# Patient Record
Sex: Female | Born: 1975
Health system: Southern US, Community
[De-identification: ages and names within clinical notes are randomized; demographics above are authoritative.]

## PROBLEM LIST (undated history)

## (undated) DIAGNOSIS — R202 Paresthesia of skin: Secondary | ICD-10-CM

## (undated) DIAGNOSIS — M2550 Pain in unspecified joint: Secondary | ICD-10-CM

## (undated) DIAGNOSIS — M7989 Other specified soft tissue disorders: Secondary | ICD-10-CM

## (undated) DIAGNOSIS — E785 Hyperlipidemia, unspecified: Secondary | ICD-10-CM

## (undated) DIAGNOSIS — M549 Dorsalgia, unspecified: Secondary | ICD-10-CM

## (undated) DIAGNOSIS — N979 Female infertility, unspecified: Secondary | ICD-10-CM

## (undated) DIAGNOSIS — N048 Nephrotic syndrome with other morphologic changes: Secondary | ICD-10-CM

## (undated) DIAGNOSIS — G4730 Sleep apnea, unspecified: Secondary | ICD-10-CM

## (undated) DIAGNOSIS — I10 Essential (primary) hypertension: Secondary | ICD-10-CM

## (undated) DIAGNOSIS — K760 Fatty (change of) liver, not elsewhere classified: Secondary | ICD-10-CM

## (undated) DIAGNOSIS — F329 Major depressive disorder, single episode, unspecified: Secondary | ICD-10-CM

## (undated) DIAGNOSIS — T7840XA Allergy, unspecified, initial encounter: Secondary | ICD-10-CM

## (undated) DIAGNOSIS — F439 Reaction to severe stress, unspecified: Secondary | ICD-10-CM

## (undated) DIAGNOSIS — M791 Myalgia, unspecified site: Secondary | ICD-10-CM

## (undated) DIAGNOSIS — R569 Unspecified convulsions: Secondary | ICD-10-CM

## (undated) DIAGNOSIS — D649 Anemia, unspecified: Secondary | ICD-10-CM

## (undated) DIAGNOSIS — G43909 Migraine, unspecified, not intractable, without status migrainosus: Secondary | ICD-10-CM

## (undated) DIAGNOSIS — R7303 Prediabetes: Secondary | ICD-10-CM

## (undated) DIAGNOSIS — B029 Zoster without complications: Secondary | ICD-10-CM

## (undated) DIAGNOSIS — K5900 Constipation, unspecified: Secondary | ICD-10-CM

## (undated) DIAGNOSIS — N289 Disorder of kidney and ureter, unspecified: Secondary | ICD-10-CM

## (undated) DIAGNOSIS — F32A Depression, unspecified: Secondary | ICD-10-CM

## (undated) DIAGNOSIS — R200 Anesthesia of skin: Secondary | ICD-10-CM

## (undated) DIAGNOSIS — R0602 Shortness of breath: Secondary | ICD-10-CM

## (undated) DIAGNOSIS — M1990 Unspecified osteoarthritis, unspecified site: Secondary | ICD-10-CM

## (undated) DIAGNOSIS — K219 Gastro-esophageal reflux disease without esophagitis: Secondary | ICD-10-CM

## (undated) DIAGNOSIS — E039 Hypothyroidism, unspecified: Secondary | ICD-10-CM

## (undated) DIAGNOSIS — E0590 Thyrotoxicosis, unspecified without thyrotoxic crisis or storm: Secondary | ICD-10-CM

## (undated) DIAGNOSIS — F419 Anxiety disorder, unspecified: Secondary | ICD-10-CM

## (undated) DIAGNOSIS — Z91018 Allergy to other foods: Secondary | ICD-10-CM

## (undated) HISTORY — DX: Disorder of kidney and ureter, unspecified: N28.9

## (undated) HISTORY — DX: Depression, unspecified: F32.A

## (undated) HISTORY — PX: KNEE SURGERY: SHX244

## (undated) HISTORY — DX: Allergy to other foods: Z91.018

## (undated) HISTORY — PX: OTHER SURGICAL HISTORY: SHX169

## (undated) HISTORY — DX: Constipation, unspecified: K59.00

## (undated) HISTORY — PX: TYMPANOSTOMY TUBE PLACEMENT: SHX32

## (undated) HISTORY — DX: Hypothyroidism, unspecified: E03.9

## (undated) HISTORY — DX: Anemia, unspecified: D64.9

## (undated) HISTORY — DX: Gastro-esophageal reflux disease without esophagitis: K21.9

## (undated) HISTORY — DX: Anesthesia of skin: R20.2

## (undated) HISTORY — DX: Reaction to severe stress, unspecified: F43.9

## (undated) HISTORY — DX: Fatty (change of) liver, not elsewhere classified: K76.0

## (undated) HISTORY — DX: Essential (primary) hypertension: I10

## (undated) HISTORY — PX: WRIST SURGERY: SHX841

## (undated) HISTORY — DX: Dorsalgia, unspecified: M54.9

## (undated) HISTORY — DX: Sleep apnea, unspecified: G47.30

## (undated) HISTORY — DX: Other specified soft tissue disorders: M79.89

## (undated) HISTORY — DX: Nephrotic syndrome with other morphologic changes: N04.8

## (undated) HISTORY — DX: Major depressive disorder, single episode, unspecified: F32.9

## (undated) HISTORY — DX: Zoster without complications: B02.9

## (undated) HISTORY — DX: Prediabetes: R73.03

## (undated) HISTORY — DX: Pain in unspecified joint: M25.50

## (undated) HISTORY — DX: Myalgia, unspecified site: M79.10

## (undated) HISTORY — DX: Anesthesia of skin: R20.0

## (undated) HISTORY — DX: Female infertility, unspecified: N97.9

## (undated) HISTORY — PX: COLONOSCOPY: SHX174

## (undated) HISTORY — DX: Anxiety disorder, unspecified: F41.9

## (undated) HISTORY — PX: RENAL BIOPSY: SHX156

## (undated) HISTORY — DX: Hyperlipidemia, unspecified: E78.5

## (undated) HISTORY — DX: Unspecified convulsions: R56.9

## (undated) HISTORY — PX: FINGER SURGERY: SHX640

## (undated) HISTORY — DX: Allergy, unspecified, initial encounter: T78.40XA

## (undated) HISTORY — DX: Shortness of breath: R06.02

---
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
## 2013-01-15 ENCOUNTER — Ambulatory Visit (INDEPENDENT_AMBULATORY_CARE_PROVIDER_SITE_OTHER): Payer: BC Managed Care – PPO | Admitting: Emergency Medicine

## 2013-01-15 VITALS — BP 120/82 | HR 73 | Temp 98.2°F | Resp 16 | Ht 68.8 in | Wt 278.0 lb

## 2013-01-15 DIAGNOSIS — R059 Cough, unspecified: Secondary | ICD-10-CM

## 2013-01-15 DIAGNOSIS — J209 Acute bronchitis, unspecified: Secondary | ICD-10-CM

## 2013-01-15 DIAGNOSIS — J029 Acute pharyngitis, unspecified: Secondary | ICD-10-CM

## 2013-01-15 DIAGNOSIS — R05 Cough: Secondary | ICD-10-CM

## 2013-01-15 MED ORDER — AZITHROMYCIN 250 MG PO TABS: ORAL_TABLET | ORAL | Status: DC

## 2013-01-15 MED ORDER — BENZONATATE 100 MG PO CAPS: 100.0000 mg | ORAL_CAPSULE | Freq: Three times a day (TID) | ORAL | Status: DC | PRN

## 2013-01-15 NOTE — Patient Instructions (Signed)

## 2013-01-15 NOTE — Progress Notes (Addendum)
Subjective:    Patient ID: Melanie [name], female    DOB: Feb 20, 1976, 37 y.o.   MRN: [id number]  HPI This chart was scribed for  by Ladona Ridgel Day, Scribe. This patient was seen in room  and the patient's care was started at 8:11 AM.  HPI Comments: Melanie [name] is a 37 y.o. female who presents to the [facility] complaining of a constant, gradually worsening cough and congestion, onset 10 days ago. She reports that her symptoms began with a mild sore throat and cough but worsened into nasal congestion and a productive cough. She states subjective fever. She reports taking tylenol cold. She states sick contacts with co workers and family. She teaches english to adult refugees.  She states that she usually gets seasonal allergy symptoms but this episode of congestion/cough seems worse than normal.   She is not a smoker. Allergic to sulfa, relafen and celebrex.     Past Medical History  Diagnosis Date  . Allergy   . Anxiety   . Depression   . Chronic kidney disease     History reviewed. No pertinent past surgical history.  Family History  Problem Relation Age of Onset  . Diabetes Mother   . Diabetes Maternal Grandmother   . Diabetes Paternal Grandmother   . Cancer Paternal Grandmother     History   Social History  . Marital Status: Married    Spouse Name: N/A    Number of Children: N/A  . Years of Education: N/A   Occupational History  . Not on file.   Social History Main Topics  . Smoking status: Passive Smoke Exposure - Never Smoker  . Smokeless tobacco: Not on file  . Alcohol Use: No  . Drug Use: No  . Sexual Activity: Not on file   Other Topics Concern  . Not on file   Social History Narrative  . No narrative on file    Allergies  Allergen Reactions  . Relafen [Nabumetone] Other (See Comments)    migraine  . Celebrex [Celecoxib] Rash  . Sulfa Antibiotics Rash    There are no active problems to display for this patient.   No results  found for this or any previous visit.  No diagnosis found.  Meds ordered this encounter  Medications  . DULoxetine (CYMBALTA) 30 MG capsule    Sig: Take 30 mg by mouth daily.    BP 120/82  Pulse 73  Temp(Src) 98.2 F (36.8 C) (Oral)  Resp 16  Ht 5' 8.8" (1.748 m)  Wt 278 lb (126.1 kg)  BMI 41.27 kg/m2  SpO2 95%  LMP 01/01/2013        Review of Systems  HENT: Positive for congestion.   Respiratory: Positive for cough. Negative for shortness of breath.   Gastrointestinal: Negative for abdominal pain.       Objective:   Physical Exam  Nursing note and vitals reviewed. Constitutional: She is oriented to person, place, and time. She appears well-developed and well-nourished. No distress.  HENT:  Head: Normocephalic and atraumatic.  Neck: Neck supple. No tracheal deviation present.  Cardiovascular: Normal rate.   Pulmonary/Chest: Effort normal. No respiratory distress.  Musculoskeletal: Normal range of motion.  Neurological: She is alert and oriented to person, place, and time.  Skin: Skin is warm and dry.  Psychiatric: She has a normal mood and affect. Her behavior is normal.   Are clear. Nose is congested throat is red neck supple chest was clear to auscultation and  percussion  Results for orders placed in visit on 01/15/13  POCT RAPID STREP A (OFFICE)      Result Value Range   Rapid Strep A Screen Negative  Negative       Assessment & Plan:  Strep screen was done. We'll treat with a Z-Pak Mucinex Tessalon Perles

## 2013-01-20 ENCOUNTER — Encounter: Payer: Self-pay | Admitting: Emergency Medicine

## 2013-03-17 ENCOUNTER — Ambulatory Visit (INDEPENDENT_AMBULATORY_CARE_PROVIDER_SITE_OTHER): Payer: BC Managed Care – PPO | Admitting: Emergency Medicine

## 2013-03-17 VITALS — BP 122/90 | HR 96 | Temp 98.0°F | Resp 16 | Ht 68.0 in | Wt 279.0 lb

## 2013-03-17 DIAGNOSIS — H66009 Acute suppurative otitis media without spontaneous rupture of ear drum, unspecified ear: Secondary | ICD-10-CM

## 2013-03-17 DIAGNOSIS — J018 Other acute sinusitis: Secondary | ICD-10-CM

## 2013-03-17 DIAGNOSIS — J209 Acute bronchitis, unspecified: Secondary | ICD-10-CM

## 2013-03-17 MED ORDER — AMOXICILLIN-POT CLAVULANATE 875-125 MG PO TABS: 1.0000 | ORAL_TABLET | Freq: Two times a day (BID) | ORAL | Status: DC

## 2013-03-17 MED ORDER — PROMETHAZINE-CODEINE 6.25-10 MG/5ML PO SYRP: 5.0000 mL | ORAL_SOLUTION | Freq: Four times a day (QID) | ORAL | Status: DC | PRN

## 2013-03-17 MED ORDER — PSEUDOEPHEDRINE-GUAIFENESIN ER 60-600 MG PO TB12: 1.0000 | ORAL_TABLET | Freq: Two times a day (BID) | ORAL | Status: DC

## 2013-03-17 NOTE — Progress Notes (Signed)
[facility] [address] [place] [address] [phone]  Date:  03/17/2013   Name:  Melanie [name]   DOB:  1975-11-30   MRN:  [id number]  PCP:  Kennon Portela, MD    Chief Complaint: Sinusitis   History of Present Illness:  Melanie [name] is a 38 y.o. very pleasant female patient who presents with the following:  Ill for past few days after travel to [place] for the holidays.  Has feeling of "fever" with normal temps of 96.  Has nasal congestion and drainage that is purulent in nature with congestion in ears and cough productive of purulent sputum.  No wheezing or shortness of breath.  No nausea or vomiting.  No rash or stool change.  No improvement with over the counter medications or other home remedies. Denies other complaint or health concern today.   There are no active problems to display for this patient.   Past Medical History  Diagnosis Date  . Allergy   . Anxiety   . Depression   . Chronic kidney disease     History reviewed. No pertinent past surgical history.  History  Substance Use Topics  . Smoking status: Passive Smoke Exposure - Never Smoker  . Smokeless tobacco: Not on file  . Alcohol Use: No    Family History  Problem Relation Age of Onset  . Diabetes Mother   . Diabetes Maternal Grandmother   . Diabetes Paternal Grandmother   . Cancer Paternal Grandmother     Allergies  Allergen Reactions  . Relafen [Nabumetone] Other (See Comments)    migraine  . Celebrex [Celecoxib] Rash  . Sulfa Antibiotics Rash    Medication list has been reviewed and updated.  Current Outpatient Prescriptions on File Prior to Visit  Medication Sig Dispense Refill  . benzonatate (TESSALON) 100 MG capsule Take 1-2 capsules (100-200 mg total) by mouth 3 (three) times daily as needed for cough.  40 capsule  0  . DULoxetine (CYMBALTA) 30 MG capsule Take 30 mg by mouth daily.      Marland Kitchen azithromycin (ZITHROMAX) 250 MG tablet Take 2 tabs PO x 1 dose,  then 1 tab PO QD x 4 days  6 tablet  0   No current facility-administered medications on file prior to visit.    Review of Systems:  As per HPI, otherwise negative.    Physical Examination: Filed Vitals:   03/17/13 0747  BP: 122/90  Pulse: 96  Temp: 98 F (36.7 C)  Resp: 16   Filed Vitals:   03/17/13 0747  Height: 5' 8"  (1.727 m)  Weight: 279 lb (126.554 kg)   Body mass index is 42.43 kg/(m^2). Ideal Body Weight: Weight in (lb) to have BMI = 25: 164.1  GEN: WDWN, NAD, Non-toxic, A & O x 3 HEENT: Atraumatic, Normocephalic. Neck supple. No masses, No LAD. Ears and Nose: No external deformity.  Left otitis media CV: RRR, No M/G/R. No JVD. No thrill. No extra heart sounds. PULM: CTA B, no wheezes, crackles, rhonchi. No retractions. No resp. distress. No accessory muscle use. ABD: S, NT, ND, +BS. No rebound. No HSM. EXTR: No c/c/e NEURO Normal gait.  PSYCH: Normally interactive. Conversant. Not depressed or anxious appearing.  Calm demeanor.    Assessment and Plan: Otitis media Sinusitis Bronchitis augmentin mucinex d Phen c cod  Signed,  Ellison Carwin, MD

## 2013-03-17 NOTE — Patient Instructions (Signed)

## 2013-04-01 ENCOUNTER — Encounter: Payer: Self-pay | Admitting: Emergency Medicine

## 2013-06-06 ENCOUNTER — Encounter: Payer: Self-pay | Admitting: Family Medicine

## 2013-06-06 ENCOUNTER — Ambulatory Visit: Payer: BC Managed Care – PPO

## 2013-06-06 ENCOUNTER — Ambulatory Visit (INDEPENDENT_AMBULATORY_CARE_PROVIDER_SITE_OTHER): Payer: BC Managed Care – PPO | Admitting: Family Medicine

## 2013-06-06 VITALS — BP 139/79 | HR 62 | Temp 98.2°F | Resp 16 | Ht 69.0 in | Wt 288.0 lb

## 2013-06-06 DIAGNOSIS — Z0000 Encounter for general adult medical examination without abnormal findings: Secondary | ICD-10-CM

## 2013-06-06 DIAGNOSIS — M79645 Pain in left finger(s): Secondary | ICD-10-CM

## 2013-06-06 DIAGNOSIS — N970 Female infertility associated with anovulation: Secondary | ICD-10-CM | POA: Insufficient documentation

## 2013-06-06 DIAGNOSIS — J3089 Other allergic rhinitis: Secondary | ICD-10-CM | POA: Insufficient documentation

## 2013-06-06 DIAGNOSIS — F341 Dysthymic disorder: Secondary | ICD-10-CM

## 2013-06-06 DIAGNOSIS — F418 Other specified anxiety disorders: Secondary | ICD-10-CM | POA: Insufficient documentation

## 2013-06-06 DIAGNOSIS — J309 Allergic rhinitis, unspecified: Secondary | ICD-10-CM

## 2013-06-06 DIAGNOSIS — M79609 Pain in unspecified limb: Secondary | ICD-10-CM

## 2013-06-06 LAB — COMPLETE METABOLIC PANEL WITH GFR
ALK PHOS: 158 U/L — AB (ref 39–117)
ALT: 127 U/L — ABNORMAL HIGH (ref 0–35)
AST: 110 U/L — ABNORMAL HIGH (ref 0–37)
Albumin: 4.1 g/dL (ref 3.5–5.2)
BUN: 10 mg/dL (ref 6–23)
CO2: 29 mEq/L (ref 19–32)
Calcium: 9.3 mg/dL (ref 8.4–10.5)
Chloride: 104 mEq/L (ref 96–112)
Creat: 0.68 mg/dL (ref 0.50–1.10)
GFR, Est African American: >89 mL/min
GFR, Est Non African American: >89 mL/min
Glucose, Bld: 112 mg/dL — ABNORMAL HIGH (ref 70–99)
Potassium: 4.4 mEq/L (ref 3.5–5.3)
SODIUM: 140 meq/L (ref 135–145)
TOTAL PROTEIN: 6.6 g/dL (ref 6.0–8.3)
Total Bilirubin: 1.2 mg/dL (ref 0.2–1.2)

## 2013-06-06 LAB — CBC WITH DIFFERENTIAL/PLATELET
Basophils Absolute: 0.1 10*3/uL (ref 0.0–0.1)
Basophils Relative: 1 % (ref 0–1)
Eosinophils Absolute: 0.3 10*3/uL (ref 0.0–0.7)
Eosinophils Relative: 4 % (ref 0–5)
HCT: 39.5 % (ref 36.0–46.0)
HEMOGLOBIN: 14.1 g/dL (ref 12.0–15.0)
Lymphocytes Relative: 35 % (ref 12–46)
Lymphs Abs: 2.7 10*3/uL (ref 0.7–4.0)
MCH: 31.5 pg (ref 26.0–34.0)
MCHC: 35.7 g/dL (ref 30.0–36.0)
MCV: 88.2 fL (ref 78.0–100.0)
MONOS PCT: 6 % (ref 3–12)
Monocytes Absolute: 0.5 10*3/uL (ref 0.1–1.0)
NEUTROS PCT: 54 % (ref 43–77)
Neutro Abs: 4.1 10*3/uL (ref 1.7–7.7)
PLATELETS: 244 10*3/uL (ref 150–400)
RBC: 4.48 MIL/uL (ref 3.87–5.11)
RDW: 13.3 % (ref 11.5–15.5)
WBC: 7.6 10*3/uL (ref 4.0–10.5)

## 2013-06-06 LAB — POCT URINALYSIS DIPSTICK
Bilirubin, UA: NEGATIVE
Glucose, UA: NEGATIVE
Ketones, UA: NEGATIVE
Leukocytes, UA: NEGATIVE
Nitrite, UA: NEGATIVE
PH UA: 5.5
Protein, UA: NEGATIVE
RBC UA: NEGATIVE
Spec Grav, UA: >=1.03
UROBILINOGEN UA: 0.2

## 2013-06-06 LAB — LIPID PANEL
Cholesterol: 236 mg/dL — ABNORMAL HIGH (ref 0–200)
HDL: 47 mg/dL (ref >39)
LDL Cholesterol: 162 mg/dL — ABNORMAL HIGH (ref 0–99)
Total CHOL/HDL Ratio: 5 Ratio
Triglycerides: 134 mg/dL (ref <150)
VLDL: 27 mg/dL (ref 0–40)

## 2013-06-06 LAB — POCT GLYCOSYLATED HEMOGLOBIN (HGB A1C): HEMOGLOBIN A1C: 5.7

## 2013-06-06 MED ORDER — DULOXETINE HCL 30 MG PO CPEP: 30.0000 mg | ORAL_CAPSULE | Freq: Every day | ORAL | Status: DC

## 2013-06-06 MED ORDER — MONTELUKAST SODIUM 10 MG PO TABS: 10.0000 mg | ORAL_TABLET | Freq: Every day | ORAL | Status: DC

## 2013-06-06 NOTE — Progress Notes (Signed)
Subjective:    Patient ID: Melanie [name], female    DOB: 1976/02/26, 38 y.o.   MRN: [id number]  HPI This 38 y.o. Cauc female is new to [facility]; appt is to establish care and she needs exam prior to adoption. Chronic medical problem includes anxiety/ depression, currently well controlled on Cymbalta x 6 months. Pt and husband have had evaluation for infertility; they are not able to conceive so have turned to adoption to start their family.  HCM: PAP- < 2 years ago.           MMG- Current (negative).           IMM- Current.  Review of Systems  HENT: Positive for congestion, rhinorrhea and sneezing. Negative for ear discharge, facial swelling, hearing loss, sinus pressure, tinnitus and trouble swallowing.        Has tried all OTC products but symptoms persist; since moving to [place], symptoms have increased.  Eyes: Negative.   Musculoskeletal:       Cyst on index finger > 3 months and increasing pain.  Allergic/Immunologic: Positive for environmental allergies.  Psychiatric/Behavioral: Negative for suicidal ideas, sleep disturbance, self-injury and dysphoric mood. The patient is nervous/anxious.   All other systems reviewed and are negative.      Objective:   Physical Exam  Nursing note and vitals reviewed. Constitutional: She is oriented to person, place, and time. Vital signs are normal. She appears well-developed and well-nourished. No distress.  HENT:  Head: Normocephalic and atraumatic.  Right Ear: Hearing, external ear and ear canal normal. Tympanic membrane is injected and erythematous. No middle ear effusion.  Left Ear: Hearing, external ear and ear canal normal. A middle ear effusion is present.  Nose: Mucosal edema present. No rhinorrhea, nasal deformity or septal deviation. Right sinus exhibits no maxillary sinus tenderness and no frontal sinus tenderness. Left sinus exhibits no frontal sinus tenderness.  Mouth/Throat: Uvula is midline and mucous membranes are normal. No oral  lesions. Normal dentition. No dental caries. Posterior oropharyngeal erythema present. No oropharyngeal exudate.  Eyes: Conjunctivae and EOM are normal. Pupils are equal, round, and reactive to light. Lids are everted and swept, no foreign bodies found. No scleral icterus.  Wears corrective lenses.  Neck: Trachea normal, normal range of motion and full passive range of motion without pain. Neck supple. No spinous process tenderness and no muscular tenderness present. No mass and no thyromegaly present.  Cardiovascular: Normal rate, regular rhythm, S1 normal, S2 normal, normal heart sounds and normal pulses.   No extrasystoles are present. PMI is not displaced.  Exam reveals no gallop and no friction rub.   No murmur heard. Pulmonary/Chest: Effort normal and breath sounds normal. No respiratory distress.  Abdominal: Soft. Normal appearance and bowel sounds are normal. She exhibits no distension and no mass. There is no hepatosplenomegaly. There is no tenderness. There is no guarding and no CVA tenderness.  Musculoskeletal:       Cervical back: Normal.       Thoracic back: Normal.       Lumbar back: Normal.  Index finger w/ tender cystic lesion at DIP joint.  Remainder of exam normal.  Lymphadenopathy:       Head (right side): No submental, no submandibular, no tonsillar, no preauricular, no posterior auricular and no occipital adenopathy present.       Head (left side): No submental, no submandibular, no tonsillar, no preauricular, no posterior auricular and no occipital adenopathy present.    She has no  cervical adenopathy.       Right: No inguinal and no supraclavicular adenopathy present.       Left: No inguinal and no supraclavicular adenopathy present.  Neurological: She is alert and oriented to person, place, and time. She has normal strength and normal reflexes. She displays no atrophy and no tremor. No cranial nerve deficit or sensory deficit. She exhibits normal muscle tone. She displays  a negative Romberg sign. Coordination and gait normal.  Skin: Skin is warm, dry and intact. No bruising, no petechiae and no rash noted. She is not diaphoretic. No cyanosis or erythema. No pallor. Nails show no clubbing.  Psychiatric: She has a normal mood and affect. Her speech is normal and behavior is normal. Judgment and thought content normal. Cognition and memory are normal.    Results for orders placed in visit on 06/06/13  POCT GLYCOSYLATED HEMOGLOBIN (HGB A1C)      Result Value Ref Range   Hemoglobin A1C 5.7    POCT URINALYSIS DIPSTICK      Result Value Ref Range   Color, UA yellow     Clarity, UA clear     Glucose, UA neg     Bilirubin, UA neg     Ketones, UA neg     Spec Grav, UA >=1.030     Blood, UA neg     pH, UA 5.5     Protein, UA neg     Urobilinogen, UA 0.2     Nitrite, UA neg     Leukocytes, UA Negative      [facility] reading (PRIMARY) by  Dr. Audria Nine; L index finger- No bony abnormality, fracture or dislocation. No soft tissue abnormality noted.     Assessment & Plan:  Routine general medical examination at a health care facility - Plan: POCT glycosylated hemoglobin (Hb A1C), POCT urinalysis dipstick, CBC with Differential, COMPLETE METABOLIC PANEL WITH GFR, Lipid panel  Depression with anxiety- Continue Cymbalta 30 mg 1 cap daily.  Finger pain, left - Plan: DG Finger Index Left, Ambulatory referral to Hand Surgery  Perennial allergic rhinitis- Trial Montelukast 10 mg 1 tab every evening. May continue to use OTC antihistamine and advised AYR saline nasal mist.  Meds ordered this encounter  Medications  . montelukast (SINGULAIR) 10 MG tablet    Sig: Take 1 tablet (10 mg total) by mouth at bedtime.    Dispense:  30 tablet    Refill:  5  . DULoxetine (CYMBALTA) 30 MG capsule    Sig: Take 1 capsule (30 mg total) by mouth daily.    Dispense:  30 capsule    Refill:  11

## 2013-06-07 ENCOUNTER — Telehealth: Payer: Self-pay | Admitting: Family Medicine

## 2013-06-07 ENCOUNTER — Encounter: Payer: Self-pay | Admitting: Family Medicine

## 2013-06-07 NOTE — Telephone Encounter (Signed)
I spoke w/ pt about elevated LFTs and gave her the results which were released to her via MyChart.  I suspect this may be due to Cymbalta which she has been taking for 6 months. It was prescribed by physician in [place]. She has no symptoms and takes Excedrin once a week for HA and OTC Claritin for allergies. I asked her to return on Tuesday for repeat LFTs and to discuss medication change. She agrees.

## 2013-06-10 ENCOUNTER — Ambulatory Visit (INDEPENDENT_AMBULATORY_CARE_PROVIDER_SITE_OTHER): Payer: BC Managed Care – PPO | Admitting: Family Medicine

## 2013-06-10 ENCOUNTER — Encounter: Payer: Self-pay | Admitting: Family Medicine

## 2013-06-10 VITALS — BP 120/82 | HR 63 | Temp 98.5°F | Resp 16 | Ht 68.5 in | Wt 284.6 lb

## 2013-06-10 DIAGNOSIS — F341 Dysthymic disorder: Secondary | ICD-10-CM

## 2013-06-10 DIAGNOSIS — R7989 Other specified abnormal findings of blood chemistry: Secondary | ICD-10-CM

## 2013-06-10 DIAGNOSIS — R945 Abnormal results of liver function studies: Principal | ICD-10-CM

## 2013-06-10 DIAGNOSIS — F418 Other specified anxiety disorders: Secondary | ICD-10-CM

## 2013-06-10 DIAGNOSIS — T887XXA Unspecified adverse effect of drug or medicament, initial encounter: Secondary | ICD-10-CM

## 2013-06-10 LAB — HEPATIC FUNCTION PANEL
ALK PHOS: 173 U/L — AB (ref 39–117)
ALT: 144 U/L — ABNORMAL HIGH (ref 0–35)
AST: 148 U/L — AB (ref 0–37)
Albumin: 4.7 g/dL (ref 3.5–5.2)
BILIRUBIN DIRECT: 0.3 mg/dL (ref 0.0–0.3)
BILIRUBIN INDIRECT: 1.7 mg/dL — AB (ref 0.2–1.2)
Total Bilirubin: 2 mg/dL — ABNORMAL HIGH (ref 0.2–1.2)
Total Protein: 7.4 g/dL (ref 6.0–8.3)

## 2013-06-10 MED ORDER — CITALOPRAM HYDROBROMIDE 20 MG PO TABS: 20.0000 mg | ORAL_TABLET | Freq: Every day | ORAL | Status: DC

## 2013-06-10 NOTE — Patient Instructions (Signed)
I have changed your medication; Cymbalta has been discontinued. New medication- Citalopram (generic Celexa) 20 mg 1 tablet daily. This medication is usually taken in the morning but you can take it in the evening if that suits your schedule better. I want to see you in 3 weeks. We may need to recheck your liver function tests again; consideration should be given to an abdominal ultrasound for full evaluation.

## 2013-06-10 NOTE — Progress Notes (Signed)
S:  This 38 y.o. Cauc female was seen last week as a new pt. Lab results indicated elevated LFTs; pt was notified and advised that this may be a side effect of Cymbalta. I spoke with pt over the weekend; she feels fine except for lack of adequate sleep due to job stressors. Her mother has end-stage liver disease of unknown etiology. Pt is willing to have medication changed; she has not taken any other anti-depressants.   Patient Active Problem List   Diagnosis Date Noted  . Depression with anxiety 06/06/2013  . Perennial allergic rhinitis 06/06/2013  . Infertility associated with anovulation 06/06/2013   PMHx, Surg Hx, Soc and Fam Hx reviewed. MEDICATIONS reconciled.  ROS; Negative fore abnormal weight change, anorexia, n/v/d, abd pain, jaundice, myalgias, rashes or other skin changes, HA, dizziness or weakness.  O: Filed Vitals:   06/10/13 1648  BP: 120/82  Pulse: 63  Temp: 98.5 F (36.9 C)  Resp: 16   GEN: In NAD; WN,WD. HENT: [place]/AT; EOMI w/ clear conj/sclerae. Otherwise unremarkable. COR: RRR. LUNGS: Unlabored resp. SKIN: W&D; intact w/o jaundice, erythema or rashes. NEURO: A&O x 3; CNs intact. Nonfocal.  Results for orders placed in visit on 06/06/13  CBC WITH DIFFERENTIAL      Result Value Ref Range   WBC 7.6  4.0 - 10.5 K/uL   RBC 4.48  3.87 - 5.11 MIL/uL   Hemoglobin 14.1  12.0 - 15.0 g/dL   HCT 39.5  36.0 - 46.0 %   MCV 88.2  78.0 - 100.0 fL   MCH 31.5  26.0 - 34.0 pg   MCHC 35.7  30.0 - 36.0 g/dL   RDW 13.3  11.5 - 15.5 %   Platelets 244  150 - 400 K/uL   Neutrophils Relative % 54  43 - 77 %   Neutro Abs 4.1  1.7 - 7.7 K/uL   Lymphocytes Relative 35  12 - 46 %   Lymphs Abs 2.7  0.7 - 4.0 K/uL   Monocytes Relative 6  3 - 12 %   Monocytes Absolute 0.5  0.1 - 1.0 K/uL   Eosinophils Relative 4  0 - 5 %   Eosinophils Absolute 0.3  0.0 - 0.7 K/uL   Basophils Relative 1  0 - 1 %   Basophils Absolute 0.1  0.0 - 0.1 K/uL   Smear Review Criteria for review not met     COMPLETE METABOLIC PANEL WITH GFR      Result Value Ref Range   Sodium 140  135 - 145 mEq/L   Potassium 4.4  3.5 - 5.3 mEq/L   Chloride 104  96 - 112 mEq/L   CO2 29  19 - 32 mEq/L   Glucose, Bld 112 (*) 70 - 99 mg/dL   BUN 10  6 - 23 mg/dL   Creat 0.68  0.50 - 1.10 mg/dL   Total Bilirubin 1.2  0.2 - 1.2 mg/dL   Alkaline Phosphatase 158 (*) 39 - 117 U/L   AST 110 (*) 0 - 37 U/L   ALT 127 (*) 0 - 35 U/L   Total Protein 6.6  6.0 - 8.3 g/dL   Albumin 4.1  3.5 - 5.2 g/dL   Calcium 9.3  8.4 - 10.5 mg/dL   GFR, Est African American >89     GFR, Est Non African American >89    LIPID PANEL      Result Value Ref Range   Cholesterol 236 (*) 0 - 200 mg/dL  Triglycerides 134  <150 mg/dL   HDL 47  >39 mg/dL   Total CHOL/HDL Ratio 5.0     VLDL 27  0 - 40 mg/dL   LDL Cholesterol 162 (*) 0 - 99 mg/dL  POCT GLYCOSYLATED HEMOGLOBIN (HGB A1C)      Result Value Ref Range   Hemoglobin A1C 5.7    POCT URINALYSIS DIPSTICK      Result Value Ref Range   Color, UA yellow     Clarity, UA clear     Glucose, UA neg     Bilirubin, UA neg     Ketones, UA neg     Spec Grav, UA >=1.030     Blood, UA neg     pH, UA 5.5     Protein, UA neg     Urobilinogen, UA 0.2     Nitrite, UA neg     Leukocytes, UA Negative      A/P: Elevated LFTs - Suspect medication related. Recheck LFTs. Plan: Hepatic Function Panel  Medication side effect- Discontinue Cymbalta.  Depression with anxiety- RX: Citalopram 20 mg 1 tablet daily.  Return in 3 weeks.

## 2013-06-11 ENCOUNTER — Other Ambulatory Visit: Payer: Self-pay | Admitting: Family Medicine

## 2013-06-11 ENCOUNTER — Encounter: Payer: Self-pay | Admitting: Family Medicine

## 2013-06-11 DIAGNOSIS — R945 Abnormal results of liver function studies: Principal | ICD-10-CM

## 2013-06-11 DIAGNOSIS — R7989 Other specified abnormal findings of blood chemistry: Secondary | ICD-10-CM

## 2013-06-13 ENCOUNTER — Encounter: Payer: Self-pay | Admitting: Family Medicine

## 2013-06-13 ENCOUNTER — Ambulatory Visit
Admission: RE | Admit: 2013-06-13 | Discharge: 2013-06-13 | Disposition: A | Discharge status: Home / Self Care | Payer: BC Managed Care – PPO | Source: Ambulatory Visit | Attending: Family Medicine | Admitting: Family Medicine

## 2013-06-13 DIAGNOSIS — R945 Abnormal results of liver function studies: Principal | ICD-10-CM

## 2013-06-13 DIAGNOSIS — R7989 Other specified abnormal findings of blood chemistry: Secondary | ICD-10-CM

## 2013-06-27 ENCOUNTER — Encounter: Payer: Self-pay | Admitting: Family Medicine

## 2013-06-27 ENCOUNTER — Ambulatory Visit (INDEPENDENT_AMBULATORY_CARE_PROVIDER_SITE_OTHER): Payer: BC Managed Care – PPO | Admitting: Family Medicine

## 2013-06-27 VITALS — BP 119/78 | HR 67 | Temp 98.3°F | Resp 16 | Ht 68.5 in | Wt 285.6 lb

## 2013-06-27 DIAGNOSIS — R7989 Other specified abnormal findings of blood chemistry: Secondary | ICD-10-CM

## 2013-06-27 DIAGNOSIS — R5381 Other malaise: Secondary | ICD-10-CM

## 2013-06-27 DIAGNOSIS — R945 Abnormal results of liver function studies: Principal | ICD-10-CM

## 2013-06-27 DIAGNOSIS — R5383 Other fatigue: Secondary | ICD-10-CM

## 2013-06-27 LAB — POCT URINALYSIS DIPSTICK
BILIRUBIN UA: NEGATIVE
Blood, UA: NEGATIVE
GLUCOSE UA: NEGATIVE
Ketones, UA: NEGATIVE
Leukocytes, UA: NEGATIVE
Nitrite, UA: NEGATIVE
Protein, UA: NEGATIVE
SPEC GRAV UA: 1.025
Urobilinogen, UA: 0.2
pH, UA: 5

## 2013-06-27 LAB — SEDIMENTATION RATE: SED RATE: 17 mm/h (ref 0–22)

## 2013-06-28 ENCOUNTER — Telehealth: Payer: Self-pay | Admitting: Family Medicine

## 2013-06-28 ENCOUNTER — Other Ambulatory Visit: Payer: Self-pay | Admitting: Family Medicine

## 2013-06-28 DIAGNOSIS — R945 Abnormal results of liver function studies: Principal | ICD-10-CM

## 2013-06-28 DIAGNOSIS — R7989 Other specified abnormal findings of blood chemistry: Secondary | ICD-10-CM

## 2013-06-28 DIAGNOSIS — Z8379 Family history of other diseases of the digestive system: Secondary | ICD-10-CM

## 2013-06-28 LAB — VITAMIN D 25 HYDROXY (VIT D DEFICIENCY, FRACTURES): VIT D 25 HYDROXY: 43 ng/mL (ref 30–89)

## 2013-06-28 NOTE — Telephone Encounter (Signed)
I phoned pt to let her know that LFTs are still rising but are only 3-5x normal. Discussed possible etiologies in addition to drug-induced liver injury; pt has been off Cymbalta > 2 weeks now. Hepatitis B immunization is current ; she travelled to [place] at age 38 and received all necessary vaccines. She has not travelled out of the country in last 6 months. Again reviewed the hx of illness last winter, lasting from Oct 2014 until early 2015. I will add additional tests (HCV antibody, screening for Hemachromatosis given mother's ESLD- etiology not known, antithyroid Ab) and refer to GI/ liver specialist. Pt understands and agrees.

## 2013-06-29 ENCOUNTER — Telehealth: Payer: Self-pay | Admitting: Family Medicine

## 2013-06-29 NOTE — Telephone Encounter (Signed)
Message copied by Kyra Manges on Sun Jun 29, 2013  5:03 PM ------      Message from: Barton Fanny      Created: Sun Jun 29, 2013  2:54 PM       I see- iron and TIBC pending.            I requested -serum transferrin saturation and ferritin.       Please check with reference lab to see if correct tests are in process.            Thank you. ------

## 2013-06-29 NOTE — Progress Notes (Signed)
 Subjective:    Patient ID: Melanie [name], female    DOB: 02/15/1976, 37 y.o.   MRN: [id number]  HPI  This 37 y.o. Cauc female returns today for repeat LFTs which were elevated and thought to be associated w/ Cymbalta. This medication was discontinued on 06/10/2013; treatment for depression initiated w/ Citalopram 20 mg daily. Pt feels that this medication is effective. She does c/o fatigue, attributed to working long hours/ work-related stress. Having mild myalgias.  Pt gives hx of bronchitis, onset Oct 2014, "hard to shake". Still felt bad at Christmas with chronic cough and body aches. Treated for ear infection and recurrent bronchitis in Jan 2015 with Z-PAK then Amoxicillin. Finally felt better in March but now feels tired and has muscle aches. No fever/chills, diaphoresis, sore throat, CP or cough, SOB or DOE, n/v/d, rashes, HA or dizziness. She denies recent travel outside the [place].   Hepatitis B immunization is current; pt states she received vaccine at age 19 before travel to [place].  Family hx significant for mother's diagnosis of End-Stage Liver Disease of unknown etiology.  Mother has had extensive work-up but pt does not have details of that. Mother is on the transplant list. Mother also has DM. Parents reside in [place].  Upon further questioning after review of abdominal ultrasound, pt reports some food intolerances and a feeling of "hollowness" after eating. She denies abdominal pain or cramping and stools are normal.  Patient Active Problem List   Diagnosis Date Noted  . Depression with anxiety 06/06/2013  . Perennial allergic rhinitis 06/06/2013  . Infertility associated with anovulation 06/06/2013   Prior to Admission medications   Medication Sig Start Date End Date Taking? Authorizing Provider  citalopram (CELEXA) 20 MG tablet Take 1 tablet (20 mg total) by mouth daily. 06/10/13  Yes Barbara B McPherson, MD  montelukast (SINGULAIR) 10 MG tablet Take 1  tablet (10 mg total) by mouth at bedtime. 06/06/13  Yes Barbara B McPherson, MD   PMHx, Surg Hx, Soc and Fam Hx reviewed. Pt does not consume alcohol.   Review of Systems  Constitutional: Positive for fatigue. Negative for fever, chills, diaphoresis and unexpected weight change.  HENT: Negative for mouth sores, nosebleeds, sore throat and trouble swallowing.   Eyes: Negative.   Respiratory: Negative.   Cardiovascular: Negative.   Endocrine: Negative for heat intolerance.  Musculoskeletal: Positive for myalgias. Negative for arthralgias.  Skin: Negative.   Allergic/Immunologic: Positive for environmental allergies.  Neurological: Negative.   Hematological: Negative.   Psychiatric/Behavioral: Positive for dysphoric mood. Negative for suicidal ideas, confusion, sleep disturbance, self-injury and decreased concentration. The patient is nervous/anxious.       Objective:   Physical Exam  Nursing note and vitals reviewed. Constitutional: She is oriented to person, place, and time. She appears well-developed and well-nourished. No distress.  HENT:  Head: Normocephalic and atraumatic.  Right Ear: External ear normal.  Left Ear: External ear normal.  Nose: Nose normal.  Mouth/Throat: Oropharynx is clear and moist. No oropharyngeal exudate.  Eyes: Conjunctivae and EOM are normal. Pupils are equal, round, and reactive to light. No scleral icterus.  Neck: Neck supple. No thyromegaly present.  Cardiovascular: Normal rate and regular rhythm.   Pulmonary/Chest: Effort normal. No respiratory distress.  Abdominal: Soft. Normal appearance and bowel sounds are normal. She exhibits no distension and no mass. There is no hepatosplenomegaly. There is tenderness in the epigastric area. There is no guarding, no CVA tenderness and negative Murphy's sign.  Musculoskeletal: Normal    Subjective:    Patient ID: Melanie [name], female    DOB: 01/24/1976, 37 y.o.   MRN: [id number]  HPI  This 37 y.o. Cauc female returns today for repeat LFTs which were elevated and thought to be associated w/ Cymbalta. This medication was discontinued on 06/10/2013; treatment for depression initiated w/ Citalopram 20 mg daily. Pt feels that this medication is effective. She does c/o fatigue, attributed to working long hours/ work-related stress. Having mild myalgias.  Pt gives hx of bronchitis, onset Oct 2014, "hard to shake". Still felt bad at Christmas with chronic cough and body aches. Treated for ear infection and recurrent bronchitis in Jan 2015 with Z-PAK then Amoxicillin. Finally felt better in March but now feels tired and has muscle aches. No fever/chills, diaphoresis, sore throat, CP or cough, SOB or DOE, n/v/d, rashes, HA or dizziness. She denies recent travel outside the [place].   Hepatitis B immunization is current; pt states she received vaccine at age 19 before travel to [place].  Family hx significant for mother's diagnosis of End-Stage Liver Disease of unknown etiology.  Mother has had extensive work-up but pt does not have details of that. Mother is on the transplant list. Mother also has DM. Parents reside in [place].  Upon further questioning after review of abdominal ultrasound, pt reports some food intolerances and a feeling of "hollowness" after eating. She denies abdominal pain or cramping and stools are normal.  Patient Active Problem List   Diagnosis Date Noted  . Depression with anxiety 06/06/2013  . Perennial allergic rhinitis 06/06/2013  . Infertility associated with anovulation 06/06/2013   Prior to Admission medications   Medication Sig Start Date End Date Taking? Authorizing Provider  citalopram (CELEXA) 20 MG tablet Take 1 tablet (20 mg total) by mouth daily. 06/10/13  Yes Aurilla Coulibaly B Chandra Asher, MD  montelukast (SINGULAIR) 10 MG tablet Take 1  tablet (10 mg total) by mouth at bedtime. 06/06/13  Yes Adi Doro B Kentrell Hallahan, MD   PMHx, Surg Hx, Soc and Fam Hx reviewed. Pt does not consume alcohol.   Review of Systems  Constitutional: Positive for fatigue. Negative for fever, chills, diaphoresis and unexpected weight change.  HENT: Negative for mouth sores, nosebleeds, sore throat and trouble swallowing.   Eyes: Negative.   Respiratory: Negative.   Cardiovascular: Negative.   Endocrine: Negative for heat intolerance.  Musculoskeletal: Positive for myalgias. Negative for arthralgias.  Skin: Negative.   Allergic/Immunologic: Positive for environmental allergies.  Neurological: Negative.   Hematological: Negative.   Psychiatric/Behavioral: Positive for dysphoric mood. Negative for suicidal ideas, confusion, sleep disturbance, self-injury and decreased concentration. The patient is nervous/anxious.       Objective:   Physical Exam  Nursing note and vitals reviewed. Constitutional: She is oriented to person, place, and time. She appears well-developed and well-nourished. No distress.  HENT:  Head: Normocephalic and atraumatic.  Right Ear: External ear normal.  Left Ear: External ear normal.  Nose: Nose normal.  Mouth/Throat: Oropharynx is clear and moist. No oropharyngeal exudate.  Eyes: Conjunctivae and EOM are normal. Pupils are equal, round, and reactive to light. No scleral icterus.  Neck: Neck supple. No thyromegaly present.  Cardiovascular: Normal rate and regular rhythm.   Pulmonary/Chest: Effort normal. No respiratory distress.  Abdominal: Soft. Normal appearance and bowel sounds are normal. She exhibits no distension and no mass. There is no hepatosplenomegaly. There is tenderness in the epigastric area. There is no guarding, no CVA tenderness and negative Murphy's sign.  Musculoskeletal: Normal

## 2013-06-29 NOTE — Telephone Encounter (Signed)
I spoke to [place] at [facility] and she said that the Iron and TIBC is the same thing in there system as the serum transferrin saturation. She said that the Iron and TIBC run Iron, TIBC, UIBC, %saturation. She cannot order just the transferrin saturation. She can order just the Transferrin. Please advise

## 2013-06-30 LAB — ANA: Anti Nuclear Antibody(ANA): NEGATIVE

## 2013-06-30 NOTE — Telephone Encounter (Signed)
Thanks Morey Hummingbird; I will just wait for the results.

## 2013-07-01 LAB — IRON AND TIBC
%SAT: 26 % (ref 20–55)
Iron: 95 ug/dL (ref 42–145)
TIBC: 371 ug/dL (ref 250–470)
UIBC: 276 ug/dL (ref 125–400)

## 2013-07-01 LAB — THYROID PANEL WITH TSH
FREE THYROXINE INDEX: 3.4 (ref 1.0–3.9)
T3 UPTAKE: 29.2 % (ref 22.5–37.0)
T4, Total: 11.8 ug/dL (ref 5.0–12.5)
TSH: 4.529 u[IU]/mL — ABNORMAL HIGH (ref 0.350–4.500)

## 2013-07-01 LAB — COMPREHENSIVE METABOLIC PANEL
ALK PHOS: 195 U/L — AB (ref 39–117)
ALT: 182 U/L — AB (ref 0–35)
AST: 163 U/L — ABNORMAL HIGH (ref 0–37)
Albumin: 4.6 g/dL (ref 3.5–5.2)
BILIRUBIN TOTAL: 1.9 mg/dL — AB (ref 0.2–1.2)
BUN: 9 mg/dL (ref 6–23)
CO2: 26 mEq/L (ref 19–32)
CREATININE: 0.61 mg/dL (ref 0.50–1.10)
Calcium: 10 mg/dL (ref 8.4–10.5)
Chloride: 98 mEq/L (ref 96–112)
GLUCOSE: 96 mg/dL (ref 70–99)
Potassium: 4.3 mEq/L (ref 3.5–5.3)
SODIUM: 135 meq/L (ref 135–145)
TOTAL PROTEIN: 7.3 g/dL (ref 6.0–8.3)

## 2013-07-01 LAB — THYROID ANTIBODIES
Thyroglobulin Ab: 45.3 U/mL — ABNORMAL HIGH (ref <40.0)
Thyroperoxidase Ab SerPl-aCnc: <10 IU/mL (ref <35.0)

## 2013-07-01 LAB — FERRITIN: FERRITIN: 401 ng/mL — AB (ref 10–291)

## 2013-07-01 LAB — HEPATITIS C ANTIBODY: HCV Ab: NEGATIVE

## 2013-07-02 ENCOUNTER — Encounter: Payer: Self-pay | Admitting: Family Medicine

## 2013-07-02 ENCOUNTER — Encounter: Payer: Self-pay | Admitting: Gastroenterology

## 2013-07-09 ENCOUNTER — Encounter: Payer: Self-pay | Admitting: Family Medicine

## 2013-07-11 ENCOUNTER — Ambulatory Visit (INDEPENDENT_AMBULATORY_CARE_PROVIDER_SITE_OTHER): Payer: BC Managed Care – PPO | Admitting: Family Medicine

## 2013-07-11 VITALS — BP 119/73 | HR 72 | Temp 96.8°F | Resp 16 | Ht 68.0 in | Wt 286.0 lb

## 2013-07-11 DIAGNOSIS — R7989 Other specified abnormal findings of blood chemistry: Secondary | ICD-10-CM

## 2013-07-11 DIAGNOSIS — R945 Abnormal results of liver function studies: Principal | ICD-10-CM

## 2013-07-11 DIAGNOSIS — M25569 Pain in unspecified knee: Secondary | ICD-10-CM

## 2013-07-11 LAB — HEPATIC FUNCTION PANEL
ALBUMIN: 4.3 g/dL (ref 3.5–5.2)
ALK PHOS: 162 U/L — AB (ref 39–117)
ALT: 108 U/L — ABNORMAL HIGH (ref 0–35)
AST: 111 U/L — ABNORMAL HIGH (ref 0–37)
Bilirubin, Direct: 0.2 mg/dL (ref 0.0–0.3)
Indirect Bilirubin: 1.1 mg/dL (ref 0.2–1.2)
Total Bilirubin: 1.3 mg/dL — ABNORMAL HIGH (ref 0.2–1.2)
Total Protein: 6.9 g/dL (ref 6.0–8.3)
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
## 2013-07-11 NOTE — Patient Instructions (Signed)
Knee Pain Knee pain can be a result of an injury or other medical conditions. Treatment will depend on the cause of your pain. HOME CARE  Only take medicine as told by your doctor.  Keep a healthy weight. Being overweight can make the knee hurt more.  Stretch before exercising or playing sports.  If there is constant knee pain, change the way you exercise. Ask your doctor for advice.  Make sure shoes fit well. Choose the right shoe for the sport or activity.  Protect your knees. Wear kneepads if needed.  Rest when you are tired. GET HELP RIGHT AWAY IF:   Your knee pain does not stop.  Your knee pain does not get better.  Your knee joint feels hot to the touch.  You have a fever. MAKE SURE YOU:   Understand these instructions.  Will watch this condition.  Will get help right away if you are not doing well or get worse. Document Released: 05/26/2008 Document Revised: 05/22/2011 Document Reviewed: 05/26/2008 [facility] Patient Information 2014 [place], [place].   If your knee gets worse before Jul 20, 2013 (when Dr. Carlota Raspberry is working next door at 102:10 am- 6 pm), send me a message and we will try to get you in with Dr. Louanne Skye ([place]) ASAP.  You will know about your liver tests before Sunday.

## 2013-07-12 ENCOUNTER — Telehealth: Payer: Self-pay | Admitting: Family Medicine

## 2013-07-12 DIAGNOSIS — R7989 Other specified abnormal findings of blood chemistry: Secondary | ICD-10-CM

## 2013-07-12 DIAGNOSIS — R945 Abnormal results of liver function studies: Principal | ICD-10-CM

## 2013-07-12 NOTE — Telephone Encounter (Signed)
I phoned pt to review LFTs results w/ her. Numbers are coming down and are almost where they were in late March. She will come in for another Hepatic panel In ~ 2 weeks. We agree that Cymbalta may have been the culprit, especially if her numbers continue to return to near normal. If they are continuing to decline, GI referral may be cancelled.

## 2013-07-14 ENCOUNTER — Encounter: Payer: Self-pay | Admitting: Family Medicine

## 2013-08-18 ENCOUNTER — Encounter: Payer: Self-pay | Admitting: Family Medicine

## 2013-08-19 ENCOUNTER — Other Ambulatory Visit (INDEPENDENT_AMBULATORY_CARE_PROVIDER_SITE_OTHER): Payer: BC Managed Care – PPO

## 2013-08-19 DIAGNOSIS — R799 Abnormal finding of blood chemistry, unspecified: Secondary | ICD-10-CM

## 2013-08-19 DIAGNOSIS — R7989 Other specified abnormal findings of blood chemistry: Secondary | ICD-10-CM

## 2013-08-19 NOTE — Progress Notes (Signed)
Pt here for lab only

## 2013-08-20 ENCOUNTER — Telehealth: Payer: Self-pay | Admitting: Family Medicine

## 2013-08-20 ENCOUNTER — Encounter: Payer: Self-pay | Admitting: Family Medicine

## 2013-08-20 ENCOUNTER — Encounter: Payer: Self-pay | Admitting: Gastroenterology

## 2013-08-20 LAB — HEPATIC FUNCTION PANEL
ALT: 141 U/L — ABNORMAL HIGH (ref 0–35)
AST: 113 U/L — ABNORMAL HIGH (ref 0–37)
Albumin: 4.8 g/dL (ref 3.5–5.2)
Alkaline Phosphatase: 171 U/L — ABNORMAL HIGH (ref 39–117)
BILIRUBIN INDIRECT: 1.1 mg/dL (ref 0.2–1.2)
Bilirubin, Direct: 0.3 mg/dL (ref 0.0–0.3)
TOTAL PROTEIN: 7.4 g/dL (ref 6.0–8.3)
Total Bilirubin: 1.4 mg/dL — ABNORMAL HIGH (ref 0.2–1.2)

## 2013-08-20 NOTE — Telephone Encounter (Signed)
I spoke w/ pt above elevated LFTs; the results are about the same as 1 month ago. Pt has an appt sch for 08/22/13 w/ GI specialist but has another obligation. She states she will resch appt w/ GI. Otherwise, she feels well and is getting more rest.

## 2013-08-22 ENCOUNTER — Ambulatory Visit: Payer: BC Managed Care – PPO | Admitting: Gastroenterology

## 2013-09-29 ENCOUNTER — Encounter: Payer: Self-pay | Admitting: Family Medicine

## 2013-09-30 ENCOUNTER — Other Ambulatory Visit: Payer: Self-pay | Admitting: Family Medicine

## 2013-09-30 MED ORDER — CITALOPRAM HYDROBROMIDE 20 MG PO TABS: 20.0000 mg | ORAL_TABLET | Freq: Every day | ORAL | Status: DC

## 2013-10-27 ENCOUNTER — Other Ambulatory Visit (INDEPENDENT_AMBULATORY_CARE_PROVIDER_SITE_OTHER): Payer: BC Managed Care – PPO

## 2013-10-27 ENCOUNTER — Encounter: Payer: Self-pay | Admitting: Gastroenterology

## 2013-10-27 ENCOUNTER — Ambulatory Visit (INDEPENDENT_AMBULATORY_CARE_PROVIDER_SITE_OTHER): Payer: BC Managed Care – PPO | Admitting: Gastroenterology

## 2013-10-27 VITALS — BP 120/72 | HR 76 | Ht 68.0 in | Wt 291.0 lb

## 2013-10-27 DIAGNOSIS — R748 Abnormal levels of other serum enzymes: Secondary | ICD-10-CM

## 2013-10-27 DIAGNOSIS — R945 Abnormal results of liver function studies: Secondary | ICD-10-CM

## 2013-10-27 LAB — CBC WITH DIFFERENTIAL/PLATELET
BASOS PCT: 0.2 % (ref 0.0–3.0)
Basophils Absolute: 0 10*3/uL (ref 0.0–0.1)
EOS PCT: 3.1 % (ref 0.0–5.0)
Eosinophils Absolute: 0.3 10*3/uL (ref 0.0–0.7)
HCT: 40.5 % (ref 36.0–46.0)
Hemoglobin: 14 g/dL (ref 12.0–15.0)
LYMPHS PCT: 35.4 % (ref 12.0–46.0)
Lymphs Abs: 3.1 10*3/uL (ref 0.7–4.0)
MCHC: 34.5 g/dL (ref 30.0–36.0)
MCV: 91.9 fl (ref 78.0–100.0)
Monocytes Absolute: 0.4 10*3/uL (ref 0.1–1.0)
Monocytes Relative: 4.8 % (ref 3.0–12.0)
NEUTROS PCT: 56.5 % (ref 43.0–77.0)
Neutro Abs: 4.9 10*3/uL (ref 1.4–7.7)
Platelets: 247 10*3/uL (ref 150.0–400.0)
RBC: 4.4 Mil/uL (ref 3.87–5.11)
RDW: 13.3 % (ref 11.5–15.5)
WBC: 8.6 10*3/uL (ref 4.0–10.5)

## 2013-10-27 LAB — PROTIME-INR
INR: 1.1 ratio — AB (ref 0.8–1.0)
Prothrombin Time: 11.8 s (ref 9.6–13.1)

## 2013-10-27 NOTE — Patient Instructions (Signed)
Go to the basement for labs today We will contact you with the results

## 2013-10-27 NOTE — Progress Notes (Signed)
_                                                                                                                History of Present Illness:  Melanie [name] is a 38 year old white female referred for evaluation of liver tests abnormalities.  These have been noted over the past 6 months.  Alkaline phosphatase has ranged from 158-195, AST up to 163, and ALT 182 and bilirubin 1.3-1.9.  Iron studies and ANA are unremarkable and she is hepatitis c antibody negative.  Abdominal ultrasound, which I reviewed, demonstrated a slightly hyper echogenic liver.  Family history is pertinent for mother who has end-stage liver disease of unknown etiology.  Patient does not drink nor is there a drug history.  She did start SSRIs approximately 6 months ago.  There is which because of liver tests abnormalities but have remained abnormal.  Patient feels well and has no GI complaints including abdominal pain or pruritus.   Past Medical History  Diagnosis Date  . Allergy   . Anxiety   . Depression   . Membranous nephrosis    Past Surgical History  Procedure Laterality Date  . Knee surgery Left   . Wrist cyst Left   . Finger surgery Left      index finger  . Tonsillectomy and adenoidectomy  1978  . Tympanostomy tube placement     family history includes Breast cancer in her paternal grandmother; Diabetes in her maternal grandmother, mother, and paternal grandmother; Liver disease in her mother; Stroke in her maternal grandfather. Current Outpatient Prescriptions  Medication Sig Dispense Refill  . B Complex Vitamins (VITAMIN-B COMPLEX PO) Take 1 tablet by mouth daily.      . citalopram (CELEXA) 20 MG tablet Take 1 tablet (20 mg total) by mouth daily.  30 tablet  5  . loratadine (CLARITIN) 10 MG tablet Take 10 mg by mouth daily.      . montelukast (SINGULAIR) 10 MG tablet Take 1 tablet (10 mg total) by mouth at bedtime.  30 tablet  5  . Omega-3 Fatty Acids (FISH OIL PO) Take 1 tablet by mouth daily.        No current facility-administered medications for this visit.   Allergies as of 10/27/2013 - Review Complete 10/27/2013  Allergen Reaction Noted  . Lidocaine Other (See Comments) 10/27/2013  . Relafen [nabumetone] Other (See Comments) 01/15/2013  . Celebrex [celecoxib] Rash 01/15/2013  . Sulfa antibiotics Rash 01/15/2013    reports that she has never smoked. She has never used smokeless tobacco. She reports that she does not drink alcohol or use illicit drugs.   Review of Systems: Pertinent positive and negative review of systems were noted in the above HPI section. All other review of systems were otherwise negative.  Vital signs were reviewed in today's medical record Physical Exam: General: Obese female in no acute distress Skin: anicteric Head: Normocephalic and atraumatic Eyes:  sclerae anicteric, EOMI Ears: Normal auditory acuity Mouth: No deformity or lesions Neck: Supple, no  masses or thyromegaly Lungs: Clear throughout to auscultation Heart: Regular rate and rhythm; no murmurs, rubs or bruits Abdomen: Soft, non tender and non distended. No masses, hepatosplenomegaly or hernias noted. Normal Bowel sounds Rectal:deferred Musculoskeletal: Symmetrical with no gross deformities  Skin: No lesions on visible extremities Pulses:  Normal pulses noted Extremities: No clubbing, cyanosis, edema or deformities noted Neurological: Alert oriented x 4, grossly nonfocal Cervical Nodes:  No significant cervical adenopathy Inguinal Nodes: No significant inguinal adenopathy Psychological:  Alert and cooperative. Normal mood and affect  See Assessment and Plan under Problem List

## 2013-10-27 NOTE — Assessment & Plan Note (Signed)
Patient has a 6 month history of  liver tests abnormalities with a mixed medical auditory and cholestatic pattern.  Drug-induced hepatitis is a consideration.  Congenital liver disease is also be considered in view of family history of mother having end-stage liver disease of unknown etiology.  Recommendations #1 check CBC, INR, antimitochondrial antibody, ceruloplasmin level, alpha-1 antitrypsin level, serologies for hepatitis A. and B.  #2 if blood work is unremarkable I would consider holding Celexa for several weeks and following LFTs #3 to consider percutaneous liver biopsy pending results of above

## 2013-10-28 LAB — HEPATITIS B SURFACE ANTIGEN: Hepatitis B Surface Ag: NEGATIVE

## 2013-10-28 LAB — HEPATITIS B SURFACE ANTIBODY,QUALITATIVE: HEP B S AB: NEGATIVE

## 2013-10-28 LAB — HEPATITIS C ANTIBODY: HCV AB: NEGATIVE

## 2013-10-28 LAB — HEPATITIS A ANTIBODY, TOTAL: HEP A TOTAL AB: REACTIVE — AB

## 2013-10-29 LAB — MITOCHONDRIAL ANTIBODIES: Mitochondrial M2 Ab, IgG: 0.1 (ref <0.91)

## 2013-10-30 LAB — CERULOPLASMIN: Ceruloplasmin: 38 mg/dL (ref 18–53)

## 2013-10-30 LAB — ALPHA-1-ANTITRYPSIN: A-1 Antitrypsin, Ser: 110 mg/dL (ref 83–199)

## 2013-10-31 ENCOUNTER — Telehealth: Payer: Self-pay | Admitting: Radiology

## 2013-10-31 ENCOUNTER — Encounter: Payer: Self-pay | Admitting: Gastroenterology

## 2013-10-31 ENCOUNTER — Telehealth: Payer: Self-pay

## 2013-10-31 NOTE — Telephone Encounter (Signed)
Patient instructed. She will let us know as soon as she gets her instructions to taper and then discontinue Celexa.

## 2013-10-31 NOTE — Telephone Encounter (Signed)
Message copied by Greggory Keen on Fri Oct 31, 2013 10:08 AM ------      Message from: Erskine Emery D      Created: Fri Oct 31, 2013  9:49 AM       Eustaquio Maize,      Please inform the patient that her blood test were all normal.  We need to taper and discontinue Celexa and then repeat liver tests in approximately 4 weeks after discontinuation.  Instruct patient to contact Dr. Leward Quan for instructions how she would like her to taper then discontinue Celexa.  Once she is off Celexa ask her to contact our office where we can arrange for liver tests in approximately 4 weeks thereafter.  She should also have an office visit around that time as well. ------

## 2013-10-31 NOTE — Progress Notes (Unsigned)
Patient ID: Melanie [name], female   DOB: October 08, 1975, 38 y.o.   MRN: [id number] Blood tests looking for etiologies of chronic liver disease are negative.  A shunt is on Celexa.  Will wean then discontinue Celexa and repeat liver tests in approximately 4 weeks

## 2013-10-31 NOTE — Telephone Encounter (Signed)
Dr Deatra Ina has seen patient for elevated LFT's and would like for [place] to d/c the patients Celexa to see if this helps get back to normal range. Patient is aware you are out of the office until Tuesday and we will call with a taper schedule, and determine if she needs another medication substituted.

## 2013-11-01 NOTE — Telephone Encounter (Signed)
I phoned pt and left message re: Citalopram tapering to 1/2 tablet daily = 10 mg. I will contact her next week when I am back in the clinic to discuss other treatment options.

## 2013-11-04 ENCOUNTER — Encounter: Payer: Self-pay | Admitting: Family Medicine

## 2013-11-04 NOTE — Telephone Encounter (Signed)
I phoned pt to talk w/ her abut Citalopram dose reduction. Advised she take 1/2 tablet daily (= 10 mg). She can contact me via MyChart if she is unable to return my phone calls during her work day. I will try to contact her again tomorrow or Thursday.

## 2013-11-07 ENCOUNTER — Encounter: Payer: Self-pay | Admitting: Family Medicine

## 2013-11-13 ENCOUNTER — Encounter: Payer: Self-pay | Admitting: Family Medicine

## 2013-11-14 ENCOUNTER — Other Ambulatory Visit: Payer: Self-pay | Admitting: Family Medicine

## 2013-11-14 MED ORDER — L-METHYLFOLATE-B6-B12 3-35-2 MG PO TABS: 1.0000 | ORAL_TABLET | Freq: Two times a day (BID) | ORAL | Status: DC

## 2013-11-27 ENCOUNTER — Encounter: Payer: Self-pay | Admitting: Family Medicine

## 2013-11-29 ENCOUNTER — Other Ambulatory Visit: Payer: Self-pay | Admitting: Family Medicine

## 2013-11-29 MED ORDER — FLUTICASONE PROPIONATE 50 MCG/ACT NA SUSP: 2.0000 | Freq: Every day | NASAL | Status: DC

## 2013-11-29 MED ORDER — MONTELUKAST SODIUM 10 MG PO TABS: 10.0000 mg | ORAL_TABLET | Freq: Every day | ORAL | Status: DC

## 2014-02-16 ENCOUNTER — Encounter: Payer: Self-pay | Admitting: Gastroenterology

## 2014-02-16 ENCOUNTER — Ambulatory Visit: Payer: BC Managed Care – PPO | Admitting: Gastroenterology

## 2014-02-16 ENCOUNTER — Other Ambulatory Visit: Payer: BC Managed Care – PPO

## 2014-02-16 ENCOUNTER — Ambulatory Visit (INDEPENDENT_AMBULATORY_CARE_PROVIDER_SITE_OTHER): Payer: BC Managed Care – PPO | Admitting: Gastroenterology

## 2014-02-16 VITALS — BP 122/72 | HR 76 | Ht 68.0 in | Wt 300.2 lb

## 2014-02-16 DIAGNOSIS — R945 Abnormal results of liver function studies: Principal | ICD-10-CM

## 2014-02-16 DIAGNOSIS — R7989 Other specified abnormal findings of blood chemistry: Secondary | ICD-10-CM

## 2014-02-16 LAB — HEPATIC FUNCTION PANEL
ALT: 114 U/L — ABNORMAL HIGH (ref 0–35)
AST: 101 U/L — ABNORMAL HIGH (ref 0–37)
Albumin: 4.3 g/dL (ref 3.5–5.2)
Alkaline Phosphatase: 167 U/L — ABNORMAL HIGH (ref 39–117)
BILIRUBIN TOTAL: 1.4 mg/dL — AB (ref 0.2–1.2)
Bilirubin, Direct: 0.2 mg/dL (ref 0.0–0.3)
Total Protein: 7.5 g/dL (ref 6.0–8.3)

## 2014-02-16 NOTE — Progress Notes (Signed)
      History of Present Illness:  Melanie [name] has returned for follow-up of abnormal liver tests.  She has been off Celexa for about one month.  She continues to feel well.  Blood work was negative except for a total hepatitis A antibody level that was elevated.    Review of Systems: Pertinent positive and negative review of systems were noted in the above HPI section. All other review of systems were otherwise negative.    Current Medications, Allergies, Past Medical History, Past Surgical History, Family History and Social History were reviewed in [facility] record  Vital signs were reviewed in today's medical record. Physical Exam: General: Obese female in no acute distress Abdomen is without masses, tenderness, organomegaly.  Liver is palpable at the right costal margin  See Assessment and Plan under Problem List

## 2014-02-16 NOTE — Patient Instructions (Signed)
Go to the basement today for labs

## 2014-02-16 NOTE — Assessment & Plan Note (Signed)
Asymptomatic.  Total hepatitis A antibody was elevated but this was not broken down to IgG or IgM.  Possible drug-induced hepatitis.  Patient has been off Celexa for one month.    Recommendations #1 repeat LFTs #2 check hepatitis A IgG and IgM

## 2014-02-17 LAB — IGM: IGM, SERUM: 208 mg/dL (ref 52–322)

## 2014-02-17 LAB — IGG: IGG (IMMUNOGLOBIN G), SERUM: 910 mg/dL (ref 690–1700)

## 2014-02-17 LAB — HEPATITIS A ANTIBODY, TOTAL: Hep A Total Ab: REACTIVE — AB

## 2014-03-16 ENCOUNTER — Ambulatory Visit (INDEPENDENT_AMBULATORY_CARE_PROVIDER_SITE_OTHER): Payer: BC Managed Care – PPO

## 2014-03-16 ENCOUNTER — Ambulatory Visit (INDEPENDENT_AMBULATORY_CARE_PROVIDER_SITE_OTHER): Payer: BC Managed Care – PPO | Admitting: Emergency Medicine

## 2014-03-16 VITALS — BP 122/78 | HR 75 | Temp 98.0°F | Resp 16 | Ht 69.0 in | Wt 303.0 lb

## 2014-03-16 DIAGNOSIS — S93402A Sprain of unspecified ligament of left ankle, initial encounter: Secondary | ICD-10-CM

## 2014-03-16 DIAGNOSIS — M25572 Pain in left ankle and joints of left foot: Secondary | ICD-10-CM

## 2014-03-16 MED ORDER — HYDROCODONE-ACETAMINOPHEN 5-325 MG PO TABS: 1.0000 | ORAL_TABLET | ORAL | Status: DC | PRN

## 2014-03-16 NOTE — Progress Notes (Signed)
[facility] [address] [place] [address] [phone]  Date:  03/16/2014   Name:  Melanie [name]   DOB:  06-11-1975   MRN:  [id number]  PCP:  Dow Adolph, MD    Chief Complaint: Ankle Injury   History of Present Illness:  Melanie [name] is a 39 y.o. very pleasant female patient who presents with the following:  Injured this morning stepping off a curb with an inversion injury. Has increased pain and swelling in left ankle Pain increases with ambulation and weight bearing.   Less with elevation No improvement with over the counter medications or other home remedies.  Denies other complaint or health concern today.   Patient Active Problem List   Diagnosis Date Noted  . Nonspecific abnormal results of liver function study 10/27/2013  . Depression with anxiety 06/06/2013  . Perennial allergic rhinitis 06/06/2013  . Infertility associated with anovulation 06/06/2013    Past Medical History  Diagnosis Date  . Allergy   . Anxiety   . Depression   . Membranous nephrosis     Past Surgical History  Procedure Laterality Date  . Knee surgery Left   . Wrist cyst Left   . Finger surgery Left      index finger  . Tonsillectomy and adenoidectomy  1978  . Tympanostomy tube placement      History  Substance Use Topics  . Smoking status: Never Smoker   . Smokeless tobacco: Never Used  . Alcohol Use: No    Family History  Problem Relation Age of Onset  . Diabetes Mother   . Liver disease Mother     ESLD- unknown etiology  . Diabetes Maternal Grandmother   . Diabetes Paternal Grandmother   . Breast cancer Paternal Grandmother   . Stroke Maternal Grandfather     Allergies  Allergen Reactions  . Lidocaine Other (See Comments)    Flu like sx's  . Relafen [Nabumetone] Other (See Comments)    migraine  . Celebrex [Celecoxib] Rash  . Sulfa Antibiotics Rash    Medication list has been reviewed and updated.  Current Outpatient  Prescriptions on File Prior to Visit  Medication Sig Dispense Refill  . B Complex Vitamins (VITAMIN-B COMPLEX PO) Take 1 tablet by mouth daily.    . fluticasone (FLONASE) 50 MCG/ACT nasal spray Place 2 sprays into both nostrils daily. 16 g 6  . l-methylfolate-B6-B12 (METANX) 3-35-2 MG TABS Take 1 tablet by mouth 2 (two) times daily. 60 tablet 5  . loratadine (CLARITIN) 10 MG tablet Take 10 mg by mouth daily.    . montelukast (SINGULAIR) 10 MG tablet Take 1 tablet (10 mg total) by mouth at bedtime. 30 tablet 5  . Omega-3 Fatty Acids (FISH OIL PO) Take 1 tablet by mouth daily.     No current facility-administered medications on file prior to visit.    Review of Systems:  As per HPI, otherwise negative.    Physical Examination: Filed Vitals:   03/16/14 1659  BP: 122/78  Pulse: 75  Temp: 98 F (36.7 C)  Resp: 16   Filed Vitals:   03/16/14 1659  Height: 5\' 9"  (1.753 m)  Weight: 303 lb (137.44 kg)   Body mass index is 44.72 kg/(m^2). Ideal Body Weight: Weight in (lb) to have BMI = 25: 168.9   GEN: WDWN, NAD, Non-toxic, Alert & Oriented x 3 HEENT: Atraumatic, Normocephalic.  Ears and Nose: No external deformity. EXTR: No clubbing/cyanosis/edema NEURO: Normal gait.  PSYCH: Normally interactive. Conversant. Not depressed or anxious appearing.  Calm demeanor.  Left ankle swollen and tender lateral malleolus  guards  Assessment and Plan: Sprain left ankle vicodin Air cast RICE  Signed,  Phillips Odor, MD   [facility] reading (PRIMARY) by  Dr. Dareen Piano.   Negative xray .

## 2014-03-16 NOTE — Patient Instructions (Signed)

## 2014-03-17 ENCOUNTER — Other Ambulatory Visit: Payer: Self-pay

## 2014-03-17 DIAGNOSIS — R7989 Other specified abnormal findings of blood chemistry: Secondary | ICD-10-CM

## 2014-03-17 DIAGNOSIS — R945 Abnormal results of liver function studies: Principal | ICD-10-CM

## 2014-03-18 ENCOUNTER — Telehealth: Payer: Self-pay | Admitting: *Deleted

## 2014-03-18 NOTE — Telephone Encounter (Signed)
Sent: 03/18/2014  8:16 AM EST      To: Nurse Jarome Matin Subject: RE: liver tests  Hi [name],  I won't be able to make the appointment on Thursday. Is there any way we could do the tests again in the beginning of February before we do a biopsy? I'd like to see if modifying my diet makes any difference.  Thanks! Melanie [name]  Dr. Deatra Ina, Please, advise.

## 2014-03-18 NOTE — Telephone Encounter (Signed)
Left a message for patient to call back. 

## 2014-03-18 NOTE — Telephone Encounter (Signed)
That would be fine 

## 2014-03-19 ENCOUNTER — Other Ambulatory Visit: Payer: Self-pay | Admitting: *Deleted

## 2014-03-19 ENCOUNTER — Ambulatory Visit: Payer: BC Managed Care – PPO | Admitting: Gastroenterology

## 2014-03-19 DIAGNOSIS — R7989 Other specified abnormal findings of blood chemistry: Secondary | ICD-10-CM

## 2014-03-19 DIAGNOSIS — R945 Abnormal results of liver function studies: Secondary | ICD-10-CM

## 2014-03-19 NOTE — Telephone Encounter (Signed)
Patient aware of recommendations. Lab in [facility] for February.

## 2014-03-19 NOTE — Telephone Encounter (Signed)
Left a message for patient to call back. 

## 2014-04-10 ENCOUNTER — Other Ambulatory Visit (INDEPENDENT_AMBULATORY_CARE_PROVIDER_SITE_OTHER): Payer: BC Managed Care – PPO

## 2014-04-10 DIAGNOSIS — R7989 Other specified abnormal findings of blood chemistry: Secondary | ICD-10-CM

## 2014-04-10 LAB — HEPATIC FUNCTION PANEL
ALK PHOS: 213 U/L — AB (ref 39–117)
ALT: 182 U/L — ABNORMAL HIGH (ref 0–35)
AST: 106 U/L — ABNORMAL HIGH (ref 0–37)
Albumin: 4.6 g/dL (ref 3.5–5.2)
BILIRUBIN TOTAL: 1.6 mg/dL — AB (ref 0.2–1.2)
Bilirubin, Direct: 0.4 mg/dL — ABNORMAL HIGH (ref 0.0–0.3)
Total Protein: 7.5 g/dL (ref 6.0–8.3)

## 2014-04-15 ENCOUNTER — Other Ambulatory Visit: Payer: Self-pay

## 2014-04-15 DIAGNOSIS — R748 Abnormal levels of other serum enzymes: Secondary | ICD-10-CM

## 2014-04-20 ENCOUNTER — Encounter: Payer: Self-pay | Admitting: Family Medicine

## 2014-04-24 ENCOUNTER — Other Ambulatory Visit: Payer: Self-pay | Admitting: Family Medicine

## 2014-04-24 MED ORDER — CLONAZEPAM 0.5 MG PO TABS: ORAL_TABLET | ORAL | Status: DC

## 2014-04-24 MED ORDER — BUSPIRONE HCL 10 MG PO TABS: ORAL_TABLET | ORAL | Status: DC

## 2014-04-29 ENCOUNTER — Telehealth: Payer: Self-pay | Admitting: Family Medicine

## 2014-04-29 ENCOUNTER — Ambulatory Visit (INDEPENDENT_AMBULATORY_CARE_PROVIDER_SITE_OTHER): Payer: BC Managed Care – PPO | Admitting: Urgent Care

## 2014-04-29 VITALS — BP 132/82 | HR 71 | Temp 98.0°F | Resp 17 | Ht 69.0 in | Wt 299.0 lb

## 2014-04-29 DIAGNOSIS — F418 Other specified anxiety disorders: Secondary | ICD-10-CM

## 2014-04-29 DIAGNOSIS — Z889 Allergy status to unspecified drugs, medicaments and biological substances status: Secondary | ICD-10-CM

## 2014-04-29 DIAGNOSIS — R202 Paresthesia of skin: Secondary | ICD-10-CM

## 2014-04-29 DIAGNOSIS — R200 Anesthesia of skin: Secondary | ICD-10-CM

## 2014-04-29 MED ORDER — EPINEPHRINE 0.3 MG/0.3ML IJ SOAJ: 0.3000 mg | Freq: Once | INTRAMUSCULAR | Status: DC

## 2014-04-29 MED ORDER — RANITIDINE HCL 150 MG PO TABS: 150.0000 mg | ORAL_TABLET | Freq: Two times a day (BID) | ORAL | Status: DC

## 2014-04-29 NOTE — Telephone Encounter (Signed)
I will send pt a MyChart message.

## 2014-04-29 NOTE — Telephone Encounter (Signed)
Patient called and is having an allergic reaction to Buspar. States that she is experiencing tingling in her lips and tongue. States that has subsided a little bit but she is still concerned. Advised patient if she gets worse to come back to our office or go to ER. Spoke [name] at 104. She took the call and spoke to patient.   [phone] [name])

## 2014-04-29 NOTE — Patient Instructions (Signed)
Drug Allergy Allergic reactions to medicines are common. Some allergic reactions are mild. A delayed type of drug allergy that occurs 1 week or more after exposure to a medicine or vaccine is called serum sickness. A life-threatening, sudden (acute) allergic reaction that involves the whole body is called anaphylaxis. CAUSES  "True" drug allergies occur when there is an allergic reaction to a medicine. This is caused by overactivity of the immune system. First, the body becomes sensitized. The immune system is triggered by your first exposure to the medicine. Following this first exposure, future exposure to the same medicine may be life-threatening. Almost any medicine can cause an allergic reaction. Common ones are:  Penicillin.  Sulfonamides (sulfa drugs).  Local anesthetics.  X-ray dyes that contain iodine. SYMPTOMS  Common symptoms of a minor allergic reaction are:  Swelling around the mouth.  An itchy red rash or hives.  Vomiting or diarrhea. Anaphylaxis can cause swelling of the mouth and throat. This makes it difficult to breathe and swallow. Severe reactions can be fatal within seconds, even after exposure to only a trace amount of the drug that causes the reaction. HOME CARE INSTRUCTIONS   If you are unsure of what caused your reaction, keep a diary of foods and medicines used. Include the symptoms that followed. Avoid anything that causes reactions.  You may want to follow up with an allergy specialist after the reaction has cleared in order to be tested to confirm the allergy. It is important to confirm that your reaction is an allergy, not just a side effect to the medicine. If you have a true allergy to a medicine, this may prevent that medicine and related medicines from being given to you when you are very ill.  If you have hives or a rash:  Take medicines as directed by your caregiver.  You may use an over-the-counter antihistamine (diphenhydramine) as  needed.  Apply cold compresses to the skin or take baths in cool water. Avoid hot baths or showers.  If you are severely allergic:  Continuous observation after a severe reaction may be needed. Hospitalization is often required.  Wear a medical alert bracelet or necklace stating your allergy.  You and your family must learn how to use an anaphylaxis kit or give an epinephrine injection to temporarily treat an emergency allergic reaction. If you have had a severe reaction, always carry your epinephrine injection or anaphylaxis kit with you. This can be lifesaving if you have a severe reaction.  Do not drive or perform tasks after treatment until the medicines used to treat your reaction have worn off, or until your caregiver says it is okay. SEEK MEDICAL CARE IF:   You think you had an allergic reaction. Symptoms usually start within 30 minutes after exposure.  Symptoms are getting worse rather than better.  You develop new symptoms.  The symptoms that brought you to your caregiver return. SEEK IMMEDIATE MEDICAL CARE IF:   You have swelling of the mouth, difficulty breathing, or wheezing.  You have a tight feeling in your chest or throat.  You develop hives, swelling, or itching all over your body.  You develop severe vomiting or diarrhea.  You feel faint or pass out. This is an emergency. Use your epinephrine injection or anaphylaxis kit as you have been instructed. Call for emergency medical help. Even if you improve after the injection, you need to be examined at a hospital emergency department. MAKE SURE YOU:   Understand these instructions.  Will watch  your condition.  Will get help right away if you are not doing well or get worse. Document Released: 02/27/2005 Document Revised: 05/22/2011 Document Reviewed: 08/03/2010 [facility] Patient Information 2015 [place], [place]. This information is not intended to replace advice given to you by your health care provider. Make  sure you discuss any questions you have with your health care provider.    Anaphylactic Reaction An anaphylactic reaction is a sudden, severe allergic reaction that involves the whole body. It can be life threatening. A hospital stay is often required. People with asthma, eczema, or hay fever are slightly more likely to have an anaphylactic reaction. CAUSES  An anaphylactic reaction may be caused by anything to which you are allergic. After being exposed to the allergic substance, your immune system becomes sensitized to it. When you are exposed to that allergic substance again, an allergic reaction can occur. Common causes of an anaphylactic reaction include:  Medicines.  Foods, especially peanuts, wheat, shellfish, milk, and eggs.  Insect bites or stings.  Blood products.  Chemicals, such as dyes, latex, and contrast material used for imaging tests. SYMPTOMS  When an allergic reaction occurs, the body releases histamine and other substances. These substances cause symptoms such as tightening of the airway. Symptoms often develop within seconds or minutes of exposure. Symptoms may include:  Skin rash or hives.  Itching.  Chest tightness.  Swelling of the eyes, tongue, or lips.  Trouble breathing or swallowing.  Lightheadedness or fainting.  Anxiety or confusion.  Stomach pains, vomiting, or diarrhea.  Nasal congestion.  A fast or irregular heartbeat (palpitations). DIAGNOSIS  Diagnosis is based on your history of recent exposure to allergic substances, your symptoms, and a physical exam. Your caregiver may also perform blood or urine tests to confirm the diagnosis. TREATMENT  Epinephrine medicine is the main treatment for an anaphylactic reaction. Other medicines that may be used for treatment include antihistamines, steroids, and albuterol. In severe cases, fluids and medicine to support blood pressure may be given through an intravenous line (IV). Even if you improve  after treatment, you need to be observed to make sure your condition does not get worse. This may require a stay in the hospital. [place] a medical alert bracelet or necklace stating your allergy.  You and your family must learn how to use an anaphylaxis kit or give an epinephrine injection to temporarily treat an emergency allergic reaction. Always carry your epinephrine injection or anaphylaxis kit with you. This can be lifesaving if you have a severe reaction.  Do not drive or perform tasks after treatment until the medicines used to treat your reaction have worn off, or until your caregiver says it is okay.  If you have hives or a rash:  Take medicines as directed by your caregiver.  You may use an over-the-counter antihistamine (diphenhydramine) as needed.  Apply cold compresses to the skin or take baths in cool water. Avoid hot baths or showers. SEEK MEDICAL CARE IF:   You develop symptoms of an allergic reaction to a new substance. Symptoms may start right away or minutes later.  You develop a rash, hives, or itching.  You develop new symptoms. SEEK IMMEDIATE MEDICAL CARE IF:   You have swelling of the mouth, difficulty breathing, or wheezing.  You have a tight feeling in your chest or throat.  You develop hives, swelling, or itching all over your body.  You develop severe vomiting or diarrhea.  You feel faint or pass  out. This is an emergency. Use your epinephrine injection or anaphylaxis kit as you have been instructed. Call your local emergency services (911 in [place] Even if you improve after the injection, you need to be examined at a hospital emergency department. MAKE SURE YOU:   Understand these instructions.  Will watch your condition.  Will get help right away if you are not doing well or get worse. Document Released: 02/27/2005 Document Revised: 03/04/2013 Document Reviewed: 05/31/2011 [facility] Patient Information 2015 [place],  [place]. This information is not intended to replace advice given to you by your health care provider. Make sure you discuss any questions you have with your health care provider.    Epinephrine Injection Epinephrine is a medicine given by injection to temporarily treat an emergency allergic reaction. It is also used to treat severe asthmatic attacks and other lung problems. The medicine helps to enlarge (dilate) the small breathing tubes of the lungs. A life-threatening, sudden allergic reaction that involves the whole body is called anaphylaxis. Because of potential side effects, epinephrine should only be used as directed by your caregiver. RISKS AND COMPLICATIONS Possible side effects of epinephrine injections include:  Chest pain.  Irregular or rapid heartbeat.  Shortness of breath.  Nausea.  Vomiting.  Abdominal pain or cramping.  Sweating.  Dizziness.  Weakness.  Headache.  Nervousness. Report all side effects to your caregiver. HOW TO GIVE AN EPINEPHRINE INJECTION Give the epinephrine injection immediately when symptoms of a severe reaction begin. Inject the medicine into the outer thigh or any available, large muscle. Your caregiver can teach you how to do this. You do not need to remove any clothing. After the injection, call your local emergency services (911 in [place] Even if you improve after the injection, you need to be examined at a hospital emergency department. Epinephrine works quickly, but it also wears off quickly. Delayed reactions can occur. A delayed reaction may be as serious and dangerous as the initial reaction. HOME CARE INSTRUCTIONS  Make sure you and your family know how to give an epinephrine injection.  Use epinephrine injections as directed by your caregiver. Do not use this medicine more often or in larger doses than prescribed.  Always carry your epinephrine injection or anaphylaxis kit with you. This can be lifesaving if you have a severe  reaction.  Store the medicine in a cool, dry place. If the medicine becomes discolored or cloudy, dispose of it properly and replace it with new medicine.  Check the expiration date on your medicine. It may be unsafe to use medicines past their expiration date.  Tell your caregiver about any other medicines you are taking. Some medicines can react badly with epinephrine.  Tell your caregiver about any medical conditions you have, such as diabetes, high blood pressure (hypertension), heart disease, irregular heartbeats, or if you are pregnant. SEEK IMMEDIATE MEDICAL CARE IF:  You have used an epinephrine injection. Call your local emergency services (911 in [place] Even if you improve after the injection, you need to be examined at a hospital emergency department to make sure your allergic reaction is under control. You will also be monitored for adverse effects from the medicine.  You have chest pain.  You have irregular or fast heartbeats.  You have shortness of breath.  You have severe headaches.  You have severe nausea, vomiting, or abdominal cramps.  You have severe pain, swelling, or redness in the area where you gave the injection. Document Released: 02/25/2000 Document Revised: 05/22/2011 Document Reviewed:  11/16/2010 [employer] Patient Information 2015 [place], [place]. This information is not intended to replace advice given to you by your health care provider. Make sure you discuss any questions you have with your health care provider.

## 2014-04-29 NOTE — Telephone Encounter (Signed)
Patient called states that she started BUSPAR last week and she think she took to much of the medicine now she is having tingling of her lips and tongue i advised patient to go to the ER or our walk in clinic she states that she will see what she can do cause she has a meeting and class this morning i stated that it was important that she gets seen

## 2014-04-29 NOTE — Progress Notes (Signed)
MRN: [id number] DOB: [birth year]  Subjective:   Melanie [name] is a 39 y.o. female presenting for chief complaint of Tongue numbness and lips numb  Reports tingling and numbness of her tongue and lips since this morning. She also felt like her hands were shaky, lightheaded, felt like tingling sensation was progressing toward back of her throat. She denies lip swelling, tongue swelling, facial swelling, hives, throat closing, shob, wheezing, chest tightness, heart racing, abdominal pain, nausea, vomiting. Patient cannot identify any inciting factors, states that symptoms started after she got out of the shower, had not ingested anything. However, patient started taking Buspar daily last week and Klonopin as needed, 3x since Friday. Otherwise no new exposures, no insect bites or stings. Of note, patient admits a history of shob associated with pepperoni, states that she thought it was an allergic reaction, took Benadryl and has not eaten pepperoni since, has not had this issue again. Has a history of allergic reactions to medications listed, reactions included rashes and migraine which she denies today. No smoking, no alcohol use. Denies any other aggravating or relieving factors, no other questions or concerns.  Melanie [name] has a current medication list which includes the following prescription(s): buspirone, clonazepam, fluticasone, l-methylfolate-b6-b12, loratadine, montelukast, and omega-3 fatty acids.  She is allergic to lidocaine; relafen; celebrex; and sulfa antibiotics.  Melanie [name]  has a past medical history of Allergy; Anxiety; Depression; and Membranous nephrosis. Also  has past surgical history that includes Knee surgery (Left); wrist cyst (Left); Finger surgery (Left); Tonsillectomy and adenoidectomy (1978); and Tympanostomy tube placement.  ROS As in subjective.  Objective:   Vitals: BP 132/82 mmHg  Pulse 71  Temp(Src) 98 F (36.7 C) (Oral)  Resp 17  Ht 5\' 9"  (1.753 m)  Wt 299 lb (135.626  kg)  BMI 44.13 kg/m2  SpO2 97%  Physical Exam  Constitutional: She is oriented to person, place, and time and well-developed, well-nourished, and in no distress.  HENT:  Oropharynx clear, pink and moist. Uvula at midline without any edema.  Eyes: Conjunctivae and EOM are normal. Pupils are equal, round, and reactive to light. Right eye exhibits no discharge. Left eye exhibits no discharge. No scleral icterus.  Neck: Normal range of motion. No tracheal deviation present. No thyromegaly present.  Cardiovascular: Normal rate, regular rhythm, normal heart sounds and intact distal pulses.  Exam reveals no gallop and no friction rub.   No murmur heard. Pulmonary/Chest: Effort normal and breath sounds normal. No stridor. No respiratory distress. She has no wheezes. She has no rales. She exhibits no tenderness.  Abdominal: Soft. Bowel sounds are normal. She exhibits no distension and no mass. There is tenderness (RUQ, not new in onset, unchanged per the patient). There is no guarding.  Musculoskeletal: She exhibits no edema.  Neurological: She is alert and oriented to person, place, and time.  Skin: Skin is warm and dry. No rash (no hives throughout) noted. No erythema.  Psychiatric: Mood and affect normal.   Assessment and Plan :   1. History of allergic reaction 2. Numbness or tingling - Unclear etiology, physical exam and vital signs reassuring. Patient is unable to take Benadryl since it makes her go to sleep and she cannot take time off work. - Advised that she start ranitidine and continue taking Claritin. - Counseled patient on worrisome signs of severe allergic reaction, provided rx for 2 pack epi-pen and advised her when to use it. - Continue current medications and if no improvement or worsening symptoms,  return to clinic for re-evaluation.   3. Depression with anxiety - Advised to continue Buspar and Klonopin for her anxiety - Symptoms are not typical for allergic reaction, there may  be an anxiety component to her symptoms - Advised to report her symptoms to PCP to consider whether or not to continue Buspar  Wallis Bamberg, PA-C [facility] [phone] 04/29/2014 9:15 AM

## 2014-05-04 ENCOUNTER — Other Ambulatory Visit: Payer: Self-pay | Admitting: Family Medicine

## 2014-05-15 ENCOUNTER — Encounter (HOSPITAL_COMMUNITY)
Admission: RE | Disposition: A | Discharge status: Home / Self Care | Payer: Self-pay | Source: Ambulatory Visit | Attending: Gastroenterology

## 2014-05-15 ENCOUNTER — Other Ambulatory Visit: Payer: Self-pay | Admitting: Gastroenterology

## 2014-05-15 ENCOUNTER — Encounter (HOSPITAL_COMMUNITY): Payer: Self-pay | Admitting: Gastroenterology

## 2014-05-15 ENCOUNTER — Ambulatory Visit (HOSPITAL_COMMUNITY)
Admission: RE | Admit: 2014-05-15 | Discharge: 2014-05-15 | Disposition: A | Discharge status: Home / Self Care | Payer: BC Managed Care – PPO | Source: Ambulatory Visit | Attending: Gastroenterology | Admitting: Gastroenterology

## 2014-05-15 DIAGNOSIS — F419 Anxiety disorder, unspecified: Secondary | ICD-10-CM | POA: Insufficient documentation

## 2014-05-15 DIAGNOSIS — Z882 Allergy status to sulfonamides status: Secondary | ICD-10-CM | POA: Insufficient documentation

## 2014-05-15 DIAGNOSIS — R7989 Other specified abnormal findings of blood chemistry: Secondary | ICD-10-CM

## 2014-05-15 DIAGNOSIS — K7581 Nonalcoholic steatohepatitis (NASH): Secondary | ICD-10-CM | POA: Insufficient documentation

## 2014-05-15 DIAGNOSIS — Z888 Allergy status to other drugs, medicaments and biological substances status: Secondary | ICD-10-CM | POA: Insufficient documentation

## 2014-05-15 DIAGNOSIS — N048 Nephrotic syndrome with other morphologic changes: Secondary | ICD-10-CM | POA: Insufficient documentation

## 2014-05-15 DIAGNOSIS — R945 Abnormal results of liver function studies: Principal | ICD-10-CM

## 2014-05-15 DIAGNOSIS — F329 Major depressive disorder, single episode, unspecified: Secondary | ICD-10-CM | POA: Insufficient documentation

## 2014-05-15 DIAGNOSIS — Z886 Allergy status to analgesic agent status: Secondary | ICD-10-CM | POA: Diagnosis not present

## 2014-05-15 HISTORY — PX: LIVER BIOPSY: SHX301

## 2014-05-15 SURGERY — BIOPSY, LIVER
Anesthesia: LOCAL

## 2014-05-15 SURGERY — BIOPSY, LIVER
Anesthesia: Moderate Sedation

## 2014-05-15 MED ORDER — LIDOCAINE HCL 1 % IJ SOLN
INTRAMUSCULAR | Status: AC
  Filled 2014-05-15: qty 20

## 2014-05-15 MED ORDER — FENTANYL CITRATE 0.05 MG/ML IJ SOLN
INTRAMUSCULAR | Status: AC
  Filled 2014-05-15: qty 2

## 2014-05-15 MED ORDER — FENTANYL CITRATE 0.05 MG/ML IJ SOLN
INTRAMUSCULAR | Status: DC | PRN
  Administered 2014-05-15: 25 ug via INTRAVENOUS

## 2014-05-15 MED ORDER — MIDAZOLAM HCL 10 MG/2ML IJ SOLN
INTRAMUSCULAR | Status: DC | PRN
  Administered 2014-05-15: 2 mg via INTRAVENOUS

## 2014-05-15 MED ORDER — MIDAZOLAM HCL 10 MG/2ML IJ SOLN
INTRAMUSCULAR | Status: AC
  Filled 2014-05-15: qty 2

## 2014-05-15 MED ORDER — DIPHENHYDRAMINE HCL 50 MG/ML IJ SOLN
INTRAMUSCULAR | Status: AC
  Filled 2014-05-15: qty 1

## 2014-05-15 NOTE — Discharge Instructions (Signed)
Liver Biopsy, Care After These instructions give you information on caring for yourself after your procedure. Your doctor may also give you more specific instructions. Call your doctor if you have any problems or questions after your procedure. HOME CARE  Rest at home for 1-2 days or as told by your doctor.  Have someone stay with you for at least 24 hours.  Do not do these things in the first 24 hours:  Drive.  Use machinery.  Take care of other people.  Sign legal documents.  Take a bath or shower.  There are many different ways to close and cover a cut (incision). For example, a cut can be closed with stitches, skin glue, or adhesive strips. Follow your doctor's instructions on:  Taking care of your cut.  Changing and removing your bandage (dressing).  Removing whatever was used to close your cut.  Do not drink alcohol in the first week.  Do not lift more than 5 pounds or play contact sports for the first 2 weeks.  Take medicines only as told by your doctor. For 1 week, do not take medicine that has aspirin in it or medicines like ibuprofen.  Get your test results. GET HELP IF:  A cut bleeds and leaves more than just a small spot of blood.  A cut is red, puffs up (swells), or hurts more than before.  Fluid or something else comes from a cut.  A cut smells bad.  You have a fever or chills. GET HELP RIGHT AWAY IF:  You have swelling, bloating, or pain in your belly (abdomen).  You get dizzy or faint.  You have a rash.  You feel sick to your stomach (nauseous) or throw up (vomit).  You have trouble breathing, feel short of breath, or feel faint.  Your chest hurts.  You have problems talking or seeing.  You have trouble balancing or moving your arms or legs. Document Released: 12/07/2007 Document Revised: 07/14/2013 Document Reviewed: 04/25/2013 [facility] Patient Information 2015 [place], [place]. This information is not intended to replace advice given to  you by your health care provider. Make sure you discuss any questions you have with your health care provider.  Conscious Sedation, Adult, Care After Refer to this sheet in the next few weeks. These instructions provide you with information on caring for yourself after your procedure. Your health care provider may also give you more specific instructions. Your treatment has been planned according to current medical practices, but problems sometimes occur. Call your health care provider if you have any problems or questions after your procedure. WHAT TO EXPECT AFTER THE PROCEDURE  After your procedure:  You may feel sleepy, clumsy, and have poor balance for several hours.  Vomiting may occur if you eat too soon after the procedure. HOME CARE INSTRUCTIONS  Do not participate in any activities where you could become injured for at least 24 hours. Do not:  Drive.  Swim.  Ride a bicycle.  Operate heavy machinery.  Cook.  Use power tools.  Climb ladders.  Work from a high place.  Do not make important decisions or sign legal documents until you are improved.  If you vomit, drink water, juice, or soup when you can drink without vomiting. Make sure you have little or no nausea before eating solid foods.  Only take over-the-counter or prescription medicines for pain, discomfort, or fever as directed by your health care provider.  Make sure you and your family fully understand everything about the medicines  given to you, including what side effects may occur.  You should not drink alcohol, take sleeping pills, or take medicines that cause drowsiness for at least 24 hours.  If you smoke, do not smoke without supervision.  If you are feeling better, you may resume normal activities 24 hours after you were sedated.  Keep all appointments with your health care provider. SEEK MEDICAL CARE IF:  Your skin is pale or bluish in color.  You continue to feel nauseous or vomit.  Your  pain is getting worse and is not helped by medicine.  You have bleeding or swelling.  You are still sleepy or feeling clumsy after 24 hours. SEEK IMMEDIATE MEDICAL CARE IF:  You develop a rash.  You have difficulty breathing.  You develop any type of allergic problem.  You have a fever. MAKE SURE YOU:  Understand these instructions.  Will watch your condition.  Will get help right away if you are not doing well or get worse. Document Released: 12/18/2012 Document Reviewed: 12/18/2012 [facility] Patient Information 2015 [place], [place]. This information is not intended to replace advice given to you by your health care provider. Make sure you discuss any questions you have with your health care provider.

## 2014-05-15 NOTE — H&P (Signed)
               _                                                                                                                History of Present Illness:  Melanie [name] is a 39 year old white female here for liver biopsy.  She has persistently elevated transaminases with negative serology workup.  Ultrasound demonstrates hepatic steatosis.   Past Medical History  Diagnosis Date  . Allergy   . Anxiety   . Depression   . Membranous nephrosis    Past Surgical History  Procedure Laterality Date  . Knee surgery Left   . Wrist cyst Left   . Finger surgery Left      index finger  . Tonsillectomy and adenoidectomy  1978  . Tympanostomy tube placement     family history includes Breast cancer in her paternal grandmother; Diabetes in her maternal grandmother, mother, and paternal grandmother; Liver disease in her mother; Stroke in her maternal grandfather. No current facility-administered medications for this encounter.   Allergies as of 04/15/2014 - Review Complete 03/16/2014  Allergen Reaction Noted  . Lidocaine Other (See Comments) 10/27/2013  . Relafen [nabumetone] Other (See Comments) 01/15/2013  . Celebrex [celecoxib] Rash 01/15/2013  . Sulfa antibiotics Rash 01/15/2013    reports that she has never smoked. She has never used smokeless tobacco. She reports that she does not drink alcohol or use illicit drugs.   Review of Systems: Pertinent positive and negative review of systems were noted in the above HPI section. All other review of systems were otherwise negative.  Vital signs were reviewed in today's medical record Physical Exam: General: Well developed , well nourished, no acute distress Skin: anicteric Head: Normocephalic and atraumatic Eyes:  sclerae anicteric, EOMI Ears: Normal auditory acuity Mouth: No deformity or lesions Neck: Supple, no masses or thyromegaly Lungs: Clear throughout to auscultation Heart: Regular rate and rhythm; no murmurs, rubs or  bruits Abdomen: Soft, non tender and non distended. No masses, hepatosplenomegaly or hernias noted. Normal Bowel sounds Rectal:deferred Musculoskeletal: Symmetrical with no gross deformities  Skin: No lesions on visible extremities Pulses:  Normal pulses noted Extremities: No clubbing, cyanosis, edema or deformities noted Neurological: Alert oriented x 4, grossly nonfocal Cervical Nodes:  No significant cervical adenopathy Inguinal Nodes: No significant inguinal adenopathy Psychological:  Alert and cooperative. Normal mood and affect  Impression-abnormal LFTs  Plan percutaneous liver biopsy

## 2014-05-15 NOTE — Op Note (Signed)
[facility] [place], [address]   LIVER BIOPSY UNDER ULTRASOUND PROCEDURE REPORT     EXAM DATE: 05/15/2014  PATIENT NAME:      [name], Melanie [name]           MR #:      [id number]  BIRTHDATE:       01/23/1976      VISIT #:     [id number] ATTENDING:     Inda Castle, MD     STATUS:     outpatient ASSISTANT:      Cherylynn Ridges, technician Cleda Daub, RN CGRN   PROCEDURE PERFORMED: INDICATIONS:     elevated liver function tests greater than 6 months.  MEDICATIONS:     Versed 2 mg IV and Fentanyl 25 mcg IV  DESCRIPTION OF PROCEDURE: After the risks benefits and alternatives of the procedure were thoroughly explained, informed consent was obtained.  The patient was placed in the supine position with the right side of the abdomen exposed. The span of the liver was verified under ultrasound. The area was sterilely prepped.  Under Betadine prep and local    anesthesia, a percutaneous liver biopsy was performed using the left lateral intercostal approach.  The Tru-cut biopsy needle was introduced and a a single-passage liver biopsy specimen was obtained in the usual fashion.      The specimen was submitted for histopathology.     .  The scope was then completely withdrawn from the patient and the procedure terminated.  ADVERSE EVENT:     There were no immediate complications. IMPRESSION:     S/p percutaneous liver biopsy  RECOMMENDATION:     1.  Await biopsy results 2.  Office visit 2-3 weeks   __________________________________ Inda Castle, MD eSigned:  Inda Castle, MD 05/15/2014 8:31 AM   cc:

## 2014-05-18 ENCOUNTER — Encounter (HOSPITAL_COMMUNITY): Payer: Self-pay | Admitting: Gastroenterology

## 2014-05-19 ENCOUNTER — Telehealth: Payer: Self-pay | Admitting: Gastroenterology

## 2014-05-20 ENCOUNTER — Other Ambulatory Visit: Payer: Self-pay

## 2014-05-20 DIAGNOSIS — K760 Fatty (change of) liver, not elsewhere classified: Secondary | ICD-10-CM

## 2014-05-20 NOTE — Telephone Encounter (Signed)
Emailed patient with possible dates for follow up.

## 2014-05-22 NOTE — Telephone Encounter (Signed)
Patient is scheduled for the elastography of the liver and for her follow up with Dr Deatra Ina after that.

## 2014-05-29 ENCOUNTER — Telehealth: Payer: Self-pay | Admitting: Gastroenterology

## 2014-05-29 NOTE — Telephone Encounter (Signed)
Please advise. I am not familiar with the post op part of liver biopsy.

## 2014-05-29 NOTE — Telephone Encounter (Signed)
Nothing specifically that she's doing.

## 2014-05-29 NOTE — Telephone Encounter (Signed)
Patient reassured.

## 2014-06-03 ENCOUNTER — Encounter: Payer: Self-pay | Admitting: Family Medicine

## 2014-06-04 ENCOUNTER — Ambulatory Visit (HOSPITAL_COMMUNITY)
Admission: RE | Admit: 2014-06-04 | Discharge: 2014-06-04 | Disposition: A | Discharge status: Home / Self Care | Payer: BC Managed Care – PPO | Source: Ambulatory Visit | Attending: Gastroenterology | Admitting: Gastroenterology

## 2014-06-04 DIAGNOSIS — K760 Fatty (change of) liver, not elsewhere classified: Secondary | ICD-10-CM | POA: Diagnosis present

## 2014-06-04 DIAGNOSIS — R7989 Other specified abnormal findings of blood chemistry: Secondary | ICD-10-CM | POA: Insufficient documentation

## 2014-06-08 ENCOUNTER — Ambulatory Visit (INDEPENDENT_AMBULATORY_CARE_PROVIDER_SITE_OTHER): Payer: BC Managed Care – PPO | Admitting: Gastroenterology

## 2014-06-08 ENCOUNTER — Encounter: Payer: Self-pay | Admitting: Gastroenterology

## 2014-06-08 VITALS — BP 120/76 | HR 76 | Ht 69.0 in | Wt 298.0 lb

## 2014-06-08 DIAGNOSIS — K7469 Other cirrhosis of liver: Secondary | ICD-10-CM | POA: Diagnosis not present

## 2014-06-08 DIAGNOSIS — K7400 Hepatic fibrosis, unspecified: Secondary | ICD-10-CM | POA: Insufficient documentation

## 2014-06-08 NOTE — Progress Notes (Signed)
      History of Present Illness:  Melanie [name] is here to review liver biopsy and elastopgraphy results.  Tests confirm the presence of Melanie [name] with early fibrosis.  She has suffered no ill effects since the biopsy.    Review of Systems: Pertinent positive and negative review of systems were noted in the above HPI section. All other review of systems were otherwise negative.    Current Medications, Allergies, Past Medical History, Past Surgical History, Family History and Social History were reviewed in [facility] record  Vital signs were reviewed in today's medical record. Physical Exam: General: Well developed , well nourished, no acute distress   See Assessment and Plan under Problem List

## 2014-06-08 NOTE — Assessment & Plan Note (Signed)
He should has early cirrhosis secondary to Hana.  Implications were discussed with the patient including high risk for advanced cirrhosis with ongoing inflammation.  Strategies for weight loss were discussed including diet and bariatric surgery.  We agreed that area gastric surgery is probably the most likely to succeed.  Accordingly.,  She'll be referred to the bariatric surgery program.

## 2014-06-08 NOTE — Patient Instructions (Signed)
We have given you the information to contact [facility] for the seminar that has to be completed before the consult appointment can be scheduled  After you complete the required seminar then the referral for the bariatric surgery can be made

## 2014-06-10 NOTE — Telephone Encounter (Signed)
-----   Message -----      From: [name]     Sent: 06/03/2014  7:51 AM      To: [facility] Clinical Message Pool    Subject: Non-Urgent Medical Question                 Good morning,     Can I request a refill for the singulair prescription? Thank you so much!!    Dan Maker

## 2014-06-18 ENCOUNTER — Ambulatory Visit (HOSPITAL_COMMUNITY): Payer: BC Managed Care – PPO

## 2014-06-22 ENCOUNTER — Other Ambulatory Visit: Payer: Self-pay | Admitting: Family Medicine

## 2014-06-22 MED ORDER — CLONAZEPAM 0.5 MG PO TABS: ORAL_TABLET | ORAL | Status: DC

## 2014-06-22 NOTE — Telephone Encounter (Signed)
Pharm faxed req for clonazepam RF, and also sent req for Buspar. Rfd the Buspar and sent clonazepam for review.

## 2014-06-22 NOTE — Telephone Encounter (Signed)
I phoned clonazepam refill tom pharmacy.

## 2014-06-23 ENCOUNTER — Ambulatory Visit (INDEPENDENT_AMBULATORY_CARE_PROVIDER_SITE_OTHER): Payer: BC Managed Care – PPO

## 2014-06-23 ENCOUNTER — Ambulatory Visit (INDEPENDENT_AMBULATORY_CARE_PROVIDER_SITE_OTHER): Payer: BC Managed Care – PPO | Admitting: Family Medicine

## 2014-06-23 VITALS — BP 124/86 | HR 76 | Temp 98.2°F | Resp 16 | Ht 68.75 in | Wt 294.0 lb

## 2014-06-23 DIAGNOSIS — R1012 Left upper quadrant pain: Secondary | ICD-10-CM

## 2014-06-23 DIAGNOSIS — R0781 Pleurodynia: Secondary | ICD-10-CM | POA: Diagnosis not present

## 2014-06-23 LAB — COMPREHENSIVE METABOLIC PANEL
ALBUMIN: 4.4 g/dL (ref 3.5–5.2)
ALT: 82 U/L — ABNORMAL HIGH (ref 0–35)
AST: 54 U/L — ABNORMAL HIGH (ref 0–37)
Alkaline Phosphatase: 157 U/L — ABNORMAL HIGH (ref 39–117)
BUN: 11 mg/dL (ref 6–23)
CALCIUM: 9.7 mg/dL (ref 8.4–10.5)
CHLORIDE: 102 meq/L (ref 96–112)
CO2: 27 meq/L (ref 19–32)
Creat: 0.68 mg/dL (ref 0.50–1.10)
Glucose, Bld: 106 mg/dL — ABNORMAL HIGH (ref 70–99)
Potassium: 4.5 mEq/L (ref 3.5–5.3)
SODIUM: 137 meq/L (ref 135–145)
Total Bilirubin: 1.4 mg/dL — ABNORMAL HIGH (ref 0.2–1.2)
Total Protein: 7.2 g/dL (ref 6.0–8.3)

## 2014-06-23 LAB — POCT CBC
Granulocyte percent: 58.4 %G (ref 37–80)
HCT, POC: 42.5 % (ref 37.7–47.9)
HEMOGLOBIN: 14.3 g/dL (ref 12.2–16.2)
LYMPH, POC: 3.2 (ref 0.6–3.4)
MCH, POC: 30.4 pg (ref 27–31.2)
MCHC: 33.6 g/dL (ref 31.8–35.4)
MCV: 90.4 fL (ref 80–97)
MID (cbc): 0.5 (ref 0–0.9)
MPV: 7.7 fL (ref 0–99.8)
POC Granulocyte: 5.2 (ref 2–6.9)
POC LYMPH PERCENT: 35.9 %L (ref 10–50)
POC MID %: 5.7 % (ref 0–12)
Platelet Count, POC: 235 10*3/uL (ref 142–424)
RBC: 4.69 M/uL (ref 4.04–5.48)
RDW, POC: 13 %
WBC: 8.9 10*3/uL (ref 4.6–10.2)

## 2014-06-23 LAB — LIPASE: LIPASE: 18 U/L (ref 0–75)

## 2014-06-23 LAB — POCT URINE PREGNANCY: Preg Test, Ur: NEGATIVE

## 2014-06-23 LAB — POCT UA - MICROSCOPIC ONLY
Bacteria, U Microscopic: NEGATIVE
CRYSTALS, UR, HPF, POC: NEGATIVE
Casts, Ur, LPF, POC: NEGATIVE
Mucus, UA: NEGATIVE
RBC, urine, microscopic: NEGATIVE
Yeast, UA: NEGATIVE

## 2014-06-23 LAB — POCT URINALYSIS DIPSTICK
Bilirubin, UA: NEGATIVE
Blood, UA: NEGATIVE
Glucose, UA: NEGATIVE
Ketones, UA: NEGATIVE
Leukocytes, UA: NEGATIVE
NITRITE UA: POSITIVE
PROTEIN UA: NEGATIVE
SPEC GRAV UA: 1.015
Urobilinogen, UA: 0.2
pH, UA: 5.5

## 2014-06-23 MED ORDER — METHOCARBAMOL 500 MG PO TABS: 500.0000 mg | ORAL_TABLET | Freq: Three times a day (TID) | ORAL | Status: DC | PRN

## 2014-06-23 NOTE — Progress Notes (Addendum)
[facility] [address] [place] [address] [phone]  Date:  06/23/2014   Name:  Melanie [name]   DOB:  31-Dec-1975   MRN:  [id number]  PCP:  Dow Adolph, MD    Chief Complaint: Flank Pain and Abdominal Pain   History of Present Illness:  Melanie [name] is a 39 y.o. very pleasant female patient who presents with the following:  Here today with abdominal pain that started yesterday afternoon in the left upper abdomen.  It started dull,but became sharper and more intense overnight.   The pain is fairly constant- it is worse with movement, breathing, coughing, yawning.   No history of kidney stone or pancreatitis She is not aware of any injury to the area No urinary sx such as dysuria, hematuria, frequency.    History of liver cirrhosis- this is due to fatty liver.  She is working on weight loss, Dr. Angelyn Punt is her hepatologist.    Wt Readings from Last 3 Encounters:  06/23/14 294 lb (133.358 kg)  06/08/14 298 lb (135.172 kg)  04/29/14 299 lb (135.626 kg)   She did have an abd [place] recently which was normal except for fatty liver  No history of gallstones.  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   LMP: she does not get menses. No suspicion of pregnancy  She has noted a little bit of nausea today, but no vomiting.  She ate just a pear this am. She ate normally yesterday No diarrhea or constipation She has not noted a fever  She has not been coughing a lot recently or doing any unusual exercies that she would expect to strain her trunk She has not noted any redness or rash on her trunk  Patient Active Problem List   Diagnosis Date Noted  . Hepatic cirrhosis 06/08/2014  . Nonspecific abnormal results of liver function study 10/27/2013  . Depression with anxiety 06/06/2013  . Perennial allergic rhinitis 06/06/2013  . 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 Allergy   . Anxiety   . Depression   . Membranous nephrosis     Past Surgical History  Procedure Laterality Date  . Knee surgery Left   . Wrist cyst Left   . Finger surgery Left      index finger  . Tonsillectomy and adenoidectomy  1978  . Tympanostomy tube placement    . Liver biopsy N/A 05/15/2014    Procedure: LIVER BIOPSY;  Surgeon: Louis Meckel, MD;  Location: [facility];  Service: Endoscopy;  Laterality: N/A;  ultrasound to mark the liver    History  Substance Use Topics  . Smoking status: Never Smoker   . Smokeless tobacco: Never Used  . Alcohol Use: No    Family History  Problem Relation Age of Onset  . Diabetes Mother   . Liver disease Mother     ESLD- unknown etiology  . Diabetes Maternal Grandmother   . Diabetes Paternal Grandmother   . Breast cancer Paternal Grandmother   . Stroke Maternal Grandfather     Allergies  Allergen Reactions  . Lidocaine Other (See Comments)    Flu like sx's  . Relafen [Nabumetone] Other (See Comments)    migraine  . Celebrex [Celecoxib] Rash  . Sulfa Antibiotics Rash    Medication list has been reviewed and updated.  Current Outpatient Prescriptions on File Prior  to Visit  Medication Sig Dispense Refill  . busPIRone (BUSPAR) 10 MG tablet TAKE 1 TABLET BY MOUTH TWICE A DAY OR AS DIRECTED. 60 tablet 3  . clonazePAM (KLONOPIN) 0.5 MG tablet Take 1 tab bid prn anxiety or 1.5 tablets hs for sleep. 45 tablet 2  . fluticasone (FLONASE) 50 MCG/ACT nasal spray Place 2 sprays into both nostrils daily. 16 g 6  . loratadine (CLARITIN) 10 MG tablet Take 10 mg by mouth daily.    . montelukast (SINGULAIR) 10 MG tablet TAKE 1 TABLET (10 MG TOTAL) BY MOUTH AT BEDTIME. 30 tablet 5  . Omega-3 Fatty Acids (FISH OIL PO) Take 1 tablet by mouth daily.    Marland Kitchen EPINEPHrine 0.3 mg/0.3 mL IJ SOAJ injection Inject 0.3 mLs (0.3 mg total) into the muscle once. (Patient not taking: Reported on 06/23/2014) 2 Device 1  .  ranitidine (ZANTAC) 150 MG tablet Take 1 tablet (150 mg total) by mouth 2 (two) times daily. (Patient not taking: Reported on 06/23/2014) 60 tablet 0   No current facility-administered medications on file prior to visit.    Review of Systems:  As per HPI- otherwise negative.   Physical Examination: Filed Vitals:   06/23/14 0821  BP: 124/86  Pulse: 76  Temp: 98.2 F (36.8 C)  Resp: 16   Filed Vitals:   06/23/14 0821  Height: 5' 8.75" (1.746 m)  Weight: 294 lb (133.358 kg)   Body mass index is 43.75 kg/(m^2). Ideal Body Weight: Weight in (lb) to have BMI = 25: 167.7  GEN: WDWN, NAD, Non-toxic, A & O x 3, obese, looks well HEENT: Atraumatic, Normocephalic. Neck supple. No masses, No LAD.  Bilateral TM wnl, oropharynx normal.  PEERL,EOMI.   Ears and Nose: No external deformity. CV: RRR, No M/G/R. No JVD. No thrill. No extra heart sounds. PULM: CTA B, no wheezes, crackles, rhonchi. No retractions. No resp. distress. No accessory muscle use. ABD: S, NT, ND, +BS. No rebound. No HSM. EXTR: No c/c/e NEURO Normal gait.  PSYCH: Normally interactive. Conversant. Not depressed or anxious appearing.  Calm demeanor.  She has easily reproducible tenderness of her trunk over the inferior anterior and lateral left ribs.  No rash or skin lesion.  The abdomen itself is non- tender.  She endorses the rib tenderness as the pain that she has noted   [facility] reading (PRIMARY) by  Dr. Patsy Lager. XBM:[id number] abd series:negative Left ribs: negative  ABDOMEN - 2 VIEW  COMPARISON: None.  FINDINGS: The bowel gas pattern is normal. There is no evidence of free air. No radio-opaque calculi or other significant radiographic abnormality is seen.  IMPRESSION: No acute abnormality is noted.  LEFT RIBS - 2 VIEW  COMPARISON: None.  FINDINGS: Cardiac shadow is within normal limits. The lungs are clear bilaterally. No infiltrate, effusion or pneumothorax is noted. The ribcage as visualized is  within normal limits. No gross soft tissue abnormality is seen.  IMPRESSION: No acute abnormality noted.  CHEST 1 VIEW  COMPARISON: None.  FINDINGS: Single lateral view of the chest was obtained as a supplement to the rib series obtained earlier. No focal infiltrate or sizable effusion is seen. Vessel on end is noted anteriorly. No acute bony abnormality is noted.  IMPRESSION: No active disease. Assessment and Plan: Abdominal pain, left upper quadrant - Plan: POCT CBC, POCT UA - Microscopic Only, POCT urinalysis dipstick, POCT urine pregnancy, Urine culture, Comprehensive metabolic panel, Lipase, DG Abd 2 Views  Rib pain on left side - Plan: DG  Ribs Unilateral Left, DG Chest 1 View, methocarbamol (ROBAXIN) 500 MG tablet  Suspect that her pain is likely MSK rib pain.  Discussed with her in detail.  Will also check a stat lipase to rule out pancreatitis. rx robaxin to use as needed for her pain.   Received her labs and called her. Let her know that her lipase looks good and that her LFTs are improved- she will continue her good work with weight loss.  I do feel that her pain is MSK, but offered to do a CT of her chest to absolutely rule- out PE or other etiology.  Doubt PE as her O2 sat and pulse are normal.  At this time she declines a CT but will keep me posted as to her progress.  She is advised to seek care if she is getting worse or if she becomes SOB- right now she has pain when she breathes but is OW not SOB   Results for orders placed or performed in visit on 06/23/14  Comprehensive metabolic panel  Result Value Ref Range   Sodium 137 135 - 145 mEq/L   Potassium 4.5 3.5 - 5.3 mEq/L   Chloride 102 96 - 112 mEq/L   CO2 27 19 - 32 mEq/L   Glucose, Bld 106 (H) 70 - 99 mg/dL   BUN 11 6 - 23 mg/dL   Creat 4.74 2.59 - 5.63 mg/dL   Total Bilirubin 1.4 (H) 0.2 - 1.2 mg/dL   Alkaline Phosphatase 157 (H) 39 - 117 U/L   AST 54 (H) 0 - 37 U/L   ALT 82 (H) 0 - 35 U/L   Total Protein  7.2 6.0 - 8.3 g/dL   Albumin 4.4 3.5 - 5.2 g/dL   Calcium 9.7 8.4 - 87.5 mg/dL  Lipase  Result Value Ref Range   Lipase 18 0 - 75 U/L  POCT CBC  Result Value Ref Range   WBC 8.9 4.6 - 10.2 K/uL   Lymph, poc 3.2 0.6 - 3.4   POC LYMPH PERCENT 35.9 10 - 50 %L   MID (cbc) 0.5 0 - 0.9   POC MID % 5.7 0 - 12 %M   POC Granulocyte 5.2 2 - 6.9   Granulocyte percent 58.4 37 - 80 %G   RBC 4.69 4.04 - 5.48 M/uL   Hemoglobin 14.3 12.2 - 16.2 g/dL   HCT, POC 64.3 32.9 - 47.9 %   MCV 90.4 80 - 97 fL   MCH, POC 30.4 27 - 31.2 pg   MCHC 33.6 31.8 - 35.4 g/dL   RDW, POC 51.8 %   Platelet Count, POC 235 142 - 424 K/uL   MPV 7.7 0 - 99.8 fL  POCT UA - Microscopic Only  Result Value Ref Range   WBC, Ur, HPF, POC 0-4    RBC, urine, microscopic neg    Bacteria, U Microscopic neg    Mucus, UA neg    Epithelial cells, urine per micros 0-2    Crystals, Ur, HPF, POC neg    Casts, Ur, LPF, POC neg    Yeast, UA neg   POCT urinalysis dipstick  Result Value Ref Range   Color, UA yellow    Clarity, UA clear    Glucose, UA neg    Bilirubin, UA neg    Ketones, UA neg    Spec Grav, UA 1.015    Blood, UA neg    pH, UA 5.5    Protein, UA neg  Urobilinogen, UA 0.2    Nitrite, UA positive    Leukocytes, UA Negative   POCT urine pregnancy  Result Value Ref Range   Preg Test, Ur Negative      Signed Abbe Amsterdam, MD  Called to check on her 4/13[facility].  Call me if not improved

## 2014-06-23 NOTE — Patient Instructions (Signed)
I will give give you a call with your lipase result.  If this is high, then you likely have pancreatitis.  Assuming this is negative I think that you probably have chest wall pain- or pain in the muscles of your chest Try the robaxin as needed for your pain- but remember it can make you feel drowsy.   I will be in touch with your soon!

## 2014-06-24 LAB — URINE CULTURE

## 2014-06-27 ENCOUNTER — Encounter: Payer: Self-pay | Admitting: Family Medicine

## 2014-07-03 ENCOUNTER — Ambulatory Visit (INDEPENDENT_AMBULATORY_CARE_PROVIDER_SITE_OTHER): Payer: BC Managed Care – PPO | Admitting: Family Medicine

## 2014-07-03 ENCOUNTER — Encounter: Payer: Self-pay | Admitting: Family Medicine

## 2014-07-03 VITALS — BP 129/79 | HR 72 | Temp 98.5°F | Resp 16 | Ht 68.25 in | Wt 295.0 lb

## 2014-07-03 DIAGNOSIS — R21 Rash and other nonspecific skin eruption: Secondary | ICD-10-CM | POA: Diagnosis not present

## 2014-07-03 DIAGNOSIS — J3089 Other allergic rhinitis: Secondary | ICD-10-CM

## 2014-07-03 DIAGNOSIS — J309 Allergic rhinitis, unspecified: Secondary | ICD-10-CM | POA: Diagnosis not present

## 2014-07-03 MED ORDER — AMOXICILLIN-POT CLAVULANATE 875-125 MG PO TABS: 1.0000 | ORAL_TABLET | Freq: Two times a day (BID) | ORAL | Status: DC

## 2014-07-03 MED ORDER — MUPIROCIN CALCIUM 2 % EX CREA: 1.0000 "application " | TOPICAL_CREAM | Freq: Two times a day (BID) | CUTANEOUS | Status: DC

## 2014-07-03 MED ORDER — MONTELUKAST SODIUM 10 MG PO TABS: ORAL_TABLET | ORAL | Status: DC

## 2014-07-03 NOTE — Patient Instructions (Signed)
I have prescribed oral antibiotic and topical antibiotic cream to be applied twice a day to rash.  I suggest you schedule a physical exam in about 6 months. If you would like to continue your care with Dr. Lorelei Pont, she has appointments on Mondays.

## 2014-07-03 NOTE — Progress Notes (Signed)
Subjective:    Patient ID: Melanie [name], female    DOB: Dec 07, 1975, 39 y.o.   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 Arlyce Dice). She is here to day for evaluation of an insect bite to L anterior shin present now for > 3 weeks. Area has grown and is pruritic. Application of OTC anit-inflammatory cream has been minimally effective. Pt has shaved her legs since that bite but tried to shave around the infected area.  Pt reported that she is pursuing weight loss strategies since her liver enzyme abnormalities are weight related and due to family hx of liver disease. Dr. Arlyce Dice referred pt for Bariatric Surgery consultation but pt is postponing that option. She will aggressively work on nutrition changes and increasing physical activity.   Patient Active Problem List   Diagnosis Date Noted  . Hepatic cirrhosis 06/08/2014  . Nonspecific abnormal results of liver function study 10/27/2013  . Depression with anxiety 06/06/2013  . Perennial allergic rhinitis 06/06/2013  . 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 Authorizing Provider  busPIRone (BUSPAR) 10 MG tablet TAKE 1 TABLET BY MOUTH TWICE A DAY OR AS DIRECTED.   Yes Maurice March, MD  clonazePAM (KLONOPIN) 0.5 MG tablet Take 1 tab bid prn anxiety or 1.5 tablets hs for sleep.   Yes Maurice March, MD  EPINEPHrine 0.3 mg/0.3 mL IJ SOAJ injection Inject 0.3 mLs (0.3 mg total) into the muscle once. 04/29/14  Yes Wallis Bamberg, PA-C  fluticasone (FLONASE) 50 MCG/ACT nasal spray Place 2 sprays into both nostrils daily. 11/29/13  Yes Maurice March, MD  loratadine (CLARITIN) 10 MG tablet Take 10 mg by mouth daily.   Yes Historical Provider, MD  methocarbamol (ROBAXIN) 500 MG tablet Take 1 tablet (500 mg total) by mouth every 8 (eight) hours as needed. 06/23/14  Yes Gwenlyn Found Copland, MD    Omega-3 Fatty Acids (FISH OIL PO) Take 1 tablet by mouth daily.   Yes Historical Provider, MD   SURG, SOC and [name] HX reviewed.  Review of Systems As per HPI. Negative for fever/chills, red streaks or swelling of LLE.     Objective:   Physical Exam  Constitutional: She is oriented to person, place, and time. She appears well-developed and well-nourished. No distress.  HENT:  Head: Normocephalic and atraumatic.  Eyes: Conjunctivae and EOM are normal. No scleral icterus.  Cardiovascular: Normal rate and regular rhythm.   Pulmonary/Chest: Effort normal. No respiratory distress.  Neurological: She is alert and oriented to person, place, and time. No cranial nerve deficit. Coordination normal.  Skin: Skin is warm and dry. Rash noted. She is not diaphoretic. No pallor.  L anterior mid-shin w/ cluster of red papules w/o swelling or pustular formation; no red streaks.  Psychiatric: She has a normal mood and affect. Her behavior is normal. Judgment and thought content normal.  Nursing note and vitals reviewed.     Assessment & Plan:  Rash and nonspecific skin eruption- Local intense reaction to insect bite >> oral antibiotic (Augmentin) and topical anti-infective.  Perennial allergic rhinitis- Pt inquires about treatment for allergies; her husband does injections and she thinks she may need similar treatment. I advised that she would need testing by a specialist who could determine what specific allergies she has and then formulate a treatment plan. She agrees to try Singulair  in addition to Flonase and OTC antihistamine. Will contact clinic if symptoms do not improve.  Meds ordered this encounter  Medications  . montelukast (SINGULAIR) 10 MG tablet    Sig: TAKE 1 TABLET (10 MG TOTAL) BY MOUTH AT BEDTIME.    Dispense:  30 tablet    Refill:  5    CYCLE FILL MEDICATION.  Marland Kitchen amoxicillin-clavulanate (AUGMENTIN) 875-125 MG per tablet    Sig: Take 1 tablet by mouth 2 (two) times daily.     Dispense:  20 tablet    Refill:  0  . mupirocin cream (BACTROBAN) 2 %    Sig: Apply 1 application topically 2 (two) times daily.    Dispense:  30 g    Refill:  0

## 2014-09-22 ENCOUNTER — Ambulatory Visit (INDEPENDENT_AMBULATORY_CARE_PROVIDER_SITE_OTHER): Payer: BC Managed Care – PPO | Admitting: Physician Assistant

## 2014-09-22 ENCOUNTER — Encounter: Payer: Self-pay | Admitting: Physician Assistant

## 2014-09-22 VITALS — BP 122/78 | HR 79 | Temp 97.8°F | Resp 16 | Ht 68.0 in | Wt 297.8 lb

## 2014-09-22 DIAGNOSIS — E669 Obesity, unspecified: Secondary | ICD-10-CM

## 2014-09-22 DIAGNOSIS — F329 Major depressive disorder, single episode, unspecified: Secondary | ICD-10-CM

## 2014-09-22 DIAGNOSIS — K7469 Other cirrhosis of liver: Secondary | ICD-10-CM | POA: Diagnosis not present

## 2014-09-22 DIAGNOSIS — J309 Allergic rhinitis, unspecified: Secondary | ICD-10-CM | POA: Diagnosis not present

## 2014-09-22 DIAGNOSIS — F419 Anxiety disorder, unspecified: Secondary | ICD-10-CM

## 2014-09-22 DIAGNOSIS — F32A Depression, unspecified: Secondary | ICD-10-CM

## 2014-09-22 LAB — HEPATIC FUNCTION PANEL
ALT: 139 U/L — ABNORMAL HIGH (ref 0–35)
AST: 83 U/L — ABNORMAL HIGH (ref 0–37)
Albumin: 4.6 g/dL (ref 3.5–5.2)
Alkaline Phosphatase: 180 U/L — ABNORMAL HIGH (ref 39–117)
BILIRUBIN INDIRECT: 1.2 mg/dL (ref 0.2–1.2)
Bilirubin, Direct: 0.3 mg/dL (ref 0.0–0.3)
Total Bilirubin: 1.5 mg/dL — ABNORMAL HIGH (ref 0.2–1.2)
Total Protein: 7.3 g/dL (ref 6.0–8.3)

## 2014-09-22 MED ORDER — LORAZEPAM 1 MG PO TABS: 1.0000 mg | ORAL_TABLET | Freq: Two times a day (BID) | ORAL | Status: DC | PRN

## 2014-09-22 MED ORDER — MONTELUKAST SODIUM 10 MG PO TABS: ORAL_TABLET | ORAL | Status: DC

## 2014-09-22 NOTE — Progress Notes (Signed)
[facility] [address] [place] [address] [phone]  Date:  09/22/2014   Name:  Melanie [name]   DOB:  28-May-1975   MRN:  [id number]  PCP:  Dow Adolph, MD    Chief Complaint: DISCUSS MEDICATION; Medication Refill; and Depression   History of Present Illness:  This is a 39 y.o. 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  Allergic rhinitis: needs refill of singular. Well controlled.  Fatty liver disease discovered 6 months ago on ultrasound. She had a liver bx performed march 2016 that showed early cirrhosis. About 1 year ago d/t elevated LFTs all medications processed through the liver were stopped including her meds for allergic rhinitis, klonopin and cymbalta. She was started back on meds for allergic rhinitis and anxiety but was not started back on cymbalta. Pt states she feels down and depressed. She wants to lose weight but she is unmotivated and can't stick to a plan. She eats to make herself feel better but she admits after eating she feels worse about herself which makes her eat more. Dr Arlyce Dice who performed her liver bx suggested bariatric surgery. Pt really doesn't want to do this - she wants to try losing the weight on her own but she feels her mood is keeping her back. She has been trying to exercise at the gym with her husband in the mornings but admits she is not very consistent with this. She feels the buspar is doing nothing for her anxiety. The klonopin helps when she is very anxious but makes her too sleepy. She takes about 1 tab a week. She states several years ago she was on lorazepam 1 mg and this worked well for her and did not make her as sleepy. She is wondering if she can try this.  Review of Systems:  Review of Systems See HPI  Patient Active Problem List   Diagnosis Date Noted  . Hepatic cirrhosis 06/08/2014  . Nonspecific abnormal  results of liver function study 10/27/2013  . Depression with anxiety 06/06/2013  . Perennial allergic rhinitis 06/06/2013  . 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 Authorizing Provider  busPIRone (BUSPAR) 10 MG tablet TAKE 1 TABLET BY MOUTH TWICE A DAY OR AS DIRECTED. 06/22/14  Yes Maurice March, MD  clonazePAM (KLONOPIN) 0.5 MG tablet Take 1 tab bid prn anxiety or 1.5 tablets hs for sleep. 06/22/14  Yes Maurice March, MD  EPINEPHrine 0.3 mg/0.3 mL IJ SOAJ injection Inject 0.3 mLs (0.3 mg total) into the muscle once. 04/29/14  Yes Wallis Bamberg, PA-C  fluticasone (FLONASE) 50 MCG/ACT nasal spray Place 2 sprays into both nostrils daily. 11/29/13  Yes Maurice March, MD  loratadine (CLARITIN) 10 MG tablet Take 10 mg by mouth daily.   Yes Historical Provider, MD  methocarbamol (ROBAXIN) 500 MG tablet Take 1 tablet (500 mg total) by mouth every 8 (eight) hours as needed. 06/23/14  Yes Jessica C Copland, MD  montelukast (SINGULAIR) 10 MG tablet TAKE 1 TABLET (10 MG TOTAL) BY MOUTH AT BEDTIME. 07/03/14  Yes Maurice March, MD  Multiple Vitamin (MULTIVITAMIN) tablet Take 1 tablet by mouth daily.   Yes Historical Provider, MD  mupirocin cream (BACTROBAN) 2 % Apply 1 application topically 2 (two) times daily. 07/03/14  Yes Maurice March, MD  Omega-3 Fatty Acids (FISH  OIL PO) Take 1 tablet by mouth daily.   Yes Historical Provider, MD    Allergies  Allergen Reactions  . Lidocaine Other (See Comments)    Flu like sx's  . Relafen [Nabumetone] Other (See Comments)    migraine  . Celebrex [Celecoxib] Rash  . Sulfa Antibiotics Rash    Past Surgical History  Procedure Laterality Date  . Knee surgery Left   . Wrist cyst Left   . Finger surgery Left      index finger  . Tonsillectomy and adenoidectomy  1978  . Tympanostomy tube placement    . Liver biopsy N/A 05/15/2014    Procedure: LIVER  BIOPSY;  Surgeon: Louis Meckel, MD;  Location: [facility];  Service: Endoscopy;  Laterality: N/A;  ultrasound to mark the liver    History  Substance Use Topics  . Smoking status: Never Smoker   . Smokeless tobacco: Never Used  . Alcohol Use: No    Family History  Problem Relation Age of Onset  . Diabetes Mother   . Liver disease Mother     ESLD- unknown etiology  . Diabetes Maternal Grandmother   . Diabetes Paternal Grandmother   . Breast cancer Paternal Grandmother   . Stroke Maternal Grandfather     Medication list has been reviewed and updated.  Physical Examination:  Physical Exam  Constitutional: She is oriented to person, place, and time. She appears well-developed and well-nourished. No distress.  obese  HENT:  Head: Normocephalic and atraumatic.  Right Ear: Hearing normal.  Left Ear: Hearing normal.  Nose: Nose normal.  Eyes: Conjunctivae and lids are normal. Right eye exhibits no discharge. Left eye exhibits no discharge. No scleral icterus.  Pulmonary/Chest: Effort normal. No respiratory distress.  Musculoskeletal: Normal range of motion.  Neurological: She is alert and oriented to person, place, and time.  Skin: Skin is warm, dry and intact. No lesion and no rash noted.  Psychiatric: She has a normal mood and affect. Her speech is normal and behavior is normal. Thought content normal.  Appears cheerful   BP 122/78 mmHg  Pulse 79  Temp(Src) 97.8 F (36.6 C) (Oral)  Resp 16  Ht 5\' 8"  (1.727 m)  Wt 297 lb 12.8 oz (135.081 kg)  BMI 45.29 kg/m2  LMP  Lab Results  Component Value Date   ALT 139* 09/22/2014   AST 83* 09/22/2014   ALKPHOS 180* 09/22/2014   BILITOT 1.5* 09/22/2014    Assessment and Plan:  1. Anxiety 2. Depression 3. Other cirrhosis of liver 4. obesity Stay with buspar for now. Switch to ativan. Will send message to Dr. Arlyce Dice to see what he thinks about starting a medication for depression. Really stressed with patient the need  to participate in therapy. Discussed the need to commit to weight loss with diet and exercise. Gave contact information for therapists. LFTs holding steady at above numbers. Follow up 3 months. - LORazepam (ATIVAN) 1 MG tablet; Take 1 tablet (1 mg total) by mouth 2 (two) times daily as needed for anxiety.  Dispense: 30 tablet; Refill: 2 - Hepatic Function Panel   5. Allergic rhinitis, unspecified allergic rhinitis type Stable. - montelukast (SINGULAIR) 10 MG tablet; TAKE 1 TABLET (10 MG TOTAL) BY MOUTH AT BEDTIME.  Dispense: 30 tablet; Refill: 5   Justyce Yeater V. Dyke Brackett, MHS [facility]  09/23/2014

## 2014-09-22 NOTE — Patient Instructions (Addendum)
Goal weight loss 10-15 by next visit. Take lorazepam as needed for anxiety. Exercise daily. Work on [employer].  I will send a message to your liver specialist.  For therapy -- [facility] ([address] [place]) - [phone] [place] [facility] [place]) - [facility] - [phone] (do not file insurance)

## 2014-09-25 ENCOUNTER — Telehealth: Payer: Self-pay | Admitting: Physician Assistant

## 2014-09-25 DIAGNOSIS — F32A Depression, unspecified: Secondary | ICD-10-CM

## 2014-09-25 DIAGNOSIS — F329 Major depressive disorder, single episode, unspecified: Secondary | ICD-10-CM

## 2014-09-25 MED ORDER — CITALOPRAM HYDROBROMIDE 20 MG PO TABS: 20.0000 mg | ORAL_TABLET | Freq: Every day | ORAL | Status: DC

## 2014-09-25 NOTE — Telephone Encounter (Signed)
Spoke with Dr. Deatra Ina who saw no problem with putting pt back on antidepressant. Called pt, she wants to go back on Celexa. Sent to pharmacy. She will take 10 mg QD x 1 week, then 20 mg QD thereafter. Return for follow up in 3 months. Pt reports she has never had weight gain with antidepressants in the past.

## 2014-10-21 ENCOUNTER — Other Ambulatory Visit: Payer: Self-pay | Admitting: Family Medicine

## 2014-12-21 ENCOUNTER — Other Ambulatory Visit: Payer: Self-pay | Admitting: Physician Assistant

## 2014-12-29 ENCOUNTER — Ambulatory Visit: Payer: BC Managed Care – PPO | Admitting: Physician Assistant

## 2015-01-19 ENCOUNTER — Other Ambulatory Visit: Payer: Self-pay | Admitting: Physician Assistant

## 2015-02-18 ENCOUNTER — Other Ambulatory Visit: Payer: Self-pay | Admitting: Physician Assistant

## 2015-03-15 ENCOUNTER — Ambulatory Visit (INDEPENDENT_AMBULATORY_CARE_PROVIDER_SITE_OTHER): Payer: BC Managed Care – PPO | Admitting: Physician Assistant

## 2015-03-15 VITALS — BP 124/82 | HR 73 | Temp 98.1°F | Resp 16 | Ht 69.0 in | Wt 297.0 lb

## 2015-03-15 DIAGNOSIS — J309 Allergic rhinitis, unspecified: Secondary | ICD-10-CM

## 2015-03-15 DIAGNOSIS — R21 Rash and other nonspecific skin eruption: Secondary | ICD-10-CM

## 2015-03-15 DIAGNOSIS — E669 Obesity, unspecified: Secondary | ICD-10-CM | POA: Insufficient documentation

## 2015-03-15 DIAGNOSIS — J3089 Other allergic rhinitis: Secondary | ICD-10-CM

## 2015-03-15 DIAGNOSIS — Z6841 Body Mass Index (BMI) 40.0 and over, adult: Secondary | ICD-10-CM | POA: Insufficient documentation

## 2015-03-15 MED ORDER — CLOBETASOL PROPIONATE 0.05 % EX OINT: 1.0000 "application " | TOPICAL_OINTMENT | Freq: Two times a day (BID) | CUTANEOUS | Status: DC

## 2015-03-15 MED ORDER — FLUTICASONE PROPIONATE 50 MCG/ACT NA SUSP: 2.0000 | Freq: Every day | NASAL | Status: DC

## 2015-03-15 NOTE — Patient Instructions (Addendum)
We recommend that you schedule a mammogram for breast cancer screening. Typically, you do not need a referral to do this. Please contact a local imaging center to schedule your mammogram.  [facility] - [phone]  *ask for the Radiology Department [facility] - [phone] or [phone]  [facility] - [phone] [place] [phone] [facility] - [phone]  *ask for the [facility] - [phone]  *ask for the Radiology Department [facility] - [phone]  *ask for the [place] - [phone]   Drink at least 64 ounces of water daily. Consider a humidifier for the room where you sleep. Bathe once daily. Avoid using HOT water, as it dries skin. Avoid deodorant soaps (Dial is the worst!) and stick with gentle cleansers (I like Cetaphil Liquid Cleanser). After bathing, dry off completely, then apply a thick emollient cream (I like Cetaphil Moisturizing Cream). Apply the cream twice daily, or more!

## 2015-03-15 NOTE — Progress Notes (Signed)
Patient ID: Melanie [name], female    DOB: 1975/03/30, 40 y.o.   MRN: [id number]  PCP: Dow Adolph, MD  Subjective:   Chief Complaint  Patient presents with  . Rash    left leg, x 3 months, not going away    HPI Presents for evaluation of rash on the LEFT anterior tibia.  Also needs a refill of Flonase.  Initially began in March, when she got a mosquito bite at the site. She then developed redness that expanded several inches around the site. Extremely itchy. Was seen here in April and prescribed Mupirocin 2% cream and Augmentin without benefit. The symptoms eventually resolved on their own.  3 months ago, the symptoms returned. While visiting in [place], her mother-in-law offered her triamcinolone 0.1% cream, also without benefit. She has continued to have symptoms, and finally decided to come in.  Review of Systems As above.    Patient Active Problem List   Diagnosis Date Noted  . Hepatic cirrhosis (HCC) 06/08/2014  . Nonspecific abnormal results of liver function study 10/27/2013  . Depression with anxiety 06/06/2013  . Perennial allergic rhinitis 06/06/2013  . Infertility associated with anovulation 06/06/2013     Prior to Admission medications   Medication Sig Start Date End Date Taking? Authorizing Provider  busPIRone (BUSPAR) 10 MG tablet TAKE 1 TABLET BY MOUTH TWICE A DAY OR AS DIRECTED  "OFFICE VISIT NEEDED FOR REFILLS" 01/20/15  Yes Lanier Clam V, PA-C  citalopram (CELEXA) 20 MG tablet TAKE 1 TABLET (20 MG TOTAL) BY MOUTH DAILY 02/19/15  Yes Lanier Clam V, PA-C  EPINEPHrine 0.3 mg/0.3 mL IJ SOAJ injection Inject 0.3 mLs (0.3 mg total) into the muscle once. 04/29/14  Yes Wallis Bamberg, PA-C  fluticasone (FLONASE) 50 MCG/ACT nasal spray Place 2 sprays into both nostrils daily. 11/29/13  Yes Maurice March, MD  loratadine (CLARITIN) 10 MG tablet Take 10 mg by mouth daily.   Yes Historical Provider, MD  LORazepam (ATIVAN) 1 MG tablet Take 1 tablet (1 mg total)  by mouth 2 (two) times daily as needed for anxiety. 09/22/14  Yes Lanier Clam V, PA-C  methocarbamol (ROBAXIN) 500 MG tablet Take 1 tablet (500 mg total) by mouth every 8 (eight) hours as needed. 06/23/14  Yes Jessica C Copland, MD  montelukast (SINGULAIR) 10 MG tablet TAKE 1 TABLET (10 MG TOTAL) BY MOUTH AT BEDTIME. 09/22/14  Yes Lanier Clam V, PA-C  Multiple Vitamin (MULTIVITAMIN) tablet Take 1 tablet by mouth daily.   Yes Historical Provider, MD  mupirocin cream (BACTROBAN) 2 % Apply 1 application topically 2 (two) times daily. 07/03/14  Yes Maurice March, MD  Omega-3 Fatty Acids (FISH OIL PO) Take 1 tablet by mouth daily.   Yes Historical Provider, MD     Allergies  Allergen Reactions  . Lidocaine Other (See Comments)    Flu like sx's  . Relafen [Nabumetone] Other (See Comments)    migraine  . Celebrex [Celecoxib] Rash  . Sulfa Antibiotics Rash       Objective:  Physical Exam  Constitutional: She is oriented to person, place, and time. She appears well-developed and well-nourished. She is active and cooperative. No distress.  BP 124/82 mmHg  Pulse 73  Temp(Src) 98.1 F (36.7 C)  Resp 16  Ht 5\' 9"  (1.753 m)  Wt 297 lb (134.718 kg)  BMI 43.84 kg/m2  SpO2 95%  LMP    Eyes: Conjunctivae are normal.  Pulmonary/Chest: Effort normal.  Neurological: She is alert and oriented  to person, place, and time.  Skin: Skin is warm and dry. Rash noted. Rash is maculopapular.     Psychiatric: She has a normal mood and affect. Her speech is normal and behavior is normal.           Assessment & Plan:   1. Perennial allergic rhinitis Continue Claritin, Singulair. - fluticasone (FLONASE) 50 MCG/ACT nasal spray; Place 2 sprays into both nostrils daily.  Dispense: 48 g; Refill: 3  2. Rash and nonspecific skin eruption Dry skin care. Anticipatory guidance. - clobetasol ointment (TEMOVATE) 0.05 %; Apply 1 application topically 2 (two) times daily.  Dispense: 60 g; Refill:  0   Return for wellness visit, as able.   Fernande Bras, PA-C Physician Assistant-Certified [facility]

## 2015-03-15 NOTE — Progress Notes (Signed)
  Medical screening examination/treatment/procedure(s) were performed by non-physician practitioner and as supervising physician I was immediately available for consultation/collaboration.     

## 2015-03-23 ENCOUNTER — Other Ambulatory Visit: Payer: Self-pay | Admitting: Physician Assistant

## 2015-04-07 ENCOUNTER — Ambulatory Visit (INDEPENDENT_AMBULATORY_CARE_PROVIDER_SITE_OTHER): Payer: BC Managed Care – PPO | Admitting: Family Medicine

## 2015-04-07 VITALS — BP 124/80 | HR 84 | Temp 98.5°F | Resp 20 | Ht 69.0 in | Wt 300.8 lb

## 2015-04-07 DIAGNOSIS — R945 Abnormal results of liver function studies: Secondary | ICD-10-CM

## 2015-04-07 DIAGNOSIS — F4323 Adjustment disorder with mixed anxiety and depressed mood: Secondary | ICD-10-CM | POA: Diagnosis not present

## 2015-04-07 DIAGNOSIS — Z23 Encounter for immunization: Secondary | ICD-10-CM | POA: Diagnosis not present

## 2015-04-07 DIAGNOSIS — R7989 Other specified abnormal findings of blood chemistry: Secondary | ICD-10-CM

## 2015-04-07 MED ORDER — BUSPIRONE HCL 10 MG PO TABS: ORAL_TABLET | ORAL | Status: DC

## 2015-04-07 MED ORDER — CITALOPRAM HYDROBROMIDE 20 MG PO TABS: ORAL_TABLET | ORAL | Status: DC

## 2015-04-07 NOTE — Progress Notes (Signed)
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  She needs at TDAP and med renewal  Rash is resolved  F/Hx positive for liver failure (Mom)  Her anxiety and depression are well controlled on the regimen prescribed  Objective:  BP 124/80 mmHg  Pulse 84  Temp(Src) 98.5 F (36.9 C) (Oral)  Resp 20  Ht 5' 9"  (1.753 m)  Wt 300 lb 12.8 oz (136.442 kg)  BMI 44.40 kg/m2  SpO2 95%  Heart:  Reg, no murmur Chest: clear We discussed need for losing weight but with the new additions to her family.  Assessment:  Stressed but handling things   Need for prophylactic vaccination with combined diphtheria-tetanus-pertussis (DTP) vaccine - Plan: Tdap vaccine greater than or equal to 7yo IM  Adjustment disorder with mixed anxiety and depressed mood - Plan: busPIRone (BUSPAR) 10 MG tablet, citalopram (CELEXA) 20 MG tablet  Abnormal LFTs - Plan: Hepatic function panel    Robyn Haber, MD

## 2015-04-08 LAB — HEPATIC FUNCTION PANEL
ALT: 134 U/L — ABNORMAL HIGH (ref 6–29)
AST: 70 U/L — ABNORMAL HIGH (ref 10–30)
Albumin: 4.6 g/dL (ref 3.6–5.1)
Alkaline Phosphatase: 160 U/L — ABNORMAL HIGH (ref 33–115)
Bilirubin, Direct: 0.2 mg/dL (ref <=0.2)
Indirect Bilirubin: 1 mg/dL (ref 0.2–1.2)
Total Bilirubin: 1.2 mg/dL (ref 0.2–1.2)
Total Protein: 7.2 g/dL (ref 6.1–8.1)

## 2015-04-18 ENCOUNTER — Telehealth: Payer: Self-pay | Admitting: Family

## 2015-04-18 DIAGNOSIS — N390 Urinary tract infection, site not specified: Secondary | ICD-10-CM

## 2015-04-18 MED ORDER — NITROFURANTOIN MONOHYD MACRO 100 MG PO CAPS: 100.0000 mg | ORAL_CAPSULE | Freq: Two times a day (BID) | ORAL | Status: DC

## 2015-04-18 NOTE — Progress Notes (Signed)

## 2015-05-17 ENCOUNTER — Telehealth: Payer: BC Managed Care – PPO | Admitting: Family

## 2015-05-17 DIAGNOSIS — R6889 Other general symptoms and signs: Secondary | ICD-10-CM

## 2015-05-17 MED ORDER — OSELTAMIVIR PHOSPHATE 75 MG PO CAPS: 75.0000 mg | ORAL_CAPSULE | Freq: Two times a day (BID) | ORAL | Status: DC

## 2015-05-17 NOTE — Progress Notes (Signed)

## 2015-05-19 ENCOUNTER — Institutional Professional Consult (permissible substitution): Payer: BC Managed Care – PPO | Admitting: Neurology

## 2015-05-19 ENCOUNTER — Telehealth: Payer: Self-pay

## 2015-05-19 ENCOUNTER — Ambulatory Visit (INDEPENDENT_AMBULATORY_CARE_PROVIDER_SITE_OTHER): Payer: BC Managed Care – PPO | Admitting: Physician Assistant

## 2015-05-19 ENCOUNTER — Telehealth: Payer: Self-pay | Admitting: Internal Medicine

## 2015-05-19 VITALS — BP 110/76 | HR 118 | Temp 100.0°F | Resp 16 | Ht 69.0 in | Wt 298.0 lb

## 2015-05-19 DIAGNOSIS — K5901 Slow transit constipation: Secondary | ICD-10-CM

## 2015-05-19 DIAGNOSIS — R8299 Other abnormal findings in urine: Secondary | ICD-10-CM | POA: Diagnosis not present

## 2015-05-19 DIAGNOSIS — R6889 Other general symptoms and signs: Secondary | ICD-10-CM

## 2015-05-19 DIAGNOSIS — R739 Hyperglycemia, unspecified: Secondary | ICD-10-CM

## 2015-05-19 DIAGNOSIS — R82998 Other abnormal findings in urine: Secondary | ICD-10-CM

## 2015-05-19 DIAGNOSIS — E860 Dehydration: Secondary | ICD-10-CM

## 2015-05-19 DIAGNOSIS — N10 Acute tubulo-interstitial nephritis: Secondary | ICD-10-CM

## 2015-05-19 DIAGNOSIS — R81 Glycosuria: Secondary | ICD-10-CM | POA: Diagnosis not present

## 2015-05-19 DIAGNOSIS — R112 Nausea with vomiting, unspecified: Secondary | ICD-10-CM | POA: Diagnosis not present

## 2015-05-19 LAB — HEMOGLOBIN A1C
Hgb A1c MFr Bld: 5.8 % — ABNORMAL HIGH (ref <5.7)
Mean Plasma Glucose: 120 mg/dL — ABNORMAL HIGH (ref <117)

## 2015-05-19 LAB — POCT CBC
Granulocyte percent: 83.2 %G — AB (ref 37–80)
HEMATOCRIT: 36 % — AB (ref 37.7–47.9)
Hemoglobin: 13 g/dL (ref 12.2–16.2)
Lymph, poc: 1.2 (ref 0.6–3.4)
MCH: 32.6 pg — AB (ref 27–31.2)
MCHC: 36.2 g/dL — AB (ref 31.8–35.4)
MCV: 89.9 fL (ref 80–97)
MID (CBC): 0.5 (ref 0–0.9)
MPV: 7.8 fL (ref 0–99.8)
POC GRANULOCYTE: 8.7 — AB (ref 2–6.9)
POC LYMPH PERCENT: 11.8 %L (ref 10–50)
POC MID %: 5 % (ref 0–12)
Platelet Count, POC: 160 10*3/uL (ref 142–424)
RBC: 4 M/uL — AB (ref 4.04–5.48)
RDW, POC: 14.3 %
WBC: 10.4 10*3/uL — AB (ref 4.6–10.2)

## 2015-05-19 LAB — POCT URINALYSIS DIP (MANUAL ENTRY)
Glucose, UA: 100 — AB
NITRITE UA: POSITIVE — AB
PH UA: 5.5
Spec Grav, UA: 1.015
UROBILINOGEN UA: 2

## 2015-05-19 LAB — COMPLETE METABOLIC PANEL WITH GFR
ALT: 186 U/L — AB (ref 6–29)
AST: 92 U/L — AB (ref 10–30)
Albumin: 3.9 g/dL (ref 3.6–5.1)
Alkaline Phosphatase: 184 U/L — ABNORMAL HIGH (ref 33–115)
BUN: 11 mg/dL (ref 7–25)
CALCIUM: 9.5 mg/dL (ref 8.6–10.2)
CHLORIDE: 100 mmol/L (ref 98–110)
CO2: 26 mmol/L (ref 20–31)
CREATININE: 1.03 mg/dL (ref 0.50–1.10)
GFR, EST AFRICAN AMERICAN: 78 mL/min (ref >=60)
GFR, EST NON AFRICAN AMERICAN: 68 mL/min (ref >=60)
Glucose, Bld: 137 mg/dL — ABNORMAL HIGH (ref 65–99)
POTASSIUM: 4.1 mmol/L (ref 3.5–5.3)
SODIUM: 138 mmol/L (ref 135–146)
Total Bilirubin: 3.4 mg/dL — ABNORMAL HIGH (ref 0.2–1.2)
Total Protein: 7 g/dL (ref 6.1–8.1)

## 2015-05-19 LAB — POC MICROSCOPIC URINALYSIS (UMFC): MUCUS RE: ABSENT

## 2015-05-19 LAB — GLUCOSE, POCT (MANUAL RESULT ENTRY): POC GLUCOSE: 137 mg/dL — AB (ref 70–99)

## 2015-05-19 MED ORDER — CEFTRIAXONE SODIUM 1 G IJ SOLR
1.0000 g | Freq: Once | INTRAMUSCULAR | Status: AC
  Administered 2015-05-19: 1 g via INTRAMUSCULAR

## 2015-05-19 MED ORDER — IBUPROFEN 200 MG PO TABS
600.0000 mg | ORAL_TABLET | Freq: Once | ORAL | Status: AC
  Administered 2015-05-19: 600 mg via ORAL

## 2015-05-19 MED ORDER — ONDANSETRON 4 MG PO TBDP
4.0000 mg | ORAL_TABLET | Freq: Once | ORAL | Status: AC
  Administered 2015-05-19: 4 mg via ORAL

## 2015-05-19 MED ORDER — POLYETHYLENE GLYCOL 3350 17 GM/SCOOP PO POWD
17.0000 g | Freq: Every day | ORAL | Status: DC
Start: 2015-05-19 — End: 2017-03-30

## 2015-05-19 MED ORDER — CIPROFLOXACIN HCL 500 MG PO TABS: 500.0000 mg | ORAL_TABLET | Freq: Two times a day (BID) | ORAL | Status: DC

## 2015-05-19 MED ORDER — ONDANSETRON 4 MG PO TBDP: 4.0000 mg | ORAL_TABLET | Freq: Three times a day (TID) | ORAL | Status: DC | PRN

## 2015-05-19 NOTE — Telephone Encounter (Signed)
Patient cancelled day of appt.

## 2015-05-19 NOTE — Progress Notes (Signed)
Melanie [name]  MRN: [id number] DOB: 1975-11-15  Subjective:  Pt presents to clinic started 4 days ago with flu like symptoms or myalgias and high fevers.  She did an evisit through her insurance and was diagnosed with the flu and given tamilfu but it was to expensive so she did not get it.  Since then her urine is getting dark and she has not had a BM since her symptoms started.  She started vomiting this am but has had slight nausea since her symptoms started.  She has been trying to drink a little and her food consumption is very reduced.  She has had fevers for 4 days and feel terrible with her myalgias and headaches.  She does not have abd pain, urinary symptoms.  She feels terrible.  Home treatment - tylenol and motrin but does not feel like that has done much Flu vaccine - yes No one at home is sick with similar symptoms  She was diagnosed with a UTI early February  through an evisit recently and was put on macrobid and finished the 5 day course and her symptoms resolved.   Patient Active Problem List   Diagnosis Date Noted  . BMI 40.0-44.9, adult (HCC) 03/15/2015  . Hepatic cirrhosis (HCC) 06/08/2014  . Nonspecific abnormal results of liver function study 10/27/2013  . Depression with anxiety 06/06/2013  . Perennial allergic rhinitis 06/06/2013  . Infertility associated with anovulation 06/06/2013    Current Outpatient Prescriptions on File Prior to Visit  Medication Sig Dispense Refill  . busPIRone (BUSPAR) 10 MG tablet TAKE 1 TABLET BY MOUTH TWICE A DAY OR AS DIRECTED. 180 tablet 3  . citalopram (CELEXA) 20 MG tablet TAKE 1 TABLET (20 MG TOTAL) BY MOUTH DAILY. 90 tablet 3  . EPINEPHrine 0.3 mg/0.3 mL IJ SOAJ injection Inject 0.3 mLs (0.3 mg total) into the muscle once. 2 Device 1  . fluticasone (FLONASE) 50 MCG/ACT nasal spray Place 2 sprays into both nostrils daily. 48 g 3  . loratadine (CLARITIN) 10 MG tablet Take 10 mg by mouth daily.    Marland Kitchen LORazepam (ATIVAN) 1 MG tablet  Take 1 tablet (1 mg total) by mouth 2 (two) times daily as needed for anxiety. 30 tablet 2  . montelukast (SINGULAIR) 10 MG tablet TAKE 1 TABLET (10 MG TOTAL) BY MOUTH AT BEDTIME. 30 tablet 5  . Multiple Vitamin (MULTIVITAMIN) tablet Take 1 tablet by mouth daily.    . Omega-3 Fatty Acids (FISH OIL PO) Take 1 tablet by mouth daily. Reported on 05/19/2015    . clobetasol ointment (TEMOVATE) 0.05 % Apply 1 application topically 2 (two) times daily. (Patient not taking: Reported on 05/19/2015) 60 g 0  . methocarbamol (ROBAXIN) 500 MG tablet Take 1 tablet (500 mg total) by mouth every 8 (eight) hours as needed. (Patient not taking: Reported on 05/19/2015) 30 tablet 0  . oseltamivir (TAMIFLU) 75 MG capsule Take 1 capsule (75 mg total) by mouth 2 (two) times daily. (Patient not taking: Reported on 05/19/2015) 10 capsule 0   No current facility-administered medications on file prior to visit.    Allergies  Allergen Reactions  . Lidocaine Other (See Comments)    Flu like sx's  . Relafen [Nabumetone] Other (See Comments)    migraine  . Celebrex [Celecoxib] Rash  . Sulfa Antibiotics Rash    Review of Systems  Constitutional: Positive for fever and chills.  HENT: Negative for congestion and sore throat.   Respiratory: Negative for cough.  Gastrointestinal: Positive for nausea, vomiting (today it started) and constipation. Negative for abdominal pain.  Genitourinary: Negative for dysuria, urgency and vaginal discharge.  Musculoskeletal: Positive for myalgias. Negative for back pain.  Neurological: Positive for headaches.   Objective:  BP 110/76 mmHg  Pulse 118  Temp(Src) 100 F (37.8 C) (Oral)  Resp 16  Ht 5\' 9"  (1.753 m)  Wt 298 lb (135.172 kg)  BMI 43.99 kg/m2  SpO2 97%  LMP 04/19/2015  Physical Exam  Constitutional: She is oriented to person, place, and time and well-developed, well-nourished, and in no distress.  Pt appears to not feel well  HENT:  Head: Normocephalic and atraumatic.    Right Ear: Hearing and external ear normal.  Left Ear: Hearing and external ear normal.  Eyes: Conjunctivae are normal.  Neck: Normal range of motion.  Cardiovascular: Regular rhythm and normal heart sounds.  Tachycardia present.   No murmur heard. Pulmonary/Chest: Effort normal and breath sounds normal. She has no wheezes.  Abdominal: Soft. There is tenderness (generalized discomfort). There is CVA tenderness (left side).  Neurological: She is alert and oriented to person, place, and time. Gait normal.  Skin: Skin is warm and dry.  Psychiatric: Mood, memory, affect and judgment normal.  Vitals reviewed.  No data found.  Results for orders placed or performed in visit on 05/19/15  Urine culture  Result Value Ref Range   Colony Count >=100,000 COLONIES/ML    Preliminary Report ESCHERICHIA COLI   COMPLETE METABOLIC PANEL WITH GFR  Result Value Ref Range   Sodium 138 135 - 146 mmol/L   Potassium 4.1 3.5 - 5.3 mmol/L   Chloride 100 98 - 110 mmol/L   CO2 26 20 - 31 mmol/L   Glucose, Bld 137 (H) 65 - 99 mg/dL   BUN 11 7 - 25 mg/dL   Creat 2.95 2.84 - 1.32 mg/dL   Total Bilirubin 3.4 (H) 0.2 - 1.2 mg/dL   Alkaline Phosphatase 184 (H) 33 - 115 U/L   AST 92 (H) 10 - 30 U/L   ALT 186 (H) 6 - 29 U/L   Total Protein 7.0 6.1 - 8.1 g/dL   Albumin 3.9 3.6 - 5.1 g/dL   Calcium 9.5 8.6 - 44.0 mg/dL   GFR, Est African American 78 >=60 mL/min   GFR, Est Non African American 68 >=60 mL/min  Hemoglobin A1c  Result Value Ref Range   Hgb A1c MFr Bld 5.8 (H) <5.7 %   Mean Plasma Glucose 120 (H) <117 mg/dL  POCT urinalysis dipstick  Result Value Ref Range   Color, UA orange (A) yellow   Clarity, UA cloudy (A) clear   Glucose, UA =100 (A) negative   Bilirubin, UA small (A) negative   Ketones, POC UA small (15) (A) negative   Spec Grav, UA 1.015    Blood, UA large (A) negative   pH, UA 5.5    Protein Ur, POC >=300 (A) negative   Urobilinogen, UA 2.0    Nitrite, UA Positive (A) Negative    Leukocytes, UA small (1+) (A) Negative  POCT glucose (manual entry)  Result Value Ref Range   POC Glucose 137 (A) 70 - 99 mg/dl  POCT CBC  Result Value Ref Range   WBC 10.4 (A) 4.6 - 10.2 K/uL   Lymph, poc 1.2 0.6 - 3.4   POC LYMPH PERCENT 11.8 10 - 50 %L   MID (cbc) 0.5 0 - 0.9   POC MID % 5.0 0 - 12 %M  POC Granulocyte 8.7 (A) 2 - 6.9   Granulocyte percent 83.2 (A) 37 - 80 %G   RBC 4.00 (A) 4.04 - 5.48 M/uL   Hemoglobin 13.0 12.2 - 16.2 g/dL   HCT, POC 16.1 (A) 09.6 - 47.9 %   MCV 89.9 80 - 97 fL   MCH, POC 32.6 (A) 27 - 31.2 pg   MCHC 36.2 (A) 31.8 - 35.4 g/dL   RDW, POC 04.5 %   Platelet Count, POC 160 142 - 424 K/uL   MPV 7.8 0 - 99.8 fL  POCT Microscopic Urinalysis ([facility])  Result Value Ref Range   WBC,UR,HPF,POC Too numerous to count  (A) None WBC/hpf   RBC,UR,HPF,POC Many (A) None RBC/hpf   Bacteria Moderate (A) None, Too numerous to count   Mucus Absent Absent   Epithelial Cells, UR Per Microscopy Moderate (A) None, Too numerous to count cells/hpf      Assessment and Plan :  Dehydration - Plan: Orthostatic vital signs, COMPLETE METABOLIC PANEL WITH GFR, Orthostatic vital signs, Discontinue IV  Dark urine - Plan: POCT urinalysis dipstick, POCT Microscopic Urinalysis ([facility])  Slow transit constipation - Plan: polyethylene glycol powder (GLYCOLAX/MIRALAX) powder  Flu-like symptoms - Plan: POCT CBC, ibuprofen (ADVIL,MOTRIN) tablet 600 mg  Glucose found in urine on examination - Plan: POCT glucose (manual entry)  Nausea and vomiting, intractability of vomiting not specified, unspecified vomiting type - Plan: ondansetron (ZOFRAN ODT) 4 MG disintegrating tablet, ondansetron (ZOFRAN-ODT) disintegrating tablet 4 mg  Hyperglycemia - Plan: Hemoglobin A1c  Acute pyelonephritis - Plan: ciprofloxacin (CIPRO) 500 MG tablet, cefTRIAXone (ROCEPHIN) injection 1 g, Urine culture  I suspect that the patient did not have the flu but rather pyelonephritis - ? Related to last  months UTI though it seems like a long time.  We gave the patient 2L NS and she felt only slightly better.  We discussed constipation likely resulting from decrease drinking and food intake and she will start with fluids and then advance her diet as tolerate - I suspect this will improve as her infection is treated.  She will f/u in 24h if she is worse or 48h if not improving.  Her questions were answered and she agrees with the plan.  Benny Lennert PA-C  [facility] 05/22/2015 11:16 AM

## 2015-05-19 NOTE — Telephone Encounter (Signed)
Phone thru answ service--patient of [place] PA-C 630am Fever 102 for 3-4 days -poor intake  Flu dx by evisit 3/6 started tamiflu Now vomiting and brown urine See hx cirrhosis No dysuria or diarrhea Not coughing  I advise coming in to [facility] at 8am for assessment and probable IV fluids

## 2015-05-19 NOTE — Telephone Encounter (Signed)
She is in the clinic.

## 2015-05-19 NOTE — Patient Instructions (Addendum)
Colace - to help with stool hardness - we want to get it soft Miralax - for constipation   Because you received labwork today, you will receive an invoice from [employer]. Please contact [facility] at [phone] with questions or concerns regarding your invoice. Our billing staff will not be able to assist you with those questions.  You will be contacted with the lab results as soon as they are available. The fastest way to get your results is to activate your My Chart account. Instructions are located on the last page of this paperwork. If you have not heard from [place] regarding the results in 2 weeks, please contact this office.

## 2015-05-21 ENCOUNTER — Telehealth: Payer: Self-pay

## 2015-05-21 NOTE — Telephone Encounter (Signed)
Msg is for Melanie [name], Pt was following up with her from Wednesday Symptoms are weakness, body aches are not as bad, and fever has subsided. She wants to know if these symptoms are okay or if she needs to be reevaluated.  Please advise  [phone]

## 2015-05-22 LAB — URINE CULTURE: Colony Count: >=100000

## 2015-05-22 NOTE — Telephone Encounter (Signed)
Called patient. LMOM that she should check her mychart.

## 2015-05-24 ENCOUNTER — Encounter: Payer: Self-pay | Admitting: Neurology

## 2015-05-24 ENCOUNTER — Other Ambulatory Visit: Payer: Self-pay | Admitting: Physician Assistant

## 2015-06-01 ENCOUNTER — Encounter: Payer: Self-pay | Admitting: Neurology

## 2015-06-01 ENCOUNTER — Ambulatory Visit (INDEPENDENT_AMBULATORY_CARE_PROVIDER_SITE_OTHER): Payer: BC Managed Care – PPO | Admitting: Neurology

## 2015-06-01 VITALS — BP 130/84 | HR 70 | Resp 16 | Ht 69.0 in | Wt 296.0 lb

## 2015-06-01 DIAGNOSIS — R519 Headache, unspecified: Secondary | ICD-10-CM

## 2015-06-01 DIAGNOSIS — G4719 Other hypersomnia: Secondary | ICD-10-CM | POA: Diagnosis not present

## 2015-06-01 DIAGNOSIS — R351 Nocturia: Secondary | ICD-10-CM | POA: Diagnosis not present

## 2015-06-01 DIAGNOSIS — R51 Headache: Secondary | ICD-10-CM

## 2015-06-01 DIAGNOSIS — R0683 Snoring: Secondary | ICD-10-CM

## 2015-06-01 NOTE — Patient Instructions (Signed)
Based on your symptoms and your exam I believe you are at risk for obstructive sleep apnea or OSA, and I think we should proceed with a sleep study to determine whether you do or do not have OSA and how severe it is. If you have more than mild OSA, I want you to consider treatment with CPAP. Please remember, the risks and ramifications of moderate to severe obstructive sleep apnea or OSA are: Cardiovascular disease, including congestive heart failure, stroke, difficult to control hypertension, arrhythmias, and even type 2 diabetes has been linked to untreated OSA. Sleep apnea causes disruption of sleep and sleep deprivation in most cases, which, in turn, can cause recurrent headaches, problems with memory, mood, concentration, focus, and vigilance. Most people with untreated sleep apnea report excessive daytime sleepiness, which can affect their ability to drive. Please do not drive if you feel sleepy.   I will likely see you back after your sleep study to go over the test results and where to go from there. We will call you after your sleep study to advise about the results (most likely, you will hear from Beverlee Nims, my nurse) and to set up an appointment at the time, as necessary.    Our sleep lab administrative assistant, Arrie Aran will meet with you or call you to schedule your sleep study. If you don't hear back from her by next week please feel free to call her at [phone]. This is her direct line and please leave a message with your phone number to call back if you get the voicemail box. She will call back as soon as possible.   Please remember to try to maintain good sleep hygiene, which means: Keep a regular sleep and wake schedule, try not to exercise or have a meal within 2 hours of your bedtime, try to keep your bedroom conducive for sleep, that is, cool and dark, without light distractors such as an illuminated alarm clock, and refrain from watching TV right before sleep or in the middle of the night  and do not keep the TV or radio on during the night. Also, try not to use or play on electronic devices at bedtime, such as your cell phone, tablet PC or laptop. If you like to read at bedtime on an electronic device, try to dim the background light as much as possible. Do not eat in the middle of the night.

## 2015-06-01 NOTE — Progress Notes (Signed)
Subjective:    Patient ID: Melanie [name] is a 40 y.o. female.  HPI     Star Age, MD, PhD [facility] [address], [address] [address] [place], [place] [address]  Dear Dr. Rosana Hoes,  I saw your patient, Melanie [name], upon your kind request in my neurologic clinic today for initial consultation of her sleep disorder, in particular, concern for underlying obstructive sleep apnea. The patient is unaccompanied today. Of note, the patient no showed for an appointment on 05/19/2015. As you know, Ms. Mruk is a 40 year old right-handed woman with an underlying medical history of allergies, anxiety, depression, abnormal LFTs, and morbid obesity, who reports snoring and excessive daytime somnolence, as well as witnessed apneic breathing pauses and morning headaches, difficulty initiating and maintaining sleep at times. She had a tonsillectomy and adenoidectomy as a child. She has a longer standing history of difficulty initiating and maintaining sleep. She feels it is hard for her to shut down at night. She sometimes watches TV in the family room until she feels sleepy enough to go to bed. Sometimes and that she reads or uses her cell phone. She does not typically watch TV in bed. Her husband sleeps with a CPAP but it does not disturb her. She has woken herself up with her snoring and a sense of gasping for air. She has had occasional morning headaches and nocturia is about once per night. She denies restless leg symptoms. Sometimes one of her cats jumped onto the bed and disturbs her sleep. She has tried over-the-counter sleep aids including p.m. type medications and melatonin and while these medications can work for a couple of days, they also keep her quite groggy during the day so she does not usually take any of these medications during the workweek. She has also been tried in the past on prescription sleeping pills including Ambien and Lunesta and perhaps Remeron and again she  did not have any sustained results with any of the prescription medications and also suffered from daytime grogginess. She suspects that there is family history of sleep apnea, at least in her father but both parents snored quite loudly she recalls that she was growing up. Bedtime is around 10 PM and he can take her long to fall asleep. Rise time is around 5:30, she does not typically wake up rested and often feels tired first thing in the morning. She does not drink caffeine on a regular basis, occasional cola. She is a nonsmoker and does not drink alcohol. She lives at home with her husband and 3 foster children ages 75, 80 and 28, she is in the process of adopting her foster children. She works as a Pharmacist, hospital at [employer]. Her Epworth sleepiness score is 14 out of 24 today, her fatigue score is 45 out of 63.    Her Past Medical History Is Significant For: Past Medical History  Diagnosis Date  . Allergy   . Anxiety   . Depression   . Membranous nephrosis     Her Past Surgical History Is Significant For: Past Surgical History  Procedure Laterality Date  . Knee surgery Left   . Wrist cyst Left   . Finger surgery Left      index finger  . Tonsillectomy and adenoidectomy  1978  . Tympanostomy tube placement    . Liver biopsy N/A 05/15/2014    Procedure: LIVER BIOPSY;  Surgeon: Inda Castle, MD;  Location: [facility];  Service: Endoscopy;  Laterality: N/A;  ultrasound to mark the liver    Her Family History Is Significant For: Family History  Problem Relation Age of Onset  . Diabetes Mother   . Liver disease Mother     ESLD- unknown etiology  . Diabetes Maternal Grandmother   . Diabetes Paternal Grandmother   . Breast cancer Paternal Grandmother   . Stroke Maternal Grandfather     Her Social History Is Significant For: Social History   Social History  . Marital Status: Married    Spouse Name: Thayer Jew  . Number of Children: 0  . Years of Education: College+   Occupational History  .  TEACHER [employer]    [employer]   Social History Main Topics  . Smoking status: Never Smoker   . Smokeless tobacco: Never Used  . Alcohol Use: No  . Drug Use: No  . Sexual Activity:    Partners: Male    Birth Control/ Protection: Post-menopausal   Other Topics Concern  . None   Social History Narrative   Lives with her husband and their 3 children. Currently fostering these children with intention to adopt them as a sibling group.   Occasionally drinks coke or pepsi     Her Allergies Are:  Allergies  Allergen Reactions  . Lidocaine Other (See Comments)    Flu like sx's  . Relafen [Nabumetone] Other (See Comments)    migraine  . Celebrex [Celecoxib] Rash  . Sulfa Antibiotics Rash  :   Her Current Medications Are:  Outpatient Encounter Prescriptions as of 06/01/2015  Medication Sig  . busPIRone (BUSPAR) 10 MG tablet TAKE 1 TABLET BY MOUTH TWICE A DAY OR AS DIRECTED.  . ciprofloxacin (CIPRO) 500 MG tablet Take 1 tablet (500 mg total) by mouth 2 (two) times daily.  . citalopram (CELEXA) 20 MG tablet TAKE 1 TABLET (20 MG TOTAL) BY MOUTH DAILY.  . clobetasol ointment (TEMOVATE) 1.47 % Apply 1 application topically 2 (two) times daily.  Marland Kitchen EPINEPHrine 0.3 mg/0.3 mL IJ SOAJ injection Inject 0.3 mLs (0.3 mg total) into the muscle once.  . fluticasone (FLONASE) 50 MCG/ACT nasal spray Place 2 sprays into both nostrils daily.  Marland Kitchen loratadine (CLARITIN) 10 MG tablet Take 10 mg by mouth daily.  Marland Kitchen LORazepam (ATIVAN) 1 MG tablet Take 1 tablet (1 mg total) by mouth 2 (two) times daily as needed for anxiety.  . methocarbamol (ROBAXIN) 500 MG tablet Take 1 tablet (500 mg total) by mouth every 8 (eight) hours as needed.  . montelukast (SINGULAIR) 10 MG tablet TAKE 1 TABLET (10 MG TOTAL) BY MOUTH AT BEDTIME.  . Multiple Vitamin (MULTIVITAMIN) tablet Take 1 tablet by mouth daily.  . Omega-3 Fatty Acids (FISH OIL PO) Take 1 tablet by mouth daily. Reported on 05/19/2015  . ondansetron (ZOFRAN  ODT) 4 MG disintegrating tablet Take 1 tablet (4 mg total) by mouth every 8 (eight) hours as needed for nausea.  . polyethylene glycol powder (GLYCOLAX/MIRALAX) powder Take 17 g by mouth daily.  . [DISCONTINUED] oseltamivir (TAMIFLU) 75 MG capsule Take 1 capsule (75 mg total) by mouth 2 (two) times daily. (Patient not taking: Reported on 05/19/2015)   No facility-administered encounter medications on file as of 06/01/2015.  :  Review of Systems:  Out of a complete 14 point review of systems, all are reviewed and negative with the exception of these symptoms as listed below:   Review of Systems  Allergic/Immunologic: Positive for environmental allergies.  Neurological:       Patient  reports trouble falling and staying asleep, snoring, wakes up gasping for air, wakes up feeling tired, morning headaches, daytime tiredness, denies taking naps.   Psychiatric/Behavioral:       Depression, anxiety, not enough sleep  Epworth Sleepiness Scale 0= would never doze 1= slight chance of dozing 2= moderate chance of dozing 3= high chance of dozing  Sitting and reading:3 Watching TV:2 Sitting inactive in a public place (ex. Theater or meeting):1 As a passenger in a car for an hour without a break:3 Lying down to rest in the afternoon:2 Sitting and talking to someone:1 Sitting quietly after lunch (no alcohol):1 In a car, while stopped in traffic:1 Total:14   Objective:  Neurologic Exam  Physical Exam Physical Examination:   Filed Vitals:   06/01/15 1324  BP: 130/84  Pulse: 70  Resp: 16    General Examination: The patient is a very pleasant 40 y.o. female in no acute distress. She appears well-developed and well-nourished and very well groomed.  She is obese.   HEENT: Normocephalic, atraumatic, pupils are equal, round and reactive to light and accommodation. Funduscopic exam is normal with sharp disc margins noted.  she has corrective eyeglasses. Extraocular tracking is good without  limitation to gaze excursion or nystagmus noted. Normal smooth pursuit is noted. Hearing is grossly intact. Tympanic membranes are clear bilaterally. Face is symmetric with normal facial animation and normal facial sensation. Speech is clear with no dysarthria noted. There is no hypophonia. There is no lip, neck/head, jaw or voice tremor. Neck is supple with full range of passive and active motion. There are no carotid bruits on auscultation. Oropharynx exam reveals: mild mouth dryness, good dental hygiene and mild airway crowding, due to smaller airway entryllampati is class II. Tongue protrudes centrally and palate elevates symmetrically. Tonsils are absent. Neck size is 16 1/8 inches. She has a Moderate overbite. Nasal inspection reveals no significant nasal mucosal bogginess or redness and no septal deviation, but she does appear to have a smaller nasal anatomy is well .   Chest: Clear to auscultation without wheezing, rhonchi or crackles noted.  Heart: S1+S2+0, regular and normal without murmurs, rubs or gallops noted.   Abdomen: Soft, non-tender and non-distended with normal bowel sounds appreciated on auscultation.  Extremities: There is no pitting edema in the distal lower extremities bilaterally. Pedal pulses are intact.  Skin: Warm and dry without trophic changes noted. There are no varicose veins.  Musculoskeletal: exam reveals no obvious joint deformities, tenderness or joint swelling or erythema.   Neurologically:  Mental status: The patient is awake, alert and oriented in all 4 spheres. Her immediate and remote memory, attention, language skills and fund of knowledge are appropriate. There is no evidence of aphasia, agnosia, apraxia or anomia. Speech is clear with normal prosody and enunciation. Thought process is linear. Mood is normal and affect is normal.  Cranial nerves II - XII are as described above under HEENT exam. In addition: shoulder shrug is normal with equal shoulder height  noted. Motor exam: Normal bulk, strength and tone is noted. There is no drift, tremor or rebound. Romberg is negative. Reflexes are 2+ throughout. Babinski: Toes are flexor bilaterally. Fine motor skills and coordination: intact with normal finger taps, normal hand movements, normal rapid alternating patting, normal foot taps and normal foot agility.  Cerebellar testing: No dysmetria or intention tremor on finger to nose testing. Heel to shin is unremarkable bilaterally. There is no truncal or gait ataxia.  Sensory exam: intact to light touch,  pinprick, vibration, temperature sense in the upper and lower extremities.  Gait, station and balance: She stands easily. No veering to one side is noted. No leaning to one side is noted. Posture is age-appropriate and stance is narrow based. Gait shows normal stride length and normal pace. No problems turning are noted. Tandem walk is unremarkable.               Assessment and Plan:  In summary, AVYANNA SPADA is a very pleasant 40 y.o.-year old female with an underlying medical history of allergies, anxiety, depression, abnormal LFTs, and morbid obesity, whose history and physical exam are indeed concerning for obstructive sleep apnea (OSA). I had a long chat with the patient about my findings and the diagnosis of OSA, its prognosis and treatment options. We talked about medical treatments, surgical interventions and non-pharmacological approaches. I explained in particular the risks and ramifications of untreated moderate to severe OSA, especially with respect to developing cardiovascular disease down the Road, including congestive heart failure, difficult to treat hypertension, cardiac arrhythmias, or stroke. Even type 2 diabetes has, in part, been linked to untreated OSA. Symptoms of untreated OSA include daytime sleepiness, memory problems, mood irritability and mood disorder such as depression and anxiety, lack of energy, as well as recurrent headaches,  especially morning headaches. We talked about trying to maintain a healthy lifestyle in general, as well as the importance of weight control. I encouraged the patient to eat healthy, exercise daily and keep well hydrated, to keep a scheduled bedtime and wake time routine, to not skip any meals and eat healthy snacks in between meals. I advised the patient not to drive when feeling sleepy. I recommended the following at this time: sleep study with potential positive airway pressure titration. (We will score hypopneas at 3% and split the sleep study into diagnostic and treatment portion, if the estimated. 2 hour AHI is >15/h).   I explained the sleep test procedure to the patient and also outlined possible surgical and non-surgical treatment options of OSA, including the use of a custom-made dental device (which would require a referral to a specialist dentist or oral surgeon), upper airway surgical options, such as pillar implants, radiofrequency surgery, tongue base surgery, and UPPP (which would involve a referral to an ENT surgeon). Rarely, jaw surgery such as mandibular advancement may be considered.  I also explained the CPAP treatment option to the patient, who indicated that she would be willing to try CPAP if the need arises. I explained the importance of being compliant with PAP treatment, not only for insurance purposes but primarily to improve Her symptoms, and for the patient's long term health benefit, including to reduce Her cardiovascular risks. I answered all her questions today and the patient was in agreement. I would like to see her back after the sleep study is completed and encouraged her to call with any interim questions, concerns, problems or updates.   Thank you very much for allowing me to participate in the care of this nice patient. If I can be of any further assistance to you please do not hesitate to call me at [phone].  Sincerely,   Star Age, MD, PhD

## 2015-06-06 ENCOUNTER — Encounter: Payer: Self-pay | Admitting: *Deleted

## 2015-06-08 ENCOUNTER — Ambulatory Visit (INDEPENDENT_AMBULATORY_CARE_PROVIDER_SITE_OTHER): Payer: BC Managed Care – PPO | Admitting: Neurology

## 2015-06-08 DIAGNOSIS — G4733 Obstructive sleep apnea (adult) (pediatric): Secondary | ICD-10-CM | POA: Diagnosis not present

## 2015-06-08 DIAGNOSIS — G479 Sleep disorder, unspecified: Secondary | ICD-10-CM

## 2015-06-08 DIAGNOSIS — G4761 Periodic limb movement disorder: Secondary | ICD-10-CM

## 2015-06-08 NOTE — Sleep Study (Signed)
Please see the scanned sleep study interpretation located in the Procedure tab within the Chart Review section. 

## 2015-06-09 ENCOUNTER — Telehealth: Payer: Self-pay | Admitting: Neurology

## 2015-06-09 DIAGNOSIS — G4733 Obstructive sleep apnea (adult) (pediatric): Secondary | ICD-10-CM

## 2015-06-09 NOTE — Telephone Encounter (Signed)
Diana:  Patient referred by Dr. Rosana Hoes, dentist, seen by me on 06/01/15, split study on 06/08/15, Ins: [employer]. Please call and notify patient that the recent sleep study confirmed the diagnosis of moderate to severe OSA. She did well with CPAP during the study with significant improvement of the respiratory events. Therefore, I would like start the patient on CPAP therapy at home by prescribing a machine for home use. I placed the order in the chart. The patient will need a follow up appointment with me in 8 to 10 weeks post set up that has to be scheduled; please go ahead and schedule while you have the patient on the phone and make sure patient understands the importance of keeping this window for the FU appointment, as it is often an insurance requirement and failing to adhere to this may result in losing coverage for sleep apnea treatment.  Please re-enforce the importance of compliance with treatment and the need for [place] to monitor compliance data - again an insurance requirement and good feedback for the patient as far as how they are doing.  Also remind patient, that any upcoming CPAP machine or mask issues, should be first addressed with the DME company. Please ask if patient has a preference regarding DME company.  Please arrange for CPAP set up at home through a DME company of patient's choice - once you have spoken to the patient - and faxed/routed report to PCP and referring MD (if other than PCP), you can close this encounter, thanks,   Star Age, MD, PhD [facility] ([place])

## 2015-06-10 NOTE — Telephone Encounter (Signed)
Patient is returning your call.  

## 2015-06-10 NOTE — Telephone Encounter (Signed)
I spoke to patient and she is aware of results and recommendations. She is willing to start CPAP treatment. I was able to make f/u appt with her. I will send her a letter stressing the importance of compliance.

## 2015-06-10 NOTE — Telephone Encounter (Signed)
LM for patient to call back.

## 2015-06-10 NOTE — Telephone Encounter (Signed)
Pt returned call about test results.

## 2015-07-04 ENCOUNTER — Telehealth: Payer: Self-pay | Admitting: Physician Assistant

## 2015-07-04 NOTE — Telephone Encounter (Signed)
"  As part of dental examination, [place] is now screening for signs of possible sleep apnea.  This patient reported that she has been diagnosed with OSA and has had problems using CPAP therapy. She is interested in considering oral appliance therapy."  Please call the Dental Sleep Lab Patient Coordinator, Urbano Heir [phone])  I took over as PCP for this patient 03/15/2015. At that time, she did not report any symptoms suggestive of OSA.   She saw Dr. Rexene Alberts for OSA evaluation on 06/01/2015, referred by Dr. Rosana Hoes, her dentist. Sleep study on 06/08/2015 confirmed moderate-severe OSA. She did well during the study on CPAP and home set up was ordered, with follow-up planned 8-10 weeks out.  As such, I am not comfortable recommending an oral appliance for this patient at this time. However, if she and Dr. Rexene Alberts are in agreement that an oral appliance would benefit her, I have no problem signing off on that.

## 2015-07-05 ENCOUNTER — Telehealth: Payer: Self-pay | Admitting: Neurology

## 2015-07-05 DIAGNOSIS — G4733 Obstructive sleep apnea (adult) (pediatric): Secondary | ICD-10-CM

## 2015-07-05 NOTE — Telephone Encounter (Signed)
LM on cell that we are sending order in.

## 2015-07-05 NOTE — Telephone Encounter (Signed)
Referral sent with sleep study and insurance. Telephone - [phone] fax [phone].

## 2015-07-05 NOTE — Telephone Encounter (Signed)
[facility] [phone] called requesting order for oral appliance. Please fax to [phone].

## 2015-07-05 NOTE — Telephone Encounter (Signed)
[facility] for order?

## 2015-07-05 NOTE — Telephone Encounter (Signed)
Okay to proceed with Oral appliance, per patient preference. Referral placed. Please fax to [facility].

## 2015-07-07 NOTE — Telephone Encounter (Signed)
Kayla advised.

## 2015-08-12 ENCOUNTER — Telehealth: Payer: Self-pay | Admitting: *Deleted

## 2015-08-12 NOTE — Telephone Encounter (Signed)
Message For: OFFICE               Taken  1-JUN-17 at  9:31AM by [facility] ------------------------------------------------------------ Melanie [name]                CID [id number]  Patient SAME                 Pt's Dr Rexene Alberts        Area Code 597 Phone# [phone] * [phone]    RE CANCEL APPT 6/6 @ 4PM,                            PLS C/B TO RESCHEDULE                                Disp:Y/N N If Y = C/B If No Response In 75mnutes ============================================================

## 2015-08-17 ENCOUNTER — Ambulatory Visit: Payer: Self-pay | Admitting: Neurology

## 2015-08-27 ENCOUNTER — Ambulatory Visit (INDEPENDENT_AMBULATORY_CARE_PROVIDER_SITE_OTHER): Payer: BC Managed Care – PPO | Admitting: Osteopathic Medicine

## 2015-08-27 VITALS — BP 132/74 | HR 74 | Temp 98.0°F | Resp 16 | Ht 68.25 in | Wt 315.2 lb

## 2015-08-27 DIAGNOSIS — R945 Abnormal results of liver function studies: Secondary | ICD-10-CM

## 2015-08-27 DIAGNOSIS — K7581 Nonalcoholic steatohepatitis (NASH): Secondary | ICD-10-CM | POA: Diagnosis not present

## 2015-08-27 DIAGNOSIS — R609 Edema, unspecified: Secondary | ICD-10-CM | POA: Insufficient documentation

## 2015-08-27 DIAGNOSIS — F411 Generalized anxiety disorder: Secondary | ICD-10-CM

## 2015-08-27 DIAGNOSIS — R7989 Other specified abnormal findings of blood chemistry: Secondary | ICD-10-CM | POA: Diagnosis not present

## 2015-08-27 DIAGNOSIS — E669 Obesity, unspecified: Secondary | ICD-10-CM

## 2015-08-27 DIAGNOSIS — Z79899 Other long term (current) drug therapy: Secondary | ICD-10-CM

## 2015-08-27 LAB — TSH: TSH: 3.31 mIU/L

## 2015-08-27 LAB — CBC
HCT: 38.8 % (ref 35.0–45.0)
Hemoglobin: 13.3 g/dL (ref 11.7–15.5)
MCH: 31.1 pg (ref 27.0–33.0)
MCHC: 34.3 g/dL (ref 32.0–36.0)
MCV: 90.9 fL (ref 80.0–100.0)
MPV: 9.7 fL (ref 7.5–12.5)
PLATELETS: 231 10*3/uL (ref 140–400)
RBC: 4.27 MIL/uL (ref 3.80–5.10)
RDW: 13.7 % (ref 11.0–15.0)
WBC: 8.3 10*3/uL (ref 3.8–10.8)

## 2015-08-27 LAB — COMPLETE METABOLIC PANEL WITH GFR
ALBUMIN: 4.5 g/dL (ref 3.6–5.1)
ALK PHOS: 141 U/L — AB (ref 33–115)
ALT: 114 U/L — ABNORMAL HIGH (ref 6–29)
AST: 91 U/L — ABNORMAL HIGH (ref 10–30)
BILIRUBIN TOTAL: 1.3 mg/dL — AB (ref 0.2–1.2)
BUN: 12 mg/dL (ref 7–25)
CALCIUM: 10 mg/dL (ref 8.6–10.2)
CO2: 26 mmol/L (ref 20–31)
CREATININE: 0.67 mg/dL (ref 0.50–1.10)
Chloride: 101 mmol/L (ref 98–110)
Glucose, Bld: 118 mg/dL — ABNORMAL HIGH (ref 65–99)
Potassium: 3.9 mmol/L (ref 3.5–5.3)
Sodium: 136 mmol/L (ref 135–146)
TOTAL PROTEIN: 7.2 g/dL (ref 6.1–8.1)

## 2015-08-27 MED ORDER — BUPROPION HCL 75 MG PO TABS: 75.0000 mg | ORAL_TABLET | Freq: Two times a day (BID) | ORAL | Status: DC

## 2015-08-27 MED ORDER — FUROSEMIDE 20 MG PO TABS: 10.0000 mg | ORAL_TABLET | Freq: Every day | ORAL | Status: DC

## 2015-08-27 NOTE — Patient Instructions (Addendum)
     IF you received an x-ray today, you will receive an invoice from [facility]. Please contact [facility] at [phone] with questions or concerns regarding your invoice.   IF you received labwork today, you will receive an invoice from [facility] [employer]. Please contact [facility] at [phone] with questions or concerns regarding your invoice.   Our billing staff will not be able to assist you with questions regarding bills from these companies.  You will be contacted with the lab results as soon as they are available. The fastest way to get your results is to activate your My Chart account. Instructions are located on the last page of this paperwork. If you have not heard from [place] regarding the results in 2 weeks, please contact this office.    Return for labs in 2 - 3 weeks to monitor medication safety Call or return to clinic if any concerns of ir symptoms get worse!   Ask insurance about coverage for anti-obesity medications   Qsymia (Phentermine and Topiramate)  Saxenda (Liraglutide 3 mg/day)  Contrave (Bupropion and Naltrexone)  Orlistat (Xenical, Alli)  Bupropion (Wellbutrin)

## 2015-08-27 NOTE — Progress Notes (Signed)
HPI: Melanie [name] is a 40 y.o. female who presents to [facility] today for chief complaint of:  Chief Complaint  Patient presents with  . Edema    both legs, x 2 mos    EDEMA . Location: both legs . Quality: Swelling with some pitting, nonpainful . Duration: 2 months . Timing: Absent in the morning, gets worse throughout the day . Modifying factors: Recent weight gain . Assoc signs/symptoms: No shortness of breath, no orthopnea, no chest pain  ANXIETY/OVEREATING - patient reports she is going through adoption process with her 3 foster children, increased stress, eating habits have been a problem for her, she has noticed weight gain. Requests medication for obesity.  LIVER ENZYME ELEVATION - labs reviewed, significant liver enzyme elevation noted but appears to be stable over time. Patient states that she's had previous workup for this including liver biopsy, diagnosis of fatty liver.    Past medical, social and family history reviewed: Past Medical History  Diagnosis Date  . Allergy   . Anxiety   . Depression   . Membranous nephrosis    Past Surgical History  Procedure Laterality Date  . Knee surgery Left   . Wrist cyst Left   . Finger surgery Left      index finger  . Tonsillectomy and adenoidectomy  1978  . Tympanostomy tube placement    . Liver biopsy N/A 05/15/2014    Procedure: LIVER BIOPSY;  Surgeon: Inda Castle, MD;  Location: [facility];  Service: Endoscopy;  Laterality: N/A;  ultrasound to mark the liver   Social History  Substance Use Topics  . Smoking status: Never Smoker   . Smokeless tobacco: Never Used  . Alcohol Use: No   Family History  Problem Relation Age of Onset  . Diabetes Mother   . Liver disease Mother     ESLD- unknown etiology  . Diabetes Maternal Grandmother   . Diabetes Paternal Grandmother   . Breast cancer Paternal Grandmother   . Stroke Maternal Grandfather     Current Outpatient Prescriptions   Medication Sig Dispense Refill  . busPIRone (BUSPAR) 10 MG tablet TAKE 1 TABLET BY MOUTH TWICE A DAY OR AS DIRECTED. 180 tablet 3  . citalopram (CELEXA) 20 MG tablet TAKE 1 TABLET (20 MG TOTAL) BY MOUTH DAILY. 90 tablet 3  . EPINEPHrine 0.3 mg/0.3 mL IJ SOAJ injection Inject 0.3 mLs (0.3 mg total) into the muscle once. 2 Device 1  . fluticasone (FLONASE) 50 MCG/ACT nasal spray Place 2 sprays into both nostrils daily. 48 g 3  . loratadine (CLARITIN) 10 MG tablet Take 10 mg by mouth daily.    Marland Kitchen LORazepam (ATIVAN) 1 MG tablet Take 1 tablet (1 mg total) by mouth 2 (two) times daily as needed for anxiety. 30 tablet 2  . montelukast (SINGULAIR) 10 MG tablet TAKE 1 TABLET (10 MG TOTAL) BY MOUTH AT BEDTIME. 30 tablet 4  . Multiple Vitamin (MULTIVITAMIN) tablet Take 1 tablet by mouth daily.    . Omega-3 Fatty Acids (FISH OIL PO) Take 1 tablet by mouth daily. Reported on 05/19/2015    . polyethylene glycol powder (GLYCOLAX/MIRALAX) powder Take 17 g by mouth daily. 250 g 1  . clobetasol ointment (TEMOVATE) 0.10 % Apply 1 application topically 2 (two) times daily. (Patient not taking: Reported on 08/27/2015) 60 g 0  . methocarbamol (ROBAXIN) 500 MG tablet Take 1 tablet (500 mg total) by mouth every 8 (eight) hours as needed. (Patient  not taking: Reported on 08/27/2015) 30 tablet 0  . ondansetron (ZOFRAN ODT) 4 MG disintegrating tablet Take 1 tablet (4 mg total) by mouth every 8 (eight) hours as needed for nausea. (Patient not taking: Reported on 08/27/2015) 20 tablet 0   No current facility-administered medications for this visit.   Allergies  Allergen Reactions  . Lidocaine Other (See Comments)    Flu like sx's  . Relafen [Nabumetone] Other (See Comments)    migraine  . Celebrex [Celecoxib] Rash  . Sulfa Antibiotics Rash      Review of Systems: CONSTITUTIONAL:  No  fever, no chills, (+) unintentional weight changes HEAD/EYES/EARS/NOSE/THROAT: No  headache, no vision change CARDIAC: No  chest pain,  No  pressure, No palpitation, no orthopnea RESPIRATORY: No  cough, No  shortness of breath/wheeze GASTROINTESTINAL: No  nausea, No  vomiting, No  abdominal pain MUSCULOSKELETAL: No  Myalgia/arthralgia, (+) lower extremity edema as noted in history of present illness PSYCHIATRIC: No  concerns with depression, (+) concerns with anxiety, No sleep problems  Exam:  BP 132/74 mmHg  Pulse 74  Temp(Src) 98 F (36.7 C) (Oral)  Resp 16  Ht 5' 8.25" (1.734 m)  Wt 315 lb 3.2 oz (142.974 kg)  BMI 47.55 kg/m2  SpO2 94%  LMP 06/15/2015 Constitutional: VS see above. General Appearance: alert, well-developed, well-nourished, NAD Eyes: Normal lids and conjunctive, non-icteric sclera Neck: No masses, trachea midline. No thyroid enlargement/tenderness/mass appreciated. No lymphadenopathy Respiratory: Normal respiratory effort. no wheeze, no rhonchi, no rales Cardiovascular: S1/S2 normal, no murmur, no rub/gallop auscultated. RRR.  Trace lower extremity edema bilaterally, Homans sign negative bilaterally Musculoskeletal: Gait normal. No clubbing/cyanosis of digits.  Skin: warm, dry, intact. No rash/ulcer Psychiatric: Normal judgment/insight. Normal mood and affect. Oriented x3.   Records reviewed: seen March 2016 by GI, diagnosed with nonalcoholic steatohepatitis, concern for early hepatic cirrhosis. Most recent CMP 3 months ago, liver enzymes elevated, trending a bit upward from prior measurements but overall relatively stable.   ASSESSMENT/PLAN:   Patient educated most likely dependent edema, certainly she is not in clinical heart failure, consideration for exacerbation due to weight gain and poor eating habits. Advised avoid salt, compression stockings, will trial low-dose diuretic as needed, patient states she has been on Lasix in the past without any adverse event though she has had a significant hives reaction with Celebrex and there is alert noted for possible cross-reactivity, patient advised of  risks versus benefits, importance of routine lab monitoring for medication safety, lifestyle changes such as diet and exercise.   We'll add Wellbutrin to augment treatment for anxiety as well as help to curb appetite, did not think phentermine is advisable given her anxiety issues, patient was given a list of anti-obesity medications and encouraged to contact her insurance company to see if any of these are covered for long-term treatment and return to clinic for further discussion.   Patient to come back in 2-3 weeks for lab monitoring, particularly with regard to liver enzymes. ER/RTC precautions reviewed.  Dependent edema - Plan: furosemide (LASIX) 20 MG tablet, COMPLETE METABOLIC PANEL WITH GFR, TSH, CBC  Anxiety state - Plan: buPROPion (WELLBUTRIN) 75 MG tablet, TSH, CBC  Medication management - Plan: COMPLETE METABOLIC PANEL WITH GFR - recheck enzymes! Also reviewed pharmacy caution on possible increase of Citalopram serum concentrations with addition of Wellbutrin, risk seems low given low dose of both medications, will monitor closely, consider change SSRI at any rate since she is not getting sufficient benefit in terms of anxiety  treatment.   Abnormal LFTs - Plan: COMPLETE METABOLIC PANEL WITH GFR  Obesity - Plan: buPROPion (WELLBUTRIN) 75 MG tablet   NASH   Visit summary printed and instructions reviewed with the patient. All questions answered. Return in about 2 weeks (around 09/10/2015) for REPEAT LABS ON NEW MEDICINES - DISCUSS CONTINUATION OF MEDICATIONS.   Total time spent 40 minutes, greater than 50% of the visit was counseling and coordinating care for diagnosis of Dependent edema, Anxiety state, Medication management, Abnormal LFTs, Obesity, and NASH (nonalcoholic steatohepatitis). Marland Kitchen

## 2015-09-08 ENCOUNTER — Ambulatory Visit (INDEPENDENT_AMBULATORY_CARE_PROVIDER_SITE_OTHER): Payer: BC Managed Care – PPO | Admitting: Family Medicine

## 2015-09-08 VITALS — BP 132/80 | HR 84 | Temp 97.8°F | Resp 17 | Wt 310.0 lb

## 2015-09-08 DIAGNOSIS — F418 Other specified anxiety disorders: Secondary | ICD-10-CM

## 2015-09-08 DIAGNOSIS — F411 Generalized anxiety disorder: Secondary | ICD-10-CM | POA: Diagnosis not present

## 2015-09-08 DIAGNOSIS — E669 Obesity, unspecified: Secondary | ICD-10-CM | POA: Diagnosis not present

## 2015-09-08 DIAGNOSIS — R7989 Other specified abnormal findings of blood chemistry: Secondary | ICD-10-CM | POA: Diagnosis not present

## 2015-09-08 DIAGNOSIS — F419 Anxiety disorder, unspecified: Secondary | ICD-10-CM | POA: Diagnosis not present

## 2015-09-08 DIAGNOSIS — R609 Edema, unspecified: Secondary | ICD-10-CM | POA: Diagnosis not present

## 2015-09-08 DIAGNOSIS — R945 Abnormal results of liver function studies: Secondary | ICD-10-CM

## 2015-09-08 DIAGNOSIS — K7581 Nonalcoholic steatohepatitis (NASH): Secondary | ICD-10-CM | POA: Diagnosis not present

## 2015-09-08 LAB — COMPLETE METABOLIC PANEL WITH GFR
ALT: 105 U/L — ABNORMAL HIGH (ref 6–29)
AST: 86 U/L — ABNORMAL HIGH (ref 10–30)
Albumin: 4.7 g/dL (ref 3.6–5.1)
Alkaline Phosphatase: 160 U/L — ABNORMAL HIGH (ref 33–115)
BILIRUBIN TOTAL: 1.1 mg/dL (ref 0.2–1.2)
BUN: 11 mg/dL (ref 7–25)
CHLORIDE: 102 mmol/L (ref 98–110)
CO2: 28 mmol/L (ref 20–31)
Calcium: 10.1 mg/dL (ref 8.6–10.2)
Creat: 0.85 mg/dL (ref 0.50–1.10)
GFR, EST NON AFRICAN AMERICAN: 85 mL/min (ref >=60)
Glucose, Bld: 118 mg/dL — ABNORMAL HIGH (ref 65–99)
POTASSIUM: 4.3 mmol/L (ref 3.5–5.3)
SODIUM: 140 mmol/L (ref 135–146)
TOTAL PROTEIN: 7.4 g/dL (ref 6.1–8.1)

## 2015-09-08 MED ORDER — LORAZEPAM 1 MG PO TABS: 1.0000 mg | ORAL_TABLET | Freq: Two times a day (BID) | ORAL | Status: DC | PRN

## 2015-09-08 MED ORDER — BUPROPION HCL 75 MG PO TABS
75.0000 mg | ORAL_TABLET | Freq: Two times a day (BID) | ORAL | Status: DC
Start: 2015-09-08 — End: 2015-12-22

## 2015-09-08 MED ORDER — FUROSEMIDE 20 MG PO TABS: 10.0000 mg | ORAL_TABLET | Freq: Every day | ORAL | Status: DC

## 2015-09-08 NOTE — Progress Notes (Signed)
   HPI  Patient presents today here to follow up for edema and anxiety.   Edema Has improved, has small diuresis with lasix. Has been using compression stockings irregularly Overall happy with results  Elevated LFTs avoiding tylenol and alcool  Anxiety, depression Feeling better on wellbutrin, feels anxiety is better and denies any SI No side effects Very happy with results   PMH: Smoking status noted ROS: Per HPI  Objective: BP 132/80 mmHg  Pulse 84  Temp(Src) 97.8 F (36.6 C) (Oral)  Resp 17  Wt 310 lb (140.615 kg)  SpO2 97%  LMP 06/15/2015 Gen: NAD, alert, cooperative with exam HEENT: NCAT CV: RRR, good S1/S2, no murmur Resp: CTABL, no wheezes, non-labored Ext: No edema, warm Neuro: Alert and oriented, No gross deficits  Assessment and plan:  # Anxiety, depression improved on wellbutrin, prescribed 3 month RTC in 3 months  # edema Agree, dependent edema Continue lasix and compression stockings, refilled lasix  # elevated LFTs Trending down, 2/2 NASH Avoid tylenol and alcohol    Orders Placed This Encounter  Procedures  . COMPLETE METABOLIC PANEL WITH GFR    Meds ordered this encounter  Medications  . buPROPion (WELLBUTRIN) 75 MG tablet    Sig: Take 1 tablet (75 mg total) by mouth 2 (two) times daily.    Dispense:  60 tablet    Refill:  2    Please cancel previous prescription for dispense #30  . furosemide (LASIX) 20 MG tablet    Sig: Take 0.5 tablets (10 mg total) by mouth daily.    Dispense:  15 tablet    Refill:  2  . LORazepam (ATIVAN) 1 MG tablet    Sig: Take 1 tablet (1 mg total) by mouth 2 (two) times daily as needed for anxiety.    Dispense:  20 tablet    Refill:  0    Kenn File, MD 3:11 PM

## 2015-09-08 NOTE — Patient Instructions (Addendum)
  Great to meet you!  Come back to see [place] in 3 months     IF you received an x-ray today, you will receive an invoice from [facility]. Please contact [facility] at [phone] with questions or concerns regarding your invoice.   IF you received labwork today, you will receive an invoice from [facility]. Please contact [facility] at [phone] with questions or concerns regarding your invoice.   Our billing staff will not be able to assist you with questions regarding bills from these companies.  You will be contacted with the lab results as soon as they are available. The fastest way to get your results is to activate your My Chart account. Instructions are located on the last page of this paperwork. If you have not heard from [place] regarding the results in 2 weeks, please contact this office.

## 2015-09-19 ENCOUNTER — Other Ambulatory Visit: Payer: Self-pay | Admitting: Osteopathic Medicine

## 2015-10-19 ENCOUNTER — Telehealth: Payer: Self-pay

## 2015-10-19 NOTE — Telephone Encounter (Signed)
Sabrina from the sleep department at [facility] is calling to get an order form signed for the patient's oral appliance for sleep apnea.  Please contact [facility] at [phone].

## 2015-11-18 ENCOUNTER — Telehealth: Payer: Self-pay

## 2015-11-18 NOTE — Telephone Encounter (Signed)
error 

## 2015-11-23 ENCOUNTER — Encounter: Payer: Self-pay | Admitting: Physician Assistant

## 2015-11-23 DIAGNOSIS — G4733 Obstructive sleep apnea (adult) (pediatric): Secondary | ICD-10-CM | POA: Insufficient documentation

## 2015-11-23 NOTE — Telephone Encounter (Signed)
Spoke to pt who stated that she had talked w/Chelle about getting an oral appliance instead of CPAP because she anticipated having trouble sleeping with the CPAP machine on. However, she reports she is not having any trouble with it and is sleeping well and feeling better, so she does not think that she will need the oral appliance.

## 2015-11-23 NOTE — Telephone Encounter (Signed)
Form received from the patient's dentist regarding referral for oral appliance for treatment of OSA. Previous notes reviewed. Neurologist OK with this.  I need to know how long the patient tried CPAP, and why she did not tolerate it.  Please obtain that information from the patient so that I can sign the order/referral.  Thank you!

## 2015-11-23 NOTE — Telephone Encounter (Signed)
Ok.  I'll shred the form.

## 2015-11-26 ENCOUNTER — Encounter: Payer: Self-pay | Admitting: Physician Assistant

## 2015-12-12 ENCOUNTER — Other Ambulatory Visit: Payer: Self-pay | Admitting: Physician Assistant

## 2015-12-14 ENCOUNTER — Other Ambulatory Visit: Payer: Self-pay | Admitting: Family Medicine

## 2015-12-14 DIAGNOSIS — R609 Edema, unspecified: Secondary | ICD-10-CM

## 2015-12-22 ENCOUNTER — Other Ambulatory Visit: Payer: Self-pay | Admitting: Family Medicine

## 2015-12-22 DIAGNOSIS — E669 Obesity, unspecified: Secondary | ICD-10-CM

## 2015-12-22 DIAGNOSIS — F411 Generalized anxiety disorder: Secondary | ICD-10-CM

## 2015-12-24 ENCOUNTER — Other Ambulatory Visit: Payer: Self-pay

## 2015-12-24 DIAGNOSIS — R609 Edema, unspecified: Secondary | ICD-10-CM

## 2015-12-24 MED ORDER — FUROSEMIDE 20 MG PO TABS: 10.0000 mg | ORAL_TABLET | Freq: Every day | ORAL | 0 refills | Status: DC

## 2016-01-21 ENCOUNTER — Other Ambulatory Visit: Payer: Self-pay | Admitting: Urgent Care

## 2016-01-21 ENCOUNTER — Other Ambulatory Visit: Payer: Self-pay | Admitting: Family Medicine

## 2016-01-21 DIAGNOSIS — F411 Generalized anxiety disorder: Secondary | ICD-10-CM

## 2016-01-21 DIAGNOSIS — R609 Edema, unspecified: Secondary | ICD-10-CM

## 2016-01-21 DIAGNOSIS — E669 Obesity, unspecified: Secondary | ICD-10-CM

## 2016-02-14 ENCOUNTER — Other Ambulatory Visit: Payer: Self-pay | Admitting: Physician Assistant

## 2016-02-14 NOTE — Telephone Encounter (Signed)
08/2015 last ov and labs

## 2016-02-15 ENCOUNTER — Telehealth: Payer: Self-pay

## 2016-02-15 NOTE — Telephone Encounter (Signed)
I received an order form for a dental device for pt's osa from [facility] and [facility]. It appears that pt may be on cpap currently and does not need a dental device.  I called pt to discuss. She also needs a follow up with Dr. Rexene Alberts if she is indeed on cpap. No answer, left a message asking her to call me back.

## 2016-02-21 ENCOUNTER — Other Ambulatory Visit: Payer: Self-pay | Admitting: Family Medicine

## 2016-02-21 DIAGNOSIS — E669 Obesity, unspecified: Secondary | ICD-10-CM

## 2016-02-21 DIAGNOSIS — F411 Generalized anxiety disorder: Secondary | ICD-10-CM

## 2016-02-22 ENCOUNTER — Encounter: Payer: Self-pay | Admitting: Physician Assistant

## 2016-02-22 DIAGNOSIS — F411 Generalized anxiety disorder: Secondary | ICD-10-CM

## 2016-02-22 DIAGNOSIS — E669 Obesity, unspecified: Secondary | ICD-10-CM

## 2016-02-22 DIAGNOSIS — F419 Anxiety disorder, unspecified: Secondary | ICD-10-CM

## 2016-02-22 DIAGNOSIS — R609 Edema, unspecified: Secondary | ICD-10-CM

## 2016-02-22 NOTE — Telephone Encounter (Signed)
As per patient request, will refer to [facility].  For OA for OSA.  I reviewed her CPAP compliance data from 01/23/2016 through 02/21/2016 which is a total of 30 days, during which time she was 77% compliant with her CPAP, average usage of 5 hours and 37 minutes, residual AHI 1.2 per hour, leak low with the 95th percentile at 6.5 L/m on a pressure of 13 cm with EPR of 3. Mildly concern would be whether an oral appliance would be able to fully treat her moderate to severe obstructive sleep apnea. Patient would benefit from a repeat sleep study after she has been fitted with an oral appliance and stable with its usage. Please relay back to patient.

## 2016-02-22 NOTE — Telephone Encounter (Signed)
I spoke to Dr. Rexene Alberts. Dr. Rexene Alberts also recommends that pt continue using her cpap until the OA is completed, and if the HST after the OA is completed does not show treatment of osa, then pt may have to restart her cpap. Pt should call us if the HST still shows apnea, to discuss.  I spoke to pt. I advised her that Dr. Rexene Alberts did sign her paperwork for [facility] to allow her to have an OA, however, her concern is that pt's osa may not be completed treated by the OA. Dr. Rexene Alberts recommends that [facility] repeat a home sleep test with the OA to ensure the apnea is treated. If osa is still present, pt may need to go back on cpap. Pt should use cpap until her OA is completed. Pt should still keep cpap until the dentist reviews her HST and determines that her osa is treated by an OA adequately. Pt will call us if the HST with OA still shows apnea. Pt verbalized understanding. Will fax paperwork back to [facility] and [employer] [facility] [employer] [facility].

## 2016-02-22 NOTE — Telephone Encounter (Signed)
I called pt. I advised her that we received a notice from [facility] that pt is interested in an oral appliance and they need permission from Dr. Rexene Alberts.  Pt says that she is using the cpap ordered by Dr. Rexene Alberts, and it helps some, however, she also has TMJ and needs an oral appliance to treat her TMJ. Pt says that oftentimes the cpap keeps her awake with the cord and the mask, and pt is sure that she will not be able to sleep at all if she has to sleep with a cpap and a dental device.  Pt is asking that Dr. Rexene Alberts review her sleep study and let her know if an OA instead of cpap is an option.

## 2016-02-23 MED ORDER — BUPROPION HCL 75 MG PO TABS: 75.0000 mg | ORAL_TABLET | Freq: Two times a day (BID) | ORAL | 0 refills | Status: DC

## 2016-02-23 MED ORDER — FUROSEMIDE 20 MG PO TABS: 10.0000 mg | ORAL_TABLET | Freq: Every day | ORAL | 0 refills | Status: DC

## 2016-02-23 MED ORDER — LORAZEPAM 1 MG PO TABS: 1.0000 mg | ORAL_TABLET | Freq: Two times a day (BID) | ORAL | 0 refills | Status: DC | PRN

## 2016-02-23 NOTE — Addendum Note (Signed)
Addended by: Fara Chute on: 02/23/2016 08:03 AM   Modules accepted: Orders

## 2016-02-23 NOTE — Telephone Encounter (Signed)
Patient notified via My Chart.  Meds ordered this encounter  Medications  . buPROPion (WELLBUTRIN) 75 MG tablet    Sig: Take 1 tablet (75 mg total) by mouth 2 (two) times daily.    Dispense:  60 tablet    Refill:  0    Order Specific Question:   Supervising Provider    Answer:   SHAW, EVA N [[id number]]  . furosemide (LASIX) 20 MG tablet    Sig: Take 0.5 tablets (10 mg total) by mouth daily.    Dispense:  15 tablet    Refill:  0    PATIENT NEEDS OFFICE VISIT FOR ADDITIONAL REFILLS    Order Specific Question:   Supervising Provider    Answer:   Brigitte Pulse, EVA N [[id number]]  . LORazepam (ATIVAN) 1 MG tablet    Sig: Take 1 tablet (1 mg total) by mouth 2 (two) times daily as needed for anxiety.    Dispense:  20 tablet    Refill:  0    Order Specific Question:   Supervising Provider    Answer:   Brigitte Pulse, EVA N [[id number]]

## 2016-02-23 NOTE — Addendum Note (Signed)
Addended by: Fara Chute on: 02/23/2016 03:35 PM   Modules accepted: Orders

## 2016-03-21 ENCOUNTER — Ambulatory Visit: Payer: BC Managed Care – PPO

## 2016-03-22 ENCOUNTER — Ambulatory Visit (INDEPENDENT_AMBULATORY_CARE_PROVIDER_SITE_OTHER): Payer: BC Managed Care – PPO | Admitting: Physician Assistant

## 2016-03-22 ENCOUNTER — Other Ambulatory Visit: Payer: Self-pay | Admitting: Family Medicine

## 2016-03-22 VITALS — BP 132/82 | HR 76 | Temp 98.3°F | Resp 17 | Ht 69.5 in | Wt 321.0 lb

## 2016-03-22 DIAGNOSIS — F4323 Adjustment disorder with mixed anxiety and depressed mood: Secondary | ICD-10-CM

## 2016-03-22 DIAGNOSIS — H1033 Unspecified acute conjunctivitis, bilateral: Secondary | ICD-10-CM

## 2016-03-22 MED ORDER — BUSPIRONE HCL 10 MG PO TABS: ORAL_TABLET | ORAL | 3 refills | Status: DC

## 2016-03-22 MED ORDER — LORAZEPAM 1 MG PO TABS: 1.0000 mg | ORAL_TABLET | Freq: Two times a day (BID) | ORAL | 0 refills | Status: DC | PRN

## 2016-03-22 MED ORDER — MOXIFLOXACIN HCL 0.5 % OP SOLN: 1.0000 [drp] | Freq: Three times a day (TID) | OPHTHALMIC | 0 refills | Status: AC

## 2016-03-22 MED ORDER — BUPROPION HCL 75 MG PO TABS: 75.0000 mg | ORAL_TABLET | Freq: Two times a day (BID) | ORAL | 3 refills | Status: DC

## 2016-03-22 MED ORDER — CITALOPRAM HYDROBROMIDE 20 MG PO TABS: ORAL_TABLET | ORAL | 3 refills | Status: DC

## 2016-03-22 NOTE — Patient Instructions (Addendum)
IF you received an x-ray today, you will receive an invoice from [facility] Radiology. Please contact [facility]iology at [phone] with questions or concerns regarding your invoice.   IF you received labwork today, you will receive an invoice from [facility]. Please contact [facility] at [phone] with questions or concerns regarding your invoice.   Our billing staff will not be able to assist you with questions regarding bills from these companies.  You will be contacted with the lab results as soon as they are available. The fastest way to get your results is to activate your My Chart account. Instructions are located on the last page of this paperwork. If you have not heard from [place] regarding the results in 2 weeks, please contact this office.      Bacterial Conjunctivitis Bacterial conjunctivitis is an infection of the clear membrane that covers the white part of your eye and the inner surface of your eyelid (conjunctiva). When the blood vessels in your conjunctiva become inflamed, your eye becomes red or pink, and it will probably feel itchy. Bacterial conjunctivitis spreads very easily from person to person (is contagious). It also spreads easily from one eye to the other eye. What are the causes? This condition is caused by several common bacteria. You may get the infection if you come into close contact with another person who is infected. You may also come into contact with items that are contaminated with the bacteria, such as a face towel, contact lens solution, or eye makeup. What increases the risk? This condition is more likely to develop in people who:  Are exposed to other people who have the infection.  Wear contact lenses.  Have a sinus infection.  Have had a recent eye injury or surgery.  Have a weak body defense system (immune system).  Have a medical condition that causes dry eyes. What are the signs or symptoms? Symptoms of this condition  include:  Eye redness.  Tearing or watery eyes.  Itchy eyes.  Burning feeling in your eyes.  Thick, yellowish discharge from an eye. This may turn into a crust on the eyelid overnight and cause your eyelids to stick together.  Swollen eyelids.  Blurred vision. How is this diagnosed? Your health care provider can diagnose this condition based on your symptoms and medical history. Your health care provider may also take a sample of discharge from your eye to find the cause of your infection. This is rarely done. How is this treated? Treatment for this condition includes:  Antibiotic eye drops or ointment to clear the infection more quickly and prevent the spread of infection to others.  Oral antibiotic medicines to treat infections that do not respond to drops or ointments, or last longer than 10 days.  Cool, wet cloths (cool compresses) placed on the eyes.  Artificial tears applied 2-6 times a day. Follow these instructions at home: Medicines  Take or apply your antibiotic medicine as told by your health care provider. Do not stop taking or applying the antibiotic even if you start to feel better.  Take or apply over-the-counter and prescription medicines only as told by your health care provider.  Be very careful to avoid touching the edge of your eyelid with the eye drop bottle or the ointment tube when you apply medicines to the affected eye. This will keep you from spreading the infection to your other eye or to other people. Managing discomfort  Gently wipe away any drainage from your eye with a  warm, wet washcloth or a cotton ball.  Apply a cool, clean washcloth to your eye for 10-20 minutes, 3-4 times a day. General instructions  Do not wear contact lenses until the inflammation is gone and your health care provider says it is safe to wear them again. Ask your health care provider how to sterilize or replace your contact lenses before you use them again. Wear glasses  until you can resume wearing contacts.  Avoid wearing eye makeup until the inflammation is gone. Throw away any old eye cosmetics that may be contaminated.  Change or wash your pillowcase every day.  Do not share towels or washcloths. This may spread the infection.  Wash your hands often with soap and water. Use paper towels to dry your hands.  Avoid touching or rubbing your eyes.  Do not drive or use heavy machinery if your vision is blurred. Contact a health care provider if:  You have a fever.  Your symptoms do not get better after 10 days. Get help right away if:  You have a fever and your symptoms suddenly get worse.  You have severe pain when you move your eye.  You have facial pain, redness, or swelling.  You have sudden loss of vision. This information is not intended to replace advice given to you by your health care provider. Make sure you discuss any questions you have with your health care provider. Document Released: 02/27/2005 Document Revised: 07/08/2015 Document Reviewed: 12/10/2014 Elsevier Interactive Patient Education  2017 [employer].

## 2016-03-22 NOTE — Progress Notes (Signed)
Patient ID: Melanie [name], female    DOB: 04-25-1975, 41 y.o.   MRN: [id number]  PCP: Porfirio Oar, PA-C  Chief Complaint  Patient presents with  . Conjunctivitis  . Nasal Congestion  . Sinusitis    Subjective:   Presents for evaluation of conjunctivitis.  In addition, she needs refills of her regular medications, and has been advised that she needs a visit. While she has been in the office for several acute visits since then, I have not seen her since 03/2015. Pharmacy sent request for Singulair today.  Developed "head cold" last week. Saturday 03/18/2016, her RIGHT eye was irritated, and when she awoke the next day, it was crusted shut, red and swollen. Overall, it's improving but the drainage continues, and seems to be getting worse. This morning she developed similar symptoms in the LEFT eye. Both eyes feel sore. Not particularly itchy. Yellow drainage from the eyes persists throughout the day.  Respiratory symptoms are improving. Still a little congested and voice is not her usual. Headache has resolved. Sinus pressure has resolved. Hasn't had much cough.    Review of Systems As above.    Patient Active Problem List   Diagnosis Date Noted  . OSA (obstructive sleep apnea) 11/23/2015  . NASH (nonalcoholic steatohepatitis) 08/27/2015  . Abnormal LFTs 08/27/2015  . Obesity 08/27/2015  . Dependent edema 08/27/2015  . BMI 40.0-44.9, adult (HCC) 03/15/2015  . Hepatic cirrhosis (HCC) 06/08/2014  . Nonspecific abnormal results of liver function study 10/27/2013  . Depression with anxiety 06/06/2013  . Perennial allergic rhinitis 06/06/2013  . Infertility associated with anovulation 06/06/2013     Prior to Admission medications   Medication Sig Start Date End Date Taking? Authorizing Provider  buPROPion (WELLBUTRIN) 75 MG tablet Take 1 tablet (75 mg total) by mouth 2 (two) times daily. 02/23/16  Yes Stanton Kissoon, PA-C  busPIRone (BUSPAR) 10 MG tablet TAKE 1 TABLET  BY MOUTH TWICE A DAY OR AS DIRECTED. 04/07/15  Yes Elvina Sidle, MD  citalopram (CELEXA) 20 MG tablet TAKE 1 TABLET (20 MG TOTAL) BY MOUTH DAILY. 04/07/15  Yes Elvina Sidle, MD  EPINEPHrine 0.3 mg/0.3 mL IJ SOAJ injection Inject 0.3 mLs (0.3 mg total) into the muscle once. 04/29/14  Yes Wallis Bamberg, PA-C  fluticasone (FLONASE) 50 MCG/ACT nasal spray Place 2 sprays into both nostrils daily. 03/15/15  Yes Epifania Littrell, PA-C  furosemide (LASIX) 20 MG tablet Take 0.5 tablets (10 mg total) by mouth daily. 02/23/16  Yes Lanina Larranaga, PA-C  loratadine (CLARITIN) 10 MG tablet Take 10 mg by mouth daily.   Yes Historical Provider, MD  LORazepam (ATIVAN) 1 MG tablet Take 1 tablet (1 mg total) by mouth 2 (two) times daily as needed for anxiety. 02/23/16  Yes Elliyah Liszewski, PA-C  methocarbamol (ROBAXIN) 500 MG tablet Take 1 tablet (500 mg total) by mouth every 8 (eight) hours as needed. 06/23/14  Yes Gwenlyn Found Copland, MD  montelukast (SINGULAIR) 10 MG tablet TAKE ONE TABLET BY MOUTH EVERY NIGHT AT BEDTIME 02/14/16  Yes Elenora Gamma, MD  Multiple Vitamin (MULTIVITAMIN) tablet Take 1 tablet by mouth daily.   Yes Historical Provider, MD  Omega-3 Fatty Acids (FISH OIL PO) Take 1 tablet by mouth daily. Reported on 05/19/2015   Yes Historical Provider, MD  ondansetron (ZOFRAN ODT) 4 MG disintegrating tablet Take 1 tablet (4 mg total) by mouth every 8 (eight) hours as needed for nausea. 05/19/15  Yes Morrell Riddle, PA-C  polyethylene glycol  powder (GLYCOLAX/MIRALAX) powder Take 17 g by mouth daily. 05/19/15  Yes Morrell Riddle, PA-C     Allergies  Allergen Reactions  . Lidocaine Other (See Comments)    Flu like sx's  . Relafen [Nabumetone] Other (See Comments)    migraine  . Celebrex [Celecoxib] Rash  . Sulfa Antibiotics Rash       Objective:  Physical Exam  Constitutional: She is oriented to person, place, and time. She appears well-developed and well-nourished. She is active and cooperative. No  distress.  BP 132/82 (BP Location: Right Arm, Patient Position: Sitting, Cuff Size: Large)   Pulse 76   Temp 98.3 F (36.8 C) (Oral)   Resp 17   Ht 5' 9.5" (1.765 m)   Wt (!) 321 lb (145.6 kg)   LMP 03/08/2016 (Approximate)   SpO2 95%   BMI 46.72 kg/m   HENT:  Head: Normocephalic and atraumatic.  Right Ear: Hearing, tympanic membrane, external ear and ear canal normal.  Left Ear: Hearing, tympanic membrane, external ear and ear canal normal.  Nose: Mucosal edema (mild) present. No rhinorrhea.  Mouth/Throat: Uvula is midline, oropharynx is clear and moist and mucous membranes are normal.  Eyes: EOM are normal. Pupils are equal, round, and reactive to light. Right eye exhibits discharge. Right eye exhibits no chemosis, no exudate and no hordeolum. No foreign body present in the right eye. Left eye exhibits discharge. Left eye exhibits no chemosis, no exudate and no hordeolum. No foreign body present in the left eye. Right conjunctiva is injected. Right conjunctiva has no hemorrhage. Left conjunctiva is injected. Left conjunctiva has no hemorrhage. No scleral icterus.  Small amount of yellow drainage and crusting in the medial canthus both eyes  Neck: Normal range of motion. Neck supple. No thyromegaly present.  Cardiovascular: Normal rate, regular rhythm and normal heart sounds.   Pulses:      Radial pulses are 2+ on the right side, and 2+ on the left side.  Pulmonary/Chest: Effort normal and breath sounds normal.  Lymphadenopathy:       Head (right side): No tonsillar, no preauricular, no posterior auricular and no occipital adenopathy present.       Head (left side): No tonsillar, no preauricular, no posterior auricular and no occipital adenopathy present.    She has no cervical adenopathy.       Right: No supraclavicular adenopathy present.       Left: No supraclavicular adenopathy present.  Neurological: She is alert and oriented to person, place, and time. No sensory deficit.    Skin: Skin is warm, dry and intact. No rash noted. No cyanosis or erythema. Nails show no clubbing.  Psychiatric: She has a normal mood and affect. Her speech is normal and behavior is normal.           Assessment & Plan:   1. Acute bacterial conjunctivitis of both eyes Predisposed due to URI. Given progression of eye symptoms as URI symptoms resolving, treat for bacterial conjunctivitis. Quinolone product selected due to sulfa allergy. - moxifloxacin (VIGAMOX) 0.5 % ophthalmic solution; Place 1 drop into both eyes 3 (three) times daily.  Dispense: 3 mL; Refill: 0  2. Adjustment disorder with mixed anxiety and depressed mood Controlled. Continue current treatment. Refills. - buPROPion (WELLBUTRIN) 75 MG tablet; Take 1 tablet (75 mg total) by mouth 2 (two) times daily.  Dispense: 180 tablet; Refill: 3 - busPIRone (BUSPAR) 10 MG tablet; TAKE 1 TABLET BY MOUTH TWICE A DAY OR AS DIRECTED.  Dispense:  180 tablet; Refill: 3 - citalopram (CELEXA) 20 MG tablet; TAKE 1 TABLET (20 MG TOTAL) BY MOUTH DAILY.  Dispense: 90 tablet; Refill: 3 - LORazepam (ATIVAN) 1 MG tablet; Take 1 tablet (1 mg total) by mouth 2 (two) times daily as needed for anxiety.  Dispense: 20 tablet; Refill: 0   Fernande Bras, PA-C Physician Assistant-Certified Primary Care at [facility]

## 2016-03-28 ENCOUNTER — Encounter: Payer: Self-pay | Admitting: Physician Assistant

## 2016-04-06 ENCOUNTER — Encounter: Payer: Self-pay | Admitting: Physician Assistant

## 2016-04-06 ENCOUNTER — Ambulatory Visit (INDEPENDENT_AMBULATORY_CARE_PROVIDER_SITE_OTHER): Payer: BC Managed Care – PPO | Admitting: Physician Assistant

## 2016-04-06 VITALS — BP 128/60 | HR 74 | Temp 98.6°F | Resp 18 | Ht 69.5 in | Wt 321.0 lb

## 2016-04-06 DIAGNOSIS — J029 Acute pharyngitis, unspecified: Secondary | ICD-10-CM

## 2016-04-06 DIAGNOSIS — J069 Acute upper respiratory infection, unspecified: Secondary | ICD-10-CM

## 2016-04-06 LAB — POCT RAPID STREP A (OFFICE): Rapid Strep A Screen: NEGATIVE

## 2016-04-06 MED ORDER — HYDROCODONE-HOMATROPINE 5-1.5 MG/5ML PO SYRP: 5.0000 mL | ORAL_SOLUTION | Freq: Three times a day (TID) | ORAL | 0 refills | Status: DC | PRN

## 2016-04-06 NOTE — Progress Notes (Signed)
Melanie [name]  MRN: [id number] DOB: Mar 17, 1975  Subjective:  Pt presents to clinic with concerns that she has strep.  She has been sick for 3 days with sore throat and coughing and lose of voice.  Worse cough at night and after that started was when her voice went away.  Nasal congestion with yellow and green color and PND.  Sputum is the same color.    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  HENT: Positive for congestion, postnasal drip, rhinorrhea and sore throat.   Respiratory: Positive for cough. Negative for shortness of breath and wheezing.        No h/o asthma, nonsmoker  Musculoskeletal: Positive for myalgias.  Neurological: Positive for headaches.    Patient Active Problem List   Diagnosis Date Noted  . OSA (obstructive sleep apnea) 11/23/2015  . NASH (nonalcoholic steatohepatitis) 08/27/2015  . Abnormal LFTs 08/27/2015  . Obesity 08/27/2015  . Dependent edema 08/27/2015  . BMI 40.0-44.9, adult (HCC) 03/15/2015  . Hepatic cirrhosis (HCC) 06/08/2014  . Nonspecific abnormal results of liver function study 10/27/2013  . Depression with anxiety 06/06/2013  . Perennial allergic rhinitis 06/06/2013  . Infertility associated with anovulation 06/06/2013    Current Outpatient Prescriptions on File Prior to Visit  Medication Sig Dispense Refill  . buPROPion (WELLBUTRIN) 75 MG tablet Take 1 tablet (75 mg total) by mouth 2 (two) times daily. 180 tablet 3  . busPIRone (BUSPAR) 10 MG tablet TAKE 1 TABLET BY MOUTH TWICE A DAY OR AS DIRECTED. 180 tablet 3  . citalopram (CELEXA) 20 MG tablet TAKE 1 TABLET (20 MG TOTAL) BY MOUTH DAILY. 90 tablet 3  . EPINEPHrine 0.3 mg/0.3 mL IJ SOAJ injection Inject 0.3 mLs (0.3 mg total) into the muscle once. 2 Device 1  . fluticasone (FLONASE) 50 MCG/ACT nasal spray  Place 2 sprays into both nostrils daily. 48 g 3  . furosemide (LASIX) 20 MG tablet Take 0.5 tablets (10 mg total) by mouth daily. 15 tablet 0  . loratadine (CLARITIN) 10 MG tablet Take 10 mg by mouth daily.    Marland Kitchen LORazepam (ATIVAN) 1 MG tablet Take 1 tablet (1 mg total) by mouth 2 (two) times daily as needed for anxiety. 20 tablet 0  . montelukast (SINGULAIR) 10 MG tablet TAKE ONE TABLET BY MOUTH EVERY NIGHT AT BEDTIME 90 tablet 3  . Multiple Vitamin (MULTIVITAMIN) tablet Take 1 tablet by mouth daily.    . Omega-3 Fatty Acids (FISH OIL PO) Take 1 tablet by mouth daily. Reported on 05/19/2015    . polyethylene glycol powder (GLYCOLAX/MIRALAX) powder Take 17 g by mouth daily. 250 g 1  . methocarbamol (ROBAXIN) 500 MG tablet Take 1 tablet (500 mg total) by mouth every 8 (eight) hours as needed. (Patient not taking: Reported on 04/06/2016) 30 tablet 0  . ondansetron (ZOFRAN ODT) 4 MG disintegrating tablet Take 1 tablet (4 mg total) by mouth every 8 (eight) hours as needed for nausea. (Patient not taking: Reported on 04/06/2016) 20 tablet 0   No current facility-administered medications on file prior to visit.     Allergies  Allergen Reactions  . Lidocaine Other (See Comments)    Flu like sx's  . Relafen [Nabumetone] Other (See Comments)    migraine  . Celebrex [Celecoxib] Rash  . Sulfa Antibiotics Rash  Pt patients past, family and social history were reviewed and updated.   Objective:  BP 128/60   Pulse 74   Temp 98.6 F (37 C) (Oral)   Resp 18   Ht 5' 9.5" (1.765 m)   Wt (!) 321 lb (145.6 kg)   LMP 04/01/2016   SpO2 95%   BMI 46.72 kg/m   Physical Exam  Constitutional: She is oriented to person, place, and time and well-developed, well-nourished, and in no distress.  HENT:  Head: Normocephalic and atraumatic.  Right Ear: Hearing, tympanic membrane, external ear and ear canal normal.  Left Ear: Hearing, tympanic membrane, external ear and ear canal normal.  Nose: Mucosal edema  (red) present.  Mouth/Throat: Uvula is midline, oropharynx is clear and moist and mucous membranes are normal. Uvula swelling (mild no deviation) present.  Eyes: Conjunctivae are normal.  Neck: Normal range of motion.  Cardiovascular: Normal rate, regular rhythm and normal heart sounds.   No murmur heard. Pulmonary/Chest: Effort normal and breath sounds normal.  Neurological: She is alert and oriented to person, place, and time. Gait normal.  Skin: Skin is warm and dry.  Psychiatric: Mood, memory, affect and judgment normal.  Vitals reviewed.  Results for orders placed or performed in visit on 04/06/16  POCT rapid strep A  Result Value Ref Range   Rapid Strep A Screen Negative Negative    Assessment and Plan :  Sore throat - Plan: POCT rapid strep A  URI with cough and congestion - Plan: HYDROcodone-homatropine (HYCODAN) 5-1.5 MG/5ML syrup   Continue mucinex and tylenol or motrin prn.  Continue other home treatment - add humidifier.    Benny Lennert PA-C  Primary Care at [facility] 04/06/2016 2:56 PM

## 2016-04-06 NOTE — Patient Instructions (Addendum)
  Please push fluids.  Tylenol and Motrin for fever and body aches.    A humidifier can help especially when the air is dry -if you do not have a humidifier you can boil a pot of water on the stove in your home to help with the dry air.  Nasal saline spray can be helpful to keep the mucus membranes moist and thin the nasal mucus    IF you received an x-ray today, you will receive an invoice from [facility]. Please contact [facility] at [phone] with questions or concerns regarding your invoice.   IF you received labwork today, you will receive an invoice from [facility]. Please contact [facility] at [phone] with questions or concerns regarding your invoice.   Our billing staff will not be able to assist you with questions regarding bills from these companies.  You will be contacted with the lab results as soon as they are available. The fastest way to get your results is to activate your My Chart account. Instructions are located on the last page of this paperwork. If you have not heard from us regarding the results in 2 weeks, please contact this office.      

## 2016-04-18 ENCOUNTER — Other Ambulatory Visit: Payer: Self-pay | Admitting: Physician Assistant

## 2016-04-18 DIAGNOSIS — R609 Edema, unspecified: Secondary | ICD-10-CM

## 2016-05-30 ENCOUNTER — Encounter: Payer: Self-pay | Admitting: Physician Assistant

## 2016-05-30 ENCOUNTER — Ambulatory Visit: Payer: BC Managed Care – PPO | Admitting: Physician Assistant

## 2016-05-30 DIAGNOSIS — F4323 Adjustment disorder with mixed anxiety and depressed mood: Secondary | ICD-10-CM

## 2016-06-01 MED ORDER — BUPROPION HCL 75 MG PO TABS: 150.0000 mg | ORAL_TABLET | Freq: Two times a day (BID) | ORAL | 3 refills | Status: DC

## 2016-06-26 ENCOUNTER — Other Ambulatory Visit: Payer: Self-pay | Admitting: Physician Assistant

## 2016-06-26 DIAGNOSIS — R609 Edema, unspecified: Secondary | ICD-10-CM

## 2016-08-11 ENCOUNTER — Encounter: Payer: Self-pay | Admitting: Physician Assistant

## 2016-08-11 ENCOUNTER — Ambulatory Visit (INDEPENDENT_AMBULATORY_CARE_PROVIDER_SITE_OTHER): Payer: BC Managed Care – PPO

## 2016-08-11 ENCOUNTER — Ambulatory Visit (INDEPENDENT_AMBULATORY_CARE_PROVIDER_SITE_OTHER): Payer: BC Managed Care – PPO | Admitting: Physician Assistant

## 2016-08-11 VITALS — BP 141/85 | HR 66 | Temp 97.9°F | Resp 18 | Ht 68.5 in | Wt 325.0 lb

## 2016-08-11 DIAGNOSIS — J3489 Other specified disorders of nose and nasal sinuses: Secondary | ICD-10-CM

## 2016-08-11 DIAGNOSIS — S0033XA Contusion of nose, initial encounter: Secondary | ICD-10-CM

## 2016-08-11 DIAGNOSIS — S0992XA Unspecified injury of nose, initial encounter: Secondary | ICD-10-CM | POA: Diagnosis not present

## 2016-08-11 MED ORDER — HYDROCODONE-ACETAMINOPHEN 5-325 MG PO TABS: 1.0000 | ORAL_TABLET | Freq: Four times a day (QID) | ORAL | 0 refills | Status: DC | PRN

## 2016-08-11 NOTE — Progress Notes (Signed)
Melanie [name]  MRN: [id number] DOB: 02/13/1976  PCP: Porfirio Oar, PA-C  Chief Complaint  Patient presents with  . Facial Injury    hit nose yesterday and has been having pain and swelling.     Subjective:  Pt presents to clinic for nose pain after her son ran into her nose pushing it upwards.  The pain has gotten slightly better but it still really hurts.  She felt like she was swollen inside her nose last night but it is better today.  She did not have a bloody nose after the incident.  Today she has a slight headache and her glasses are hurting sitting on her nose.  Review of Systems  HENT: Negative for nosebleeds.     Patient Active Problem List   Diagnosis Date Noted  . OSA (obstructive sleep apnea) 11/23/2015  . NASH (nonalcoholic steatohepatitis) 08/27/2015  . Abnormal LFTs 08/27/2015  . Obesity 08/27/2015  . Dependent edema 08/27/2015  . BMI 40.0-44.9, adult (HCC) 03/15/2015  . Hepatic cirrhosis (HCC) 06/08/2014  . Nonspecific abnormal results of liver function study 10/27/2013  . Depression with anxiety 06/06/2013  . Perennial allergic rhinitis 06/06/2013  . Infertility associated with anovulation 06/06/2013    Current Outpatient Prescriptions on File Prior to Visit  Medication Sig Dispense Refill  . buPROPion (WELLBUTRIN) 75 MG tablet Take 2 tablets (150 mg total) by mouth 2 (two) times daily. 360 tablet 3  . busPIRone (BUSPAR) 10 MG tablet TAKE 1 TABLET BY MOUTH TWICE A DAY OR AS DIRECTED. 180 tablet 3  . citalopram (CELEXA) 20 MG tablet TAKE 1 TABLET (20 MG TOTAL) BY MOUTH DAILY. 90 tablet 3  . EPINEPHrine 0.3 mg/0.3 mL IJ SOAJ injection Inject 0.3 mLs (0.3 mg total) into the muscle once. 2 Device 1  . fluticasone (FLONASE) 50 MCG/ACT nasal spray Place 2 sprays into both nostrils daily. 48 g 3  . furosemide (LASIX) 20 MG tablet TAKE ONE-HALF TABLET BY MOUTH DAILY 15 tablet 2  . loratadine (CLARITIN) 10 MG tablet Take 10 mg by mouth daily.    Marland Kitchen LORazepam  (ATIVAN) 1 MG tablet Take 1 tablet (1 mg total) by mouth 2 (two) times daily as needed for anxiety. 20 tablet 0  . montelukast (SINGULAIR) 10 MG tablet TAKE ONE TABLET BY MOUTH EVERY NIGHT AT BEDTIME 90 tablet 3  . Multiple Vitamin (MULTIVITAMIN) tablet Take 1 tablet by mouth daily.    . Omega-3 Fatty Acids (FISH OIL PO) Take 1 tablet by mouth daily. Reported on 05/19/2015    . polyethylene glycol powder (GLYCOLAX/MIRALAX) powder Take 17 g by mouth daily. 250 g 1   No current facility-administered medications on file prior to visit.     Allergies  Allergen Reactions  . Lidocaine Other (See Comments)    Flu like sx's  . Relafen [Nabumetone] Other (See Comments)    migraine  . Celebrex [Celecoxib] Rash  . Sulfa Antibiotics Rash    Pt patients past, family and social history were reviewed and updated.   Objective:  BP (!) 141/85   Pulse 66   Temp 97.9 F (36.6 C) (Oral)   Resp 18   Ht 5' 8.5" (1.74 m)   Wt (!) 325 lb (147.4 kg)   SpO2 95%   BMI 48.69 kg/m   Physical Exam  Constitutional: She is oriented to person, place, and time and well-developed, well-nourished, and in no distress.  HENT:  Head: Normocephalic and atraumatic.  Right Ear: Hearing and external ear  normal.  Left Ear: Hearing and external ear normal.  Nose: Sinus tenderness (tender over nasal bones) present. No mucosal edema, nose lacerations, nasal deformity, septal deviation or nasal septal hematoma. No epistaxis.  Slight ecchymosis over bridge of nose more on the right side where her glasses sit  Eyes: Conjunctivae are normal.  Neck: Normal range of motion.  Pulmonary/Chest: Effort normal.  Neurological: She is alert and oriented to person, place, and time. Gait normal.  Skin: Skin is warm and dry.  Psychiatric: Mood, memory, affect and judgment normal.  Vitals reviewed.   Assessment and Plan :  Nose pain - Plan: DG Nasal Bones  Injury of nose, initial encounter - Plan: DG Nasal Bones  Contusion of  nose, initial encounter - Plan: HYDROcodone-acetaminophen (NORCO/VICODIN) 5-325 MG tablet   Ice to the nose to decrease swelling and pain - use pain medications but not tylenol in addition to the hydrocodone - it is ok to use motrin.  Recheck if problems.  Benny Lennert PA-C  Primary Care at [facility] 08/11/2016 11:47 AM

## 2016-08-11 NOTE — Patient Instructions (Signed)
     IF you received an x-ray today, you will receive an invoice from [facility]. Please contact [facility] at [phone] with questions or concerns regarding your invoice.   IF you received labwork today, you will receive an invoice from [facility]. Please contact [facility] at [phone] with questions or concerns regarding your invoice.   Our billing staff will not be able to assist you with questions regarding bills from these companies.  You will be contacted with the lab results as soon as they are available. The fastest way to get your results is to activate your My Chart account. Instructions are located on the last page of this paperwork. If you have not heard from us regarding the results in 2 weeks, please contact this office.     

## 2016-11-04 ENCOUNTER — Other Ambulatory Visit: Payer: Self-pay | Admitting: Physician Assistant

## 2016-11-04 DIAGNOSIS — R609 Edema, unspecified: Secondary | ICD-10-CM

## 2017-01-01 ENCOUNTER — Telehealth: Payer: Self-pay

## 2017-01-01 NOTE — Telephone Encounter (Signed)
LMVM for patient to CB to discuss over due health maintenance.

## 2017-02-06 ENCOUNTER — Ambulatory Visit: Payer: BC Managed Care – PPO | Admitting: Physician Assistant

## 2017-02-06 ENCOUNTER — Encounter: Payer: Self-pay | Admitting: Physician Assistant

## 2017-02-06 ENCOUNTER — Other Ambulatory Visit: Payer: Self-pay

## 2017-02-06 ENCOUNTER — Ambulatory Visit (INDEPENDENT_AMBULATORY_CARE_PROVIDER_SITE_OTHER): Payer: BC Managed Care – PPO

## 2017-02-06 VITALS — BP 124/70 | HR 84 | Temp 99.0°F | Resp 18 | Ht 68.5 in | Wt 333.6 lb

## 2017-02-06 DIAGNOSIS — J3489 Other specified disorders of nose and nasal sinuses: Secondary | ICD-10-CM

## 2017-02-06 DIAGNOSIS — R05 Cough: Secondary | ICD-10-CM

## 2017-02-06 DIAGNOSIS — J0100 Acute maxillary sinusitis, unspecified: Secondary | ICD-10-CM | POA: Diagnosis not present

## 2017-02-06 DIAGNOSIS — R059 Cough, unspecified: Secondary | ICD-10-CM

## 2017-02-06 MED ORDER — BENZONATATE 100 MG PO CAPS: 100.0000 mg | ORAL_CAPSULE | Freq: Three times a day (TID) | ORAL | 0 refills | Status: DC | PRN

## 2017-02-06 MED ORDER — DOXYCYCLINE HYCLATE 100 MG PO CAPS: 100.0000 mg | ORAL_CAPSULE | Freq: Two times a day (BID) | ORAL | 0 refills | Status: DC

## 2017-02-06 MED ORDER — HYDROCODONE-HOMATROPINE 5-1.5 MG/5ML PO SYRP: 5.0000 mL | ORAL_SOLUTION | Freq: Three times a day (TID) | ORAL | 0 refills | Status: DC | PRN

## 2017-02-06 NOTE — Patient Instructions (Addendum)
Your xray was negative, which is reassuring.  Due to the duration of your symptoms and the fact that you are not improving with symptomatic treatment, we are going to treat you empirically for underlying bacterial etiology at this time with an antibiotic.  I have prescribed you doxycycline.  This antibiotic will cover both sinus and lung bacteria.  I have also given you a prescription for Tessalon Perles and Hycodan cough syrup to use for your cough. You may use Tessalon Perles during the day.  Use cough syrup at night.  Be aware that cough syrup can make you drowsy. Return to clinic if symptoms worsen, do not improve, or as needed.  While taking Doxycycline:  -Do not drink milk or take iron supplements, multivitamins, calcium supplements, antacids, laxatives within 2 hours before or after taking doxycycline. -Avoid direct exposure to sunlight or tanning beds. Doxycycline can make you sunburn more easily. Wear protective clothing and use sunscreen (SPF 30 or higher) when you are outdoors. -Antibiotic medicines can cause diarrhea, which may be a sign of a new infection. If you have diarrhea that is watery or bloody, stop taking this medicine and seek medical care.    Sinusitis, Adult Sinusitis is soreness and inflammation of your sinuses. Sinuses are hollow spaces in the bones around your face. They are located:  Around your eyes.  In the middle of your forehead.  Behind your nose.  In your cheekbones.  Your sinuses and nasal passages are lined with a stringy fluid (mucus). Mucus normally drains out of your sinuses. When your nasal tissues get inflamed or swollen, the mucus can get trapped or blocked so air cannot flow through your sinuses. This lets bacteria, viruses, and funguses grow, and that leads to infection. Follow these instructions at home: Medicines  Take, use, or apply over-the-counter and prescription medicines only as told by your doctor. These may include nasal sprays.  If you  were prescribed an antibiotic medicine, take it as told by your doctor. Do not stop taking the antibiotic even if you start to feel better. Hydrate and Humidify  Drink enough water to keep your pee (urine) clear or pale yellow.  Use a cool mist humidifier to keep the humidity level in your home above 50%.  Breathe in steam for 10-15 minutes, 3-4 times a day or as told by your doctor. You can do this in the bathroom while a hot shower is running.  Try not to spend time in cool or dry air. Rest  Rest as much as possible.  Sleep with your head raised (elevated).  Make sure to get enough sleep each night. General instructions  Put a warm, moist washcloth on your face 3-4 times a day or as told by your doctor. This will help with discomfort.  Wash your hands often with soap and water. If there is no soap and water, use hand sanitizer.  Do not smoke. Avoid being around people who are smoking (secondhand smoke).  Keep all follow-up visits as told by your doctor. This is important. Contact a doctor if:  You have a fever.  Your symptoms get worse.  Your symptoms do not get better within 10 days. Get help right away if:  You have a very bad headache.  You cannot stop throwing up (vomiting).  You have pain or swelling around your face or eyes.  You have trouble seeing.  You feel confused.  Your neck is stiff.  You have trouble breathing. This information is not intended  to replace advice given to you by your health care provider. Make sure you discuss any questions you have with your health care provider. Document Released: 08/16/2007 Document Revised: 10/24/2015 Document Reviewed: 12/23/2014 Elsevier Interactive Patient Education  2018 [employer].     Cough, Adult A cough helps to clear your throat and lungs. A cough may last only 2-3 weeks (acute), or it may last longer than 8 weeks (chronic). Many different things can cause a cough. A cough may be a sign of an  illness or another medical condition. Follow these instructions at home:  Pay attention to any changes in your cough.  Take medicines only as told by your doctor. ? If you were prescribed an antibiotic medicine, take it as told by your doctor. Do not stop taking it even if you start to feel better. ? Talk with your doctor before you try using a cough medicine.  Drink enough fluid to keep your pee (urine) clear or pale yellow.  If the air is dry, use a cold steam vaporizer or humidifier in your home.  Stay away from things that make you cough at work or at home.  If your cough is worse at night, try using extra pillows to raise your head up higher while you sleep.  Do not smoke, and try not to be around smoke. If you need help quitting, ask your doctor.  Do not have caffeine.  Do not drink alcohol.  Rest as needed. Contact a doctor if:  You have new problems (symptoms).  You cough up yellow fluid (pus).  Your cough does not get better after 2-3 weeks, or your cough gets worse.  Medicine does not help your cough and you are not sleeping well.  You have pain that gets worse or pain that is not helped with medicine.  You have a fever.  You are losing weight and you do not know why.  You have night sweats. Get help right away if:  You cough up blood.  You have trouble breathing.  Your heartbeat is very fast. This information is not intended to replace advice given to you by your health care provider. Make sure you discuss any questions you have with your health care provider. Document Released: 11/10/2010 Document Revised: 08/05/2015 Document Reviewed: 05/06/2014 Elsevier Interactive Patient Education  2018 [employer].  IF you received an x-ray today, you will receive an invoice from [facility]. Please contact [facility] at [phone] with questions or concerns regarding your invoice.   IF you received labwork today, you will receive an  invoice from [facility]. Please contact [facility] at [phone] with questions or concerns regarding your invoice.   Our billing staff will not be able to assist you with questions regarding bills from these companies.  You will be contacted with the lab results as soon as they are available. The fastest way to get your results is to activate your My Chart account. Instructions are located on the last page of this paperwork. If you have not heard from [place] regarding the results in 2 weeks, please contact this office.

## 2017-02-06 NOTE — Progress Notes (Signed)
MRN: [id number] DOB: February 23, 1976  Subjective:   Melanie [name] is a 41 y.o. female presenting for chief complaint of Nasal Congestion (x2weeks ); Cough; Sore Throat; and Fever (off and on ) .  Reports 2 week history of illness. Has sinus pain, sinus pressure, mucus production, mix of dry/productive cough, wheezing, body aches, and intermittent fever.  Has tried OTC cough/cold medication and mucinex with no full relief.M Notes her symptoms have gotten progressively worse.  Denies  ear pain, chest tightness and chest pain, chills, nausea, vomiting, abdominal pain and diarrhea. Has not had sick contact with anyone. Has history of seasonal allergies,no history of asthma. Patient has had flu shot this season. Denies smoking. Denies any other aggravating or relieving factors, no other questions or concerns.  Melanie [name] has a current medication list which includes the following prescription(s): bupropion, buspirone, cetirizine, citalopram, epinephrine, fluticasone, furosemide, lorazepam, montelukast, multivitamin, hydrocodone-acetaminophen, loratadine, omega-3 fatty acids, and polyethylene glycol powder. Also is allergic to lidocaine; relafen [nabumetone]; celebrex [celecoxib]; and sulfa antibiotics.  Melanie [name]  has a past medical history of Allergy, Anxiety, Depression, and Membranous nephrosis. Also  has a past surgical history that includes Knee surgery (Left); wrist cyst (Left); Finger surgery (Left); Tonsillectomy and adenoidectomy (1978); Tympanostomy tube placement; and Liver biopsy (N/A, 05/15/2014).   Objective:   Vitals: BP 124/70   Pulse 84   Temp 99 F (37.2 C) (Oral)   Resp 18   Ht 5' 8.5" (1.74 m)   Wt (!) 333 lb 9.6 oz (151.3 kg)   LMP  (LMP Unknown)   SpO2 97%   BMI 49.99 kg/m   Physical Exam  Constitutional: She is oriented to person, place, and time. She appears well-developed and well-nourished. No distress.  HENT:  Head: Normocephalic and atraumatic.  Right Ear: Tympanic membrane,  external ear and ear canal normal.  Left Ear: Tympanic membrane, external ear and ear canal normal.  Nose: Mucosal edema present. Right sinus exhibits maxillary sinus tenderness. Right sinus exhibits no frontal sinus tenderness. Left sinus exhibits maxillary sinus tenderness. Left sinus exhibits no frontal sinus tenderness.  Mouth/Throat: Uvula is midline and mucous membranes are normal. Posterior oropharyngeal erythema present. Tonsils are 0 on the right. Tonsils are 0 on the left. No tonsillar exudate.  Eyes: Conjunctivae are normal.  Neck: Normal range of motion.  Cardiovascular: Normal rate, regular rhythm and normal heart sounds.  Pulmonary/Chest: Effort normal. She has no wheezes. She has no rhonchi. She has no rales.  Lung exam difficult due to large body habitus.  Neurological: She is alert and oriented to person, place, and time.  Skin: Skin is warm and dry.  Psychiatric: She has a normal mood and affect.  Vitals reviewed.   No results found for this or any previous visit (from the past 24 hour(s)).   Dg Chest 2 View  Result Date: 02/06/2017 CLINICAL DATA:  Productive cough for 2 weeks EXAM: CHEST  2 VIEW COMPARISON:  06/23/2014 FINDINGS: The heart size and mediastinal contours are within normal limits. Both lungs are clear. The visualized skeletal structures are unremarkable. IMPRESSION: No active cardiopulmonary disease. Electronically Signed   By: Elige Ko   On: 02/06/2017 14:26    Assessment and Plan :  1. Cough Plain films reassuring for no active cardiopulmonary disease.  Will provide patient with symptomatic treatment. - DG Chest 2 View; Future - benzonatate (TESSALON) 100 MG capsule; Take 1-2 capsules (100-200 mg total) by mouth 3 (three) times daily as needed for cough.  Dispense: 40 capsule; Refill: 0 - HYDROcodone-homatropine (HYCODAN) 5-1.5 MG/5ML syrup; Take 5 mLs by mouth every 8 (eight) hours as needed for cough.  Dispense: 120 mL; Refill: 0 2. Sinus  pressure 3. Acute maxillary sinusitis, recurrence not specified Due to duration of symptoms and no improvement with symptomatic tx, will treat empirically for underlying bacterial etiology at this time. Return to clinic if symptoms worsen, do not improve, or as needed. - doxycycline (VIBRAMYCIN) 100 MG capsule; Take 1 capsule (100 mg total) by mouth 2 (two) times daily.  Dispense: 20 capsule; Refill: 0  Benjiman Core, PA-C  [facility] at [facility] 02/06/2017 6:29 PM

## 2017-03-12 ENCOUNTER — Other Ambulatory Visit: Payer: Self-pay | Admitting: Physician Assistant

## 2017-03-12 DIAGNOSIS — F4323 Adjustment disorder with mixed anxiety and depressed mood: Secondary | ICD-10-CM

## 2017-03-14 NOTE — Telephone Encounter (Signed)
Would Chelle likek to see this pt prior to refilling these meds?    She hasn't had a physical since 2017 other than  for URI.    Thanks.

## 2017-03-30 ENCOUNTER — Encounter: Payer: Self-pay | Admitting: Physician Assistant

## 2017-03-30 ENCOUNTER — Ambulatory Visit (INDEPENDENT_AMBULATORY_CARE_PROVIDER_SITE_OTHER): Payer: BC Managed Care – PPO

## 2017-03-30 ENCOUNTER — Ambulatory Visit (INDEPENDENT_AMBULATORY_CARE_PROVIDER_SITE_OTHER): Payer: BC Managed Care – PPO | Admitting: Physician Assistant

## 2017-03-30 ENCOUNTER — Other Ambulatory Visit: Payer: Self-pay

## 2017-03-30 VITALS — BP 134/86 | HR 84 | Temp 98.5°F | Resp 18 | Ht 68.5 in | Wt 337.6 lb

## 2017-03-30 DIAGNOSIS — Z0000 Encounter for general adult medical examination without abnormal findings: Secondary | ICD-10-CM

## 2017-03-30 DIAGNOSIS — Z114 Encounter for screening for human immunodeficiency virus [HIV]: Secondary | ICD-10-CM | POA: Diagnosis not present

## 2017-03-30 DIAGNOSIS — K7469 Other cirrhosis of liver: Secondary | ICD-10-CM

## 2017-03-30 DIAGNOSIS — J3089 Other allergic rhinitis: Secondary | ICD-10-CM | POA: Diagnosis not present

## 2017-03-30 DIAGNOSIS — R609 Edema, unspecified: Secondary | ICD-10-CM | POA: Diagnosis not present

## 2017-03-30 DIAGNOSIS — Z124 Encounter for screening for malignant neoplasm of cervix: Secondary | ICD-10-CM

## 2017-03-30 DIAGNOSIS — M542 Cervicalgia: Secondary | ICD-10-CM

## 2017-03-30 DIAGNOSIS — Z1231 Encounter for screening mammogram for malignant neoplasm of breast: Secondary | ICD-10-CM | POA: Diagnosis not present

## 2017-03-30 DIAGNOSIS — G8929 Other chronic pain: Secondary | ICD-10-CM

## 2017-03-30 DIAGNOSIS — Z1389 Encounter for screening for other disorder: Secondary | ICD-10-CM | POA: Diagnosis not present

## 2017-03-30 DIAGNOSIS — F418 Other specified anxiety disorders: Secondary | ICD-10-CM

## 2017-03-30 DIAGNOSIS — Z6841 Body Mass Index (BMI) 40.0 and over, adult: Secondary | ICD-10-CM

## 2017-03-30 DIAGNOSIS — Z13220 Encounter for screening for lipoid disorders: Secondary | ICD-10-CM

## 2017-03-30 DIAGNOSIS — Z130 Encounter for screening for diseases of the blood and blood-forming organs and certain disorders involving the immune mechanism: Secondary | ICD-10-CM | POA: Diagnosis not present

## 2017-03-30 MED ORDER — MONTELUKAST SODIUM 10 MG PO TABS: 10.0000 mg | ORAL_TABLET | Freq: Every day | ORAL | 3 refills | Status: DC

## 2017-03-30 MED ORDER — BUSPIRONE HCL 10 MG PO TABS: ORAL_TABLET | ORAL | 3 refills | Status: DC

## 2017-03-30 MED ORDER — LORAZEPAM 1 MG PO TABS: 1.0000 mg | ORAL_TABLET | Freq: Two times a day (BID) | ORAL | 0 refills | Status: DC | PRN

## 2017-03-30 MED ORDER — BUPROPION HCL 75 MG PO TABS: 150.0000 mg | ORAL_TABLET | Freq: Two times a day (BID) | ORAL | 3 refills | Status: DC

## 2017-03-30 MED ORDER — FLUTICASONE PROPIONATE 50 MCG/ACT NA SUSP: 2.0000 | Freq: Every day | NASAL | 3 refills | Status: DC

## 2017-03-30 MED ORDER — CITALOPRAM HYDROBROMIDE 20 MG PO TABS: ORAL_TABLET | ORAL | 3 refills | Status: DC

## 2017-03-30 MED ORDER — FUROSEMIDE 20 MG PO TABS: 20.0000 mg | ORAL_TABLET | Freq: Every day | ORAL | 3 refills | Status: DC

## 2017-03-30 NOTE — Patient Instructions (Addendum)
We recommend that you schedule a mammogram for breast cancer screening. Typically, you do not need a referral to do this. Please contact a local imaging center to schedule your mammogram.  [facility] - [phone]  *ask for the Radiology Department [facility]) - [phone] or [phone]  [facility] - [phone] [facility] - [phone] [facility] - [phone]  *ask for the Radiology Department [facility] - [phone]  *ask for the Radiology Department [facility] - [phone]  *ask for the Mammography Department [facility] - [phone]    IF you received an x-ray today, you will receive an invoice from [facility]. Please contact [facility] at [phone] with questions or concerns regarding your invoice.   IF you received labwork today, you will receive an invoice from [facility]. Please contact [facility] at [phone] with questions or concerns regarding your invoice.   Our billing staff will not be able to assist you with questions regarding bills from these companies.  You will be contacted with the lab results as soon as they are available. The fastest way to get your results is to activate your My Chart account. Instructions are located on the last page of this paperwork. If you have not heard from us regarding the results in 2 weeks, please contact this office.      Preventive Care 40-64 Years, Female Preventive care refers to lifestyle choices and visits with your health care provider that can promote health and wellness. What does preventive care include?  A yearly physical exam. This is also called an annual well check.  Dental exams once or twice a year.  Routine eye exams. Ask your health care provider how often you should have your eyes checked.  Personal lifestyle choices, including: ? Daily care of your teeth and  gums. ? Regular physical activity. ? Eating a healthy diet. ? Avoiding tobacco and drug use. ? Limiting alcohol use. ? Practicing safe sex. ? Taking low-dose aspirin daily starting at age 50. ? Taking vitamin and mineral supplements as recommended by your health care provider. What happens during an annual well check? The services and screenings done by your health care provider during your annual well check will depend on your age, overall health, lifestyle risk factors, and family history of disease. Counseling Your health care provider may ask you questions about your:  Alcohol use.  Tobacco use.  Drug use.  Emotional well-being.  Home and relationship well-being.  Sexual activity.  Eating habits.  Work and work environment.  Method of birth control.  Menstrual cycle.  Pregnancy history.  Screening You may have the following tests or measurements:  Height, weight, and BMI.  Blood pressure.  Lipid and cholesterol levels. These may be checked every 5 years, or more frequently if you are over 50 years old.  Skin check.  Lung cancer screening. You may have this screening every year starting at age 55 if you have a 30-pack-year history of smoking and currently smoke or have quit within the past 15 years.  Fecal occult blood test (FOBT) of the stool. You may have this test every year starting at age 50.  Flexible sigmoidoscopy or colonoscopy. You may have a sigmoidoscopy every 5 years or a colonoscopy every 10 years starting at age 50.  Hepatitis C blood test.  Hepatitis B blood test.  Sexually transmitted disease (STD) testing.  Diabetes screening. This is done by   blood sugar (glucose) after you have not eaten for a while (fasting). You may have this done every 1-3 years.  Mammogram. This may be done every 1-2 years. Talk to your health care provider about when you should start having regular mammograms. This may depend on whether you have a  family history of breast cancer.  BRCA-related cancer screening. This may be done if you have a family history of breast, ovarian, tubal, or peritoneal cancers.  Pelvic exam and Pap test. This may be done every 3 years starting at age 21. Starting at age 30, this may be done every 5 years if you have a Pap test in combination with an HPV test.  Bone density scan. This is done to screen for osteoporosis. You may have this scan if you are at high risk for osteoporosis.  Discuss your test results, treatment options, and if necessary, the need for more tests with your health care provider. Vaccines Your health care provider may recommend certain vaccines, such as:  Influenza vaccine. This is recommended every year.  Tetanus, diphtheria, and acellular pertussis (Tdap, Td) vaccine. You may need a Td booster every 10 years.  Varicella vaccine. You may need this if you have not been vaccinated.  Zoster vaccine. You may need this after age 60.  Measles, mumps, and rubella (MMR) vaccine. You may need at least one dose of MMR if you were born in 1957 or later. You may also need a second dose.  Pneumococcal 13-valent conjugate (PCV13) vaccine. You may need this if you have certain conditions and were not previously vaccinated.  Pneumococcal polysaccharide (PPSV23) vaccine. You may need one or two doses if you smoke cigarettes or if you have certain conditions.  Meningococcal vaccine. You may need this if you have certain conditions.  Hepatitis A vaccine. You may need this if you have certain conditions or if you travel or work in places where you may be exposed to hepatitis A.  Hepatitis B vaccine. You may need this if you have certain conditions or if you travel or work in places where you may be exposed to hepatitis B.  Haemophilus influenzae type b (Hib) vaccine. You may need this if you have certain conditions.  Talk to your health care provider about which screenings and vaccines you need  and how often you need them. This information is not intended to replace advice given to you by your health care provider. Make sure you discuss any questions you have with your health care provider. Document Released: 03/26/2015 Document Revised: 11/17/2015 Document Reviewed: 12/29/2014 Elsevier Interactive Patient Education  2018 [employer]  
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
## 2017-03-31 LAB — CBC WITH DIFFERENTIAL/PLATELET
Basophils Absolute: 0 10*3/uL (ref 0.0–0.2)
Basos: 0 %
EOS (ABSOLUTE): 0.3 10*3/uL (ref 0.0–0.4)
Eos: 3 %
HEMATOCRIT: 41.2 % (ref 34.0–46.6)
HEMOGLOBIN: 14.4 g/dL (ref 11.1–15.9)
Immature Grans (Abs): 0 10*3/uL (ref 0.0–0.1)
Immature Granulocytes: 0 %
LYMPHS ABS: 2.7 10*3/uL (ref 0.7–3.1)
Lymphs: 36 %
MCH: 31.8 pg (ref 26.6–33.0)
MCHC: 35 g/dL (ref 31.5–35.7)
MCV: 91 fL (ref 79–97)
MONOS ABS: 0.4 10*3/uL (ref 0.1–0.9)
Monocytes: 5 %
NEUTROS ABS: 4.1 10*3/uL (ref 1.4–7.0)
Neutrophils: 56 %
Platelets: 217 10*3/uL (ref 150–379)
RBC: 4.53 x10E6/uL (ref 3.77–5.28)
RDW: 12.8 % (ref 12.3–15.4)
WBC: 7.5 10*3/uL (ref 3.4–10.8)

## 2017-03-31 LAB — COMPREHENSIVE METABOLIC PANEL
A/G RATIO: 1.6 (ref 1.2–2.2)
ALK PHOS: 166 IU/L — AB (ref 39–117)
ALT: 102 IU/L — ABNORMAL HIGH (ref 0–32)
AST: 99 IU/L — ABNORMAL HIGH (ref 0–40)
Albumin: 3.8 g/dL (ref 3.5–5.5)
BILIRUBIN TOTAL: 0.8 mg/dL (ref 0.0–1.2)
BUN / CREAT RATIO: 18 (ref 9–23)
BUN: 13 mg/dL (ref 6–24)
CHLORIDE: 100 mmol/L (ref 96–106)
CO2: 22 mmol/L (ref 20–29)
CREATININE: 0.72 mg/dL (ref 0.57–1.00)
Calcium: 10.3 mg/dL — ABNORMAL HIGH (ref 8.7–10.2)
GFR calc Af Amer: 119 mL/min/{1.73_m2} (ref >59)
GFR calc non Af Amer: 103 mL/min/{1.73_m2} (ref >59)
GLOBULIN, TOTAL: 2.4 g/dL (ref 1.5–4.5)
Glucose: 99 mg/dL (ref 65–99)
POTASSIUM: 4.2 mmol/L (ref 3.5–5.2)
SODIUM: 138 mmol/L (ref 134–144)
Total Protein: 6.2 g/dL (ref 6.0–8.5)

## 2017-03-31 LAB — URINALYSIS, DIPSTICK ONLY
Bilirubin, UA: NEGATIVE
Glucose, UA: NEGATIVE
Ketones, UA: NEGATIVE
Leukocytes, UA: NEGATIVE
NITRITE UA: NEGATIVE
PH UA: 6 (ref 5.0–7.5)
Specific Gravity, UA: 1.018 (ref 1.005–1.030)
UUROB: 0.2 mg/dL (ref 0.2–1.0)

## 2017-03-31 LAB — TSH: TSH: 6.96 u[IU]/mL — ABNORMAL HIGH (ref 0.450–4.500)

## 2017-03-31 LAB — LIPID PANEL
CHOLESTEROL TOTAL: 368 mg/dL — AB (ref 100–199)
Chol/HDL Ratio: 5.4 ratio — ABNORMAL HIGH (ref 0.0–4.4)
HDL: 68 mg/dL (ref >39)
LDL Calculated: 253 mg/dL — ABNORMAL HIGH (ref 0–99)
TRIGLYCERIDES: 236 mg/dL — AB (ref 0–149)
VLDL Cholesterol Cal: 47 mg/dL — ABNORMAL HIGH (ref 5–40)

## 2017-03-31 LAB — HIV ANTIBODY (ROUTINE TESTING W REFLEX): HIV SCREEN 4TH GENERATION: NONREACTIVE

## 2017-03-31 LAB — T4, FREE: Free T4: 0.92 ng/dL (ref 0.82–1.77)

## 2017-04-03 ENCOUNTER — Encounter: Payer: Self-pay | Admitting: Physician Assistant

## 2017-04-05 ENCOUNTER — Other Ambulatory Visit: Payer: Self-pay

## 2017-04-05 ENCOUNTER — Encounter: Payer: Self-pay | Admitting: Family Medicine

## 2017-04-05 ENCOUNTER — Ambulatory Visit: Payer: BC Managed Care – PPO | Admitting: Family Medicine

## 2017-04-05 VITALS — BP 122/78 | HR 82 | Temp 98.5°F | Ht 70.08 in | Wt 338.6 lb

## 2017-04-05 DIAGNOSIS — N10 Acute tubulo-interstitial nephritis: Secondary | ICD-10-CM

## 2017-04-05 DIAGNOSIS — R109 Unspecified abdominal pain: Secondary | ICD-10-CM

## 2017-04-05 LAB — POCT URINALYSIS DIP (MANUAL ENTRY)
Bilirubin, UA: NEGATIVE
Glucose, UA: NEGATIVE mg/dL
Ketones, POC UA: NEGATIVE mg/dL
Leukocytes, UA: NEGATIVE
Nitrite, UA: POSITIVE — AB
Protein Ur, POC: >=300 mg/dL — AB
Spec Grav, UA: >=1.03 — AB (ref 1.010–1.025)
Urobilinogen, UA: 0.2 E.U./dL
pH, UA: 5.5 (ref 5.0–8.0)

## 2017-04-05 MED ORDER — CEFTRIAXONE SODIUM 1 G IJ SOLR: 1.0000 g | Freq: Once | INTRAMUSCULAR | Status: DC

## 2017-04-05 MED ORDER — CYCLOBENZAPRINE HCL 10 MG PO TABS: 10.0000 mg | ORAL_TABLET | Freq: Three times a day (TID) | ORAL | 0 refills | Status: DC | PRN

## 2017-04-05 MED ORDER — CIPROFLOXACIN HCL 500 MG PO TABS: 500.0000 mg | ORAL_TABLET | Freq: Two times a day (BID) | ORAL | 0 refills | Status: DC

## 2017-04-05 NOTE — Patient Instructions (Addendum)
     IF you received an x-ray today, you will receive an invoice from [facility]. Please contact [facility] at [phone] with questions or concerns regarding your invoice.   IF you received labwork today, you will receive an invoice from [facility]. Please contact [facility] at [phone] with questions or concerns regarding your invoice.   Our billing staff will not be able to assist you with questions regarding bills from these companies.  You will be contacted with the lab results as soon as they are available. The fastest way to get your results is to activate your My Chart account. Instructions are located on the last page of this paperwork. If you have not heard from [place] regarding the results in 2 weeks, please contact this office.     Pyelonephritis, Adult Pyelonephritis is a kidney infection. The kidneys are organs that help clean your blood by moving waste out of your blood and into your pee (urine). This infection can happen quickly, or it can last for a long time. In most cases, it clears up with treatment and does not cause other problems. Follow these instructions at home: Medicines  Take over-the-counter and prescription medicines only as told by your doctor.  Take your antibiotic medicine as told by your doctor. Do not stop taking the medicine even if you start to feel better. General instructions  Drink enough fluid to keep your pee clear or pale yellow.  Avoid caffeine, tea, and carbonated drinks.  Pee (urinate) often. Avoid holding in pee for long periods of time.  Pee before and after sex.  After pooping (having a bowel movement), women should wipe from front to back. Use each tissue only once.  Keep all follow-up visits as told by your doctor. This is important. Contact a doctor if:  You do not feel better after 2 days.  Your symptoms get worse.  You have a fever. Get help right away if:  You cannot take your medicine or drink fluids  as told.  You have chills and shaking.  You throw up (vomit).  You have very bad pain in your side (flank) or back.  You feel very weak or you pass out (faint). This information is not intended to replace advice given to you by your health care provider. Make sure you discuss any questions you have with your health care provider. Document Released: 04/06/2004 Document Revised: 08/05/2015 Document Reviewed: 06/22/2014 Elsevier Interactive Patient Education  [employer].

## 2017-04-05 NOTE — Progress Notes (Signed)
1/24/20199:07 AM  Melanie [name] 1976/02/16, 42 y.o. female [id number]  Chief Complaint  Patient presents with  . Abdominal Pain    RADIATING PAIN IN STOMACH, BACK AND SHOULDERS    HPI:   Patient is a 42 y.o. female with past medical history of NASH who presents today for 2 days of left flank pain, worse with movement, wraps around towards the front. This morning starting feeling feverish, nauseous. Worried because it reminds her of the time she became septic 2/2 UTI. She denies any h/o kidney stones. She denies any dysuria or hematuria. She denies any vomiting or diarrhea. Last UTI was over a year ago. Denies any recent abx use.   Depression screen [facility] 2/9 04/05/2017 03/30/2017 02/06/2017  Decreased Interest 0 0 0  Down, Depressed, Hopeless 0 0 0  PHQ - 2 Score 0 0 0  Altered sleeping - - -  Tired, decreased energy - - -  Change in appetite - - -  Trouble concentrating - - -  Moving slowly or fidgety/restless - - -  Suicidal thoughts - - -  PHQ-9 Score - - -    Allergies  Allergen Reactions  . Lidocaine Other (See Comments)    Flu like sx's  . Relafen [Nabumetone] Other (See Comments)    migraine  . Celebrex [Celecoxib] Rash  . Sulfa Antibiotics Rash    Prior to Admission medications   Medication Sig Start Date End Date Taking? Authorizing Provider  buPROPion (WELLBUTRIN) 75 MG tablet Take 2 tablets (150 mg total) by mouth 2 (two) times daily. 03/30/17   Jeffery, Chelle, PA-C  busPIRone (BUSPAR) 10 MG tablet TAKE 1 TABLET BY MOUTH TWICE A DAY OR AS DIRECTED. 03/30/17   Porfirio Oar, PA-C  cetirizine (ZYRTEC) 10 MG tablet Take 10 mg by mouth daily.    [provider]  citalopram (CELEXA) 20 MG tablet TAKE ONE TABLET BY MOUTH DAILY 03/30/17   Leotis Shames, Chelle, PA-C  fluticasone (FLONASE) 50 MCG/ACT nasal spray Place 2 sprays into both nostrils daily. 03/30/17   Porfirio Oar, PA-C  furosemide (LASIX) 20 MG tablet Take 1 tablet (20 mg total) by mouth daily.  03/30/17   Jeffery, Chelle, PA-C  LORazepam (ATIVAN) 1 MG tablet Take 1 tablet (1 mg total) by mouth 2 (two) times daily as needed for anxiety. 03/30/17   Jeffery, Chelle, PA-C  montelukast (SINGULAIR) 10 MG tablet Take 1 tablet (10 mg total) by mouth at bedtime. 03/30/17   Porfirio Oar, PA-C  Multiple Vitamin (MULTIVITAMIN) tablet Take 1 tablet by mouth daily.    [provider]    Past Medical History:  Diagnosis Date  . Allergy   . Anxiety   . Depression   . Membranous nephrosis     Past Surgical History:  Procedure Laterality Date  . FINGER SURGERY Left     index finger  . KNEE SURGERY Left   . LIVER BIOPSY N/A 05/15/2014   Procedure: LIVER BIOPSY;  Surgeon: Louis Meckel, MD;  Location: [facility];  Service: Endoscopy;  Laterality: N/A;  ultrasound to mark the liver  . TONSILLECTOMY AND ADENOIDECTOMY  1978  . TYMPANOSTOMY TUBE PLACEMENT    . wrist cyst Left     Social History   Tobacco Use  . Smoking status: Never Smoker  . Smokeless tobacco: Never Used  Substance Use Topics  . Alcohol use: No    Alcohol/week: 0.0 oz    Family History  Problem Relation Age of Onset  . Diabetes  Mother   . Liver disease Mother        ESLD- unknown etiology  . Diabetes Maternal Grandmother   . Diabetes Paternal Grandmother   . Breast cancer Paternal Grandmother   . Stroke Maternal Grandfather     Review of Systems  Constitutional: Positive for fever and malaise/fatigue. Negative for chills.  Gastrointestinal: Positive for abdominal pain and nausea. Negative for diarrhea and vomiting.  Genitourinary: Positive for flank pain. Negative for dysuria, frequency, hematuria and urgency.     OBJECTIVE:  Blood pressure 122/78, pulse 82, temperature 98.5 F (36.9 C), temperature source Oral, height 5' 10.08" (1.78 m), weight (!) 338 lb 9.6 oz (153.6 kg), SpO2 95 %.  Physical Exam  Constitutional: She is oriented to person, place, and time and well-developed,  well-nourished, and in no distress.  But looks uncomfortable  HENT:  Head: Normocephalic and atraumatic.  Mouth/Throat: Oropharynx is clear and moist. No oropharyngeal exudate.  Eyes: EOM are normal. Pupils are equal, round, and reactive to light. No scleral icterus.  Neck: Neck supple.  Cardiovascular: Normal rate, regular rhythm and normal heart sounds. Exam reveals no gallop and no friction rub.  No murmur heard. Pulmonary/Chest: Effort normal and breath sounds normal. She has no wheezes. She has no rales.  Abdominal: There is CVA tenderness (left).  Musculoskeletal: She exhibits no edema.       Thoracic back: She exhibits tenderness. She exhibits no bony tenderness and no spasm.  Neurological: She is alert and oriented to person, place, and time. Gait normal.  Skin: Skin is warm and dry.    Results for orders placed or performed in visit on 04/05/17 (from the past 24 hour(s))  POCT urinalysis dipstick     Status: Abnormal   Collection Time: 04/05/17  8:52 AM  Result Value Ref Range   Color, UA straw (A) yellow   Clarity, UA clear clear   Glucose, UA negative negative mg/dL   Bilirubin, UA negative negative   Ketones, POC UA negative negative mg/dL   Spec Grav, UA >=1.610 (A) 1.010 - 1.025   Blood, UA moderate (A) negative   pH, UA 5.5 5.0 - 8.0   Protein Ur, POC >=300 (A) negative mg/dL   Urobilinogen, UA 0.2 0.2 or 1.0 E.U./dL   Nitrite, UA Positive (A) Negative   Leukocytes, UA Negative Negative      ASSESSMENT and PLAN  1. Acute pyelonephritis Mild, treating outpatient. Discussed supportive measures and RTC precautions.  - Urine Culture - CBC - Basic metabolic panel - cefTRIAXone (ROCEPHIN) injection 1 g  2. Acute left flank pain - POCT urinalysis dipstick  Other orders - ciprofloxacin (CIPRO) 500 MG tablet; Take 1 tablet (500 mg total) by mouth 2 (two) times daily. - cyclobenzaprine (FLEXERIL) 10 MG tablet; Take 1 tablet (10 mg total) by mouth 3 (three)  times daily as needed for muscle spasms.  Return in about 1 week (around 04/12/2017).    Myles Lipps, MD Primary Care at [facility] [address] [place], [place] [address] Ph.  [phone] Fax [phone]

## 2017-04-06 ENCOUNTER — Other Ambulatory Visit: Payer: Self-pay | Admitting: Physician Assistant

## 2017-04-06 DIAGNOSIS — F4323 Adjustment disorder with mixed anxiety and depressed mood: Secondary | ICD-10-CM

## 2017-04-06 DIAGNOSIS — F418 Other specified anxiety disorders: Secondary | ICD-10-CM

## 2017-04-06 LAB — CBC
Hematocrit: 41.2 % (ref 34.0–46.6)
Hemoglobin: 14.7 g/dL (ref 11.1–15.9)
MCH: 31.9 pg (ref 26.6–33.0)
MCHC: 35.7 g/dL (ref 31.5–35.7)
MCV: 89 fL (ref 79–97)
Platelets: 213 10*3/uL (ref 150–379)
RBC: 4.61 x10E6/uL (ref 3.77–5.28)
RDW: 13.1 % (ref 12.3–15.4)
WBC: 6.8 10*3/uL (ref 3.4–10.8)

## 2017-04-06 LAB — BASIC METABOLIC PANEL
BUN/Creatinine Ratio: 14 (ref 9–23)
BUN: 11 mg/dL (ref 6–24)
CO2: 24 mmol/L (ref 20–29)
Calcium: 10.1 mg/dL (ref 8.7–10.2)
Chloride: 99 mmol/L (ref 96–106)
Creatinine, Ser: 0.79 mg/dL (ref 0.57–1.00)
GFR calc Af Amer: 106 mL/min/{1.73_m2} (ref >59)
GFR calc non Af Amer: 92 mL/min/{1.73_m2} (ref >59)
Glucose: 160 mg/dL — ABNORMAL HIGH (ref 65–99)
Potassium: 4.4 mmol/L (ref 3.5–5.2)
Sodium: 140 mmol/L (ref 134–144)

## 2017-04-06 NOTE — Telephone Encounter (Signed)
Please advise 

## 2017-04-06 NOTE — Telephone Encounter (Signed)
Controlled substance 

## 2017-04-07 LAB — URINE CULTURE

## 2017-04-10 ENCOUNTER — Other Ambulatory Visit: Payer: Self-pay | Admitting: Physician Assistant

## 2017-04-10 DIAGNOSIS — F418 Other specified anxiety disorders: Secondary | ICD-10-CM

## 2017-04-10 DIAGNOSIS — F4323 Adjustment disorder with mixed anxiety and depressed mood: Secondary | ICD-10-CM

## 2017-04-10 NOTE — Telephone Encounter (Signed)
This was sent on 03/30/2017. Was it not received by the pharmacy?

## 2017-04-10 NOTE — Telephone Encounter (Signed)
Ativan refill Last OV: 04/05/17 Last Refill:03/30/17 Pharmacy:[facility].

## 2017-04-12 ENCOUNTER — Ambulatory Visit: Payer: BC Managed Care – PPO | Admitting: Family Medicine

## 2017-04-12 ENCOUNTER — Encounter: Payer: Self-pay | Admitting: Family Medicine

## 2017-04-12 ENCOUNTER — Other Ambulatory Visit: Payer: Self-pay

## 2017-04-12 VITALS — BP 141/84 | HR 84 | Temp 98.4°F | Resp 16 | Ht 70.08 in | Wt 337.4 lb

## 2017-04-12 DIAGNOSIS — R109 Unspecified abdominal pain: Secondary | ICD-10-CM | POA: Diagnosis not present

## 2017-04-12 DIAGNOSIS — Z131 Encounter for screening for diabetes mellitus: Secondary | ICD-10-CM

## 2017-04-12 DIAGNOSIS — N10 Acute tubulo-interstitial nephritis: Secondary | ICD-10-CM | POA: Diagnosis not present

## 2017-04-12 DIAGNOSIS — F4323 Adjustment disorder with mixed anxiety and depressed mood: Secondary | ICD-10-CM | POA: Diagnosis not present

## 2017-04-12 DIAGNOSIS — F418 Other specified anxiety disorders: Secondary | ICD-10-CM

## 2017-04-12 LAB — POCT URINALYSIS DIP (MANUAL ENTRY)
BILIRUBIN UA: NEGATIVE mg/dL
Bilirubin, UA: NEGATIVE
Glucose, UA: NEGATIVE mg/dL
Leukocytes, UA: NEGATIVE
Nitrite, UA: NEGATIVE
SPEC GRAV UA: 1.025 (ref 1.010–1.025)
UROBILINOGEN UA: 0.2 U/dL
pH, UA: 6 (ref 5.0–8.0)

## 2017-04-12 LAB — POCT GLYCOSYLATED HEMOGLOBIN (HGB A1C): HEMOGLOBIN A1C: 5.7

## 2017-04-12 MED ORDER — BUSPIRONE HCL 15 MG PO TABS: ORAL_TABLET | ORAL | 3 refills | Status: DC

## 2017-04-12 NOTE — Progress Notes (Signed)
Chief Complaint  Patient presents with  . Pyelonephritis    follow up left flank pain    HPI  Acute UTI Urine culture grew E. Coli susceptible to Cipro and Ceftriaxone Pt currently has pain on the left side greater than the right She states that her pain is much improved She no longer has nausea and stomach pains    Morbid obesity She reports that her daughter has been in the hospital since June 2018 She was admitted for psychosis and self-harming behaviors at age 56 She adopted 2 depressed boys She states that 4 boys and her husband have adhd and aspergers She works full time  She recently went back to finish her master's She states that she stress eats   Past Medical History:  Diagnosis Date  . Allergy   . Anxiety   . Depression   . Membranous nephrosis     Current Outpatient Medications  Medication Sig Dispense Refill  . buPROPion (WELLBUTRIN) 75 MG tablet Take 2 tablets (150 mg total) by mouth 2 (two) times daily. 360 tablet 3  . busPIRone (BUSPAR) 10 MG tablet TAKE 1 TABLET BY MOUTH TWICE A DAY OR AS DIRECTED. 180 tablet 3  . cetirizine (ZYRTEC) 10 MG tablet Take 10 mg by mouth daily.    . citalopram (CELEXA) 20 MG tablet TAKE ONE TABLET BY MOUTH DAILY 90 tablet 3  . cyclobenzaprine (FLEXERIL) 10 MG tablet Take 1 tablet (10 mg total) by mouth 3 (three) times daily as needed for muscle spasms. 30 tablet 0  . fluticasone (FLONASE) 50 MCG/ACT nasal spray Place 2 sprays into both nostrils daily. 48 g 3  . furosemide (LASIX) 20 MG tablet Take 1 tablet (20 mg total) by mouth daily. 90 tablet 3  . LORazepam (ATIVAN) 1 MG tablet Take 1 tablet (1 mg total) by mouth 2 (two) times daily as needed for anxiety. 20 tablet 0  . montelukast (SINGULAIR) 10 MG tablet Take 1 tablet (10 mg total) by mouth at bedtime. 90 tablet 3  . Multiple Vitamin (MULTIVITAMIN) tablet Take 1 tablet by mouth daily.     Current Facility-Administered Medications  Medication Dose Route Frequency  Provider Last Rate Last Dose  . cefTRIAXone (ROCEPHIN) injection 1 g  1 g Intramuscular Once Rutherford Guys, MD        Allergies:  Allergies  Allergen Reactions  . Lidocaine Other (See Comments)    Flu like sx's  . Relafen [Nabumetone] Other (See Comments)    migraine  . Celebrex [Celecoxib] Rash  . Sulfa Antibiotics Rash    Past Surgical History:  Procedure Laterality Date  . FINGER SURGERY Left     index finger  . KNEE SURGERY Left   . LIVER BIOPSY N/A 05/15/2014   Procedure: LIVER BIOPSY;  Surgeon: Inda Castle, MD;  Location: [facility];  Service: Endoscopy;  Laterality: N/A;  ultrasound to mark the liver  . TONSILLECTOMY AND ADENOIDECTOMY  1978  . TYMPANOSTOMY TUBE PLACEMENT    . wrist cyst Left     Social History   Socioeconomic History  . Marital status: Married    Spouse name: Thayer Jew  . Number of children: 5  . Years of education: in [employer] program  . Highest education level: Bachelor's degree (e.g., BA, AB, BS)  Social Needs  . Financial resource strain: None  . Food insecurity - worry: None  . Food insecurity - inability: None  . Transportation needs - medical: None  . Transportation needs - non-medical:  None  Occupational History  . Occupation: Product manager: [employer]    Comment: [employer]  Tobacco Use  . Smoking status: Never Smoker  . Smokeless tobacco: Never Used  Substance and Sexual Activity  . Alcohol use: No    Alcohol/week: 0.0 oz  . Drug use: No  . Sexual activity: Yes    Partners: Male    Birth control/protection: Post-menopausal  Other Topics Concern  . None  Social History Narrative   Lives with her husband and their 5 adpoted children.    Adopted 2 sets of siblings, all with psychiatric diagnoses (ADHD, Asperger's, ODD, PTSD, depression, bipolar disorder).   Her husband has Asperger's, and works in disability services.   Occasionally drinks coke or pepsi     Family History  Problem Relation Age of Onset  .  Diabetes Mother   . Liver disease Mother        ESLD- unknown etiology  . Diabetes Maternal Grandmother   . Diabetes Paternal Grandmother   . Breast cancer Paternal Grandmother   . Stroke Maternal Grandfather      ROS Review of Systems See HPI Constitution: No fevers or chills No malaise No diaphoresis Skin: No rash or itching Eyes: no blurry vision, no double vision Neuro: no dizziness or headaches all others reviewed and negative   Objective: Vitals:   04/12/17 1401  BP: (!) 141/84  Pulse: 84  Resp: 16  Temp: 98.4 F (36.9 C)  TempSrc: Oral  SpO2: 97%  Weight: (!) 337 lb 6.4 oz (153 kg)  Height: 5' 10.08" (1.78 m)  Body mass index is 48.3 kg/m.  Wt Readings from Last 3 Encounters:  04/12/17 (!) 337 lb 6.4 oz (153 kg)  04/05/17 (!) 338 lb 9.6 oz (153.6 kg)  03/30/17 (!) 337 lb 9.6 oz (153.1 kg)     Physical Exam Physical Exam  Constitutional: She is oriented to person, place, and time. She appears well-developed and well-nourished.  HENT:  Head: Normocephalic and atraumatic.  Eyes: Conjunctivae and EOM are normal.  Cardiovascular: Normal rate, regular rhythm and normal heart sounds.   Pulmonary/Chest: Effort normal and breath sounds normal. No respiratory distress. She has no wheezes.  Abdominal: Normal appearance and bowel sounds are normal. There is no tenderness. There is no CVA tenderness.  Neurological: She is alert and oriented to person, place, and time.   Lab Results  Component Value Date   CREATININE 0.79 04/05/2017   Component     Latest Ref Rng & Units 04/05/2017 04/05/2017 04/12/2017 04/12/2017         8:52 AM  8:52 AM  2:40 PM  2:40 PM  Color, UA     yellow straw (A)  yellow   Clarity, UA     clear clear  clear   Glucose     negative mg/dL negative  negative   Bilirubin, UA     negative negative negative negative negative  Specific Gravity, UA     1.010 - 1.025 >=1.030 (A)  1.025   RBC, UA     negative moderate (A)  moderate (A)     pH, UA     5.0 - 8.0 5.5  6.0   Protein, UA     negative mg/dL >=300 (A)  >=300 (A)   Urobilinogen, UA     0.2 or 1.0 E.U./dL 0.2  0.2   Nitrite, UA     Negative Positive (A)  Negative   Leukocytes, UA  Negative Negative  Negative     Assessment and Plan Noele was seen today for pyelonephritis.  Diagnoses and all orders for this visit:  Acute pyelonephritis Acute left flank pain -     POCT urinalysis dipstick Appropriately treated with cipro and rocephin Continue hydration Residual flank pain should resolve  Screening for diabetes mellitus -     POCT glycosylated hemoglobin (Hb A1C)  Morbid obesity ([place])- discussed stress eating Discussed options to help manage the weight Pt currently on wellbutrin and buspar Increase buspar 11m bid   Follow up with PCP in 6 weeks  for weight check and to discuss if wellbutrin can be increase  A total of 25 minutes were spent face-to-face with the patient during this encounter and over half of that time was spent on counseling and coordination of care.  [place]

## 2017-04-12 NOTE — Patient Instructions (Signed)
     IF you received an x-ray today, you will receive an invoice from [facility]. Please contact [facility] at [phone] with questions or concerns regarding your invoice.   IF you received labwork today, you will receive an invoice from [facility]. Please contact [facility] at [phone] with questions or concerns regarding your invoice.   Our billing staff will not be able to assist you with questions regarding bills from these companies.  You will be contacted with the lab results as soon as they are available. The fastest way to get your results is to activate your My Chart account. Instructions are located on the last page of this paperwork. If you have not heard from us regarding the results in 2 weeks, please contact this office.     

## 2017-04-15 NOTE — Assessment & Plan Note (Signed)
Stable. Controlled.

## 2017-04-15 NOTE — Assessment & Plan Note (Signed)
Update labs.  

## 2017-04-15 NOTE — Assessment & Plan Note (Signed)
Stable, despite new stressors. Continue bupropion, buspirone, citalopram, PRN lorazepam

## 2017-04-15 NOTE — Assessment & Plan Note (Signed)
Obesity likely contributes. Await labs.

## 2017-04-15 NOTE — Assessment & Plan Note (Signed)
Healthier eating. Increase exercise. Consider MWM-she's not ready yet.

## 2017-04-16 ENCOUNTER — Encounter: Payer: Self-pay | Admitting: Physician Assistant

## 2017-04-16 DIAGNOSIS — E785 Hyperlipidemia, unspecified: Secondary | ICD-10-CM | POA: Insufficient documentation

## 2017-04-16 DIAGNOSIS — Z6841 Body Mass Index (BMI) 40.0 and over, adult: Secondary | ICD-10-CM

## 2017-04-18 ENCOUNTER — Encounter: Payer: Self-pay | Admitting: Physician Assistant

## 2017-04-18 ENCOUNTER — Other Ambulatory Visit: Payer: Self-pay | Admitting: Physician Assistant

## 2017-04-18 DIAGNOSIS — K7400 Hepatic fibrosis, unspecified: Secondary | ICD-10-CM

## 2017-04-18 DIAGNOSIS — K7581 Nonalcoholic steatohepatitis (NASH): Secondary | ICD-10-CM

## 2017-04-18 DIAGNOSIS — E785 Hyperlipidemia, unspecified: Secondary | ICD-10-CM

## 2017-04-18 MED ORDER — ATORVASTATIN CALCIUM 20 MG PO TABS: 20.0000 mg | ORAL_TABLET | Freq: Every day | ORAL | 3 refills | Status: DC

## 2017-05-02 ENCOUNTER — Other Ambulatory Visit (INDEPENDENT_AMBULATORY_CARE_PROVIDER_SITE_OTHER): Payer: BC Managed Care – PPO

## 2017-05-02 ENCOUNTER — Encounter: Payer: Self-pay | Admitting: Physician Assistant

## 2017-05-02 ENCOUNTER — Ambulatory Visit: Payer: BC Managed Care – PPO | Admitting: Physician Assistant

## 2017-05-02 VITALS — BP 110/76 | HR 80 | Ht 68.5 in | Wt 338.4 lb

## 2017-05-02 DIAGNOSIS — K7581 Nonalcoholic steatohepatitis (NASH): Secondary | ICD-10-CM

## 2017-05-02 LAB — PROTIME-INR
INR: 1 ratio (ref 0.8–1.0)
PROTHROMBIN TIME: 11.1 s (ref 9.6–13.1)

## 2017-05-02 NOTE — Progress Notes (Signed)
Subjective:    Patient ID: Melanie [name], female    DOB: 1975-11-12, 42 y.o.   MRN: [id number]  HPI Melanie [name] is a pleasant 42 year old white female, known previously to Dr. Arlyce Dice who was last seen in our office in March 2016 who comes back today for follow-up of NASH. Marland Kitchen Patient has history of morbid obesity, hyperlipidemia, anxiety/depression and sleep apnea. She had undergone liver workup in 2016 with upper abdominal ultrasound  With Elastography  showing coarse hepatic echotexture worrisome for possible early cirrhosis, there were hyper echoic changes of the liver consistent with steatosis, her mid of your fibrosis score was F3/F4 with high risk of fibrosis. Subsequent liver biopsy was done showing moderate fibrosis. Viral serologies, autoimmune and inherited forms of liver disease/markers were all negative. Patient relates today that her mother is status post recent liver transplant due to complications of Elita Boone cirrhosis. She also has a sister with fatty liver who just underwent a bariatric surgery in [place] last month. Patient says she feels fine today has no GI complaints. She says she's had a very stressful year, has ongoing issues with several of her adopted children who have [place] and psychiatric diagnoses. She says she is a stress eater. Her weight is up 40 pounds since her last visit here 3 years ago. Most recent labs January 2019 showed T bili of 0.8 alkaline phosphatase 166, AST 99 and ALT of 102, CBC normal, lipid panels cholesterol 368 triglycerides 236 HDL 68 and LDL 253. She has just started on Lipitor within the past month. She has been referred to the weight management clinic through [facility] and has an appointment there in April. Patient says that bariatric surgery has been mentioned to her in the past but up to this point she had not wanted to go through a major surgery. She does not drink any alcohol.  Review of Systems Pertinent positive and negative review of systems  were noted in the above HPI section.  All other review of systems was otherwise negative.  Outpatient Encounter Medications as of 05/02/2017  Medication Sig  . atorvastatin (LIPITOR) 20 MG tablet Take 1 tablet (20 mg total) by mouth daily.  Marland Kitchen buPROPion (WELLBUTRIN) 75 MG tablet Take 2 tablets (150 mg total) by mouth 2 (two) times daily.  . busPIRone (BUSPAR) 15 MG tablet TAKE 1 TABLET BY MOUTH TWICE A DAY OR AS DIRECTED.  Marland Kitchen cetirizine (ZYRTEC) 10 MG tablet Take 10 mg by mouth daily.  . citalopram (CELEXA) 20 MG tablet TAKE ONE TABLET BY MOUTH DAILY  . cyclobenzaprine (FLEXERIL) 10 MG tablet Take 1 tablet (10 mg total) by mouth 3 (three) times daily as needed for muscle spasms.  . fluticasone (FLONASE) 50 MCG/ACT nasal spray Place 2 sprays into both nostrils daily.  . furosemide (LASIX) 20 MG tablet Take 1 tablet (20 mg total) by mouth daily.  Marland Kitchen LORazepam (ATIVAN) 1 MG tablet Take 1 tablet (1 mg total) by mouth 2 (two) times daily as needed for anxiety.  . montelukast (SINGULAIR) 10 MG tablet Take 1 tablet (10 mg total) by mouth at bedtime.  . Multiple Vitamin (MULTIVITAMIN) tablet Take 1 tablet by mouth daily.  . Omega-3 Fatty Acids (FISH OIL PO) Take by mouth daily.   Facility-Administered Encounter Medications as of 05/02/2017  Medication  . cefTRIAXone (ROCEPHIN) injection 1 g   Allergies  Allergen Reactions  . Lidocaine Other (See Comments)    Flu like sx's  . Relafen [Nabumetone] Other (See Comments)  migraine  . Celebrex [Celecoxib] Rash  . Sulfa Antibiotics Rash   Patient Active Problem List   Diagnosis Date Noted  . Hyperlipidemia 04/16/2017  . OSA (obstructive sleep apnea) 11/23/2015  . NASH (nonalcoholic steatohepatitis) 08/27/2015  . Dependent edema 08/27/2015  . BMI 40.0-44.9, adult (HCC) 03/15/2015  . Hepatic fibrosis 06/08/2014  . Depression with anxiety 06/06/2013  . Perennial allergic rhinitis 06/06/2013  . Infertility associated with anovulation 06/06/2013    Social History   Socioeconomic History  . Marital status: Married    Spouse name: Zollie Beckers  . Number of children: 5  . Years of education: in [employer] program  . Highest education level: Bachelor's degree (e.g., BA, AB, BS)  Social Needs  . Financial resource strain: Not on file  . Food insecurity - worry: Not on file  . Food insecurity - inability: Not on file  . Transportation needs - medical: Not on file  . Transportation needs - non-medical: Not on file  Occupational History  . Occupation: Magazine features editor: [employer]    Comment: [employer]  Tobacco Use  . Smoking status: Never Smoker  . Smokeless tobacco: Never Used  Substance and Sexual Activity  . Alcohol use: No    Alcohol/week: 0.0 oz  . Drug use: No  . Sexual activity: Yes    Partners: Male    Birth control/protection: Post-menopausal  Other Topics Concern  . Not on file  Social History Narrative   Lives with her husband and their 5 adpoted children.    Adopted 2 sets of siblings, all with psychiatric diagnoses (ADHD, Asperger's, ODD, PTSD, depression, bipolar disorder).   Her husband has Asperger's, and works in disability services.   Occasionally drinks coke or pepsi     Ms. Wyeth's family history includes Breast cancer in her paternal grandmother; Diabetes in her maternal grandmother, mother, and paternal grandmother; Liver disease in her mother; Stroke in her maternal grandfather.      Objective:    Vitals:   05/02/17 0932  BP: 110/76  Pulse: 80    Physical Exam  ; well-developed white female in no acute distress, pleasant blood pressure 110/76 pulse 80, height 5 foot 8, weight 338, BMI 50.7. HEENT ;nontraumatic normocephalic EOMI PERRLA sclera anicteric, Cardiovascular; regular rate and rhythm with S1-S2 no murmur or gallop, Pulmonary ;clear bilaterally, Abdomen; obese soft nontender nondistended bowel sounds are active there is no palpable mass or hepatosplenomegaly, Rectal ;exam not done,  Ext; no clubbing cyanosis or edema skin warm and dry, Neuropsych ;mood and affect appropriate       Assessment & Plan:   #54 42 year old white female with NASH, biopsy-proven in 2016 with liver biopsy showing moderate fibrosis. Patient comes in today for routine follow-up. She has just recently been started on Lipitor for hyperlipidemia. Her weight is her main issue, up 40 pounds from last office visit here and is morbidly obese with a BMI currently at 50.7 She has a sister who just underwent bariatric surgery, and patient's mother just had liver transplant within the past year for [facility] cirrhosis. I think patient would be a good candidate for gastric bypass, and weight loss would be her most beneficial risk modification to help prevent progression of her liver disease. #2 sleep apnea   #3 depression/anxiety  Plan; check ProTime/INR Schedule for upper abdominal ultrasound with Elastography. Patient is strongly encouraged to keep her appointment at the weight management clinic. We also discussed referral to surgery for bariatric consultation. She will  think about this. Advised to start some sort of weight management now, she has done weight watchers in the past. Stressed the importance of normalizing her lipids, and continued avoidance of alcohol . Patient will be established with Dr. Lavon Paganini. Follow-up will be determined based on findings of ultrasound/Elastography We will need to see her at least annually.   Reniya Mcclees S Josi Roediger PA-C 05/02/2017   Cc: Porfirio Oar, PA-C

## 2017-05-02 NOTE — Patient Instructions (Signed)
If you are age 44 or older, your body mass index should be between 23-30. Your Body mass index is 50.7 kg/m. If this is out of the aforementioned range listed, please consider follow up with your Primary Care Provider.  If you are age 21 or younger, your body mass index should be between 19-25. Your Body mass index is 50.7 kg/m. If this is out of the aformentioned range listed, please consider follow up with your Primary Care Provider.   You have been scheduled for an abdominal ultrasound at [facility] (1st floor of hospital) on 05-08-2017 at 1130. Please arrive 15 minutes prior to your appointment for registration. Make certain not to have anything to eat or drink 6 hours prior to your appointment. Should you need to reschedule your appointment, please contact radiology at [phone]. This test typically takes about 30 minutes to perform.  Please keep the appointment with weight management clinic.  Get back in weight watchers goal of 59 lbs/5%  Thank you for choosing [facility]

## 2017-05-03 NOTE — Progress Notes (Signed)
Reviewed and agree with documentation and assessment and plan. K. Veena Tolbert Matheson , MD   

## 2017-05-08 ENCOUNTER — Ambulatory Visit (HOSPITAL_COMMUNITY): Admission: RE | Admit: 2017-05-08 | Payer: BC Managed Care – PPO | Source: Ambulatory Visit

## 2017-05-10 ENCOUNTER — Encounter: Payer: Self-pay | Admitting: Physician Assistant

## 2017-05-10 ENCOUNTER — Ambulatory Visit: Payer: BC Managed Care – PPO | Admitting: Urgent Care

## 2017-05-10 ENCOUNTER — Ambulatory Visit: Payer: BC Managed Care – PPO | Admitting: Physician Assistant

## 2017-05-10 ENCOUNTER — Ambulatory Visit (INDEPENDENT_AMBULATORY_CARE_PROVIDER_SITE_OTHER): Payer: BC Managed Care – PPO

## 2017-05-10 VITALS — BP 140/85 | HR 73 | Temp 97.6°F | Resp 16 | Ht 68.5 in | Wt 341.0 lb

## 2017-05-10 DIAGNOSIS — S93602A Unspecified sprain of left foot, initial encounter: Secondary | ICD-10-CM | POA: Diagnosis not present

## 2017-05-10 DIAGNOSIS — M25572 Pain in left ankle and joints of left foot: Secondary | ICD-10-CM

## 2017-05-10 DIAGNOSIS — M79672 Pain in left foot: Secondary | ICD-10-CM

## 2017-05-10 NOTE — Progress Notes (Signed)
PRIMARY CARE AT [facility] [address] [place] [address] [phone]  Date:  05/10/2017   Name:  Melanie [name]   DOB:  07-20-1975   MRN:  [id number]  PCP:  Porfirio Oar, PA-C    History of Present Illness:  Melanie [name] is a 42 y.o. female patient who presents to PCP with  Chief Complaint  Patient presents with  . Foot Injury    left/ x today  . Ankle Pain    left/x today, pt rolled her ankle this morning     Left foot pain after she rolled it this morning.  Swelling has occurred with difficulty ambulating.  She has never fractured this foot.  No numbness or tingling.    Patient Active Problem List   Diagnosis Date Noted  . Hyperlipidemia 04/16/2017  . OSA (obstructive sleep apnea) 11/23/2015  . NASH (nonalcoholic steatohepatitis) 08/27/2015  . Dependent edema 08/27/2015  . BMI 40.0-44.9, adult (HCC) 03/15/2015  . Hepatic fibrosis 06/08/2014  . Depression with anxiety 06/06/2013  . Perennial allergic rhinitis 06/06/2013  . Infertility associated with anovulation 06/06/2013    Past Medical History:  Diagnosis Date  . Allergy   . Anxiety   . Depression   . Membranous nephrosis     Past Surgical History:  Procedure Laterality Date  . FINGER SURGERY Left     index finger  . KNEE SURGERY Left   . LIVER BIOPSY N/A 05/15/2014   Procedure: LIVER BIOPSY;  Surgeon: Louis Meckel, MD;  Location: [facility];  Service: Endoscopy;  Laterality: N/A;  ultrasound to mark the liver  . TONSILLECTOMY AND ADENOIDECTOMY  1978  . TYMPANOSTOMY TUBE PLACEMENT    . wrist cyst Left     Social History   Tobacco Use  . Smoking status: Never Smoker  . Smokeless tobacco: Never Used  Substance Use Topics  . Alcohol use: No    Alcohol/week: 0.0 oz  . Drug use: No    Family History  Problem Relation Age of Onset  . Diabetes Mother   . Liver disease Mother        ESLD- unknown etiology  . Diabetes Maternal Grandmother   . Diabetes Paternal Grandmother   . Breast cancer  Paternal Grandmother   . Stroke Maternal Grandfather     Allergies  Allergen Reactions  . Lidocaine Other (See Comments)    Flu like sx's  . Relafen [Nabumetone] Other (See Comments)    migraine  . Celebrex [Celecoxib] Rash  . Sulfa Antibiotics Rash    Medication list has been reviewed and updated.  Current Outpatient Medications on File Prior to Visit  Medication Sig Dispense Refill  . atorvastatin (LIPITOR) 20 MG tablet Take 1 tablet (20 mg total) by mouth daily. 90 tablet 3  . buPROPion (WELLBUTRIN) 75 MG tablet Take 2 tablets (150 mg total) by mouth 2 (two) times daily. 360 tablet 3  . busPIRone (BUSPAR) 15 MG tablet TAKE 1 TABLET BY MOUTH TWICE A DAY OR AS DIRECTED. 60 tablet 3  . cetirizine (ZYRTEC) 10 MG tablet Take 10 mg by mouth daily.    . citalopram (CELEXA) 20 MG tablet TAKE ONE TABLET BY MOUTH DAILY 90 tablet 3  . cyclobenzaprine (FLEXERIL) 10 MG tablet Take 1 tablet (10 mg total) by mouth 3 (three) times daily as needed for muscle spasms. 30 tablet 0  . fluticasone (FLONASE) 50 MCG/ACT nasal spray Place 2 sprays into both nostrils daily. 48 g 3  . furosemide (LASIX)  20 MG tablet Take 1 tablet (20 mg total) by mouth daily. 90 tablet 3  . LORazepam (ATIVAN) 1 MG tablet Take 1 tablet (1 mg total) by mouth 2 (two) times daily as needed for anxiety. 20 tablet 0  . montelukast (SINGULAIR) 10 MG tablet Take 1 tablet (10 mg total) by mouth at bedtime. 90 tablet 3  . Multiple Vitamin (MULTIVITAMIN) tablet Take 1 tablet by mouth daily.    . Omega-3 Fatty Acids (FISH OIL PO) Take by mouth daily.     Current Facility-Administered Medications on File Prior to Visit  Medication Dose Route Frequency Provider Last Rate Last Dose  . cefTRIAXone (ROCEPHIN) injection 1 g  1 g Intramuscular Once [place], Irma M, MD        ROS ROS otherwise unremarkable unless listed above.  Physical Examination: BP 140/85   Pulse 73   Temp 97.6 F (36.4 C) (Oral)   Resp 16   Ht 5' 8.5" (1.74  m)   Wt (!) 341 lb (154.7 kg)   SpO2 95%   BMI 51.09 kg/m  Ideal Body Weight: Weight in (lb) to have BMI = 25: 166.5  Physical Exam  Constitutional: She is oriented to person, place, and time. She appears well-developed and well-nourished. No distress.  HENT:  Head: Normocephalic and atraumatic.  Right Ear: External ear normal.  Left Ear: External ear normal.  Eyes: Conjunctivae and EOM are normal. Pupils are equal, round, and reactive to light.  Cardiovascular: Normal rate.  Pulmonary/Chest: Effort normal. No respiratory distress.  Musculoskeletal:       Left ankle: She exhibits decreased range of motion (all planes). Tenderness (at the fibula, and tender just inferior to the medial malleolus). Lateral malleolus and medial malleolus tenderness found. Achilles tendon normal. Achilles tendon exhibits normal Thompson's test results.  Neurological: She is alert and oriented to person, place, and time.  Skin: She is not diaphoretic.  Psychiatric: She has a normal mood and affect. Her behavior is normal.     Dg Ankle Complete Left  Result Date: 05/10/2017 CLINICAL DATA:  Left ankle injury from fall. EXAM: LEFT ANKLE COMPLETE - 3+ VIEW COMPARISON:  03/16/2014. FINDINGS: Diffuse severe soft tissue swelling. No acute bony abnormality. No evidence of fracture or dislocation. IMPRESSION: Diffuse soft tissue swelling.  No acute bony abnormality. Electronically Signed   By: Maisie Fus  Register   On: 05/10/2017 12:53   Dg Foot Complete Left  Result Date: 05/10/2017 CLINICAL DATA:  Tenderness lateral malleolus. Tenderness diffusely about the ankle. Limited range of motion. Swelling. EXAM: LEFT FOOT - COMPLETE 3+ VIEW COMPARISON:  Left ankle series 03/16/2014. FINDINGS: Diffuse soft tissue swelling. No acute bony or joint abnormality. No evidence of fracture. No evidence of dislocation. IMPRESSION: Diffuse soft tissue swelling. No acute bony or joint abnormality. No evidence of fracture. Electronically  Signed   By: Maisie Fus  Register   On: 05/10/2017 12:52      Assessment and Plan: Melanie [name] is a 42 y.o. female who is here today for cc of  Chief Complaint  Patient presents with  . Foot Injury    left/ x today  . Ankle Pain    left/x today, pt rolled her ankle this morning  --advised rice.  Bandaging placed.  Stretches and limited ibuprofne use but more ice advised.  Rtc in 1 week if no improvement.  Foot sprain, left, initial encounter  Left foot pain - Plan: DG Foot Complete Left, DG Ankle Complete Left  Acute left ankle  pain - Plan: DG Foot Complete Left, DG Ankle Complete Left  Trena Platt, PA-C [facility] 3/12/20191:20 PM

## 2017-05-10 NOTE — Patient Instructions (Addendum)
Please ice the knee three times per day for 15 mintues.  You can use ibuprofen with food (and water).   Rest the foot as much as possible at this time for the next 3 days Ice  Compress the foot with the ace bandage Elevate the foot as much as possible Let's do this for the next 2-3 days.  Then start doing the regimen of stretching below and ice directly afterward.   Ankle Sprain, Phase I Rehab Ask your health care provider which exercises are safe for you. Do exercises exactly as told by your health care provider and adjust them as directed. It is normal to feel mild stretching, pulling, tightness, or discomfort as you do these exercises, but you should stop right away if you feel sudden pain or your pain gets worse.Do not begin these exercises until told by your health care provider. Stretching and range of motion exercises These exercises warm up your muscles and joints and improve the movement and flexibility of your lower leg and ankle. These exercises also help to relieve pain and stiffness. Exercise A: Gastroc and soleus stretch  1. Sit on the floor with your left / right leg extended. 2. Loop a belt or towel around the ball of your left / right foot. The ball of your foot is on the walking surface, right under your toes. 3. Keep your left / right ankle and foot relaxed and keep your knee straight while you use the belt or towel to pull your foot toward you. You should feel a gentle stretch behind your calf or knee. 4. Hold this position for __________ seconds, then release to the starting position. Repeat the exercise with your knee bent. You can put a pillow or a rolled bath towel under your knee to support it. You should feel a stretch deep in your calf or at your Achilles tendon. Repeat each stretch __________ times. Complete these stretches __________ times a day. Exercise B: Ankle alphabet  1. Sit with your left / right leg supported at the lower leg. ? Do not rest your foot on  anything. ? Make sure your foot has room to move freely. 2. Think of your left / right foot as a paintbrush, and move your foot to trace each letter of the alphabet in the air. Keep your hip and knee still while you trace. Make the letters as large as you can without feeling discomfort. 3. Trace every letter from A to Z. Repeat __________ times. Complete this exercise __________ times a day. Strengthening exercises These exercises build strength and endurance in your ankle and lower leg. Endurance is the ability to use your muscles for a long time, even after they get tired. Exercise C: Dorsiflexors  1. Secure a rubber exercise band or tube to an object, such as a table leg, that will stay still when the band is pulled. Secure the other end around your left / right foot. 2. Sit on the floor facing the object, with your left / right leg extended. The band or tube should be slightly tense when your foot is relaxed. 3. Slowly bring your foot toward you, pulling the band tighter. 4. Hold this position for __________ seconds. 5. Slowly return your foot to the starting position. Repeat __________ times. Complete this exercise __________ times a day. Exercise D: Plantar flexors  1. Sit on the floor with your left / right leg extended. 2. Loop a rubber exercise tube or band around the ball of your left /  right foot. The ball of your foot is on the walking surface, right under your toes. ? Hold the ends of the band or tube in your hands. ? The band or tube should be slightly tense when your foot is relaxed. 3. Slowly point your foot and toes downward, pushing them away from you. 4. Hold this position for __________ seconds. 5. Slowly return your foot to the starting position. Repeat __________ times. Complete this exercise __________ times a day. Exercise E: Evertors 1. Sit on the floor with your legs straight out in front of you. 2. Loop a rubber exercise band or tube around the ball of your left  / right foot. The ball of your foot is on the walking surface, right under your toes. ? Hold the ends of the band in your hands, or secure the band to a stable object. ? The band or tube should be slightly tense when your foot is relaxed. 3. Slowly push your foot outward, away from your other leg. 4. Hold this position for __________ seconds. 5. Slowly return your foot to the starting position. Repeat __________ times. Complete this exercise __________ times a day. This information is not intended to replace advice given to you by your health care provider. Make sure you discuss any questions you have with your health care provider. Document Released: 09/28/2004 Document Revised: 11/04/2015 Document Reviewed: 01/11/2015 Elsevier Interactive Patient Education  2018 [employer].

## 2017-05-11 ENCOUNTER — Ambulatory Visit: Payer: BC Managed Care – PPO | Admitting: Physician Assistant

## 2017-05-16 ENCOUNTER — Ambulatory Visit (HOSPITAL_COMMUNITY)
Admission: RE | Admit: 2017-05-16 | Discharge: 2017-05-16 | Disposition: A | Discharge status: Home / Self Care | Payer: BC Managed Care – PPO | Source: Ambulatory Visit | Attending: Physician Assistant | Admitting: Physician Assistant

## 2017-05-16 DIAGNOSIS — K7581 Nonalcoholic steatohepatitis (NASH): Secondary | ICD-10-CM | POA: Insufficient documentation

## 2017-05-17 ENCOUNTER — Encounter (INDEPENDENT_AMBULATORY_CARE_PROVIDER_SITE_OTHER): Payer: BC Managed Care – PPO

## 2017-05-23 ENCOUNTER — Ambulatory Visit (INDEPENDENT_AMBULATORY_CARE_PROVIDER_SITE_OTHER): Payer: BC Managed Care – PPO | Admitting: Family Medicine

## 2017-05-23 ENCOUNTER — Encounter (INDEPENDENT_AMBULATORY_CARE_PROVIDER_SITE_OTHER): Payer: Self-pay | Admitting: Family Medicine

## 2017-05-23 VITALS — BP 128/80 | HR 63 | Temp 97.7°F | Ht 69.0 in | Wt 338.0 lb

## 2017-05-23 DIAGNOSIS — R0602 Shortness of breath: Secondary | ICD-10-CM | POA: Diagnosis not present

## 2017-05-23 DIAGNOSIS — K7400 Hepatic fibrosis, unspecified: Secondary | ICD-10-CM

## 2017-05-23 DIAGNOSIS — R5383 Other fatigue: Secondary | ICD-10-CM | POA: Diagnosis not present

## 2017-05-23 DIAGNOSIS — E66813 Obesity, class 3: Secondary | ICD-10-CM

## 2017-05-23 DIAGNOSIS — Z1331 Encounter for screening for depression: Secondary | ICD-10-CM | POA: Diagnosis not present

## 2017-05-23 DIAGNOSIS — Z0289 Encounter for other administrative examinations: Secondary | ICD-10-CM

## 2017-05-23 DIAGNOSIS — R7303 Prediabetes: Secondary | ICD-10-CM | POA: Diagnosis not present

## 2017-05-23 DIAGNOSIS — Z6841 Body Mass Index (BMI) 40.0 and over, adult: Secondary | ICD-10-CM

## 2017-05-23 DIAGNOSIS — Z9189 Other specified personal risk factors, not elsewhere classified: Secondary | ICD-10-CM

## 2017-05-23 NOTE — Progress Notes (Signed)
Office: [phone]  /  Fax: [phone]   Dear Harrison Mons PA-C,   Thank you for referring Melanie [name] to our clinic. The following note includes my evaluation and treatment recommendations.  HPI:   Chief Complaint: OBESITY    Melanie [name] has been referred by Harrison Mons, PA-C for consultation regarding her obesity and obesity related comorbidities.    Melanie [name] (MR# [id number]) is a 42 y.o. female who presents on 05/23/2017 for obesity evaluation and treatment. Current BMI is Body mass index is 49.91 kg/m.Marland Kitchen Melanie [name] has been struggling with her weight for many years and has been unsuccessful in either losing weight, maintaining weight loss, or reaching her healthy weight goal.     Melanie [name] has a lot of family stressors involving children.     Melanie [name] attended our information session and states she is currently in the action stage of change and ready to dedicate time achieving and maintaining a healthier weight. Melanie [name] is interested in becoming our patient and working on intensive lifestyle modifications including (but not limited to) diet, exercise and weight loss.    Melanie [name] states her family eats meals together she thinks her family will eat healthier with  her her desired weight loss is 168-178 lbs she has been heavy most of  her life she started gaining weight in middle school her heaviest weight ever was 338 lbs she has significant food cravings issues  she snacks frequently in the evenings she skips meals frequently she is frequently drinking liquids with calories she frequently makes poor food choices she frequently eats larger portions than normal  she struggles with emotional eating    Fatigue Melanie [name] feels her energy is lower than it should be. This has worsened with weight gain and has not worsened recently. Melanie [name] admits to daytime somnolence and  admits to waking up still tired. Patient is at risk for obstructive sleep apnea. Patent has a history of symptoms of daytime  fatigue. Patient generally gets 5 hours of sleep per night, and states they generally have nightime awakenings. Snoring is present. Apneic episodes are not present. Epworth Sleepiness Score is 8.  Dyspnea on exertion Aleni notes increasing shortness of breath with exercising and seems to be worsening over time with weight gain. She notes getting out of breath sooner with activity than she used to. This has not gotten worse recently. EKG within normal limits. Effa denies orthopnea.  Pre-Diabetes Melanie [name] has a diagnosis of pre-diabetes based on her elevated Hgb A1c and was informed this puts her at greater risk of developing diabetes. She is not taking metformin currently and continues to work on diet and exercise to decrease risk of diabetes. She notes cravings and polyphagia of sweets and denies polyuria  At risk for diabetes Melanie [name] is at higher than average risk for developing diabetes due to her obesity and pre-diabetes. She currently denies polyuria or polydipsia.  Liver Fibrosis Melanie [name] had recent ultrasound of abdomen and has seen GI 2 times in the past.  Depression Screen Melanie [name]'s Food and Mood (modified PHQ-9) score was  Depression screen PHQ 2/9 05/23/2017  Decreased Interest 1  Down, Depressed, Hopeless 1  PHQ - 2 Score 2  Altered sleeping 2  Tired, decreased energy 2  Change in appetite 2  Feeling bad or failure about yourself  1  Trouble concentrating 0  Moving slowly or fidgety/restless 0  Suicidal thoughts 0  PHQ-9 Score 9  Difficult doing work/chores Not difficult at all    ALLERGIES: Allergies  Allergen Reactions  . Lidocaine Other (See Comments)    Flu like sx's  . Relafen [Nabumetone] Other (See Comments)    migraine  . Celebrex [Celecoxib] Rash  . Sulfa Antibiotics Rash    MEDICATIONS: Current Outpatient Medications on File Prior to Visit  Medication Sig Dispense Refill  . atorvastatin (LIPITOR) 20 MG tablet Take 1 tablet (20 mg total) by mouth daily. 90 tablet 3    . buPROPion (WELLBUTRIN) 75 MG tablet Take 2 tablets (150 mg total) by mouth 2 (two) times daily. 360 tablet 3  . busPIRone (BUSPAR) 15 MG tablet TAKE 1 TABLET BY MOUTH TWICE A DAY OR AS DIRECTED. 60 tablet 3  . cetirizine (ZYRTEC) 10 MG tablet Take 10 mg by mouth daily.    . citalopram (CELEXA) 20 MG tablet TAKE ONE TABLET BY MOUTH DAILY 90 tablet 3  . cyclobenzaprine (FLEXERIL) 10 MG tablet Take 1 tablet (10 mg total) by mouth 3 (three) times daily as needed for muscle spasms. 30 tablet 0  . fluticasone (FLONASE) 50 MCG/ACT nasal spray Place 2 sprays into both nostrils daily. 48 g 3  . furosemide (LASIX) 20 MG tablet Take 1 tablet (20 mg total) by mouth daily. 90 tablet 3  . LORazepam (ATIVAN) 1 MG tablet Take 1 tablet (1 mg total) by mouth 2 (two) times daily as needed for anxiety. 20 tablet 0  . montelukast (SINGULAIR) 10 MG tablet Take 1 tablet (10 mg total) by mouth at bedtime. 90 tablet 3  . Multiple Vitamin (MULTIVITAMIN) tablet Take 1 tablet by mouth daily.    . Omega-3 Fatty Acids (FISH OIL PO) Take by mouth daily.     Current Facility-Administered Medications on File Prior to Visit  Medication Dose Route Frequency Provider Last Rate Last Dose  . cefTRIAXone (ROCEPHIN) injection 1 g  1 g Intramuscular Once Rutherford Guys, MD        PAST MEDICAL HISTORY: Past Medical History:  Diagnosis Date  . Allergy   . Anemia   . Anxiety   . Back pain   . Constipation   . Depression   . Fatty liver   . GERD (gastroesophageal reflux disease)   . Hyperlipidemia   . Infertility, female   . Joint pain   . Kidney problem   . Membranous nephrosis   . Multiple food allergies   . Muscle pain   . Numbness and tingling in both hands   . Pre-diabetes   . Shortness of breath   . Sleep apnea   . Stress   . Swelling of both lower extremities     PAST SURGICAL HISTORY: Past Surgical History:  Procedure Laterality Date  . FINGER SURGERY Left     index finger  . KNEE SURGERY Left   .  LIVER BIOPSY N/A 05/15/2014   Procedure: LIVER BIOPSY;  Surgeon: Inda Castle, MD;  Location: [facility];  Service: Endoscopy;  Laterality: N/A;  ultrasound to mark the liver  . TONSILLECTOMY AND ADENOIDECTOMY  1978  . TYMPANOSTOMY TUBE PLACEMENT    . wrist cyst Left   . WRIST SURGERY     left - cyst removed    SOCIAL HISTORY: Social History   Tobacco Use  . Smoking status: Never Smoker  . Smokeless tobacco: Never Used  Substance Use Topics  . Alcohol use: No    Alcohol/week: 0.0 oz  . Drug use: No    FAMILY HISTORY: Family History  Problem Relation Age of Onset  . Diabetes  Mother   . Liver disease Mother        ESLD- unknown etiology  . Obesity Mother   . Diabetes Maternal Grandmother   . Diabetes Paternal Grandmother   . Breast cancer Paternal Grandmother   . Stroke Maternal Grandfather   . Hyperlipidemia Father     ROS: Review of Systems  Constitutional: Positive for malaise/fatigue. Negative for weight loss.       + Trouble sleeping  HENT: Positive for tinnitus.        + Hay fever  Eyes:       + Wear glasses or contacts  Respiratory: Positive for shortness of breath (with exertion).   Cardiovascular: Negative for orthopnea.  Genitourinary: Negative for frequency.  Musculoskeletal: Positive for back pain.       + Neck stiffness + Muscle or joint pain + Muscle stiffness  Skin:       + Dryness  Endo/Heme/Allergies: Negative for polydipsia.       Positive polyphagia  Psychiatric/Behavioral: Positive for depression. Negative for suicidal ideas.       + Stress    PHYSICAL EXAM: Blood pressure 128/80, pulse 63, temperature 97.7 F (36.5 C), temperature source Oral, height 5' 9"  (1.753 m), weight (!) 338 lb (153.3 kg), SpO2 96 %. Body mass index is 49.91 kg/m. Physical Exam  Constitutional: She is oriented to person, place, and time. She appears well-developed and well-nourished.  HENT:  Head: Normocephalic and atraumatic.  Nose: Nose normal.  Eyes:  EOM are normal. No scleral icterus.  Neck: Normal range of motion. Neck supple. No thyromegaly present.  Cardiovascular: Normal rate and regular rhythm.  Murmur (1/6 systolic murmur heard best at mitral area) heard. Pulmonary/Chest: Effort normal. No respiratory distress.  Abdominal: Soft. There is no tenderness.  + Obesity  Musculoskeletal:  Range of Motion normal in all 4 extremities 2+ edema noted in bilateral lower extremities  Neurological: She is alert and oriented to person, place, and time. Coordination normal.  Skin: Skin is warm and dry.  + Skin tags  Psychiatric: She has a normal mood and affect. Her behavior is normal.  Vitals reviewed.   RECENT LABS AND TESTS: BMET    Component Value Date/Time   NA 140 04/05/2017 0947   K 4.4 04/05/2017 0947   CL 99 04/05/2017 0947   CO2 24 04/05/2017 0947   GLUCOSE 160 (H) 04/05/2017 0947   GLUCOSE 118 (H) 09/08/2015 1514   BUN 11 04/05/2017 0947   CREATININE 0.79 04/05/2017 0947   CREATININE 0.85 09/08/2015 1514   CALCIUM 10.1 04/05/2017 0947   GFRNONAA 92 04/05/2017 0947   GFRNONAA 85 09/08/2015 1514   GFRAA 106 04/05/2017 0947   GFRAA >89 09/08/2015 1514   Lab Results  Component Value Date   HGBA1C 5.7 04/12/2017   No results found for: INSULIN CBC    Component Value Date/Time   WBC 6.8 04/05/2017 0947   WBC 8.3 08/27/2015 1633   RBC 4.61 04/05/2017 0947   RBC 4.27 08/27/2015 1633   HGB 14.7 04/05/2017 0947   HCT 41.2 04/05/2017 0947   PLT 213 04/05/2017 0947   MCV 89 04/05/2017 0947   MCH 31.9 04/05/2017 0947   MCH 31.1 08/27/2015 1633   MCHC 35.7 04/05/2017 0947   MCHC 34.3 08/27/2015 1633   RDW 13.1 04/05/2017 0947   LYMPHSABS 2.7 03/30/2017 1509   MONOABS 0.4 10/27/2013 1604   EOSABS 0.3 03/30/2017 1509   BASOSABS 0.0 03/30/2017 1509   Iron/TIBC/Ferritin/ %Sat  Component Value Date/Time   IRON 95 06/27/2013 1645   TIBC 371 06/27/2013 1645   FERRITIN 401 (H) 06/27/2013 1645   IRONPCTSAT 26  06/27/2013 1645   Lipid Panel     Component Value Date/Time   CHOL 368 (H) 03/30/2017 1509   TRIG 236 (H) 03/30/2017 1509   HDL 68 03/30/2017 1509   CHOLHDL 5.4 (H) 03/30/2017 1509   CHOLHDL 5.0 06/06/2013 1654   VLDL 27 06/06/2013 1654   LDLCALC 253 (H) 03/30/2017 1509   Hepatic Function Panel     Component Value Date/Time   PROT 6.2 03/30/2017 1509   ALBUMIN 3.8 03/30/2017 1509   AST 99 (H) 03/30/2017 1509   ALT 102 (H) 03/30/2017 1509   ALKPHOS 166 (H) 03/30/2017 1509   BILITOT 0.8 03/30/2017 1509   BILIDIR 0.2 04/07/2015 1349   IBILI 1.0 04/07/2015 1349      Component Value Date/Time   TSH 6.960 (H) 03/30/2017 1509   TSH 3.31 08/27/2015 1633   TSH 4.529 (H) 06/27/2013 1645    ECG  shows NSR with a rate of 64 BPM INDIRECT CALORIMETER done today shows a VO2 of 250 and a REE of 1741.  Her calculated basal metabolic rate is 2876 thus her basal metabolic rate is worse than expected.    ASSESSMENT AND PLAN: Other fatigue - Plan: EKG 12-Lead, Vitamin B12, CBC With Differential, Folate, Lipid Panel With LDL/HDL Ratio, T3, T4, free, TSH, VITAMIN D 25 Hydroxy (Vit-D Deficiency, Fractures), Comprehensive metabolic panel  Shortness of breath on exertion - Plan: CBC With Differential, ECHOCARDIOGRAM COMPLETE  Prediabetes - Plan: Hemoglobin A1c, Insulin, random  Liver fibrosis  Depression screening  At risk for diabetes mellitus  Class 3 severe obesity with serious comorbidity and body mass index (BMI) of 50.0 to 59.9 in adult, unspecified obesity type (HCC)  PLAN:  Fatigue Melanie [name] was informed that her fatigue may be related to obesity, depression or many other causes. Labs will be ordered, and in the meanwhile Melanie [name] has agreed to work on diet, exercise and weight loss to help with fatigue. Proper sleep hygiene was discussed including the need for 7-8 hours of quality sleep each night. A sleep study was not ordered based on symptoms and Epworth score.  Dyspnea on  exertion Melanie [name]'s shortness of breath appears to be obesity related and exercise induced. She has agreed to work on weight loss and gradually increase exercise to treat her exercise induced shortness of breath. If Melanie [name] follows our instructions and loses weight without improvement of her shortness of breath, we will plan to refer to pulmonology. We will monitor this condition regularly. Melanie [name] agrees to this plan. We have sent a referral for an echocardiogram to [facility] at [facility] auth# [id number].  Pre-Diabetes Melanie [name] will continue to work on weight loss, exercise, and decreasing simple carbohydrates in her diet to help decrease the risk of diabetes. We dicussed metformin including benefits and risks. She was informed that eating too many simple carbohydrates or too many calories at one sitting increases the likelihood of GI side effects. Melanie [name] declined metformin for now and a prescription was not written today. We will check labs and Melanie [name] agrees to follow up with our clinic in 2 weeks as directed to monitor her progress.  Diabetes risk counselling Melanie [name] was given extended (15 minutes) diabetes prevention counseling today. She is 42 y.o. female and has risk factors for diabetes including obesity and pre-diabetes. We discussed intensive lifestyle modifications today with an emphasis on weight loss  as well as increasing exercise and decreasing simple carbohydrates in her diet.  Liver Fibrosis We will check labs and Jeiry agrees to follow up with our clinic in 2 weeks.  Depression Screen Annahi had a mildly positive depression screening. Depression is commonly associated with obesity and often results in emotional eating behaviors. We will monitor this closely and work on CBT to help improve the non-hunger eating patterns. Referral to Psychology may be required if no improvement is seen as she continues in our clinic.  Obesity Jera is currently in the action stage of change and her goal is to continue  with weight loss efforts. I recommend Tressie begin the structured treatment plan as follows:  She has agreed to follow the Category 3 plan Alexy has been instructed to eventually work up to a goal of 150 minutes of combined cardio and strengthening exercise per week for weight loss and overall health benefits. We discussed the following Behavioral Modification Strategies today: increasing lean protein intake, work on meal planning and easy cooking plans, keeping healthy foods in the home, and travel eating strategies   She was informed of the importance of frequent follow up visits to maximize her success with intensive lifestyle modifications for her multiple health conditions. She was informed we would discuss her lab results at her next visit unless there is a critical issue that needs to be addressed sooner. Kelcey agreed to keep her next visit at the agreed upon time to discuss these results.    OBESITY BEHAVIORAL INTERVENTION VISIT  Today's visit was # 1 out of 22.  Starting weight: 338 lbs Starting date: 05/23/17 Today's weight : 338 lbs  Today's date: 05/23/2017 Total lbs lost to date: 0 (Patients must lose 7 lbs in the first 6 months to continue with counseling)   ASK: We discussed the diagnosis of obesity with [name] today and Crystalann agreed to give [place] permission to discuss obesity behavioral modification therapy today.  ASSESS: Katurah has the diagnosis of obesity and her BMI today is 49.89 Jeanita is in the action stage of change   ADVISE: Rene was educated on the multiple health risks of obesity as well as the benefit of weight loss to improve her health. She was advised of the need for long term treatment and the importance of lifestyle modifications.  AGREE: Multiple dietary modification options and treatment options were discussed and  Khristi agreed to the above obesity treatment plan.   I, Trixie Dredge, am acting as transcriptionist for Ilene Qua, MD   I have  reviewed the above documentation for accuracy and completeness, and I agree with the above. - Ilene Qua, MD

## 2017-05-24 LAB — COMPREHENSIVE METABOLIC PANEL
ALBUMIN: 3.7 g/dL (ref 3.5–5.5)
ALT: 86 IU/L — ABNORMAL HIGH (ref 0–32)
AST: 85 IU/L — ABNORMAL HIGH (ref 0–40)
Albumin/Globulin Ratio: 1.9 (ref 1.2–2.2)
Alkaline Phosphatase: 194 IU/L — ABNORMAL HIGH (ref 39–117)
BUN / CREAT RATIO: 15 (ref 9–23)
BUN: 10 mg/dL (ref 6–24)
Bilirubin Total: 0.9 mg/dL (ref 0.0–1.2)
CO2: 25 mmol/L (ref 20–29)
CREATININE: 0.67 mg/dL (ref 0.57–1.00)
Calcium: 9.2 mg/dL (ref 8.7–10.2)
Chloride: 101 mmol/L (ref 96–106)
GFR, EST AFRICAN AMERICAN: 125 mL/min/{1.73_m2} (ref >59)
GFR, EST NON AFRICAN AMERICAN: 108 mL/min/{1.73_m2} (ref >59)
GLOBULIN, TOTAL: 2 g/dL (ref 1.5–4.5)
GLUCOSE: 108 mg/dL — AB (ref 65–99)
Potassium: 4.1 mmol/L (ref 3.5–5.2)
SODIUM: 139 mmol/L (ref 134–144)
TOTAL PROTEIN: 5.7 g/dL — AB (ref 6.0–8.5)

## 2017-05-24 LAB — VITAMIN D 25 HYDROXY (VIT D DEFICIENCY, FRACTURES): Vit D, 25-Hydroxy: 28.6 ng/mL — ABNORMAL LOW (ref 30.0–100.0)

## 2017-05-24 LAB — CBC WITH DIFFERENTIAL
BASOS ABS: 0 10*3/uL (ref 0.0–0.2)
BASOS: 1 %
EOS (ABSOLUTE): 0.3 10*3/uL (ref 0.0–0.4)
Eos: 4 %
HEMOGLOBIN: 14.7 g/dL (ref 11.1–15.9)
Hematocrit: 43.3 % (ref 34.0–46.6)
Immature Grans (Abs): 0 10*3/uL (ref 0.0–0.1)
Immature Granulocytes: 0 %
LYMPHS: 39 %
Lymphocytes Absolute: 2.8 10*3/uL (ref 0.7–3.1)
MCH: 31.5 pg (ref 26.6–33.0)
MCHC: 33.9 g/dL (ref 31.5–35.7)
MCV: 93 fL (ref 79–97)
MONOCYTES: 5 %
Monocytes Absolute: 0.3 10*3/uL (ref 0.1–0.9)
NEUTROS ABS: 3.7 10*3/uL (ref 1.4–7.0)
Neutrophils: 51 %
RBC: 4.66 x10E6/uL (ref 3.77–5.28)
RDW: 13.6 % (ref 12.3–15.4)
WBC: 7.2 10*3/uL (ref 3.4–10.8)

## 2017-05-24 LAB — HEMOGLOBIN A1C
Est. average glucose Bld gHb Est-mCnc: 120 mg/dL
Hgb A1c MFr Bld: 5.8 % — ABNORMAL HIGH (ref 4.8–5.6)

## 2017-05-24 LAB — LIPID PANEL WITH LDL/HDL RATIO
CHOLESTEROL TOTAL: 245 mg/dL — AB (ref 100–199)
HDL: 77 mg/dL (ref >39)
LDL CALC: 143 mg/dL — AB (ref 0–99)
LDl/HDL Ratio: 1.9 ratio (ref 0.0–3.2)
Triglycerides: 127 mg/dL (ref 0–149)
VLDL Cholesterol Cal: 25 mg/dL (ref 5–40)

## 2017-05-24 LAB — T3: T3, Total: 132 ng/dL (ref 71–180)

## 2017-05-24 LAB — T4, FREE: FREE T4: 1.08 ng/dL (ref 0.82–1.77)

## 2017-05-24 LAB — FOLATE: FOLATE: 18.1 ng/mL (ref >3.0)

## 2017-05-24 LAB — INSULIN, RANDOM: INSULIN: 30.4 u[IU]/mL — AB (ref 2.6–24.9)

## 2017-05-24 LAB — VITAMIN B12: Vitamin B-12: 792 pg/mL (ref 232–1245)

## 2017-05-24 LAB — TSH: TSH: 7.87 u[IU]/mL — ABNORMAL HIGH (ref 0.450–4.500)

## 2017-05-25 ENCOUNTER — Ambulatory Visit (HOSPITAL_COMMUNITY): Payer: BC Managed Care – PPO | Attending: Cardiovascular Disease

## 2017-05-25 ENCOUNTER — Other Ambulatory Visit: Payer: Self-pay

## 2017-05-25 DIAGNOSIS — R0602 Shortness of breath: Secondary | ICD-10-CM | POA: Diagnosis present

## 2017-05-30 ENCOUNTER — Ambulatory Visit: Payer: BC Managed Care – PPO | Admitting: Physician Assistant

## 2017-05-31 ENCOUNTER — Encounter (INDEPENDENT_AMBULATORY_CARE_PROVIDER_SITE_OTHER): Payer: Self-pay | Admitting: Family Medicine

## 2017-06-06 ENCOUNTER — Ambulatory Visit (INDEPENDENT_AMBULATORY_CARE_PROVIDER_SITE_OTHER): Payer: BC Managed Care – PPO | Admitting: Family Medicine

## 2017-06-06 VITALS — BP 109/69 | HR 71 | Temp 98.1°F | Ht 69.0 in | Wt 327.0 lb

## 2017-06-06 DIAGNOSIS — E559 Vitamin D deficiency, unspecified: Secondary | ICD-10-CM | POA: Diagnosis not present

## 2017-06-06 DIAGNOSIS — R7303 Prediabetes: Secondary | ICD-10-CM

## 2017-06-06 DIAGNOSIS — E038 Other specified hypothyroidism: Secondary | ICD-10-CM

## 2017-06-06 DIAGNOSIS — E039 Hypothyroidism, unspecified: Secondary | ICD-10-CM

## 2017-06-06 DIAGNOSIS — E7849 Other hyperlipidemia: Secondary | ICD-10-CM

## 2017-06-06 DIAGNOSIS — Z9189 Other specified personal risk factors, not elsewhere classified: Secondary | ICD-10-CM | POA: Diagnosis not present

## 2017-06-06 DIAGNOSIS — Z6841 Body Mass Index (BMI) 40.0 and over, adult: Secondary | ICD-10-CM

## 2017-06-06 MED ORDER — METFORMIN HCL 500 MG PO TABS
500.0000 mg | ORAL_TABLET | Freq: Every day | ORAL | 0 refills | Status: DC
Start: 2017-06-06 — End: 2017-06-21

## 2017-06-06 MED ORDER — LEVOTHYROXINE SODIUM 25 MCG PO TABS: 25.0000 ug | ORAL_TABLET | Freq: Every day | ORAL | 0 refills | Status: DC

## 2017-06-06 MED ORDER — VITAMIN D (ERGOCALCIFEROL) 1.25 MG (50000 UNIT) PO CAPS: 50000.0000 [IU] | ORAL_CAPSULE | ORAL | 0 refills | Status: DC

## 2017-06-06 NOTE — Progress Notes (Signed)
Office: [phone]  /  Fax: [phone]   HPI:   Chief Complaint: OBESITY Melanie [name] is here to discuss her progress with her obesity treatment plan. She is on the Category 3 plan and is following her eating plan approximately 90 to 95 % of the time. She states she is exercising 0 minutes 0 times per week. [name] enjoyed the meal plan. She is looking to discuss ways of moving some of the food around. Her weight is (!) 327 lb (148.3 kg) today and has had a weight loss of 11 pounds over a period of 2 weeks since her last visit. She has lost 11 lbs since starting treatment with [place].  Vitamin D deficiency [name] has a diagnosis of vitamin D deficiency. [name] has a vitamin D level of 28.6 and she is not currently taking OTC vit D and denies nausea, vomiting or muscle weakness.  Hypothyroidism subclinical [name] has a diagnosis of hypothyroidism with a TSH of 7.870 (previously 6.96). T3 and T4 are within normal limits. [place] She is not on levothyroxine. She denies hot or cold intolerance or palpitations, but does admit to ongoing fatigue.  Pre-Diabetes [name] has a diagnosis of pre-diabetes based on her elevated Hgb A1c of 5.8 and insulin of 30.4 and she was informed this puts her at greater risk of developing diabetes. She is not taking metformin currently and continues to work on diet and exercise to decrease risk of diabetes. She denies hyperphagia, polyuria or polydipsia.  Hyperlipidemia [name] has hyperlipidemia and was previously started on lipitor by gastroenterology PA. Her LDL is elevated at 143. [name]  has been trying to improve her cholesterol levels with intensive lifestyle modification including a low saturated fat diet, exercise and weight loss. She denies any chest pain, claudication or myalgias.  ALLERGIES: Allergies  Allergen Reactions  . Lidocaine Other (See Comments)    Flu like sx's  . Relafen [Nabumetone] Other (See Comments)    migraine  . Celebrex [Celecoxib] Rash  . Sulfa  Antibiotics Rash    MEDICATIONS: Current Outpatient Medications on File Prior to Visit  Medication Sig Dispense Refill  . atorvastatin (LIPITOR) 20 MG tablet Take 1 tablet (20 mg total) by mouth daily. 90 tablet 3  . buPROPion (WELLBUTRIN) 75 MG tablet Take 2 tablets (150 mg total) by mouth 2 (two) times daily. 360 tablet 3  . busPIRone (BUSPAR) 15 MG tablet TAKE 1 TABLET BY MOUTH TWICE A DAY OR AS DIRECTED. 60 tablet 3  . cetirizine (ZYRTEC) 10 MG tablet Take 10 mg by mouth daily.    . citalopram (CELEXA) 20 MG tablet TAKE ONE TABLET BY MOUTH DAILY 90 tablet 3  . cyclobenzaprine (FLEXERIL) 10 MG tablet Take 1 tablet (10 mg total) by mouth 3 (three) times daily as needed for muscle spasms. 30 tablet 0  . fluticasone (FLONASE) 50 MCG/ACT nasal spray Place 2 sprays into both nostrils daily. 48 g 3  . furosemide (LASIX) 20 MG tablet Take 1 tablet (20 mg total) by mouth daily. 90 tablet 3  . LORazepam (ATIVAN) 1 MG tablet Take 1 tablet (1 mg total) by mouth 2 (two) times daily as needed for anxiety. 20 tablet 0  . montelukast (SINGULAIR) 10 MG tablet Take 1 tablet (10 mg total) by mouth at bedtime. 90 tablet 3  . Multiple Vitamin (MULTIVITAMIN) tablet Take 1 tablet by mouth daily.    . Omega-3 Fatty Acids (FISH OIL PO) Take by mouth daily.     Current Facility-Administered Medications on File  Prior to Visit  Medication Dose Route Frequency Provider Last Rate Last Dose  . cefTRIAXone (ROCEPHIN) injection 1 g  1 g Intramuscular Once Rutherford Guys, MD        PAST MEDICAL HISTORY: Past Medical History:  Diagnosis Date  . Allergy   . Anemia   . Anxiety   . Back pain   . Constipation   . Depression   . Fatty liver   . GERD (gastroesophageal reflux disease)   . Hyperlipidemia   . Infertility, female   . Joint pain   . Kidney problem   . Membranous nephrosis   . Multiple food allergies   . Muscle pain   . Numbness and tingling in both hands   . Pre-diabetes   . Shortness of breath    . Sleep apnea   . Stress   . Swelling of both lower extremities     PAST SURGICAL HISTORY: Past Surgical History:  Procedure Laterality Date  . FINGER SURGERY Left     index finger  . KNEE SURGERY Left   . LIVER BIOPSY N/A 05/15/2014   Procedure: LIVER BIOPSY;  Surgeon: Inda Castle, MD;  Location: [facility];  Service: Endoscopy;  Laterality: N/A;  ultrasound to mark the liver  . TONSILLECTOMY AND ADENOIDECTOMY  1978  . TYMPANOSTOMY TUBE PLACEMENT    . wrist cyst Left   . WRIST SURGERY     left - cyst removed    SOCIAL HISTORY: Social History   Tobacco Use  . Smoking status: Never Smoker  . Smokeless tobacco: Never Used  Substance Use Topics  . Alcohol use: No    Alcohol/week: 0.0 oz  . Drug use: No    FAMILY HISTORY: Family History  Problem Relation Age of Onset  . Diabetes Mother   . Liver disease Mother        ESLD- unknown etiology  . Obesity Mother   . Diabetes Maternal Grandmother   . Diabetes Paternal Grandmother   . Breast cancer Paternal Grandmother   . Stroke Maternal Grandfather   . Hyperlipidemia Father     ROS: Review of Systems  Constitutional: Positive for malaise/fatigue and weight loss.  Cardiovascular: Negative for chest pain, palpitations and claudication.  Gastrointestinal: Negative for nausea and vomiting.  Genitourinary: Negative for frequency.  Musculoskeletal: Negative for myalgias.       Negative for muscle weakness  Endo/Heme/Allergies: Negative for polydipsia.       Negative for Heat or Cold Intolerance Negative for hyperphagia    PHYSICAL EXAM: Blood pressure 109/69, pulse 71, temperature 98.1 F (36.7 C), temperature source Oral, height 5' 9"  (1.753 m), weight (!) 327 lb (148.3 kg), SpO2 95 %. Body mass index is 48.29 kg/m. Physical Exam  Constitutional: She is oriented to person, place, and time. She appears well-developed and well-nourished.  Cardiovascular: Normal rate.  Pulmonary/Chest: Effort normal.    Musculoskeletal: Normal range of motion.  Neurological: She is oriented to person, place, and time.  Skin: Skin is warm and dry.  Psychiatric: She has a normal mood and affect. Her behavior is normal.  Vitals reviewed.   RECENT LABS AND TESTS: BMET    Component Value Date/Time   NA 139 05/23/2017 1249   K 4.1 05/23/2017 1249   CL 101 05/23/2017 1249   CO2 25 05/23/2017 1249   GLUCOSE 108 (H) 05/23/2017 1249   GLUCOSE 118 (H) 09/08/2015 1514   BUN 10 05/23/2017 1249   CREATININE 0.67 05/23/2017 1249  CREATININE 0.85 09/08/2015 1514   CALCIUM 9.2 05/23/2017 1249   GFRNONAA 108 05/23/2017 1249   GFRNONAA 85 09/08/2015 1514   GFRAA 125 05/23/2017 1249   GFRAA >89 09/08/2015 1514   Lab Results  Component Value Date   HGBA1C 5.8 (H) 05/23/2017   HGBA1C 5.7 04/12/2017   HGBA1C 5.8 (H) 05/19/2015   HGBA1C 5.7 06/06/2013   Lab Results  Component Value Date   INSULIN 30.4 (H) 05/23/2017   CBC    Component Value Date/Time   WBC 7.2 05/23/2017 1249   WBC 8.3 08/27/2015 1633   RBC 4.66 05/23/2017 1249   RBC 4.27 08/27/2015 1633   HGB 14.7 05/23/2017 1249   HCT 43.3 05/23/2017 1249   PLT 213 04/05/2017 0947   MCV 93 05/23/2017 1249   MCH 31.5 05/23/2017 1249   MCH 31.1 08/27/2015 1633   MCHC 33.9 05/23/2017 1249   MCHC 34.3 08/27/2015 1633   RDW 13.6 05/23/2017 1249   LYMPHSABS 2.8 05/23/2017 1249   MONOABS 0.4 10/27/2013 1604   EOSABS 0.3 05/23/2017 1249   BASOSABS 0.0 05/23/2017 1249   Iron/TIBC/Ferritin/ %Sat    Component Value Date/Time   IRON 95 06/27/2013 1645   TIBC 371 06/27/2013 1645   FERRITIN 401 (H) 06/27/2013 1645   IRONPCTSAT 26 06/27/2013 1645   Lipid Panel     Component Value Date/Time   CHOL 245 (H) 05/23/2017 1249   TRIG 127 05/23/2017 1249   HDL 77 05/23/2017 1249   CHOLHDL 5.4 (H) 03/30/2017 1509   CHOLHDL 5.0 06/06/2013 1654   VLDL 27 06/06/2013 1654   LDLCALC 143 (H) 05/23/2017 1249   Hepatic Function Panel     Component Value  Date/Time   PROT 5.7 (L) 05/23/2017 1249   ALBUMIN 3.7 05/23/2017 1249   AST 85 (H) 05/23/2017 1249   ALT 86 (H) 05/23/2017 1249   ALKPHOS 194 (H) 05/23/2017 1249   BILITOT 0.9 05/23/2017 1249   BILIDIR 0.2 04/07/2015 1349   IBILI 1.0 04/07/2015 1349      Component Value Date/Time   TSH 7.870 (H) 05/23/2017 1249   TSH 6.960 (H) 03/30/2017 1509   TSH 3.31 08/27/2015 1633   Results for FABIANA, DROMGOOLE (MRN [id number]) as of 06/06/2017 14:12  Ref. Range 05/23/2017 12:49  Vitamin D, 25-Hydroxy Latest Ref Range: 30.0 - 100.0 ng/mL 28.6 (L)   ASSESSMENT AND PLAN: Vitamin D deficiency - Plan: Vitamin D, Ergocalciferol, (DRISDOL) 50000 units CAPS capsule  Subclinical hypothyroidism - Plan: levothyroxine (SYNTHROID, LEVOTHROID) 25 MCG tablet  Prediabetes - Plan: metFORMIN (GLUCOPHAGE) 500 MG tablet  Other hyperlipidemia  At risk for diabetes mellitus  Class 3 severe obesity with serious comorbidity and body mass index (BMI) of 45.0 to 49.9 in adult, unspecified obesity type ([place])  PLAN:  Vitamin D Deficiency Erlean was informed that low vitamin D levels contributes to fatigue and are associated with obesity, breast, and colon cancer. She agrees to start to take prescription Vit D @50 ,000 IU every week #4 with no refills and will follow up for routine testing of vitamin D, at least 2-3 times per year. She was informed of the risk of over-replacement of vitamin D and agrees to not increase her dose unless she discusses this with [place] first.  Hypothyroidism subclinical Lakely was informed of the importance of good thyroid control to help with weight loss efforts. She was also informed that supertheraputic thyroid levels are dangerous and will not improve weight loss results. She agreed to start levothyroxine 25  mcg by mouth qAM #30 with no refills and follow up as directed.  Pre-Diabetes Lalena will continue to work on weight loss, exercise, and decreasing simple carbohydrates in her diet to help  decrease the risk of diabetes. We dicussed metformin including benefits and risks. She was informed that eating too many simple carbohydrates or too many calories at one sitting increases the likelihood of GI side effects. Lamya agreed to start metformin 500 mg by mouth qAM #30 with no refills and follow up with [place] as directed to monitor her progress.  Diabetes risk counseling Jennell was given extended (30 minutes) diabetes prevention counseling today. She is 42 y.o. female and has risk factors for diabetes including obesity and pre-diabetes. We discussed intensive lifestyle modifications today with an emphasis on weight loss as well as increasing exercise and decreasing simple carbohydrates in her diet.  Hyperlipidemia Amra was informed of the American Heart Association Guidelines emphasizing intensive lifestyle modifications as the first line treatment for hyperlipidemia. We discussed many lifestyle modifications today in depth, and Jonaya will continue to work on decreasing saturated fats such as fatty red meat, butter and many fried foods. She will also increase vegetables and lean protein in her diet and continue to work on exercise and weight loss efforts. Spring will continue lipitor 20 mg by mouth daily and follow up as directed.  Obesity Daphney is currently in the action stage of change. As such, her goal is to continue with weight loss efforts She has agreed to follow the Category 3 plan Shian has been instructed to work up to a goal of 150 minutes of combined cardio and strengthening exercise per week for weight loss and overall health benefits. We discussed the following Behavioral Modification Strategies today:planning for success, increasing lean protein intake, increasing vegetables and work on meal planning and easy cooking plans  Ashrita has agreed to follow up with our clinic in 2 weeks. She was informed of the importance of frequent follow up visits to maximize her success with intensive  lifestyle modifications for her multiple health conditions.   OBESITY BEHAVIORAL INTERVENTION VISIT  Today's visit was # 2 out of 22.  Starting weight: 338 lbs Starting date: 05/23/17 Today's weight : 327 lbs Today's date: 06/06/2017 Total lbs lost to date: 11 (Patients must lose 7 lbs in the first 6 months to continue with counseling)   ASK: We discussed the diagnosis of obesity with [name] today and Rayan agreed to give [place] permission to discuss obesity behavioral modification therapy today.  ASSESS: Daanya has the diagnosis of obesity and her BMI today is 48.27 Carolee is in the action stage of change   ADVISE: Jennel was educated on the multiple health risks of obesity as well as the benefit of weight loss to improve her health. She was advised of the need for long term treatment and the importance of lifestyle modifications.  AGREE: Multiple dietary modification options and treatment options were discussed and  Mitra agreed to the above obesity treatment plan.  I, Doreene Nest, am acting as transcriptionist for Eber Jones, MD  I have reviewed the above documentation for accuracy and completeness, and I agree with the above. - Ilene Qua, MD

## 2017-06-14 ENCOUNTER — Encounter: Payer: Self-pay | Admitting: Physician Assistant

## 2017-06-20 ENCOUNTER — Encounter: Payer: Self-pay | Admitting: Physician Assistant

## 2017-06-21 ENCOUNTER — Ambulatory Visit (INDEPENDENT_AMBULATORY_CARE_PROVIDER_SITE_OTHER): Payer: BC Managed Care – PPO | Admitting: Family Medicine

## 2017-06-21 ENCOUNTER — Telehealth: Payer: Self-pay | Admitting: Physician Assistant

## 2017-06-21 VITALS — BP 115/74 | HR 62 | Temp 98.4°F | Ht 69.0 in | Wt 326.0 lb

## 2017-06-21 DIAGNOSIS — E039 Hypothyroidism, unspecified: Secondary | ICD-10-CM

## 2017-06-21 DIAGNOSIS — E559 Vitamin D deficiency, unspecified: Secondary | ICD-10-CM

## 2017-06-21 DIAGNOSIS — R7303 Prediabetes: Secondary | ICD-10-CM | POA: Diagnosis not present

## 2017-06-21 DIAGNOSIS — Z6841 Body Mass Index (BMI) 40.0 and over, adult: Secondary | ICD-10-CM

## 2017-06-21 DIAGNOSIS — Z9189 Other specified personal risk factors, not elsewhere classified: Secondary | ICD-10-CM | POA: Diagnosis not present

## 2017-06-21 DIAGNOSIS — E038 Other specified hypothyroidism: Secondary | ICD-10-CM

## 2017-06-21 MED ORDER — VITAMIN D (ERGOCALCIFEROL) 1.25 MG (50000 UNIT) PO CAPS: 50000.0000 [IU] | ORAL_CAPSULE | ORAL | 0 refills | Status: DC

## 2017-06-21 MED ORDER — METFORMIN HCL 500 MG PO TABS: 500.0000 mg | ORAL_TABLET | Freq: Every day | ORAL | 0 refills | Status: DC

## 2017-06-21 MED ORDER — LEVOTHYROXINE SODIUM 25 MCG PO TABS: 25.0000 ug | ORAL_TABLET | Freq: Every day | ORAL | 0 refills | Status: DC

## 2017-06-21 NOTE — Telephone Encounter (Signed)
Called and spoke with pt about Chelle leaving. Pt chose to make an apt with [facility]. I advised of building number, time and late policy. I also advised pt that she would be receiving a letter from PCP.

## 2017-06-21 NOTE — Progress Notes (Signed)
Office: [phone]  /  Fax: [phone]   HPI:   Chief Complaint: OBESITY Melanie [name] is here to discuss her progress with her obesity treatment plan. She is on the Category 3 plan and is following her eating plan approximately 85 % of the time. She states she is exercising 0 minutes 0 times per week. Past couple of weeks Melanie [name] had her son's birthday and had 2 cakes, which was difficult to say "no" to. Plan is getting easier and harder. Her weight is (!) 326 lb (147.9 kg) today and has had a weight loss of 1 pound over a period of 2 weeks since her last visit. She has lost 12 lbs since starting treatment with [place].  Vitamin D Deficiency Melanie [name] has a diagnosis of vitamin D deficiency. She is currently taking prescription Vit D and denies nausea, vomiting or muscle weakness.  At risk for osteopenia and osteoporosis Melanie [name] is at higher risk of osteopenia and osteoporosis due to vitamin D deficiency.   Hypothyroidism Melanie [name] has a diagnosis of hypothyroidism. She is on levothyroxine. She denies change in energy, hot or cold intolerance or palpitations.  Pre-Diabetes Melanie [name] has a diagnosis of pre-diabetes based on her elevated Hgb A1c and was informed this puts her at greater risk of developing diabetes. She is taking metformin currently and continues to work on diet and exercise to decrease risk of diabetes. She denies nausea or feelings of hypoglycemia.  ALLERGIES: Allergies  Allergen Reactions  . Lidocaine Other (See Comments)    Flu like sx's  . Relafen [Nabumetone] Other (See Comments)    migraine  . Celebrex [Celecoxib] Rash  . Sulfa Antibiotics Rash    MEDICATIONS: Current Outpatient Medications on File Prior to Visit  Medication Sig Dispense Refill  . atorvastatin (LIPITOR) 20 MG tablet Take 1 tablet (20 mg total) by mouth daily. 90 tablet 3  . buPROPion (WELLBUTRIN) 75 MG tablet Take 2 tablets (150 mg total) by mouth 2 (two) times daily. 360 tablet 3  . busPIRone (BUSPAR) 15 MG tablet  TAKE 1 TABLET BY MOUTH TWICE A DAY OR AS DIRECTED. 60 tablet 3  . cetirizine (ZYRTEC) 10 MG tablet Take 10 mg by mouth daily.    . citalopram (CELEXA) 20 MG tablet TAKE ONE TABLET BY MOUTH DAILY 90 tablet 3  . fluticasone (FLONASE) 50 MCG/ACT nasal spray Place 2 sprays into both nostrils daily. 48 g 3  . furosemide (LASIX) 20 MG tablet Take 1 tablet (20 mg total) by mouth daily. 90 tablet 3  . levothyroxine (SYNTHROID, LEVOTHROID) 25 MCG tablet Take 1 tablet (25 mcg total) by mouth daily before breakfast. 30 tablet 0  . LORazepam (ATIVAN) 1 MG tablet Take 1 tablet (1 mg total) by mouth 2 (two) times daily as needed for anxiety. 20 tablet 0  . metFORMIN (GLUCOPHAGE) 500 MG tablet Take 1 tablet (500 mg total) by mouth daily with breakfast. 30 tablet 0  . montelukast (SINGULAIR) 10 MG tablet Take 1 tablet (10 mg total) by mouth at bedtime. 90 tablet 3  . Multiple Vitamin (MULTIVITAMIN) tablet Take 1 tablet by mouth daily.    . Omega-3 Fatty Acids (FISH OIL PO) Take by mouth daily.    . Vitamin D, Ergocalciferol, (DRISDOL) 50000 units CAPS capsule Take 1 capsule (50,000 Units total) by mouth every 7 (seven) days. 4 capsule 0   Current Facility-Administered Medications on File Prior to Visit  Medication Dose Route Frequency Provider Last Rate Last Dose  . cefTRIAXone (ROCEPHIN) injection 1 g  1 g Intramuscular Once Rutherford Guys, MD        PAST MEDICAL HISTORY: Past Medical History:  Diagnosis Date  . Allergy   . Anemia   . Anxiety   . Back pain   . Constipation   . Depression   . Fatty liver   . GERD (gastroesophageal reflux disease)   . Hyperlipidemia   . Infertility, female   . Joint pain   . Kidney problem   . Membranous nephrosis   . Multiple food allergies   . Muscle pain   . Numbness and tingling in both hands   . Pre-diabetes   . Shortness of breath   . Sleep apnea   . Stress   . Swelling of both lower extremities     PAST SURGICAL HISTORY: Past Surgical History:    Procedure Laterality Date  . FINGER SURGERY Left     index finger  . KNEE SURGERY Left   . LIVER BIOPSY N/A 05/15/2014   Procedure: LIVER BIOPSY;  Surgeon: Inda Castle, MD;  Location: [facility];  Service: Endoscopy;  Laterality: N/A;  ultrasound to mark the liver  . TONSILLECTOMY AND ADENOIDECTOMY  1978  . TYMPANOSTOMY TUBE PLACEMENT    . wrist cyst Left   . WRIST SURGERY     left - cyst removed    SOCIAL HISTORY: Social History   Tobacco Use  . Smoking status: Never Smoker  . Smokeless tobacco: Never Used  Substance Use Topics  . Alcohol use: No    Alcohol/week: 0.0 oz  . Drug use: No    FAMILY HISTORY: Family History  Problem Relation Age of Onset  . Diabetes Mother   . Liver disease Mother        ESLD- unknown etiology  . Obesity Mother   . Diabetes Maternal Grandmother   . Diabetes Paternal Grandmother   . Breast cancer Paternal Grandmother   . Stroke Maternal Grandfather   . Hyperlipidemia Father     ROS: Review of Systems  Constitutional: Positive for weight loss. Negative for malaise/fatigue.       Negative hot/cold intolerance  Cardiovascular: Negative for palpitations.  Gastrointestinal: Negative for nausea and vomiting.  Musculoskeletal:       Negative muscle weakness  Endo/Heme/Allergies:       Negative hypoglycemia    PHYSICAL EXAM: Blood pressure 115/74, pulse 62, temperature 98.4 F (36.9 C), temperature source Oral, height 5' 9"  (1.753 m), weight (!) 326 lb (147.9 kg), SpO2 96 %. Body mass index is 48.14 kg/m. Physical Exam  Constitutional: She is oriented to person, place, and time. She appears well-developed and well-nourished.  Cardiovascular: Normal rate.  Pulmonary/Chest: Effort normal.  Musculoskeletal: Normal range of motion.  Neurological: She is oriented to person, place, and time.  Skin: Skin is warm and dry.  Psychiatric: She has a normal mood and affect. Her behavior is normal.  Vitals reviewed.   RECENT LABS AND  TESTS: BMET    Component Value Date/Time   NA 139 05/23/2017 1249   K 4.1 05/23/2017 1249   CL 101 05/23/2017 1249   CO2 25 05/23/2017 1249   GLUCOSE 108 (H) 05/23/2017 1249   GLUCOSE 118 (H) 09/08/2015 1514   BUN 10 05/23/2017 1249   CREATININE 0.67 05/23/2017 1249   CREATININE 0.85 09/08/2015 1514   CALCIUM 9.2 05/23/2017 1249   GFRNONAA 108 05/23/2017 1249   GFRNONAA 85 09/08/2015 1514   GFRAA 125 05/23/2017 1249   GFRAA >89 09/08/2015 1514  Lab Results  Component Value Date   HGBA1C 5.8 (H) 05/23/2017   HGBA1C 5.7 04/12/2017   HGBA1C 5.8 (H) 05/19/2015   HGBA1C 5.7 06/06/2013   Lab Results  Component Value Date   INSULIN 30.4 (H) 05/23/2017   CBC    Component Value Date/Time   WBC 7.2 05/23/2017 1249   WBC 8.3 08/27/2015 1633   RBC 4.66 05/23/2017 1249   RBC 4.27 08/27/2015 1633   HGB 14.7 05/23/2017 1249   HCT 43.3 05/23/2017 1249   PLT 213 04/05/2017 0947   MCV 93 05/23/2017 1249   MCH 31.5 05/23/2017 1249   MCH 31.1 08/27/2015 1633   MCHC 33.9 05/23/2017 1249   MCHC 34.3 08/27/2015 1633   RDW 13.6 05/23/2017 1249   LYMPHSABS 2.8 05/23/2017 1249   MONOABS 0.4 10/27/2013 1604   EOSABS 0.3 05/23/2017 1249   BASOSABS 0.0 05/23/2017 1249   Iron/TIBC/Ferritin/ %Sat    Component Value Date/Time   IRON 95 06/27/2013 1645   TIBC 371 06/27/2013 1645   FERRITIN 401 (H) 06/27/2013 1645   IRONPCTSAT 26 06/27/2013 1645   Lipid Panel     Component Value Date/Time   CHOL 245 (H) 05/23/2017 1249   TRIG 127 05/23/2017 1249   HDL 77 05/23/2017 1249   CHOLHDL 5.4 (H) 03/30/2017 1509   CHOLHDL 5.0 06/06/2013 1654   VLDL 27 06/06/2013 1654   LDLCALC 143 (H) 05/23/2017 1249   Hepatic Function Panel     Component Value Date/Time   PROT 5.7 (L) 05/23/2017 1249   ALBUMIN 3.7 05/23/2017 1249   AST 85 (H) 05/23/2017 1249   ALT 86 (H) 05/23/2017 1249   ALKPHOS 194 (H) 05/23/2017 1249   BILITOT 0.9 05/23/2017 1249   BILIDIR 0.2 04/07/2015 1349   IBILI 1.0  04/07/2015 1349      Component Value Date/Time   TSH 7.870 (H) 05/23/2017 1249   TSH 6.960 (H) 03/30/2017 1509   TSH 3.31 08/27/2015 1633  Results for WRENLEY, SAYED (MRN [id number]) as of 06/21/2017 12:10  Ref. Range 05/23/2017 12:49  Vitamin D, 25-Hydroxy Latest Ref Range: 30.0 - 100.0 ng/mL 28.6 (L)    ASSESSMENT AND PLAN: Vitamin D deficiency - Plan: Vitamin D, Ergocalciferol, (DRISDOL) 50000 units CAPS capsule  Subclinical hypothyroidism - Plan: levothyroxine (SYNTHROID, LEVOTHROID) 25 MCG tablet  Prediabetes - Plan: metFORMIN (GLUCOPHAGE) 500 MG tablet  At risk for osteoporosis  Class 3 severe obesity with serious comorbidity and body mass index (BMI) of 45.0 to 49.9 in adult, unspecified obesity type ([place])  PLAN:  Vitamin D Deficiency Marlaya was informed that low vitamin D levels contributes to fatigue and are associated with obesity, breast, and colon cancer. Pina agrees to continue taking prescription Vit D @50 ,000 IU every week #4 and we will refill for 1 month. She will follow up for routine testing of vitamin D, at least 2-3 times per year. She was informed of the risk of over-replacement of vitamin D and agrees to not increase her dose unless she discusses this with [place] first. Draven agrees to follow up with our clinic in 2 weeks.  At risk for osteopenia and osteoporosis Lurlene is at risk for osteopenia and osteoporsis due to her vitamin D deficiency. She was encouraged to take her vitamin D and follow her higher calcium diet and increase strengthening exercise to help strengthen her bones and decrease her risk of osteopenia and osteoporosis.  Hypothyroidism Tanicia was informed of the importance of good thyroid control to help with weight  loss efforts. She was also informed that supertheraputic thyroid levels are dangerous and will not improve weight loss results. Rinnah agrees to continue taking levothyroxine 25 mcg PO q AM #30 and we will refill for 1 month. Cheyene agrees to follow up  with our clinic in 2 weeks.  Pre-Diabetes Brielle will continue to work on weight loss, exercise, and decreasing simple carbohydrates in her diet to help decrease the risk of diabetes. We dicussed metformin including benefits and risks. She was informed that eating too many simple carbohydrates or too many calories at one sitting increases the likelihood of GI side effects. Keyerra agrees to continue taking metformin 500 mg PO q AM #30 and we will refill for 1 month. Lorilee agrees to follow up with our clinic in 2 weeks as directed to monitor her progress.  Obesity Estephania is currently in the action stage of change. As such, her goal is to continue with weight loss efforts She has agreed to follow the Category 3 plan Brendalee has been instructed to work up to a goal of 150 minutes of combined cardio and strengthening exercise per week for weight loss and overall health benefits. We discussed the following Behavioral Modification Strategies today: increasing lean protein intake, decreasing simple carbohydrates, increasing vegetables, and planning for success   Zaliyah has agreed to follow up with our clinic in 2 weeks. She was informed of the importance of frequent follow up visits to maximize her success with intensive lifestyle modifications for her multiple health conditions.   OBESITY BEHAVIORAL INTERVENTION VISIT  Today's visit was # 3 out of 22.  Starting weight: 338 lbs Starting date: 05/23/17 Today's weight : 326 lbs Today's date: 06/21/2017 Total lbs lost to date: 12 (Patients must lose 7 lbs in the first 6 months to continue with counseling)   ASK: We discussed the diagnosis of obesity with [name] today and Izzah agreed to give [place] permission to discuss obesity behavioral modification therapy today.  ASSESS: Etna has the diagnosis of obesity and her BMI today is 48.12 Magdalene is in the action stage of change   ADVISE: Shephanie was educated on the multiple health risks of obesity as well as the  benefit of weight loss to improve her health. She was advised of the need for long term treatment and the importance of lifestyle modifications.  AGREE: Multiple dietary modification options and treatment options were discussed and  Salimah agreed to the above obesity treatment plan.  I, Trixie Dredge, am acting as transcriptionist for Ilene Qua, MD  I have reviewed the above documentation for accuracy and completeness, and I agree with the above. - Ilene Qua, MD

## 2017-06-30 ENCOUNTER — Other Ambulatory Visit (INDEPENDENT_AMBULATORY_CARE_PROVIDER_SITE_OTHER): Payer: Self-pay | Admitting: Family Medicine

## 2017-06-30 DIAGNOSIS — E559 Vitamin D deficiency, unspecified: Secondary | ICD-10-CM

## 2017-07-02 ENCOUNTER — Other Ambulatory Visit (INDEPENDENT_AMBULATORY_CARE_PROVIDER_SITE_OTHER): Payer: Self-pay | Admitting: Family Medicine

## 2017-07-02 DIAGNOSIS — R7303 Prediabetes: Secondary | ICD-10-CM

## 2017-07-04 ENCOUNTER — Encounter (INDEPENDENT_AMBULATORY_CARE_PROVIDER_SITE_OTHER): Payer: Self-pay | Admitting: Family Medicine

## 2017-07-04 ENCOUNTER — Other Ambulatory Visit (INDEPENDENT_AMBULATORY_CARE_PROVIDER_SITE_OTHER): Payer: Self-pay | Admitting: Family Medicine

## 2017-07-04 DIAGNOSIS — E038 Other specified hypothyroidism: Secondary | ICD-10-CM

## 2017-07-04 DIAGNOSIS — E039 Hypothyroidism, unspecified: Secondary | ICD-10-CM

## 2017-07-09 ENCOUNTER — Ambulatory Visit (INDEPENDENT_AMBULATORY_CARE_PROVIDER_SITE_OTHER): Payer: BC Managed Care – PPO | Admitting: Family Medicine

## 2017-07-09 VITALS — BP 124/78 | HR 62 | Temp 97.9°F | Ht 69.0 in | Wt 322.0 lb

## 2017-07-09 DIAGNOSIS — Z6841 Body Mass Index (BMI) 40.0 and over, adult: Secondary | ICD-10-CM | POA: Diagnosis not present

## 2017-07-09 DIAGNOSIS — E559 Vitamin D deficiency, unspecified: Secondary | ICD-10-CM | POA: Diagnosis not present

## 2017-07-09 DIAGNOSIS — R7303 Prediabetes: Secondary | ICD-10-CM | POA: Diagnosis not present

## 2017-07-09 DIAGNOSIS — E66813 Obesity, class 3: Secondary | ICD-10-CM

## 2017-07-11 NOTE — Progress Notes (Signed)
Office: [phone]  /  Fax: [phone]   HPI:   Chief Complaint: OBESITY Melanie [name] is here to discuss her progress with her obesity treatment plan. She is on the Category 3 plan and is following her eating plan approximately 85 % of the time. She states she is exercising 0 minutes 0 times per week. Melanie [name] has portioned put food to make it easier to grab and go. Occasionally hungry at appropriate times when she hasn't eaten. 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  Her weight is (!) 322 lb (146.1 kg) today and has had a weight loss of 4 pounds over a period of 2 to 3 weeks since her last visit. She has lost 16 lbs since starting treatment with [place].  Pre-Diabetes Melanie [name] has a diagnosis of pre-diabetes based on her elevated Hgb A1c and was informed this puts her at greater risk of developing diabetes. She notes 1-2 episodes of diarrhea secondary to eating out. She has no carbohydrate cravings. She is taking metformin currently and continues to work on diet and exercise to decrease risk of diabetes. She denies nausea or hypoglycemia.  Vitamin D Deficiency Melanie [name] has a diagnosis of vitamin D deficiency. She is currently taking prescription Vit D and denies nausea, vomiting or muscle weakness.  ALLERGIES: Allergies  Allergen Reactions  . Lidocaine Other (See Comments)    Flu like sx's  . Relafen [Nabumetone] Other (See Comments)    migraine  . Celebrex [Celecoxib] Rash  . Sulfa Antibiotics Rash    MEDICATIONS: Current Outpatient Medications on File Prior to Visit  Medication Sig Dispense Refill  . atorvastatin (LIPITOR) 20 MG tablet Take 1 tablet (20 mg total) by mouth daily. 90 tablet 3  . buPROPion (WELLBUTRIN) 75 MG tablet Take 2 tablets (150 mg total) by mouth 2 (two) times daily. 360 tablet 3  . busPIRone (BUSPAR) 15 MG tablet TAKE 1 TABLET BY MOUTH TWICE A DAY OR AS DIRECTED. 60 tablet 3  . cetirizine (ZYRTEC) 10 MG tablet Take  10 mg by mouth daily.    . citalopram (CELEXA) 20 MG tablet TAKE ONE TABLET BY MOUTH DAILY 90 tablet 3  . fluticasone (FLONASE) 50 MCG/ACT nasal spray Place 2 sprays into both nostrils daily. 48 g 3  . furosemide (LASIX) 20 MG tablet Take 1 tablet (20 mg total) by mouth daily. 90 tablet 3  . levothyroxine (SYNTHROID, LEVOTHROID) 25 MCG tablet Take 1 tablet (25 mcg total) by mouth daily before breakfast. 30 tablet 0  . LORazepam (ATIVAN) 1 MG tablet Take 1 tablet (1 mg total) by mouth 2 (two) times daily as needed for anxiety. 20 tablet 0  . metFORMIN (GLUCOPHAGE) 500 MG tablet Take 1 tablet (500 mg total) by mouth daily with breakfast. 30 tablet 0  . montelukast (SINGULAIR) 10 MG tablet Take 1 tablet (10 mg total) by mouth at bedtime. 90 tablet 3  . Multiple Vitamin (MULTIVITAMIN) tablet Take 1 tablet by mouth daily.    . Omega-3 Fatty Acids (FISH OIL PO) Take by mouth daily.    . Vitamin D, Ergocalciferol, (DRISDOL) 50000 units CAPS capsule Take 1 capsule (50,000 Units total) by mouth every 7 (seven) days. 4 capsule 0   Current Facility-Administered Medications on File Prior to Visit  Medication Dose Route Frequency Provider Last Rate Last Dose  . cefTRIAXone (ROCEPHIN) injection 1 g  1 g Intramuscular Once Rutherford Guys, MD  PAST MEDICAL HISTORY: Past Medical History:  Diagnosis Date  . Allergy   . Anemia   . Anxiety   . Back pain   . Constipation   . Depression   . Fatty liver   . GERD (gastroesophageal reflux disease)   . Hyperlipidemia   . Infertility, female   . Joint pain   . Kidney problem   . Membranous nephrosis   . Multiple food allergies   . Muscle pain   . Numbness and tingling in both hands   . Pre-diabetes   . Shortness of breath   . Sleep apnea   . Stress   . Swelling of both lower extremities     PAST SURGICAL HISTORY: Past Surgical History:  Procedure Laterality Date  . FINGER SURGERY Left     index finger  . KNEE SURGERY Left   . LIVER  BIOPSY N/A 05/15/2014   Procedure: LIVER BIOPSY;  Surgeon: Inda Castle, MD;  Location: [facility];  Service: Endoscopy;  Laterality: N/A;  ultrasound to mark the liver  . TONSILLECTOMY AND ADENOIDECTOMY  1978  . TYMPANOSTOMY TUBE PLACEMENT    . wrist cyst Left   . WRIST SURGERY     left - cyst removed    SOCIAL HISTORY: Social History   Tobacco Use  . Smoking status: Never Smoker  . Smokeless tobacco: Never Used  Substance Use Topics  . Alcohol use: No    Alcohol/week: 0.0 oz  . Drug use: No    FAMILY HISTORY: Family History  Problem Relation Age of Onset  . Diabetes Mother   . Liver disease Mother        ESLD- unknown etiology  . Obesity Mother   . Diabetes Maternal Grandmother   . Diabetes Paternal Grandmother   . Breast cancer Paternal Grandmother   . Stroke Maternal Grandfather   . Hyperlipidemia Father     ROS: Review of Systems  Constitutional: Positive for weight loss.  Gastrointestinal: Positive for diarrhea. Negative for nausea and vomiting.  Musculoskeletal:       Negative muscle weakness  Endo/Heme/Allergies:       Negative hypoglycemia    PHYSICAL EXAM: Blood pressure 124/78, pulse 62, temperature 97.9 F (36.6 C), height 5' 9"  (1.753 m), weight (!) 322 lb (146.1 kg), SpO2 95 %. Body mass index is 47.55 kg/m. Physical Exam  Constitutional: She is oriented to person, place, and time. She appears well-developed and well-nourished.  Cardiovascular: Normal rate.  Pulmonary/Chest: Effort normal.  Musculoskeletal: Normal range of motion.  Neurological: She is oriented to person, place, and time.  Skin: Skin is warm and dry.  Psychiatric: She has a normal mood and affect. Her behavior is normal.  Vitals reviewed.   RECENT LABS AND TESTS: BMET    Component Value Date/Time   NA 139 05/23/2017 1249   K 4.1 05/23/2017 1249   CL 101 05/23/2017 1249   CO2 25 05/23/2017 1249   GLUCOSE 108 (H) 05/23/2017 1249   GLUCOSE 118 (H) 09/08/2015 1514    BUN 10 05/23/2017 1249   CREATININE 0.67 05/23/2017 1249   CREATININE 0.85 09/08/2015 1514   CALCIUM 9.2 05/23/2017 1249   GFRNONAA 108 05/23/2017 1249   GFRNONAA 85 09/08/2015 1514   GFRAA 125 05/23/2017 1249   GFRAA >89 09/08/2015 1514   Lab Results  Component Value Date   HGBA1C 5.8 (H) 05/23/2017   HGBA1C 5.7 04/12/2017   HGBA1C 5.8 (H) 05/19/2015   HGBA1C 5.7 06/06/2013   Lab  Results  Component Value Date   INSULIN 30.4 (H) 05/23/2017   CBC    Component Value Date/Time   WBC 7.2 05/23/2017 1249   WBC 8.3 08/27/2015 1633   RBC 4.66 05/23/2017 1249   RBC 4.27 08/27/2015 1633   HGB 14.7 05/23/2017 1249   HCT 43.3 05/23/2017 1249   PLT 213 04/05/2017 0947   MCV 93 05/23/2017 1249   MCH 31.5 05/23/2017 1249   MCH 31.1 08/27/2015 1633   MCHC 33.9 05/23/2017 1249   MCHC 34.3 08/27/2015 1633   RDW 13.6 05/23/2017 1249   LYMPHSABS 2.8 05/23/2017 1249   MONOABS 0.4 10/27/2013 1604   EOSABS 0.3 05/23/2017 1249   BASOSABS 0.0 05/23/2017 1249   Iron/TIBC/Ferritin/ %Sat    Component Value Date/Time   IRON 95 06/27/2013 1645   TIBC 371 06/27/2013 1645   FERRITIN 401 (H) 06/27/2013 1645   IRONPCTSAT 26 06/27/2013 1645   Lipid Panel     Component Value Date/Time   CHOL 245 (H) 05/23/2017 1249   TRIG 127 05/23/2017 1249   HDL 77 05/23/2017 1249   CHOLHDL 5.4 (H) 03/30/2017 1509   CHOLHDL 5.0 06/06/2013 1654   VLDL 27 06/06/2013 1654   LDLCALC 143 (H) 05/23/2017 1249   Hepatic Function Panel     Component Value Date/Time   PROT 5.7 (L) 05/23/2017 1249   ALBUMIN 3.7 05/23/2017 1249   AST 85 (H) 05/23/2017 1249   ALT 86 (H) 05/23/2017 1249   ALKPHOS 194 (H) 05/23/2017 1249   BILITOT 0.9 05/23/2017 1249   BILIDIR 0.2 04/07/2015 1349   IBILI 1.0 04/07/2015 1349      Component Value Date/Time   TSH 7.870 (H) 05/23/2017 1249   TSH 6.960 (H) 03/30/2017 1509   TSH 3.31 08/27/2015 1633  Results for TYKESHA, KONICKI (MRN [id number]) as of 07/11/2017 14:03  Ref. Range  05/23/2017 12:49  Vitamin D, 25-Hydroxy Latest Ref Range: 30.0 - 100.0 ng/mL 28.6 (L)    ASSESSMENT AND PLAN: Prediabetes  Vitamin D deficiency  Class 3 severe obesity with serious comorbidity and body mass index (BMI) of 45.0 to 49.9 in adult, unspecified obesity type [facility])  PLAN:  Pre-Diabetes Melanie [name] will continue to work on weight loss, exercise, and decreasing simple carbohydrates in her diet to help decrease the risk of diabetes. We dicussed metformin including benefits and risks. She was informed that eating too many simple carbohydrates or too many calories at one sitting increases the likelihood of GI side effects. Sharmin agrees to continue taking metformin 500 mg PO q AM, no refill needed. Melanie [name] agrees to follow up with our clinic in 2 weeks as directed to monitor her progress.  Vitamin D Deficiency Melanie [name] was informed that low vitamin D levels contributes to fatigue and are associated with obesity, breast, and colon cancer. Melanie [name] agrees to continue taking prescription Vit D @50 ,000 IU every week, no refill needed. She will follow up for routine testing of vitamin D, at least 2-3 times per year. She was informed of the risk of over-replacement of vitamin D and agrees to not increase her dose unless she discusses this with [place] first. Melanie [name] agrees to follow up with our clinic in 2 weeks.  We spent > than 50% of the 15 minute visit on the counseling as documented in the note.  Obesity Melanie [name] is currently in the action stage of change. As such, her goal is to continue with weight loss efforts She has agreed to follow the Category 3 plan Melanie [name]  has been instructed to work up to a goal of 150 minutes of combined cardio and strengthening exercise per week for weight loss and overall health benefits. We discussed the following Behavioral Modification Strategies today: increasing lean protein intake, increasing vegetables, work on meal planning and easy cooking plans, better snacking choices, and planning  for success   Melanie [name] has agreed to follow up with our clinic in 2 weeks. She was informed of the importance of frequent follow up visits to maximize her success with intensive lifestyle modifications for her multiple health conditions.   OBESITY BEHAVIORAL INTERVENTION VISIT  Today's visit was # 4 out of 22.  Starting weight: 338 lbs Starting date: 05/23/17 Today's weight : 322 lbs  Today's date: 07/09/2017 Total lbs lost to date: 16 (Patients must lose 7 lbs in the first 6 months to continue with counseling)   ASK: We discussed the diagnosis of obesity with Melanie [name] today and Melanie [name] agreed to give [place] permission to discuss obesity behavioral modification therapy today.  ASSESS: Melanie [name] has the diagnosis of obesity and her BMI today is 47.53 Makeila is in the action stage of change   ADVISE: Akshita was educated on the multiple health risks of obesity as well as the benefit of weight loss to improve her health. She was advised of the need for long term treatment and the importance of lifestyle modifications.  AGREE: Multiple dietary modification options and treatment options were discussed and  Melanie [name] agreed to the above obesity treatment plan.  I, Melanie [name], am acting as transcriptionist for Melanie Qua, MD  I have reviewed the above documentation for accuracy and completeness, and I agree with the above. - Melanie Qua, MD

## 2017-07-22 ENCOUNTER — Other Ambulatory Visit (INDEPENDENT_AMBULATORY_CARE_PROVIDER_SITE_OTHER): Payer: Self-pay | Admitting: Family Medicine

## 2017-07-22 DIAGNOSIS — E559 Vitamin D deficiency, unspecified: Secondary | ICD-10-CM

## 2017-07-24 ENCOUNTER — Ambulatory Visit (INDEPENDENT_AMBULATORY_CARE_PROVIDER_SITE_OTHER): Payer: BC Managed Care – PPO | Admitting: Family Medicine

## 2017-07-24 VITALS — BP 109/69 | HR 70 | Temp 97.6°F | Ht 69.0 in | Wt 315.0 lb

## 2017-07-24 DIAGNOSIS — E039 Hypothyroidism, unspecified: Secondary | ICD-10-CM | POA: Diagnosis not present

## 2017-07-24 DIAGNOSIS — R7303 Prediabetes: Secondary | ICD-10-CM | POA: Diagnosis not present

## 2017-07-24 DIAGNOSIS — E66813 Obesity, class 3: Secondary | ICD-10-CM

## 2017-07-24 DIAGNOSIS — E038 Other specified hypothyroidism: Secondary | ICD-10-CM

## 2017-07-24 DIAGNOSIS — Z6841 Body Mass Index (BMI) 40.0 and over, adult: Secondary | ICD-10-CM | POA: Diagnosis not present

## 2017-07-24 MED ORDER — METFORMIN HCL 500 MG PO TABS: 500.0000 mg | ORAL_TABLET | Freq: Every day | ORAL | 0 refills | Status: DC

## 2017-07-24 NOTE — Progress Notes (Signed)
Office: [phone]  /  Fax: [phone]   HPI:   Chief Complaint: OBESITY Melanie [name] is here to discuss her progress with her obesity treatment plan. She is on the Category 3 plan and is following her eating plan approximately 80 % of the time. She states she is exercising 0 minutes 0 times per week. Melanie [name] is sticking on plan almost 100%. Planning on conference in [place], [place] in 2 weeks.  Her weight is (!) 315 lb (142.9 kg) today and has had a weight loss of 7 pounds over a period of 2 weeks since her last visit. She has lost 23 lbs since starting treatment with [place].  Hypothyroidism Melanie [name] has a diagnosis of hypothyroidism. She is on levothyroxine. Last TSH (3/13) >7. She denies hot or cold intolerance or palpitations, and notes improving fatigue.  Pre-Diabetes Melanie [name] has a diagnosis of pre-diabetes based on her elevated Hgb A1c and was informed this puts her at greater risk of developing diabetes. She is taking metformin currently and continues to work on diet and exercise to decrease risk of diabetes. She denies GI side effects, nausea or hypoglycemia.  ALLERGIES: Allergies  Allergen Reactions  . Lidocaine Other (See Comments)    Flu like sx's  . Relafen [Nabumetone] Other (See Comments)    migraine  . Celebrex [Celecoxib] Rash  . Sulfa Antibiotics Rash    MEDICATIONS: Current Outpatient Medications on File Prior to Visit  Medication Sig Dispense Refill  . atorvastatin (LIPITOR) 20 MG tablet Take 1 tablet (20 mg total) by mouth daily. 90 tablet 3  . buPROPion (WELLBUTRIN) 75 MG tablet Take 2 tablets (150 mg total) by mouth 2 (two) times daily. 360 tablet 3  . busPIRone (BUSPAR) 15 MG tablet TAKE 1 TABLET BY MOUTH TWICE A DAY OR AS DIRECTED. 60 tablet 3  . cetirizine (ZYRTEC) 10 MG tablet Take 10 mg by mouth daily.    . citalopram (CELEXA) 20 MG tablet TAKE ONE TABLET BY MOUTH DAILY 90 tablet 3  . fluticasone (FLONASE) 50 MCG/ACT nasal spray Place 2 sprays into both nostrils daily. 48 g  3  . furosemide (LASIX) 20 MG tablet Take 1 tablet (20 mg total) by mouth daily. 90 tablet 3  . levothyroxine (SYNTHROID, LEVOTHROID) 25 MCG tablet Take 1 tablet (25 mcg total) by mouth daily before breakfast. 30 tablet 0  . LORazepam (ATIVAN) 1 MG tablet Take 1 tablet (1 mg total) by mouth 2 (two) times daily as needed for anxiety. 20 tablet 0  . montelukast (SINGULAIR) 10 MG tablet Take 1 tablet (10 mg total) by mouth at bedtime. 90 tablet 3  . Multiple Vitamin (MULTIVITAMIN) tablet Take 1 tablet by mouth daily.    . Omega-3 Fatty Acids (FISH OIL PO) Take by mouth daily.    . Vitamin D, Ergocalciferol, (DRISDOL) 50000 units CAPS capsule Take 1 capsule (50,000 Units total) by mouth every 7 (seven) days. 4 capsule 0   Current Facility-Administered Medications on File Prior to Visit  Medication Dose Route Frequency Provider Last Rate Last Dose  . cefTRIAXone (ROCEPHIN) injection 1 g  1 g Intramuscular Once Rutherford Guys, MD        PAST MEDICAL HISTORY: Past Medical History:  Diagnosis Date  . Allergy   . Anemia   . Anxiety   . Back pain   . Constipation   . Depression   . Fatty liver   . GERD (gastroesophageal reflux disease)   . Hyperlipidemia   . Infertility, female   .  Joint pain   . Kidney problem   . Membranous nephrosis   . Multiple food allergies   . Muscle pain   . Numbness and tingling in both hands   . Pre-diabetes   . Shortness of breath   . Sleep apnea   . Stress   . Swelling of both lower extremities     PAST SURGICAL HISTORY: Past Surgical History:  Procedure Laterality Date  . FINGER SURGERY Left     index finger  . KNEE SURGERY Left   . LIVER BIOPSY N/A 05/15/2014   Procedure: LIVER BIOPSY;  Surgeon: Inda Castle, MD;  Location: [facility];  Service: Endoscopy;  Laterality: N/A;  ultrasound to mark the liver  . TONSILLECTOMY AND ADENOIDECTOMY  1978  . TYMPANOSTOMY TUBE PLACEMENT    . wrist cyst Left   . WRIST SURGERY     left - cyst removed     SOCIAL HISTORY: Social History   Tobacco Use  . Smoking status: Never Smoker  . Smokeless tobacco: Never Used  Substance Use Topics  . Alcohol use: No    Alcohol/week: 0.0 oz  . Drug use: No    FAMILY HISTORY: Family History  Problem Relation Age of Onset  . Diabetes Mother   . Liver disease Mother        ESLD- unknown etiology  . Obesity Mother   . Diabetes Maternal Grandmother   . Diabetes Paternal Grandmother   . Breast cancer Paternal Grandmother   . Stroke Maternal Grandfather   . Hyperlipidemia Father     ROS: Review of Systems  Constitutional: Positive for malaise/fatigue and weight loss.  Cardiovascular: Negative for palpitations.  Gastrointestinal: Negative for nausea.  Endo/Heme/Allergies:       Negative hypoglycemia Negative hot/cold intolerance    PHYSICAL EXAM: Blood pressure 109/69, pulse 70, temperature 97.6 F (36.4 C), temperature source Oral, height 5' 9"  (1.753 m), weight (!) 315 lb (142.9 kg), SpO2 94 %. Body mass index is 46.52 kg/m. Physical Exam  Constitutional: She is oriented to person, place, and time. She appears well-developed and well-nourished.  Cardiovascular: Normal rate.  Pulmonary/Chest: Effort normal.  Musculoskeletal: Normal range of motion.  Neurological: She is oriented to person, place, and time.  Skin: Skin is warm and dry.  Psychiatric: She has a normal mood and affect. Her behavior is normal.  Vitals reviewed.   RECENT LABS AND TESTS: BMET    Component Value Date/Time   NA 139 05/23/2017 1249   K 4.1 05/23/2017 1249   CL 101 05/23/2017 1249   CO2 25 05/23/2017 1249   GLUCOSE 108 (H) 05/23/2017 1249   GLUCOSE 118 (H) 09/08/2015 1514   BUN 10 05/23/2017 1249   CREATININE 0.67 05/23/2017 1249   CREATININE 0.85 09/08/2015 1514   CALCIUM 9.2 05/23/2017 1249   GFRNONAA 108 05/23/2017 1249   GFRNONAA 85 09/08/2015 1514   GFRAA 125 05/23/2017 1249   GFRAA >89 09/08/2015 1514   Lab Results  Component Value  Date   HGBA1C 5.8 (H) 05/23/2017   HGBA1C 5.7 04/12/2017   HGBA1C 5.8 (H) 05/19/2015   HGBA1C 5.7 06/06/2013   Lab Results  Component Value Date   INSULIN 30.4 (H) 05/23/2017   CBC    Component Value Date/Time   WBC 7.2 05/23/2017 1249   WBC 8.3 08/27/2015 1633   RBC 4.66 05/23/2017 1249   RBC 4.27 08/27/2015 1633   HGB 14.7 05/23/2017 1249   HCT 43.3 05/23/2017 1249   PLT 213  04/05/2017 0947   MCV 93 05/23/2017 1249   MCH 31.5 05/23/2017 1249   MCH 31.1 08/27/2015 1633   MCHC 33.9 05/23/2017 1249   MCHC 34.3 08/27/2015 1633   RDW 13.6 05/23/2017 1249   LYMPHSABS 2.8 05/23/2017 1249   MONOABS 0.4 10/27/2013 1604   EOSABS 0.3 05/23/2017 1249   BASOSABS 0.0 05/23/2017 1249   Iron/TIBC/Ferritin/ %Sat    Component Value Date/Time   IRON 95 06/27/2013 1645   TIBC 371 06/27/2013 1645   FERRITIN 401 (H) 06/27/2013 1645   IRONPCTSAT 26 06/27/2013 1645   Lipid Panel     Component Value Date/Time   CHOL 245 (H) 05/23/2017 1249   TRIG 127 05/23/2017 1249   HDL 77 05/23/2017 1249   CHOLHDL 5.4 (H) 03/30/2017 1509   CHOLHDL 5.0 06/06/2013 1654   VLDL 27 06/06/2013 1654   LDLCALC 143 (H) 05/23/2017 1249   Hepatic Function Panel     Component Value Date/Time   PROT 5.7 (L) 05/23/2017 1249   ALBUMIN 3.7 05/23/2017 1249   AST 85 (H) 05/23/2017 1249   ALT 86 (H) 05/23/2017 1249   ALKPHOS 194 (H) 05/23/2017 1249   BILITOT 0.9 05/23/2017 1249   BILIDIR 0.2 04/07/2015 1349   IBILI 1.0 04/07/2015 1349      Component Value Date/Time   TSH 7.870 (H) 05/23/2017 1249   TSH 6.960 (H) 03/30/2017 1509   TSH 3.31 08/27/2015 1633    ASSESSMENT AND PLAN: Subclinical hypothyroidism  Prediabetes - Plan: metFORMIN (GLUCOPHAGE) 500 MG tablet  Class 3 severe obesity with serious comorbidity and body mass index (BMI) of 45.0 to 49.9 in adult, unspecified obesity type ([place])  PLAN:  Hypothyroidism Melanie [name] was informed of the importance of good thyroid control to help with weight  loss efforts. She was also informed that supertheraputic thyroid levels are dangerous and will not improve weight loss results. Melanie [name] agrees to continue taking levothyroxine and she agrees to follow up with our clinic in 2 weeks.  Pre-Diabetes Melanie [name] will continue to work on weight loss, exercise, and decreasing simple carbohydrates in her diet to help decrease the risk of diabetes. We dicussed metformin including benefits and risks. She was informed that eating too many simple carbohydrates or too many calories at one sitting increases the likelihood of GI side effects. Melanie [name] agrees to continue taking metformin 500 mg PO q AM #30 and we will refill for 1 month. Melanie [name] agrees to follow up with our clinic in 2 weeks as directed to monitor her progress.  We spent > than 50% of the 15 minute visit on the counseling as documented in the note.  Obesity Marchia is currently in the action stage of change. As such, her goal is to continue with weight loss efforts She has agreed to keep a food journal with 450-600 calories and 40 grams of protein at supper daily and follow the Category 3 plan or journal 1400-1600 calories and 90+ grams of protein daily Melanie [name] has been instructed to work up to a goal of 150 minutes of combined cardio and strengthening exercise per week for weight loss and overall health benefits. We discussed the following Behavioral Modification Strategies today: increasing lean protein intake, increasing vegetables, work on meal planning and easy cooking plans, better snacking choices, and planning for success   Melanie [name] has agreed to follow up with our clinic in 2 weeks. She was informed of the importance of frequent follow up visits to maximize her success with intensive lifestyle modifications for her  multiple health conditions.   OBESITY BEHAVIORAL INTERVENTION VISIT  Today's visit was # 5 out of 22.  Starting weight: 338 lbs Starting date: 05/23/17 Today's weight : 315 lbs Today's date:  07/24/2017 Total lbs lost to date: 23 (Patients must lose 7 lbs in the first 6 months to continue with counseling)   ASK: We discussed the diagnosis of obesity with Melanie [name] today and Melanie [name] agreed to give [place] permission to discuss obesity behavioral modification therapy today.  ASSESS: Melanie [name] has the diagnosis of obesity and her BMI today is 58.5 Melanie [name] is in the action stage of change   ADVISE: Melanie [name] was educated on the multiple health risks of obesity as well as the benefit of weight loss to improve her health. She was advised of the need for long term treatment and the importance of lifestyle modifications.  AGREE: Multiple dietary modification options and treatment options were discussed and  Melanie [name] agreed to the above obesity treatment plan.  I, Trixie Dredge, am acting as transcriptionist for Ilene Qua, MD  I have reviewed the above documentation for accuracy and completeness, and I agree with the above. - Ilene Qua, MD

## 2017-07-25 ENCOUNTER — Other Ambulatory Visit (INDEPENDENT_AMBULATORY_CARE_PROVIDER_SITE_OTHER): Payer: Self-pay | Admitting: Family Medicine

## 2017-07-25 DIAGNOSIS — R7303 Prediabetes: Secondary | ICD-10-CM

## 2017-07-31 ENCOUNTER — Other Ambulatory Visit (INDEPENDENT_AMBULATORY_CARE_PROVIDER_SITE_OTHER): Payer: Self-pay | Admitting: Family Medicine

## 2017-07-31 DIAGNOSIS — E559 Vitamin D deficiency, unspecified: Secondary | ICD-10-CM

## 2017-07-31 DIAGNOSIS — E039 Hypothyroidism, unspecified: Secondary | ICD-10-CM

## 2017-07-31 DIAGNOSIS — E038 Other specified hypothyroidism: Secondary | ICD-10-CM

## 2017-08-02 ENCOUNTER — Other Ambulatory Visit (INDEPENDENT_AMBULATORY_CARE_PROVIDER_SITE_OTHER): Payer: Self-pay | Admitting: Family Medicine

## 2017-08-02 DIAGNOSIS — E039 Hypothyroidism, unspecified: Secondary | ICD-10-CM

## 2017-08-02 DIAGNOSIS — E038 Other specified hypothyroidism: Secondary | ICD-10-CM

## 2017-08-06 ENCOUNTER — Other Ambulatory Visit: Payer: Self-pay | Admitting: Family Medicine

## 2017-08-06 DIAGNOSIS — F418 Other specified anxiety disorders: Secondary | ICD-10-CM

## 2017-08-06 DIAGNOSIS — F4323 Adjustment disorder with mixed anxiety and depressed mood: Secondary | ICD-10-CM

## 2017-08-08 NOTE — Telephone Encounter (Signed)
Please advise. Dgaddy, CMA 

## 2017-08-08 NOTE — Telephone Encounter (Signed)
Buspar refill Last OV:04/12/17 Last refill:04/12/17 60 tab/3 refill RKY:[id number] [facility]: [name] [address] [place] [phone] (Phone) [phone] (Fax)

## 2017-08-15 ENCOUNTER — Ambulatory Visit (INDEPENDENT_AMBULATORY_CARE_PROVIDER_SITE_OTHER): Payer: BC Managed Care – PPO | Admitting: Family Medicine

## 2017-08-15 VITALS — BP 120/73 | HR 67 | Temp 98.0°F | Ht 69.0 in | Wt 322.0 lb

## 2017-08-15 DIAGNOSIS — E039 Hypothyroidism, unspecified: Secondary | ICD-10-CM | POA: Diagnosis not present

## 2017-08-15 DIAGNOSIS — E559 Vitamin D deficiency, unspecified: Secondary | ICD-10-CM | POA: Diagnosis not present

## 2017-08-15 DIAGNOSIS — R7303 Prediabetes: Secondary | ICD-10-CM

## 2017-08-15 DIAGNOSIS — Z9189 Other specified personal risk factors, not elsewhere classified: Secondary | ICD-10-CM

## 2017-08-15 DIAGNOSIS — Z6841 Body Mass Index (BMI) 40.0 and over, adult: Secondary | ICD-10-CM

## 2017-08-15 DIAGNOSIS — E038 Other specified hypothyroidism: Secondary | ICD-10-CM

## 2017-08-15 MED ORDER — METFORMIN HCL 500 MG PO TABS: 500.0000 mg | ORAL_TABLET | Freq: Every day | ORAL | 0 refills | Status: DC

## 2017-08-15 MED ORDER — VITAMIN D (ERGOCALCIFEROL) 1.25 MG (50000 UNIT) PO CAPS: ORAL_CAPSULE | ORAL | 0 refills | Status: DC

## 2017-08-15 MED ORDER — LEVOTHYROXINE SODIUM 25 MCG PO TABS: ORAL_TABLET | ORAL | 0 refills | Status: DC

## 2017-08-15 NOTE — Progress Notes (Signed)
Office: [phone]  /  Fax: [phone]   HPI:   Chief Complaint: OBESITY Melanie [name] is here to discuss her progress with her obesity treatment plan. She is on the keep a food journal with 450 to 600 calories and 40 grams of protein at supper daily and the Category 3 plan or keep a food journal with 1400 to 1600 calories and 90+ grams of protein daily and is following her eating plan approximately 60 % of the time. She states she is exercising 0 minutes 0 times per week. Melanie [name] went away for a conference last week and struggled. She has struggled getting back on track since returning from the conference. Her weight is (!) (P) 322 lb (146.1 kg) today and has had a weight gain of 7 pounds over a period of 3 weeks since her last visit. She has lost 16 lbs since starting treatment with [place].  Vitamin D deficiency Melanie [name] has a diagnosis of vitamin D deficiency. She is currently taking vit D. Melanie [name] admits fatigue and denies nausea, vomiting or muscle weakness.  Pre-Diabetes Melanie [name] has a diagnosis of prediabetes based on her elevated Hgb A1c and was informed this puts her at greater risk of developing diabetes. She struggled with food choices last week when she was away for a conference. She is taking metformin currently and continues to work on diet and exercise to decrease risk of diabetes. She denies nausea or hypoglycemia.  At risk for diabetes Melanie [name] is at higher than average risk for developing diabetes due to her obesity and pre-diabetes. She currently denies polyuria or polydipsia.  Hypothyroidism Melanie [name] has a diagnosis of hypothyroidism. She is on levothyroxine. She denies hot or cold intolerance or palpitations, but does admit to ongoing fatigue.  ALLERGIES: Allergies  Allergen Reactions  . Lidocaine Other (See Comments)    Flu like sx's  . Relafen [Nabumetone] Other (See Comments)    migraine  . Celebrex [Celecoxib] Rash  . Sulfa Antibiotics Rash    MEDICATIONS: Current Outpatient  Medications on File Prior to Visit  Medication Sig Dispense Refill  . atorvastatin (LIPITOR) 20 MG tablet Take 1 tablet (20 mg total) by mouth daily. 90 tablet 3  . buPROPion (WELLBUTRIN) 75 MG tablet Take 2 tablets (150 mg total) by mouth 2 (two) times daily. 360 tablet 3  . busPIRone (BUSPAR) 15 MG tablet TAKE ONE TABLET BY MOUTH TWICE A DAY OR AS DIRECTED 60 tablet 2  . cetirizine (ZYRTEC) 10 MG tablet Take 10 mg by mouth daily.    . citalopram (CELEXA) 20 MG tablet TAKE ONE TABLET BY MOUTH DAILY 90 tablet 3  . fluticasone (FLONASE) 50 MCG/ACT nasal spray Place 2 sprays into both nostrils daily. 48 g 3  . furosemide (LASIX) 20 MG tablet Take 1 tablet (20 mg total) by mouth daily. 90 tablet 3  . LORazepam (ATIVAN) 1 MG tablet Take 1 tablet (1 mg total) by mouth 2 (two) times daily as needed for anxiety. 20 tablet 0  . montelukast (SINGULAIR) 10 MG tablet Take 1 tablet (10 mg total) by mouth at bedtime. 90 tablet 3  . Multiple Vitamin (MULTIVITAMIN) tablet Take 1 tablet by mouth daily.    . Omega-3 Fatty Acids (FISH OIL PO) Take by mouth daily.     Current Facility-Administered Medications on File Prior to Visit  Medication Dose Route Frequency Provider Last Rate Last Dose  . cefTRIAXone (ROCEPHIN) injection 1 g  1 g Intramuscular Once Rutherford Guys, MD  PAST MEDICAL HISTORY: Past Medical History:  Diagnosis Date  . Allergy   . Anemia   . Anxiety   . Back pain   . Constipation   . Depression   . Fatty liver   . GERD (gastroesophageal reflux disease)   . Hyperlipidemia   . Infertility, female   . Joint pain   . Kidney problem   . Membranous nephrosis   . Multiple food allergies   . Muscle pain   . Numbness and tingling in both hands   . Pre-diabetes   . Shortness of breath   . Sleep apnea   . Stress   . Swelling of both lower extremities     PAST SURGICAL HISTORY: Past Surgical History:  Procedure Laterality Date  . FINGER SURGERY Left     index finger  .  KNEE SURGERY Left   . LIVER BIOPSY N/A 05/15/2014   Procedure: LIVER BIOPSY;  Surgeon: Inda Castle, MD;  Location: [facility];  Service: Endoscopy;  Laterality: N/A;  ultrasound to mark the liver  . TONSILLECTOMY AND ADENOIDECTOMY  1978  . TYMPANOSTOMY TUBE PLACEMENT    . wrist cyst Left   . WRIST SURGERY     left - cyst removed    SOCIAL HISTORY: Social History   Tobacco Use  . Smoking status: Never Smoker  . Smokeless tobacco: Never Used  Substance Use Topics  . Alcohol use: No    Alcohol/week: 0.0 oz  . Drug use: No    FAMILY HISTORY: Family History  Problem Relation Age of Onset  . Diabetes Mother   . Liver disease Mother        ESLD- unknown etiology  . Obesity Mother   . Diabetes Maternal Grandmother   . Diabetes Paternal Grandmother   . Breast cancer Paternal Grandmother   . Stroke Maternal Grandfather   . Hyperlipidemia Father     ROS: Review of Systems  Constitutional: Positive for malaise/fatigue. Negative for weight loss.  Cardiovascular: Negative for palpitations.  Gastrointestinal: Negative for nausea and vomiting.  Genitourinary: Negative for frequency.  Musculoskeletal:       Negative for muscle weakness  Endo/Heme/Allergies: Negative for polydipsia.       Negative for hypoglycemia Negative for Heat or Cold Intolerance    PHYSICAL EXAM: Blood pressure 120/73, pulse 67, temperature 98 F (36.7 C), temperature source Oral, height (P) 5' 9"  (1.753 m), weight (!) (P) 322 lb (146.1 kg), SpO2 97 %. Body mass index is 47.55 kg/m (pended). Physical Exam  Constitutional: She is oriented to person, place, and time. She appears well-developed and well-nourished.  Cardiovascular: Normal rate.  Pulmonary/Chest: Effort normal.  Musculoskeletal: Normal range of motion.  Neurological: She is oriented to person, place, and time.  Skin: Skin is warm and dry.  Psychiatric: She has a normal mood and affect. Her behavior is normal.  Vitals  reviewed.   RECENT LABS AND TESTS: BMET    Component Value Date/Time   NA 139 05/23/2017 1249   K 4.1 05/23/2017 1249   CL 101 05/23/2017 1249   CO2 25 05/23/2017 1249   GLUCOSE 108 (H) 05/23/2017 1249   GLUCOSE 118 (H) 09/08/2015 1514   BUN 10 05/23/2017 1249   CREATININE 0.67 05/23/2017 1249   CREATININE 0.85 09/08/2015 1514   CALCIUM 9.2 05/23/2017 1249   GFRNONAA 108 05/23/2017 1249   GFRNONAA 85 09/08/2015 1514   GFRAA 125 05/23/2017 1249   GFRAA >89 09/08/2015 1514   Lab Results  Component  Value Date   HGBA1C 5.8 (H) 05/23/2017   HGBA1C 5.7 04/12/2017   HGBA1C 5.8 (H) 05/19/2015   HGBA1C 5.7 06/06/2013   Lab Results  Component Value Date   INSULIN 30.4 (H) 05/23/2017   CBC    Component Value Date/Time   WBC 7.2 05/23/2017 1249   WBC 8.3 08/27/2015 1633   RBC 4.66 05/23/2017 1249   RBC 4.27 08/27/2015 1633   HGB 14.7 05/23/2017 1249   HCT 43.3 05/23/2017 1249   PLT 213 04/05/2017 0947   MCV 93 05/23/2017 1249   MCH 31.5 05/23/2017 1249   MCH 31.1 08/27/2015 1633   MCHC 33.9 05/23/2017 1249   MCHC 34.3 08/27/2015 1633   RDW 13.6 05/23/2017 1249   LYMPHSABS 2.8 05/23/2017 1249   MONOABS 0.4 10/27/2013 1604   EOSABS 0.3 05/23/2017 1249   BASOSABS 0.0 05/23/2017 1249   Iron/TIBC/Ferritin/ %Sat    Component Value Date/Time   IRON 95 06/27/2013 1645   TIBC 371 06/27/2013 1645   FERRITIN 401 (H) 06/27/2013 1645   IRONPCTSAT 26 06/27/2013 1645   Lipid Panel     Component Value Date/Time   CHOL 245 (H) 05/23/2017 1249   TRIG 127 05/23/2017 1249   HDL 77 05/23/2017 1249   CHOLHDL 5.4 (H) 03/30/2017 1509   CHOLHDL 5.0 06/06/2013 1654   VLDL 27 06/06/2013 1654   LDLCALC 143 (H) 05/23/2017 1249   Hepatic Function Panel     Component Value Date/Time   PROT 5.7 (L) 05/23/2017 1249   ALBUMIN 3.7 05/23/2017 1249   AST 85 (H) 05/23/2017 1249   ALT 86 (H) 05/23/2017 1249   ALKPHOS 194 (H) 05/23/2017 1249   BILITOT 0.9 05/23/2017 1249   BILIDIR 0.2  04/07/2015 1349   IBILI 1.0 04/07/2015 1349      Component Value Date/Time   TSH 7.870 (H) 05/23/2017 1249   TSH 6.960 (H) 03/30/2017 1509   TSH 3.31 08/27/2015 1633   Results for GEOVANA, GEBEL (MRN [id number]) as of 08/15/2017 15:47  Ref. Range 05/23/2017 12:49  Vitamin D, 25-Hydroxy Latest Ref Range: 30.0 - 100.0 ng/mL 28.6 (L)   ASSESSMENT AND PLAN: Prediabetes - Plan: metFORMIN (GLUCOPHAGE) 500 MG tablet  Vitamin D deficiency - Plan: Vitamin D, Ergocalciferol, (DRISDOL) 50000 units CAPS capsule  Subclinical hypothyroidism - Plan: levothyroxine (SYNTHROID, LEVOTHROID) 25 MCG tablet  At risk for diabetes mellitus  Class 3 severe obesity with serious comorbidity and body mass index (BMI) of 45.0 to 49.9 in adult, unspecified obesity type (Melanie [name])  PLAN:  Vitamin D Deficiency Melanie [name] was informed that low vitamin D levels contributes to fatigue and are associated with obesity, breast, and colon cancer. She agrees to continue to take prescription Vit D @50 ,000 IU every week #4 with no refills and will follow up for routine testing of vitamin D, at least 2-3 times per year. She was informed of the risk of over-replacement of vitamin D and agrees to not increase her dose unless she discusses this with [place] first. Melanie [name] agrees to follow up as directed.  Pre-Diabetes Melanie [name] will continue to work on weight loss, exercise, and decreasing simple carbohydrates in her diet to help decrease the risk of diabetes. We dicussed metformin including benefits and risks. She was informed that eating too many simple carbohydrates or too many calories at one sitting increases the likelihood of GI side effects. Melanie [name] requested metformin for now and a prescription was written today for 1 month refill. Melanie [name] agreed to follow up with [place] as  directed to monitor her progress.  Diabetes risk counseling Melanie [name] was given extended (15 minutes) diabetes prevention counseling today. She is 42 y.o. female and has risk factors for  diabetes including obesity and pre-diabetes. We discussed intensive lifestyle modifications today with an emphasis on weight loss as well as increasing exercise and decreasing simple carbohydrates in her diet.  Hypothyroidism Melanie [name] was informed of the importance of good thyroid control to help with weight loss efforts. She was also informed that supertheraputic thyroid levels are dangerous and will not improve weight loss results. Melanie [name] agrees to continue levothyroxine 25 mcg qAM before breakfast #30 with no refills and follow up as directed.  Obesity Melanie [name] is currently in the action stage of change. As such, her goal is to continue with weight loss efforts She has agreed to follow the Category 3 plan Melanie [name] has been instructed to work up to a goal of 150 minutes of combined cardio and strengthening exercise per week for weight loss and overall health benefits. We discussed the following Behavioral Modification Strategies today: planning for success, increasing lean protein intake, increasing vegetables and work on meal planning and easy cooking plans  Melanie [name] has agreed to follow up with our clinic in 2 weeks. She was informed of the importance of frequent follow up visits to maximize her success with intensive lifestyle modifications for her multiple health conditions.   OBESITY BEHAVIORAL INTERVENTION VISIT  Today's visit was # 6 out of 22.  Starting weight: 338 lbs Starting date: 05/23/17 Today's weight : 322 lbs  Today's date: 08/15/2017 Total lbs lost to date: 16 (Patients must lose 7 lbs in the first 6 months to continue with counseling)   ASK: We discussed the diagnosis of obesity with Melanie [name] today and Melanie [name] agreed to give [place] permission to discuss obesity behavioral modification therapy today.  ASSESS: Melanie [name] has the diagnosis of obesity and her BMI today is 47.53 Eriko is in the action stage of change   ADVISE: Khloey was educated on the multiple health risks of obesity as well as  the benefit of weight loss to improve her health. She was advised of the need for long term treatment and the importance of lifestyle modifications.  AGREE: Multiple dietary modification options and treatment options were discussed and  Ernie agreed to the above obesity treatment plan.  I, Doreene Nest, am acting as transcriptionist for Eber Jones, MD  I have reviewed the above documentation for accuracy and completeness, and I agree with the above. - Ilene Qua, MD

## 2017-08-19 ENCOUNTER — Other Ambulatory Visit (INDEPENDENT_AMBULATORY_CARE_PROVIDER_SITE_OTHER): Payer: Self-pay | Admitting: Family Medicine

## 2017-08-19 DIAGNOSIS — R7303 Prediabetes: Secondary | ICD-10-CM

## 2017-08-24 ENCOUNTER — Other Ambulatory Visit (INDEPENDENT_AMBULATORY_CARE_PROVIDER_SITE_OTHER): Payer: Self-pay | Admitting: Family Medicine

## 2017-08-24 DIAGNOSIS — E559 Vitamin D deficiency, unspecified: Secondary | ICD-10-CM

## 2017-08-26 ENCOUNTER — Other Ambulatory Visit (INDEPENDENT_AMBULATORY_CARE_PROVIDER_SITE_OTHER): Payer: Self-pay | Admitting: Family Medicine

## 2017-08-26 DIAGNOSIS — E038 Other specified hypothyroidism: Secondary | ICD-10-CM

## 2017-08-26 DIAGNOSIS — E039 Hypothyroidism, unspecified: Secondary | ICD-10-CM

## 2017-09-05 ENCOUNTER — Ambulatory Visit (INDEPENDENT_AMBULATORY_CARE_PROVIDER_SITE_OTHER): Payer: BC Managed Care – PPO | Admitting: Family Medicine

## 2017-09-05 VITALS — BP 123/77 | HR 69 | Temp 97.9°F | Ht 69.0 in | Wt 318.0 lb

## 2017-09-05 DIAGNOSIS — K760 Fatty (change of) liver, not elsewhere classified: Secondary | ICD-10-CM

## 2017-09-05 DIAGNOSIS — E559 Vitamin D deficiency, unspecified: Secondary | ICD-10-CM | POA: Diagnosis not present

## 2017-09-05 DIAGNOSIS — Z9189 Other specified personal risk factors, not elsewhere classified: Secondary | ICD-10-CM | POA: Diagnosis not present

## 2017-09-05 DIAGNOSIS — R7303 Prediabetes: Secondary | ICD-10-CM

## 2017-09-05 DIAGNOSIS — Z6841 Body Mass Index (BMI) 40.0 and over, adult: Secondary | ICD-10-CM

## 2017-09-05 NOTE — Progress Notes (Signed)
Office: [phone]  /  Fax: [phone]   HPI:   Chief Complaint: OBESITY Melanie [name] is here to discuss her progress with her obesity treatment plan. She is on the Category 3 plan and is following her eating plan approximately 65 % of the time. She states she is exercising 0 minutes 0 times per week. Melanie [name] is doing well with the structured plan. She has significant family stress with her son going to inpatient rehab program and her mother with meningitis (lives in [place], so patient may go there to assist her father in caring for her). Her weight is (!) 318 lb (144.2 kg) today and has had a weight loss of 4 pounds over a period of 3 weeks since her last visit. She has lost 20 lbs since starting treatment with [place].  Vitamin D deficiency Rhythm has a diagnosis of vitamin D deficiency. She is currently taking vit D. Jerrilyn admits fatigue and denies nausea, vomiting or muscle weakness.  Pre-Diabetes Byrd has a diagnosis of prediabetes based on her elevated Hgb A1c and was informed this puts her at greater risk of developing diabetes. She admits carb cravings and denies any GI symptoms with metformin. Avira continues to work on diet and exercise to decrease risk of diabetes. She denies any hypoglycemic events.  At risk for diabetes Nalleli is at higher than average risk for developing diabetes due to her obesity and pre-diabetes. She currently denies polyuria or polydipsia.  Fatty Liver Disease Emma-Lee has a dx of fatty liver disease with elevated LFTs previously. Her BMI is over 40. She denies abdominal pain or jaundice. She denies excessive alcohol intake.  ALLERGIES: Allergies  Allergen Reactions  . Lidocaine Other (See Comments)    Flu like sx's  . Relafen [Nabumetone] Other (See Comments)    migraine  . Celebrex [Celecoxib] Rash  . Sulfa Antibiotics Rash    MEDICATIONS: Current Outpatient Medications on File Prior to Visit  Medication Sig Dispense Refill  . atorvastatin (LIPITOR) 20 MG tablet  Take 1 tablet (20 mg total) by mouth daily. 90 tablet 3  . buPROPion (WELLBUTRIN) 75 MG tablet Take 2 tablets (150 mg total) by mouth 2 (two) times daily. 360 tablet 3  . busPIRone (BUSPAR) 15 MG tablet TAKE ONE TABLET BY MOUTH TWICE A DAY OR AS DIRECTED 60 tablet 2  . cetirizine (ZYRTEC) 10 MG tablet Take 10 mg by mouth daily.    . citalopram (CELEXA) 20 MG tablet TAKE ONE TABLET BY MOUTH DAILY 90 tablet 3  . fluticasone (FLONASE) 50 MCG/ACT nasal spray Place 2 sprays into both nostrils daily. 48 g 3  . furosemide (LASIX) 20 MG tablet Take 1 tablet (20 mg total) by mouth daily. 90 tablet 3  . levothyroxine (SYNTHROID, LEVOTHROID) 25 MCG tablet TAKE ONE TABLET BY MOUTH DAILY BEFORE BREAKFAST 30 tablet 0  . LORazepam (ATIVAN) 1 MG tablet Take 1 tablet (1 mg total) by mouth 2 (two) times daily as needed for anxiety. 20 tablet 0  . metFORMIN (GLUCOPHAGE) 500 MG tablet Take 1 tablet (500 mg total) by mouth daily with breakfast. 30 tablet 0  . montelukast (SINGULAIR) 10 MG tablet Take 1 tablet (10 mg total) by mouth at bedtime. 90 tablet 3  . Multiple Vitamin (MULTIVITAMIN) tablet Take 1 tablet by mouth daily.    . Omega-3 Fatty Acids (FISH OIL PO) Take by mouth daily.    . Vitamin D, Ergocalciferol, (DRISDOL) 50000 units CAPS capsule TAKE ONE CAPSULE BY MOUTH EVERY SEVEN DAYS 4  capsule 0   Current Facility-Administered Medications on File Prior to Visit  Medication Dose Route Frequency Provider Last Rate Last Dose  . cefTRIAXone (ROCEPHIN) injection 1 g  1 g Intramuscular Once Rutherford Guys, MD        PAST MEDICAL HISTORY: Past Medical History:  Diagnosis Date  . Allergy   . Anemia   . Anxiety   . Back pain   . Constipation   . Depression   . Fatty liver   . GERD (gastroesophageal reflux disease)   . Hyperlipidemia   . Infertility, female   . Joint pain   . Kidney problem   . Membranous nephrosis   . Multiple food allergies   . Muscle pain   . Numbness and tingling in both hands    . Pre-diabetes   . Shortness of breath   . Sleep apnea   . Stress   . Swelling of both lower extremities     PAST SURGICAL HISTORY: Past Surgical History:  Procedure Laterality Date  . FINGER SURGERY Left     index finger  . KNEE SURGERY Left   . LIVER BIOPSY N/A 05/15/2014   Procedure: LIVER BIOPSY;  Surgeon: Inda Castle, MD;  Location: [facility];  Service: Endoscopy;  Laterality: N/A;  ultrasound to mark the liver  . TONSILLECTOMY AND ADENOIDECTOMY  1978  . TYMPANOSTOMY TUBE PLACEMENT    . wrist cyst Left   . WRIST SURGERY     left - cyst removed    SOCIAL HISTORY: Social History   Tobacco Use  . Smoking status: Never Smoker  . Smokeless tobacco: Never Used  Substance Use Topics  . Alcohol use: No    Alcohol/week: 0.0 oz  . Drug use: No    FAMILY HISTORY: Family History  Problem Relation Age of Onset  . Diabetes Mother   . Liver disease Mother        ESLD- unknown etiology  . Obesity Mother   . Diabetes Maternal Grandmother   . Diabetes Paternal Grandmother   . Breast cancer Paternal Grandmother   . Stroke Maternal Grandfather   . Hyperlipidemia Father     ROS: Review of Systems  Constitutional: Positive for malaise/fatigue and weight loss.  Gastrointestinal: Negative for abdominal pain, diarrhea, nausea and vomiting.  Genitourinary: Negative for frequency.  Musculoskeletal:       Negative for muscle weakness  Skin:       Negative for jaundice  Endo/Heme/Allergies: Negative for polydipsia.       Positive for carb cravings Negative for hypoglycemia    PHYSICAL EXAM: Blood pressure 123/77, pulse 69, temperature 97.9 F (36.6 C), temperature source Oral, height 5' 9"  (1.753 m), weight (!) 318 lb (144.2 kg), SpO2 95 %. Body mass index is 46.96 kg/m. Physical Exam  Constitutional: She is oriented to person, place, and time. She appears well-developed and well-nourished.  Cardiovascular: Normal rate.  Pulmonary/Chest: Effort normal.    Musculoskeletal: Normal range of motion.  Neurological: She is oriented to person, place, and time.  Skin: Skin is warm and dry.  Psychiatric: She has a normal mood and affect. Her behavior is normal.  Vitals reviewed.   RECENT LABS AND TESTS: BMET    Component Value Date/Time   NA 139 05/23/2017 1249   K 4.1 05/23/2017 1249   CL 101 05/23/2017 1249   CO2 25 05/23/2017 1249   GLUCOSE 108 (H) 05/23/2017 1249   GLUCOSE 118 (H) 09/08/2015 1514   BUN 10 05/23/2017  1249   CREATININE 0.67 05/23/2017 1249   CREATININE 0.85 09/08/2015 1514   CALCIUM 9.2 05/23/2017 1249   GFRNONAA 108 05/23/2017 1249   GFRNONAA 85 09/08/2015 1514   GFRAA 125 05/23/2017 1249   GFRAA >89 09/08/2015 1514   Lab Results  Component Value Date   HGBA1C 5.8 (H) 05/23/2017   HGBA1C 5.7 04/12/2017   HGBA1C 5.8 (H) 05/19/2015   HGBA1C 5.7 06/06/2013   Lab Results  Component Value Date   INSULIN 30.4 (H) 05/23/2017   CBC    Component Value Date/Time   WBC 7.2 05/23/2017 1249   WBC 8.3 08/27/2015 1633   RBC 4.66 05/23/2017 1249   RBC 4.27 08/27/2015 1633   HGB 14.7 05/23/2017 1249   HCT 43.3 05/23/2017 1249   PLT 213 04/05/2017 0947   MCV 93 05/23/2017 1249   MCH 31.5 05/23/2017 1249   MCH 31.1 08/27/2015 1633   MCHC 33.9 05/23/2017 1249   MCHC 34.3 08/27/2015 1633   RDW 13.6 05/23/2017 1249   LYMPHSABS 2.8 05/23/2017 1249   MONOABS 0.4 10/27/2013 1604   EOSABS 0.3 05/23/2017 1249   BASOSABS 0.0 05/23/2017 1249   Iron/TIBC/Ferritin/ %Sat    Component Value Date/Time   IRON 95 06/27/2013 1645   TIBC 371 06/27/2013 1645   FERRITIN 401 (H) 06/27/2013 1645   IRONPCTSAT 26 06/27/2013 1645   Lipid Panel     Component Value Date/Time   CHOL 245 (H) 05/23/2017 1249   TRIG 127 05/23/2017 1249   HDL 77 05/23/2017 1249   CHOLHDL 5.4 (H) 03/30/2017 1509   CHOLHDL 5.0 06/06/2013 1654   VLDL 27 06/06/2013 1654   LDLCALC 143 (H) 05/23/2017 1249   Hepatic Function Panel     Component Value  Date/Time   PROT 5.7 (L) 05/23/2017 1249   ALBUMIN 3.7 05/23/2017 1249   AST 85 (H) 05/23/2017 1249   ALT 86 (H) 05/23/2017 1249   ALKPHOS 194 (H) 05/23/2017 1249   BILITOT 0.9 05/23/2017 1249   BILIDIR 0.2 04/07/2015 1349   IBILI 1.0 04/07/2015 1349      Component Value Date/Time   TSH 7.870 (H) 05/23/2017 1249   TSH 6.960 (H) 03/30/2017 1509   TSH 3.31 08/27/2015 1633   Results for ANTORIA, LANZA (MRN [id number]) as of 09/05/2017 09:37  Ref. Range 05/23/2017 12:49  Vitamin D, 25-Hydroxy Latest Ref Range: 30.0 - 100.0 ng/mL 28.6 (L)   ASSESSMENT AND PLAN: Prediabetes - Plan: Comprehensive metabolic panel, Hemoglobin A1c, Insulin, random, Lipid Panel With LDL/HDL Ratio, T3, T4, free, TSH  Vitamin D deficiency - Plan: VITAMIN D 25 Hydroxy (Vit-D Deficiency, Fractures)  Fatty liver disease, nonalcoholic - Plan: Comprehensive metabolic panel, Lipid Panel With LDL/HDL Ratio  At risk for diabetes mellitus  Class 3 severe obesity with serious comorbidity and body mass index (BMI) of 45.0 to 49.9 in adult, unspecified obesity type (HCC)  PLAN:  Vitamin D Deficiency Whisper was informed that low vitamin D levels contributes to fatigue and are associated with obesity, breast, and colon cancer. She agrees to continue to take prescription Vit D @50 ,000 IU every week. We will check vitamin D level today and she will follow up for routine testing of vitamin D, at least 2-3 times per year. She was informed of the risk of over-replacement of vitamin D and agrees to not increase her dose unless she discusses this with [place] first.  Pre-Diabetes Mekia will continue to work on weight loss, exercise, and decreasing simple carbohydrates in her diet  to help decrease the risk of diabetes. We dicussed metformin including benefits and risks. She was informed that eating too many simple carbohydrates or too many calories at one sitting increases the likelihood of GI side effects. Devina will continue metformin for  now and a prescription was not written today. We will check Hgb A1c and insulin today and Yovana agreed to follow up with [place] as directed to monitor her progress.  Diabetes risk counseling Treniya was given extended (15 minutes) diabetes prevention counseling today. She is 42 y.o. female and has risk factors for diabetes including obesity and pre-diabetes. We discussed intensive lifestyle modifications today with an emphasis on weight loss as well as increasing exercise and decreasing simple carbohydrates in her diet.  Fatty Liver Disease We discussed the diagnosis of fatty liver disease today and how this condition is obesity related. Dua was educated on her risk of developing NASH or even liver failure and the only proven treatment for fatty liver disease was weight loss. Latunya agreed to continue with her weight loss efforts with healthier diet and exercise as an essential part of her treatment plan. We will check CMP today.  Obesity Paisleigh is currently in the action stage of change. As such, her goal is to continue with weight loss efforts She has agreed to follow the Category 3 plan Sofya has been instructed to work up to a goal of 150 minutes of combined cardio and strengthening exercise per week or add walking 15 minutes 2 to 3 times per week for weight loss and overall health benefits. We discussed the following Behavioral Modification Strategies today: better snacking choices, planning for success, increasing vegetables and work on meal planning and easy cooking plans  Alizon has agreed to follow up with our clinic in 2 weeks. She was informed of the importance of frequent follow up visits to maximize her success with intensive lifestyle modifications for her multiple health conditions.   OBESITY BEHAVIORAL INTERVENTION VISIT  Today's visit was # 7 out of 22.  Starting weight: 338 lbs Starting date: 05/23/17 Today's weight : 318 lbs  Today's date: 09/05/2017 Total lbs lost to date: 20 (Patients  must lose 7 lbs in the first 6 months to continue with counseling)   ASK: We discussed the diagnosis of obesity with [name] today and Keiosha agreed to give [place] permission to discuss obesity behavioral modification therapy today.  ASSESS: Nicey has the diagnosis of obesity and her BMI today is 46.94 Dalyn is in the action stage of change   ADVISE: Rokhaya was educated on the multiple health risks of obesity as well as the benefit of weight loss to improve her health. She was advised of the need for long term treatment and the importance of lifestyle modifications.  AGREE: Multiple dietary modification options and treatment options were discussed and  Ottilia agreed to the above obesity treatment plan.  I, Doreene Nest, am acting as transcriptionist for Eber Jones, MD  I have reviewed the above documentation for accuracy and completeness, and I agree with the above. - Ilene Qua, MD

## 2017-09-06 LAB — VITAMIN D 25 HYDROXY (VIT D DEFICIENCY, FRACTURES): Vit D, 25-Hydroxy: 30.8 ng/mL (ref 30.0–100.0)

## 2017-09-06 LAB — LIPID PANEL WITH LDL/HDL RATIO
Cholesterol, Total: 242 mg/dL — ABNORMAL HIGH (ref 100–199)
HDL: 72 mg/dL (ref >39)
LDL Calculated: 138 mg/dL — ABNORMAL HIGH (ref 0–99)
LDl/HDL Ratio: 1.9 ratio (ref 0.0–3.2)
Triglycerides: 159 mg/dL — ABNORMAL HIGH (ref 0–149)
VLDL Cholesterol Cal: 32 mg/dL (ref 5–40)

## 2017-09-06 LAB — COMPREHENSIVE METABOLIC PANEL
ALBUMIN: 3.4 g/dL — AB (ref 3.5–5.5)
ALT: 212 IU/L — ABNORMAL HIGH (ref 0–32)
AST: 142 IU/L — ABNORMAL HIGH (ref 0–40)
Albumin/Globulin Ratio: 1.5 (ref 1.2–2.2)
Alkaline Phosphatase: 277 IU/L — ABNORMAL HIGH (ref 39–117)
BILIRUBIN TOTAL: 1.3 mg/dL — AB (ref 0.0–1.2)
BUN / CREAT RATIO: 20 (ref 9–23)
BUN: 14 mg/dL (ref 6–24)
CO2: 24 mmol/L (ref 20–29)
Calcium: 9.1 mg/dL (ref 8.7–10.2)
Chloride: 103 mmol/L (ref 96–106)
Creatinine, Ser: 0.71 mg/dL (ref 0.57–1.00)
GFR calc non Af Amer: 105 mL/min/{1.73_m2} (ref >59)
GFR, EST AFRICAN AMERICAN: 121 mL/min/{1.73_m2} (ref >59)
GLUCOSE: 117 mg/dL — AB (ref 65–99)
Globulin, Total: 2.2 g/dL (ref 1.5–4.5)
Potassium: 4.2 mmol/L (ref 3.5–5.2)
Sodium: 141 mmol/L (ref 134–144)
TOTAL PROTEIN: 5.6 g/dL — AB (ref 6.0–8.5)

## 2017-09-06 LAB — HEMOGLOBIN A1C
ESTIMATED AVERAGE GLUCOSE: 97 mg/dL
HEMOGLOBIN A1C: 5 % (ref 4.8–5.6)

## 2017-09-06 LAB — T3: T3, Total: 117 ng/dL (ref 71–180)

## 2017-09-06 LAB — T4, FREE: Free T4: 1.02 ng/dL (ref 0.82–1.77)

## 2017-09-06 LAB — TSH: TSH: 8.71 u[IU]/mL — ABNORMAL HIGH (ref 0.450–4.500)

## 2017-09-06 LAB — INSULIN, RANDOM: INSULIN: 24.3 u[IU]/mL (ref 2.6–24.9)

## 2017-09-14 ENCOUNTER — Other Ambulatory Visit (INDEPENDENT_AMBULATORY_CARE_PROVIDER_SITE_OTHER): Payer: Self-pay | Admitting: Family Medicine

## 2017-09-14 DIAGNOSIS — E559 Vitamin D deficiency, unspecified: Secondary | ICD-10-CM

## 2017-09-16 ENCOUNTER — Other Ambulatory Visit (INDEPENDENT_AMBULATORY_CARE_PROVIDER_SITE_OTHER): Payer: Self-pay | Admitting: Family Medicine

## 2017-09-16 DIAGNOSIS — R7303 Prediabetes: Secondary | ICD-10-CM

## 2017-09-24 ENCOUNTER — Ambulatory Visit (INDEPENDENT_AMBULATORY_CARE_PROVIDER_SITE_OTHER): Payer: BC Managed Care – PPO | Admitting: Family Medicine

## 2017-09-24 VITALS — BP 125/63 | HR 57 | Temp 98.0°F | Ht 69.0 in | Wt 323.0 lb

## 2017-09-24 DIAGNOSIS — K7581 Nonalcoholic steatohepatitis (NASH): Secondary | ICD-10-CM | POA: Diagnosis not present

## 2017-09-24 DIAGNOSIS — R7303 Prediabetes: Secondary | ICD-10-CM

## 2017-09-24 DIAGNOSIS — Z6841 Body Mass Index (BMI) 40.0 and over, adult: Secondary | ICD-10-CM | POA: Diagnosis not present

## 2017-09-24 DIAGNOSIS — E038 Other specified hypothyroidism: Secondary | ICD-10-CM

## 2017-09-24 DIAGNOSIS — R600 Localized edema: Secondary | ICD-10-CM

## 2017-09-24 DIAGNOSIS — E559 Vitamin D deficiency, unspecified: Secondary | ICD-10-CM | POA: Diagnosis not present

## 2017-09-24 DIAGNOSIS — Z9189 Other specified personal risk factors, not elsewhere classified: Secondary | ICD-10-CM

## 2017-09-24 MED ORDER — VITAMIN D (ERGOCALCIFEROL) 1.25 MG (50000 UNIT) PO CAPS: ORAL_CAPSULE | ORAL | 0 refills | Status: DC

## 2017-09-24 MED ORDER — LEVOTHYROXINE SODIUM 50 MCG PO TABS: 50.0000 ug | ORAL_TABLET | Freq: Every day | ORAL | 0 refills | Status: DC

## 2017-09-24 MED ORDER — METFORMIN HCL 500 MG PO TABS: 500.0000 mg | ORAL_TABLET | Freq: Every day | ORAL | 0 refills | Status: DC

## 2017-09-24 MED ORDER — FUROSEMIDE 20 MG PO TABS: 20.0000 mg | ORAL_TABLET | Freq: Every day | ORAL | 0 refills | Status: DC

## 2017-09-25 NOTE — Progress Notes (Signed)
Office: [phone]  /  Fax: [phone]   HPI:   Chief Complaint: OBESITY Melanie [name] is here to discuss her progress with her obesity treatment plan. She is on the Category 3 plan and is following her eating plan approximately 50 % of the time. She states she is exercising 0 minutes 0 times per week. Melanie [name] struggled with following the plan secondary to not having food in the house. She is having significant family stress with son in residential psychiatric facility. Her weight is (!) 323 lb (146.5 kg) today and has had a weight gain of 5 pounds over a period of 3 weeks since her last visit. She has lost 15 lbs since starting treatment with [place].  Vitamin D deficiency Melanie [name] has a diagnosis of vitamin D deficiency. She is currently taking prescription vit D. Melanie [name] admits fatigue and denies nausea, vomiting or muscle weakness.  Hypothyroidism Melanie [name] has a diagnosis of hypothyroidism. She is on levothyroxine. She denies hot or cold intolerance or palpitations, but does admit to ongoing fatigue.  Pre-Diabetes Melanie [name] has a diagnosis of prediabetes based on her elevated Hgb A1c and was informed this puts her at greater risk of developing diabetes. She is taking metformin currently and denies any GI issues with metformin. She continues to work on diet and exercise to decrease risk of diabetes. She denies carb cravings or hypoglycemia.  At risk for diabetes Melanie [name] is at higher than average risk for developing diabetes due to her obesity and pre-diabetes. She currently denies polyuria or polydipsia.  NASH (non alcoholic steatohepatitis) Melanie [name] has a dx of NASH with significant elevation in LFT's. Her BMI is over 40. She denies abdominal pain.   Leg Edema Melanie [name] has leg edema consistent with baseline. She has no erythema or asymmetry.  ALLERGIES: Allergies  Allergen Reactions  . Lidocaine Other (See Comments)    Flu like sx's  . Relafen [Nabumetone] Other (See Comments)    migraine  . Celebrex [Celecoxib]  Rash  . Sulfa Antibiotics Rash    MEDICATIONS: Current Outpatient Medications on File Prior to Visit  Medication Sig Dispense Refill  . atorvastatin (LIPITOR) 20 MG tablet Take 1 tablet (20 mg total) by mouth daily. 90 tablet 3  . buPROPion (WELLBUTRIN) 75 MG tablet Take 2 tablets (150 mg total) by mouth 2 (two) times daily. 360 tablet 3  . busPIRone (BUSPAR) 15 MG tablet TAKE ONE TABLET BY MOUTH TWICE A DAY OR AS DIRECTED 60 tablet 2  . cetirizine (ZYRTEC) 10 MG tablet Take 10 mg by mouth daily.    . citalopram (CELEXA) 20 MG tablet TAKE ONE TABLET BY MOUTH DAILY 90 tablet 3  . fluticasone (FLONASE) 50 MCG/ACT nasal spray Place 2 sprays into both nostrils daily. 48 g 3  . LORazepam (ATIVAN) 1 MG tablet Take 1 tablet (1 mg total) by mouth 2 (two) times daily as needed for anxiety. 20 tablet 0  . montelukast (SINGULAIR) 10 MG tablet Take 1 tablet (10 mg total) by mouth at bedtime. 90 tablet 3  . Multiple Vitamin (MULTIVITAMIN) tablet Take 1 tablet by mouth daily.    . Omega-3 Fatty Acids (FISH OIL PO) Take by mouth daily.     Current Facility-Administered Medications on File Prior to Visit  Medication Dose Route Frequency Provider Last Rate Last Dose  . cefTRIAXone (ROCEPHIN) injection 1 g  1 g Intramuscular Once Rutherford Guys, MD        PAST MEDICAL HISTORY: Past Medical History:  Diagnosis Date  . Allergy   .  Anemia   . Anxiety   . Back pain   . Constipation   . Depression   . Fatty liver   . GERD (gastroesophageal reflux disease)   . Hyperlipidemia   . Infertility, female   . Joint pain   . Kidney problem   . Membranous nephrosis   . Multiple food allergies   . Muscle pain   . Numbness and tingling in both hands   . Pre-diabetes   . Shortness of breath   . Sleep apnea   . Stress   . Swelling of both lower extremities     PAST SURGICAL HISTORY: Past Surgical History:  Procedure Laterality Date  . FINGER SURGERY Left     index finger  . KNEE SURGERY Left   .  LIVER BIOPSY N/A 05/15/2014   Procedure: LIVER BIOPSY;  Surgeon: Inda Castle, MD;  Location: [facility];  Service: Endoscopy;  Laterality: N/A;  ultrasound to mark the liver  . TONSILLECTOMY AND ADENOIDECTOMY  1978  . TYMPANOSTOMY TUBE PLACEMENT    . wrist cyst Left   . WRIST SURGERY     left - cyst removed    SOCIAL HISTORY: Social History   Tobacco Use  . Smoking status: Never Smoker  . Smokeless tobacco: Never Used  Substance Use Topics  . Alcohol use: No    Alcohol/week: 0.0 oz  . Drug use: No    FAMILY HISTORY: Family History  Problem Relation Age of Onset  . Diabetes Mother   . Liver disease Mother        ESLD- unknown etiology  . Obesity Mother   . Diabetes Maternal Grandmother   . Diabetes Paternal Grandmother   . Breast cancer Paternal Grandmother   . Stroke Maternal Grandfather   . Hyperlipidemia Father     ROS: Review of Systems  Constitutional: Positive for malaise/fatigue. Negative for weight loss.  Cardiovascular: Negative for palpitations.  Gastrointestinal: Negative for nausea and vomiting.  Genitourinary: Negative for frequency.  Musculoskeletal:       Negative for muscle weakness  Endo/Heme/Allergies: Negative for polydipsia.       Negative for heat or cold intolerance Negative for carb cravings Negative for hypoglycemia    PHYSICAL EXAM: Blood pressure 125/63, pulse (!) 57, temperature 98 F (36.7 C), temperature source Oral, height 5' 9"  (1.753 m), weight (!) 323 lb (146.5 kg), SpO2 98 %. Body mass index is 47.7 kg/m. Physical Exam  Constitutional: She is oriented to person, place, and time. She appears well-developed and well-nourished.  Cardiovascular: Normal rate.  Pulmonary/Chest: Effort normal.  Musculoskeletal: Normal range of motion. She exhibits edema.  Negative for asymmetry  Neurological: She is oriented to person, place, and time.  Skin: Skin is warm and dry. No erythema.  Psychiatric: She has a normal mood and affect.  Her behavior is normal.  Vitals reviewed.   RECENT LABS AND TESTS: BMET    Component Value Date/Time   NA 141 09/05/2017 0929   K 4.2 09/05/2017 0929   CL 103 09/05/2017 0929   CO2 24 09/05/2017 0929   GLUCOSE 117 (H) 09/05/2017 0929   GLUCOSE 118 (H) 09/08/2015 1514   BUN 14 09/05/2017 0929   CREATININE 0.71 09/05/2017 0929   CREATININE 0.85 09/08/2015 1514   CALCIUM 9.1 09/05/2017 0929   GFRNONAA 105 09/05/2017 0929   GFRNONAA 85 09/08/2015 1514   GFRAA 121 09/05/2017 0929   GFRAA >89 09/08/2015 1514   Lab Results  Component Value Date  HGBA1C 5.0 09/05/2017   HGBA1C 5.8 (H) 05/23/2017   HGBA1C 5.7 04/12/2017   HGBA1C 5.8 (H) 05/19/2015   HGBA1C 5.7 06/06/2013   Lab Results  Component Value Date   INSULIN 24.3 09/05/2017   INSULIN 30.4 (H) 05/23/2017   CBC    Component Value Date/Time   WBC 7.2 05/23/2017 1249   WBC 8.3 08/27/2015 1633   RBC 4.66 05/23/2017 1249   RBC 4.27 08/27/2015 1633   HGB 14.7 05/23/2017 1249   HCT 43.3 05/23/2017 1249   PLT 213 04/05/2017 0947   MCV 93 05/23/2017 1249   MCH 31.5 05/23/2017 1249   MCH 31.1 08/27/2015 1633   MCHC 33.9 05/23/2017 1249   MCHC 34.3 08/27/2015 1633   RDW 13.6 05/23/2017 1249   LYMPHSABS 2.8 05/23/2017 1249   MONOABS 0.4 10/27/2013 1604   EOSABS 0.3 05/23/2017 1249   BASOSABS 0.0 05/23/2017 1249   Iron/TIBC/Ferritin/ %Sat    Component Value Date/Time   IRON 95 06/27/2013 1645   TIBC 371 06/27/2013 1645   FERRITIN 401 (H) 06/27/2013 1645   IRONPCTSAT 26 06/27/2013 1645   Lipid Panel     Component Value Date/Time   CHOL 242 (H) 09/05/2017 0929   TRIG 159 (H) 09/05/2017 0929   HDL 72 09/05/2017 0929   CHOLHDL 5.4 (H) 03/30/2017 1509   CHOLHDL 5.0 06/06/2013 1654   VLDL 27 06/06/2013 1654   LDLCALC 138 (H) 09/05/2017 0929   Hepatic Function Panel     Component Value Date/Time   PROT 5.6 (L) 09/05/2017 0929   ALBUMIN 3.4 (L) 09/05/2017 0929   AST 142 (H) 09/05/2017 0929   ALT 212 (H)  09/05/2017 0929   ALKPHOS 277 (H) 09/05/2017 0929   BILITOT 1.3 (H) 09/05/2017 0929   BILIDIR 0.2 04/07/2015 1349   IBILI 1.0 04/07/2015 1349      Component Value Date/Time   TSH 8.710 (H) 09/05/2017 0929   TSH 7.870 (H) 05/23/2017 1249   TSH 6.960 (H) 03/30/2017 1509   Results for DAMANI, RANDO (MRN [id number]) as of 09/25/2017 07:50  Ref. Range 09/05/2017 09:29  Vitamin D, 25-Hydroxy Latest Ref Range: 30.0 - 100.0 ng/mL 30.8   ASSESSMENT AND PLAN: Other specified hypothyroidism - Plan: levothyroxine (SYNTHROID, LEVOTHROID) 50 MCG tablet  Prediabetes - Plan: metFORMIN (GLUCOPHAGE) 500 MG tablet  Vitamin D deficiency - Plan: Vitamin D, Ergocalciferol, (DRISDOL) 50000 units CAPS capsule  NASH (nonalcoholic steatohepatitis)  Leg edema - Plan: furosemide (LASIX) 20 MG tablet  At risk for diabetes mellitus  Class 3 severe obesity with serious comorbidity and body mass index (BMI) of 45.0 to 49.9 in adult, unspecified obesity type ([place])  PLAN:  Vitamin D Deficiency Melanie [name] was informed that low vitamin D levels contributes to fatigue and are associated with obesity, breast, and colon cancer. She agrees to continue to take prescription Vit D @50 ,000 IU every week #4 with no refills and will follow up for routine testing of vitamin D, at least 2-3 times per year. She was informed of the risk of over-replacement of vitamin D and agrees to not increase her dose unless she discusses this with [place] first. Melanie [name] agrees to follow up with our clinic 2 weeks.  Hypothyroidism Melanie [name] was informed of the importance of good thyroid control to help with weight loss efforts. She was also informed that supertheraputic thyroid levels are dangerous and will not improve weight loss results. We will repeat thyroid panel in 6 weeks. Melanie [name] agreed to increase levothyroxine to 50 mcg  qAM #30 with no refills and follow up as directed.  Pre-Diabetes Melanie [name] will continue to work on weight loss, exercise, and decreasing  simple carbohydrates in her diet to help decrease the risk of diabetes. We dicussed metformin including benefits and risks. She was informed that eating too many simple carbohydrates or too many calories at one sitting increases the likelihood of GI side effects. Melanie [name] agrees to continue  metformin for now and a prescription was written today for 1 month refill. Melanie [name] agreed to follow up with [place] as directed to monitor her progress.  Diabetes risk counseling Melanie [name] was given extended (15 minutes) diabetes prevention counseling today. She is 42 y.o. female and has risk factors for diabetes including obesity and prediabetes. We discussed intensive lifestyle modifications today with an emphasis on weight loss as well as increasing exercise and decreasing simple carbohydrates in her diet.  NASH (non alcoholic steatohepatitis) Melanie [name] agrees to stop Lipitor; call gastroenterology and send MyChart message to discuss elevation of LFT's.  Leg Edema Melanie [name] agrees to take Furosemide 20 mg PO daily #30 with no refills and follow up as directed.  Obesity Melanie [name] is currently in the action stage of change. As such, her goal is to continue with weight loss efforts She has agreed to follow the Category 3 plan Melanie [name] has been instructed to work up to a goal of 150 minutes of combined cardio and strengthening exercise per week for weight loss and overall health benefits. We discussed the following Behavioral Modification Strategies today: planning for success, increasing lean protein intake, increasing vegetables and work on meal planning and easy cooking plans   Melanie [name] has agreed to follow up with our clinic in 2 weeks. She was informed of the importance of frequent follow up visits to maximize her success with intensive lifestyle modifications for her multiple health conditions.   OBESITY BEHAVIORAL INTERVENTION VISIT  Today's visit was # 8 out of 22.  Starting weight: 338 lbs Starting date: 05/23/17 Today's weight : 323  lbs  Today's date: 09/24/2017 Total lbs lost to date: 15    ASK: We discussed the diagnosis of obesity with Melanie [name] today and Melanie [name] agreed to give [place] permission to discuss obesity behavioral modification therapy today.  ASSESS: Melanie [name] has the diagnosis of obesity and her BMI today is 47.68 Melanie [name] is in the action stage of change   ADVISE: Melanie [name] was educated on the multiple health risks of obesity as well as the benefit of weight loss to improve her health. She was advised of the need for long term treatment and the importance of lifestyle modifications.  AGREE: Multiple dietary modification options and treatment options were discussed and  Melanie [name] agreed to the above obesity treatment plan.  I, Doreene Nest, am acting as transcriptionist for Eber Jones, MD  I have reviewed the above documentation for accuracy and completeness, and I agree with the above. - Ilene Qua, MD

## 2017-09-27 ENCOUNTER — Encounter: Payer: Self-pay | Admitting: Physician Assistant

## 2017-09-27 ENCOUNTER — Other Ambulatory Visit (INDEPENDENT_AMBULATORY_CARE_PROVIDER_SITE_OTHER): Payer: Self-pay | Admitting: Family Medicine

## 2017-09-27 DIAGNOSIS — E039 Hypothyroidism, unspecified: Secondary | ICD-10-CM

## 2017-09-27 DIAGNOSIS — E038 Other specified hypothyroidism: Secondary | ICD-10-CM

## 2017-09-28 ENCOUNTER — Ambulatory Visit: Payer: BC Managed Care – PPO | Admitting: Physician Assistant

## 2017-10-05 ENCOUNTER — Ambulatory Visit: Payer: BC Managed Care – PPO | Admitting: Physician Assistant

## 2017-10-07 ENCOUNTER — Encounter (INDEPENDENT_AMBULATORY_CARE_PROVIDER_SITE_OTHER): Payer: Self-pay | Admitting: Family Medicine

## 2017-10-08 NOTE — Telephone Encounter (Signed)
Please cancel

## 2017-10-09 ENCOUNTER — Ambulatory Visit (INDEPENDENT_AMBULATORY_CARE_PROVIDER_SITE_OTHER): Payer: Self-pay | Admitting: Family Medicine

## 2017-10-09 ENCOUNTER — Encounter (INDEPENDENT_AMBULATORY_CARE_PROVIDER_SITE_OTHER): Payer: Self-pay

## 2017-10-15 NOTE — Telephone Encounter (Signed)
Kaydee spoke to the patient and rescheduled her for 10/23/17 at 8:20am with Dr. Adair Patter.

## 2017-10-18 ENCOUNTER — Other Ambulatory Visit (INDEPENDENT_AMBULATORY_CARE_PROVIDER_SITE_OTHER): Payer: Self-pay | Admitting: Family Medicine

## 2017-10-18 DIAGNOSIS — E559 Vitamin D deficiency, unspecified: Secondary | ICD-10-CM

## 2017-10-20 ENCOUNTER — Other Ambulatory Visit (INDEPENDENT_AMBULATORY_CARE_PROVIDER_SITE_OTHER): Payer: Self-pay | Admitting: Family Medicine

## 2017-10-20 DIAGNOSIS — E038 Other specified hypothyroidism: Secondary | ICD-10-CM

## 2017-10-20 DIAGNOSIS — R7303 Prediabetes: Secondary | ICD-10-CM

## 2017-10-23 ENCOUNTER — Ambulatory Visit (INDEPENDENT_AMBULATORY_CARE_PROVIDER_SITE_OTHER): Payer: Self-pay | Admitting: Family Medicine

## 2017-10-23 ENCOUNTER — Encounter (INDEPENDENT_AMBULATORY_CARE_PROVIDER_SITE_OTHER): Payer: Self-pay

## 2017-10-24 ENCOUNTER — Ambulatory Visit (INDEPENDENT_AMBULATORY_CARE_PROVIDER_SITE_OTHER): Payer: BC Managed Care – PPO | Admitting: Physician Assistant

## 2017-10-24 VITALS — BP 113/77 | HR 69 | Temp 97.5°F | Ht 69.0 in | Wt 320.0 lb

## 2017-10-24 DIAGNOSIS — E038 Other specified hypothyroidism: Secondary | ICD-10-CM | POA: Diagnosis not present

## 2017-10-24 DIAGNOSIS — Z9189 Other specified personal risk factors, not elsewhere classified: Secondary | ICD-10-CM

## 2017-10-24 DIAGNOSIS — E559 Vitamin D deficiency, unspecified: Secondary | ICD-10-CM | POA: Diagnosis not present

## 2017-10-24 DIAGNOSIS — Z6841 Body Mass Index (BMI) 40.0 and over, adult: Secondary | ICD-10-CM

## 2017-10-24 DIAGNOSIS — R7303 Prediabetes: Secondary | ICD-10-CM

## 2017-10-24 DIAGNOSIS — R600 Localized edema: Secondary | ICD-10-CM | POA: Diagnosis not present

## 2017-10-24 MED ORDER — METFORMIN HCL 500 MG PO TABS: 500.0000 mg | ORAL_TABLET | Freq: Every day | ORAL | 0 refills | Status: DC

## 2017-10-24 MED ORDER — CHLORTHALIDONE 25 MG PO TABS: 25.0000 mg | ORAL_TABLET | Freq: Every day | ORAL | 0 refills | Status: DC

## 2017-10-24 MED ORDER — VITAMIN D (ERGOCALCIFEROL) 1.25 MG (50000 UNIT) PO CAPS: ORAL_CAPSULE | ORAL | 0 refills | Status: DC

## 2017-10-24 MED ORDER — LEVOTHYROXINE SODIUM 50 MCG PO TABS: 50.0000 ug | ORAL_TABLET | Freq: Every day | ORAL | 0 refills | Status: DC

## 2017-10-24 NOTE — Progress Notes (Signed)
Office: [phone]  /  Fax: [phone]   HPI:   Chief Complaint: OBESITY Melanie [name] is here to discuss her progress with her obesity treatment plan. She is on the Category 3 plan and is following her eating plan approximately 50 % of the time. She states she walked 45 minutes 2 days only. Melanie [name] did very well with weight loss. She reports being under a tremendous amount of stress with family situations. She states that she has been unable to do meal planning and has been skipping some meals. She is ready to get back on track. Her weight is (!) 320 lb (145.2 kg) today and has had a weight loss of 3 pounds over a period of 4 weeks since her last visit. She has lost 18 lbs since starting treatment with [place].  Hypothyroidism Melanie [name] has a diagnosis of hypothyroidism. She is on levothyroxine. She denies hot or cold intolerance.   Pre-Diabetes Melanie [name] has a diagnosis of prediabetes based on her elevated Hgb A1c and was informed this puts her at greater risk of developing diabetes. She is taking metformin currently and continues to work on diet and exercise to decrease risk of diabetes. She denies nausea, vomiting or diarrhea. Melanie [name] denies hypoglycemia.  At risk for diabetes Melanie [name] is at higher than average risk for developing diabetes due to her obesity and prediabetes. She currently denies polyuria or polydipsia.  Vitamin D deficiency Melanie [name] has a diagnosis of vitamin D deficiency. She is currently taking vit D and denies nausea, vomiting or muscle weakness.  Edema bilateral lower extremities Melanie [name] reports continual swelling in bilateral lower extremities with lasix.  ALLERGIES: Allergies  Allergen Reactions  . Lidocaine Other (See Comments)    Flu like sx's  . Relafen [Nabumetone] Other (See Comments)    migraine  . Celebrex [Celecoxib] Rash  . Sulfa Antibiotics Rash    MEDICATIONS: Current Outpatient Medications on File Prior to Visit  Medication Sig Dispense Refill  . buPROPion (WELLBUTRIN) 75  MG tablet Take 2 tablets (150 mg total) by mouth 2 (two) times daily. 360 tablet 3  . busPIRone (BUSPAR) 15 MG tablet TAKE ONE TABLET BY MOUTH TWICE A DAY OR AS DIRECTED 60 tablet 2  . cetirizine (ZYRTEC) 10 MG tablet Take 10 mg by mouth daily.    . citalopram (CELEXA) 20 MG tablet TAKE ONE TABLET BY MOUTH DAILY 90 tablet 3  . fluticasone (FLONASE) 50 MCG/ACT nasal spray Place 2 sprays into both nostrils daily. 48 g 3  . levothyroxine (SYNTHROID, LEVOTHROID) 50 MCG tablet Take 1 tablet (50 mcg total) by mouth daily. 30 tablet 0  . LORazepam (ATIVAN) 1 MG tablet Take 1 tablet (1 mg total) by mouth 2 (two) times daily as needed for anxiety. 20 tablet 0  . metFORMIN (GLUCOPHAGE) 500 MG tablet Take 1 tablet (500 mg total) by mouth daily with breakfast. 30 tablet 0  . montelukast (SINGULAIR) 10 MG tablet Take 1 tablet (10 mg total) by mouth at bedtime. 90 tablet 3  . Multiple Vitamin (MULTIVITAMIN) tablet Take 1 tablet by mouth daily.    . Omega-3 Fatty Acids (FISH OIL PO) Take by mouth daily.    . Vitamin D, Ergocalciferol, (DRISDOL) 50000 units CAPS capsule TAKE ONE CAPSULE BY MOUTH EVERY SEVEN DAYS 4 capsule 0  . atorvastatin (LIPITOR) 20 MG tablet Take 1 tablet (20 mg total) by mouth daily. (Patient not taking: Reported on 10/24/2017) 90 tablet 3   Current Facility-Administered Medications on File Prior to Visit  Medication Dose  Route Frequency Provider Last Rate Last Dose  . cefTRIAXone (ROCEPHIN) injection 1 g  1 g Intramuscular Once Rutherford Guys, MD        PAST MEDICAL HISTORY: Past Medical History:  Diagnosis Date  . Allergy   . Anemia   . Anxiety   . Back pain   . Constipation   . Depression   . Fatty liver   . GERD (gastroesophageal reflux disease)   . Hyperlipidemia   . Infertility, female   . Joint pain   . Kidney problem   . Membranous nephrosis   . Multiple food allergies   . Muscle pain   . Numbness and tingling in both hands   . Pre-diabetes   . Shortness of  breath   . Sleep apnea   . Stress   . Swelling of both lower extremities     PAST SURGICAL HISTORY: Past Surgical History:  Procedure Laterality Date  . FINGER SURGERY Left     index finger  . KNEE SURGERY Left   . LIVER BIOPSY N/A 05/15/2014   Procedure: LIVER BIOPSY;  Surgeon: Inda Castle, MD;  Location: [facility];  Service: Endoscopy;  Laterality: N/A;  ultrasound to mark the liver  . TONSILLECTOMY AND ADENOIDECTOMY  1978  . TYMPANOSTOMY TUBE PLACEMENT    . wrist cyst Left   . WRIST SURGERY     left - cyst removed    SOCIAL HISTORY: Social History   Tobacco Use  . Smoking status: Never Smoker  . Smokeless tobacco: Never Used  Substance Use Topics  . Alcohol use: No    Alcohol/week: 0.0 standard drinks  . Drug use: No    FAMILY HISTORY: Family History  Problem Relation Age of Onset  . Diabetes Mother   . Liver disease Mother        ESLD- unknown etiology  . Obesity Mother   . Diabetes Maternal Grandmother   . Diabetes Paternal Grandmother   . Breast cancer Paternal Grandmother   . Stroke Maternal Grandfather   . Hyperlipidemia Father     ROS: Review of Systems  Constitutional: Positive for weight loss.  Cardiovascular: Positive for leg swelling (bilateral lower extremities).  Gastrointestinal: Negative for diarrhea, nausea and vomiting.  Genitourinary: Negative for frequency.  Musculoskeletal:       Negative for muscle weakness  Endo/Heme/Allergies: Negative for polydipsia.       Negative for heat or cold intolerance Negative for hypoglycemia    PHYSICAL EXAM: Blood pressure 113/77, pulse 69, temperature (!) 97.5 F (36.4 C), temperature source Oral, height 5' 9"  (1.753 m), weight (!) 320 lb (145.2 kg), last menstrual period 10/23/2017, SpO2 96 %. Body mass index is 47.26 kg/m. Physical Exam  Constitutional: She is oriented to person, place, and time. She appears well-developed and well-nourished.  Cardiovascular: Normal rate.    Pulmonary/Chest: Effort normal.  Musculoskeletal: Normal range of motion. She exhibits edema (bilateral lower extremities).  Neurological: She is oriented to person, place, and time.  Skin: Skin is warm and dry.  Psychiatric: She has a normal mood and affect. Her behavior is normal.  Vitals reviewed.   RECENT LABS AND TESTS: BMET    Component Value Date/Time   NA 141 09/05/2017 0929   K 4.2 09/05/2017 0929   CL 103 09/05/2017 0929   CO2 24 09/05/2017 0929   GLUCOSE 117 (H) 09/05/2017 0929   GLUCOSE 118 (H) 09/08/2015 1514   BUN 14 09/05/2017 0929   CREATININE 0.71 09/05/2017 0929  CREATININE 0.85 09/08/2015 1514   CALCIUM 9.1 09/05/2017 0929   GFRNONAA 105 09/05/2017 0929   GFRNONAA 85 09/08/2015 1514   GFRAA 121 09/05/2017 0929   GFRAA >89 09/08/2015 1514   Lab Results  Component Value Date   HGBA1C 5.0 09/05/2017   HGBA1C 5.8 (H) 05/23/2017   HGBA1C 5.7 04/12/2017   HGBA1C 5.8 (H) 05/19/2015   HGBA1C 5.7 06/06/2013   Lab Results  Component Value Date   INSULIN 24.3 09/05/2017   INSULIN 30.4 (H) 05/23/2017   CBC    Component Value Date/Time   WBC 7.2 05/23/2017 1249   WBC 8.3 08/27/2015 1633   RBC 4.66 05/23/2017 1249   RBC 4.27 08/27/2015 1633   HGB 14.7 05/23/2017 1249   HCT 43.3 05/23/2017 1249   PLT 213 04/05/2017 0947   MCV 93 05/23/2017 1249   MCH 31.5 05/23/2017 1249   MCH 31.1 08/27/2015 1633   MCHC 33.9 05/23/2017 1249   MCHC 34.3 08/27/2015 1633   RDW 13.6 05/23/2017 1249   LYMPHSABS 2.8 05/23/2017 1249   MONOABS 0.4 10/27/2013 1604   EOSABS 0.3 05/23/2017 1249   BASOSABS 0.0 05/23/2017 1249   Iron/TIBC/Ferritin/ %Sat    Component Value Date/Time   IRON 95 06/27/2013 1645   TIBC 371 06/27/2013 1645   FERRITIN 401 (H) 06/27/2013 1645   IRONPCTSAT 26 06/27/2013 1645   Lipid Panel     Component Value Date/Time   CHOL 242 (H) 09/05/2017 0929   TRIG 159 (H) 09/05/2017 0929   HDL 72 09/05/2017 0929   CHOLHDL 5.4 (H) 03/30/2017 1509    CHOLHDL 5.0 06/06/2013 1654   VLDL 27 06/06/2013 1654   LDLCALC 138 (H) 09/05/2017 0929   Hepatic Function Panel     Component Value Date/Time   PROT 5.6 (L) 09/05/2017 0929   ALBUMIN 3.4 (L) 09/05/2017 0929   AST 142 (H) 09/05/2017 0929   ALT 212 (H) 09/05/2017 0929   ALKPHOS 277 (H) 09/05/2017 0929   BILITOT 1.3 (H) 09/05/2017 0929   BILIDIR 0.2 04/07/2015 1349   IBILI 1.0 04/07/2015 1349      Component Value Date/Time   TSH 8.710 (H) 09/05/2017 0929   TSH 7.870 (H) 05/23/2017 1249   TSH 6.960 (H) 03/30/2017 1509   Results for DANESHIA, TAVANO (MRN [id number]) as of 10/24/2017 08:27  Ref. Range 09/05/2017 09:29  Vitamin D, 25-Hydroxy Latest Ref Range: 30.0 - 100.0 ng/mL 30.8   ASSESSMENT AND PLAN: Prediabetes - Plan: metFORMIN (GLUCOPHAGE) 500 MG tablet  Vitamin D deficiency - Plan: Vitamin D, Ergocalciferol, (DRISDOL) 50000 units CAPS capsule  Other specified hypothyroidism - Plan: levothyroxine (SYNTHROID, LEVOTHROID) 50 MCG tablet  Bilateral edema of lower extremity - Plan: chlorthalidone (HYGROTON) 25 MG tablet  At risk for diabetes mellitus  Class 3 severe obesity with serious comorbidity and body mass index (BMI) of 45.0 to 49.9 in adult, unspecified obesity type ([place])  PLAN:  Hypothyroidism Melanie [name] was informed of the importance of good thyroid control to help with weight loss efforts. She was also informed that supertheraputic thyroid levels are dangerous and will not improve weight loss results. Melanie [name] agrees to continue levothyroxine 50 mg qd #30 with no refills and follow up as directed.  Pre-Diabetes Melanie [name] will continue to work on weight loss, exercise, and decreasing simple carbohydrates in her diet to help decrease the risk of diabetes. We dicussed metformin including benefits and risks. She was informed that eating too many simple carbohydrates or too many calories at one sitting  increases the likelihood of GI side effects. Melanie [name] requested metformin for now and a  prescription was written today for 1 refill. Melanie [name] agreed to follow up with [place] as directed to monitor her progress.  Diabetes risk counseling Melanie [name] was given extended (15 minutes) diabetes prevention counseling today. She is 42 y.o. female and has risk factors for diabetes including obesity and prediabetes. We discussed intensive lifestyle modifications today with an emphasis on weight loss as well as increasing exercise and decreasing simple carbohydrates in her diet.  Vitamin D Deficiency Melanie [name] was informed that low vitamin D levels contributes to fatigue and are associated with obesity, breast, and colon cancer. She agrees to continue to take prescription Vit D @50 ,000 IU every week #4 with no refills and will follow up for routine testing of vitamin D, at least 2-3 times per year. She was informed of the risk of over-replacement of vitamin D and agrees to not increase her dose unless she discusses this with [place] first. Melanie [name] agrees to follow up as directed.  Edema bilateral lower extremities Melanie [name] agrees to discontinue Lasix and start Chlorthalidone 25 mg qAM #30 with no refills and follow up as directed.  Obesity Melanie [name] is currently in the action stage of change. As such, her goal is to continue with weight loss efforts She has agreed to keep a food journal with 550 to 700 calories and 45 grams of protein at supper daily and follow the Category 3 plan Khamani has been instructed to work up to a goal of 150 minutes of combined cardio and strengthening exercise per week for weight loss and overall health benefits. We discussed the following Behavioral Modification Strategies today: no skipping meals and work on meal planning and easy cooking plans  Melanie [name] has agreed to follow up with our clinic in 2 to 3 weeks. She was informed of the importance of frequent follow up visits to maximize her success with intensive lifestyle modifications for her multiple health conditions.   OBESITY BEHAVIORAL INTERVENTION  VISIT  Today's visit was # 9 out of 22.  Starting weight: 338 lbs Starting date: 05/23/17 Today's weight : 320 lbs  Today's date: 10/24/2017 Total lbs lost to date: 42    ASK: We discussed the diagnosis of obesity with Melanie [name] today and Melanie [name] agreed to give [place] permission to discuss obesity behavioral modification therapy today.  ASSESS: Melanie [name] has the diagnosis of obesity and her BMI today is 47.23 Rheya is in the action stage of change   ADVISE: Melanie [name] was educated on the multiple health risks of obesity as well as the benefit of weight loss to improve her health. She was advised of the need for long term treatment and the importance of lifestyle modifications.  AGREE: Multiple dietary modification options and treatment options were discussed and  Melanie [name] agreed to the above obesity treatment plan.  Corey Skains, am acting as transcriptionist for Abby Potash, PA-C I, Abby Potash, PA-C have reviewed above note and agree with its content

## 2017-11-05 ENCOUNTER — Other Ambulatory Visit: Payer: Self-pay | Admitting: Family Medicine

## 2017-11-05 DIAGNOSIS — F418 Other specified anxiety disorders: Secondary | ICD-10-CM

## 2017-11-05 DIAGNOSIS — F4323 Adjustment disorder with mixed anxiety and depressed mood: Secondary | ICD-10-CM

## 2017-11-05 NOTE — Telephone Encounter (Signed)
Dr Nolon Rod please advise. Dgaddy, CMA

## 2017-11-05 NOTE — Telephone Encounter (Signed)
Buspar 20m 2x day as needed refill Last refill5/31/19  # 60x2 refills PCP Chelle Jeffrey LOV2/28/19 Pharmacy [facility] [address]  Cancelled appointment with SHorris LatinoJuly  Has not rescheduled to establish care with new PCP.

## 2017-11-07 ENCOUNTER — Encounter (INDEPENDENT_AMBULATORY_CARE_PROVIDER_SITE_OTHER): Payer: Self-pay | Admitting: Physician Assistant

## 2017-11-07 ENCOUNTER — Ambulatory Visit (INDEPENDENT_AMBULATORY_CARE_PROVIDER_SITE_OTHER): Payer: BC Managed Care – PPO | Admitting: Physician Assistant

## 2017-11-07 VITALS — BP 120/76 | HR 82 | Temp 98.2°F | Ht 69.0 in | Wt 314.0 lb

## 2017-11-07 DIAGNOSIS — E559 Vitamin D deficiency, unspecified: Secondary | ICD-10-CM | POA: Diagnosis not present

## 2017-11-07 DIAGNOSIS — Z6841 Body Mass Index (BMI) 40.0 and over, adult: Secondary | ICD-10-CM | POA: Diagnosis not present

## 2017-11-08 NOTE — Progress Notes (Signed)
Office: [phone]  /  Fax: [phone]   HPI:   Chief Complaint: OBESITY Melanie [name] is here to discuss her progress with her obesity treatment plan. She is on the keep a food journal with 550 to 700 calories and 45 grams of protein at supper daily plan and follow the Category 3 plan and is following her eating plan approximately 75 % of the time. She states she is exercising 0 minutes 0 times per week. Melanie [name] did very well with weight loss. She reports getting all of her food in on the plan. She is enjoying journaling. Her weight is (!) 314 lb (142.4 kg) today and has had a weight loss of 6 pounds over a period of 2 weeks since her last visit. She has lost 24 lbs since starting treatment with [place].  Vitamin D deficiency Melanie [name] has a diagnosis of vitamin D deficiency. She is currently taking vit D and denies nausea, vomiting or muscle weakness.  ALLERGIES: Allergies  Allergen Reactions   Lidocaine Other (See Comments)    Flu like sx's   Relafen [Nabumetone] Other (See Comments)    migraine   Celebrex [Celecoxib] Rash   Sulfa Antibiotics Rash    MEDICATIONS: Current Outpatient Medications on File Prior to Visit  Medication Sig Dispense Refill   atorvastatin (LIPITOR) 20 MG tablet Take 1 tablet (20 mg total) by mouth daily. 90 tablet 3   buPROPion (WELLBUTRIN) 75 MG tablet Take 2 tablets (150 mg total) by mouth 2 (two) times daily. 360 tablet 3   busPIRone (BUSPAR) 15 MG tablet TAKE ONE TABLET BY MOUTH TWICE A DAY OR AS DIRECTED 60 tablet 2   cetirizine (ZYRTEC) 10 MG tablet Take 10 mg by mouth daily.     chlorthalidone (HYGROTON) 25 MG tablet Take 1 tablet (25 mg total) by mouth daily. 30 tablet 0   citalopram (CELEXA) 20 MG tablet TAKE ONE TABLET BY MOUTH DAILY 90 tablet 3   fluticasone (FLONASE) 50 MCG/ACT nasal spray Place 2 sprays into both nostrils daily. 48 g 3   levothyroxine (SYNTHROID, LEVOTHROID) 50 MCG tablet Take 1 tablet (50 mcg total) by mouth daily. 30 tablet 0     LORazepam (ATIVAN) 1 MG tablet Take 1 tablet (1 mg total) by mouth 2 (two) times daily as needed for anxiety. 20 tablet 0   metFORMIN (GLUCOPHAGE) 500 MG tablet Take 1 tablet (500 mg total) by mouth daily with breakfast. 30 tablet 0   montelukast (SINGULAIR) 10 MG tablet Take 1 tablet (10 mg total) by mouth at bedtime. 90 tablet 3   Multiple Vitamin (MULTIVITAMIN) tablet Take 1 tablet by mouth daily.     Omega-3 Fatty Acids (FISH OIL PO) Take by mouth daily.     Vitamin D, Ergocalciferol, (DRISDOL) 50000 units CAPS capsule TAKE ONE CAPSULE BY MOUTH EVERY SEVEN DAYS 4 capsule 0   Current Facility-Administered Medications on File Prior to Visit  Medication Dose Route Frequency Provider Last Rate Last Dose   cefTRIAXone (ROCEPHIN) injection 1 g  1 g Intramuscular Once Rutherford Guys, MD        PAST MEDICAL HISTORY: Past Medical History:  Diagnosis Date   Allergy    Anemia    Anxiety    Back pain    Constipation    Depression    Fatty liver    GERD (gastroesophageal reflux disease)    Hyperlipidemia    Infertility, female    Joint pain    Kidney problem    Membranous nephrosis  Multiple food allergies    Muscle pain    Numbness and tingling in both hands    Pre-diabetes    Shortness of breath    Sleep apnea    Stress    Swelling of both lower extremities     PAST SURGICAL HISTORY: Past Surgical History:  Procedure Laterality Date   FINGER SURGERY Left     index finger   KNEE SURGERY Left    LIVER BIOPSY N/A 05/15/2014   Procedure: LIVER BIOPSY;  Surgeon: Inda Castle, MD;  Location: [facility];  Service: Endoscopy;  Laterality: N/A;  ultrasound to mark the liver   TONSILLECTOMY AND ADENOIDECTOMY  1978   TYMPANOSTOMY TUBE PLACEMENT     wrist cyst Left    WRIST SURGERY     left - cyst removed    SOCIAL HISTORY: Social History   Tobacco Use   Smoking status: Never Smoker   Smokeless tobacco: Never Used  Substance Use  Topics   Alcohol use: No    Alcohol/week: 0.0 standard drinks   Drug use: No    FAMILY HISTORY: Family History  Problem Relation Age of Onset   Diabetes Mother    Liver disease Mother        ESLD- unknown etiology   Obesity Mother    Diabetes Maternal Grandmother    Diabetes Paternal Grandmother    Breast cancer Paternal Grandmother    Stroke Maternal Grandfather    Hyperlipidemia Father     ROS: Review of Systems  Constitutional: Positive for weight loss.  Gastrointestinal: Negative for nausea and vomiting.  Musculoskeletal:       Negative for muscle weakness    PHYSICAL EXAM: Blood pressure 120/76, pulse 82, temperature 98.2 F (36.8 C), temperature source Oral, height 5' 9"  (1.753 m), weight (!) 314 lb (142.4 kg), last menstrual period 10/23/2017, SpO2 95 %. Body mass index is 46.37 kg/m. Physical Exam  Constitutional: She is oriented to person, place, and time. She appears well-developed and well-nourished.  Cardiovascular: Normal rate.  Pulmonary/Chest: Effort normal.  Musculoskeletal: Normal range of motion.  Neurological: She is oriented to person, place, and time.  Skin: Skin is warm and dry.  Psychiatric: She has a normal mood and affect. Her behavior is normal.  Vitals reviewed.   RECENT LABS AND TESTS: BMET    Component Value Date/Time   NA 141 09/05/2017 0929   K 4.2 09/05/2017 0929   CL 103 09/05/2017 0929   CO2 24 09/05/2017 0929   GLUCOSE 117 (H) 09/05/2017 0929   GLUCOSE 118 (H) 09/08/2015 1514   BUN 14 09/05/2017 0929   CREATININE 0.71 09/05/2017 0929   CREATININE 0.85 09/08/2015 1514   CALCIUM 9.1 09/05/2017 0929   GFRNONAA 105 09/05/2017 0929   GFRNONAA 85 09/08/2015 1514   GFRAA 121 09/05/2017 0929   GFRAA >89 09/08/2015 1514   Lab Results  Component Value Date   HGBA1C 5.0 09/05/2017   HGBA1C 5.8 (H) 05/23/2017   HGBA1C 5.7 04/12/2017   HGBA1C 5.8 (H) 05/19/2015   HGBA1C 5.7 06/06/2013   Lab Results  Component  Value Date   INSULIN 24.3 09/05/2017   INSULIN 30.4 (H) 05/23/2017   CBC    Component Value Date/Time   WBC 7.2 05/23/2017 1249   WBC 8.3 08/27/2015 1633   RBC 4.66 05/23/2017 1249   RBC 4.27 08/27/2015 1633   HGB 14.7 05/23/2017 1249   HCT 43.3 05/23/2017 1249   PLT 213 04/05/2017 0947   MCV 93  05/23/2017 1249   MCH 31.5 05/23/2017 1249   MCH 31.1 08/27/2015 1633   MCHC 33.9 05/23/2017 1249   MCHC 34.3 08/27/2015 1633   RDW 13.6 05/23/2017 1249   LYMPHSABS 2.8 05/23/2017 1249   MONOABS 0.4 10/27/2013 1604   EOSABS 0.3 05/23/2017 1249   BASOSABS 0.0 05/23/2017 1249   Iron/TIBC/Ferritin/ %Sat    Component Value Date/Time   IRON 95 06/27/2013 1645   TIBC 371 06/27/2013 1645   FERRITIN 401 (H) 06/27/2013 1645   IRONPCTSAT 26 06/27/2013 1645   Lipid Panel     Component Value Date/Time   CHOL 242 (H) 09/05/2017 0929   TRIG 159 (H) 09/05/2017 0929   HDL 72 09/05/2017 0929   CHOLHDL 5.4 (H) 03/30/2017 1509   CHOLHDL 5.0 06/06/2013 1654   VLDL 27 06/06/2013 1654   LDLCALC 138 (H) 09/05/2017 0929   Hepatic Function Panel     Component Value Date/Time   PROT 5.6 (L) 09/05/2017 0929   ALBUMIN 3.4 (L) 09/05/2017 0929   AST 142 (H) 09/05/2017 0929   ALT 212 (H) 09/05/2017 0929   ALKPHOS 277 (H) 09/05/2017 0929   BILITOT 1.3 (H) 09/05/2017 0929   BILIDIR 0.2 04/07/2015 1349   IBILI 1.0 04/07/2015 1349      Component Value Date/Time   TSH 8.710 (H) 09/05/2017 0929   TSH 7.870 (H) 05/23/2017 1249   TSH 6.960 (H) 03/30/2017 1509   Results for DEEANN, Melanie [name] (MRN [id number]) as of 11/08/2017 09:31  Ref. Range 09/05/2017 09:29  Vitamin D, 25-Hydroxy Latest Ref Range: 30.0 - 100.0 ng/mL 30.8   ASSESSMENT AND PLAN: Vitamin D deficiency  Class 3 severe obesity with serious comorbidity and body mass index (BMI) of 45.0 to 49.9 in adult, unspecified obesity type ([place])  PLAN:  Vitamin D Deficiency Melanie [name] was informed that low vitamin D levels contributes to fatigue and are  associated with obesity, breast, and colon cancer. She agrees to continue to take prescription Vit D @50 ,000 IU every week and will follow up for routine testing of vitamin D, at least 2-3 times per year. She was informed of the risk of over-replacement of vitamin D and agrees to not increase her dose unless she discusses this with [place] first.  I spent > than 50% of the 15 minute visit on counseling as documented in the note.  Obesity Melanie [name] is currently in the action stage of change. As such, her goal is to continue with weight loss efforts She has agreed to keep a food journal with 550 to 700 calories and 45 grams of protein at supper daily and follow the Category 3 plan Melanie [name] has been instructed to work up to a goal of 150 minutes of combined cardio and strengthening exercise per week for weight loss and overall health benefits. We discussed the following Behavioral Modification Strategies today: work on meal planning and easy cooking plans and planning for success  Melanie [name] has agreed to follow up with our clinic in 2 weeks. She was informed of the importance of frequent follow up visits to maximize her success with intensive lifestyle modifications for her multiple health conditions.   OBESITY BEHAVIORAL INTERVENTION VISIT  Today's visit was # 10   Starting weight: 338 lbs Starting date: 05/23/17 Today's weight : 314 lbs Today's date: 11/07/2017 Total lbs lost to date: 24   ASK: We discussed the diagnosis of obesity with Melanie [name] today and Melanie [name] agreed to give [place] permission to discuss obesity behavioral modification therapy today.  ASSESS: Melanie [name] has the diagnosis of obesity and her BMI today is 46.35 Melanie [name] is in the action stage of change   ADVISE: Melanie [name] was educated on the multiple health risks of obesity as well as the benefit of weight loss to improve her health. She was advised of the need for long term treatment and the importance of lifestyle modifications to improve her current  health and to decrease her risk of future health problems.  AGREE: Multiple dietary modification options and treatment options were discussed and  Melanie [name] agreed to follow the recommendations documented in the above note.  ARRANGE: Melanie [name] was educated on the importance of frequent visits to treat obesity as outlined per CMS and USPSTF guidelines and agreed to schedule her next follow up appointment today.  Corey Skains, am acting as transcriptionist for Abby Potash, PA-C I, Abby Potash, PA-C have reviewed above note and agree with its content

## 2017-11-17 ENCOUNTER — Other Ambulatory Visit (INDEPENDENT_AMBULATORY_CARE_PROVIDER_SITE_OTHER): Payer: Self-pay | Admitting: Physician Assistant

## 2017-11-17 DIAGNOSIS — E559 Vitamin D deficiency, unspecified: Secondary | ICD-10-CM

## 2017-11-19 ENCOUNTER — Other Ambulatory Visit (INDEPENDENT_AMBULATORY_CARE_PROVIDER_SITE_OTHER): Payer: Self-pay | Admitting: Physician Assistant

## 2017-11-19 DIAGNOSIS — R7303 Prediabetes: Secondary | ICD-10-CM

## 2017-11-19 DIAGNOSIS — R600 Localized edema: Secondary | ICD-10-CM

## 2017-11-21 ENCOUNTER — Ambulatory Visit (INDEPENDENT_AMBULATORY_CARE_PROVIDER_SITE_OTHER): Payer: Self-pay | Admitting: Physician Assistant

## 2017-11-21 ENCOUNTER — Telehealth (INDEPENDENT_AMBULATORY_CARE_PROVIDER_SITE_OTHER): Payer: Self-pay | Admitting: Physician Assistant

## 2017-11-21 ENCOUNTER — Encounter (INDEPENDENT_AMBULATORY_CARE_PROVIDER_SITE_OTHER): Payer: Self-pay

## 2017-11-21 NOTE — Telephone Encounter (Signed)
Melanie [name] had to cancel her appt with Melanie [name] at 2:30 this afternoon because she is sick.  She rescheduled to Monday, 9/16.  She is requesting a refill of the (4) prescriptions she gets from our office.  She uses [facility] at [facility].  Thank you.

## 2017-11-22 ENCOUNTER — Encounter (INDEPENDENT_AMBULATORY_CARE_PROVIDER_SITE_OTHER): Payer: Self-pay

## 2017-11-26 ENCOUNTER — Ambulatory Visit (INDEPENDENT_AMBULATORY_CARE_PROVIDER_SITE_OTHER): Payer: BC Managed Care – PPO | Admitting: Physician Assistant

## 2017-11-26 VITALS — BP 139/79 | HR 75 | Temp 97.7°F | Ht 69.0 in | Wt 324.0 lb

## 2017-11-26 DIAGNOSIS — I10 Essential (primary) hypertension: Secondary | ICD-10-CM | POA: Diagnosis not present

## 2017-11-26 DIAGNOSIS — Z6841 Body Mass Index (BMI) 40.0 and over, adult: Secondary | ICD-10-CM

## 2017-11-26 DIAGNOSIS — E038 Other specified hypothyroidism: Secondary | ICD-10-CM

## 2017-11-26 DIAGNOSIS — Z9189 Other specified personal risk factors, not elsewhere classified: Secondary | ICD-10-CM | POA: Diagnosis not present

## 2017-11-26 DIAGNOSIS — R7303 Prediabetes: Secondary | ICD-10-CM | POA: Diagnosis not present

## 2017-11-26 DIAGNOSIS — E559 Vitamin D deficiency, unspecified: Secondary | ICD-10-CM

## 2017-11-26 MED ORDER — CHLORTHALIDONE 25 MG PO TABS: 25.0000 mg | ORAL_TABLET | Freq: Every day | ORAL | 0 refills | Status: DC

## 2017-11-26 MED ORDER — LEVOTHYROXINE SODIUM 50 MCG PO TABS: 50.0000 ug | ORAL_TABLET | Freq: Every day | ORAL | 0 refills | Status: DC

## 2017-11-26 MED ORDER — VITAMIN D (ERGOCALCIFEROL) 1.25 MG (50000 UNIT) PO CAPS: ORAL_CAPSULE | ORAL | 0 refills | Status: DC

## 2017-11-26 MED ORDER — METFORMIN HCL 500 MG PO TABS: 500.0000 mg | ORAL_TABLET | Freq: Every day | ORAL | 0 refills | Status: DC

## 2017-11-26 NOTE — Telephone Encounter (Signed)
Prescriptions taken care of until visit. Melanie [name], [place]

## 2017-11-28 NOTE — Progress Notes (Signed)
Office: [phone]  /  Fax: [phone]   HPI:   Chief Complaint: OBESITY Melanie [name] is here to discuss her progress with her obesity treatment plan. She is on the  keep a food journal with 550 to 700 calories and 45 grams of protein at supper daily and the Category 3 plan and is following her eating plan approximately 0 % of the time. She states she is exercising 0 minutes 0 times per week. Melanie [name] struggled to follow the plan due to family stress. She has been snacking on unhealthy foods recently. She reports that she does not believe that she can follow an organized plan at this time due to the amount of stress she is under.  Her weight is (!) 324 lb (147 kg) today and has not lost weight since her last visit. She has lost 14 lbs since starting treatment with [place].  Hypertension Melanie [name] is a 42 y.o. female with hypertension  Melanie [name] denies chest pain. She is working weight loss to help control her blood pressure with the goal of decreasing her risk of heart attack and stroke. Melanie [name] blood pressure is normal.  Pre-Diabetes Melanie [name] has a diagnosis of prediabetes based on her elevated Hgb A1c and was informed this puts her at greater risk of developing diabetes. She is taking metformin currently and continues to work on diet and exercise to decrease risk of diabetes. She denies polyphagia or hypoglycemia.  At risk for diabetes Melanie [name] is at higher than average risk for developing diabetes due to her obesity and prediabetes. She currently denies polyuria or polydipsia.  Hypothyroidism Melanie [name] has a diagnosis of hypothyroidism. She is on levothyroxine. She denies hot or cold intolerance.   Vitamin D deficiency Melanie [name] has a diagnosis of vitamin D deficiency. She is currently taking vit D and denies nausea, vomiting or muscle weakness.  ALLERGIES: Allergies  Allergen Reactions  . Lidocaine Other (See Comments)    Flu like sx's  . Relafen [Nabumetone] Other (See Comments)    migraine  .  Celebrex [Celecoxib] Rash  . Sulfa Antibiotics Rash    MEDICATIONS: Current Outpatient Medications on File Prior to Visit  Medication Sig Dispense Refill  . atorvastatin (LIPITOR) 20 MG tablet Take 1 tablet (20 mg total) by mouth daily. 90 tablet 3  . buPROPion (WELLBUTRIN) 75 MG tablet Take 2 tablets (150 mg total) by mouth 2 (two) times daily. 360 tablet 3  . busPIRone (BUSPAR) 15 MG tablet TAKE ONE TABLET BY MOUTH TWICE A DAY OR AS DIRECTED 60 tablet 1  . cetirizine (ZYRTEC) 10 MG tablet Take 10 mg by mouth daily.    . citalopram (CELEXA) 20 MG tablet TAKE ONE TABLET BY MOUTH DAILY 90 tablet 3  . fluticasone (FLONASE) 50 MCG/ACT nasal spray Place 2 sprays into both nostrils daily. 48 g 3  . LORazepam (ATIVAN) 1 MG tablet Take 1 tablet (1 mg total) by mouth 2 (two) times daily as needed for anxiety. 20 tablet 0  . montelukast (SINGULAIR) 10 MG tablet Take 1 tablet (10 mg total) by mouth at bedtime. 90 tablet 3  . Multiple Vitamin (MULTIVITAMIN) tablet Take 1 tablet by mouth daily.    . Omega-3 Fatty Acids (FISH OIL PO) Take by mouth daily.     Current Facility-Administered Medications on File Prior to Visit  Medication Dose Route Frequency Provider Last Rate Last Dose  . cefTRIAXone (ROCEPHIN) injection 1 g  1 g Intramuscular Once Rutherford Guys, MD  PAST MEDICAL HISTORY: Past Medical History:  Diagnosis Date  . Allergy   . Anemia   . Anxiety   . Back pain   . Constipation   . Depression   . Fatty liver   . GERD (gastroesophageal reflux disease)   . Hyperlipidemia   . Infertility, female   . Joint pain   . Kidney problem   . Membranous nephrosis   . Multiple food allergies   . Muscle pain   . Numbness and tingling in both hands   . Pre-diabetes   . Shortness of breath   . Sleep apnea   . Stress   . Swelling of both lower extremities     PAST SURGICAL HISTORY: Past Surgical History:  Procedure Laterality Date  . FINGER SURGERY Left     index finger  .  KNEE SURGERY Left   . LIVER BIOPSY N/A 05/15/2014   Procedure: LIVER BIOPSY;  Surgeon: Inda Castle, MD;  Location: [facility];  Service: Endoscopy;  Laterality: N/A;  ultrasound to mark the liver  . TONSILLECTOMY AND ADENOIDECTOMY  1978  . TYMPANOSTOMY TUBE PLACEMENT    . wrist cyst Left   . WRIST SURGERY     left - cyst removed    SOCIAL HISTORY: Social History   Tobacco Use  . Smoking status: Never Smoker  . Smokeless tobacco: Never Used  Substance Use Topics  . Alcohol use: No    Alcohol/week: 0.0 standard drinks  . Drug use: No    FAMILY HISTORY: Family History  Problem Relation Age of Onset  . Diabetes Mother   . Liver disease Mother        ESLD- unknown etiology  . Obesity Mother   . Diabetes Maternal Grandmother   . Diabetes Paternal Grandmother   . Breast cancer Paternal Grandmother   . Stroke Maternal Grandfather   . Hyperlipidemia Father     ROS: Review of Systems  Constitutional: Negative for weight loss.  Cardiovascular: Negative for chest pain.  Gastrointestinal: Negative for nausea and vomiting.  Genitourinary: Negative for frequency.  Musculoskeletal:       Negative for muscle weakness  Endo/Heme/Allergies: Negative for polydipsia.       Negative for polyphagia Negative for hypoglycemia Negative for heat or cold intolerance    PHYSICAL EXAM: Blood pressure 139/79, pulse 75, temperature 97.7 F (36.5 C), temperature source Oral, height 5' 9"  (1.753 m), weight (!) 324 lb (147 kg), SpO2 94 %. Body mass index is 47.85 kg/m. Physical Exam  Constitutional: She is oriented to person, place, and time. She appears well-developed and well-nourished.  Cardiovascular: Normal rate.  Pulmonary/Chest: Effort normal.  Musculoskeletal: Normal range of motion.  Neurological: She is oriented to person, place, and time.  Skin: Skin is warm and dry.  Psychiatric: She has a normal mood and affect. Her behavior is normal.  Vitals reviewed.   RECENT LABS  AND TESTS: BMET    Component Value Date/Time   NA 141 09/05/2017 0929   K 4.2 09/05/2017 0929   CL 103 09/05/2017 0929   CO2 24 09/05/2017 0929   GLUCOSE 117 (H) 09/05/2017 0929   GLUCOSE 118 (H) 09/08/2015 1514   BUN 14 09/05/2017 0929   CREATININE 0.71 09/05/2017 0929   CREATININE 0.85 09/08/2015 1514   CALCIUM 9.1 09/05/2017 0929   GFRNONAA 105 09/05/2017 0929   GFRNONAA 85 09/08/2015 1514   GFRAA 121 09/05/2017 0929   GFRAA >89 09/08/2015 1514   Lab Results  Component Value  Date   HGBA1C 5.0 09/05/2017   HGBA1C 5.8 (H) 05/23/2017   HGBA1C 5.7 04/12/2017   HGBA1C 5.8 (H) 05/19/2015   HGBA1C 5.7 06/06/2013   Lab Results  Component Value Date   INSULIN 24.3 09/05/2017   INSULIN 30.4 (H) 05/23/2017   CBC    Component Value Date/Time   WBC 7.2 05/23/2017 1249   WBC 8.3 08/27/2015 1633   RBC 4.66 05/23/2017 1249   RBC 4.27 08/27/2015 1633   HGB 14.7 05/23/2017 1249   HCT 43.3 05/23/2017 1249   PLT 213 04/05/2017 0947   MCV 93 05/23/2017 1249   MCH 31.5 05/23/2017 1249   MCH 31.1 08/27/2015 1633   MCHC 33.9 05/23/2017 1249   MCHC 34.3 08/27/2015 1633   RDW 13.6 05/23/2017 1249   LYMPHSABS 2.8 05/23/2017 1249   MONOABS 0.4 10/27/2013 1604   EOSABS 0.3 05/23/2017 1249   BASOSABS 0.0 05/23/2017 1249   Iron/TIBC/Ferritin/ %Sat    Component Value Date/Time   IRON 95 06/27/2013 1645   TIBC 371 06/27/2013 1645   FERRITIN 401 (H) 06/27/2013 1645   IRONPCTSAT 26 06/27/2013 1645   Lipid Panel     Component Value Date/Time   CHOL 242 (H) 09/05/2017 0929   TRIG 159 (H) 09/05/2017 0929   HDL 72 09/05/2017 0929   CHOLHDL 5.4 (H) 03/30/2017 1509   CHOLHDL 5.0 06/06/2013 1654   VLDL 27 06/06/2013 1654   LDLCALC 138 (H) 09/05/2017 0929   Hepatic Function Panel     Component Value Date/Time   PROT 5.6 (L) 09/05/2017 0929   ALBUMIN 3.4 (L) 09/05/2017 0929   AST 142 (H) 09/05/2017 0929   ALT 212 (H) 09/05/2017 0929   ALKPHOS 277 (H) 09/05/2017 0929   BILITOT  1.3 (H) 09/05/2017 0929   BILIDIR 0.2 04/07/2015 1349   IBILI 1.0 04/07/2015 1349      Component Value Date/Time   TSH 8.710 (H) 09/05/2017 0929   TSH 7.870 (H) 05/23/2017 1249   TSH 6.960 (H) 03/30/2017 1509   Results for SHALLYN, CONSTANCIO (MRN [id number]) as of 11/28/2017 08:42  Ref. Range 09/05/2017 09:29  Vitamin D, 25-Hydroxy Latest Ref Range: 30.0 - 100.0 ng/mL 30.8   ASSESSMENT AND PLAN: Prediabetes - Plan: metFORMIN (GLUCOPHAGE) 500 MG tablet  Other specified hypothyroidism - Plan: levothyroxine (SYNTHROID, LEVOTHROID) 50 MCG tablet  Vitamin D deficiency - Plan: Vitamin D, Ergocalciferol, (DRISDOL) 50000 units CAPS capsule  Essential hypertension - Plan: chlorthalidone (HYGROTON) 25 MG tablet  At risk for diabetes mellitus  Class 3 severe obesity with serious comorbidity and body mass index (BMI) of 45.0 to 49.9 in adult, unspecified obesity type (Melanie [name])  PLAN:  Hypertension We discussed sodium restriction, working on healthy weight loss, and a regular exercise program as the means to achieve improved blood pressure control. Melanie [name] agreed with this plan and agreed to follow up as directed. We will continue to monitor her blood pressure as well as her progress with the above lifestyle modifications. She agrees to continue chlorthalidone 25 mg qd #30 with no refills and will watch for signs of hypotension as she continues her lifestyle modifications.  Pre-Diabetes Melanie [name] will continue to work on weight loss, exercise, and decreasing simple carbohydrates in her diet to help decrease the risk of diabetes. We dicussed metformin including benefits and risks. She was informed that eating too many simple carbohydrates or too many calories at one sitting increases the likelihood of GI side effects. Melanie [name] agreed to continue metformin for now and a  prescription was written today for 1 month refill. We will check labs at the next visit and Melanie [name] agreed to follow up with [place] as directed to monitor her  progress.  Diabetes risk counseling Melanie [name] was given extended (15 minutes) diabetes prevention counseling today. She is 42 y.o. female and has risk factors for diabetes including obesity and prediabetes. We discussed intensive lifestyle modifications today with an emphasis on weight loss as well as increasing exercise and decreasing simple carbohydrates in her diet.  Hypothyroidism Melanie [name] was informed of the importance of good thyroid control to help with weight loss efforts. She was also informed that supertheraputic thyroid levels are dangerous and will not improve weight loss results. Melanie [name] agrees to continue levothyroxine 50 mcg #30 with no refills and follow up as directed.  Vitamin D Deficiency Melanie [name] was informed that low vitamin D levels contributes to fatigue and are associated with obesity, breast, and colon cancer. She agrees to continue to take prescription Vit D @50 ,000 IU every week #4 with no refills and will follow up for routine testing of vitamin D, at least 2-3 times per year. She was informed of the risk of over-replacement of vitamin D and agrees to not increase her dose unless she discusses this with [place] first. Melanie [name] agrees to follow up as directed.  Obesity Melanie [name] is currently in the action stage of change. As such, her goal is to continue with weight loss efforts She has agreed to portion control better and make smarter food choices, such as increase vegetables and decrease simple carbohydrates  Melanie [name] has been instructed to work up to a goal of 150 minutes of combined cardio and strengthening exercise per week for weight loss and overall health benefits. We discussed the following Behavioral Modification Strategies today: better snacking choices and work on meal planning and easy cooking plans  Melanie [name] has agreed to follow up with our clinic in 2 weeks. She was informed of the importance of frequent follow up visits to maximize her success with intensive lifestyle modifications for her  multiple health conditions.   OBESITY BEHAVIORAL INTERVENTION VISIT  Today's visit was # 11   Starting weight: 338 lbs Starting date: 05/23/17 Today's weight : 324 lbs Today's date: 11/26/2017 Total lbs lost to date: 14   ASK: We discussed the diagnosis of obesity with Melanie [name] today and Melanie [name] agreed to give [place] permission to discuss obesity behavioral modification therapy today.  ASSESS: Melanie [name] has the diagnosis of obesity and her BMI today is 49.82 Melanie [name] is in the action stage of change   ADVISE: Melanie [name] was educated on the multiple health risks of obesity as well as the benefit of weight loss to improve her health. She was advised of the need for long term treatment and the importance of lifestyle modifications to improve her current health and to decrease her risk of future health problems.  AGREE: Multiple dietary modification options and treatment options were discussed and  Arshi agreed to follow the recommendations documented in the above note.  ARRANGE: Chalsey was educated on the importance of frequent visits to treat obesity as outlined per CMS and USPSTF guidelines and agreed to schedule her next follow up appointment today.  Corey Skains, am acting as transcriptionist for Abby Potash, PA-C I, Abby Potash, PA-C have reviewed above note and agree with its content

## 2017-12-13 ENCOUNTER — Encounter (INDEPENDENT_AMBULATORY_CARE_PROVIDER_SITE_OTHER): Payer: Self-pay | Admitting: Physician Assistant

## 2017-12-13 ENCOUNTER — Ambulatory Visit (INDEPENDENT_AMBULATORY_CARE_PROVIDER_SITE_OTHER): Payer: BC Managed Care – PPO | Admitting: Physician Assistant

## 2017-12-13 VITALS — BP 114/75 | HR 64 | Temp 98.2°F | Ht 69.0 in | Wt 318.0 lb

## 2017-12-13 DIAGNOSIS — F3289 Other specified depressive episodes: Secondary | ICD-10-CM

## 2017-12-13 DIAGNOSIS — E559 Vitamin D deficiency, unspecified: Secondary | ICD-10-CM

## 2017-12-13 DIAGNOSIS — E785 Hyperlipidemia, unspecified: Secondary | ICD-10-CM

## 2017-12-13 DIAGNOSIS — I10 Essential (primary) hypertension: Secondary | ICD-10-CM

## 2017-12-13 DIAGNOSIS — E039 Hypothyroidism, unspecified: Secondary | ICD-10-CM | POA: Diagnosis not present

## 2017-12-13 DIAGNOSIS — R7303 Prediabetes: Secondary | ICD-10-CM | POA: Diagnosis not present

## 2017-12-13 DIAGNOSIS — Z6841 Body Mass Index (BMI) 40.0 and over, adult: Secondary | ICD-10-CM

## 2017-12-13 DIAGNOSIS — Z9189 Other specified personal risk factors, not elsewhere classified: Secondary | ICD-10-CM | POA: Diagnosis not present

## 2017-12-13 MED ORDER — BUPROPION HCL ER (SR) 150 MG PO TB12: 150.0000 mg | ORAL_TABLET | Freq: Two times a day (BID) | ORAL | 0 refills | Status: DC

## 2017-12-13 MED ORDER — CHLORTHALIDONE 25 MG PO TABS: 25.0000 mg | ORAL_TABLET | Freq: Every day | ORAL | 0 refills | Status: DC

## 2017-12-13 MED ORDER — VITAMIN D (ERGOCALCIFEROL) 1.25 MG (50000 UNIT) PO CAPS: ORAL_CAPSULE | ORAL | 0 refills | Status: DC

## 2017-12-13 MED ORDER — METFORMIN HCL 500 MG PO TABS: 500.0000 mg | ORAL_TABLET | Freq: Every day | ORAL | 0 refills | Status: DC

## 2017-12-13 NOTE — Progress Notes (Signed)
Office: [phone]  /  Fax: [phone]   HPI:   Chief Complaint: OBESITY Melanie [name] is here to discuss her progress with her obesity treatment plan. She is on the portion control better and make smarter food choices and is following her eating plan approximately 100 % of the time. She states she is exercising 0 minutes 0 times per week. Melanie [name] did very well with weight loss. She reports making very good choices while eating out. She will be traveling to see her Mom in a few weeks.  Her weight is (!) 318 lb (144.2 kg) today and has had a weight loss of 6 pounds over a period of 2 weeks since her last visit. She has lost 20 lbs since starting treatment with [place].  Vitamin D deficiency Melanie [name] has a diagnosis of vitamin D deficiency. She is currently taking prescription vit D and denies nausea, vomiting or muscle weakness.  Pre-Diabetes Melanie [name] has a diagnosis of pre-diabetes based on her elevated Hgb A1c and was informed this puts her at greater risk of developing diabetes. She is taking metformin currently and continues to work on diet and exercise to decrease risk of diabetes. She denies polyphagia.  At risk for diabetes Melanie [name] is at higher than average risk for developing diabetes due to her pre-diabetes and obesity.   Hyperlipidemia Melanie [name] has hyperlipidemia and has been trying to improve her cholesterol levels with intensive lifestyle modification including a low saturated fat diet, exercise and weight loss. She has not been on atorvastatin for the last 3 months due to elevated liver enzymes.  Hypothyroid Melanie [name] has a diagnosis of hypothyroidism. She is on levothyroxine 84mg.  Hypertension Melanie SAPPINGTONis a 42y.o. female with hypertension. Melanie [name] chest pain. She is working on weight loss to help control her blood pressure with the goal of decreasing her risk of heart attack and stroke. [name]'s blood pressure is normal.  Depression with emotional eating behaviors LKaitylnis struggling with  emotional eating and using food for comfort to the extent that it is negatively impacting her health. She is not having any cravings and her blood pressure is normal.    ALLERGIES: Allergies  Allergen Reactions  . Lidocaine Other (See Comments)    Flu like sx's  . Relafen [Nabumetone] Other (See Comments)    migraine  . Celebrex [Celecoxib] Rash  . Sulfa Antibiotics Rash    MEDICATIONS: Current Outpatient Medications on File Prior to Visit  Medication Sig Dispense Refill  . busPIRone (BUSPAR) 15 MG tablet TAKE ONE TABLET BY MOUTH TWICE A DAY OR AS DIRECTED 60 tablet 1  . cetirizine (ZYRTEC) 10 MG tablet Take 10 mg by mouth daily.    . citalopram (CELEXA) 20 MG tablet TAKE ONE TABLET BY MOUTH DAILY 90 tablet 3  . fluticasone (FLONASE) 50 MCG/ACT nasal spray Place 2 sprays into both nostrils daily. 48 g 3  . levothyroxine (SYNTHROID, LEVOTHROID) 50 MCG tablet Take 1 tablet (50 mcg total) by mouth daily. 30 tablet 0  . LORazepam (ATIVAN) 1 MG tablet Take 1 tablet (1 mg total) by mouth 2 (two) times daily as needed for anxiety. 20 tablet 0  . montelukast (SINGULAIR) 10 MG tablet Take 1 tablet (10 mg total) by mouth at bedtime. 90 tablet 3  . Multiple Vitamin (MULTIVITAMIN) tablet Take 1 tablet by mouth daily.    . Omega-3 Fatty Acids (FISH OIL PO) Take by mouth daily.     Current Facility-Administered Medications on File Prior to Visit  Medication Dose Route Frequency Provider Last Rate Last Dose  . cefTRIAXone (ROCEPHIN) injection 1 g  1 g Intramuscular Once Rutherford Guys, MD        PAST MEDICAL HISTORY: Past Medical History:  Diagnosis Date  . Allergy   . Anemia   . Anxiety   . Back pain   . Constipation   . Depression   . Fatty liver   . GERD (gastroesophageal reflux disease)   . Hyperlipidemia   . Infertility, female   . Joint pain   . Kidney problem   . Membranous nephrosis   . Multiple food allergies   . Muscle pain   . Numbness and tingling in both hands   .  Pre-diabetes   . Shortness of breath   . Sleep apnea   . Stress   . Swelling of both lower extremities     PAST SURGICAL HISTORY: Past Surgical History:  Procedure Laterality Date  . FINGER SURGERY Left     index finger  . KNEE SURGERY Left   . LIVER BIOPSY N/A 05/15/2014   Procedure: LIVER BIOPSY;  Surgeon: Inda Castle, MD;  Location: [facility];  Service: Endoscopy;  Laterality: N/A;  ultrasound to mark the liver  . TONSILLECTOMY AND ADENOIDECTOMY  1978  . TYMPANOSTOMY TUBE PLACEMENT    . wrist cyst Left   . WRIST SURGERY     left - cyst removed    SOCIAL HISTORY: Social History   Tobacco Use  . Smoking status: Never Smoker  . Smokeless tobacco: Never Used  Substance Use Topics  . Alcohol use: No    Alcohol/week: 0.0 standard drinks  . Drug use: No    FAMILY HISTORY: Family History  Problem Relation Age of Onset  . Diabetes Mother   . Liver disease Mother        ESLD- unknown etiology  . Obesity Mother   . Diabetes Maternal Grandmother   . Diabetes Paternal Grandmother   . Breast cancer Paternal Grandmother   . Stroke Maternal Grandfather   . Hyperlipidemia Father     ROS: Review of Systems  Constitutional: Negative for weight loss.  Cardiovascular: Negative for chest pain.  Gastrointestinal: Negative for nausea and vomiting.  Musculoskeletal:       Negative for muscle weakness.  Endo/Heme/Allergies:       Negative for polyphagia. Negative for cravings.    PHYSICAL EXAM: Blood pressure 114/75, pulse 64, temperature 98.2 F (36.8 C), temperature source Oral, height 5' 9"  (1.753 m), weight (!) 318 lb (144.2 kg), SpO2 98 %. Body mass index is 46.96 kg/m. Physical Exam  Constitutional: She is oriented to person, place, and time. She appears well-developed and well-nourished.  Cardiovascular: Normal rate.  Pulmonary/Chest: Effort normal.  Musculoskeletal: Normal range of motion.  Neurological: She is oriented to person, place, and time.  Skin:  Skin is warm and dry.  Psychiatric: She has a normal mood and affect. Her behavior is normal.  Vitals reviewed.   RECENT LABS AND TESTS: BMET    Component Value Date/Time   NA 141 09/05/2017 0929   K 4.2 09/05/2017 0929   CL 103 09/05/2017 0929   CO2 24 09/05/2017 0929   GLUCOSE 117 (H) 09/05/2017 0929   GLUCOSE 118 (H) 09/08/2015 1514   BUN 14 09/05/2017 0929   CREATININE 0.71 09/05/2017 0929   CREATININE 0.85 09/08/2015 1514   CALCIUM 9.1 09/05/2017 0929   GFRNONAA 105 09/05/2017 0929   GFRNONAA 85 09/08/2015 1514  GFRAA 121 09/05/2017 0929   GFRAA >89 09/08/2015 1514   Lab Results  Component Value Date   HGBA1C 5.0 09/05/2017   HGBA1C 5.8 (H) 05/23/2017   HGBA1C 5.7 04/12/2017   HGBA1C 5.8 (H) 05/19/2015   HGBA1C 5.7 06/06/2013   Lab Results  Component Value Date   INSULIN 24.3 09/05/2017   INSULIN 30.4 (H) 05/23/2017   CBC    Component Value Date/Time   WBC 7.2 05/23/2017 1249   WBC 8.3 08/27/2015 1633   RBC 4.66 05/23/2017 1249   RBC 4.27 08/27/2015 1633   HGB 14.7 05/23/2017 1249   HCT 43.3 05/23/2017 1249   PLT 213 04/05/2017 0947   MCV 93 05/23/2017 1249   MCH 31.5 05/23/2017 1249   MCH 31.1 08/27/2015 1633   MCHC 33.9 05/23/2017 1249   MCHC 34.3 08/27/2015 1633   RDW 13.6 05/23/2017 1249   LYMPHSABS 2.8 05/23/2017 1249   MONOABS 0.4 10/27/2013 1604   EOSABS 0.3 05/23/2017 1249   BASOSABS 0.0 05/23/2017 1249   Iron/TIBC/Ferritin/ %Sat    Component Value Date/Time   IRON 95 06/27/2013 1645   TIBC 371 06/27/2013 1645   FERRITIN 401 (H) 06/27/2013 1645   IRONPCTSAT 26 06/27/2013 1645   Lipid Panel     Component Value Date/Time   CHOL 242 (H) 09/05/2017 0929   TRIG 159 (H) 09/05/2017 0929   HDL 72 09/05/2017 0929   CHOLHDL 5.4 (H) 03/30/2017 1509   CHOLHDL 5.0 06/06/2013 1654   VLDL 27 06/06/2013 1654   LDLCALC 138 (H) 09/05/2017 0929   Hepatic Function Panel     Component Value Date/Time   PROT 5.6 (L) 09/05/2017 0929   ALBUMIN  3.4 (L) 09/05/2017 0929   AST 142 (H) 09/05/2017 0929   ALT 212 (H) 09/05/2017 0929   ALKPHOS 277 (H) 09/05/2017 0929   BILITOT 1.3 (H) 09/05/2017 0929   BILIDIR 0.2 04/07/2015 1349   IBILI 1.0 04/07/2015 1349      Component Value Date/Time   TSH 8.710 (H) 09/05/2017 0929   TSH 7.870 (H) 05/23/2017 1249   TSH 6.960 (H) 03/30/2017 1509   Results for ESTEPHANIE, HUBBS (MRN [id number]) as of 12/13/2017 12:19  Ref. Range 09/05/2017 09:29  Vitamin D, 25-Hydroxy Latest Ref Range: 30.0 - 100.0 ng/mL 30.8    ASSESSMENT AND PLAN: Vitamin D deficiency - Plan: VITAMIN D 25 Hydroxy (Vit-D Deficiency, Fractures), Vitamin D, Ergocalciferol, (DRISDOL) 50000 units CAPS capsule  Prediabetes - Plan: Hemoglobin A1c, Insulin, random, metFORMIN (GLUCOPHAGE) 500 MG tablet  Hyperlipidemia, unspecified hyperlipidemia type - Plan: Lipid panel  Hypothyroidism, unspecified type - Plan: T3, T4, free, TSH  Essential hypertension - Plan: Comprehensive metabolic panel, chlorthalidone (HYGROTON) 25 MG tablet  Other depression - with emotional eating - Plan: buPROPion (WELLBUTRIN SR) 150 MG 12 hr tablet  At risk for diabetes mellitus  Class 3 severe obesity with serious comorbidity and body mass index (BMI) of 45.0 to 49.9 in adult, unspecified obesity type (HCC)  PLAN:  Vitamin D Deficiency Diondra was informed that low vitamin D levels contributes to fatigue and are associated with obesity, breast, and colon cancer. She agrees to continue to take prescription Vit D @50 ,000 IU every week #4 with no refills and will follow up for routine testing of vitamin D, at least 2-3 times per year. She was informed of the risk of over-replacement of vitamin D and agrees to not increase her dose unless she discusses this with [place] first. We will draw labs today. Melanie [name]  agrees to follow up in 2 weeks.  Pre-Diabetes Melanie [name] will continue to work on weight loss, exercise, and decreasing simple carbohydrates in her diet to help decrease  the risk of diabetes.She was informed that eating too many simple carbohydrates or too many calories at one sitting increases the likelihood of GI side effects. Melanie [name] is taking metformin 53m #30 qd with no refills and a prescription was written today. We will check labs today. LAlysagreed to follow up with [place] directed to monitor her progress.  Diabetes risk counseling Melanie [name] given extended (15 minutes) diabetes prevention counseling today. She is 42y.o. female and has risk factors for diabetes including obesity. We discussed intensive lifestyle modifications today with an emphasis on weight loss as well as increasing exercise and decreasing simple carbohydrates in her diet.  Hyperlipidemia Melanie [name] informed of the American Heart Association Guidelines emphasizing intensive lifestyle modifications as the first line treatment for hyperlipidemia. We discussed many lifestyle modifications today in depth, and Melanie [name] continue to work on decreasing saturated fats such as fatty red meat, butter and many fried foods. She will also increase vegetables and lean protein in her diet and continue to work on exercise and weight loss efforts. We will check labs today. Melanie [name] to continue with diet and weight loss. She will follow up as directed.  Hypothyroid LLarsenwas informed of the importance of good thyroid control to help with weight loss efforts. She was also informed that supertherapeutic thyroid levels are dangerous and will not improve weight loss results. We will check labs today. She agrees to continue levothyroxine and will follow up at the agreed time.  Hypertension We discussed sodium restriction, working on healthy weight loss, and a regular exercise program as the means to achieve improved blood pressure control. LBenishaagreed with this plan and agreed to follow up as directed. We will continue to monitor her blood pressure as well as her progress with the above lifestyle modifications. She  will continue her medications as prescribed and will watch for signs of hypotension as she continues her lifestyle modifications. She agrees to continue chlorthalidone 278mqd #30 with no refills and will follow up in 2 weeks.  Depression with Emotional Eating Behaviors We discussed behavior modification techniques today to help LiDestyneeal with her emotional eating and depression. She has agreed to take bupropion SR 150 mg BID # 60 with no refills and agreed to follow up as directed in 2 weeks.  Obesity Melanie [name] currently in the action stage of change. As such, her goal is to continue with weight loss efforts. She has agreed to portion control better and make smarter food choices, such as increase vegetables and decrease simple carbohydrates.  LiWalidaas been instructed to work up to a goal of 150 minutes of combined cardio and strengthening exercise per week for weight loss and overall health benefits. We discussed the following Behavioral Modification Strategies today: work on meal planning and easy cooking plans and travel eating strategies.  LiJessieas agreed to follow up with our clinic in 2 weeks. She was informed of the importance of frequent follow up visits to maximize her success with intensive lifestyle modifications for her multiple health conditions.   OBESITY BEHAVIORAL INTERVENTION VISIT  Today's visit was # 12   Starting weight: 338 lbs Starting date: 05/23/17 Today's weight : Weight: (!) 318 lb (144.2 kg)  Today's date: 12/13/2017 Total lbs lost to date: 20  ASK: We discussed the diagnosis of  obesity with [name] today and Maeleigh agreed to give [place] permission to discuss obesity behavioral modification therapy today.  ASSESS: Aila has the diagnosis of obesity and her BMI today is 46.94. Ahriyah is in the action stage of change.   ADVISE: Zriyah was educated on the multiple health risks of obesity as well as the benefit of weight loss to improve her health. She was advised of  the need for long term treatment and the importance of lifestyle modifications to improve her current health and to decrease her risk of future health problems.  AGREE: Multiple dietary modification options and treatment options were discussed and Karma agreed to follow the recommendations documented in the above note.  ARRANGE: Dorianna was educated on the importance of frequent visits to treat obesity as outlined per CMS and USPSTF guidelines and agreed to schedule her next follow up appointment today.  Lenward Chancellor, am acting as transcriptionist for Abby Potash, PA-C I, Abby Potash, PA-C have reviewed above note and agree with its content

## 2017-12-14 LAB — VITAMIN D 25 HYDROXY (VIT D DEFICIENCY, FRACTURES): Vit D, 25-Hydroxy: 29.3 ng/mL — ABNORMAL LOW (ref 30.0–100.0)

## 2017-12-14 LAB — INSULIN, RANDOM: INSULIN: 39 u[IU]/mL — ABNORMAL HIGH (ref 2.6–24.9)

## 2017-12-14 LAB — LIPID PANEL
Chol/HDL Ratio: 6.6 ratio — ABNORMAL HIGH (ref 0.0–4.4)
Cholesterol, Total: 383 mg/dL — ABNORMAL HIGH (ref 100–199)
HDL: 58 mg/dL (ref >39)
LDL CALC: 279 mg/dL — AB (ref 0–99)
TRIGLYCERIDES: 229 mg/dL — AB (ref 0–149)
VLDL CHOLESTEROL CAL: 46 mg/dL — AB (ref 5–40)

## 2017-12-14 LAB — HEMOGLOBIN A1C
Est. average glucose Bld gHb Est-mCnc: 114 mg/dL
Hgb A1c MFr Bld: 5.6 % (ref 4.8–5.6)

## 2017-12-14 LAB — COMPREHENSIVE METABOLIC PANEL
A/G RATIO: 1.7 (ref 1.2–2.2)
ALBUMIN: 3.7 g/dL (ref 3.5–5.5)
ALT: 60 IU/L — ABNORMAL HIGH (ref 0–32)
AST: 41 IU/L — ABNORMAL HIGH (ref 0–40)
Alkaline Phosphatase: 139 IU/L — ABNORMAL HIGH (ref 39–117)
BUN / CREAT RATIO: 19 (ref 9–23)
BUN: 17 mg/dL (ref 6–24)
Bilirubin Total: 0.8 mg/dL (ref 0.0–1.2)
CALCIUM: 9.9 mg/dL (ref 8.7–10.2)
CO2: 28 mmol/L (ref 20–29)
CREATININE: 0.89 mg/dL (ref 0.57–1.00)
Chloride: 99 mmol/L (ref 96–106)
GFR, EST AFRICAN AMERICAN: 92 mL/min/{1.73_m2} (ref >59)
GFR, EST NON AFRICAN AMERICAN: 80 mL/min/{1.73_m2} (ref >59)
GLOBULIN, TOTAL: 2.2 g/dL (ref 1.5–4.5)
Glucose: 124 mg/dL — ABNORMAL HIGH (ref 65–99)
Potassium: 4 mmol/L (ref 3.5–5.2)
Sodium: 140 mmol/L (ref 134–144)
TOTAL PROTEIN: 5.9 g/dL — AB (ref 6.0–8.5)

## 2017-12-14 LAB — T3: T3, Total: 109 ng/dL (ref 71–180)

## 2017-12-14 LAB — TSH: TSH: 5.57 u[IU]/mL — ABNORMAL HIGH (ref 0.450–4.500)

## 2017-12-14 LAB — T4, FREE: Free T4: 1.19 ng/dL (ref 0.82–1.77)

## 2017-12-21 ENCOUNTER — Other Ambulatory Visit (INDEPENDENT_AMBULATORY_CARE_PROVIDER_SITE_OTHER): Payer: Self-pay | Admitting: Physician Assistant

## 2017-12-21 DIAGNOSIS — E559 Vitamin D deficiency, unspecified: Secondary | ICD-10-CM

## 2017-12-23 ENCOUNTER — Other Ambulatory Visit (INDEPENDENT_AMBULATORY_CARE_PROVIDER_SITE_OTHER): Payer: Self-pay | Admitting: Physician Assistant

## 2017-12-23 DIAGNOSIS — I10 Essential (primary) hypertension: Secondary | ICD-10-CM

## 2017-12-23 DIAGNOSIS — R7303 Prediabetes: Secondary | ICD-10-CM

## 2017-12-23 DIAGNOSIS — E038 Other specified hypothyroidism: Secondary | ICD-10-CM

## 2017-12-31 ENCOUNTER — Ambulatory Visit (INDEPENDENT_AMBULATORY_CARE_PROVIDER_SITE_OTHER): Payer: BC Managed Care – PPO | Admitting: Physician Assistant

## 2017-12-31 VITALS — BP 109/51 | HR 80 | Temp 98.4°F | Ht 69.0 in | Wt 320.0 lb

## 2017-12-31 DIAGNOSIS — Z9189 Other specified personal risk factors, not elsewhere classified: Secondary | ICD-10-CM

## 2017-12-31 DIAGNOSIS — E7849 Other hyperlipidemia: Secondary | ICD-10-CM

## 2017-12-31 DIAGNOSIS — E038 Other specified hypothyroidism: Secondary | ICD-10-CM

## 2017-12-31 DIAGNOSIS — E559 Vitamin D deficiency, unspecified: Secondary | ICD-10-CM | POA: Diagnosis not present

## 2017-12-31 DIAGNOSIS — Z6841 Body Mass Index (BMI) 40.0 and over, adult: Secondary | ICD-10-CM

## 2017-12-31 MED ORDER — VITAMIN D (ERGOCALCIFEROL) 1.25 MG (50000 UNIT) PO CAPS: ORAL_CAPSULE | ORAL | 0 refills | Status: DC

## 2017-12-31 MED ORDER — LEVOTHYROXINE SODIUM 75 MCG PO TABS: 75.0000 ug | ORAL_TABLET | Freq: Every day | ORAL | 0 refills | Status: DC

## 2018-01-02 NOTE — Progress Notes (Signed)
Office: [phone]  /  Fax: [phone]   HPI:   Chief Complaint: OBESITY Melanie [name] is here to discuss her progress with her obesity treatment plan. She is on the portion control better and make smarter food choices, such as increase vegetables and decrease simple carbohydrates and is following her eating plan approximately 80 % of the time. She states she is exercising 0 minutes 0 times per week. Melanie [name] reports that she did her best to follow the plan while under a lot of stress and is doing a lot of travel. She is going to see her mom for 2 weeks in a few days.  Her weight is (!) 320 lb (145.2 kg) today and has had a weight gain of 2 pounds over a period of 2 weeks since her last visit. She has lost 18 lbs since starting treatment with [place].  Hypothyroid Melanie [name] has a diagnosis of hypothyroidism. She is on levothyroxine 50 mcg. She denies nausea, vomiting, and muscle weakness.  Vitamin D deficiency Melanie [name] has a diagnosis of vitamin D deficiency. She is currently taking vit D and denies nausea, vomiting or muscle weakness.  At risk for osteopenia and osteoporosis Melanie [name] is at higher risk of osteopenia and osteoporosis due to vitamin D deficiency.   Hyperlipidemia Melanie [name] has hyperlipidemia and has been trying to improve her cholesterol levels with intensive lifestyle modification including a low saturated fat diet, exercise and weight loss. She has a history of non-alcoholic steatohepatitis. Her elevated liver enzymes are much improved since discontinuing the statin.  ALLERGIES: Allergies  Allergen Reactions  . Lidocaine Other (See Comments)    Flu like sx's  . Relafen [Nabumetone] Other (See Comments)    migraine  . Celebrex [Celecoxib] Rash  . Sulfa Antibiotics Rash    MEDICATIONS: Current Outpatient Medications on File Prior to Visit  Medication Sig Dispense Refill  . buPROPion (WELLBUTRIN SR) 150 MG 12 hr tablet Take 1 tablet (150 mg total) by mouth 2 (two) times daily. (Patient taking  differently: Take 300 mg by mouth 2 (two) times daily. ) 60 tablet 0  . busPIRone (BUSPAR) 15 MG tablet TAKE ONE TABLET BY MOUTH TWICE A DAY OR AS DIRECTED 60 tablet 1  . cetirizine (ZYRTEC) 10 MG tablet Take 10 mg by mouth daily.    . chlorthalidone (HYGROTON) 25 MG tablet Take 1 tablet (25 mg total) by mouth daily. 30 tablet 0  . citalopram (CELEXA) 20 MG tablet TAKE ONE TABLET BY MOUTH DAILY 90 tablet 3  . fluticasone (FLONASE) 50 MCG/ACT nasal spray Place 2 sprays into both nostrils daily. 48 g 3  . LORazepam (ATIVAN) 1 MG tablet Take 1 tablet (1 mg total) by mouth 2 (two) times daily as needed for anxiety. 20 tablet 0  . metFORMIN (GLUCOPHAGE) 500 MG tablet Take 1 tablet (500 mg total) by mouth daily with breakfast. 30 tablet 0  . montelukast (SINGULAIR) 10 MG tablet Take 1 tablet (10 mg total) by mouth at bedtime. 90 tablet 3  . Multiple Vitamin (MULTIVITAMIN) tablet Take 1 tablet by mouth daily.    . Omega-3 Fatty Acids (FISH OIL PO) Take by mouth daily.     Current Facility-Administered Medications on File Prior to Visit  Medication Dose Route Frequency Provider Last Rate Last Dose  . cefTRIAXone (ROCEPHIN) injection 1 g  1 g Intramuscular Once Rutherford Guys, MD        PAST MEDICAL HISTORY: Past Medical History:  Diagnosis Date  . Allergy   . Anemia   .  Anxiety   . Back pain   . Constipation   . Depression   . Fatty liver   . GERD (gastroesophageal reflux disease)   . Hyperlipidemia   . Infertility, female   . Joint pain   . Kidney problem   . Membranous nephrosis   . Multiple food allergies   . Muscle pain   . Numbness and tingling in both hands   . Pre-diabetes   . Shortness of breath   . Sleep apnea   . Stress   . Swelling of both lower extremities     PAST SURGICAL HISTORY: Past Surgical History:  Procedure Laterality Date  . FINGER SURGERY Left     index finger  . KNEE SURGERY Left   . LIVER BIOPSY N/A 05/15/2014   Procedure: LIVER BIOPSY;  Surgeon:  Inda Castle, MD;  Location: [facility];  Service: Endoscopy;  Laterality: N/A;  ultrasound to mark the liver  . TONSILLECTOMY AND ADENOIDECTOMY  1978  . TYMPANOSTOMY TUBE PLACEMENT    . wrist cyst Left   . WRIST SURGERY     left - cyst removed    SOCIAL HISTORY: Social History   Tobacco Use  . Smoking status: Never Smoker  . Smokeless tobacco: Never Used  Substance Use Topics  . Alcohol use: No    Alcohol/week: 0.0 standard drinks  . Drug use: No    FAMILY HISTORY: Family History  Problem Relation Age of Onset  . Diabetes Mother   . Liver disease Mother        ESLD- unknown etiology  . Obesity Mother   . Diabetes Maternal Grandmother   . Diabetes Paternal Grandmother   . Breast cancer Paternal Grandmother   . Stroke Maternal Grandfather   . Hyperlipidemia Father     ROS: Review of Systems  Constitutional: Negative for weight loss.  Gastrointestinal: Negative for nausea and vomiting.  Musculoskeletal:       Negative for muscle weakness.    PHYSICAL EXAM: Blood pressure (!) 109/51, pulse 80, temperature 98.4 F (36.9 C), temperature source Oral, height 5' 9"  (1.753 m), weight (!) 320 lb (145.2 kg), SpO2 94 %. Body mass index is 47.26 kg/m. Physical Exam  Constitutional: She is oriented to person, place, and time. She appears well-developed and well-nourished.  Cardiovascular: Normal rate.  Pulmonary/Chest: Effort normal.  Musculoskeletal: Normal range of motion.  Neurological: She is oriented to person, place, and time.  Skin: Skin is warm and dry.  Psychiatric: She has a normal mood and affect. Her behavior is normal.  Vitals reviewed.   RECENT LABS AND TESTS: BMET    Component Value Date/Time   NA 140 12/13/2017 1014   K 4.0 12/13/2017 1014   CL 99 12/13/2017 1014   CO2 28 12/13/2017 1014   GLUCOSE 124 (H) 12/13/2017 1014   GLUCOSE 118 (H) 09/08/2015 1514   BUN 17 12/13/2017 1014   CREATININE 0.89 12/13/2017 1014   CREATININE 0.85 09/08/2015  1514   CALCIUM 9.9 12/13/2017 1014   GFRNONAA 80 12/13/2017 1014   GFRNONAA 85 09/08/2015 1514   GFRAA 92 12/13/2017 1014   GFRAA >89 09/08/2015 1514   Lab Results  Component Value Date   HGBA1C 5.6 12/13/2017   HGBA1C 5.0 09/05/2017   HGBA1C 5.8 (H) 05/23/2017   HGBA1C 5.7 04/12/2017   HGBA1C 5.8 (H) 05/19/2015   Lab Results  Component Value Date   INSULIN 39.0 (H) 12/13/2017   INSULIN 24.3 09/05/2017   INSULIN 30.4 (H)  05/23/2017   CBC    Component Value Date/Time   WBC 7.2 05/23/2017 1249   WBC 8.3 08/27/2015 1633   RBC 4.66 05/23/2017 1249   RBC 4.27 08/27/2015 1633   HGB 14.7 05/23/2017 1249   HCT 43.3 05/23/2017 1249   PLT 213 04/05/2017 0947   MCV 93 05/23/2017 1249   MCH 31.5 05/23/2017 1249   MCH 31.1 08/27/2015 1633   MCHC 33.9 05/23/2017 1249   MCHC 34.3 08/27/2015 1633   RDW 13.6 05/23/2017 1249   LYMPHSABS 2.8 05/23/2017 1249   MONOABS 0.4 10/27/2013 1604   EOSABS 0.3 05/23/2017 1249   BASOSABS 0.0 05/23/2017 1249   Iron/TIBC/Ferritin/ %Sat    Component Value Date/Time   IRON 95 06/27/2013 1645   TIBC 371 06/27/2013 1645   FERRITIN 401 (H) 06/27/2013 1645   IRONPCTSAT 26 06/27/2013 1645   Lipid Panel     Component Value Date/Time   CHOL 383 (H) 12/13/2017 1014   TRIG 229 (H) 12/13/2017 1014   HDL 58 12/13/2017 1014   CHOLHDL 6.6 (H) 12/13/2017 1014   CHOLHDL 5.0 06/06/2013 1654   VLDL 27 06/06/2013 1654   LDLCALC 279 (H) 12/13/2017 1014   Hepatic Function Panel     Component Value Date/Time   PROT 5.9 (L) 12/13/2017 1014   ALBUMIN 3.7 12/13/2017 1014   AST 41 (H) 12/13/2017 1014   ALT 60 (H) 12/13/2017 1014   ALKPHOS 139 (H) 12/13/2017 1014   BILITOT 0.8 12/13/2017 1014   BILIDIR 0.2 04/07/2015 1349   IBILI 1.0 04/07/2015 1349      Component Value Date/Time   TSH 5.570 (H) 12/13/2017 1014   TSH 8.710 (H) 09/05/2017 0929   TSH 7.870 (H) 05/23/2017 1249   Results for KYLENE, ZAMARRON (MRN [id number]) as of 01/02/2018 11:33  Ref.  Range 12/13/2017 10:14  Vitamin D, 25-Hydroxy Latest Ref Range: 30.0 - 100.0 ng/mL 29.3 (L)   ASSESSMENT AND PLAN: Other hyperlipidemia  Other specified hypothyroidism - Plan: levothyroxine (SYNTHROID, LEVOTHROID) 75 MCG tablet  Vitamin D deficiency - Plan: Vitamin D, Ergocalciferol, (DRISDOL) 50000 units CAPS capsule  At risk for osteoporosis  Class 3 severe obesity with serious comorbidity and body mass index (BMI) of 45.0 to 49.9 in adult, unspecified obesity type ([place])  PLAN:  Hypothyroid Melanie [name] was informed of the importance of good thyroid control to help with weight loss efforts. She was also informed that supertherapeutic thyroid levels are dangerous and will not improve weight loss results. Melanie [name] agrees to increase her dose of levothyroxine 26m,1 tablet PO daily #30 with no refills. We will recheck labs in 6 weeks.  Vitamin D Deficiency LHazeleewas informed that low vitamin D levels contributes to fatigue and are associated with obesity, breast, and colon cancer. She agrees to continue to increase her dose of  prescription Vit D @50 ,000 IU twice weekly #10 with no refills and will follow up for routine testing of vitamin D, at least 2-3 times per year. She was informed of the risk of over-replacement of vitamin D and agrees to not increase her dose unless she discusses this with [facility]. LBethaneyagrees to follow up in 3 weeks.  At risk for osteopenia and osteoporosis LYukariwas given extended (15 minutes) osteoporosis prevention counseling today. Melanie [name] at risk for osteopenia and osteoporosis due to her vitamin D deficiency. She was encouraged to take her vitamin D and follow her higher calcium diet and increase strengthening exercise to help strengthen her bones and decrease  her risk of osteopenia and osteoporosis.  Hyperlipidemia Melanie [name] was informed of the American Heart Association Guidelines emphasizing intensive lifestyle modifications as the first line treatment for hyperlipidemia. We  discussed many lifestyle modifications today in depth, and Melanie [name] will continue to work on decreasing saturated fats such as fatty red meat, butter and many fried foods. She will also increase vegetables and lean protein in her diet and continue to work on exercise and weight loss efforts. Melanie [name] agrees to follow up with her PCP for management.  Obesity Melanie [name] is currently in the action stage of change. As such, her goal is to continue with weight loss efforts.. She has agreed to portion control better and make smarter food choices, such as increase vegetables and decrease simple carbohydrates. Issabella has been instructed to work up to a goal of 150 minutes of combined cardio and strengthening exercise per week for weight loss and overall health benefits. We discussed the following Behavioral Modification Strategies today: increasing lean protein intake, decreasing simple carbohydrates, work on meal planning and easy cooking plans, and travel eating strategies.  Melanie [name] has agreed to follow up with our clinic in 3 weeks. She was informed of the importance of frequent follow up visits to maximize her success with intensive lifestyle modifications for her multiple health conditions.   OBESITY BEHAVIORAL INTERVENTION VISIT  Today's visit was # 12   Starting weight: 338 lbs Starting date: 05/23/17 Today's weight : Weight: (!) 320 lb (145.2 kg)  Today's date: 12/31/2017 Total lbs lost to date: 72  ASK: We discussed the diagnosis of obesity with Melanie [name] today and Petrice agreed to give [place] permission to discuss obesity behavioral modification therapy today.  ASSESS: Darthy has the diagnosis of obesity and her BMI today is 47.23 Alyviah is in the action stage of change.   ADVISE: Owen was educated on the multiple health risks of obesity as well as the benefit of weight loss to improve her health. She was advised of the need for long term treatment and the importance of lifestyle modifications to improve her  current health and to decrease her risk of future health problems.  AGREE: Multiple dietary modification options and treatment options were discussed and Jeda agreed to follow the recommendations documented in the above note.  ARRANGE: Michale was educated on the importance of frequent visits to treat obesity as outlined per CMS and USPSTF guidelines and agreed to schedule her next follow up appointment today.  Lenward Chancellor, am acting as transcriptionist for Abby Potash, PA-C I, Abby Potash, PA-C have reviewed above note and agree with its content

## 2018-01-08 ENCOUNTER — Other Ambulatory Visit (INDEPENDENT_AMBULATORY_CARE_PROVIDER_SITE_OTHER): Payer: Self-pay | Admitting: Physician Assistant

## 2018-01-08 DIAGNOSIS — F3289 Other specified depressive episodes: Secondary | ICD-10-CM

## 2018-01-10 ENCOUNTER — Other Ambulatory Visit (INDEPENDENT_AMBULATORY_CARE_PROVIDER_SITE_OTHER): Payer: Self-pay | Admitting: Physician Assistant

## 2018-01-10 DIAGNOSIS — E559 Vitamin D deficiency, unspecified: Secondary | ICD-10-CM

## 2018-01-15 ENCOUNTER — Other Ambulatory Visit (INDEPENDENT_AMBULATORY_CARE_PROVIDER_SITE_OTHER): Payer: Self-pay | Admitting: Physician Assistant

## 2018-01-15 DIAGNOSIS — I10 Essential (primary) hypertension: Secondary | ICD-10-CM

## 2018-01-15 DIAGNOSIS — R7303 Prediabetes: Secondary | ICD-10-CM

## 2018-01-17 ENCOUNTER — Other Ambulatory Visit: Payer: Self-pay | Admitting: Family Medicine

## 2018-01-17 DIAGNOSIS — F418 Other specified anxiety disorders: Secondary | ICD-10-CM

## 2018-01-17 DIAGNOSIS — F4323 Adjustment disorder with mixed anxiety and depressed mood: Secondary | ICD-10-CM

## 2018-01-17 NOTE — Telephone Encounter (Signed)
Requested medication (s) are due for refill today: yes  Requested medication (s) are on the active medication list: yes  Last refill: 12/19/17  Future visit scheduled: no  Notes to clinic:  Not delegated    Requested Prescriptions  Pending Prescriptions Disp Refills   busPIRone (BUSPAR) 15 MG tablet [Pharmacy Med Name: busPIRone HCL 15 MG TABLET] 60 tablet 0    Sig: TAKE ONE TABLET BY MOUTH TWICE A DAY OR AS DIRECTED     Not Delegated - Psychiatry:  Anxiolytics/Hypnotics Failed - 01/17/2018  6:00 AM      Failed - This refill cannot be delegated      Failed - Urine Drug Screen completed in last 360 days.      Failed - Valid encounter within last 6 months    Recent Outpatient Visits          8 months ago Foot sprain, left, initial encounter   [facility] at [place], [place], [place]   9 months ago Acute pyelonephritis   [facility] [name], [place]   9 months ago Acute pyelonephritis   [facility], [place], [place]   9 months ago Annual physical exam   Primary Care at [facility], [place], [place]   11 months ago Cough   Primary Care at [facility], [place], [place]

## 2018-01-18 NOTE — Telephone Encounter (Signed)
Patient is requesting a refill of the following medications: Requested Prescriptions   Pending Prescriptions Disp Refills  . busPIRone (BUSPAR) 15 MG tablet [Pharmacy Med Name: busPIRone HCL 15 MG TABLET] 60 tablet 0    Sig: TAKE ONE TABLET BY MOUTH TWICE A DAY OR AS DIRECTED

## 2018-01-21 ENCOUNTER — Ambulatory Visit (INDEPENDENT_AMBULATORY_CARE_PROVIDER_SITE_OTHER): Payer: BC Managed Care – PPO | Admitting: Physician Assistant

## 2018-01-21 ENCOUNTER — Encounter (INDEPENDENT_AMBULATORY_CARE_PROVIDER_SITE_OTHER): Payer: Self-pay | Admitting: Physician Assistant

## 2018-01-21 VITALS — BP 132/75 | HR 71 | Temp 98.4°F | Ht 69.0 in | Wt 323.0 lb

## 2018-01-21 DIAGNOSIS — F3289 Other specified depressive episodes: Secondary | ICD-10-CM

## 2018-01-21 DIAGNOSIS — E559 Vitamin D deficiency, unspecified: Secondary | ICD-10-CM

## 2018-01-21 DIAGNOSIS — Z6841 Body Mass Index (BMI) 40.0 and over, adult: Secondary | ICD-10-CM

## 2018-01-21 DIAGNOSIS — I10 Essential (primary) hypertension: Secondary | ICD-10-CM | POA: Diagnosis not present

## 2018-01-21 DIAGNOSIS — Z9189 Other specified personal risk factors, not elsewhere classified: Secondary | ICD-10-CM | POA: Diagnosis not present

## 2018-01-22 MED ORDER — BUPROPION HCL ER (SR) 150 MG PO TB12: 150.0000 mg | ORAL_TABLET | Freq: Two times a day (BID) | ORAL | 0 refills | Status: DC

## 2018-01-22 MED ORDER — VITAMIN D (ERGOCALCIFEROL) 1.25 MG (50000 UNIT) PO CAPS: ORAL_CAPSULE | ORAL | 0 refills | Status: DC

## 2018-01-22 MED ORDER — CHLORTHALIDONE 25 MG PO TABS: 25.0000 mg | ORAL_TABLET | Freq: Every day | ORAL | 0 refills | Status: DC

## 2018-01-22 NOTE — Progress Notes (Signed)
Office: [phone]  /  Fax: [phone]   HPI:   Chief Complaint: OBESITY Melanie [name] is here to discuss her progress with her obesity treatment plan. She is on the portion control better and make smarter food choices, such as increase vegetables and decrease simple carbohydrates and is following her eating plan approximately 50 % of the time. She states she is exercising 0 minutes 0 times per week. Melanie [name] just returned from visiting her mother. She reports doing the best she could with her plan with the food she had available. She is ready to get back on a Category plan.  Her weight is (!) 323 lb (146.5 kg) today and has had a weight gain of 3 pounds over a period of 3 weeks since her last visit. She has lost 15 lbs since starting treatment with [place].  Vitamin D deficiency Melanie [name] has a diagnosis of vitamin D deficiency. She is currently taking vit D and denies nausea, vomiting, or muscle weakness.  At risk for osteopenia and osteoporosis Melanie [name] is at higher risk of osteopenia and osteoporosis due to vitamin D deficiency.   Hypertension Melanie [name] is a 42 y.o. female with hypertension. She is working on weight loss to help control her blood pressure with the goal of decreasing her risk of heart attack and stroke. Melanie [name]'s blood pressure is normal today. Melanie [name] denies chest pain.  Depression with emotional eating behaviors Melanie [name] is struggling with emotional eating and using food for comfort to the extent that it is negatively impacting her health. She reports some emotional eating. She often snacks when she is not hungry. She shows no sign of suicidal or homicidal ideations.  ALLERGIES: Allergies  Allergen Reactions  . Lidocaine Other (See Comments)    Flu like sx's  . Relafen [Nabumetone] Other (See Comments)    migraine  . Celebrex [Celecoxib] Rash  . Sulfa Antibiotics Rash    MEDICATIONS: Current Outpatient Medications on File Prior to Visit  Medication Sig Dispense Refill  . buPROPion  (WELLBUTRIN SR) 150 MG 12 hr tablet TAKE ONE TABLET BY MOUTH TWICE A DAY 60 tablet 0  . busPIRone (BUSPAR) 15 MG tablet TAKE ONE TABLET BY MOUTH TWICE A DAY OR AS DIRECTED 60 tablet 1  . cetirizine (ZYRTEC) 10 MG tablet Take 10 mg by mouth daily.    . chlorthalidone (HYGROTON) 25 MG tablet TAKE ONE TABLET BY MOUTH DAILY 30 tablet 0  . citalopram (CELEXA) 20 MG tablet TAKE ONE TABLET BY MOUTH DAILY 90 tablet 3  . fluticasone (FLONASE) 50 MCG/ACT nasal spray Place 2 sprays into both nostrils daily. 48 g 3  . levothyroxine (SYNTHROID, LEVOTHROID) 75 MCG tablet Take 1 tablet (75 mcg total) by mouth daily. 30 tablet 0  . LORazepam (ATIVAN) 1 MG tablet Take 1 tablet (1 mg total) by mouth 2 (two) times daily as needed for anxiety. 20 tablet 0  . metFORMIN (GLUCOPHAGE) 500 MG tablet TAKE ONE TABLET BY MOUTH DAILY WITH BREAKFAST 30 tablet 0  . montelukast (SINGULAIR) 10 MG tablet Take 1 tablet (10 mg total) by mouth at bedtime. 90 tablet 3  . Multiple Vitamin (MULTIVITAMIN) tablet Take 1 tablet by mouth daily.    . Omega-3 Fatty Acids (FISH OIL PO) Take by mouth daily.    . Vitamin D, Ergocalciferol, (DRISDOL) 50000 units CAPS capsule TAKE ONE CAPSULE BY MOUTH TWICE WEEKLY 10 capsule 0   Current Facility-Administered Medications on File Prior to Visit  Medication Dose Route Frequency Provider Last Rate Last  Dose  . cefTRIAXone (ROCEPHIN) injection 1 g  1 g Intramuscular Once Rutherford Guys, MD        PAST MEDICAL HISTORY: Past Medical History:  Diagnosis Date  . Allergy   . Anemia   . Anxiety   . Back pain   . Constipation   . Depression   . Fatty liver   . GERD (gastroesophageal reflux disease)   . Hyperlipidemia   . Infertility, female   . Joint pain   . Kidney problem   . Membranous nephrosis   . Multiple food allergies   . Muscle pain   . Numbness and tingling in both hands   . Pre-diabetes   . Shortness of breath   . Sleep apnea   . Stress   . Swelling of both lower  extremities     PAST SURGICAL HISTORY: Past Surgical History:  Procedure Laterality Date  . FINGER SURGERY Left     index finger  . KNEE SURGERY Left   . LIVER BIOPSY N/A 05/15/2014   Procedure: LIVER BIOPSY;  Surgeon: Inda Castle, MD;  Location: [facility];  Service: Endoscopy;  Laterality: N/A;  ultrasound to mark the liver  . TONSILLECTOMY AND ADENOIDECTOMY  1978  . TYMPANOSTOMY TUBE PLACEMENT    . wrist cyst Left   . WRIST SURGERY     left - cyst removed    SOCIAL HISTORY: Social History   Tobacco Use  . Smoking status: Never Smoker  . Smokeless tobacco: Never Used  Substance Use Topics  . Alcohol use: No    Alcohol/week: 0.0 standard drinks  . Drug use: No    FAMILY HISTORY: Family History  Problem Relation Age of Onset  . Diabetes Mother   . Liver disease Mother        ESLD- unknown etiology  . Obesity Mother   . Diabetes Maternal Grandmother   . Diabetes Paternal Grandmother   . Breast cancer Paternal Grandmother   . Stroke Maternal Grandfather   . Hyperlipidemia Father     ROS: Review of Systems  Constitutional: Negative for weight loss.  Cardiovascular: Negative for chest pain.  Gastrointestinal: Negative for nausea and vomiting.  Musculoskeletal:       Negative for muscle weakness.  Psychiatric/Behavioral: Positive for depression. Negative for suicidal ideas.       Negative for homicidal ideations.    PHYSICAL EXAM: Blood pressure 132/75, pulse 71, temperature 98.4 F (36.9 C), temperature source Oral, height 5' 9"  (1.753 m), weight (!) 323 lb (146.5 kg), SpO2 96 %. Body mass index is 47.7 kg/m. Physical Exam  Constitutional: She is oriented to person, place, and time. She appears well-developed and well-nourished.  Cardiovascular: Normal rate.  Pulmonary/Chest: Effort normal.  Musculoskeletal: Normal range of motion.  Neurological: She is oriented to person, place, and time.  Skin: Skin is warm and dry.  Psychiatric: She has a normal  mood and affect. Her behavior is normal.  Vitals reviewed.   RECENT LABS AND TESTS: BMET    Component Value Date/Time   NA 140 12/13/2017 1014   K 4.0 12/13/2017 1014   CL 99 12/13/2017 1014   CO2 28 12/13/2017 1014   GLUCOSE 124 (H) 12/13/2017 1014   GLUCOSE 118 (H) 09/08/2015 1514   BUN 17 12/13/2017 1014   CREATININE 0.89 12/13/2017 1014   CREATININE 0.85 09/08/2015 1514   CALCIUM 9.9 12/13/2017 1014   GFRNONAA 80 12/13/2017 1014   GFRNONAA 85 09/08/2015 1514   GFRAA 92  12/13/2017 1014   GFRAA >89 09/08/2015 1514   Lab Results  Component Value Date   HGBA1C 5.6 12/13/2017   HGBA1C 5.0 09/05/2017   HGBA1C 5.8 (H) 05/23/2017   HGBA1C 5.7 04/12/2017   HGBA1C 5.8 (H) 05/19/2015   Lab Results  Component Value Date   INSULIN 39.0 (H) 12/13/2017   INSULIN 24.3 09/05/2017   INSULIN 30.4 (H) 05/23/2017   CBC    Component Value Date/Time   WBC 7.2 05/23/2017 1249   WBC 8.3 08/27/2015 1633   RBC 4.66 05/23/2017 1249   RBC 4.27 08/27/2015 1633   HGB 14.7 05/23/2017 1249   HCT 43.3 05/23/2017 1249   PLT 213 04/05/2017 0947   MCV 93 05/23/2017 1249   MCH 31.5 05/23/2017 1249   MCH 31.1 08/27/2015 1633   MCHC 33.9 05/23/2017 1249   MCHC 34.3 08/27/2015 1633   RDW 13.6 05/23/2017 1249   LYMPHSABS 2.8 05/23/2017 1249   MONOABS 0.4 10/27/2013 1604   EOSABS 0.3 05/23/2017 1249   BASOSABS 0.0 05/23/2017 1249   Iron/TIBC/Ferritin/ %Sat    Component Value Date/Time   IRON 95 06/27/2013 1645   TIBC 371 06/27/2013 1645   FERRITIN 401 (H) 06/27/2013 1645   IRONPCTSAT 26 06/27/2013 1645   Lipid Panel     Component Value Date/Time   CHOL 383 (H) 12/13/2017 1014   TRIG 229 (H) 12/13/2017 1014   HDL 58 12/13/2017 1014   CHOLHDL 6.6 (H) 12/13/2017 1014   CHOLHDL 5.0 06/06/2013 1654   VLDL 27 06/06/2013 1654   LDLCALC 279 (H) 12/13/2017 1014   Hepatic Function Panel     Component Value Date/Time   PROT 5.9 (L) 12/13/2017 1014   ALBUMIN 3.7 12/13/2017 1014   AST  41 (H) 12/13/2017 1014   ALT 60 (H) 12/13/2017 1014   ALKPHOS 139 (H) 12/13/2017 1014   BILITOT 0.8 12/13/2017 1014   BILIDIR 0.2 04/07/2015 1349   IBILI 1.0 04/07/2015 1349      Component Value Date/Time   TSH 5.570 (H) 12/13/2017 1014   TSH 8.710 (H) 09/05/2017 0929   TSH 7.870 (H) 05/23/2017 1249   Results for LYNSIE, MCWATTERS (MRN [id number]) as of 01/22/2018 14:07  Ref. Range 12/13/2017 10:14  Vitamin D, 25-Hydroxy Latest Ref Range: 30.0 - 100.0 ng/mL 29.3 (L)   ASSESSMENT AND PLAN: Vitamin D deficiency - Plan: Vitamin D, Ergocalciferol, (DRISDOL) 1.25 MG (50000 UT) CAPS capsule  Essential hypertension - Plan: chlorthalidone (HYGROTON) 25 MG tablet  Other depression - with emotional eating - Plan: buPROPion (WELLBUTRIN SR) 150 MG 12 hr tablet  At risk for osteoporosis  Class 3 severe obesity with serious comorbidity and body mass index (BMI) of 45.0 to 49.9 in adult, unspecified obesity type ([employer])  PLAN:  Vitamin D Deficiency Melanie [name] was informed that low vitamin D levels contributes to fatigue and are associated with obesity, breast, and colon cancer. She agrees to continue to take prescription Vit D @50 ,000 IU, 1 capsule 2 times a week #10 with no refills and will follow up for routine testing of vitamin D, at least 2-3 times per year. She was informed of the risk of over-replacement of vitamin D and agrees to not increase her dose unless she discusses this with [place] first. Melanie [name] agrees to follow up in 2 weeks.  At risk for osteopenia and osteoporosis Melanie [name] was given extended (15 minutes) osteoporosis prevention counseling today. Melanie [name] is at risk for osteopenia and osteoporosis due to her vitamin D deficiency. She was  encouraged to take her vitamin D and follow her higher calcium diet and increase strengthening exercise to help strengthen her bones and decrease her risk of osteopenia and osteoporosis.  Hypertension We discussed sodium restriction, working on healthy weight loss, and a  regular exercise program as the means to achieve improved blood pressure control. We will continue to monitor her blood pressure as well as her progress with the above lifestyle modifications. She will continue her chlorthalidone 65m #30 with no refills as prescribed and will watch for signs of hypotension as she continues her lifestyle modifications. Melanie [name] with this plan and agreed to follow up as directed.  Depression with Emotional Eating Behaviors We discussed behavior modification techniques today to help Melanie [name] with her emotional eating and depression. She has agreed to take buspirone 11m 1 PO BID #60 with no refills and agreed to follow up as directed in 2 weeks.  Obesity Melanie [name] currently in the action stage of change. As such, her goal is to continue with weight loss efforts. She has agreed to follow the Category 3 plan. LiKorrinas been instructed to work up to a goal of 150 minutes of combined cardio and strengthening exercise per week for weight loss and overall health benefits. We discussed the following Behavioral Modification Strategies today: work on meal planning and easy cooking plans and planning for success.  LiKemyaas agreed to follow up with our clinic in 2 weeks. She was informed of the importance of frequent follow up visits to maximize her success with intensive lifestyle modifications for her multiple health conditions.   OBESITY BEHAVIORAL INTERVENTION VISIT  Today's visit was # 13   Starting weight: 338 lbs Starting date: 05/23/17 Today's weight : Weight: (!) 323 lb (146.5 kg)  Today's date: 01/21/2018 Total lbs lost to date: 15  ASK: We discussed the diagnosis of obesity with LiCoralee Northoday and LiNovellegreed to give [place]ission to discuss obesity behavioral modification therapy today.  ASSESS: LiSkarlethas the diagnosis of obesity and her BMI today is 47.68. LiLetisias in the action stage of change.   ADVISE: LiWillmaas educated on the multiple health  risks of obesity as well as the benefit of weight loss to improve her health. She was advised of the need for long term treatment and the importance of lifestyle modifications to improve her current health and to decrease her risk of future health problems.  AGREE: Multiple dietary modification options and treatment options were discussed and LiAdalygreed to follow the recommendations documented in the above note.  ARRANGE: LiCalahas educated on the importance of frequent visits to treat obesity as outlined per CMS and USPSTF guidelines and agreed to schedule her next follow up appointment today.  I,Lenward Chancelloram acting as transcriptionist for TrAbby PotashPA-C I, TrAbby PotashPA-C have reviewed above note and agree with its content

## 2018-01-23 ENCOUNTER — Encounter: Payer: Self-pay | Admitting: Emergency Medicine

## 2018-01-23 ENCOUNTER — Ambulatory Visit (INDEPENDENT_AMBULATORY_CARE_PROVIDER_SITE_OTHER): Payer: BC Managed Care – PPO

## 2018-01-23 ENCOUNTER — Ambulatory Visit: Payer: BC Managed Care – PPO | Admitting: Emergency Medicine

## 2018-01-23 ENCOUNTER — Other Ambulatory Visit: Payer: Self-pay

## 2018-01-23 VITALS — BP 129/74 | HR 73 | Temp 98.8°F | Resp 16 | Ht 68.0 in | Wt 330.2 lb

## 2018-01-23 DIAGNOSIS — G8929 Other chronic pain: Secondary | ICD-10-CM

## 2018-01-23 DIAGNOSIS — M7731 Calcaneal spur, right foot: Secondary | ICD-10-CM

## 2018-01-23 DIAGNOSIS — M7732 Calcaneal spur, left foot: Secondary | ICD-10-CM | POA: Diagnosis not present

## 2018-01-23 DIAGNOSIS — M79671 Pain in right foot: Secondary | ICD-10-CM

## 2018-01-23 DIAGNOSIS — M79672 Pain in left foot: Secondary | ICD-10-CM | POA: Diagnosis not present

## 2018-01-23 MED ORDER — DICLOFENAC SODIUM 75 MG PO TBEC: 75.0000 mg | DELAYED_RELEASE_TABLET | Freq: Two times a day (BID) | ORAL | 0 refills | Status: AC

## 2018-01-23 NOTE — Patient Instructions (Addendum)
If you have lab work done today you will be contacted with your lab results within the next 2 weeks.  If you have not heard from [place] then please contact us. The fastest way to get your results is to register for My Chart.   IF you received an x-ray today, you will receive an invoice from [facility]. Please contact [facility] at [phone] with questions or concerns regarding your invoice.   IF you received labwork today, you will receive an invoice from [facility]. Please contact [facility] at [phone] with questions or concerns regarding your invoice.   Our billing staff will not be able to assist you with questions regarding bills from these companies.  You will be contacted with the lab results as soon as they are available. The fastest way to get your results is to activate your My Chart account. Instructions are located on the last page of this paperwork. If you have not heard from [place] regarding the results in 2 weeks, please contact this office.     Heel Spur A heel spur is a bony growth that forms on the bottom of your heel bone (calcaneus). Heel spurs are common and do not always cause pain. However, heel spurs often cause inflammation in the strong band of tissue that runs underneath the bone of your foot (plantar fascia). When this happens, you may feel pain on the bottom of your foot, near your heel. What are the causes? The cause of heel spurs is not completely understood. They may be caused by pressure on the heel. Or, they may stem from the muscle attachments (tendons) near the spur pulling on the heel. What increases the risk? You may be at risk for a heel spur if you:  Are older than 40.  Are overweight.  Have wear and tear arthritis (osteoarthritis).  Have plantar fascia inflammation.  What are the signs or symptoms? Some people have heel spurs but no symptoms. If you do have symptoms, they may include:  Pain in the bottom of your  heel.  Pain that is worse when you first get out of bed.  Pain that gets worse after walking or standing.  How is this diagnosed? Your health care provider may diagnose a heel spur based on your symptoms and a physical exam. You may also have an X-ray of your foot to check for a bony growth coming from the calcaneus. How is this treated? Treatment aims to relieve the pain from the heel spur. This may include:  Stretching exercises.  Losing weight.  Wearing specific shoes, inserts, or orthotics for comfort and support.  Wearing splints at night to properly position your feet.  Taking over-the-counter medicine to relieve pain.  Being treated with high-intensity sound waves to break up the heel spur (extracorporeal shock wave therapy).  Getting steroid injections in your heel to reduce swelling and ease pain.  Having surgery if your heel spur causes long-term (chronic) pain.  Follow these instructions at home:  Take medicines only as directed by your health care provider.  Ask your health care provider if you should use ice or cold packs on the painful areas of your heel or foot.  Avoid activities that cause you pain until you recover or as directed by your health care provider.  Stretch before exercising or being physically active.  Wear supportive shoes that fit well as directed by your health care provider. You might need to buy new shoes. Wearing old shoes or shoes  that do not fit correctly may not provide the support that you need.  Lose weight if your health care provider thinks you should. This can relieve pressure on your foot that may be causing pain and discomfort. Contact a health care provider if:  Your pain continues or gets worse. This information is not intended to replace advice given to you by your health care provider. Make sure you discuss any questions you have with your health care provider. Document Released: 04/05/2005 Document Revised: 08/05/2015  Document Reviewed: 04/30/2013 Elsevier Interactive Patient Education  [employer].

## 2018-01-23 NOTE — Progress Notes (Signed)
Melanie [name] 42 y.o.   Chief Complaint  Patient presents with  . Foot Pain    2 weeks -BOTH per patient it is getting worst    HISTORY OF PRESENT ILLNESS: This is a 42 y.o. female complaining of bilateral feet pain for the past 2 weeks.  Denies injury.  Concerned about a stress fracture.  No other associated symptoms.  No history of diabetes.  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  HPI   Prior to Admission medications   Medication Sig Start Date End Date Taking? Authorizing Provider  buPROPion (WELLBUTRIN SR) 150 MG 12 hr tablet Take 1 tablet (150 mg total) by mouth 2 (two) times daily. 01/22/18  Yes Abby Potash, PA-C  busPIRone (BUSPAR) 15 MG tablet TAKE ONE TABLET BY MOUTH TWICE A DAY OR AS DIRECTED 11/14/17  Yes Stallings, Zoe A, MD  cetirizine (ZYRTEC) 10 MG tablet Take 10 mg by mouth daily.   Yes [provider]  chlorthalidone (HYGROTON) 25 MG tablet Take 1 tablet (25 mg total) by mouth daily. 01/22/18  Yes Abby Potash, PA-C  citalopram (CELEXA) 20 MG tablet TAKE ONE TABLET BY MOUTH DAILY 03/30/17  Yes Jeffery, Chelle, PA  fluticasone (FLONASE) 50 MCG/ACT nasal spray Place 2 sprays into both nostrils daily. 03/30/17  Yes Jeffery, Domingo Mend, PA  levothyroxine (SYNTHROID, LEVOTHROID) 75 MCG tablet Take 1 tablet (75 mcg total) by mouth daily. 12/31/17  Yes Abby Potash, PA-C  LORazepam (ATIVAN) 1 MG tablet Take 1 tablet (1 mg total) by mouth 2 (two) times daily as needed for anxiety. 03/30/17  Yes Jeffery, Domingo Mend, PA  metFORMIN (GLUCOPHAGE) 500 MG tablet TAKE ONE TABLET BY MOUTH DAILY WITH BREAKFAST 01/15/18  Yes Abby Potash, PA-C  montelukast (SINGULAIR) 10 MG tablet Take 1 tablet (10 mg total) by mouth at bedtime. 03/30/17  Yes Harrison Mons, PA  Multiple Vitamin (MULTIVITAMIN) tablet Take 1 tablet by mouth daily.   Yes [provider]  Omega-3 Fatty Acids (FISH OIL PO) Take by mouth daily.   Yes [provider]    Vitamin D, Ergocalciferol, (DRISDOL) 1.25 MG (50000 UT) CAPS capsule TAKE ONE CAPSULE BY MOUTH TWICE WEEKLY 01/22/18  Yes Abby Potash, PA-C    Allergies  Allergen Reactions  . Lidocaine Other (See Comments)    Flu like sx's  . Relafen [Nabumetone] Other (See Comments)    migraine  . Celebrex [Celecoxib] Rash  . Sulfa Antibiotics Rash    Patient Active Problem List   Diagnosis Date Noted  . Other fatigue 05/23/2017  . Shortness of breath on exertion 05/23/2017  . Prediabetes 05/23/2017  . Liver fibrosis 05/23/2017  . Hyperlipidemia 04/16/2017  . OSA (obstructive sleep apnea) 11/23/2015  . NASH (nonalcoholic steatohepatitis) 08/27/2015  . Dependent edema 08/27/2015  . BMI 40.0-44.9, adult ([place]) 03/15/2015  . Hepatic fibrosis 06/08/2014  . Depression with anxiety 06/06/2013  . Perennial allergic rhinitis 06/06/2013  . Infertility associated with anovulation 06/06/2013    Past Medical History:  Diagnosis Date  . Allergy   . Anemia   . Anxiety   . Back pain   . Constipation   . Depression   . Fatty liver   . GERD (gastroesophageal reflux disease)   . Hyperlipidemia   . Infertility, female   . Joint pain   . Kidney problem   . Membranous nephrosis   . Multiple food allergies   . Muscle pain   . Numbness and tingling in both hands   .  Pre-diabetes   . Shortness of breath   . Sleep apnea   . Stress   . Swelling of both lower extremities     Past Surgical History:  Procedure Laterality Date  . FINGER SURGERY Left     index finger  . KNEE SURGERY Left   . LIVER BIOPSY N/A 05/15/2014   Procedure: LIVER BIOPSY;  Surgeon: Inda Castle, MD;  Location: [facility];  Service: Endoscopy;  Laterality: N/A;  ultrasound to mark the liver  . TONSILLECTOMY AND ADENOIDECTOMY  1978  . TYMPANOSTOMY TUBE PLACEMENT    . wrist cyst Left   . WRIST SURGERY     left - cyst removed    Social History   Socioeconomic History  . Marital status: Married    Spouse name:  [name]  . Number of children: 5  . Years of education: in [employer] program  . Highest education level: Bachelor's degree (e.g., BA, AB, BS)  Occupational History  . Occupation: Product manager: [employer]    Comment: ESL  Social Needs  . Financial resource strain: Not on file  . Food insecurity:    Worry: Not on file    Inability: Not on file  . Transportation needs:    Medical: Not on file    Non-medical: Not on file  Tobacco Use  . Smoking status: Never Smoker  . Smokeless tobacco: Never Used  Substance and Sexual Activity  . Alcohol use: No    Alcohol/week: 0.0 standard drinks  . Drug use: No  . Sexual activity: Yes    Partners: Male    Birth control/protection: Post-menopausal  Lifestyle  . Physical activity:    Days per week: Not on file    Minutes per session: Not on file  . Stress: Not on file  Relationships  . Social connections:    Talks on phone: Not on file    Gets together: Not on file    Attends religious service: Not on file    Active member of club or organization: Not on file    Attends meetings of clubs or organizations: Not on file    Relationship status: Not on file  . Intimate partner violence:    Fear of current or ex partner: Not on file    Emotionally abused: Not on file    Physically abused: Not on file    Forced sexual activity: Not on file  Other Topics Concern  . Not on file  Social History Narrative   Lives with her husband and their 5 adpoted children.    Adopted 2 sets of siblings, all with psychiatric diagnoses (ADHD, Asperger's, ODD, PTSD, depression, bipolar disorder).   Her husband has Asperger's, and works in disability services.   Occasionally drinks coke or pepsi     Family History  Problem Relation Age of Onset  . Diabetes Mother   . Liver disease Mother        ESLD- unknown etiology  . Obesity Mother   . Diabetes Maternal Grandmother   . Diabetes Paternal Grandmother   . Breast cancer Paternal Grandmother    . Stroke Maternal Grandfather   . Hyperlipidemia Father      Review of Systems  Constitutional: Negative.  Negative for chills and fever.  Respiratory: Negative.  Negative for cough.   Cardiovascular: Negative.  Negative for chest pain and palpitations.  Gastrointestinal: Negative for abdominal pain, nausea and vomiting.  Musculoskeletal:       Foot  pain  Skin: Negative.  Negative for rash.  All other systems reviewed and are negative.  Vitals:   01/23/18 1635  BP: 129/74  Pulse: 73  Resp: 16  Temp: 98.8 F (37.1 C)  SpO2: 95%     Physical Exam  Constitutional: She is oriented to person, place, and time. She appears well-developed.  Obese  HENT:  Head: Normocephalic and atraumatic.  Eyes: Pupils are equal, round, and reactive to light.  Neck: Normal range of motion. Neck supple.  Cardiovascular: Normal rate and regular rhythm.  Pulmonary/Chest: Effort normal.  Musculoskeletal:  Feet: Right foot: Positive tenderness to calcaneus area.  No erythema or bruising. Left foot: Positive swelling to the dorsal area with mild tenderness to palpation.  No erythema or bruising. NVI with full range of motion. Ankles: Full range of motion.  Within normal limits.  Neurological: She is alert and oriented to person, place, and time.  Skin: Skin is warm. Capillary refill takes less than 2 seconds.  Psychiatric: She has a normal mood and affect. Her behavior is normal.  Vitals reviewed.  Dg Foot 2 Views Left  Result Date: 01/23/2018 CLINICAL DATA:  Foot pain, no known injury EXAM: LEFT FOOT - 2 VIEW COMPARISON:  None. FINDINGS: There is no evidence of fracture or dislocation. There is no erosive change. There is no periosteal reaction or bone destruction. 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 There is soft tissue swelling over the midfoot. IMPRESSION: No acute osseous injury of the left foot. Electronically Signed   By: Kathreen Devoid   On: 01/23/2018 17:27   Dg Foot 2  Views Right  Result Date: 01/23/2018 CLINICAL DATA:  Foot pain, no known injury EXAM: RIGHT FOOT - 2 VIEW COMPARISON:  None. FINDINGS: There is no evidence of fracture or dislocation. There is no erosive change. There is no periosteal reaction or bone destruction. 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 There is mild osteoarthritis of the first MTP joint. There is soft tissue swelling over the midfoot. IMPRESSION: No acute osseous injury of the left foot. Electronically Signed   By: Kathreen Devoid   On: 01/23/2018 17:27    A total of 25 minutes was spent in the room with the patient, greater than 50% of which was in counseling/coordination of care regarding diagnosis, treatment, x-ray review, medications, and need for follow-up with podiatrist.  ASSESSMENT & PLAN: Antwan was seen today for foot pain.  Diagnoses and all orders for this visit:  Chronic pain of both feet -     DG Foot 2 Views Left; Future -     DG Foot 2 Views Right; Future -     Ambulatory referral to Podiatry  Bilateral calcaneal spurs -     Ambulatory referral to Podiatry  Other orders -     diclofenac (VOLTAREN) 75 MG EC tablet; Take 1 tablet (75 mg total) by mouth 2 (two) times daily for 5 days. After 5 days take as needed.    Patient Instructions       If you have lab work done today you will be contacted with your lab results within the next 2 weeks.  If you have not heard from [place] then please contact us. The fastest way to get your results is to register for My Chart.   IF you received an x-ray today, you will receive an invoice from [facility]. Please contact [facility] at [phone] with questions or concerns regarding your invoice.  IF you received labwork today, you will receive an invoice from [facility]. Please contact [facility] at [phone] with questions or concerns regarding your invoice.   Our billing staff will not be able to assist you with questions regarding  bills from these companies.  You will be contacted with the lab results as soon as they are available. The fastest way to get your results is to activate your My Chart account. Instructions are located on the last page of this paperwork. If you have not heard from [place] regarding the results in 2 weeks, please contact this office.     Heel Spur A heel spur is a bony growth that forms on the bottom of your heel bone (calcaneus). Heel spurs are common and do not always cause pain. However, heel spurs often cause inflammation in the strong band of tissue that runs underneath the bone of your foot (plantar fascia). When this happens, you may feel pain on the bottom of your foot, near your heel. What are the causes? The cause of heel spurs is not completely understood. They may be caused by pressure on the heel. Or, they may stem from the muscle attachments (tendons) near the spur pulling on the heel. What increases the risk? You may be at risk for a heel spur if you:  Are older than 40.  Are overweight.  Have wear and tear arthritis (osteoarthritis).  Have plantar fascia inflammation.  What are the signs or symptoms? Some people have heel spurs but no symptoms. If you do have symptoms, they may include:  Pain in the bottom of your heel.  Pain that is worse when you first get out of bed.  Pain that gets worse after walking or standing.  How is this diagnosed? Your health care provider may diagnose a heel spur based on your symptoms and a physical exam. You may also have an X-ray of your foot to check for a bony growth coming from the calcaneus. How is this treated? Treatment aims to relieve the pain from the heel spur. This may include:  Stretching exercises.  Losing weight.  Wearing specific shoes, inserts, or orthotics for comfort and support.  Wearing splints at night to properly position your feet.  Taking over-the-counter medicine to relieve pain.  Being treated with  high-intensity sound waves to break up the heel spur (extracorporeal shock wave therapy).  Getting steroid injections in your heel to reduce swelling and ease pain.  Having surgery if your heel spur causes long-term (chronic) pain.  Follow these instructions at home:  Take medicines only as directed by your health care provider.  Ask your health care provider if you should use ice or cold packs on the painful areas of your heel or foot.  Avoid activities that cause you pain until you recover or as directed by your health care provider.  Stretch before exercising or being physically active.  Wear supportive shoes that fit well as directed by your health care provider. You might need to buy new shoes. Wearing old shoes or shoes that do not fit correctly may not provide the support that you need.  Lose weight if your health care provider thinks you should. This can relieve pressure on your foot that may be causing pain and discomfort. Contact a health care provider if:  Your pain continues or gets worse. This information is not intended to replace advice given to you by your health care provider. Make sure you discuss any questions you have with your health  care provider. Document Released: 04/05/2005 Document Revised: 08/05/2015 Document Reviewed: 04/30/2013 Elsevier Interactive Patient Education  2018 Elsevier Inc.      Agustina Caroli, MD [facility]

## 2018-01-26 ENCOUNTER — Other Ambulatory Visit (INDEPENDENT_AMBULATORY_CARE_PROVIDER_SITE_OTHER): Payer: Self-pay | Admitting: Physician Assistant

## 2018-01-26 DIAGNOSIS — E038 Other specified hypothyroidism: Secondary | ICD-10-CM

## 2018-01-31 ENCOUNTER — Other Ambulatory Visit: Payer: Self-pay | Admitting: Podiatry

## 2018-01-31 ENCOUNTER — Other Ambulatory Visit (INDEPENDENT_AMBULATORY_CARE_PROVIDER_SITE_OTHER): Payer: Self-pay | Admitting: Physician Assistant

## 2018-01-31 ENCOUNTER — Encounter: Payer: Self-pay | Admitting: Podiatry

## 2018-01-31 ENCOUNTER — Ambulatory Visit (INDEPENDENT_AMBULATORY_CARE_PROVIDER_SITE_OTHER): Payer: BC Managed Care – PPO

## 2018-01-31 ENCOUNTER — Ambulatory Visit: Payer: BC Managed Care – PPO | Admitting: Podiatry

## 2018-01-31 VITALS — BP 111/62 | HR 75 | Resp 16

## 2018-01-31 DIAGNOSIS — M7662 Achilles tendinitis, left leg: Secondary | ICD-10-CM | POA: Diagnosis not present

## 2018-01-31 DIAGNOSIS — M779 Enthesopathy, unspecified: Secondary | ICD-10-CM

## 2018-01-31 DIAGNOSIS — M7661 Achilles tendinitis, right leg: Secondary | ICD-10-CM | POA: Diagnosis not present

## 2018-01-31 DIAGNOSIS — E559 Vitamin D deficiency, unspecified: Secondary | ICD-10-CM

## 2018-01-31 DIAGNOSIS — M722 Plantar fascial fibromatosis: Secondary | ICD-10-CM

## 2018-01-31 DIAGNOSIS — M778 Other enthesopathies, not elsewhere classified: Secondary | ICD-10-CM

## 2018-01-31 MED ORDER — TRIAMCINOLONE ACETONIDE 10 MG/ML IJ SUSP
10.0000 mg | Freq: Once | INTRAMUSCULAR | Status: AC
  Administered 2018-01-31: 10 mg

## 2018-01-31 NOTE — Progress Notes (Signed)
Subjective:    Patient ID: Melanie [name], female    DOB: [birth year], 42 y.o.   MRN: [id number]  HPI    Review of Systems  All other systems reviewed and are negative.      Objective:   Physical Exam        Assessment & Plan:

## 2018-01-31 NOTE — Patient Instructions (Signed)

## 2018-02-03 NOTE — Progress Notes (Signed)
Subjective:   Patient ID: Melanie [name], female   DOB: 42 y.o.   MRN: [id number]   HPI Patient presents with a lot of discomfort in the left Achilles tendon insertion lateral side with pain on top of the right foot which is become quite inflamed.  States is been present for several months and she should tried shoe gear modifications and reduced activity.  Patient does not smoke likes to be active   Review of Systems  All other systems reviewed and are negative.       Objective:  Physical Exam  Constitutional: She appears well-developed and well-nourished.  Cardiovascular: Intact distal pulses.  Pulmonary/Chest: Effort normal.  Musculoskeletal: Normal range of motion.  Neurological: She is alert.  Skin: Skin is warm.  Nursing note and vitals reviewed.   Neurovascular status intact muscle strength is adequate range of motion within normal limits with exquisite discomfort posterior lateral aspect left heel at the insertional point tendon calcaneus with good muscle strength noted and is noted to have pain on the dorsum of the right foot within the extensor tendon complex.  Patient has good digital perfusion well oriented x3     Assessment:  Acute Achilles tendinitis left lateral side with dorsal extensor tendinitis right     Plan:  H&P both conditions reviewed x-rays reviewed and today I did careful lateral injection 3 mg Kenalog 5 mg Xylocaine after explaining chances for rupture.  I then injected the dorsal extensor tendon complex 3 mg Kenalog 5 Milgram Xylocaine and advised on heat ice right and dispensed air fracture walker left to completely immobilize and allow it to rest.  Reappoint to recheck again in the next several weeks  X-rays taken today did indicate posterior spur formation with no indication of extensor inflammation right with no indications of advanced arthritis.  Also placed on oral diclofenac 75 mg twice daily

## 2018-02-04 ENCOUNTER — Telehealth: Payer: Self-pay | Admitting: Podiatry

## 2018-02-04 ENCOUNTER — Ambulatory Visit (INDEPENDENT_AMBULATORY_CARE_PROVIDER_SITE_OTHER): Payer: BC Managed Care – PPO | Admitting: Physician Assistant

## 2018-02-04 ENCOUNTER — Encounter (INDEPENDENT_AMBULATORY_CARE_PROVIDER_SITE_OTHER): Payer: Self-pay | Admitting: Physician Assistant

## 2018-02-04 VITALS — BP 120/74 | HR 68 | Temp 98.2°F | Ht 68.0 in | Wt 319.0 lb

## 2018-02-04 DIAGNOSIS — Z6841 Body Mass Index (BMI) 40.0 and over, adult: Secondary | ICD-10-CM

## 2018-02-04 DIAGNOSIS — E559 Vitamin D deficiency, unspecified: Secondary | ICD-10-CM | POA: Diagnosis not present

## 2018-02-04 DIAGNOSIS — Z9189 Other specified personal risk factors, not elsewhere classified: Secondary | ICD-10-CM

## 2018-02-04 DIAGNOSIS — F3289 Other specified depressive episodes: Secondary | ICD-10-CM

## 2018-02-04 MED ORDER — DICLOFENAC SODIUM 75 MG PO TBEC: 75.0000 mg | DELAYED_RELEASE_TABLET | Freq: Two times a day (BID) | ORAL | 0 refills | Status: DC

## 2018-02-04 MED ORDER — BUPROPION HCL ER (SR) 150 MG PO TB12: 150.0000 mg | ORAL_TABLET | Freq: Two times a day (BID) | ORAL | 0 refills | Status: DC

## 2018-02-04 NOTE — Telephone Encounter (Signed)
Pt was suppose to receive an anti-inflammatory to her pharmacy on Thursday 01/31/2018. Pharmacy has not received it.

## 2018-02-04 NOTE — Telephone Encounter (Signed)
Left message informing pt the antiinflammatory had been sent to the [facility] and apologized for the delay.

## 2018-02-04 NOTE — Addendum Note (Signed)
Addended by: Harriett Sine D on: 02/04/2018 01:42 PM   Modules accepted: Orders

## 2018-02-06 NOTE — Progress Notes (Signed)
Office: [phone]  /  Fax: [phone]   HPI:   Chief Complaint: OBESITY Melanie [name] is here to discuss her progress with her obesity treatment plan. She is on the Category 3 plan and is following her eating plan approximately 60 % of the time. She states she is exercising 0 minutes 0 times per week. Dawnn did very well with weight loss. She reports having an adverse reaction to a cortisone injection in her foot recently, which caused a decrease in her appetite. Her weight is (!) 319 lb (144.7 kg) today and has had a weight loss of 4 pounds over a period of 2 weeks since her last visit. She has lost 19 lbs since starting treatment with [place].  Vitamin D deficiency Farrell has a diagnosis of vitamin D deficiency. She is currently taking vit D and denies nausea, vomiting or muscle weakness.  At risk for osteopenia and osteoporosis Kaeden is at higher risk of osteopenia and osteoporosis due to vitamin D deficiency.   Depression with emotional eating behaviors Robena is struggling with emotional eating and using food for comfort to the extent that it is negatively impacting her health. She often snacks when she is not hungry. Leyanna sometimes feels she is out of control and then feels guilty that she made poor food choices. She has no cravings and her blood pressure is normal. She has been working on behavior modification techniques to help reduce her emotional eating and has been somewhat successful. She shows no sign of suicidal or homicidal ideations.  Depression screen [facility] 2/9 01/23/2018 05/23/2017 04/12/2017 04/05/2017 03/30/2017  Decreased Interest 0 1 0 0 0  Down, Depressed, Hopeless 0 1 0 0 0  PHQ - 2 Score 0 2 0 0 0  Altered sleeping - 2 - - -  Tired, decreased energy - 2 - - -  Change in appetite - 2 - - -  Feeling bad or failure about yourself  - 1 - - -  Trouble concentrating - 0 - - -  Moving slowly or fidgety/restless - 0 - - -  Suicidal thoughts - 0 - - -  PHQ-9 Score - 9 - - -  Difficult  doing work/chores - Not difficult at all - - -     ALLERGIES: Allergies  Allergen Reactions  . Lidocaine Other (See Comments)    Flu like sx's  . Relafen [Nabumetone] Other (See Comments)    migraine  . Celebrex [Celecoxib] Rash  . Sulfa Antibiotics Rash    MEDICATIONS: Current Outpatient Medications on File Prior to Visit  Medication Sig Dispense Refill  . atorvastatin (LIPITOR) 20 MG tablet     . busPIRone (BUSPAR) 15 MG tablet TAKE ONE TABLET BY MOUTH TWICE A DAY OR AS DIRECTED. NEEDS OFFICE VISIT. 60 tablet 0  . cetirizine (ZYRTEC) 10 MG tablet Take 10 mg by mouth daily.    . chlorthalidone (HYGROTON) 25 MG tablet Take 1 tablet (25 mg total) by mouth daily. 30 tablet 0  . citalopram (CELEXA) 20 MG tablet TAKE ONE TABLET BY MOUTH DAILY 90 tablet 3  . diclofenac (VOLTAREN) 50 MG EC tablet Take 50 mg by mouth 2 (two) times daily.    . fluticasone (FLONASE) 50 MCG/ACT nasal spray Place 2 sprays into both nostrils daily. 48 g 3  . levothyroxine (SYNTHROID, LEVOTHROID) 75 MCG tablet TAKE ONE TABLET BY MOUTH DAILY 30 tablet 0  . LORazepam (ATIVAN) 1 MG tablet Take 1 tablet (1 mg total) by mouth 2 (two) times  daily as needed for anxiety. 20 tablet 0  . metFORMIN (GLUCOPHAGE) 500 MG tablet TAKE ONE TABLET BY MOUTH DAILY WITH BREAKFAST 30 tablet 0  . montelukast (SINGULAIR) 10 MG tablet Take 1 tablet (10 mg total) by mouth at bedtime. 90 tablet 3  . Multiple Vitamin (MULTIVITAMIN) tablet Take 1 tablet by mouth daily.    . Omega-3 Fatty Acids (FISH OIL PO) Take by mouth daily.    . Vitamin D, Ergocalciferol, (DRISDOL) 1.25 MG (50000 UT) CAPS capsule TAKE ONE CAPSULE BY MOUTH TWICE WEEKLY 10 capsule 0   Current Facility-Administered Medications on File Prior to Visit  Medication Dose Route Frequency Provider Last Rate Last Dose  . cefTRIAXone (ROCEPHIN) injection 1 g  1 g Intramuscular Once Rutherford Guys, MD        PAST MEDICAL HISTORY: Past Medical History:  Diagnosis Date  .  Allergy   . Anemia   . Anxiety   . Back pain   . Constipation   . Depression   . Fatty liver   . GERD (gastroesophageal reflux disease)   . Hyperlipidemia   . Infertility, female   . Joint pain   . Kidney problem   . Membranous nephrosis   . Multiple food allergies   . Muscle pain   . Numbness and tingling in both hands   . Pre-diabetes   . Shortness of breath   . Sleep apnea   . Stress   . Swelling of both lower extremities     PAST SURGICAL HISTORY: Past Surgical History:  Procedure Laterality Date  . FINGER SURGERY Left     index finger  . KNEE SURGERY Left   . LIVER BIOPSY N/A 05/15/2014   Procedure: LIVER BIOPSY;  Surgeon: Inda Castle, MD;  Location: [facility];  Service: Endoscopy;  Laterality: N/A;  ultrasound to mark the liver  . TONSILLECTOMY AND ADENOIDECTOMY  1978  . TYMPANOSTOMY TUBE PLACEMENT    . wrist cyst Left   . WRIST SURGERY     left - cyst removed    SOCIAL HISTORY: Social History   Tobacco Use  . Smoking status: Never Smoker  . Smokeless tobacco: Never Used  Substance Use Topics  . Alcohol use: No    Alcohol/week: 0.0 standard drinks  . Drug use: No    FAMILY HISTORY: Family History  Problem Relation Age of Onset  . Diabetes Mother   . Liver disease Mother        ESLD- unknown etiology  . Obesity Mother   . Diabetes Maternal Grandmother   . Diabetes Paternal Grandmother   . Breast cancer Paternal Grandmother   . Stroke Maternal Grandfather   . Hyperlipidemia Father     ROS: Review of Systems  Constitutional: Positive for weight loss.  Gastrointestinal: Negative for nausea and vomiting.  Musculoskeletal:       Negative for muscle weakness  Psychiatric/Behavioral: Positive for depression. Negative for suicidal ideas.    PHYSICAL EXAM: Blood pressure 120/74, pulse 68, temperature 98.2 F (36.8 C), temperature source Oral, height 5' 8"  (1.727 m), weight (!) 319 lb (144.7 kg), SpO2 97 %. Body mass index is 48.5  kg/m. Physical Exam  Constitutional: She is oriented to person, place, and time. She appears well-developed and well-nourished.  Cardiovascular: Normal rate.  Pulmonary/Chest: Effort normal.  Musculoskeletal: Normal range of motion.  Neurological: She is oriented to person, place, and time.  Skin: Skin is warm and dry.  Psychiatric: She has a normal mood and  affect. Her behavior is normal. She expresses no homicidal and no suicidal ideation.  Vitals reviewed.   RECENT LABS AND TESTS: BMET    Component Value Date/Time   NA 140 12/13/2017 1014   K 4.0 12/13/2017 1014   CL 99 12/13/2017 1014   CO2 28 12/13/2017 1014   GLUCOSE 124 (H) 12/13/2017 1014   GLUCOSE 118 (H) 09/08/2015 1514   BUN 17 12/13/2017 1014   CREATININE 0.89 12/13/2017 1014   CREATININE 0.85 09/08/2015 1514   CALCIUM 9.9 12/13/2017 1014   GFRNONAA 80 12/13/2017 1014   GFRNONAA 85 09/08/2015 1514   GFRAA 92 12/13/2017 1014   GFRAA >89 09/08/2015 1514   Lab Results  Component Value Date   HGBA1C 5.6 12/13/2017   HGBA1C 5.0 09/05/2017   HGBA1C 5.8 (H) 05/23/2017   HGBA1C 5.7 04/12/2017   HGBA1C 5.8 (H) 05/19/2015   Lab Results  Component Value Date   INSULIN 39.0 (H) 12/13/2017   INSULIN 24.3 09/05/2017   INSULIN 30.4 (H) 05/23/2017   CBC    Component Value Date/Time   WBC 7.2 05/23/2017 1249   WBC 8.3 08/27/2015 1633   RBC 4.66 05/23/2017 1249   RBC 4.27 08/27/2015 1633   HGB 14.7 05/23/2017 1249   HCT 43.3 05/23/2017 1249   PLT 213 04/05/2017 0947   MCV 93 05/23/2017 1249   MCH 31.5 05/23/2017 1249   MCH 31.1 08/27/2015 1633   MCHC 33.9 05/23/2017 1249   MCHC 34.3 08/27/2015 1633   RDW 13.6 05/23/2017 1249   LYMPHSABS 2.8 05/23/2017 1249   MONOABS 0.4 10/27/2013 1604   EOSABS 0.3 05/23/2017 1249   BASOSABS 0.0 05/23/2017 1249   Iron/TIBC/Ferritin/ %Sat    Component Value Date/Time   IRON 95 06/27/2013 1645   TIBC 371 06/27/2013 1645   FERRITIN 401 (H) 06/27/2013 1645   IRONPCTSAT  26 06/27/2013 1645   Lipid Panel     Component Value Date/Time   CHOL 383 (H) 12/13/2017 1014   TRIG 229 (H) 12/13/2017 1014   HDL 58 12/13/2017 1014   CHOLHDL 6.6 (H) 12/13/2017 1014   CHOLHDL 5.0 06/06/2013 1654   VLDL 27 06/06/2013 1654   LDLCALC 279 (H) 12/13/2017 1014   Hepatic Function Panel     Component Value Date/Time   PROT 5.9 (L) 12/13/2017 1014   ALBUMIN 3.7 12/13/2017 1014   AST 41 (H) 12/13/2017 1014   ALT 60 (H) 12/13/2017 1014   ALKPHOS 139 (H) 12/13/2017 1014   BILITOT 0.8 12/13/2017 1014   BILIDIR 0.2 04/07/2015 1349   IBILI 1.0 04/07/2015 1349      Component Value Date/Time   TSH 5.570 (H) 12/13/2017 1014   TSH 8.710 (H) 09/05/2017 0929   TSH 7.870 (H) 05/23/2017 1249   Results for JUAN, OLTHOFF (MRN [id number]) as of 02/06/2018 16:04  Ref. Range 12/13/2017 10:14  Vitamin D, 25-Hydroxy Latest Ref Range: 30.0 - 100.0 ng/mL 29.3 (L)   ASSESSMENT AND PLAN: Vitamin D deficiency  Other depression - with emotional eating - Plan: buPROPion (WELLBUTRIN SR) 150 MG 12 hr tablet  At risk for osteoporosis  Class 3 severe obesity with serious comorbidity and body mass index (BMI) of 45.0 to 49.9 in adult, unspecified obesity type ([place])  PLAN:  Vitamin D Deficiency Latrica was informed that low vitamin D levels contributes to fatigue and are associated with obesity, breast, and colon cancer. She agrees to continue to take prescription Vit D @50 ,168 IU twice weekly and will follow up for  routine testing of vitamin D, at least 2-3 times per year. She was informed of the risk of over-replacement of vitamin D and agrees to not increase her dose unless she discusses this with [place] first.  At risk for osteopenia and osteoporosis Quanda was given extended  (15 minutes) osteoporosis prevention counseling today. Anai is at risk for osteopenia and osteoporosis due to her vitamin D deficiency. She was encouraged to take her vitamin D and follow her higher calcium diet and  increase strengthening exercise to help strengthen her bones and decrease her risk of osteopenia and osteoporosis.  Depression with Emotional Eating Behaviors We discussed behavior modification techniques today to help Demetris deal with her emotional eating and depression. She has agreed to continue Bupropion (Wellbutrin SR)150 mg BID #60 with no refills and follow up as directed.  Obesity Justin is currently in the action stage of change. As such, her goal is to continue with weight loss efforts She has agreed to follow the Category 3 plan Sharita has been instructed to work up to a goal of 150 minutes of combined cardio and strengthening exercise per week for weight loss and overall health benefits. We discussed the following Behavioral Modification Strategies today: work on meal planning and easy cooking plans and holiday eating strategies   Misaki has agreed to follow up with our clinic in 2 weeks. She was informed of the importance of frequent follow up visits to maximize her success with intensive lifestyle modifications for her multiple health conditions.   OBESITY BEHAVIORAL INTERVENTION VISIT  Today's visit was # 14   Starting weight: 338 lbs Starting date: 05/23/2017 Today's weight : 319 lbs Today's date: 02/04/2018 Total lbs lost to date: 26   ASK: We discussed the diagnosis of obesity with [name] today and Vianne agreed to give [place] permission to discuss obesity behavioral modification therapy today.  ASSESS: Miriana has the diagnosis of obesity and her BMI today is 48.51 Fadia is in the action stage of change   ADVISE: Druanne was educated on the multiple health risks of obesity as well as the benefit of weight loss to improve her health. She was advised of the need for long term treatment and the importance of lifestyle modifications to improve her current health and to decrease her risk of future health problems.  AGREE: Multiple dietary modification options and treatment  options were discussed and  Peachie agreed to follow the recommendations documented in the above note.  ARRANGE: Jasamine was educated on the importance of frequent visits to treat obesity as outlined per CMS and USPSTF guidelines and agreed to schedule her next follow up appointment today.  Corey Skains, am acting as transcriptionist for Abby Potash, PA-C I, Abby Potash, PA-C have reviewed above note and agree with its content

## 2018-02-18 ENCOUNTER — Encounter (INDEPENDENT_AMBULATORY_CARE_PROVIDER_SITE_OTHER): Payer: Self-pay

## 2018-02-18 ENCOUNTER — Other Ambulatory Visit: Payer: Self-pay

## 2018-02-18 ENCOUNTER — Ambulatory Visit: Payer: BC Managed Care – PPO | Admitting: Family Medicine

## 2018-02-18 ENCOUNTER — Ambulatory Visit (INDEPENDENT_AMBULATORY_CARE_PROVIDER_SITE_OTHER): Payer: BC Managed Care – PPO | Admitting: Physician Assistant

## 2018-02-18 ENCOUNTER — Encounter: Payer: Self-pay | Admitting: Family Medicine

## 2018-02-18 VITALS — BP 113/74 | HR 72 | Temp 98.1°F | Resp 18 | Ht 68.9 in | Wt 331.6 lb

## 2018-02-18 DIAGNOSIS — M7711 Lateral epicondylitis, right elbow: Secondary | ICD-10-CM

## 2018-02-18 MED ORDER — PREDNISONE 10 MG PO TABS: ORAL_TABLET | ORAL | 0 refills | Status: AC

## 2018-02-18 NOTE — Progress Notes (Signed)
Established Patient Office Visit  Subjective:  Patient ID: Melanie [name], female    DOB: [birth year]  Age: 42 y.o. MRN: [id number]  CC:  Chief Complaint  Patient presents with  . Elbow Pain    X 2 days right elbow    HPI Melanie [name] presents for elbow pain for 48 hours  Left handed female   She was putting up the christmas tree and reaching into boxes She denies any acute trauma She was hanging ornaments No overhead reaching much She hung some lights  She reports that it is swollen and painful and has a dull ache that goes through the elbow and down her arm If she moves it or uses it to turn her hand then the pain is severe She reports that she can barely grip anything  Her hand is also swollen  Pronation is the worse for her and the pain is 8/10.   Past Medical History:  Diagnosis Date  . Allergy   . Anemia   . Anxiety   . Back pain   . Constipation   . Depression   . Fatty liver   . GERD (gastroesophageal reflux disease)   . Hyperlipidemia   . Infertility, female   . Joint pain   . Kidney problem   . Membranous nephrosis   . Multiple food allergies   . Muscle pain   . Numbness and tingling in both hands   . Pre-diabetes   . Shortness of breath   . Sleep apnea   . Stress   . Swelling of both lower extremities     Past Surgical History:  Procedure Laterality Date  . FINGER SURGERY Left     index finger  . KNEE SURGERY Left   . LIVER BIOPSY N/A 05/15/2014   Procedure: LIVER BIOPSY;  Surgeon: Louis Meckel, MD;  Location: [facility];  Service: Endoscopy;  Laterality: N/A;  ultrasound to mark the liver  . TONSILLECTOMY AND ADENOIDECTOMY  1978  . TYMPANOSTOMY TUBE PLACEMENT    . wrist cyst Left   . WRIST SURGERY     left - cyst removed    Family History  Problem Relation Age of Onset  . Diabetes Mother   . Liver disease Mother        ESLD- unknown etiology  . Obesity Mother   . Diabetes Maternal Grandmother   . Diabetes Paternal  Grandmother   . Breast cancer Paternal Grandmother   . Stroke Maternal Grandfather   . Hyperlipidemia Father     Social History   Socioeconomic History  . Marital status: Married    Spouse name: [name]  . Number of children: 5  . Years of education: in [employer] program  . Highest education level: Bachelor's degree (e.g., BA, AB, BS)  Occupational History  . Occupation: Magazine features editor: [employer]    Comment: ESL  Social Needs  . Financial resource strain: Not on file  . Food insecurity:    Worry: Not on file    Inability: Not on file  . Transportation needs:    Medical: Not on file    Non-medical: Not on file  Tobacco Use  . Smoking status: Never Smoker  . Smokeless tobacco: Never Used  Substance and Sexual Activity  . Alcohol use: No    Alcohol/week: 0.0 standard drinks  . Drug use: No  . Sexual activity: Yes    Partners: Male    Birth control/protection: Post-menopausal  Lifestyle  . Physical activity:    Days per week: Not on file    Minutes per session: Not on file  . Stress: Not on file  Relationships  . Social connections:    Talks on phone: Not on file    Gets together: Not on file    Attends religious service: Not on file    Active member of club or organization: Not on file    Attends meetings of clubs or organizations: Not on file    Relationship status: Not on file  . Intimate partner violence:    Fear of current or ex partner: Not on file    Emotionally abused: Not on file    Physically abused: Not on file    Forced sexual activity: Not on file  Other Topics Concern  . Not on file  Social History Narrative   Lives with her husband and their 5 adpoted children.    Adopted 2 sets of siblings, all with psychiatric diagnoses (ADHD, Asperger's, ODD, PTSD, depression, bipolar disorder).   Her husband has Asperger's, and works in disability services.   Occasionally drinks coke or pepsi     Outpatient Medications Prior to Visit    Medication Sig Dispense Refill  . buPROPion (WELLBUTRIN SR) 150 MG 12 hr tablet Take 1 tablet (150 mg total) by mouth 2 (two) times daily. 60 tablet 0  . busPIRone (BUSPAR) 15 MG tablet TAKE ONE TABLET BY MOUTH TWICE A DAY OR AS DIRECTED. NEEDS OFFICE VISIT. 60 tablet 0  . cetirizine (ZYRTEC) 10 MG tablet Take 10 mg by mouth daily.    . chlorthalidone (HYGROTON) 25 MG tablet Take 1 tablet (25 mg total) by mouth daily. 30 tablet 0  . citalopram (CELEXA) 20 MG tablet TAKE ONE TABLET BY MOUTH DAILY 90 tablet 3  . diclofenac (VOLTAREN) 50 MG EC tablet Take 50 mg by mouth 2 (two) times daily.    . fluticasone (FLONASE) 50 MCG/ACT nasal spray Place 2 sprays into both nostrils daily. 48 g 3  . levothyroxine (SYNTHROID, LEVOTHROID) 75 MCG tablet TAKE ONE TABLET BY MOUTH DAILY 30 tablet 0  . LORazepam (ATIVAN) 1 MG tablet Take 1 tablet (1 mg total) by mouth 2 (two) times daily as needed for anxiety. 20 tablet 0  . metFORMIN (GLUCOPHAGE) 500 MG tablet TAKE ONE TABLET BY MOUTH DAILY WITH BREAKFAST 30 tablet 0  . montelukast (SINGULAIR) 10 MG tablet Take 1 tablet (10 mg total) by mouth at bedtime. 90 tablet 3  . Multiple Vitamin (MULTIVITAMIN) tablet Take 1 tablet by mouth daily.    . Omega-3 Fatty Acids (FISH OIL PO) Take by mouth daily.    . Vitamin D, Ergocalciferol, (DRISDOL) 1.25 MG (50000 UT) CAPS capsule TAKE ONE CAPSULE BY MOUTH TWICE WEEKLY 10 capsule 0  . atorvastatin (LIPITOR) 20 MG tablet     . diclofenac (VOLTAREN) 75 MG EC tablet Take 1 tablet (75 mg total) by mouth 2 (two) times daily. (Patient not taking: Reported on 02/18/2018) 50 tablet 0   Facility-Administered Medications Prior to Visit  Medication Dose Route Frequency Provider Last Rate Last Dose  . cefTRIAXone (ROCEPHIN) injection 1 g  1 g Intramuscular Once Myles Lipps, MD        Allergies  Allergen Reactions  . Lidocaine Other (See Comments)    Flu like sx's  . Relafen [Nabumetone] Other (See Comments)    migraine  .  Celebrex [Celecoxib] Rash  . Sulfa Antibiotics Rash    ROS  Review of Systems Review of Systems  Constitutional: Negative for activity change, appetite change, chills and fever.  HENT: Negative for congestion, nosebleeds, trouble swallowing and voice change.   Respiratory: Negative for cough, shortness of breath and wheezing.   Gastrointestinal: Negative for diarrhea, nausea and vomiting.  Genitourinary: Negative for difficulty urinating, dysuria, flank pain and hematuria.  Musculoskeletal: Negative for back pain, joint swelling and neck pain.  Neurological: Negative for dizziness, speech difficulty, light-headedness and numbness.  See HPI. All other review of systems negative.     Objective:    Physical Exam  BP 113/74   Pulse 72   Temp 98.1 F (36.7 C) (Oral)   Resp 18   Ht 5' 8.9" (1.75 m)   Wt (!) 331 lb 9.6 oz (150.4 kg)   LMP 02/17/2018 (Approximate)   SpO2 96%   BMI 49.11 kg/m  Wt Readings from Last 3 Encounters:  02/18/18 (!) 331 lb 9.6 oz (150.4 kg)  02/04/18 (!) 319 lb (144.7 kg)  01/23/18 (!) 330 lb 3.2 oz (149.8 kg)   Physical Exam  Constitutional: Oriented to person, place, and time. Appears well-developed and well-nourished.  HENT:  Head: Normocephalic and atraumatic.  Eyes: Conjunctivae and EOM are normal.  Cardiovascular: Normal rate, regular rhythm, normal heart sounds and intact distal pulses.  No murmur heard. Pulmonary/Chest: Effort normal and breath sounds normal. No stridor. No respiratory distress. Has no wheezes.  Neurological: Is alert and oriented to person, place, and time.  Skin: Skin is warm. Capillary refill takes less than 2 seconds.  Psychiatric: Has a normal mood and affect. Behavior is normal. Judgment and thought content normal.   Musculoskeletal: Right elbow tenderness along the lateral epicondyle Effusion and erythema noted Reduced grip strength on the right    There are no preventive care reminders to display for this  patient.  There are no preventive care reminders to display for this patient.  Lab Results  Component Value Date   TSH 5.570 (H) 12/13/2017   Lab Results  Component Value Date   WBC 7.2 05/23/2017   HGB 14.7 05/23/2017   HCT 43.3 05/23/2017   MCV 93 05/23/2017   PLT 213 04/05/2017   Lab Results  Component Value Date   NA 140 12/13/2017   K 4.0 12/13/2017   CO2 28 12/13/2017   GLUCOSE 124 (H) 12/13/2017   BUN 17 12/13/2017   CREATININE 0.89 12/13/2017   BILITOT 0.8 12/13/2017   ALKPHOS 139 (H) 12/13/2017   AST 41 (H) 12/13/2017   ALT 60 (H) 12/13/2017   PROT 5.9 (L) 12/13/2017   ALBUMIN 3.7 12/13/2017   CALCIUM 9.9 12/13/2017   Lab Results  Component Value Date   CHOL 383 (H) 12/13/2017   Lab Results  Component Value Date   HDL 58 12/13/2017   Lab Results  Component Value Date   LDLCALC 279 (H) 12/13/2017   Lab Results  Component Value Date   TRIG 229 (H) 12/13/2017   Lab Results  Component Value Date   CHOLHDL 6.6 (H) 12/13/2017   Lab Results  Component Value Date   HGBA1C 5.6 12/13/2017      Assessment & Plan:   Problem List Items Addressed This Visit    None    Visit Diagnoses    Right lateral epicondylitis    -  Primary   Relevant Medications   predniSONE (DELTASONE) 10 MG tablet     Advised pt to stop voltaren Will use prednisone Gave home care  Meds ordered this encounter  Medications  . predniSONE (DELTASONE) 10 MG tablet    Sig: Take 6 tablets (60 mg total) by mouth daily with breakfast for 1 day, THEN 5 tablets (50 mg total) daily with breakfast for 1 day, THEN 4 tablets (40 mg total) daily with breakfast for 1 day, THEN 3 tablets (30 mg total) daily with breakfast for 1 day, THEN 2 tablets (20 mg total) daily with breakfast for 1 day, THEN 1 tablet (10 mg total) daily with breakfast for 1 day.    Dispense:  21 tablet    Refill:  0    Follow-up: Return if symptoms worsen or fail to improve.    Doristine Bosworth, MD

## 2018-02-18 NOTE — Patient Instructions (Addendum)
If you have lab work done today you will be contacted with your lab results within the next 2 weeks.  If you have not heard from [place] then please contact us. The fastest way to get your results is to register for My Chart.   IF you received an x-ray today, you will receive an invoice from [facility]. Please contact [name] at [phone] with questions or concerns regarding your invoice.   IF you received labwork today, you will receive an invoice from [facility]. Please contact [facility] at [phone] with questions or concerns regarding your invoice.   Our billing staff will not be able to assist you with questions regarding bills from these companies.  You will be contacted with the lab results as soon as they are available. The fastest way to get your results is to activate your My Chart account. Instructions are located on the last page of this paperwork. If you have not heard from [place] regarding the results in 2 weeks, please contact this office.     Tennis Elbow Rehab Ask your health care provider which exercises are safe for you. Do exercises exactly as told by your health care provider and adjust them as directed. It is normal to feel mild stretching, pulling, tightness, or discomfort as you do these exercises, but you should stop right away if you feel sudden pain or your pain gets worse. Do not begin these exercises until told by your health care provider. Stretching and range of motion exercises These exercises warm up your muscles and joints and improve the movement and flexibility of your elbow. These exercises also help to relieve pain, numbness, and tingling. Exercise A: Wrist extensor stretch 1. Extend your left / right elbow with your fingers pointing down. 2. Gently pull the palm of your left / right hand toward you until you feel a gentle stretch on the top of your forearm. 3. To increase the stretch, push your left / right hand toward the outer edge  or pinkie side of your forearm. 4. Hold this position for __________ seconds. Repeat __________ times. Complete this exercise __________ times a day. If directed by your health care provider, repeat this stretch except do it with a bent elbow this time. Exercise B: Wrist flexor stretch  1. Extend your left / right elbow and turn your palm upward. 2. Gently pull your left / right palm and fingertips back so your wrist extends and your fingers point more toward the ground. 3. You should feel a gentle stretch on the inside of your forearm. 4. Hold this position for __________ seconds. Repeat __________ times. Complete this exercise __________ times a day. If directed by your health care provider, repeat this stretch except do it with a bent elbow this time. Strengthening exercises These exercises build strength and endurance in your elbow. Endurance is the ability to use your muscles for a long time, even after they get tired. Exercise C: Wrist extensors  1. Sit with your left / right forearm palm-down and fully supported on a table or countertop. Your elbow should be resting below the height of your shoulder. 2. Let your left / right wrist extend over the edge of the surface. 3. Loosely hold a __________ weight or a piece of rubber exercise band or tubing in your left / right hand. Slowly curl your left / right hand up toward your forearm. If you are using band or tubing, hold the band or tubing in place with  your other hand to provide resistance. 4. Hold this position for __________ seconds. 5. Slowly return to the starting position. Repeat __________ times. Complete this exercise __________ times a day. Exercise D: Radial deviators  1. Stand with a __________ weight in your left / righthand. Or, sit while holding a rubber exercise band or tubing with your other arm supported on a table or countertop. Position your hand so your thumb is on top. 2. Raise your hand upward in front of you so  your thumb travels toward your forearm, or pull up on the rubber tubing. 3. Hold this position for __________ seconds. 4. Slowly return to the starting position. Repeat __________ times. Complete this exercise __________ times a day. Exercise E: Eccentric wrist extensors 1. Sit with your left / right forearm palm-down and fully supported on a table or countertop. Your elbow should be resting below the height of your shoulder. 2. If told by your health care provider, hold a __________ weight in your hand. 3. Let your left / right wrist extend over the edge of the surface. 4. Use your other hand to lift up your left / right hand toward your forearm. Keep your forearm on the table. 5. Using only the muscles in your left / right hand, slowly lower your hand back down to the starting position. Repeat __________ times. Complete this exercise __________ times a day. This information is not intended to replace advice given to you by your health care provider. Make sure you discuss any questions you have with your health care provider. Document Released: 02/27/2005 Document Revised: 11/03/2015 Document Reviewed: 11/26/2014 Elsevier Interactive Patient Education  [employer].

## 2018-02-21 ENCOUNTER — Ambulatory Visit: Payer: BC Managed Care – PPO | Admitting: Podiatry

## 2018-02-21 ENCOUNTER — Encounter: Payer: Self-pay | Admitting: Podiatry

## 2018-02-21 DIAGNOSIS — M7662 Achilles tendinitis, left leg: Secondary | ICD-10-CM | POA: Diagnosis not present

## 2018-02-21 DIAGNOSIS — M779 Enthesopathy, unspecified: Secondary | ICD-10-CM

## 2018-02-21 DIAGNOSIS — M7661 Achilles tendinitis, right leg: Secondary | ICD-10-CM | POA: Diagnosis not present

## 2018-02-21 MED ORDER — TRIAMCINOLONE ACETONIDE 10 MG/ML IJ SUSP
10.0000 mg | Freq: Once | INTRAMUSCULAR | Status: AC
  Administered 2018-02-21: 10 mg

## 2018-02-21 NOTE — Progress Notes (Signed)
Subjective:   Patient ID: Melanie [name], female   DOB: 42 y.o.   MRN: [id number]   HPI Patient presents stating the back of the heel is moderately better in the top the foot is pretty good she has a lot of pain in her ankle joint right that is been inflamed and hard for her to walk with comfortably   ROS      Objective:  Physical Exam  Neurovascular status intact muscle strength is adequate patient found to have exquisite discomfort in the sinus tarsi right with the heel doing somewhat better and the dorsum of the foot reduced as far as discomfort     Assessment:  Sinus tarsitis right with Achilles tendinitis right still present with mild improvement but still quite sore     Plan:  Today I went ahead and I did do a sterile prep and injected the sinus tarsi 3 mg dexamethasone Kenalog 5 mg Xylocaine and then dispensed a silicone Achilles wrap to reduce stress on the Achilles and advised on continued boot usage with gradual reduction of the next few weeks.  Patient will be seen back again in 4 weeks and is picking her son up from the hospital where he is been in for psychological issues since July from [place]

## 2018-02-28 ENCOUNTER — Ambulatory Visit (INDEPENDENT_AMBULATORY_CARE_PROVIDER_SITE_OTHER): Payer: BC Managed Care – PPO | Admitting: Physician Assistant

## 2018-02-28 ENCOUNTER — Encounter (INDEPENDENT_AMBULATORY_CARE_PROVIDER_SITE_OTHER): Payer: Self-pay | Admitting: Physician Assistant

## 2018-02-28 VITALS — BP 131/77 | HR 73 | Temp 98.2°F | Ht 68.0 in | Wt 323.0 lb

## 2018-02-28 DIAGNOSIS — I10 Essential (primary) hypertension: Secondary | ICD-10-CM

## 2018-02-28 DIAGNOSIS — R7303 Prediabetes: Secondary | ICD-10-CM

## 2018-02-28 DIAGNOSIS — T7840XS Allergy, unspecified, sequela: Secondary | ICD-10-CM

## 2018-02-28 DIAGNOSIS — E038 Other specified hypothyroidism: Secondary | ICD-10-CM | POA: Diagnosis not present

## 2018-02-28 DIAGNOSIS — E559 Vitamin D deficiency, unspecified: Secondary | ICD-10-CM

## 2018-02-28 DIAGNOSIS — Z6841 Body Mass Index (BMI) 40.0 and over, adult: Secondary | ICD-10-CM

## 2018-02-28 DIAGNOSIS — Z9189 Other specified personal risk factors, not elsewhere classified: Secondary | ICD-10-CM

## 2018-02-28 DIAGNOSIS — F419 Anxiety disorder, unspecified: Secondary | ICD-10-CM

## 2018-02-28 MED ORDER — CITALOPRAM HYDROBROMIDE 20 MG PO TABS: ORAL_TABLET | ORAL | 0 refills | Status: DC

## 2018-02-28 MED ORDER — METFORMIN HCL 500 MG PO TABS: 500.0000 mg | ORAL_TABLET | Freq: Every day | ORAL | 0 refills | Status: DC

## 2018-02-28 MED ORDER — CHLORTHALIDONE 25 MG PO TABS: 25.0000 mg | ORAL_TABLET | Freq: Every day | ORAL | 0 refills | Status: DC

## 2018-02-28 MED ORDER — MONTELUKAST SODIUM 10 MG PO TABS: 10.0000 mg | ORAL_TABLET | Freq: Every day | ORAL | 0 refills | Status: DC

## 2018-02-28 MED ORDER — BUPROPION HCL ER (SR) 150 MG PO TB12: 150.0000 mg | ORAL_TABLET | Freq: Two times a day (BID) | ORAL | 0 refills | Status: DC

## 2018-02-28 MED ORDER — VITAMIN D (ERGOCALCIFEROL) 1.25 MG (50000 UNIT) PO CAPS: ORAL_CAPSULE | ORAL | 0 refills | Status: DC

## 2018-02-28 MED ORDER — LEVOTHYROXINE SODIUM 75 MCG PO TABS: 75.0000 ug | ORAL_TABLET | Freq: Every day | ORAL | 0 refills | Status: DC

## 2018-03-04 ENCOUNTER — Other Ambulatory Visit: Payer: Self-pay | Admitting: Family Medicine

## 2018-03-04 ENCOUNTER — Other Ambulatory Visit (INDEPENDENT_AMBULATORY_CARE_PROVIDER_SITE_OTHER): Payer: Self-pay | Admitting: Physician Assistant

## 2018-03-04 DIAGNOSIS — F419 Anxiety disorder, unspecified: Secondary | ICD-10-CM

## 2018-03-04 DIAGNOSIS — E559 Vitamin D deficiency, unspecified: Secondary | ICD-10-CM

## 2018-03-04 NOTE — Telephone Encounter (Signed)
Copied from [place] [phone]. Topic: Quick Communication - Rx Refill/Question >> Mar 04, 2018  5:08 PM Mcneil, Ja-Kwan wrote: Medication: busPIRone (BUSPAR) 15 MG tablet  Has the patient contacted their pharmacy? yes   Preferred Pharmacy (with phone number or street name): [place], [place] - [place] [phone] (Phone)  [phone] (Fax)  Agent: Please be advised that RX refills may take up to 3 business days. We ask that you follow-up with your pharmacy.

## 2018-03-04 NOTE — Telephone Encounter (Signed)
Requested medication (s) are due for refill today: yes  Requested medication (s) are on the active medication list: yes  Last refill:  02/03/18 #60; medication previously filled by a different provider  Future visit scheduled: yes  Notes to clinic:  Unable to refill per protocol. Medication ordered by a different provider    Requested Prescriptions  Pending Prescriptions Disp Refills   buPROPion (WELLBUTRIN SR) 150 MG 12 hr tablet 60 tablet 0    Sig: Take 1 tablet (150 mg total) by mouth 2 (two) times daily.     Psychiatry: Antidepressants - bupropion Passed - 03/04/2018  5:55 PM      Passed - Completed PHQ-2 or PHQ-9 in the last 360 days.      Passed - Last BP in normal range    BP Readings from Last 1 Encounters:  02/28/18 131/77         Passed - Valid encounter within last 6 months    Recent Outpatient Visits          2 weeks ago Right lateral epicondylitis   Primary Care at [facility], Arlie Solomons, MD   1 month ago Chronic pain of both feet   Primary Care at [facility], Ines Bloomer, MD   9 months ago Foot sprain, left, initial encounter   Primary Care at [place], [facility] [place]   10 months ago Acute pyelonephritis   Primary Care at [place], [place]   11 months ago Acute pyelonephritis   [facility] at [facility], Lilia Argue, MD      Future Appointments            In 1 month Forrest Moron, MD Primary Care at [place], [facility]

## 2018-03-04 NOTE — Progress Notes (Signed)
Office: [phone]  /  Fax: [phone]   HPI:   Chief Complaint: OBESITY Melanie [name] is here to discuss her progress with her obesity treatment plan. She is on the Category 3 plan and is following her eating plan approximately 50 % of the time. She states she is exercising 0 minutes 0 times per week. Melanie [name] reports that she hurt her arm recently and was placed on prednisone for one week. She did not eat on her plan at that time, but she states that she is ready to get back on track. Her weight is (!) 323 lb (146.5 kg) today and has had a weight gain of 4 pounds over a period of 3 weeks since her last visit. She has lost 15 lbs since starting treatment with [place].  Pre-Diabetes Melanie [name] has a diagnosis of prediabetes based on her elevated Hgb A1c and was informed this puts her at greater risk of developing diabetes. She is taking metformin currently and continues to work on diet and exercise to decrease risk of diabetes. She denies nausea or hypoglycemia.  At risk for diabetes Melanie [name] is at higher than average risk for developing diabetes due to her obesity and prediabetes. She currently denies polyuria or polydipsia.  Hypertension Melanie [name] is a 42 y.o. female with hypertension.  Melanie [name] denies chest pain. She is working weight loss to help control her blood pressure with the goal of decreasing her risk of heart attack and stroke. Melanie [name] blood pressure is currently controlled.  Hypothyroidism Melanie [name] has a diagnosis of hypothyroidism. She is on levothyroxine. She denies hot or cold intolerance.   Seasonal Allergies (J30.2) Melanie [name] has a history of seasonal allergies. Melanie [name] is on montelukast and she denies any current symptoms.  Anxiety Melanie [name] reports intermittent anxiety. This is controlled on medications.  Vitamin D deficiency Melanie [name] has a diagnosis of vitamin D deficiency. She is currently taking vit D and denies nausea, vomiting or muscle weakness.  ASSESSMENT AND PLAN:  Prediabetes - Plan:  metFORMIN (GLUCOPHAGE) 500 MG tablet  Essential hypertension - Plan: chlorthalidone (HYGROTON) 25 MG tablet  Other specified hypothyroidism - Plan: levothyroxine (SYNTHROID, LEVOTHROID) 75 MCG tablet  Vitamin D deficiency - Plan: Vitamin D, Ergocalciferol, (DRISDOL) 1.25 MG (50000 UT) CAPS capsule  Severe allergy, sequela - Plan: montelukast (SINGULAIR) 10 MG tablet  Anxiety - Plan: buPROPion (WELLBUTRIN SR) 150 MG 12 hr tablet, citalopram (CELEXA) 20 MG tablet  At risk for diabetes mellitus  Class 3 severe obesity with serious comorbidity and body mass index (BMI) of 45.0 to 49.9 in adult, unspecified obesity type ([place])  PLAN:  Pre-Diabetes Melanie [name] will continue to work on weight loss, exercise, and decreasing simple carbohydrates in her diet to help decrease the risk of diabetes. We dicussed metformin including benefits and risks. She was informed that eating too many simple carbohydrates or too many calories at one sitting increases the likelihood of GI side effects. Melanie [name] agreed to continue metformin for now and a prescription was written today for 1 month refill. Melanie [name] agreed to follow up with [place] as directed to monitor her progress.  Diabetes risk counseling Melanie [name] was given extended (15 minutes) diabetes prevention counseling today. She is 42 y.o. female and has risk factors for diabetes including obesity and prediabetes. We discussed intensive lifestyle modifications today with an emphasis on weight loss as well as increasing exercise and decreasing simple carbohydrates in her diet.  Hypertension We discussed sodium restriction, working on healthy weight loss, and a regular exercise program as  the means to achieve improved blood pressure control. Melanie [name] agreed with this plan and agreed to follow up as directed. We will continue to monitor her blood pressure as well as her progress with the above lifestyle modifications. She agreed to continue chlorthalidone 25 mg qd #30 with no refills and  will watch for signs of hypotension as she continues her lifestyle modifications.  Hypothyroidism Melanie [name] was informed of the importance of good thyroid control to help with weight loss efforts. She was also informed that supertheraputic thyroid levels are dangerous and will not improve weight loss results. She agrees to continue levothyroxine 75 mcg qd #30 with no refills and follow up as directed.  Seasonal Allergies (J30.2) Melanie [name] agrees to continue montelukast 10 mg by mouth at bedtime #30 with no refills and follow up as directed.  Anxiety Melanie [name] agreed to continue citalopram 20 mg qd #30 with no refills and Buspirone 15 mg BID #60 with no refills and follow up as directed.  Vitamin D Deficiency Melanie [name] was informed that low vitamin D levels contributes to fatigue and are associated with obesity, breast, and colon cancer. She agrees to continue to take prescription Vit D @50 ,000 IU twice weekly #10 with no refills and will follow up for routine testing of vitamin D, at least 2-3 times per year. She was informed of the risk of over-replacement of vitamin D and agrees to not increase her dose unless she discusses this with [place] first. Melanie [name] agreed to follow up as directed.  Obesity Melanie [name] is currently in the action stage of change. As such, her goal is to continue with weight loss efforts She has agreed to follow the Category 3 plan Melanie [name] has been instructed to work up to a goal of 150 minutes of combined cardio and strengthening exercise per week for weight loss and overall health benefits. We discussed the following Behavioral Modification Strategies today: work on meal planning and easy cooking plans, holiday eating strategies and celebration eating strategies  Melanie [name] has agreed to follow up with our clinic in 3 weeks. She was informed of the importance of frequent follow up visits to maximize her success with intensive lifestyle modifications for her multiple health conditions.  ALLERGIES: Allergies    Allergen Reactions  . Lidocaine Other (See Comments)    Flu like sx's  . Relafen [Nabumetone] Other (See Comments)    migraine  . Celebrex [Celecoxib] Rash  . Sulfa Antibiotics Rash    MEDICATIONS: Current Outpatient Medications on File Prior to Visit  Medication Sig Dispense Refill  . atorvastatin (LIPITOR) 20 MG tablet     . busPIRone (BUSPAR) 15 MG tablet TAKE ONE TABLET BY MOUTH TWICE A DAY OR AS DIRECTED. NEEDS OFFICE VISIT. 60 tablet 0  . cetirizine (ZYRTEC) 10 MG tablet Take 10 mg by mouth daily.    . diclofenac (VOLTAREN) 50 MG EC tablet Take 50 mg by mouth 2 (two) times daily.    . diclofenac (VOLTAREN) 75 MG EC tablet Take 1 tablet (75 mg total) by mouth 2 (two) times daily. (Patient not taking: Reported on 02/18/2018) 50 tablet 0  . fluticasone (FLONASE) 50 MCG/ACT nasal spray Place 2 sprays into both nostrils daily. 48 g 3  . LORazepam (ATIVAN) 1 MG tablet Take 1 tablet (1 mg total) by mouth 2 (two) times daily as needed for anxiety. 20 tablet 0  . Multiple Vitamin (MULTIVITAMIN) tablet Take 1 tablet by mouth daily.    . Omega-3 Fatty Acids (FISH OIL PO) Take by mouth  daily.     Current Facility-Administered Medications on File Prior to Visit  Medication Dose Route Frequency Provider Last Rate Last Dose  . cefTRIAXone (ROCEPHIN) injection 1 g  1 g Intramuscular Once Rutherford Guys, MD        PAST MEDICAL HISTORY: Past Medical History:  Diagnosis Date  . Allergy   . Anemia   . Anxiety   . Back pain   . Constipation   . Depression   . Fatty liver   . GERD (gastroesophageal reflux disease)   . Hyperlipidemia   . Infertility, female   . Joint pain   . Kidney problem   . Membranous nephrosis   . Multiple food allergies   . Muscle pain   . Numbness and tingling in both hands   . Pre-diabetes   . Shortness of breath   . Sleep apnea   . Stress   . Swelling of both lower extremities     PAST SURGICAL HISTORY: Past Surgical History:  Procedure Laterality  Date  . FINGER SURGERY Left     index finger  . KNEE SURGERY Left   . LIVER BIOPSY N/A 05/15/2014   Procedure: LIVER BIOPSY;  Surgeon: Inda Castle, MD;  Location: [facility];  Service: Endoscopy;  Laterality: N/A;  ultrasound to mark the liver  . TONSILLECTOMY AND ADENOIDECTOMY  1978  . TYMPANOSTOMY TUBE PLACEMENT    . wrist cyst Left   . WRIST SURGERY     left - cyst removed    SOCIAL HISTORY: Social History   Tobacco Use  . Smoking status: Never Smoker  . Smokeless tobacco: Never Used  Substance Use Topics  . Alcohol use: No    Alcohol/week: 0.0 standard drinks  . Drug use: No    FAMILY HISTORY: Family History  Problem Relation Age of Onset  . Diabetes Mother   . Liver disease Mother        ESLD- unknown etiology  . Obesity Mother   . Diabetes Maternal Grandmother   . Diabetes Paternal Grandmother   . Breast cancer Paternal Grandmother   . Stroke Maternal Grandfather   . Hyperlipidemia Father     ROS: Review of Systems  Constitutional: Negative for weight loss.  Cardiovascular: Negative for chest pain.  Gastrointestinal: Negative for diarrhea, nausea and vomiting.  Genitourinary: Negative for frequency.  Musculoskeletal:       Negative for muscle weakness  Endo/Heme/Allergies: Negative for polydipsia.       Negative for hypoglycemia Negative for polyphagia Negative for heat or cold intolerance  Psychiatric/Behavioral: The patient is nervous/anxious.     PHYSICAL EXAM: Blood pressure 131/77, pulse 73, temperature 98.2 F (36.8 C), temperature source Oral, height 5' 8"  (1.727 m), weight (!) 323 lb (146.5 kg), last menstrual period 02/17/2018, SpO2 99 %. Body mass index is 49.11 kg/m. Physical Exam Vitals signs reviewed.  Constitutional:      Appearance: Normal appearance. She is well-developed. She is obese.  Cardiovascular:     Rate and Rhythm: Normal rate.  Pulmonary:     Effort: Pulmonary effort is normal.  Musculoskeletal: Normal range of  motion.  Skin:    General: Skin is warm and dry.  Neurological:     Mental Status: She is alert and oriented to person, place, and time.  Psychiatric:        Mood and Affect: Mood normal.        Behavior: Behavior normal.     RECENT LABS AND TESTS: BMET  Component Value Date/Time   NA 140 12/13/2017 1014   K 4.0 12/13/2017 1014   CL 99 12/13/2017 1014   CO2 28 12/13/2017 1014   GLUCOSE 124 (H) 12/13/2017 1014   GLUCOSE 118 (H) 09/08/2015 1514   BUN 17 12/13/2017 1014   CREATININE 0.89 12/13/2017 1014   CREATININE 0.85 09/08/2015 1514   CALCIUM 9.9 12/13/2017 1014   GFRNONAA 80 12/13/2017 1014   GFRNONAA 85 09/08/2015 1514   GFRAA 92 12/13/2017 1014   GFRAA >89 09/08/2015 1514   Lab Results  Component Value Date   HGBA1C 5.6 12/13/2017   HGBA1C 5.0 09/05/2017   HGBA1C 5.8 (H) 05/23/2017   HGBA1C 5.7 04/12/2017   HGBA1C 5.8 (H) 05/19/2015   Lab Results  Component Value Date   INSULIN 39.0 (H) 12/13/2017   INSULIN 24.3 09/05/2017   INSULIN 30.4 (H) 05/23/2017   CBC    Component Value Date/Time   WBC 7.2 05/23/2017 1249   WBC 8.3 08/27/2015 1633   RBC 4.66 05/23/2017 1249   RBC 4.27 08/27/2015 1633   HGB 14.7 05/23/2017 1249   HCT 43.3 05/23/2017 1249   PLT 213 04/05/2017 0947   MCV 93 05/23/2017 1249   MCH 31.5 05/23/2017 1249   MCH 31.1 08/27/2015 1633   MCHC 33.9 05/23/2017 1249   MCHC 34.3 08/27/2015 1633   RDW 13.6 05/23/2017 1249   LYMPHSABS 2.8 05/23/2017 1249   MONOABS 0.4 10/27/2013 1604   EOSABS 0.3 05/23/2017 1249   BASOSABS 0.0 05/23/2017 1249   Iron/TIBC/Ferritin/ %Sat    Component Value Date/Time   IRON 95 06/27/2013 1645   TIBC 371 06/27/2013 1645   FERRITIN 401 (H) 06/27/2013 1645   IRONPCTSAT 26 06/27/2013 1645   Lipid Panel     Component Value Date/Time   CHOL 383 (H) 12/13/2017 1014   TRIG 229 (H) 12/13/2017 1014   HDL 58 12/13/2017 1014   CHOLHDL 6.6 (H) 12/13/2017 1014   CHOLHDL 5.0 06/06/2013 1654   VLDL 27  06/06/2013 1654   LDLCALC 279 (H) 12/13/2017 1014   Hepatic Function Panel     Component Value Date/Time   PROT 5.9 (L) 12/13/2017 1014   ALBUMIN 3.7 12/13/2017 1014   AST 41 (H) 12/13/2017 1014   ALT 60 (H) 12/13/2017 1014   ALKPHOS 139 (H) 12/13/2017 1014   BILITOT 0.8 12/13/2017 1014   BILIDIR 0.2 04/07/2015 1349   IBILI 1.0 04/07/2015 1349      Component Value Date/Time   TSH 5.570 (H) 12/13/2017 1014   TSH 8.710 (H) 09/05/2017 0929   TSH 7.870 (H) 05/23/2017 1249    Ref. Range 12/13/2017 10:14  Vitamin D, 25-Hydroxy Latest Ref Range: 30.0 - 100.0 ng/mL 29.3 (L)     OBESITY BEHAVIORAL INTERVENTION VISIT  Today's visit was # 15   Starting weight: 338 lbs Starting date: 05/23/2017 Today's weight : 323 lbs Today's date: 02/28/2018 Total lbs lost to date: 15   ASK: We discussed the diagnosis of obesity with Melanie [name] today and Chamya agreed to give [place] permission to discuss obesity behavioral modification therapy today.  ASSESS: Melanie [name] has the diagnosis of obesity and her BMI today is 49.12 Melanie [name] is in the action stage of change   ADVISE: Melanie [name] was educated on the multiple health risks of obesity as well as the benefit of weight loss to improve her health. She was advised of the need for long term treatment and the importance of lifestyle modifications to improve her current health and  to decrease her risk of future health problems.  AGREE: Multiple dietary modification options and treatment options were discussed and  Melanie [name] agreed to follow the recommendations documented in the above note.  ARRANGE: Melanie [name] was educated on the importance of frequent visits to treat obesity as outlined per CMS and USPSTF guidelines and agreed to schedule her next follow up appointment today.  Corey Skains, am acting as transcriptionist for Abby Potash, PA-C I, Abby Potash, PA-C have reviewed above note and agree with its content

## 2018-03-13 ENCOUNTER — Other Ambulatory Visit: Payer: Self-pay | Admitting: Family Medicine

## 2018-03-13 DIAGNOSIS — F418 Other specified anxiety disorders: Secondary | ICD-10-CM

## 2018-03-13 DIAGNOSIS — F4323 Adjustment disorder with mixed anxiety and depressed mood: Secondary | ICD-10-CM

## 2018-03-18 ENCOUNTER — Ambulatory Visit: Payer: BC Managed Care – PPO | Admitting: Podiatry

## 2018-03-18 MED ORDER — BUSPIRONE HCL 15 MG PO TABS: ORAL_TABLET | ORAL | 0 refills | Status: DC

## 2018-03-22 ENCOUNTER — Other Ambulatory Visit (INDEPENDENT_AMBULATORY_CARE_PROVIDER_SITE_OTHER): Payer: Self-pay | Admitting: Physician Assistant

## 2018-03-22 DIAGNOSIS — E038 Other specified hypothyroidism: Secondary | ICD-10-CM

## 2018-03-22 DIAGNOSIS — R7303 Prediabetes: Secondary | ICD-10-CM

## 2018-03-25 MED ORDER — METFORMIN HCL 500 MG PO TABS: 500.0000 mg | ORAL_TABLET | Freq: Every day | ORAL | 0 refills | Status: DC

## 2018-03-25 MED ORDER — LEVOTHYROXINE SODIUM 75 MCG PO TABS: 75.0000 ug | ORAL_TABLET | Freq: Every day | ORAL | 0 refills | Status: DC

## 2018-04-01 ENCOUNTER — Ambulatory Visit (INDEPENDENT_AMBULATORY_CARE_PROVIDER_SITE_OTHER): Payer: BC Managed Care – PPO | Admitting: Physician Assistant

## 2018-04-01 ENCOUNTER — Encounter (INDEPENDENT_AMBULATORY_CARE_PROVIDER_SITE_OTHER): Payer: Self-pay | Admitting: Physician Assistant

## 2018-04-01 VITALS — BP 134/82 | HR 76 | Temp 98.1°F | Ht 68.0 in | Wt 329.0 lb

## 2018-04-01 DIAGNOSIS — F3289 Other specified depressive episodes: Secondary | ICD-10-CM | POA: Diagnosis not present

## 2018-04-01 DIAGNOSIS — R7303 Prediabetes: Secondary | ICD-10-CM | POA: Diagnosis not present

## 2018-04-01 DIAGNOSIS — Z9189 Other specified personal risk factors, not elsewhere classified: Secondary | ICD-10-CM

## 2018-04-01 DIAGNOSIS — Z6841 Body Mass Index (BMI) 40.0 and over, adult: Secondary | ICD-10-CM

## 2018-04-01 DIAGNOSIS — I10 Essential (primary) hypertension: Secondary | ICD-10-CM | POA: Diagnosis not present

## 2018-04-01 MED ORDER — METFORMIN HCL 500 MG PO TABS: 500.0000 mg | ORAL_TABLET | Freq: Every day | ORAL | 2 refills | Status: DC

## 2018-04-01 MED ORDER — BUPROPION HCL ER (SR) 150 MG PO TB12: 150.0000 mg | ORAL_TABLET | Freq: Two times a day (BID) | ORAL | 2 refills | Status: DC

## 2018-04-01 MED ORDER — CHLORTHALIDONE 25 MG PO TABS: 25.0000 mg | ORAL_TABLET | Freq: Every day | ORAL | 2 refills | Status: DC

## 2018-04-01 NOTE — Progress Notes (Signed)
Office: [phone]  /  Fax: [phone]   HPI:   Chief Complaint: OBESITY Melanie [name] is here to discuss her progress with her obesity treatment plan. She is on the Category 3 plan and is following her eating plan approximately 50 % of the time. She states she is exercising 0 minutes 0 times per week. Melanie [name] reports that she has struggled to stay on track with her eating due to lack of planning her meals. She is overeating her snack calories and under eating her protein.  Her weight is (!) 329 lb (149.2 kg) today and has gained 6 lbs since her last visit. She has lost 9 lbs since starting treatment with [place].  Pre-Diabetes Melanie [name] has a diagnosis of prediabetes based on her elevated Hgb A1c and was informed this puts her at greater risk of developing diabetes. She is taking metformin currently and denies nausea, vomiting or diarrhea. She continues to work on diet and exercise to decrease risk of diabetes. She denies hypoglycemia or polyphagia.  Depression with emotional eating behaviors Melanie [name] is struggling with emotional eating and using food for comfort to the extent that it is negatively impacting her health. She often snacks when she is not hungry. Melanie [name] sometimes feels she is out of control and then feels guilty that she made poor food choices. She has been working on behavior modification techniques to help reduce her emotional eating and has been somewhat successful. Melanie [name] reports that Bupropion is helping to control cravings.   She shows no sign of suicidal or homicidal ideations.  Depression screen Melanie [name] 2/9 02/18/2018 01/23/2018 05/23/2017 04/12/2017 04/05/2017  Decreased Interest 0 0 1 0 0  Down, Depressed, Hopeless 0 0 1 0 0  PHQ - 2 Score 0 0 2 0 0  Altered sleeping - - 2 - -  Tired, decreased energy - - 2 - -  Change in appetite - - 2 - -  Feeling bad or failure about yourself  - - 1 - -  Trouble concentrating - - 0 - -  Moving slowly or fidgety/restless - - 0 - -  Suicidal thoughts - - 0 - -    PHQ-9 Score - - 9 - -  Difficult doing work/chores - - Not difficult at all - -    Hypertension RAMANI RIVA is a 43 y.o. female with hypertension.  Melanie [name] denies chest pain. She is working weight loss to help control her blood pressure with the goal of decreasing her risk of heart attack and stroke. Lisas blood pressure is normal.  At risk for diabetes Melanie [name] is at higher than average risk for developing diabetes due to her obesity. She currently denies polyuria or polydipsia.  ASSESSMENT AND PLAN:  Prediabetes - Plan: metFORMIN (GLUCOPHAGE) 500 MG tablet  Essential hypertension - Plan: chlorthalidone (HYGROTON) 25 MG tablet  At risk for diabetes mellitus  Other depression - with emotional eating - Plan: buPROPion (WELLBUTRIN SR) 150 MG 12 hr tablet  Class 3 severe obesity with serious comorbidity and body mass index (BMI) of 50.0 to 59.9 in adult, unspecified obesity type [facility])  PLAN:  Pre-Diabetes Melanie [name] will continue to work on weight loss, exercise, and decreasing simple carbohydrates in her diet to help decrease the risk of diabetes. We dicussed metformin including benefits and risks. She was informed that eating too many simple carbohydrates or too many calories at one sitting increases the likelihood of GI side effects. Melanie [name] agrees to continue metformin 500 mg qd # 30 with  2 refills for now and a prescription was written today. Melanie [name] agrees to follow up with our clinic in 2 weeks.  Depression with Emotional Eating Behaviors We discussed behavior modification techniques today to help [name] deal with her emotional eating and depression. [name] agrees to continue taking bupropion SR 150 mg bid # 60 with 2 refills. Melanie [name] agrees to follow up with our clinic in 2 weeks.  Hypertension We discussed sodium restriction, working on healthy weight loss, and a regular exercise program as the means to achieve improved blood pressure control. Melanie [name] agreed with this plan and agreed to follow  up as directed. We will continue to monitor her blood pressure as well as her progress with the above lifestyle modifications. Melanie [name] agrees to continue taking chlorthalidone 25 mg qd #30 with no refills and will watch for signs of hypotension as she continues her lifestyle modifications.  Diabetes risk counseling Melanie [name] was given extended (15 minutes) diabetes prevention counseling today. She is 43 y.o. female and has risk factors for diabetes including obesity. We discussed intensive lifestyle modifications today with an emphasis on weight loss as well as increasing exercise and decreasing simple carbohydrates in her diet.  Obesity Melanie [name] is currently in the action stage of change. As such, her goal is to continue with weight loss efforts She has agreed to follow the Category 3 plan Melanie [name] has been instructed to work up to a goal of 150 minutes of combined cardio and strengthening exercise per week for weight loss and overall health benefits. We discussed the following Behavioral Modification Strategies today: increasing lean protein intake and keeping healthy foods in the house  Melanie [name] has agreed to follow up with our clinic in 2 weeks. She was informed of the importance of frequent follow up visits to maximize her success with intensive lifestyle modifications for her multiple health conditions.  ALLERGIES: Allergies  Allergen Reactions  . Lidocaine Other (See Comments)    Flu like sx's  . Relafen [Nabumetone] Other (See Comments)    migraine  . Celebrex [Celecoxib] Rash  . Sulfa Antibiotics Rash    MEDICATIONS: Current Outpatient Medications on File Prior to Visit  Medication Sig Dispense Refill  . atorvastatin (LIPITOR) 20 MG tablet     . busPIRone (BUSPAR) 15 MG tablet TAKE ONE TABLET BY MOUTH TWICE A DAY OR AS DIRECTED. NEEDS OFFICE VISIT. 90 tablet 0  . cetirizine (ZYRTEC) 10 MG tablet Take 10 mg by mouth daily.    . citalopram (CELEXA) 20 MG tablet TAKE ONE TABLET BY MOUTH DAILY 30  tablet 0  . diclofenac (VOLTAREN) 75 MG EC tablet Take 1 tablet (75 mg total) by mouth 2 (two) times daily. 50 tablet 0  . fluticasone (FLONASE) 50 MCG/ACT nasal spray Place 2 sprays into both nostrils daily. 48 g 3  . levothyroxine (SYNTHROID, LEVOTHROID) 75 MCG tablet Take 1 tablet (75 mcg total) by mouth daily. 30 tablet 0  . LORazepam (ATIVAN) 1 MG tablet Take 1 tablet (1 mg total) by mouth 2 (two) times daily as needed for anxiety. 20 tablet 0  . montelukast (SINGULAIR) 10 MG tablet Take 1 tablet (10 mg total) by mouth at bedtime. 30 tablet 0  . Multiple Vitamin (MULTIVITAMIN) tablet Take 1 tablet by mouth daily.    . Omega-3 Fatty Acids (FISH OIL PO) Take by mouth daily.    . Vitamin D, Ergocalciferol, (DRISDOL) 1.25 MG (50000 UT) CAPS capsule TAKE ONE CAPSULE BY MOUTH TWICE WEEKLY 10 capsule 0   Current Facility-Administered  Medications on File Prior to Visit  Medication Dose Route Frequency Provider Last Rate Last Dose  . cefTRIAXone (ROCEPHIN) injection 1 g  1 g Intramuscular Once Rutherford Guys, MD        PAST MEDICAL HISTORY: Past Medical History:  Diagnosis Date  . Allergy   . Anemia   . Anxiety   . Back pain   . Constipation   . Depression   . Fatty liver   . GERD (gastroesophageal reflux disease)   . Hyperlipidemia   . Infertility, female   . Joint pain   . Kidney problem   . Membranous nephrosis   . Multiple food allergies   . Muscle pain   . Numbness and tingling in both hands   . Pre-diabetes   . Shortness of breath   . Sleep apnea   . Stress   . Swelling of both lower extremities     PAST SURGICAL HISTORY: Past Surgical History:  Procedure Laterality Date  . FINGER [name] Left     index finger  . KNEE [name] Left   . LIVER BIOPSY N/A 05/15/2014   Procedure: LIVER BIOPSY;  Surgeon: Inda Castle, MD;  Location: [facility];  Service: Endoscopy;  Laterality: N/A;  ultrasound to mark the liver  . TONSILLECTOMY AND ADENOIDECTOMY  1978  .  TYMPANOSTOMY TUBE PLACEMENT    . wrist cyst Left   . WRIST [name]     left - cyst removed    SOCIAL HISTORY: Social History   Tobacco Use  . Smoking status: Never Smoker  . Smokeless tobacco: Never Used  Substance Use Topics  . Alcohol use: No    Alcohol/week: 0.0 standard drinks  . Drug use: No    FAMILY HISTORY: Family History  Problem Relation Age of Onset  . Diabetes Mother   . Liver disease Mother        ESLD- unknown etiology  . Obesity Mother   . Diabetes Maternal Grandmother   . Diabetes Paternal Grandmother   . Breast cancer Paternal Grandmother   . Stroke Maternal Grandfather   . Hyperlipidemia Father     ROS: Review of Systems  Constitutional: Negative for weight loss.  Cardiovascular: Negative for chest pain.  Gastrointestinal: Negative for nausea and vomiting.  Genitourinary:       Negative for polydipsia  Endo/Heme/Allergies: Negative for polydipsia.       Negative for hypoglycemia Negative for polyphagia  Psychiatric/Behavioral: Positive for depression. Negative for suicidal ideas.    PHYSICAL EXAM: Blood pressure 134/82, pulse 76, temperature 98.1 F (36.7 C), temperature source Oral, height 5' 8"  (1.727 m), weight (!) 329 lb (149.2 kg), SpO2 94 %. Body mass index is 50.02 kg/m. Physical Exam Vitals signs reviewed.  Constitutional:      Appearance: Normal appearance. She is obese.  Cardiovascular:     Rate and Rhythm: Normal rate.     Pulses: Normal pulses.  Pulmonary:     Effort: Pulmonary effort is normal.  Musculoskeletal: Normal range of motion.  Skin:    General: Skin is warm and dry.  Neurological:     Mental Status: She is alert and oriented to person, place, and time.  Psychiatric:        Mood and Affect: Mood normal.        Behavior: Behavior normal.     RECENT LABS AND TESTS: BMET    Component Value Date/Time   NA 140 12/13/2017 1014   K 4.0 12/13/2017 1014  CL 99 12/13/2017 1014   CO2 28 12/13/2017 1014    GLUCOSE 124 (H) 12/13/2017 1014   GLUCOSE 118 (H) 09/08/2015 1514   BUN 17 12/13/2017 1014   CREATININE 0.89 12/13/2017 1014   CREATININE 0.85 09/08/2015 1514   CALCIUM 9.9 12/13/2017 1014   GFRNONAA 80 12/13/2017 1014   GFRNONAA 85 09/08/2015 1514   GFRAA 92 12/13/2017 1014   GFRAA >89 09/08/2015 1514   Lab Results  Component Value Date   HGBA1C 5.6 12/13/2017   HGBA1C 5.0 09/05/2017   HGBA1C 5.8 (H) 05/23/2017   HGBA1C 5.7 04/12/2017   HGBA1C 5.8 (H) 05/19/2015   Lab Results  Component Value Date   INSULIN 39.0 (H) 12/13/2017   INSULIN 24.3 09/05/2017   INSULIN 30.4 (H) 05/23/2017   CBC    Component Value Date/Time   WBC 7.2 05/23/2017 1249   WBC 8.3 08/27/2015 1633   RBC 4.66 05/23/2017 1249   RBC 4.27 08/27/2015 1633   HGB 14.7 05/23/2017 1249   HCT 43.3 05/23/2017 1249   PLT 213 04/05/2017 0947   MCV 93 05/23/2017 1249   MCH 31.5 05/23/2017 1249   MCH 31.1 08/27/2015 1633   MCHC 33.9 05/23/2017 1249   MCHC 34.3 08/27/2015 1633   RDW 13.6 05/23/2017 1249   LYMPHSABS 2.8 05/23/2017 1249   MONOABS 0.4 10/27/2013 1604   EOSABS 0.3 05/23/2017 1249   BASOSABS 0.0 05/23/2017 1249   Iron/TIBC/Ferritin/ %Sat    Component Value Date/Time   IRON 95 06/27/2013 1645   TIBC 371 06/27/2013 1645   FERRITIN 401 (H) 06/27/2013 1645   IRONPCTSAT 26 06/27/2013 1645   Lipid Panel     Component Value Date/Time   CHOL 383 (H) 12/13/2017 1014   TRIG 229 (H) 12/13/2017 1014   HDL 58 12/13/2017 1014   CHOLHDL 6.6 (H) 12/13/2017 1014   CHOLHDL 5.0 06/06/2013 1654   VLDL 27 06/06/2013 1654   LDLCALC 279 (H) 12/13/2017 1014   Hepatic Function Panel     Component Value Date/Time   PROT 5.9 (L) 12/13/2017 1014   ALBUMIN 3.7 12/13/2017 1014   AST 41 (H) 12/13/2017 1014   ALT 60 (H) 12/13/2017 1014   ALKPHOS 139 (H) 12/13/2017 1014   BILITOT 0.8 12/13/2017 1014   BILIDIR 0.2 04/07/2015 1349   IBILI 1.0 04/07/2015 1349      Component Value Date/Time   TSH 5.570 (H)  12/13/2017 1014   TSH 8.710 (H) 09/05/2017 0929   TSH 7.870 (H) 05/23/2017 1249      OBESITY BEHAVIORAL INTERVENTION VISIT  Today's visit was # 17   Starting weight: 383 lbs Starting date: 05/23/2017 Today's weight :: (!) 329 lbs Today's date: 04/01/2018 Total lbs lost to date: 9   ASK: We discussed the diagnosis of obesity with Melanie [name] today and Statia agreed to give [place] permission to discuss obesity behavioral modification therapy today.  ASSESS: Marrianne has the diagnosis of obesity and her BMI today is 50.04 Dayani is in the action stage of change   ADVISE: Eulene was educated on the multiple health risks of obesity as well as the benefit of weight loss to improve her health. She was advised of the need for long term treatment and the importance of lifestyle modifications to improve her current health and to decrease her risk of future health problems.  AGREE: Multiple dietary modification options and treatment options were discussed and  Odelia agreed to follow the recommendations documented in the above note.  ARRANGE:  Ricquel was educated on the importance of frequent visits to treat obesity as outlined per CMS and USPSTF guidelines and agreed to schedule her next follow up appointment today.  I, Tammy Wysor, am acting as Location manager for [employer], PA-C I, Abby Potash, PA-C have reviewed above note and agree with its content

## 2018-04-04 ENCOUNTER — Other Ambulatory Visit (INDEPENDENT_AMBULATORY_CARE_PROVIDER_SITE_OTHER): Payer: Self-pay | Admitting: Physician Assistant

## 2018-04-04 DIAGNOSIS — E559 Vitamin D deficiency, unspecified: Secondary | ICD-10-CM

## 2018-04-06 ENCOUNTER — Other Ambulatory Visit: Payer: Self-pay

## 2018-04-06 ENCOUNTER — Ambulatory Visit: Payer: BC Managed Care – PPO | Admitting: Family Medicine

## 2018-04-06 ENCOUNTER — Encounter: Payer: Self-pay | Admitting: Family Medicine

## 2018-04-06 VITALS — BP 120/80 | HR 74 | Temp 98.0°F | Ht 68.0 in | Wt 336.8 lb

## 2018-04-06 DIAGNOSIS — E785 Hyperlipidemia, unspecified: Secondary | ICD-10-CM | POA: Diagnosis not present

## 2018-04-06 DIAGNOSIS — F419 Anxiety disorder, unspecified: Secondary | ICD-10-CM

## 2018-04-06 DIAGNOSIS — Z789 Other specified health status: Secondary | ICD-10-CM

## 2018-04-06 DIAGNOSIS — E781 Pure hyperglyceridemia: Secondary | ICD-10-CM

## 2018-04-06 DIAGNOSIS — I10 Essential (primary) hypertension: Secondary | ICD-10-CM | POA: Diagnosis not present

## 2018-04-06 DIAGNOSIS — F4323 Adjustment disorder with mixed anxiety and depressed mood: Secondary | ICD-10-CM

## 2018-04-06 DIAGNOSIS — F418 Other specified anxiety disorders: Secondary | ICD-10-CM

## 2018-04-06 DIAGNOSIS — M7711 Lateral epicondylitis, right elbow: Secondary | ICD-10-CM

## 2018-04-06 DIAGNOSIS — Z760 Encounter for issue of repeat prescription: Secondary | ICD-10-CM

## 2018-04-06 MED ORDER — COLESEVELAM HCL 625 MG PO TABS: 625.0000 mg | ORAL_TABLET | Freq: Two times a day (BID) | ORAL | 2 refills | Status: DC

## 2018-04-06 MED ORDER — CITALOPRAM HYDROBROMIDE 20 MG PO TABS: ORAL_TABLET | ORAL | 3 refills | Status: DC

## 2018-04-06 MED ORDER — BUSPIRONE HCL 15 MG PO TABS: ORAL_TABLET | ORAL | 3 refills | Status: DC

## 2018-04-06 MED ORDER — LORAZEPAM 1 MG PO TABS: 1.0000 mg | ORAL_TABLET | Freq: Two times a day (BID) | ORAL | 0 refills | Status: DC | PRN

## 2018-04-06 NOTE — Patient Instructions (Addendum)
If the welchol medication does not help with your cholesterol then I will refer you to the lipid clinic.  Return to clinic in 3 months.      If you have lab work done today you will be contacted with your lab results within the next 2 weeks.  If you have not heard from [place] then please contact us. The fastest way to get your results is to register for My Chart.   IF you received an x-ray today, you will receive an invoice from [facility]. Please contact [facility] at [phone] with questions or concerns regarding your invoice.   IF you received labwork today, you will receive an invoice from [facility]. Please contact [facility] at [phone] with questions or concerns regarding your invoice.   Our billing staff will not be able to assist you with questions regarding bills from these companies.  You will be contacted with the lab results as soon as they are available. The fastest way to get your results is to activate your My Chart account. Instructions are located on the last page of this paperwork. If you have not heard from [place] regarding the results in 2 weeks, please contact this office.      Cholesterol Cholesterol is a white, waxy, fat-like substance that is needed by the human body in small amounts. The liver makes all the cholesterol we need. Cholesterol is carried from the liver by the blood through the blood vessels. Deposits of cholesterol (plaques) may build up on blood vessel (artery) walls. Plaques make the arteries narrower and stiffer. Cholesterol plaques increase the risk for heart attack and stroke. You cannot feel your cholesterol level even if it is very high. The only way to know that it is high is to have a blood test. Once you know your cholesterol levels, you should keep a record of the test results. Work with your health care provider to keep your levels in the desired range. What do the results mean?  Total cholesterol is a rough measure of all the  cholesterol in your blood.  LDL (low-density lipoprotein) is the "bad" cholesterol. This is the type that causes plaque to build up on the artery walls. You want this level to be low.  HDL (high-density lipoprotein) is the "good" cholesterol because it cleans the arteries and carries the LDL away. You want this level to be high.  Triglycerides are fat that the body can either burn for energy or store. High levels are closely linked to heart disease. What are the desired levels of cholesterol?  Total cholesterol below 200.  LDL below 100 for people who are at risk, below 70 for people at very high risk.  HDL above 40 is good. A level of 60 or higher is considered to be protective against heart disease.  Triglycerides below 150. How can I lower my cholesterol? Diet Follow your diet program as told by your health care provider.  Choose fish or white meat chicken and [place], roasted or baked. Limit fatty cuts of red meat, fried foods, and processed meats, such as sausage and lunch meats.  Eat lots of fresh fruits and vegetables.  Choose whole grains, beans, pasta, potatoes, and cereals.  Choose olive oil, corn oil, or canola oil, and use only small amounts.  Avoid butter, mayonnaise, shortening, or palm kernel oils.  Avoid foods with trans fats.  Drink skim or nonfat milk and eat low-fat or nonfat yogurt and cheeses. Avoid whole milk, cream, ice cream, egg yolks,  and full-fat cheeses.  Healthier desserts include angel food cake, ginger snaps, animal crackers, hard candy, popsicles, and low-fat or nonfat frozen yogurt. Avoid pastries, cakes, pies, and cookies.  Exercise  Follow your exercise program as told by your health care provider. A regular program: ? Helps to decrease LDL and raise HDL. ? Helps with weight control.  Do things that increase your activity level, such as gardening, walking, and taking the stairs.  Ask your health care provider about ways that you can be  more active in your daily life. Medicine  Take over-the-counter and prescription medicines only as told by your health care provider. ? Medicine may be prescribed by your health care provider to help lower cholesterol and decrease the risk for heart disease. This is usually done if diet and exercise have failed to bring down cholesterol levels. ? If you have several risk factors, you may need medicine even if your levels are normal. This information is not intended to replace advice given to you by your health care provider. Make sure you discuss any questions you have with your health care provider. Document Released: 11/22/2000 Document Revised: 09/25/2015 Document Reviewed: 08/28/2015 Elsevier Interactive Patient Education  [employer].

## 2018-04-06 NOTE — Progress Notes (Signed)
Established Patient Office Visit  Subjective:  Patient ID: Melanie [name], female    DOB: 1975-10-02  Age: 43 y.o. MRN: [id number]  CC:  Chief Complaint  Patient presents with  . MEDICATION QUESTIONS    atorvastin. has been off of it for 4-6 months on recommadtion from weight management     HPI Melanie [name] presents for  Right epicondylitis She was given a brace from [facility] for her right upper extremity pain She states that she will follow up for lateral epicondylitis She reports that she continues to have right elbow and now shoulder pain  Elevated transaminases dyslpidemia She was discontinued off the atorvastatin due to elevated transaminases  She was discontinued after her results 7 months ago On recheck her levels improved She reports that she is exercise   Lab Results  Component Value Date   HGBA1C 5.6 12/13/2017    Lab Results  Component Value Date   CHOL 383 (H) 12/13/2017   CHOL 242 (H) 09/05/2017   CHOL 245 (H) 05/23/2017   Lab Results  Component Value Date   HDL 58 12/13/2017   HDL 72 09/05/2017   HDL 77 05/23/2017   Lab Results  Component Value Date   LDLCALC 279 (H) 12/13/2017   LDLCALC 138 (H) 09/05/2017   LDLCALC 143 (H) 05/23/2017   Lab Results  Component Value Date   TRIG 229 (H) 12/13/2017   TRIG 159 (H) 09/05/2017   TRIG 127 05/23/2017   Lab Results  Component Value Date   CHOLHDL 6.6 (H) 12/13/2017   CHOLHDL 5.4 (H) 03/30/2017   CHOLHDL 5.0 06/06/2013   No results found for: LDLDIRECT   She reports that the buspar, celexa are helping her mood She states that she only uses ativan sparingly She is working on her weight and is not stress eating Wt Readings from Last 3 Encounters:  04/06/18 (!) 336 lb 12.8 oz (152.8 kg)  04/01/18 (!) 329 lb (149.2 kg)  02/28/18 (!) 323 lb (146.5 kg)    Depression screen [facility] 2/9 04/06/2018 02/18/2018 01/23/2018 05/23/2017 04/12/2017  Decreased Interest 0 0 0 1 0  Down,  Depressed, Hopeless 0 0 0 1 0  PHQ - 2 Score 0 0 0 2 0  Altered sleeping - - - 2 -  Tired, decreased energy - - - 2 -  Change in appetite - - - 2 -  Feeling bad or failure about yourself  - - - 1 -  Trouble concentrating - - - 0 -  Moving slowly or fidgety/restless - - - 0 -  Suicidal thoughts - - - 0 -  PHQ-9 Score - - - 9 -  Difficult doing work/chores - - - Not difficult at all -    Past Medical History:  Diagnosis Date  . Allergy   . Anemia   . Anxiety   . Back pain   . Constipation   . Depression   . Fatty liver   . GERD (gastroesophageal reflux disease)   . Hyperlipidemia   . Infertility, female   . Joint pain   . Kidney problem   . Membranous nephrosis   . Multiple food allergies   . Muscle pain   . Numbness and tingling in both hands   . Pre-diabetes   . Shortness of breath   . Sleep apnea   . Stress   . Swelling of both lower extremities     Past Surgical History:  Procedure Laterality Date  .  FINGER SURGERY Left     index finger  . KNEE SURGERY Left   . LIVER BIOPSY N/A 05/15/2014   Procedure: LIVER BIOPSY;  Surgeon: Louis Meckel, MD;  Location: [facility];  Service: Endoscopy;  Laterality: N/A;  ultrasound to mark the liver  . TONSILLECTOMY AND ADENOIDECTOMY  1978  . TYMPANOSTOMY TUBE PLACEMENT    . wrist cyst Left   . WRIST SURGERY     left - cyst removed    Family History  Problem Relation Age of Onset  . Diabetes Mother   . Liver disease Mother        ESLD- unknown etiology  . Obesity Mother   . Diabetes Maternal Grandmother   . Diabetes Paternal Grandmother   . Breast cancer Paternal Grandmother   . Stroke Maternal Grandfather   . Hyperlipidemia Father     Social History   Socioeconomic History  . Marital status: Married    Spouse name: [name]  . Number of children: 5  . Years of education: in [employer] program  . Highest education level: Bachelor's degree (e.g., BA, AB, BS)  Occupational History  . 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 Financial resource strain: Not on file  . Food insecurity:    Worry: Not on file    Inability: Not on file  . Transportation needs:    Medical: Not on file    Non-medical: Not on file  Tobacco Use  . Smoking status: Never Smoker  . Smokeless tobacco: Never Used  Substance and Sexual Activity  . Alcohol use: No    Alcohol/week: 0.0 standard drinks  . Drug use: No  . Sexual activity: Yes    Partners: Male    Birth control/protection: Post-menopausal  Lifestyle  . Physical activity:    Days per week: Not on file    Minutes per session: Not on file  . Stress: Not on file  Relationships  . Social connections:    Talks on phone: Not on file    Gets together: Not on file    Attends religious service: Not on file    Active member of club or organization: Not on file    Attends meetings of clubs or organizations: Not on file    Relationship status: Not on file  . Intimate partner violence:    Fear of current or ex partner: Not on file    Emotionally abused: Not on file    Physically abused: Not on file    Forced sexual activity: Not on file  Other Topics Concern  . Not on file  Social History Narrative   Lives with her husband and their 5 adpoted children.    Adopted 2 sets of siblings, all with psychiatric diagnoses (ADHD, Asperger's, ODD, PTSD, depression, bipolar disorder).   Her husband has Asperger's, and works in disability services.   Occasionally drinks coke or pepsi     Outpatient Medications Prior to Visit  Medication Sig Dispense Refill  . buPROPion (WELLBUTRIN SR) 150 MG 12 hr tablet Take 1 tablet (150 mg total) by mouth 2 (two) times daily. 60 tablet 2  . cetirizine (ZYRTEC) 10 MG tablet Take 10 mg by mouth daily.    . chlorthalidone (HYGROTON) 25 MG tablet Take 1 tablet (25 mg total) by mouth daily. 30 tablet 2  . diclofenac (VOLTAREN) 75 MG EC tablet Take 1 tablet (75 mg total) by mouth 2 (two)  times daily. 50 tablet 0  . fluticasone (FLONASE) 50 MCG/ACT nasal spray Place 2 sprays into both nostrils daily. 48 g 3  . levothyroxine (SYNTHROID, LEVOTHROID) 75 MCG tablet Take 1 tablet (75 mcg total) by mouth daily. 30 tablet 0  . metFORMIN (GLUCOPHAGE) 500 MG tablet Take 1 tablet (500 mg total) by mouth daily with breakfast. 30 tablet 2  . montelukast (SINGULAIR) 10 MG tablet Take 1 tablet (10 mg total) by mouth at bedtime. 30 tablet 0  . Multiple Vitamin (MULTIVITAMIN) tablet Take 1 tablet by mouth daily.    . Omega-3 Fatty Acids (FISH OIL PO) Take by mouth daily.    . Vitamin D, Ergocalciferol, (DRISDOL) 1.25 MG (50000 UT) CAPS capsule TAKE ONE CAPSULE BY MOUTH TWICE WEEKLY 10 capsule 0  . busPIRone (BUSPAR) 15 MG tablet TAKE ONE TABLET BY MOUTH TWICE A DAY OR AS DIRECTED. NEEDS OFFICE VISIT. 90 tablet 0  . citalopram (CELEXA) 20 MG tablet TAKE ONE TABLET BY MOUTH DAILY 30 tablet 0  . LORazepam (ATIVAN) 1 MG tablet Take 1 tablet (1 mg total) by mouth 2 (two) times daily as needed for anxiety. 20 tablet 0  . atorvastatin (LIPITOR) 20 MG tablet      Facility-Administered Medications Prior to Visit  Medication Dose Route Frequency Provider Last Rate Last Dose  . cefTRIAXone (ROCEPHIN) injection 1 g  1 g Intramuscular Once Myles Lipps, MD        Allergies  Allergen Reactions  . Lidocaine Other (See Comments)    Flu like sx's  . Relafen [Nabumetone] Other (See Comments)    migraine  . Celebrex [Celecoxib] Rash  . Sulfa Antibiotics Rash    ROS Review of Systems  Review of Systems  Constitutional: Negative for activity change, appetite change, chills and fever.  HENT: Negative for congestion, nosebleeds, trouble swallowing and voice change.   Respiratory: Negative for cough, shortness of breath and wheezing.   Gastrointestinal: Negative for diarrhea, nausea and vomiting.  Genitourinary: Negative for difficulty urinating, dysuria, flank pain and hematuria.  Musculoskeletal:  see hpi Neurological: Negative for dizziness, speech difficulty, light-headedness and numbness.  See HPI. All other review of systems negative.     Objective:    Physical Exam  BP 120/80 (BP Location: Right Arm, Patient Position: Sitting, Cuff Size: Large)   Pulse 74   Temp 98 F (36.7 C) (Oral)   Ht 5\' 8"  (1.727 m)   Wt (!) 336 lb 12.8 oz (152.8 kg)   SpO2 92%   BMI 51.21 kg/m  Wt Readings from Last 3 Encounters:  04/06/18 (!) 336 lb 12.8 oz (152.8 kg)  04/01/18 (!) 329 lb (149.2 kg)  02/28/18 (!) 323 lb (146.5 kg)   Physical Exam  Constitutional: Oriented to person, place, and time. Appears well-developed and well-nourished.  HENT:  Head: Normocephalic and atraumatic.  Eyes: Conjunctivae and EOM are normal.  Cardiovascular: Normal rate, regular rhythm, normal heart sounds and intact distal pulses.  No murmur heard. Pulmonary/Chest: Effort normal and breath sounds normal. No stridor. No respiratory distress. Has no wheezes.  Neurological: Is alert and oriented to person, place, and time.  Skin: Skin is warm. Capillary refill takes less than 2 seconds.  Psychiatric: Has a normal mood and affect. Behavior is normal. Judgment and thought content normal.    Health Maintenance Due  Topic Date Due  . PAP SMEAR-Modifier  03/11/1995    There are no preventive care reminders to display for this patient.  Lab Results  Component Value Date   TSH 5.570 (H) 12/13/2017   Lab Results  Component Value Date   WBC 7.2 05/23/2017   HGB 14.7 05/23/2017   HCT 43.3 05/23/2017   MCV 93 05/23/2017   PLT 213 04/05/2017   Lab Results  Component Value Date   NA 140 12/13/2017   K 4.0 12/13/2017   CO2 28 12/13/2017   GLUCOSE 124 (H) 12/13/2017   BUN 17 12/13/2017   CREATININE 0.89 12/13/2017   BILITOT 0.8 12/13/2017   ALKPHOS 139 (H) 12/13/2017   AST 41 (H) 12/13/2017   ALT 60 (H) 12/13/2017   PROT 5.9 (L) 12/13/2017   ALBUMIN 3.7 12/13/2017   CALCIUM 9.9 12/13/2017   Lab  Results  Component Value Date   CHOL 383 (H) 12/13/2017   Lab Results  Component Value Date   HDL 58 12/13/2017   Lab Results  Component Value Date   LDLCALC 279 (H) 12/13/2017   Lab Results  Component Value Date   TRIG 229 (H) 12/13/2017   Lab Results  Component Value Date   CHOLHDL 6.6 (H) 12/13/2017   Lab Results  Component Value Date   HGBA1C 5.6 12/13/2017      Assessment & Plan:   Problem List Items Addressed This Visit      Other   Depression with anxiety   Relevant Medications   busPIRone (BUSPAR) 15 MG tablet   LORazepam (ATIVAN) 1 MG tablet   citalopram (CELEXA) 20 MG tablet    Other Visit Diagnoses    Statin intolerance    -  Primary   Dyslipidemia    -  Elevated liver enzymes Advised pt to try welchol to bypass the liver and use the gut Discussed dietary modification    Relevant Medications   colesevelam (WELCHOL) 625 MG tablet   Other Relevant Orders   Lipid panel   CMP14+EGFR   Hypertriglyceridemia       Relevant Medications   colesevelam (WELCHOL) 625 MG tablet   Other Relevant Orders   Lipid panel   CMP14+EGFR   Essential hypertension    - Patient's blood pressure is at goal of 139/89 or less. Condition is stable. Continue current medications and treatment plan. I recommend that you exercise for 30-45 minutes 5 days a week. I also recommend a balanced diet with fruits and vegetables every day, lean meats, and little fried foods. The DASH diet (you can find this online) is a good example of this.    Relevant Medications   colesevelam (WELCHOL) 625 MG tablet   Other Relevant Orders   CMP14+EGFR   Adjustment disorder with mixed anxiety and depressed mood    -  Stable on current meds, refilled medications    Relevant Medications   busPIRone (BUSPAR) 15 MG tablet   Anxiety       Relevant Medications   busPIRone (BUSPAR) 15 MG tablet   LORazepam (ATIVAN) 1 MG tablet   citalopram (CELEXA) 20 MG tablet   Encounter for medication refill         Right epicondylitis Follow up with Orthopedics   Meds ordered this encounter  Medications  . colesevelam (WELCHOL) 625 MG tablet    Sig: Take 1 tablet (625 mg total) by mouth 2 (two) times daily with a meal.    Dispense:  60 tablet    Refill:  2  . busPIRone (BUSPAR) 15 MG tablet    Sig: TAKE ONE TABLET BY MOUTH TWICE A DAY OR AS DIRECTED.  Dispense:  180 tablet    Refill:  3  . LORazepam (ATIVAN) 1 MG tablet    Sig: Take 1 tablet (1 mg total) by mouth 2 (two) times daily as needed for anxiety.    Dispense:  20 tablet    Refill:  0  . citalopram (CELEXA) 20 MG tablet    Sig: TAKE ONE TABLET BY MOUTH DAILY    Dispense:  90 tablet    Refill:  3    Follow-up: Return in about 3 months (around 07/06/2018) for cholesterol follow up .    Doristine Bosworth, MD

## 2018-04-07 LAB — CMP14+EGFR
A/G RATIO: 1.6 (ref 1.2–2.2)
ALK PHOS: 155 IU/L — AB (ref 39–117)
ALT: 106 IU/L — ABNORMAL HIGH (ref 0–32)
AST: 65 IU/L — ABNORMAL HIGH (ref 0–40)
Albumin: 3.6 g/dL — ABNORMAL LOW (ref 3.8–4.8)
BILIRUBIN TOTAL: 0.8 mg/dL (ref 0.0–1.2)
BUN/Creatinine Ratio: 16 (ref 9–23)
BUN: 15 mg/dL (ref 6–24)
CHLORIDE: 99 mmol/L (ref 96–106)
CO2: 25 mmol/L (ref 20–29)
Calcium: 10.3 mg/dL — ABNORMAL HIGH (ref 8.7–10.2)
Creatinine, Ser: 0.93 mg/dL (ref 0.57–1.00)
GFR calc Af Amer: 86 mL/min/{1.73_m2} (ref >59)
GFR calc non Af Amer: 75 mL/min/{1.73_m2} (ref >59)
Globulin, Total: 2.2 g/dL (ref 1.5–4.5)
Glucose: 135 mg/dL — ABNORMAL HIGH (ref 65–99)
POTASSIUM: 4.1 mmol/L (ref 3.5–5.2)
Sodium: 140 mmol/L (ref 134–144)
Total Protein: 5.8 g/dL — ABNORMAL LOW (ref 6.0–8.5)

## 2018-04-07 LAB — LIPID PANEL
Chol/HDL Ratio: 6.1 ratio — ABNORMAL HIGH (ref 0.0–4.4)
Cholesterol, Total: 396 mg/dL — ABNORMAL HIGH (ref 100–199)
HDL: 65 mg/dL (ref >39)
LDL Calculated: 272 mg/dL — ABNORMAL HIGH (ref 0–99)
Triglycerides: 297 mg/dL — ABNORMAL HIGH (ref 0–149)
VLDL Cholesterol Cal: 59 mg/dL — ABNORMAL HIGH (ref 5–40)

## 2018-04-07 NOTE — Addendum Note (Signed)
Addended by: Delia Chimes A on: 04/07/2018 02:41 PM   Modules accepted: Orders

## 2018-04-12 ENCOUNTER — Telehealth: Payer: Self-pay | Admitting: Family Medicine

## 2018-04-12 NOTE — Telephone Encounter (Unsigned)
Copied from [place] [phone]. Topic: General - Other >> Apr 12, 2018  3:45 PM Windy Kalata wrote: Reason for CRM: Patient is asking if she can have a lab order put in to see if she has the flu  Best call back is [phone]

## 2018-04-12 NOTE — Telephone Encounter (Signed)
Copied from [place] [id number]. Topic: General - Other >> Apr 12, 2018  3:45 PM Windy Kalata wrote: Reason for CRM: Patient is asking if she can have a lab order put in to see if she has the flu  Best call back is [phone]

## 2018-04-15 NOTE — Telephone Encounter (Signed)
Called and LVM for pt to call office back about questions

## 2018-04-16 ENCOUNTER — Encounter (INDEPENDENT_AMBULATORY_CARE_PROVIDER_SITE_OTHER): Payer: Self-pay | Admitting: Physician Assistant

## 2018-04-16 ENCOUNTER — Ambulatory Visit (INDEPENDENT_AMBULATORY_CARE_PROVIDER_SITE_OTHER): Payer: BC Managed Care – PPO | Admitting: Physician Assistant

## 2018-04-16 VITALS — BP 116/74 | HR 84 | Temp 97.5°F | Ht 68.0 in | Wt 321.0 lb

## 2018-04-16 DIAGNOSIS — Z1331 Encounter for screening for depression: Secondary | ICD-10-CM

## 2018-04-16 DIAGNOSIS — Z9189 Other specified personal risk factors, not elsewhere classified: Secondary | ICD-10-CM

## 2018-04-16 DIAGNOSIS — E038 Other specified hypothyroidism: Secondary | ICD-10-CM

## 2018-04-16 DIAGNOSIS — E7849 Other hyperlipidemia: Secondary | ICD-10-CM | POA: Diagnosis not present

## 2018-04-16 DIAGNOSIS — Z6841 Body Mass Index (BMI) 40.0 and over, adult: Secondary | ICD-10-CM

## 2018-04-16 DIAGNOSIS — E559 Vitamin D deficiency, unspecified: Secondary | ICD-10-CM

## 2018-04-16 MED ORDER — VITAMIN D (ERGOCALCIFEROL) 1.25 MG (50000 UNIT) PO CAPS: ORAL_CAPSULE | ORAL | 0 refills | Status: DC

## 2018-04-16 NOTE — Progress Notes (Signed)
Office: [phone]  /  Fax: [phone]   HPI:   Chief Complaint: OBESITY Melanie [name] is here to discuss her progress with her obesity treatment plan. She is on the Category 3 plan and is following her eating plan approximately 85% of the time. She states she is exercising 0 minutes 0 times per week. Melanie [name] did well with weight loss. She reports that she has been eating more protein. [name] wants to switch lunch and dinner portions to fit her schedule better.  Her weight is (!) 321 lb (145.6 kg) today and has had a weight loss of 8 pounds over a period of 2 weeks since her last visit. She has lost 17 lbs since starting treatment with [place].  Vitamin D deficiency Melanie [name] has a diagnosis of vitamin D deficiency. She is currently taking prescription Vit D and denies nausea, vomiting or muscle weakness.  Hyperlipidemia Melanie [name] has hyperlipidemia. She is going to see a lipid specialist soon. She has not started [employer]. She denies any chest pain.  Hypothyroid Melanie [name] has a diagnosis of hypothyroidism. She is on levothyroxine. She denies hot or cold intolerance.   At risk for cardiovascular disease Melanie [name] is at a higher than average risk for cardiovascular disease due to obesity. She currently denies any chest pain.   ALLERGIES: Allergies  Allergen Reactions  . Lidocaine Other (See Comments)    Flu like sx's  . Relafen [Nabumetone] Other (See Comments)    migraine  . Celebrex [Celecoxib] Rash  . Sulfa Antibiotics Rash    MEDICATIONS: Current Outpatient Medications on File Prior to Visit  Medication Sig Dispense Refill  . buPROPion (WELLBUTRIN SR) 150 MG 12 hr tablet Take 1 tablet (150 mg total) by mouth 2 (two) times daily. 60 tablet 2  . busPIRone (BUSPAR) 15 MG tablet TAKE ONE TABLET BY MOUTH TWICE A DAY OR AS DIRECTED. 180 tablet 3  . cetirizine (ZYRTEC) 10 MG tablet Take 10 mg by mouth daily.    . chlorthalidone (HYGROTON) 25 MG tablet Take 1 tablet (25 mg total) by mouth daily. 30 tablet 2  .  citalopram (CELEXA) 20 MG tablet TAKE ONE TABLET BY MOUTH DAILY 90 tablet 3  . colesevelam (WELCHOL) 625 MG tablet Take 1 tablet (625 mg total) by mouth 2 (two) times daily with a meal. 60 tablet 2  . diclofenac (VOLTAREN) 75 MG EC tablet Take 1 tablet (75 mg total) by mouth 2 (two) times daily. 50 tablet 0  . fluticasone (FLONASE) 50 MCG/ACT nasal spray Place 2 sprays into both nostrils daily. 48 g 3  . levothyroxine (SYNTHROID, LEVOTHROID) 75 MCG tablet Take 1 tablet (75 mcg total) by mouth daily. 30 tablet 0  . LORazepam (ATIVAN) 1 MG tablet Take 1 tablet (1 mg total) by mouth 2 (two) times daily as needed for anxiety. 20 tablet 0  . metFORMIN (GLUCOPHAGE) 500 MG tablet Take 1 tablet (500 mg total) by mouth daily with breakfast. 30 tablet 2  . montelukast (SINGULAIR) 10 MG tablet Take 1 tablet (10 mg total) by mouth at bedtime. 30 tablet 0  . Multiple Vitamin (MULTIVITAMIN) tablet Take 1 tablet by mouth daily.    . Omega-3 Fatty Acids (FISH OIL PO) Take by mouth daily.     Current Facility-Administered Medications on File Prior to Visit  Medication Dose Route Frequency Provider Last Rate Last Dose  . cefTRIAXone (ROCEPHIN) injection 1 g  1 g Intramuscular Once Rutherford Guys, MD        PAST MEDICAL HISTORY:  Past Medical History:  Diagnosis Date  . Allergy   . Anemia   . Anxiety   . Back pain   . Constipation   . Depression   . Fatty liver   . GERD (gastroesophageal reflux disease)   . Hyperlipidemia   . Infertility, female   . Joint pain   . Kidney problem   . Membranous nephrosis   . Multiple food allergies   . Muscle pain   . Numbness and tingling in both hands   . Pre-diabetes   . Shortness of breath   . Sleep apnea   . Stress   . Swelling of both lower extremities     PAST SURGICAL HISTORY: Past Surgical History:  Procedure Laterality Date  . FINGER SURGERY Left     index finger  . KNEE SURGERY Left   . LIVER BIOPSY N/A 05/15/2014   Procedure: LIVER BIOPSY;   Surgeon: Inda Castle, MD;  Location: [facility];  Service: Endoscopy;  Laterality: N/A;  ultrasound to mark the liver  . TONSILLECTOMY AND ADENOIDECTOMY  1978  . TYMPANOSTOMY TUBE PLACEMENT    . wrist cyst Left   . WRIST SURGERY     left - cyst removed    SOCIAL HISTORY: Social History   Tobacco Use  . Smoking status: Never Smoker  . Smokeless tobacco: Never Used  Substance Use Topics  . Alcohol use: No    Alcohol/week: 0.0 standard drinks  . Drug use: No    FAMILY HISTORY: Family History  Problem Relation Age of Onset  . Diabetes Mother   . Liver disease Mother        ESLD- unknown etiology  . Obesity Mother   . Diabetes Maternal Grandmother   . Diabetes Paternal Grandmother   . Breast cancer Paternal Grandmother   . Stroke Maternal Grandfather   . Hyperlipidemia Father     ROS: Review of Systems  Constitutional: Positive for weight loss.  Cardiovascular: Negative for chest pain.  Gastrointestinal: Negative for nausea and vomiting.  Musculoskeletal:       Negative muscle weakness  Endo/Heme/Allergies:       Negative heat/cold intolerance    PHYSICAL EXAM: Blood pressure 116/74, pulse 84, temperature (!) 97.5 F (36.4 C), temperature source Oral, height 5' 8"  (1.727 m), weight (!) 321 lb (145.6 kg), SpO2 96 %. Body mass index is 48.81 kg/m. Physical Exam Vitals signs reviewed.  Constitutional:      Appearance: Normal appearance. She is obese.  Cardiovascular:     Rate and Rhythm: Normal rate.     Pulses: Normal pulses.  Pulmonary:     Effort: Pulmonary effort is normal.     Breath sounds: Normal breath sounds.  Musculoskeletal: Normal range of motion.  Skin:    General: Skin is warm and dry.  Neurological:     Mental Status: She is alert and oriented to person, place, and time.  Psychiatric:        Mood and Affect: Mood normal.        Behavior: Behavior normal.     RECENT LABS AND TESTS: BMET    Component Value Date/Time   NA 140  04/06/2018 0840   K 4.1 04/06/2018 0840   CL 99 04/06/2018 0840   CO2 25 04/06/2018 0840   GLUCOSE 135 (H) 04/06/2018 0840   GLUCOSE 118 (H) 09/08/2015 1514   BUN 15 04/06/2018 0840   CREATININE 0.93 04/06/2018 0840   CREATININE 0.85 09/08/2015 1514   CALCIUM 10.3 (  H) 04/06/2018 0840   GFRNONAA 75 04/06/2018 0840   GFRNONAA 85 09/08/2015 1514   GFRAA 86 04/06/2018 0840   GFRAA >89 09/08/2015 1514   Lab Results  Component Value Date   HGBA1C 5.6 12/13/2017   HGBA1C 5.0 09/05/2017   HGBA1C 5.8 (H) 05/23/2017   HGBA1C 5.7 04/12/2017   HGBA1C 5.8 (H) 05/19/2015   Lab Results  Component Value Date   INSULIN 39.0 (H) 12/13/2017   INSULIN 24.3 09/05/2017   INSULIN 30.4 (H) 05/23/2017   CBC    Component Value Date/Time   WBC 7.2 05/23/2017 1249   WBC 8.3 08/27/2015 1633   RBC 4.66 05/23/2017 1249   RBC 4.27 08/27/2015 1633   HGB 14.7 05/23/2017 1249   HCT 43.3 05/23/2017 1249   PLT 213 04/05/2017 0947   MCV 93 05/23/2017 1249   MCH 31.5 05/23/2017 1249   MCH 31.1 08/27/2015 1633   MCHC 33.9 05/23/2017 1249   MCHC 34.3 08/27/2015 1633   RDW 13.6 05/23/2017 1249   LYMPHSABS 2.8 05/23/2017 1249   MONOABS 0.4 10/27/2013 1604   EOSABS 0.3 05/23/2017 1249   BASOSABS 0.0 05/23/2017 1249   Iron/TIBC/Ferritin/ %Sat    Component Value Date/Time   IRON 95 06/27/2013 1645   TIBC 371 06/27/2013 1645   FERRITIN 401 (H) 06/27/2013 1645   IRONPCTSAT 26 06/27/2013 1645   Lipid Panel     Component Value Date/Time   CHOL 396 (H) 04/06/2018 0840   TRIG 297 (H) 04/06/2018 0840   HDL 65 04/06/2018 0840   CHOLHDL 6.1 (H) 04/06/2018 0840   CHOLHDL 5.0 06/06/2013 1654   VLDL 27 06/06/2013 1654   LDLCALC 272 (H) 04/06/2018 0840   Hepatic Function Panel     Component Value Date/Time   PROT 5.8 (L) 04/06/2018 0840   ALBUMIN 3.6 (L) 04/06/2018 0840   AST 65 (H) 04/06/2018 0840   ALT 106 (H) 04/06/2018 0840   ALKPHOS 155 (H) 04/06/2018 0840   BILITOT 0.8 04/06/2018 0840    BILIDIR 0.2 04/07/2015 1349   IBILI 1.0 04/07/2015 1349      Component Value Date/Time   TSH 5.570 (H) 12/13/2017 1014   TSH 8.710 (H) 09/05/2017 0929   TSH 7.870 (H) 05/23/2017 1249   Results for KAMIL, HANIGAN (MRN [id number]) as of 04/16/2018 11:16  Ref. Range 12/13/2017 10:14  Vitamin D, 25-Hydroxy Latest Ref Range: 30.0 - 100.0 ng/mL 29.3 (L)    ASSESSMENT AND PLAN: Vitamin D deficiency - Plan: Vitamin D, Ergocalciferol, (DRISDOL) 1.25 MG (50000 UT) CAPS capsule  Other hyperlipidemia  Other specified hypothyroidism  Depression screening  At risk for heart disease  Class 3 severe obesity with serious comorbidity and body mass index (BMI) of 45.0 to 49.9 in adult, unspecified obesity type ([place])  PLAN:  Vitamin D Deficiency Antanisha was informed that low vitamin D levels contributes to fatigue and are associated with obesity, breast, and colon cancer. Moni agrees to continue taking prescription Vit D @50 ,000 IU twice weekly #10 with no refills. She follow-up for routine testing of Vitamin D, at least 2-3 times per year. She was informed of the risk of over-replacement of vitamin D and agrees to not increase her dose unless she discusses this with [place] first. She agrees to follow-up with our clinic in 2 weeks.   Hyperlipidemia Rosena was informed of the American Heart Association Guidelines emphasizing intensive lifestyle modifications as the first line treatment for hyperlipidemia. We discussed many lifestyle modifications today in depth, and Lattie Haw  will continue to work on decreasing saturated fats such as fatty red meat, butter and many fried foods. She will also increase vegetables and lean protein in her diet and continue to work on exercise and weight loss efforts. Khari will see a lipid specialist.  Hypothyroid Faylynn was informed of the importance of good thyroid control to help with weight loss efforts. She was also informed that supertheraputic thyroid levels are dangerous and will not  improve weight loss results. Nastasha was given a prescription for levothyroxine #30 with no refills. She agrees to follow-up with our clinic in 2 weeks.   Cardiovascular risk counseling Shuntay was given extended (15 minutes) coronary artery disease prevention counseling today. She is 43 y.o. female and has risk factors for heart disease including obesity. We discussed intensive lifestyle modifications today with an emphasis on specific weight loss instructions and strategies. Pt was also informed of the importance of increasing exercise and decreasing saturated fats to help prevent heart disease.  Obesity Aiana is currently in the action stage of change. As such, her goal is to continue with weight loss efforts She has agreed to follow the Category 3 plan Kallista has been instructed to work up to a goal of 150 minutes of combined cardio and strengthening exercise per week for weight loss and overall health benefits. We discussed the following Behavioral Modification Strategies today: increasing lean protein intake and work on meal planning and easy cooking plans.  Sameena has agreed to follow up with our clinic in 2 weeks. She was informed of the importance of frequent follow-up visits to maximize her success with intensive lifestyle modifications for her multiple health conditions.   OBESITY BEHAVIORAL INTERVENTION VISIT  Today's visit was #18.  Starting weight: 338 lbs Starting date: 05/23/2017 Today's weight : 321 lbs  Today's date: 04/16/2018 Total lbs lost to date: 17  ASK: We discussed the diagnosis of obesity with [name] today and Taiwana agreed to give [place] permission to discuss obesity behavioral modification therapy today.  ASSESS: Tyese has the diagnosis of obesity and her BMI today is 48.81 Nicholl is in the action stage of change   ADVISE: Ladawna was educated on the multiple health risks of obesity as well as the benefit of weight loss to improve her health. She was advised of the need  for long term treatment and the importance of lifestyle modifications.  AGREE: Multiple dietary modification options and treatment options were discussed and  Essynce agreed to the above obesity treatment plan.  Migdalia Dk, am acting as transcriptionist for Abby Potash, PA-C I, Abby Potash, PA-C have reviewed above note and agree with its content

## 2018-04-17 ENCOUNTER — Telehealth: Payer: BC Managed Care – PPO | Admitting: Nurse Practitioner

## 2018-04-17 DIAGNOSIS — R6889 Other general symptoms and signs: Secondary | ICD-10-CM

## 2018-04-17 NOTE — Progress Notes (Signed)
E visit for Flu like symptoms  5 minutes spent reviewing and documenting in chart.   We are sorry that you are not feeling well.  Here is how we plan to help! Based on what you have shared with me it looks like you may have a respiratory virus that may be influenza.  Influenza or "the flu" is   an infection caused by a respiratory virus. The flu virus is highly contagious and persons who did not receive their yearly flu vaccination may "catch" the flu from close contact.  We have anti-viral medications to treat the viruses that cause this infection. They are not a "cure" and only shorten the course of the infection. These prescriptions are most effective when they are given within the first 2 days of "flu" symptoms. Antiviral medication are indicated if you have a high risk of complications from the flu. You should  also consider an antiviral medication if you are in close contact with someone who is at risk. These medications can help patients avoid complications from the flu  but have side effects that you should know. Possible side effects from Tamiflu or oseltamivir include nausea, vomiting, diarrhea, dizziness, headaches, eye redness, sleep problems or other respiratory symptoms. You should not take Tamiflu if you have an allergy to oseltamivir or any to the ingredients in Tamiflu.  Based upon your symptoms and potential risk factors I recommend that you follow the flu symptoms recommendation that I have listed below. out of treatment window for tamiflu.   ANYONE WHO HAS FLU SYMPTOMS SHOULD: . Stay home. The flu is highly contagious and going out or to work exposes others! . Be sure to drink plenty of fluids. Water is fine as well as fruit juices, sodas and electrolyte beverages. You may want to stay away from caffeine or alcohol. If you are nauseated, try taking small sips of liquids. How do you know if you are getting enough fluid? Your urine should be a pale yellow or almost colorless. . Get  rest. . Taking a steamy shower or using a humidifier may help nasal congestion and ease sore throat pain. Using a saline nasal spray works much the same way. . Cough drops, hard candies and sore throat lozenges may ease your cough. . Line up a caregiver. Have someone check on you regularly.   GET HELP RIGHT AWAY IF: . You cannot keep down liquids or your medications. . You become short of breath . Your fell like you are going to pass out or loose consciousness. . Your symptoms persist after you have completed your treatment plan MAKE SURE YOU   Understand these instructions.  Will watch your condition.  Will get help right away if you are not doing well or get worse.  Your e-visit answers were reviewed by a board certified advanced clinical practitioner to complete your personal care plan.  Depending on the condition, your plan could have included both over the counter or prescription medications.  If there is a problem please reply  once you have received a response from your provider.  Your safety is important to [place].  If you have drug allergies check your prescription carefully.    You can use MyChart to ask questions about today's visit, request a non-urgent call back, or ask for a work or school excuse for 24 hours related to this e-Visit. If it has been greater than 24 hours you will need to follow up with your provider, or enter a new e-Visit to  address those concerns.  You will get an e-mail in the next two days asking about your experience.  I hope that your e-visit has been valuable and will speed your recovery. Thank you for using e-visits.

## 2018-05-01 ENCOUNTER — Ambulatory Visit (INDEPENDENT_AMBULATORY_CARE_PROVIDER_SITE_OTHER): Payer: BC Managed Care – PPO | Admitting: Physician Assistant

## 2018-05-01 ENCOUNTER — Encounter (INDEPENDENT_AMBULATORY_CARE_PROVIDER_SITE_OTHER): Payer: Self-pay | Admitting: Physician Assistant

## 2018-05-01 ENCOUNTER — Telehealth: Payer: Self-pay | Admitting: Family Medicine

## 2018-05-01 VITALS — BP 112/75 | HR 68 | Temp 97.8°F | Ht 68.0 in | Wt 327.0 lb

## 2018-05-01 DIAGNOSIS — R7303 Prediabetes: Secondary | ICD-10-CM

## 2018-05-01 DIAGNOSIS — Z6841 Body Mass Index (BMI) 40.0 and over, adult: Secondary | ICD-10-CM

## 2018-05-01 DIAGNOSIS — E66813 Obesity, class 3: Secondary | ICD-10-CM

## 2018-05-01 DIAGNOSIS — E559 Vitamin D deficiency, unspecified: Secondary | ICD-10-CM

## 2018-05-01 DIAGNOSIS — E038 Other specified hypothyroidism: Secondary | ICD-10-CM

## 2018-05-01 DIAGNOSIS — Z9189 Other specified personal risk factors, not elsewhere classified: Secondary | ICD-10-CM

## 2018-05-01 MED ORDER — LEVOTHYROXINE SODIUM 75 MCG PO TABS: 75.0000 ug | ORAL_TABLET | Freq: Every day | ORAL | 2 refills | Status: DC

## 2018-05-01 NOTE — Progress Notes (Signed)
Office: [phone]  /  Fax: [phone]   HPI:   Chief Complaint: OBESITY Melanie [name] is here to discuss her progress with her obesity treatment plan. She is on the Category 3 plan and is following her eating plan approximately 70% of the time. She states she is exercising 0 minutes 0 times per week. [name] reports that she was sick over the last 2 weeks and was unable to get all of the food in on the plan. She is ready to get back on track. Her weight is (!) 327 lb (148.3 kg) today and has had a weight gain of 6 lbs since her last visit. She has lost 11 lbs since starting treatment with [place].  Hypothyroid Melanie [name] has a diagnosis of hypothyroidism. She is on levothyroxine. She denies hot or cold intolerance and no excessive fatigue.  Vitamin D deficiency Melanie [name] has a diagnosis of Vitamin D deficiency. She is currently taking Vit D and denies nausea, vomiting or muscle weakness.  At risk for osteopenia and osteoporosis Melanie [name] is at higher risk of osteopenia and osteoporosis due to vitamin D deficiency.   Pre-Diabetes Melanie [name] has a diagnosis of prediabetes based on her elevated Hgb A1c and was informed this puts her at greater risk of developing diabetes. She is taking metformin currently and continues to work on diet and exercise to decrease risk of diabetes. She denies nausea, vomiting, diarrhea, or polyphagia.   ASSESSMENT AND PLAN:  Other specified hypothyroidism - Plan: T3, T4, free, TSH, levothyroxine (SYNTHROID, LEVOTHROID) 75 MCG tablet  Vitamin D deficiency - Plan: VITAMIN D 25 Hydroxy (Vit-D Deficiency, Fractures)  Prediabetes - Plan: Hemoglobin A1c, Insulin, random  At risk for osteoporosis  Class 3 severe obesity with serious comorbidity and body mass index (BMI) of 45.0 to 49.9 in adult, unspecified obesity type ([place])  PLAN:  Hypothyroid Melanie [name] was informed of the importance of good thyroid control to help with weight loss efforts. She was also informed that supertheraputic thyroid  levels are dangerous and will not improve weight loss results. Zari was given a refill on her levothyroxine #30 with 2 refills. We will check labs today.  Vitamin D Deficiency Melanie [name] was informed that low Vitamin D levels contributes to fatigue and are associated with obesity, breast, and colon cancer. She agrees to continue Vit D and will have labs drawn today. She was informed of the risk of over-replacement of Vitamin D and agrees to not increase her dose unless she discusses this with [place] first. Melanie [name] agrees to follow-up with our clinic in 2-3 weeks.  At risk for osteopenia and osteoporosis Melanie [name] was given extended  (15 minutes) osteoporosis prevention counseling today. Melanie [name] is at risk for osteopenia and osteoporsis due to her Vitamin D deficiency. She was encouraged to take her Vitamin D and follow her higher calcium diet and increase strengthening exercise to help strengthen her bones and decrease her risk of osteopenia and osteoporosis.  Pre-Diabetes Melanie [name] will continue to work on weight loss, exercise, and decreasing simple carbohydrates in her diet to help decrease the risk of diabetes. We dicussed metformin including benefits and risks. She was informed that eating too many simple carbohydrates or too many calories at one sitting increases the likelihood of GI side effects. Melanie [name] will continue metformin for now and weight loss. We will check labs today. Melanie [name] agreed to follow-up with [place] as directed to monitor her progress.  Obesity Melanie [name] is currently in the action stage of change. As such, her goal is to continue with  weight loss efforts. She has agreed to follow the Category 3 plan. Melanie [name] has been instructed to work up to a goal of 150 minutes of combined cardio and strengthening exercise per week for weight loss and overall health benefits. We discussed the following Behavioral Modification Strategies today: increasing lean protein intake and work on meal planning and easy cooking plans.  Melanie [name]  has agreed to follow-up with our clinic in 2-3 weeks. She was informed of the importance of frequent follow up visits to maximize her success with intensive lifestyle modifications for her multiple health conditions.  ALLERGIES: Allergies  Allergen Reactions  . Lidocaine Other (See Comments)    Flu like sx's  . Relafen [Nabumetone] Other (See Comments)    migraine  . Celebrex [Celecoxib] Rash  . Sulfa Antibiotics Rash    MEDICATIONS: Current Outpatient Medications on File Prior to Visit  Medication Sig Dispense Refill  . buPROPion (WELLBUTRIN SR) 150 MG 12 hr tablet Take 1 tablet (150 mg total) by mouth 2 (two) times daily. 60 tablet 2  . busPIRone (BUSPAR) 15 MG tablet TAKE ONE TABLET BY MOUTH TWICE A DAY OR AS DIRECTED. 180 tablet 3  . cetirizine (ZYRTEC) 10 MG tablet Take 10 mg by mouth daily.    . chlorthalidone (HYGROTON) 25 MG tablet Take 1 tablet (25 mg total) by mouth daily. 30 tablet 2  . citalopram (CELEXA) 20 MG tablet TAKE ONE TABLET BY MOUTH DAILY 90 tablet 3  . colesevelam (WELCHOL) 625 MG tablet Take 1 tablet (625 mg total) by mouth 2 (two) times daily with a meal. 60 tablet 2  . diclofenac (VOLTAREN) 75 MG EC tablet Take 1 tablet (75 mg total) by mouth 2 (two) times daily. 50 tablet 0  . fluticasone (FLONASE) 50 MCG/ACT nasal spray Place 2 sprays into both nostrils daily. 48 g 3  . LORazepam (ATIVAN) 1 MG tablet Take 1 tablet (1 mg total) by mouth 2 (two) times daily as needed for anxiety. 20 tablet 0  . metFORMIN (GLUCOPHAGE) 500 MG tablet Take 1 tablet (500 mg total) by mouth daily with breakfast. 30 tablet 2  . montelukast (SINGULAIR) 10 MG tablet Take 1 tablet (10 mg total) by mouth at bedtime. 30 tablet 0  . Multiple Vitamin (MULTIVITAMIN) tablet Take 1 tablet by mouth daily.    . Omega-3 Fatty Acids (FISH OIL PO) Take by mouth daily.    . Vitamin D, Ergocalciferol, (DRISDOL) 1.25 MG (50000 UT) CAPS capsule TAKE ONE CAPSULE BY MOUTH TWICE WEEKLY 10 capsule 0    Current Facility-Administered Medications on File Prior to Visit  Medication Dose Route Frequency Provider Last Rate Last Dose  . cefTRIAXone (ROCEPHIN) injection 1 g  1 g Intramuscular Once Rutherford Guys, MD        PAST MEDICAL HISTORY: Past Medical History:  Diagnosis Date  . Allergy   . Anemia   . Anxiety   . Back pain   . Constipation   . Depression   . Fatty liver   . GERD (gastroesophageal reflux disease)   . Hyperlipidemia   . Infertility, female   . Joint pain   . Kidney problem   . Membranous nephrosis   . Multiple food allergies   . Muscle pain   . Numbness and tingling in both hands   . Pre-diabetes   . Shortness of breath   . Sleep apnea   . Stress   . Swelling of both lower extremities     PAST SURGICAL HISTORY:  Past Surgical History:  Procedure Laterality Date  . FINGER SURGERY Left     index finger  . KNEE SURGERY Left   . LIVER BIOPSY N/A 05/15/2014   Procedure: LIVER BIOPSY;  Surgeon: Inda Castle, MD;  Location: [facility];  Service: Endoscopy;  Laterality: N/A;  ultrasound to mark the liver  . TONSILLECTOMY AND ADENOIDECTOMY  1978  . TYMPANOSTOMY TUBE PLACEMENT    . wrist cyst Left   . WRIST SURGERY     left - cyst removed    SOCIAL HISTORY: Social History   Tobacco Use  . Smoking status: Never Smoker  . Smokeless tobacco: Never Used  Substance Use Topics  . Alcohol use: No    Alcohol/week: 0.0 standard drinks  . Drug use: No    FAMILY HISTORY: Family History  Problem Relation Age of Onset  . Diabetes Mother   . Liver disease Mother        ESLD- unknown etiology  . Obesity Mother   . Diabetes Maternal Grandmother   . Diabetes Paternal Grandmother   . Breast cancer Paternal Grandmother   . Stroke Maternal Grandfather   . Hyperlipidemia Father    ROS: Review of Systems  Constitutional: Negative for malaise/fatigue and weight loss.       Negative for hot or cold intolerance.  Gastrointestinal: Negative for diarrhea,  nausea and vomiting.  Musculoskeletal:       Negative for muscle weakness.  Endo/Heme/Allergies:       Negative for polyphagia. Negative for hypoglycemia.   PHYSICAL EXAM: Blood pressure 112/75, pulse 68, temperature 97.8 F (36.6 C), height 5' 8"  (3.300 m), weight (!) 327 lb (148.3 kg), SpO2 92 %. Body mass index is 49.72 kg/m. Physical Exam Vitals signs reviewed.  Constitutional:      Appearance: Normal appearance. She is obese.  Cardiovascular:     Rate and Rhythm: Normal rate.     Pulses: Normal pulses.  Pulmonary:     Effort: Pulmonary effort is normal.     Breath sounds: Normal breath sounds.  Musculoskeletal: Normal range of motion.  Skin:    General: Skin is warm and dry.  Neurological:     Mental Status: She is alert and oriented to person, place, and time.  Psychiatric:        Behavior: Behavior normal.   RECENT LABS AND TESTS: BMET    Component Value Date/Time   NA 140 04/06/2018 0840   K 4.1 04/06/2018 0840   CL 99 04/06/2018 0840   CO2 25 04/06/2018 0840   GLUCOSE 135 (H) 04/06/2018 0840   GLUCOSE 118 (H) 09/08/2015 1514   BUN 15 04/06/2018 0840   CREATININE 0.93 04/06/2018 0840   CREATININE 0.85 09/08/2015 1514   CALCIUM 10.3 (H) 04/06/2018 0840   GFRNONAA 75 04/06/2018 0840   GFRNONAA 85 09/08/2015 1514   GFRAA 86 04/06/2018 0840   GFRAA >89 09/08/2015 1514   Lab Results  Component Value Date   HGBA1C 5.6 12/13/2017   HGBA1C 5.0 09/05/2017   HGBA1C 5.8 (H) 05/23/2017   HGBA1C 5.7 04/12/2017   HGBA1C 5.8 (H) 05/19/2015   Lab Results  Component Value Date   INSULIN 39.0 (H) 12/13/2017   INSULIN 24.3 09/05/2017   INSULIN 30.4 (H) 05/23/2017   CBC    Component Value Date/Time   WBC 7.2 05/23/2017 1249   WBC 8.3 08/27/2015 1633   RBC 4.66 05/23/2017 1249   RBC 4.27 08/27/2015 1633   HGB 14.7 05/23/2017 1249  HCT 43.3 05/23/2017 1249   PLT 213 04/05/2017 0947   MCV 93 05/23/2017 1249   MCH 31.5 05/23/2017 1249   MCH 31.1 08/27/2015  1633   MCHC 33.9 05/23/2017 1249   MCHC 34.3 08/27/2015 1633   RDW 13.6 05/23/2017 1249   LYMPHSABS 2.8 05/23/2017 1249   MONOABS 0.4 10/27/2013 1604   EOSABS 0.3 05/23/2017 1249   BASOSABS 0.0 05/23/2017 1249   Iron/TIBC/Ferritin/ %Sat    Component Value Date/Time   IRON 95 06/27/2013 1645   TIBC 371 06/27/2013 1645   FERRITIN 401 (H) 06/27/2013 1645   IRONPCTSAT 26 06/27/2013 1645   Lipid Panel     Component Value Date/Time   CHOL 396 (H) 04/06/2018 0840   TRIG 297 (H) 04/06/2018 0840   HDL 65 04/06/2018 0840   CHOLHDL 6.1 (H) 04/06/2018 0840   CHOLHDL 5.0 06/06/2013 1654   VLDL 27 06/06/2013 1654   LDLCALC 272 (H) 04/06/2018 0840   Hepatic Function Panel     Component Value Date/Time   PROT 5.8 (L) 04/06/2018 0840   ALBUMIN 3.6 (L) 04/06/2018 0840   AST 65 (H) 04/06/2018 0840   ALT 106 (H) 04/06/2018 0840   ALKPHOS 155 (H) 04/06/2018 0840   BILITOT 0.8 04/06/2018 0840   BILIDIR 0.2 04/07/2015 1349   IBILI 1.0 04/07/2015 1349      Component Value Date/Time   TSH 5.570 (H) 12/13/2017 1014   TSH 8.710 (H) 09/05/2017 0929   TSH 7.870 (H) 05/23/2017 1249    Ref. Range 12/13/2017 10:14  Vitamin D, 25-Hydroxy Latest Ref Range: 30.0 - 100.0 ng/mL 29.3 (L)   OBESITY BEHAVIORAL INTERVENTION VISIT  Today's visit was #19  Starting weight: 338 lbs Starting date: 05/23/2017 Today's weight: 327 lbs Today's date: 05/01/2018 Total lbs lost to date: 11 At least 15 minutes were spent on discussing the following behavioral intervention visit.  ASK: We discussed the diagnosis of obesity with [name] today and Reka agreed to give [place] permission to discuss obesity behavioral modification therapy today.  ASSESS: Aleisha has the diagnosis of obesity and her BMI today is 49.72. Elliemae is in the action stage of change.   ADVISE: Mindel was educated on the multiple health risks of obesity as well as the benefit of weight loss to improve her health. She was advised of the need  for long term treatment and the importance of lifestyle modifications to improve her current health and to decrease her risk of future health problems.  AGREE: Multiple dietary modification options and treatment options were discussed and  Maranda agreed to follow the recommendations documented in the above note.  ARRANGE: Shantoria was educated on the importance of frequent visits to treat obesity as outlined per CMS and USPSTF guidelines and agreed to schedule her next follow up appointment today.  Migdalia Dk, am acting as transcriptionist for Abby Potash, PA-C I, Abby Potash, PA-C have reviewed above note and agree with its content

## 2018-05-01 NOTE — Telephone Encounter (Signed)
Called to regarding their appt with Dr. Nolon Rod on 4/3. I was able to reschedule them for 4/10 with Dr. Nolon Rod. I advised of time and late policy. Pt acknowledged.

## 2018-05-02 LAB — VITAMIN D 25 HYDROXY (VIT D DEFICIENCY, FRACTURES): Vit D, 25-Hydroxy: 36 ng/mL (ref 30.0–100.0)

## 2018-05-02 LAB — HEMOGLOBIN A1C
ESTIMATED AVERAGE GLUCOSE: 111 mg/dL
Hgb A1c MFr Bld: 5.5 % (ref 4.8–5.6)

## 2018-05-02 LAB — T3: T3, Total: 113 ng/dL (ref 71–180)

## 2018-05-02 LAB — INSULIN, RANDOM: INSULIN: 30.4 u[IU]/mL — ABNORMAL HIGH (ref 2.6–24.9)

## 2018-05-02 LAB — TSH: TSH: 5.01 u[IU]/mL — ABNORMAL HIGH (ref 0.450–4.500)

## 2018-05-02 LAB — T4, FREE: Free T4: 1.21 ng/dL (ref 0.82–1.77)

## 2018-05-07 ENCOUNTER — Other Ambulatory Visit: Payer: Self-pay | Admitting: Orthopaedic Surgery

## 2018-05-07 DIAGNOSIS — M25521 Pain in right elbow: Secondary | ICD-10-CM

## 2018-05-18 ENCOUNTER — Ambulatory Visit
Admission: RE | Admit: 2018-05-18 | Discharge: 2018-05-18 | Disposition: A | Discharge status: Home / Self Care | Payer: BC Managed Care – PPO | Source: Ambulatory Visit | Attending: Orthopaedic Surgery | Admitting: Orthopaedic Surgery

## 2018-05-18 DIAGNOSIS — M25521 Pain in right elbow: Secondary | ICD-10-CM

## 2018-05-22 ENCOUNTER — Encounter: Payer: Self-pay | Admitting: Family Medicine

## 2018-05-22 ENCOUNTER — Ambulatory Visit (INDEPENDENT_AMBULATORY_CARE_PROVIDER_SITE_OTHER): Payer: BC Managed Care – PPO | Admitting: Physician Assistant

## 2018-05-24 NOTE — Progress Notes (Signed)
05-01-18 (Epic) HGA1C  05-25-17 (Epic) ECHO

## 2018-05-24 NOTE — Patient Instructions (Signed)
Melanie [name]  05/24/2018   Your procedure is scheduled on: 06-05-18    Report to [facility] Main  Entrance    Report to Admitting at 8:19 AM    Call this number if you have problems the morning of surgery [phone]    Remember: Do not eat food or drink liquids :After Midnight.    BRUSH YOUR TEETH MORNING OF SURGERY AND RINSE YOUR MOUTH OUT, NO CHEWING GUM CANDY OR MINTS.     Take these medicines the morning of surgery with A SIP OF WATER: Bupropion (Wellbutrin), Buspirone (Buspar), Cetirizine (Zyrtec), Celexa (Citalopram), Lorazepam (Ativan). You may use and bring your nasal spray   DO NOT TAKE ANY DIABETIC MEDICATIONS DAY OF YOUR SURGERY                               You may not have any metal on your body including hair pins and              piercings  Do not wear jewelry, make-up, lotions, powders or perfumes, deodorant             Do not wear nail polish.  Do not shave  48 hours prior to surgery.     Do not bring valuables to the hospital. [place].  Contacts, dentures or bridgework may not be worn into surgery.       Patients discharged the day of surgery will not be allowed to drive home. IF YOU ARE HAVING SURGERY AND GOING HOME THE SAME DAY, YOU MUST HAVE AN ADULT TO DRIVE YOU HOME AND BE WITH YOU FOR 24 HOURS. YOU MAY GO HOME BY TAXI OR UBER OR ORTHERWISE, BUT AN ADULT MUST ACCOMPANY YOU HOME AND STAY WITH YOU FOR 24 HOURS.  Name and phone number of your driver:  Special Instructions: N/A              Please read over the following fact sheets you were given: _____________________________________________________________________             How to Manage Your Diabetes Before and After Surgery  Why is it important to control my blood sugar before and after surgery? . Improving blood sugar levels before and after surgery helps healing and can limit problems. . A way of improving blood  sugar control is eating a healthy diet by: o  Eating less sugar and carbohydrates o  Increasing activity/exercise o  Talking with your doctor about reaching your blood sugar goals . High blood sugars (greater than 180 mg/dL) can raise your risk of infections and slow your recovery, so you will need to focus on controlling your diabetes during the weeks before surgery. . Make sure that the doctor who takes care of your diabetes knows about your planned surgery including the date and location.  How do I manage my blood sugar before surgery? . Check your blood sugar at least 4 times a day, starting 2 days before surgery, to make sure that the level is not too high or low. o Check your blood sugar the morning of your surgery when you wake up and every 2 hours until you get to the Short Stay unit. . If your blood sugar is less than  70 mg/dL, you will need to treat for low blood sugar: o Do not take insulin. o Treat a low blood sugar (less than 70 mg/dL) with  cup of clear juice (cranberry or apple), 4 glucose tablets, OR glucose gel. o Recheck blood sugar in 15 minutes after treatment (to make sure it is greater than 70 mg/dL). If your blood sugar is not greater than 70 mg/dL on recheck, call [phone] for further instructions. . Report your blood sugar to the short stay nurse when you get to Short Stay.  . If you are admitted to the hospital after surgery: o Your blood sugar will be checked by the staff and you will probably be given insulin after surgery (instead of oral diabetes medicines) to make sure you have good blood sugar levels. o The goal for blood sugar control after surgery is 80-180 mg/dL.   WHAT DO I DO ABOUT MY DIABETES MEDICATION?  Marland Kitchen Do not take oral diabetes medicines (pills) the morning of surgery.  . THE DAY BEFORE SURGERY, take your usual dose of Metformin           - Preparing for Surgery Before surgery, you can play an important role.  Because skin is  not sterile, your skin needs to be as free of germs as possible.  You can reduce the number of germs on your skin by washing with CHG (chlorahexidine gluconate) soap before surgery.  CHG is an antiseptic cleaner which kills germs and bonds with the skin to continue killing germs even after washing. Please DO NOT use if you have an allergy to CHG or antibacterial soaps.  If your skin becomes reddened/irritated stop using the CHG and inform your nurse when you arrive at Short Stay. Do not shave (including legs and underarms) for at least 48 hours prior to the first CHG shower.  You may shave your face/neck. Please follow these instructions carefully:  1.  Shower with CHG Soap the night before surgery and the  morning of Surgery.  2.  If you choose to wash your hair, wash your hair first as usual with your  normal  shampoo.  3.  After you shampoo, rinse your hair and body thoroughly to remove the  shampoo.                           4.  Use CHG as you would any other liquid soap.  You can apply chg directly  to the skin and wash                       Gently with a scrungie or clean washcloth.  5.  Apply the CHG Soap to your body ONLY FROM THE NECK DOWN.   Do not use on face/ open                           Wound or open sores. Avoid contact with eyes, ears mouth and genitals (private parts).                       Wash face,  Genitals (private parts) with your normal soap.             6.  Wash thoroughly, paying special attention to the area where your surgery  will be performed.  7.  Thoroughly rinse your body with warm water from the neck down.  8.  DO NOT shower/wash with your normal soap after using and rinsing off  the CHG Soap.                9.  Pat yourself dry with a clean towel.            10.  Wear clean pajamas.            11.  Place clean sheets on your bed the night of your first shower and do not  sleep with pets. Day of Surgery : Do not apply any lotions/deodorants the morning of surgery.   Please wear clean clothes to the hospital/surgery center.  FAILURE TO FOLLOW THESE INSTRUCTIONS MAY RESULT IN THE CANCELLATION OF YOUR SURGERY PATIENT SIGNATURE_________________________________  NURSE SIGNATURE__________________________________  ________________________________________________________________________

## 2018-05-27 ENCOUNTER — Inpatient Hospital Stay (HOSPITAL_COMMUNITY)
Admission: RE | Admit: 2018-05-27 | Discharge: 2018-05-27 | Disposition: A | Discharge status: Home / Self Care | Payer: BC Managed Care – PPO | Source: Ambulatory Visit

## 2018-06-02 ENCOUNTER — Other Ambulatory Visit (INDEPENDENT_AMBULATORY_CARE_PROVIDER_SITE_OTHER): Payer: Self-pay | Admitting: Physician Assistant

## 2018-06-02 DIAGNOSIS — I10 Essential (primary) hypertension: Secondary | ICD-10-CM

## 2018-06-03 ENCOUNTER — Other Ambulatory Visit (HOSPITAL_COMMUNITY): Payer: BC Managed Care – PPO

## 2018-06-05 ENCOUNTER — Other Ambulatory Visit: Payer: Self-pay | Admitting: Family Medicine

## 2018-06-05 ENCOUNTER — Ambulatory Visit (HOSPITAL_COMMUNITY)
Admission: RE | Admit: 2018-06-05 | Payer: BC Managed Care – PPO | Source: Home / Self Care | Admitting: Orthopaedic Surgery

## 2018-06-05 ENCOUNTER — Ambulatory Visit (INDEPENDENT_AMBULATORY_CARE_PROVIDER_SITE_OTHER): Payer: BC Managed Care – PPO | Admitting: Physician Assistant

## 2018-06-05 ENCOUNTER — Encounter (HOSPITAL_COMMUNITY): Admission: RE | Payer: Self-pay | Source: Home / Self Care

## 2018-06-05 DIAGNOSIS — T7840XS Allergy, unspecified, sequela: Secondary | ICD-10-CM

## 2018-06-05 SURGERY — ULNAR NERVE DECOMPRESSION/TRANSPOSITION
Anesthesia: Choice | Laterality: Right

## 2018-06-05 NOTE — Telephone Encounter (Signed)
Please advise on refill request. Last filled by Ann Held

## 2018-06-07 NOTE — Telephone Encounter (Signed)
Please advise on refills. It looks like the pt she has 2 PCP offices.

## 2018-06-10 ENCOUNTER — Encounter (INDEPENDENT_AMBULATORY_CARE_PROVIDER_SITE_OTHER): Payer: Self-pay

## 2018-06-11 ENCOUNTER — Encounter (INDEPENDENT_AMBULATORY_CARE_PROVIDER_SITE_OTHER): Payer: Self-pay

## 2018-06-11 MED ORDER — MONTELUKAST SODIUM 10 MG PO TABS: 10.0000 mg | ORAL_TABLET | Freq: Every day | ORAL | 11 refills | Status: DC

## 2018-06-13 ENCOUNTER — Ambulatory Visit (INDEPENDENT_AMBULATORY_CARE_PROVIDER_SITE_OTHER): Payer: BC Managed Care – PPO | Admitting: Family Medicine

## 2018-06-14 ENCOUNTER — Other Ambulatory Visit: Payer: Self-pay | Admitting: *Deleted

## 2018-06-14 DIAGNOSIS — E785 Hyperlipidemia, unspecified: Secondary | ICD-10-CM

## 2018-06-15 ENCOUNTER — Ambulatory Visit: Payer: BC Managed Care – PPO | Admitting: Family Medicine

## 2018-06-17 ENCOUNTER — Ambulatory Visit (INDEPENDENT_AMBULATORY_CARE_PROVIDER_SITE_OTHER): Payer: BC Managed Care – PPO | Admitting: Physician Assistant

## 2018-06-17 ENCOUNTER — Encounter (INDEPENDENT_AMBULATORY_CARE_PROVIDER_SITE_OTHER): Payer: Self-pay | Admitting: Physician Assistant

## 2018-06-17 ENCOUNTER — Other Ambulatory Visit: Payer: Self-pay

## 2018-06-17 DIAGNOSIS — Z6841 Body Mass Index (BMI) 40.0 and over, adult: Secondary | ICD-10-CM | POA: Diagnosis not present

## 2018-06-17 DIAGNOSIS — E559 Vitamin D deficiency, unspecified: Secondary | ICD-10-CM | POA: Diagnosis not present

## 2018-06-17 NOTE — Progress Notes (Signed)
Office: [phone]  /  Fax: [phone] TeleHealth Visit:  Melanie [name] has verbally consented to this TeleHealth visit today. The patient is located at home, the provider is located at the [facility] office. The participants in this visit include the listed provider and patient. The visit was conducted today via Webex.  HPI:   Chief Complaint: OBESITY Melanie [name] is here to discuss her progress with her obesity treatment plan. She is on the Category 3 plan and is following her eating plan approximately 30% of the time. She states she is exercising 0 minutes 0 times per week. Melanie [name] reports that she has been off track due to lack of food choices at the grocery store and increased stress at home. We were unable to weigh the patient today for this TeleHealth visit. She feels as if she has maintained her weight since her last visit. She has lost 11 lbs since starting treatment with [place].  Vitamin D deficiency Melanie [name] has a diagnosis of Vitamin D deficiency. She is currently taking Vit D and denies nausea, vomiting or muscle weakness.  ASSESSMENT AND PLAN:  Vitamin D deficiency  Class 3 severe obesity with serious comorbidity and body mass index (BMI) of 45.0 to 49.9 in adult, unspecified obesity type ([place])  PLAN:  Vitamin D Deficiency Melanie [name] was informed that low Vitamin D levels contributes to fatigue and are associated with obesity, breast, and colon cancer. She agrees to continue taking Vit D and will follow-up for routine testing of Vitamin D, at least 2-3 times per year. She was informed of the risk of over-replacement of Vitamin D and agrees to not increase her dose unless she discusses this with [place] first. Melanie [name] agrees to follow-up with our clinic in 2 weeks.  Obesity Melanie [name] is currently in the action stage of change. As such, her goal is to continue with weight loss efforts She has agreed to change to keeping a food journal with 1450-1550 calories and 95 grams of protein daily.  Melanie [name] has been instructed to work up to a goal of 150 minutes of combined cardio and strengthening exercise per week for weight loss and overall health benefits. We discussed the following Behavioral Modification Strategies today: work on meal planning, easy cooking plans, and better snacking choices.  Melanie [name] has agreed to follow-up with our clinic in 2 weeks. She was informed of the importance of frequent follow-up visits to maximize her success with intensive lifestyle modifications for her multiple health conditions.  ALLERGIES: Allergies  Allergen Reactions  . Lidocaine Other (See Comments)    Flu like sx's  . Relafen [Nabumetone] Other (See Comments)    migraine  . Celebrex [Celecoxib] Rash  . Sulfa Antibiotics Rash    MEDICATIONS: Current Outpatient Medications on File Prior to Visit  Medication Sig Dispense Refill  . buPROPion (WELLBUTRIN SR) 150 MG 12 hr tablet Take 1 tablet (150 mg total) by mouth 2 (two) times daily. 60 tablet 2  . busPIRone (BUSPAR) 15 MG tablet TAKE ONE TABLET BY MOUTH TWICE A DAY OR AS DIRECTED. (Patient taking differently: Take 15 mg by mouth 2 (two) times daily. TAKE ONE TABLET BY MOUTH TWICE A DAY OR AS DIRECTED.) 180 tablet 3  . cetirizine (ZYRTEC) 10 MG tablet Take 10 mg by mouth daily.    . chlorthalidone (HYGROTON) 25 MG tablet Take 1 tablet (25 mg total) by mouth daily. 30 tablet 2  . citalopram (CELEXA) 20 MG tablet TAKE ONE TABLET BY MOUTH DAILY (Patient taking  differently: Take 20 mg by mouth daily. TAKE ONE TABLET BY MOUTH DAILY) 90 tablet 3  . colesevelam (WELCHOL) 625 MG tablet Take 1 tablet (625 mg total) by mouth 2 (two) times daily with a meal. (Patient taking differently: Take 625 mg by mouth daily with breakfast. ) 60 tablet 2  . diclofenac (VOLTAREN) 75 MG EC tablet Take 1 tablet (75 mg total) by mouth 2 (two) times daily. (Patient not taking: Reported on 05/21/2018) 50 tablet 0  . fluticasone (FLONASE) 50 MCG/ACT nasal spray Place 2 sprays  into both nostrils daily. (Patient taking differently: Place 2 sprays into both nostrils daily as needed for allergies. ) 48 g 3  . levothyroxine (SYNTHROID, LEVOTHROID) 75 MCG tablet Take 1 tablet (75 mcg total) by mouth daily. 30 tablet 2  . LORazepam (ATIVAN) 1 MG tablet Take 1 tablet (1 mg total) by mouth 2 (two) times daily as needed for anxiety. 20 tablet 0  . metFORMIN (GLUCOPHAGE) 500 MG tablet Take 1 tablet (500 mg total) by mouth daily with breakfast. 30 tablet 2  . montelukast (SINGULAIR) 10 MG tablet Take 1 tablet (10 mg total) by mouth at bedtime. 30 tablet 11  . Multiple Vitamin (MULTIVITAMIN) tablet Take 1 tablet by mouth daily.    . Omega-3 Fatty Acids (FISH OIL PO) Take 1 capsule by mouth 3 (three) times daily.     . Vitamin D, Ergocalciferol, (DRISDOL) 1.25 MG (50000 UT) CAPS capsule TAKE ONE CAPSULE BY MOUTH TWICE WEEKLY (Patient taking differently: Take 50,000 Units by mouth 2 (two) times a week. ) 10 capsule 0   Current Facility-Administered Medications on File Prior to Visit  Medication Dose Route Frequency Provider Last Rate Last Dose  . cefTRIAXone (ROCEPHIN) injection 1 g  1 g Intramuscular Once Rutherford Guys, MD        PAST MEDICAL HISTORY: Past Medical History:  Diagnosis Date  . Allergy   . Anemia   . Anxiety   . Back pain   . Constipation   . Depression   . Fatty liver   . GERD (gastroesophageal reflux disease)   . Hyperlipidemia   . Infertility, female   . Joint pain   . Kidney problem   . Membranous nephrosis   . Multiple food allergies   . Muscle pain   . Numbness and tingling in both hands   . Pre-diabetes   . Shortness of breath   . Sleep apnea   . Stress   . Swelling of both lower extremities     PAST SURGICAL HISTORY: Past Surgical History:  Procedure Laterality Date  . FINGER SURGERY Left     index finger  . KNEE SURGERY Left   . LIVER BIOPSY N/A 05/15/2014   Procedure: LIVER BIOPSY;  Surgeon: Inda Castle, MD;  Location: [facility]  ENDOSCOPY;  Service: Endoscopy;  Laterality: N/A;  ultrasound to mark the liver  . TONSILLECTOMY AND ADENOIDECTOMY  1978  . TYMPANOSTOMY TUBE PLACEMENT    . wrist cyst Left   . WRIST SURGERY     left - cyst removed    SOCIAL HISTORY: Social History   Tobacco Use  . Smoking status: Never Smoker  . Smokeless tobacco: Never Used  Substance Use Topics  . Alcohol use: No    Alcohol/week: 0.0 standard drinks  . Drug use: No    FAMILY HISTORY: Family History  Problem Relation Age of Onset  . Diabetes Mother   . Liver disease Mother  ESLD- unknown etiology  . Obesity Mother   . Diabetes Maternal Grandmother   . Diabetes Paternal Grandmother   . Breast cancer Paternal Grandmother   . Stroke Maternal Grandfather   . Hyperlipidemia Father    ROS: Review of Systems  Gastrointestinal: Negative for nausea and vomiting.  Musculoskeletal:       Negative for muscle weakness.   PHYSICAL EXAM: Pt in no acute distress  RECENT LABS AND TESTS: BMET    Component Value Date/Time   NA 140 04/06/2018 0840   K 4.1 04/06/2018 0840   CL 99 04/06/2018 0840   CO2 25 04/06/2018 0840   GLUCOSE 135 (H) 04/06/2018 0840   GLUCOSE 118 (H) 09/08/2015 1514   BUN 15 04/06/2018 0840   CREATININE 0.93 04/06/2018 0840   CREATININE 0.85 09/08/2015 1514   CALCIUM 10.3 (H) 04/06/2018 0840   GFRNONAA 75 04/06/2018 0840   GFRNONAA 85 09/08/2015 1514   GFRAA 86 04/06/2018 0840   GFRAA >89 09/08/2015 1514   Lab Results  Component Value Date   HGBA1C 5.5 05/01/2018   HGBA1C 5.6 12/13/2017   HGBA1C 5.0 09/05/2017   HGBA1C 5.8 (H) 05/23/2017   HGBA1C 5.7 04/12/2017   Lab Results  Component Value Date   INSULIN 30.4 (H) 05/01/2018   INSULIN 39.0 (H) 12/13/2017   INSULIN 24.3 09/05/2017   INSULIN 30.4 (H) 05/23/2017   CBC    Component Value Date/Time   WBC 7.2 05/23/2017 1249   WBC 8.3 08/27/2015 1633   RBC 4.66 05/23/2017 1249   RBC 4.27 08/27/2015 1633   HGB 14.7 05/23/2017 1249    HCT 43.3 05/23/2017 1249   PLT 213 04/05/2017 0947   MCV 93 05/23/2017 1249   MCH 31.5 05/23/2017 1249   MCH 31.1 08/27/2015 1633   MCHC 33.9 05/23/2017 1249   MCHC 34.3 08/27/2015 1633   RDW 13.6 05/23/2017 1249   LYMPHSABS 2.8 05/23/2017 1249   MONOABS 0.4 10/27/2013 1604   EOSABS 0.3 05/23/2017 1249   BASOSABS 0.0 05/23/2017 1249   Iron/TIBC/Ferritin/ %Sat    Component Value Date/Time   IRON 95 06/27/2013 1645   TIBC 371 06/27/2013 1645   FERRITIN 401 (H) 06/27/2013 1645   IRONPCTSAT 26 06/27/2013 1645   Lipid Panel     Component Value Date/Time   CHOL 396 (H) 04/06/2018 0840   TRIG 297 (H) 04/06/2018 0840   HDL 65 04/06/2018 0840   CHOLHDL 6.1 (H) 04/06/2018 0840   CHOLHDL 5.0 06/06/2013 1654   VLDL 27 06/06/2013 1654   LDLCALC 272 (H) 04/06/2018 0840   Hepatic Function Panel     Component Value Date/Time   PROT 5.8 (L) 04/06/2018 0840   ALBUMIN 3.6 (L) 04/06/2018 0840   AST 65 (H) 04/06/2018 0840   ALT 106 (H) 04/06/2018 0840   ALKPHOS 155 (H) 04/06/2018 0840   BILITOT 0.8 04/06/2018 0840   BILIDIR 0.2 04/07/2015 1349   IBILI 1.0 04/07/2015 1349      Component Value Date/Time   TSH 5.010 (H) 05/01/2018 1031   TSH 5.570 (H) 12/13/2017 1014   TSH 8.710 (H) 09/05/2017 0929   Results for PEIGHTYN, ROBERSON (MRN [id number]) as of 06/17/2018 17:07  Ref. Range 05/01/2018 10:31  Vitamin D, 25-Hydroxy Latest Ref Range: 30.0 - 100.0 ng/mL 36.0   I, Michaelene Song, am acting as Location manager for [employer], PA-C I, Abby Potash, PA-C have reviewed above note and agree with its content

## 2018-06-21 ENCOUNTER — Ambulatory Visit (INDEPENDENT_AMBULATORY_CARE_PROVIDER_SITE_OTHER): Payer: BC Managed Care – PPO | Admitting: Family Medicine

## 2018-06-21 ENCOUNTER — Other Ambulatory Visit: Payer: Self-pay

## 2018-06-21 DIAGNOSIS — Z789 Other specified health status: Secondary | ICD-10-CM

## 2018-06-21 DIAGNOSIS — M25521 Pain in right elbow: Secondary | ICD-10-CM | POA: Diagnosis not present

## 2018-06-21 DIAGNOSIS — E781 Pure hyperglyceridemia: Secondary | ICD-10-CM | POA: Diagnosis not present

## 2018-06-21 DIAGNOSIS — E785 Hyperlipidemia, unspecified: Secondary | ICD-10-CM

## 2018-06-21 NOTE — Patient Instructions (Signed)
° ° ° °  If you have lab work done today you will be contacted with your lab results within the next 2 weeks.  If you have not heard from us then please contact us. The fastest way to get your results is to register for My Chart. ° ° °IF you received an x-ray today, you will receive an invoice from [facility]. Please contact [facility] at [phone] with questions or concerns regarding your invoice.  ° °IF you received labwork today, you will receive an invoice from [facility]. Please contact [facility] at [phone] with questions or concerns regarding your invoice.  ° °Our billing staff will not be able to assist you with questions regarding bills from these companies. ° °You will be contacted with the lab results as soon as they are available. The fastest way to get your results is to activate your My Chart account. Instructions are located on the last page of this paperwork. If you have not heard from us regarding the results in 2 weeks, please contact this office. °  ° ° ° °

## 2018-06-21 NOTE — Progress Notes (Signed)
Telemedicine Encounter- SOAP NOTE Established Patient  This telephone encounter was conducted with the patient's (or proxy's) verbal consent via audio telecommunications: yes/no: Yes Patient was instructed to have this encounter in a suitably private space; and to only have persons present to whom they give permission to participate. In addition, patient identity was confirmed by use of name plus two identifiers (DOB and address).  I discussed the limitations, risks, security and privacy concerns of performing an evaluation and management service by telephone and the availability of in person appointments. I also discussed with the patient that there may be a patient responsible charge related to this service. The patient expressed understanding and agreed to proceed.  I spent a total of TIME; 0 MIN TO 60 MIN: 15 minutes talking with the patient or their proxy.  Chief Complaint  Patient presents with  . Hyperlipidemia    3 mon f/u on cholesterol, patient was informed she has preordered labs in the system she is willing to come into the office next week for lab work. Look at last office notes look like you have referred patient to a lipid clinic I did not have a chance to ask her about this at the time of the phone conversation.    Subjective   Melanie [name] is a 43 y.o. established patient. 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 She has not been able to get much exercise due to her elbow injury.  She is taking her welchol without any side effects She is trying to adhere to a healthy diet Lab Results  Component Value Date   CHOL 396 (H) 04/06/2018   CHOL 383 (H) 12/13/2017   CHOL 242  (H) 09/05/2017   Lab Results  Component Value Date   HDL 65 04/06/2018   HDL 58 12/13/2017   HDL 72 09/05/2017   Lab Results  Component Value Date   LDLCALC 272 (H) 04/06/2018   LDLCALC 279 (H) 12/13/2017   LDLCALC 138 (H) 09/05/2017   Lab Results  Component Value Date   TRIG 297 (H) 04/06/2018   TRIG 229 (H) 12/13/2017   TRIG 159 (H) 09/05/2017   Lab Results  Component Value Date   CHOLHDL 6.1 (H) 04/06/2018   CHOLHDL 6.6 (H) 12/13/2017   CHOLHDL 5.4 (H) 03/30/2017   No results found for: LDLDIRECT   Patient Active Problem List   Diagnosis Date Noted  . Chronic pain of both feet 01/23/2018  . Bilateral calcaneal spurs 01/23/2018  . Other fatigue 05/23/2017  . Shortness of breath on exertion 05/23/2017  . Prediabetes 05/23/2017  . Liver fibrosis 05/23/2017  . Hyperlipidemia 04/16/2017  . OSA (obstructive sleep apnea) 11/23/2015  . NASH (nonalcoholic steatohepatitis) 08/27/2015  . Dependent edema 08/27/2015  . BMI 40.0-44.9, adult ([place]) 03/15/2015  . Hepatic fibrosis 06/08/2014  . Depression with anxiety 06/06/2013  . Perennial allergic rhinitis 06/06/2013  . Infertility associated with anovulation 06/06/2013    Past Medical History:  Diagnosis Date  . Allergy   . Anemia   . Anxiety   . Back pain   . Constipation   . Depression   . Fatty liver   . GERD (gastroesophageal reflux disease)   .  Hyperlipidemia   . Infertility, female   . Joint pain   . Kidney problem   . Membranous nephrosis   . Multiple food allergies   . Muscle pain   . Numbness and tingling in both hands   . Pre-diabetes   . Shortness of breath   . Sleep apnea   . Stress   . Swelling of both lower extremities     Current Outpatient Medications  Medication Sig Dispense Refill  . buPROPion (WELLBUTRIN SR) 150 MG 12 hr tablet Take 1 tablet (150 mg total) by mouth 2 (two) times daily. 60 tablet 2  . busPIRone (BUSPAR) 15 MG tablet TAKE ONE TABLET BY MOUTH TWICE A DAY OR AS DIRECTED.  (Patient taking differently: Take 15 mg by mouth 2 (two) times daily. TAKE ONE TABLET BY MOUTH TWICE A DAY OR AS DIRECTED.) 180 tablet 3  . cetirizine (ZYRTEC) 10 MG tablet Take 10 mg by mouth daily.    . chlorthalidone (HYGROTON) 25 MG tablet Take 1 tablet (25 mg total) by mouth daily. 30 tablet 2  . citalopram (CELEXA) 20 MG tablet TAKE ONE TABLET BY MOUTH DAILY (Patient taking differently: Take 20 mg by mouth daily. TAKE ONE TABLET BY MOUTH DAILY) 90 tablet 3  . colesevelam (WELCHOL) 625 MG tablet Take 1 tablet (625 mg total) by mouth 2 (two) times daily with a meal. (Patient taking differently: Take 625 mg by mouth daily with breakfast. ) 60 tablet 2  . diclofenac (VOLTAREN) 75 MG EC tablet Take 1 tablet (75 mg total) by mouth 2 (two) times daily. (Patient not taking: Reported on 05/21/2018) 50 tablet 0  . fluticasone (FLONASE) 50 MCG/ACT nasal spray Place 2 sprays into both nostrils daily. (Patient taking differently: Place 2 sprays into both nostrils daily as needed for allergies. ) 48 g 3  . levothyroxine (SYNTHROID, LEVOTHROID) 75 MCG tablet Take 1 tablet (75 mcg total) by mouth daily. 30 tablet 2  . LORazepam (ATIVAN) 1 MG tablet Take 1 tablet (1 mg total) by mouth 2 (two) times daily as needed for anxiety. 20 tablet 0  . metFORMIN (GLUCOPHAGE) 500 MG tablet Take 1 tablet (500 mg total) by mouth daily with breakfast. 30 tablet 2  . montelukast (SINGULAIR) 10 MG tablet Take 1 tablet (10 mg total) by mouth at bedtime. 30 tablet 11  . Multiple Vitamin (MULTIVITAMIN) tablet Take 1 tablet by mouth daily.    . Omega-3 Fatty Acids (FISH OIL PO) Take 1 capsule by mouth 3 (three) times daily.     . Vitamin D, Ergocalciferol, (DRISDOL) 1.25 MG (50000 UT) CAPS capsule TAKE ONE CAPSULE BY MOUTH TWICE WEEKLY (Patient taking differently: Take 50,000 Units by mouth 2 (two) times a week. ) 10 capsule 0   Current Facility-Administered Medications  Medication Dose Route Frequency Provider Last Rate Last Dose   . cefTRIAXone (ROCEPHIN) injection 1 g  1 g Intramuscular Once Santiago, Irma M, MD        Allergies  Allergen Reactions  . Lidocaine Other (See Comments)    Flu like sx's  . Relafen [Nabumetone] Other (See Comments)    migraine  . Celebrex [Celecoxib] Rash  . Sulfa Antibiotics Rash    Social History   Socioeconomic History  . Marital status: Married    Spouse name: [name]  . Number of children: 5  . Years of education: in Master's program  . Highest education level: Bachelor's degree (e.g., BA, AB, BS)  Occupational History  . Occupation: TEACHER      Employer: [employer]    Comment: ESL  Social Needs  . Financial resource strain: Not on file  . Food insecurity:    Worry: Not on file    Inability: Not on file  . Transportation needs:    Medical: Not on file    Non-medical: Not on file  Tobacco Use  . Smoking status: Never Smoker  . Smokeless tobacco: Never Used  Substance and Sexual Activity  . Alcohol use: No    Alcohol/week: 0.0 standard drinks  . Drug use: No  . Sexual activity: Yes    Partners: Male    Birth control/protection: Post-menopausal  Lifestyle  . Physical activity:    Days per week: Not on file    Minutes per session: Not on file  . Stress: Not on file  Relationships  . Social connections:    Talks on phone: Not on file    Gets together: Not on file    Attends religious service: Not on file    Active member of club or organization: Not on file    Attends meetings of clubs or organizations: Not on file    Relationship status: Not on file  . Intimate partner violence:    Fear of current or ex partner: Not on file    Emotionally abused: Not on file    Physically abused: Not on file    Forced sexual activity: Not on file  Other Topics Concern  . Not on file  Social History Narrative   Lives with her husband and their 5 adpoted children.    Adopted 2 sets of siblings, all with psychiatric diagnoses (ADHD, Asperger's, ODD, PTSD,  depression, bipolar disorder).   Her husband has Asperger's, and works in disability services.   Occasionally drinks coke or pepsi     ROS  Objective   Vitals as reported by the patient: There were no vitals filed for this visit.  Maylin was seen today for hyperlipidemia.  Diagnoses and all orders for this visit:  Statin intolerance- reorded lipid clinic so that patient can continue her evaluation for other options than Welchol -     AMB Referral to [facility] -     TSH; Future -     Lipid panel; Future -     CMP14+EGFR; Future  Dyslipidemia -     AMB Referral to [facility] -     Lipid panel; Future -     CMP14+EGFR; Future  Hypertriglyceridemia-  Discussed her lipids  -     AMB Referral to [facility] -     TSH; Future -     Lipid panel; Future -     CMP14+EGFR; Future  Right elbow pain- continue orthopedic recs     I discussed the assessment and treatment plan with the patient. The patient was provided an opportunity to ask questions and all were answered. The patient agreed with the plan and demonstrated an understanding of the instructions.   The patient was advised to call back or seek an in-person evaluation if the symptoms worsen or if the condition fails to improve as anticipated.  I provided 15 minutes of non-face-to-face time during this encounter.  Zoe A Stallings, MD  [facility] [place]   

## 2018-06-27 ENCOUNTER — Ambulatory Visit (INDEPENDENT_AMBULATORY_CARE_PROVIDER_SITE_OTHER): Payer: BC Managed Care – PPO | Admitting: Family Medicine

## 2018-06-27 ENCOUNTER — Other Ambulatory Visit: Payer: Self-pay

## 2018-06-27 DIAGNOSIS — Z789 Other specified health status: Secondary | ICD-10-CM

## 2018-06-27 DIAGNOSIS — E785 Hyperlipidemia, unspecified: Secondary | ICD-10-CM

## 2018-06-27 DIAGNOSIS — E781 Pure hyperglyceridemia: Secondary | ICD-10-CM

## 2018-06-28 LAB — CMP14+EGFR
ALT: 59 IU/L — ABNORMAL HIGH (ref 0–32)
AST: 35 IU/L (ref 0–40)
Albumin/Globulin Ratio: 1.8 (ref 1.2–2.2)
Albumin: 3.9 g/dL (ref 3.8–4.8)
Alkaline Phosphatase: 132 IU/L — ABNORMAL HIGH (ref 39–117)
BUN/Creatinine Ratio: 18 (ref 9–23)
BUN: 16 mg/dL (ref 6–24)
Bilirubin Total: 0.9 mg/dL (ref 0.0–1.2)
CO2: 25 mmol/L (ref 20–29)
Calcium: 10.9 mg/dL — ABNORMAL HIGH (ref 8.7–10.2)
Chloride: 99 mmol/L (ref 96–106)
Creatinine, Ser: 0.9 mg/dL (ref 0.57–1.00)
GFR calc Af Amer: 90 mL/min/{1.73_m2} (ref >59)
GFR calc non Af Amer: 78 mL/min/{1.73_m2} (ref >59)
Globulin, Total: 2.2 g/dL (ref 1.5–4.5)
Glucose: 127 mg/dL — ABNORMAL HIGH (ref 65–99)
Potassium: 3.9 mmol/L (ref 3.5–5.2)
Sodium: 142 mmol/L (ref 134–144)
Total Protein: 6.1 g/dL (ref 6.0–8.5)

## 2018-06-28 LAB — LIPID PANEL
Chol/HDL Ratio: 6.4 ratio — ABNORMAL HIGH (ref 0.0–4.4)
Cholesterol, Total: 389 mg/dL — ABNORMAL HIGH (ref 100–199)
HDL: 61 mg/dL (ref >39)
LDL Calculated: 286 mg/dL — ABNORMAL HIGH (ref 0–99)
Triglycerides: 209 mg/dL — ABNORMAL HIGH (ref 0–149)
VLDL Cholesterol Cal: 42 mg/dL — ABNORMAL HIGH (ref 5–40)

## 2018-06-28 LAB — TSH: TSH: 6.17 u[IU]/mL — ABNORMAL HIGH (ref 0.450–4.500)

## 2018-07-01 ENCOUNTER — Other Ambulatory Visit: Payer: Self-pay

## 2018-07-01 ENCOUNTER — Ambulatory Visit (INDEPENDENT_AMBULATORY_CARE_PROVIDER_SITE_OTHER): Payer: BC Managed Care – PPO | Admitting: Physician Assistant

## 2018-07-01 ENCOUNTER — Encounter (INDEPENDENT_AMBULATORY_CARE_PROVIDER_SITE_OTHER): Payer: Self-pay | Admitting: Physician Assistant

## 2018-07-01 DIAGNOSIS — E559 Vitamin D deficiency, unspecified: Secondary | ICD-10-CM

## 2018-07-01 DIAGNOSIS — Z6841 Body Mass Index (BMI) 40.0 and over, adult: Secondary | ICD-10-CM

## 2018-07-01 DIAGNOSIS — I10 Essential (primary) hypertension: Secondary | ICD-10-CM

## 2018-07-01 MED ORDER — CHLORTHALIDONE 25 MG PO TABS: 25.0000 mg | ORAL_TABLET | Freq: Every day | ORAL | 0 refills | Status: DC

## 2018-07-01 MED ORDER — VITAMIN D (ERGOCALCIFEROL) 1.25 MG (50000 UNIT) PO CAPS: ORAL_CAPSULE | ORAL | 0 refills | Status: DC

## 2018-07-01 NOTE — Progress Notes (Signed)
Office: [phone]  /  Fax: [phone] TeleHealth Visit:  Melanie [name] has verbally consented to this TeleHealth visit today. The patient is located at home, the provider is located at the [employer] [facility] office. The participants in this visit include the listed provider and patient. The visit was conducted today via Webex.  HPI:   Chief Complaint: OBESITY Melanie [name] is here to discuss her progress with her obesity treatment plan. She is keeping a food journal with 1450-1550 calories and 95 grams of protein daily and is following her eating plan approximately 75% of the time. She states she is exercising 0 minutes 0 times per week. Melanie [name] reports that she is doing well with journaling. She is able to get her protein in most days. She states her legs are less swollen than they have been. We were unable to weigh the patient today for this TeleHealth visit. She feels as if she has lost 3-4 lbs since her last visit. She has lost 11 lbs since starting treatment with [place].  Vitamin D deficiency Melanie [name] has a diagnosis of Vitamin D deficiency. She is currently taking prescription Vit D and denies nausea, vomiting or muscle weakness.  Hypertension Melanie [name] is a 43 y.o. female with hypertension and is on chlorthalidone.  Melanie [name] denies chest pain or headache. She is working weight loss to help control her blood pressure with the goal of decreasing her risk of heart attack and stroke.   ASSESSMENT AND PLAN:  Vitamin D deficiency - Plan: Vitamin D, Ergocalciferol, (DRISDOL) 1.25 MG (50000 UT) CAPS capsule  Essential hypertension - Plan: chlorthalidone (HYGROTON) 25 MG tablet  Class 3 severe obesity with serious comorbidity and body mass index (BMI) of 50.0 to 59.9 in adult, unspecified obesity type ([place])  PLAN:  Vitamin D Deficiency Melanie [name] was informed that low Vitamin D levels contributes to fatigue and are associated with obesity, breast, and colon cancer. She agrees to continue to  take prescription Vit D @ 50,000 IU twice weekly #10 with 0 refills and will follow-up for routine testing of Vitamin D, at least 2-3 times per year. She was informed of the risk of over-replacement of Vitamin D and agrees to not increase her dose unless she discusses this with [place] first. Melanie [name] agrees to follow-up with our clinic in 2 weeks.  Hypertension We discussed sodium restriction, working on healthy weight loss, and a regular exercise program as the means to achieve improved blood pressure control. Melanie [name] agreed with this plan and agreed to follow up as directed. We will continue to monitor her blood pressure as well as her progress with the above lifestyle modifications. She was given a refill on her chlorthalidone #30 with 0 refills and agrees to follow-up with our clinic in 2 weeks. She will watch for signs of hypotension as she continues her lifestyle modifications.  Obesity Melanie [name] is currently in the action stage of change. As such, her goal is to continue with weight loss efforts.  She has agreed to keep a food journal with 1450-1550 calories and 95  grams of protein daily. Melanie [name] has been instructed to work up to a goal of 150 minutes of combined cardio and strengthening exercise per week for weight loss and overall health benefits. We discussed the following Behavioral Modification Strategies today: work on meal planning, easy cooking plans, and keeping healthy foods in the home.  Melanie [name] has agreed to follow-up with our clinic in 2 weeks. She was informed of the importance  of frequent follow-up visits to maximize her success with intensive lifestyle modifications for her multiple health conditions.  ALLERGIES: Allergies  Allergen Reactions  . Lidocaine Other (See Comments)    Flu like sx's  . Relafen [Nabumetone] Other (See Comments)    migraine  . Celebrex [Celecoxib] Rash  . Sulfa Antibiotics Rash    MEDICATIONS: Current Outpatient Medications on File Prior to Visit  Medication Sig  Dispense Refill  . buPROPion (WELLBUTRIN SR) 150 MG 12 hr tablet Take 1 tablet (150 mg total) by mouth 2 (two) times daily. 60 tablet 2  . busPIRone (BUSPAR) 15 MG tablet TAKE ONE TABLET BY MOUTH TWICE A DAY OR AS DIRECTED. (Patient taking differently: Take 15 mg by mouth 2 (two) times daily. TAKE ONE TABLET BY MOUTH TWICE A DAY OR AS DIRECTED.) 180 tablet 3  . cetirizine (ZYRTEC) 10 MG tablet Take 10 mg by mouth daily.    . citalopram (CELEXA) 20 MG tablet TAKE ONE TABLET BY MOUTH DAILY (Patient taking differently: Take 20 mg by mouth daily. TAKE ONE TABLET BY MOUTH DAILY) 90 tablet 3  . colesevelam (WELCHOL) 625 MG tablet Take 1 tablet (625 mg total) by mouth 2 (two) times daily with a meal. (Patient taking differently: Take 625 mg by mouth daily with breakfast. ) 60 tablet 2  . diclofenac (VOLTAREN) 75 MG EC tablet Take 1 tablet (75 mg total) by mouth 2 (two) times daily. (Patient not taking: Reported on 05/21/2018) 50 tablet 0  . fluticasone (FLONASE) 50 MCG/ACT nasal spray Place 2 sprays into both nostrils daily. (Patient taking differently: Place 2 sprays into both nostrils daily as needed for allergies. ) 48 g 3  . levothyroxine (SYNTHROID, LEVOTHROID) 75 MCG tablet Take 1 tablet (75 mcg total) by mouth daily. 30 tablet 2  . LORazepam (ATIVAN) 1 MG tablet Take 1 tablet (1 mg total) by mouth 2 (two) times daily as needed for anxiety. 20 tablet 0  . metFORMIN (GLUCOPHAGE) 500 MG tablet Take 1 tablet (500 mg total) by mouth daily with breakfast. 30 tablet 2  . montelukast (SINGULAIR) 10 MG tablet Take 1 tablet (10 mg total) by mouth at bedtime. 30 tablet 11  . Multiple Vitamin (MULTIVITAMIN) tablet Take 1 tablet by mouth daily.    . Omega-3 Fatty Acids (FISH OIL PO) Take 1 capsule by mouth 3 (three) times daily.      Current Facility-Administered Medications on File Prior to Visit  Medication Dose Route Frequency Provider Last Rate Last Dose  . cefTRIAXone (ROCEPHIN) injection 1 g  1 g  Intramuscular Once Rutherford Guys, MD        PAST MEDICAL HISTORY: Past Medical History:  Diagnosis Date  . Allergy   . Anemia   . Anxiety   . Back pain   . Constipation   . Depression   . Fatty liver   . GERD (gastroesophageal reflux disease)   . Hyperlipidemia   . Infertility, female   . Joint pain   . Kidney problem   . Membranous nephrosis   . Multiple food allergies   . Muscle pain   . Numbness and tingling in both hands   . Pre-diabetes   . Shortness of breath   . Sleep apnea   . Stress   . Swelling of both lower extremities     PAST SURGICAL HISTORY: Past Surgical History:  Procedure Laterality Date  . FINGER SURGERY Left     index finger  . KNEE SURGERY Left   .  LIVER BIOPSY N/A 05/15/2014   Procedure: LIVER BIOPSY;  Surgeon: Inda Castle, MD;  Location: [facility];  Service: Endoscopy;  Laterality: N/A;  ultrasound to mark the liver  . TONSILLECTOMY AND ADENOIDECTOMY  1978  . TYMPANOSTOMY TUBE PLACEMENT    . wrist cyst Left   . WRIST SURGERY     left - cyst removed    SOCIAL HISTORY: Social History   Tobacco Use  . Smoking status: Never Smoker  . Smokeless tobacco: Never Used  Substance Use Topics  . Alcohol use: No    Alcohol/week: 0.0 standard drinks  . Drug use: No    FAMILY HISTORY: Family History  Problem Relation Age of Onset  . Diabetes Mother   . Liver disease Mother        ESLD- unknown etiology  . Obesity Mother   . Diabetes Maternal Grandmother   . Diabetes Paternal Grandmother   . Breast cancer Paternal Grandmother   . Stroke Maternal Grandfather   . Hyperlipidemia Father    ROS: Review of Systems  Cardiovascular: Negative for chest pain.  Gastrointestinal: Negative for nausea and vomiting.  Musculoskeletal:       Negative for muscle weakness.  Neurological: Negative for headaches.   PHYSICAL EXAM: Pt in no acute distress  RECENT LABS AND TESTS: BMET    Component Value Date/Time   NA 142 06/27/2018 1139   K  3.9 06/27/2018 1139   CL 99 06/27/2018 1139   CO2 25 06/27/2018 1139   GLUCOSE 127 (H) 06/27/2018 1139   GLUCOSE 118 (H) 09/08/2015 1514   BUN 16 06/27/2018 1139   CREATININE 0.90 06/27/2018 1139   CREATININE 0.85 09/08/2015 1514   CALCIUM 10.9 (H) 06/27/2018 1139   GFRNONAA 78 06/27/2018 1139   GFRNONAA 85 09/08/2015 1514   GFRAA 90 06/27/2018 1139   GFRAA >89 09/08/2015 1514   Lab Results  Component Value Date   HGBA1C 5.5 05/01/2018   HGBA1C 5.6 12/13/2017   HGBA1C 5.0 09/05/2017   HGBA1C 5.8 (H) 05/23/2017   HGBA1C 5.7 04/12/2017   Lab Results  Component Value Date   INSULIN 30.4 (H) 05/01/2018   INSULIN 39.0 (H) 12/13/2017   INSULIN 24.3 09/05/2017   INSULIN 30.4 (H) 05/23/2017   CBC    Component Value Date/Time   WBC 7.2 05/23/2017 1249   WBC 8.3 08/27/2015 1633   RBC 4.66 05/23/2017 1249   RBC 4.27 08/27/2015 1633   HGB 14.7 05/23/2017 1249   HCT 43.3 05/23/2017 1249   PLT 213 04/05/2017 0947   MCV 93 05/23/2017 1249   MCH 31.5 05/23/2017 1249   MCH 31.1 08/27/2015 1633   MCHC 33.9 05/23/2017 1249   MCHC 34.3 08/27/2015 1633   RDW 13.6 05/23/2017 1249   LYMPHSABS 2.8 05/23/2017 1249   MONOABS 0.4 10/27/2013 1604   EOSABS 0.3 05/23/2017 1249   BASOSABS 0.0 05/23/2017 1249   Iron/TIBC/Ferritin/ %Sat    Component Value Date/Time   IRON 95 06/27/2013 1645   TIBC 371 06/27/2013 1645   FERRITIN 401 (H) 06/27/2013 1645   IRONPCTSAT 26 06/27/2013 1645   Lipid Panel     Component Value Date/Time   CHOL 389 (H) 06/27/2018 1139   TRIG 209 (H) 06/27/2018 1139   HDL 61 06/27/2018 1139   CHOLHDL 6.4 (H) 06/27/2018 1139   CHOLHDL 5.0 06/06/2013 1654   VLDL 27 06/06/2013 1654   LDLCALC 286 (H) 06/27/2018 1139   Hepatic Function Panel     Component Value Date/Time  PROT 6.1 06/27/2018 1139   ALBUMIN 3.9 06/27/2018 1139   AST 35 06/27/2018 1139   ALT 59 (H) 06/27/2018 1139   ALKPHOS 132 (H) 06/27/2018 1139   BILITOT 0.9 06/27/2018 1139   BILIDIR 0.2  04/07/2015 1349   IBILI 1.0 04/07/2015 1349      Component Value Date/Time   TSH 6.170 (H) 06/27/2018 1139   TSH 5.010 (H) 05/01/2018 1031   TSH 5.570 (H) 12/13/2017 1014   Results for TAHEERA, THOMANN (MRN [id number]) as of 07/01/2018 15:34  Ref. Range 05/01/2018 10:31  Vitamin D, 25-Hydroxy Latest Ref Range: 30.0 - 100.0 ng/mL 36.0   I, Michaelene Song, am acting as Location manager for [employer], PA-C I, Abby Potash, PA-C have reviewed above note and agree with its content

## 2018-07-02 ENCOUNTER — Other Ambulatory Visit: Payer: Self-pay | Admitting: Family Medicine

## 2018-07-02 ENCOUNTER — Other Ambulatory Visit (INDEPENDENT_AMBULATORY_CARE_PROVIDER_SITE_OTHER): Payer: Self-pay | Admitting: Physician Assistant

## 2018-07-02 DIAGNOSIS — F3289 Other specified depressive episodes: Secondary | ICD-10-CM

## 2018-07-02 DIAGNOSIS — R7303 Prediabetes: Secondary | ICD-10-CM

## 2018-07-02 DIAGNOSIS — E038 Other specified hypothyroidism: Secondary | ICD-10-CM

## 2018-07-02 NOTE — Telephone Encounter (Signed)
Requested medication (s) are due for refill today:  yes  Requested medication (s) are on the active medication list:  Yes  Future visit scheduled:  no  Last Refill: 04/06/18; #60; RF x2  Note to Clinic:  Recent Lipid panel 06/27/18 not reviewed yet by PCP; unsure if there would be change in therapy  Requested Prescriptions  Pending Prescriptions Disp Refills   colesevelam (WELCHOL) 625 MG tablet [Pharmacy Med Name: Colesevelam Hydrochloride 644m Tablet] 60 tablet 1    Sig: Take 1 tablet by mouth twice daily with meals.     Cardiovascular:  Antilipid - Bile Acid Sequestrants Failed - 07/02/2018  7:30 AM      Failed - Total Cholesterol in normal range and within 360 days    Cholesterol, Total  Date Value Ref Range Status  06/27/2018 389 (H) 100 - 199 mg/dL Final         Failed - LDL in normal range and within 360 days    LDL Calculated  Date Value Ref Range Status  06/27/2018 286 (H) 0 - 99 mg/dL Final         Failed - Triglycerides in normal range and within 360 days    Triglycerides  Date Value Ref Range Status  06/27/2018 209 (H) 0 - 149 mg/dL Final         Passed - HDL in normal range and within 360 days    HDL  Date Value Ref Range Status  06/27/2018 61 >39 mg/dL Final         Passed - Valid encounter within last 12 months    Recent Outpatient Visits          [facility]   [facility] week [facility]   2 months [facility]   4 months ago [facility]yl[facility]   5 months ago [facility] of both feet   [facility]

## 2018-07-04 ENCOUNTER — Telehealth: Payer: Self-pay | Admitting: Cardiology

## 2018-07-04 NOTE — Telephone Encounter (Signed)
New Patient - lmtcb to schedule/dr hochrein/dc

## 2018-07-08 ENCOUNTER — Ambulatory Visit: Payer: Self-pay | Admitting: *Deleted

## 2018-07-08 NOTE — Telephone Encounter (Signed)
Questions regarding the need to be tested. Has had unusual fatigue for 2 weeks, Headaches daily, ST on the left side, chest tightness in the center with feeling like not getting enough air in-yesterday and today. Chest tightness is with exertion and at rest. Lightheadedness with feeling woozy but no vertigo this is with resting and on exertion, over the last few days. No ear pain but mild congestion, no runny nose.  No fever.No cough.No vomiting/diarrhea.  No travels and no known exposures.working from home at this time. No one else in the home is sick. Taken advil or excedrin for the headaches but didn't seem to help.Has Singulair and zyrtec for allergies.  No wheezing.Reviewed isolation and the 3 day with no symptoms and no medications for discontinuing isolation, wearing mask if must leave home, sanitizing and hand washing discussed.  Attempted warm transfer for virtual appointment-no answer. Routing encounter to PCP to reach out to patient for appointment. Smart phone [phone]. Reason for Disposition . MILD difficulty breathing (e.g., minimal/no SOB at rest, SOB with walking, pulse <100)  Answer Assessment - Initial Assessment Questions 1. COVID-19 DIAGNOSIS: "Who made your Coronavirus (COVID-19) diagnosis?" "Was it confirmed by a positive lab test?" If not diagnosed by a HCP, ask "Are there lots of cases (community spread) where you live?" (See public health department website, if unsure)   * MAJOR community spread: high number of cases; numbers of cases are increasing; many people hospitalized.   * MINOR community spread: low number of cases; not increasing; few or no people hospitalized     none 2. ONSET: "When did the COVID-19 symptoms start?"      2-3 weeks ago  3. WORST SYMPTOM: "What is your worst symptom?" (e.g., cough, fever, shortness of breath, muscle aches)     fatigue 4. COUGH: "How bad is the cough?"       no 5. FEVER: "Do you have a fever?" If so, ask: "What is your temperature,  how was it measured, and when did it start?"     None she is aware 6. RESPIRATORY STATUS: "Describe your breathing?" (e.g., shortness of breath, wheezing, unable to speak)      Shortness of breath yesterday, just could not get enough air in.  7. BETTER-SAME-WORSE: "Are you getting better, staying the same or getting worse compared to yesterday?"  If getting worse, ask, "In what way?"     same 8. HIGH RISK DISEASE: "Do you have any chronic medical problems?" (e.g., asthma, heart or lung disease, weak immune system, etc.)     no 9. PREGNANCY: "Is there any chance you are pregnant?" "When was your last menstrual period?"     no 10. OTHER SYMPTOMS: "Do you have any other symptoms?"  (e.g., runny nose, headache, sore throat, loss of smell)       Headaches daily, ST last 3-4 days. Chest tightness lasted less than an hour yesterday.  Protocols used: CORONAVIRUS (COVID-19) DIAGNOSED OR SUSPECTED-A-AH

## 2018-07-09 ENCOUNTER — Telehealth (INDEPENDENT_AMBULATORY_CARE_PROVIDER_SITE_OTHER): Payer: BC Managed Care – PPO | Admitting: Family Medicine

## 2018-07-09 ENCOUNTER — Other Ambulatory Visit: Payer: Self-pay

## 2018-07-09 VITALS — BP 107/74 | HR 82 | Temp 98.6°F | Resp 17 | Ht 68.0 in | Wt 329.0 lb

## 2018-07-09 DIAGNOSIS — F419 Anxiety disorder, unspecified: Secondary | ICD-10-CM

## 2018-07-09 DIAGNOSIS — R0609 Other forms of dyspnea: Secondary | ICD-10-CM | POA: Diagnosis not present

## 2018-07-09 DIAGNOSIS — R319 Hematuria, unspecified: Secondary | ICD-10-CM | POA: Diagnosis not present

## 2018-07-09 DIAGNOSIS — I10 Essential (primary) hypertension: Secondary | ICD-10-CM

## 2018-07-09 DIAGNOSIS — E039 Hypothyroidism, unspecified: Secondary | ICD-10-CM

## 2018-07-09 DIAGNOSIS — N390 Urinary tract infection, site not specified: Secondary | ICD-10-CM

## 2018-07-09 DIAGNOSIS — R42 Dizziness and giddiness: Secondary | ICD-10-CM | POA: Diagnosis not present

## 2018-07-09 LAB — POCT URINALYSIS DIP (MANUAL ENTRY)
Bilirubin, UA: NEGATIVE
Glucose, UA: NEGATIVE mg/dL
Ketones, POC UA: NEGATIVE mg/dL
Nitrite, UA: POSITIVE — AB
Protein Ur, POC: >=300 mg/dL — AB
Spec Grav, UA: >=1.03 — AB (ref 1.010–1.025)
Urobilinogen, UA: 0.2 E.U./dL
pH, UA: 5.5 (ref 5.0–8.0)

## 2018-07-09 LAB — GLUCOSE, POCT (MANUAL RESULT ENTRY): POC Glucose: 92 mg/dl (ref 70–99)

## 2018-07-09 LAB — POC MICROSCOPIC URINALYSIS (UMFC)

## 2018-07-09 MED ORDER — CITALOPRAM HYDROBROMIDE 40 MG PO TABS: 40.0000 mg | ORAL_TABLET | Freq: Every day | ORAL | 1 refills | Status: DC

## 2018-07-09 MED ORDER — NITROFURANTOIN MONOHYD MACRO 100 MG PO CAPS: 100.0000 mg | ORAL_CAPSULE | Freq: Two times a day (BID) | ORAL | 0 refills | Status: DC

## 2018-07-09 MED ORDER — LEVOTHYROXINE SODIUM 88 MCG PO TABS: 88.0000 ug | ORAL_TABLET | Freq: Every day | ORAL | 0 refills | Status: DC

## 2018-07-09 NOTE — Progress Notes (Signed)
Encounter- SOAP NOTE Established Patient WEBEX VIDEO  This [place] encounter was conducted with the patient's (or proxy's) verbal consent via audio telecommunications: yes/no: Yes Patient was instructed to have this encounter in a suitably private space; and to only have persons present to whom they give permission to participate. In addition, patient identity was confirmed by use of name plus two identifiers (DOB and address).  I discussed the limitations, risks, security and privacy concerns of performing an evaluation and management service by telephone and the availability of in person appointments. I also discussed with the patient that there may be a patient responsible charge related to this service. The patient expressed understanding and agreed to proceed.    CC: Lightheadedness, fatigue  Subjective   Melanie [name] is a 43 y.o. established patient. Telephone visit today for  HPI   Patient reports daily headaches with fatigue, sore throat 3-4 days She has some shortness of breath and she feels like she gets lightheaded and could not get enough oxygen 3 days ago she had to lay down because she felt lightheaded, and it took 30-45 minutes to resolve She states that there was no vertigo She states that she reports that she was eating and drinking her normal amounts She denies alcohol consumption She does not drink coffee  She states that she has not had an episode de like that for the past 3 days She states that a deep breath does not fill her lungs She states that she does not feel like she is getting what she needs from each breath.  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 She is getting variable amounts of sleep and can sleep for 6-12 hours.   Acquired Hypothyroidism She is taking levothyroxine 75 mcg She states that she feels very tired She states that she has been  sleeping in and feels very tired She also has some lightheadedness  Component     Latest Ref Rng & Units 09/05/2017 12/13/2017 05/01/2018 06/27/2018  Triiodothyronine (T3)     71 - 180 ng/dL 117 109 113   T4,Free(Direct)     0.82 - 1.77 ng/dL 1.02  1.21   TSH     0.450 - 4.500 uIU/mL 8.710 (H) 5.570 (H) 5.010 (H) 6.170 (H)      Patient Active Problem List   Diagnosis Date Noted  . Chronic pain of both feet 01/23/2018  . Bilateral calcaneal spurs 01/23/2018  . Other fatigue 05/23/2017  . Shortness of breath on exertion 05/23/2017  . Prediabetes 05/23/2017  . Liver fibrosis 05/23/2017  . Hyperlipidemia 04/16/2017  . OSA (obstructive sleep apnea) 11/23/2015  . NASH (nonalcoholic steatohepatitis) 08/27/2015  . Dependent edema 08/27/2015  . BMI 40.0-44.9, adult ([facility]) 03/15/2015  . Hepatic fibrosis 06/08/2014  . Depression with anxiety 06/06/2013  . Perennial allergic rhinitis 06/06/2013  . Infertility associated with anovulation 06/06/2013    Past Medical History:  Diagnosis Date  . Allergy   . Anemia   . Anxiety   . Back pain   . Constipation   . Depression   . Fatty liver   . GERD (gastroesophageal reflux disease)   . Hyperlipidemia   . Infertility, female   . Joint pain   . Kidney problem   . Membranous nephrosis   . Multiple food allergies   . Muscle pain   . Numbness  and tingling in both hands   . Pre-diabetes   . Shortness of breath   . Sleep apnea   . Stress   . Swelling of both lower extremities     Current Outpatient Medications  Medication Sig Dispense Refill  . buPROPion (WELLBUTRIN SR) 150 MG 12 hr tablet Take 1 tablet (150 mg total) by mouth 2 (two) times daily. 60 tablet 2  . busPIRone (BUSPAR) 15 MG tablet TAKE ONE TABLET BY MOUTH TWICE A DAY OR AS DIRECTED. (Patient taking differently: Take 15 mg by mouth 2 (two) times daily. TAKE ONE TABLET BY MOUTH TWICE A DAY OR AS DIRECTED.) 180 tablet 3  . cetirizine (ZYRTEC) 10 MG tablet Take 10 mg by mouth  daily.    . chlorthalidone (HYGROTON) 25 MG tablet Take 1 tablet (25 mg total) by mouth daily. 30 tablet 0  . citalopram (CELEXA) 40 MG tablet Take 1 tablet (40 mg total) by mouth daily. 90 tablet 1  . colesevelam (WELCHOL) 625 MG tablet Take 1 tablet (625 mg total) by mouth 2 (two) times daily with a meal. (Patient taking differently: Take 625 mg by mouth daily with breakfast. ) 60 tablet 2  . diclofenac (VOLTAREN) 75 MG EC tablet Take 1 tablet (75 mg total) by mouth 2 (two) times daily. 50 tablet 0  . fluticasone (FLONASE) 50 MCG/ACT nasal spray Place 2 sprays into both nostrils daily. (Patient taking differently: Place 2 sprays into both nostrils daily as needed for allergies. ) 48 g 3  . LORazepam (ATIVAN) 1 MG tablet Take 1 tablet (1 mg total) by mouth 2 (two) times daily as needed for anxiety. 20 tablet 0  . metFORMIN (GLUCOPHAGE) 500 MG tablet Take 1 tablet (500 mg total) by mouth daily with breakfast. 30 tablet 2  . montelukast (SINGULAIR) 10 MG tablet Take 1 tablet (10 mg total) by mouth at bedtime. 30 tablet 11  . Multiple Vitamin (MULTIVITAMIN) tablet Take 1 tablet by mouth daily.    . Omega-3 Fatty Acids (FISH OIL PO) Take 1 capsule by mouth 3 (three) times daily.     . Vitamin D, Ergocalciferol, (DRISDOL) 1.25 MG (50000 UT) CAPS capsule TAKE ONE CAPSULE BY MOUTH TWICE WEEKLY 10 capsule 0  . levothyroxine (SYNTHROID) 88 MCG tablet Take 1 tablet (88 mcg total) by mouth daily. 90 tablet 0   Current Facility-Administered Medications  Medication Dose Route Frequency Provider Last Rate Last Dose  . cefTRIAXone (ROCEPHIN) injection 1 g  1 g Intramuscular Once Rutherford Guys, MD        Allergies  Allergen Reactions  . Lidocaine Other (See Comments)    Flu like sx's  . Relafen [Nabumetone] Other (See Comments)    migraine  . Celebrex [Celecoxib] Rash  . Sulfa Antibiotics Rash    Social History   Socioeconomic History  . Marital status: Married    Spouse name: [name]  . Number  of children: 5  . Years of education: in [employer] program  . Highest education level: Bachelor's degree (e.g., BA, AB, BS)  Occupational History  . Occupation: Product manager: [employer]    Comment: ESL  Social Needs  . Financial resource strain: Not on file  . Food insecurity:    Worry: Not on file    Inability: Not on file  . Transportation needs:    Medical: Not on file    Non-medical: Not on file  Tobacco Use  . Smoking status: Never Smoker  .  Smokeless tobacco: Never Used  Substance and Sexual Activity  . Alcohol use: No    Alcohol/week: 0.0 standard drinks  . Drug use: No  . Sexual activity: Yes    Partners: Male    Birth control/protection: Post-menopausal  Lifestyle  . Physical activity:    Days per week: Not on file    Minutes per session: Not on file  . Stress: Not on file  Relationships  . Social connections:    Talks on phone: Not on file    Gets together: Not on file    Attends religious service: Not on file    Active member of club or organization: Not on file    Attends meetings of clubs or organizations: Not on file    Relationship status: Not on file  . Intimate partner violence:    Fear of current or ex partner: Not on file    Emotionally abused: Not on file    Physically abused: Not on file    Forced sexual activity: Not on file  Other Topics Concern  . Not on file  Social History Narrative   Lives with her husband and their 5 adpoted children.    Adopted 2 sets of siblings, all with psychiatric diagnoses (ADHD, Asperger's, ODD, PTSD, depression, bipolar disorder).   Her husband has Asperger's, and works in disability services.   Occasionally drinks coke or pepsi     ROS Review of Systems  Constitutional: Negative for activity change, appetite change, chills and fever.  HENT: Negative for congestion, nosebleeds, trouble swallowing and voice change.   Respiratory: Negative for cough, shortness of breath and wheezing.    Gastrointestinal: Negative for diarrhea, nausea and vomiting.  Genitourinary: Negative for difficulty urinating, dysuria, flank pain and hematuria.  Musculoskeletal: Negative for back pain, joint swelling and neck pain.  Neurological: see hpi See HPI. All other review of systems negative.   Objective   Vitals as reported by the patient: Today's Vitals   07/09/18 1000  BP: 132/88  Pulse: 69  Resp: 17  Temp: 98.6 F (37 C)  TempSrc: Oral  SpO2: 93%  Weight: (!) 329 lb (149.2 kg)  Height: 5' 8"  (1.727 m)   Wt Readings from Last 3 Encounters:  07/09/18 (!) 329 lb (149.2 kg)  06/27/18 (!) 331 lb (150.1 kg)  05/01/18 (!) 327 lb (148.3 kg)    Physical Exam Constitutional:      Appearance: Normal appearance. She is diaphoretic. She is not ill-appearing.  HENT:     Head: Normocephalic and atraumatic.     Nose: Nose normal. No congestion.     Mouth/Throat:     Mouth: Mucous membranes are moist.     Pharynx: No posterior oropharyngeal erythema.  Eyes:     Extraocular Movements: Extraocular movements intact.     Conjunctiva/sclera: Conjunctivae normal.  Neck:     Musculoskeletal: Normal range of motion and neck supple.     Vascular: No carotid bruit.  Cardiovascular:     Rate and Rhythm: Normal rate and regular rhythm.     Pulses: Normal pulses.     Heart sounds: Normal heart sounds. No murmur.  Pulmonary:     Effort: Pulmonary effort is normal. No respiratory distress.     Breath sounds: Normal breath sounds. No stridor. No wheezing or rhonchi.  Abdominal:     General: Abdomen is flat. Bowel sounds are normal. There is no distension.     Palpations: Abdomen is soft. There is no mass.  Tenderness: There is no abdominal tenderness. There is no guarding or rebound.     Hernia: No hernia is present.  Skin:    General: Skin is warm.     Capillary Refill: Capillary refill takes less than 2 seconds.  Neurological:     General: No focal deficit present.     Mental Status:  She is alert and oriented to person, place, and time.  Psychiatric:        Mood and Affect: Mood normal.        Behavior: Behavior normal.        Thought Content: Thought content normal.        Judgment: Judgment normal.      ECG: nsr, no twi, no arrhythmia   Diagnoses and all orders for this visit:  Acquired hypothyroidism -     levothyroxine (SYNTHROID) 88 MCG tablet; Take 1 tablet (88 mcg total) by mouth daily.  Lightheadedness -     EKG 12-Lead -     POCT glucose (manual entry) -     Orthostatic vital signs  Other form of dyspnea -     EKG 12-Lead  Anxiety -     citalopram (CELEXA) 40 MG tablet; Take 1 tablet (40 mg total) by mouth daily.  Essential hypertension    Will increase celexa to help with mood Hypothyroidism- Will increase levothyroxine to help with hypothyroid symptoms  Lightheadedness -Reviewed of meds shows that Welchol is a new med for her to help her lipids and she is having some of the symptoms caused by welchol Will stop welchol Orthostatic vitals, ECG normal in the office  UTI - Will treat for acute UTI with macrobid, awaiting culture Follow up in 3 weeks for re-evaluation after these changes have been made     I discussed the assessment and treatment plan with the patient. The patient was provided an opportunity to ask questions and all were answered. The patient agreed with the plan and demonstrated an understanding of the instructions.   The patient was advised to call back or seek an in-person evaluation if the symptoms worsen or if the condition fails to improve as anticipated.  I provided 45 minutes of non-face-to-face time during this encounter.  Forrest Moron, MD  Primary Care at [facility]   This web visit was converted to an in-person visit

## 2018-07-09 NOTE — Progress Notes (Signed)
CC: Intermittent tightness in chest x few days per pt 30-45 min during an episode it is hard to get oxygen and at other times she has some lightheadedness with the chest tightness, occasional sob, fatigue x a few weeks, st x 4-5 days that is worsening, no fevers noticed or taken, ha's x 3 weeks.  Per pt took tylenol and excedrin for ha's but no relief so she discontinued taking.  No nausea or vomiting.  Pt would like REFILL on Ergocalciferol.

## 2018-07-09 NOTE — Patient Instructions (Addendum)
  Stop the [facility] the new dose of levothyroxine 6mg and celexa 47mStart macrobid (nitrofurantoin) one tablet by mouth twice a day for seven days Return to clinic in 3 weeks for recheck on symptoms Stay home and do not work from today 07/09/2018 until Monday     If you have lab work done today you will be contacted with your lab results within the next 2 weeks.  If you have not heard from [place] please contact us[place]oreaThe fastest way to get your results is to register for My Chart.   IF you received an x-ray today, you will receive an [facility]. Please contact [facility] at [phone] questions or concerns regarding your invoice.   IF you received labwork today, you will receive an invoice from [facility] at [phone] questions or concerns regarding your invoice.   Our billing staff will not be able to assist you with questions regarding bills from these companies.  You will be contacted with the lab results as soon as they are available. The fastest way to get your results is to activate your My Chart account. Instructions are located on the last page of this paperwork. If you have not heard from us[place]arding the results in 2 weeks, please contact this office.

## 2018-07-11 ENCOUNTER — Encounter (INDEPENDENT_AMBULATORY_CARE_PROVIDER_SITE_OTHER): Payer: Self-pay | Admitting: Physician Assistant

## 2018-07-11 LAB — URINE CULTURE

## 2018-07-17 ENCOUNTER — Other Ambulatory Visit: Payer: Self-pay

## 2018-07-17 ENCOUNTER — Encounter (INDEPENDENT_AMBULATORY_CARE_PROVIDER_SITE_OTHER): Payer: Self-pay | Admitting: Physician Assistant

## 2018-07-17 ENCOUNTER — Ambulatory Visit (INDEPENDENT_AMBULATORY_CARE_PROVIDER_SITE_OTHER): Payer: BC Managed Care – PPO | Admitting: Physician Assistant

## 2018-07-17 ENCOUNTER — Encounter (INDEPENDENT_AMBULATORY_CARE_PROVIDER_SITE_OTHER): Payer: Self-pay

## 2018-07-17 DIAGNOSIS — F3289 Other specified depressive episodes: Secondary | ICD-10-CM

## 2018-07-17 DIAGNOSIS — I10 Essential (primary) hypertension: Secondary | ICD-10-CM | POA: Diagnosis not present

## 2018-07-17 DIAGNOSIS — E559 Vitamin D deficiency, unspecified: Secondary | ICD-10-CM | POA: Diagnosis not present

## 2018-07-17 DIAGNOSIS — E66813 Obesity, class 3: Secondary | ICD-10-CM

## 2018-07-17 DIAGNOSIS — R7303 Prediabetes: Secondary | ICD-10-CM

## 2018-07-17 DIAGNOSIS — Z6841 Body Mass Index (BMI) 40.0 and over, adult: Secondary | ICD-10-CM

## 2018-07-17 MED ORDER — CHLORTHALIDONE 25 MG PO TABS: 25.0000 mg | ORAL_TABLET | Freq: Every day | ORAL | 0 refills | Status: DC

## 2018-07-17 MED ORDER — BUPROPION HCL ER (SR) 150 MG PO TB12: 150.0000 mg | ORAL_TABLET | Freq: Two times a day (BID) | ORAL | 0 refills | Status: DC

## 2018-07-17 MED ORDER — METFORMIN HCL 500 MG PO TABS: 500.0000 mg | ORAL_TABLET | Freq: Every day | ORAL | 0 refills | Status: DC

## 2018-07-17 MED ORDER — VITAMIN D (ERGOCALCIFEROL) 1.25 MG (50000 UNIT) PO CAPS: ORAL_CAPSULE | ORAL | 0 refills | Status: DC

## 2018-07-17 NOTE — Progress Notes (Signed)
Office: [phone]  /  Fax: [phone] TeleHealth Visit:  Melanie [name] has verbally consented to this TeleHealth visit today. The patient is located at home, the provider is located at the [facility] office. The participants in this visit include the listed provider and patient. The visit was conducted today via Webex.  HPI:   Chief Complaint: OBESITY Melanie [name] is here to discuss her progress with her obesity treatment plan. She is keeping a food journal with 1450-1550 calories and 95 grams of protein daily and is following her eating plan approximately 85% of the time. She states she is exercising 0 minutes 0 times per week. Melanie [name] reports that she believes that she has lost about 2 lbs. She reports eating less throughout the day due to being less active. She has not been journaling consistently but thinks she is reaching her protein goal. We were unable to weigh the patient today for this TeleHealth visit. She feels as if she has lost 2 lbs since her last visit. She has lost 11 lbs since starting treatment with [place].  Vitamin D deficiency Melanie [name] has a diagnosis of Vitamin D deficiency. She is currently taking prescription Vit D and denies nausea, vomiting or muscle weakness.  Depression with emotional eating behaviors Melanie [name] is struggling with emotional eating and using food for comfort to the extent that it is negatively impacting her health. She often snacks when she is not hungry. Melanie [name] sometimes feels she is out of control and then feels guilty that she made poor food choices. She has been working on behavior modification techniques to help reduce her emotional eating and has been somewhat successful. She shows no sign of suicidal or homicidal ideations. She denies emotional eating. BP is normal.  Depression screen [facility] 2/9 06/21/2018 04/06/2018 02/18/2018 01/23/2018 05/23/2017  Decreased Interest 0 0 0 0 1  Down, Depressed, Hopeless 0 0 0 0 1  PHQ - 2 Score 0 0 0 0 2  Altered  sleeping - - - - 2  Tired, decreased energy - - - - 2  Change in appetite - - - - 2  Feeling bad or failure about yourself  - - - - 1  Trouble concentrating - - - - 0  Moving slowly or fidgety/restless - - - - 0  Suicidal thoughts - - - - 0  PHQ-9 Score - - - - 9  Difficult doing work/chores - - - - Not difficult at all   Hypertension Melanie [name] is a 43 y.o. female with hypertension.  Melanie [name] denies chest pain or headache. She is working weight loss to help control her blood pressure with the goal of decreasing her risk of heart attack and stroke. Melanie [name]'s blood pressure was normal at her PCP visit last week.  Pre-Diabetes Melanie [name] has a diagnosis of prediabetes based on her elevated Hgb A1c and was informed this puts her at greater risk of developing diabetes. She is taking metformin currently and continues to work on diet and exercise to decrease risk of diabetes. She denies nausea, vomiting, or diarrhea on metformin. No polyphagia.  ASSESSMENT AND PLAN:  Vitamin D deficiency - Plan: VITAMIN D 25 Hydroxy (Vit-D Deficiency, Fractures), Vitamin D, Ergocalciferol, (DRISDOL) 1.25 MG (50000 UT) CAPS capsule  Essential hypertension - Plan: chlorthalidone (HYGROTON) 25 MG tablet  Prediabetes - Plan: Hemoglobin A1c, Insulin, random, metFORMIN (GLUCOPHAGE) 500 MG tablet  Other depression - with emotional eating - Plan: buPROPion (WELLBUTRIN SR) 150 MG 12 hr tablet  Class 3 severe obesity with serious comorbidity and body mass index (BMI) of 50.0 to 59.9 in adult, unspecified obesity type (Melanie [name])  PLAN:  Vitamin D Deficiency Melanie [name] was informed that low Vitamin D levels contributes to fatigue and are associated with obesity, breast, and colon cancer. She agrees to continue to take prescription Vit D @ 50,000 IU twice weekly #10 with 0 refills and will follow-up for routine testing of Vitamin D, at least 2-3 times per year. She was informed of the risk of over-replacement of Vitamin D and agrees  to not increase her dose unless she discusses this with [place] first. Melanie [name] agrees to follow-up with our clinic in 2 weeks.  Depression with Emotional Eating Behaviors We discussed behavior modification techniques today to help Melanie [name] deal with her emotional eating and depression. She was given a refill on her bupropion #90 and agrees to follow-up with our clinic in 2 weeks.  Hypertension We discussed sodium restriction, working on healthy weight loss, and a regular exercise program as the means to achieve improved blood pressure control. Melanie [name] agreed with this plan and agreed to follow up as directed. We will continue to monitor her blood pressure as well as her progress with the above lifestyle modifications. She was given a refill on her chlorthalidone #90 with 0 refills and will watch for signs of hypotension as she continues her lifestyle modifications. She agrees to follow-up with our clinic in 2 weeks.  Pre-Diabetes Melanie [name] will continue to work on weight loss, exercise, and decreasing simple carbohydrates in her diet to help decrease the risk of diabetes. We dicussed metformin including benefits and risks. She was informed that eating too many simple carbohydrates or too many calories at one sitting increases the likelihood of GI side effects. Melanie [name] is currently taking metformin for now and a prescription was written today for #90 with 0 refills. Melanie [name] agreed to follow up with [place] as directed to monitor her progress.  Obesity Melanie [name] is currently in the action stage of change. As such, her goal is to continue with weight loss efforts.  She has agreed to keep a food journal with 1450-1550 calories and 95 grams of protein daily. Melanie [name] has been instructed to work up to a goal of 150 minutes of combined cardio and strengthening exercise per week for weight loss and overall health benefits. We discussed the following Behavioral Modification Strategies today: work on meal planning, easy cooking plans, and keeping  healthy foods in the home.  Melanie [name] has agreed to follow-up with our clinic in 2 weeks. She was informed of the importance of frequent follow-up visits to maximize her success with intensive lifestyle modifications for her multiple health conditions.  ALLERGIES: Allergies  Allergen Reactions  . Lidocaine Other (See Comments)    Flu like sx's  . Relafen [Nabumetone] Other (See Comments)    migraine  . Celebrex [Celecoxib] Rash  . Sulfa Antibiotics Rash    MEDICATIONS: Current Outpatient Medications on File Prior to Visit  Medication Sig Dispense Refill  . busPIRone (BUSPAR) 15 MG tablet TAKE ONE TABLET BY MOUTH TWICE A DAY OR AS DIRECTED. (Patient taking differently: Take 15 mg by mouth 2 (two) times daily. TAKE ONE TABLET BY MOUTH TWICE A DAY OR AS DIRECTED.) 180 tablet 3  . cetirizine (ZYRTEC) 10 MG tablet Take 10 mg by mouth daily.    . citalopram (CELEXA) 40 MG tablet Take 1 tablet (40 mg total) by mouth daily. 90 tablet 1  . diclofenac (VOLTAREN) 75 MG  EC tablet Take 1 tablet (75 mg total) by mouth 2 (two) times daily. 50 tablet 0  . fluticasone (FLONASE) 50 MCG/ACT nasal spray Place 2 sprays into both nostrils daily. (Patient taking differently: Place 2 sprays into both nostrils daily as needed for allergies. ) 48 g 3  . levothyroxine (SYNTHROID) 88 MCG tablet Take 1 tablet (88 mcg total) by mouth daily. 90 tablet 0  . LORazepam (ATIVAN) 1 MG tablet Take 1 tablet (1 mg total) by mouth 2 (two) times daily as needed for anxiety. 20 tablet 0  . montelukast (SINGULAIR) 10 MG tablet Take 1 tablet (10 mg total) by mouth at bedtime. 30 tablet 11  . Multiple Vitamin (MULTIVITAMIN) tablet Take 1 tablet by mouth daily.    . nitrofurantoin, macrocrystal-monohydrate, (MACROBID) 100 MG capsule Take 1 capsule (100 mg total) by mouth 2 (two) times daily. 14 capsule 0  . Omega-3 Fatty Acids (FISH OIL PO) Take 1 capsule by mouth 3 (three) times daily.      Current Facility-Administered Medications on  File Prior to Visit  Medication Dose Route Frequency Provider Last Rate Last Dose  . cefTRIAXone (ROCEPHIN) injection 1 g  1 g Intramuscular Once Rutherford Guys, MD        PAST MEDICAL HISTORY: Past Medical History:  Diagnosis Date  . Allergy   . Anemia   . Anxiety   . Back pain   . Constipation   . Depression   . Fatty liver   . GERD (gastroesophageal reflux disease)   . Hyperlipidemia   . Infertility, female   . Joint pain   . Kidney problem   . Membranous nephrosis   . Multiple food allergies   . Muscle pain   . Numbness and tingling in both hands   . Pre-diabetes   . Shortness of breath   . Sleep apnea   . Stress   . Swelling of both lower extremities     PAST SURGICAL HISTORY: Past Surgical History:  Procedure Laterality Date  . FINGER SURGERY Left     index finger  . KNEE SURGERY Left   . LIVER BIOPSY N/A 05/15/2014   Procedure: LIVER BIOPSY;  Surgeon: Inda Castle, MD;  Location: [facility];  Service: Endoscopy;  Laterality: N/A;  ultrasound to mark the liver  . TONSILLECTOMY AND ADENOIDECTOMY  1978  . TYMPANOSTOMY TUBE PLACEMENT    . wrist cyst Left   . WRIST SURGERY     left - cyst removed    SOCIAL HISTORY: Social History   Tobacco Use  . Smoking status: Never Smoker  . Smokeless tobacco: Never Used  Substance Use Topics  . Alcohol use: No    Alcohol/week: 0.0 standard drinks  . Drug use: No    FAMILY HISTORY: Family History  Problem Relation Age of Onset  . Diabetes Mother   . Liver disease Mother        ESLD- unknown etiology  . Obesity Mother   . Diabetes Maternal Grandmother   . Diabetes Paternal Grandmother   . Breast cancer Paternal Grandmother   . Stroke Maternal Grandfather   . Hyperlipidemia Father    ROS: Review of Systems  Cardiovascular: Negative for chest pain.  Gastrointestinal: Negative for nausea and vomiting.  Musculoskeletal:       Negative for muscle weakness.  Neurological: Negative for headaches.   Endo/Heme/Allergies:       Negative for polyphagia.  Psychiatric/Behavioral: Positive for depression (emotional eating). Negative for suicidal ideas.  Negative for homicidal ideas.   PHYSICAL EXAM: Pt in no acute distress  RECENT LABS AND TESTS: BMET    Component Value Date/Time   NA 142 06/27/2018 1139   K 3.9 06/27/2018 1139   CL 99 06/27/2018 1139   CO2 25 06/27/2018 1139   GLUCOSE 127 (H) 06/27/2018 1139   GLUCOSE 118 (H) 09/08/2015 1514   BUN 16 06/27/2018 1139   CREATININE 0.90 06/27/2018 1139   CREATININE 0.85 09/08/2015 1514   CALCIUM 10.9 (H) 06/27/2018 1139   GFRNONAA 78 06/27/2018 1139   GFRNONAA 85 09/08/2015 1514   GFRAA 90 06/27/2018 1139   GFRAA >89 09/08/2015 1514   Lab Results  Component Value Date   HGBA1C 5.5 05/01/2018   HGBA1C 5.6 12/13/2017   HGBA1C 5.0 09/05/2017   HGBA1C 5.8 (H) 05/23/2017   HGBA1C 5.7 04/12/2017   Lab Results  Component Value Date   INSULIN 30.4 (H) 05/01/2018   INSULIN 39.0 (H) 12/13/2017   INSULIN 24.3 09/05/2017   INSULIN 30.4 (H) 05/23/2017   CBC    Component Value Date/Time   WBC 7.2 05/23/2017 1249   WBC 8.3 08/27/2015 1633   RBC 4.66 05/23/2017 1249   RBC 4.27 08/27/2015 1633   HGB 14.7 05/23/2017 1249   HCT 43.3 05/23/2017 1249   PLT 213 04/05/2017 0947   MCV 93 05/23/2017 1249   MCH 31.5 05/23/2017 1249   MCH 31.1 08/27/2015 1633   MCHC 33.9 05/23/2017 1249   MCHC 34.3 08/27/2015 1633   RDW 13.6 05/23/2017 1249   LYMPHSABS 2.8 05/23/2017 1249   MONOABS 0.4 10/27/2013 1604   EOSABS 0.3 05/23/2017 1249   BASOSABS 0.0 05/23/2017 1249   Iron/TIBC/Ferritin/ %Sat    Component Value Date/Time   IRON 95 06/27/2013 1645   TIBC 371 06/27/2013 1645   FERRITIN 401 (H) 06/27/2013 1645   IRONPCTSAT 26 06/27/2013 1645   Lipid Panel     Component Value Date/Time   CHOL 389 (H) 06/27/2018 1139   TRIG 209 (H) 06/27/2018 1139   HDL 61 06/27/2018 1139   CHOLHDL 6.4 (H) 06/27/2018 1139   CHOLHDL 5.0  06/06/2013 1654   VLDL 27 06/06/2013 1654   LDLCALC 286 (H) 06/27/2018 1139   Hepatic Function Panel     Component Value Date/Time   PROT 6.1 06/27/2018 1139   ALBUMIN 3.9 06/27/2018 1139   AST 35 06/27/2018 1139   ALT 59 (H) 06/27/2018 1139   ALKPHOS 132 (H) 06/27/2018 1139   BILITOT 0.9 06/27/2018 1139   BILIDIR 0.2 04/07/2015 1349   IBILI 1.0 04/07/2015 1349      Component Value Date/Time   TSH 6.170 (H) 06/27/2018 1139   TSH 5.010 (H) 05/01/2018 1031   TSH 5.570 (H) 12/13/2017 1014   Results for JAALIYAH, LUCATERO (MRN [id number]) as of 07/17/2018 13:22  Ref. Range 05/01/2018 10:31  Vitamin D, 25-Hydroxy Latest Ref Range: 30.0 - 100.0 ng/mL 36.0   I, Michaelene Song, am acting as Location manager for [employer], PA-C I, Abby Potash, PA-C have reviewed above note and agree with its content

## 2018-07-22 ENCOUNTER — Other Ambulatory Visit: Payer: Self-pay | Admitting: Emergency Medicine

## 2018-07-22 NOTE — Telephone Encounter (Signed)
Requested medication (s) are due for refill today: yes  Requested medication (s) are on the active medication list:yes  Last refill: 02/04/2018  #50 0 refills  Future visit scheduled yes 07/30/2018  Dr. Nolon Rod  Notes to clinic:Historical Provider  Requested Prescriptions  Pending Prescriptions Disp Refills   diclofenac (VOLTAREN) 75 MG EC tablet [Pharmacy Med Name: DICLOFENAC SOD DR 75 MG TAB] 20 tablet 0    Sig: TAKE 1 TABLET BY MOUTH TWO TIMES A DAY FOR 5 DAYS. AFTER 5 DAYS TAKE AS NEEDED     Analgesics:  NSAIDS Failed - 07/22/2018  5:37 PM      Failed - HGB in normal range and within 360 days    Hemoglobin  Date Value Ref Range Status  05/23/2017 14.7 11.1 - 15.9 g/dL Final         Passed - Cr in normal range and within 360 days    Creat  Date Value Ref Range Status  09/08/2015 0.85 0.50 - 1.10 mg/dL Final   Creatinine, Ser  Date Value Ref Range Status  06/27/2018 0.90 0.57 - 1.00 mg/dL Final         Passed - Patient is not pregnant      Passed - Valid encounter within last 12 months    Recent Outpatient Visits          3 weeks ago Statin intolerance   [facility] at [facility], Arlie Solomons, MD   1 month ago Statin intolerance   [facility] Care at [facility], Arlie Solomons, MD   3 months ago Statin intolerance   [facility] Care at [facility], Arlie Solomons, MD   5 months ago Right lateral epicondylitis   Primary Care at [facility], Arlie Solomons, MD   6 months ago Chronic pain of both feet   Primary Care at [facility], Ines Bloomer, MD

## 2018-07-23 ENCOUNTER — Encounter: Payer: Self-pay | Admitting: Family Medicine

## 2018-07-26 ENCOUNTER — Telehealth (INDEPENDENT_AMBULATORY_CARE_PROVIDER_SITE_OTHER): Payer: BC Managed Care – PPO | Admitting: Family Medicine

## 2018-07-26 ENCOUNTER — Other Ambulatory Visit: Payer: Self-pay

## 2018-07-26 ENCOUNTER — Ambulatory Visit: Payer: BC Managed Care – PPO

## 2018-07-26 DIAGNOSIS — R42 Dizziness and giddiness: Secondary | ICD-10-CM

## 2018-07-26 DIAGNOSIS — R5383 Other fatigue: Secondary | ICD-10-CM

## 2018-07-26 DIAGNOSIS — R5381 Other malaise: Secondary | ICD-10-CM

## 2018-07-26 DIAGNOSIS — E785 Hyperlipidemia, unspecified: Secondary | ICD-10-CM

## 2018-07-26 DIAGNOSIS — R251 Tremor, unspecified: Secondary | ICD-10-CM

## 2018-07-26 DIAGNOSIS — R519 Headache, unspecified: Secondary | ICD-10-CM

## 2018-07-26 LAB — VITAMIN D 25 HYDROXY (VIT D DEFICIENCY, FRACTURES): Vit D, 25-Hydroxy: 37 ng/mL (ref 30.0–100.0)

## 2018-07-26 LAB — INSULIN, RANDOM: INSULIN: 39.2 u[IU]/mL — ABNORMAL HIGH (ref 2.6–24.9)

## 2018-07-26 LAB — HEMOGLOBIN A1C
Est. average glucose Bld gHb Est-mCnc: 111 mg/dL
Hgb A1c MFr Bld: 5.5 % (ref 4.8–5.6)

## 2018-07-26 MED ORDER — MECLIZINE HCL 25 MG PO TABS: 25.0000 mg | ORAL_TABLET | Freq: Three times a day (TID) | ORAL | 0 refills | Status: DC | PRN

## 2018-07-26 NOTE — Progress Notes (Signed)
Virtual Visit via Video Note  I connected with Melanie [name] on 07/26/18 at 12:11 PM by a video enabled telemedicine application Webex and verified that I am speaking with the correct person using two identifiers.   I discussed the limitations, risks, security and privacy concerns of performing an evaluation and management service by telephone and the availability of in person appointments. I also discussed with the patient that there may be a patient responsible charge related to this service. The patient expressed understanding and agreed to proceed, consent obtained  Chief complaint: Follow-up of shortness of breath, chest pain, lightheadedness, dizziness, fatigue, sore throat.  History of Present Illness: Melanie [name] is a 43 y.o. female Most recently evaluated by Dr. Nolon Rod on April 28.  Intermittent chest tightness for a few days for 30 to 45 minutes at that time, short of breath during those times, with associated lightheadedness and occasional shortness of breath or fatigue for a few weeks.  Was having a sore throat for 4 to 5 days that was worsening but no fevers.  Was having some headache for 3 weeks.   Thought may be due to welchol, so that was discontinued.  Started Macrobid for E coli UTI - finished abx.  New dose of higher dose of Synthroid to 88 mcg (TSH 6.7 on 4/16) since 4/29 Increased celexa  From 20 to 40 mg since 4/29.  She was seen by weight management on 5/6 and continued on Wellbutrin.  Today, still feels similar. Still feels tired, general malaise. Just doesn't feel well at times.   HA's have continued, still lightheaded - more frequent. Now with vertigo symptoms with room spinning - past approximately 10 days. Last had vertigo 10 years ago - underwent vestibular rehab. Still has to be cautious with balance. No treatments for vertigo.  No focal weakness, no slurred speech. Not worst HA of life.  Occasional tremors in left hand - past year.  Has had frequent HA's for  some time. No neurologist.  Persistent HA currently.  Possible slight hum/ring at times when quiet. No other tinnitus, no hearing loss.  No dark stools, no blood in stools, no hematuria or other bleeding. Urinating normally.    No further chest pains. No fever. Sore throat at times - intermittent for a few days, scratchy throat for a few days - none past few days.  No known sick contacts.  Hx of seasonal allergies. Continued singulair, zyrtec, flonase.   Feels like managing stress/anxiety ok.  Has a child who has been hospitalized due to mental illness. Ongoing stressors, but denies worsening anxiety/depression symptoms.  5 children - 4 sons with Autism and ADHD and dtr with mental illness.   Hypercalcemia - 10.9  Last month - up from 10.3 3 months ago.  Not on calcium supplements.   EKG SR, wnl on 4/28. Most recent HGB nl in March.  Lab Results  Component Value Date   WBC 7.2 05/23/2017   HGB 14.7 05/23/2017   HCT 43.3 05/23/2017   MCV 93 05/23/2017   PLT 213 04/05/2017      Patient Active Problem List   Diagnosis Date Noted  . Chronic pain of both feet 01/23/2018  . Bilateral calcaneal spurs 01/23/2018  . Other fatigue 05/23/2017  . Shortness of breath on exertion 05/23/2017  . Prediabetes 05/23/2017  . Liver fibrosis 05/23/2017  . Hyperlipidemia 04/16/2017  . OSA (obstructive sleep apnea) 11/23/2015  . NASH (nonalcoholic steatohepatitis) 08/27/2015  . Dependent edema 08/27/2015  .  BMI 40.0-44.9, adult ([place]) 03/15/2015  . Hepatic fibrosis 06/08/2014  . Depression with anxiety 06/06/2013  . Perennial allergic rhinitis 06/06/2013  . Infertility associated with anovulation 06/06/2013   Past Medical History:  Diagnosis Date  . Allergy   . Anemia   . Anxiety   . Back pain   . Constipation   . Depression   . Fatty liver   . GERD (gastroesophageal reflux disease)   . Hyperlipidemia   . Infertility, female   . Joint pain   . Kidney problem   . Membranous  nephrosis   . Multiple food allergies   . Muscle pain   . Numbness and tingling in both hands   . Pre-diabetes   . Shortness of breath   . Sleep apnea   . Stress   . Swelling of both lower extremities    Past Surgical History:  Procedure Laterality Date  . FINGER SURGERY Left     index finger  . KNEE SURGERY Left   . LIVER BIOPSY N/A 05/15/2014   Procedure: LIVER BIOPSY;  Surgeon: Inda Castle, MD;  Location: [facility];  Service: Endoscopy;  Laterality: N/A;  ultrasound to mark the liver  . TONSILLECTOMY AND ADENOIDECTOMY  1978  . TYMPANOSTOMY TUBE PLACEMENT    . wrist cyst Left   . WRIST SURGERY     left - cyst removed   Allergies  Allergen Reactions  . Lidocaine Other (See Comments)    Flu like sx's  . Relafen [Nabumetone] Other (See Comments)    migraine  . Celebrex [Celecoxib] Rash  . Sulfa Antibiotics Rash   Prior to Admission medications   Medication Sig Start Date End Date Taking? Authorizing Provider  buPROPion (WELLBUTRIN SR) 150 MG 12 hr tablet Take 1 tablet (150 mg total) by mouth 2 (two) times daily. 07/17/18  Yes Abby Potash, PA-C  busPIRone (BUSPAR) 15 MG tablet TAKE ONE TABLET BY MOUTH TWICE A DAY OR AS DIRECTED. Patient taking differently: Take 15 mg by mouth 2 (two) times daily. TAKE ONE TABLET BY MOUTH TWICE A DAY OR AS DIRECTED. 04/06/18  Yes Stallings, Zoe A, MD  cetirizine (ZYRTEC) 10 MG tablet Take 10 mg by mouth daily.   Yes [provider]  chlorthalidone (HYGROTON) 25 MG tablet Take 1 tablet (25 mg total) by mouth daily. 07/17/18  Yes Abby Potash, PA-C  citalopram (CELEXA) 40 MG tablet Take 1 tablet (40 mg total) by mouth daily. 07/09/18  Yes Stallings, Zoe A, MD  fluticasone (FLONASE) 50 MCG/ACT nasal spray Place 2 sprays into both nostrils daily. Patient taking differently: Place 2 sprays into both nostrils daily as needed for allergies.  03/30/17  Yes Harrison Mons, PA  levothyroxine (SYNTHROID) 88 MCG tablet Take 1 tablet (88 mcg  total) by mouth daily. 07/09/18  Yes Stallings, Zoe A, MD  LORazepam (ATIVAN) 1 MG tablet Take 1 tablet (1 mg total) by mouth 2 (two) times daily as needed for anxiety. 04/06/18  Yes Forrest Moron, MD  metFORMIN (GLUCOPHAGE) 500 MG tablet Take 1 tablet (500 mg total) by mouth daily with breakfast. 07/17/18  Yes Abby Potash, PA-C  montelukast (SINGULAIR) 10 MG tablet Take 1 tablet (10 mg total) by mouth at bedtime. 06/11/18  Yes Forrest Moron, MD  Multiple Vitamin (MULTIVITAMIN) tablet Take 1 tablet by mouth daily.   Yes [provider]  Omega-3 Fatty Acids (FISH OIL PO) Take 1 capsule by mouth 3 (three) times daily.    Yes [provider]  Vitamin D, Ergocalciferol, (DRISDOL) 1.25 MG (50000 UT) CAPS capsule TAKE ONE CAPSULE BY MOUTH TWICE WEEKLY 07/17/18  Yes Abby Potash, PA-C  meloxicam (MOBIC) 15 MG tablet  07/23/18   [provider]   Social History   Socioeconomic History  . Marital status: Married    Spouse name: [name]  . Number of children: 5  . Years of education: in [place] program  . Highest education level: Bachelor's degree (e.g., BA, AB, BS)  Occupational History  . Occupation: Product manager: [employer]    Comment: ESL  Social Needs  . Financial resource strain: Not on file  . Food insecurity:    Worry: Not on file    Inability: Not on file  . Transportation needs:    Medical: Not on file    Non-medical: Not on file  Tobacco Use  . Smoking status: Never Smoker  . Smokeless tobacco: Never Used  Substance and Sexual Activity  . Alcohol use: No    Alcohol/week: 0.0 standard drinks  . Drug use: No  . Sexual activity: Yes    Partners: Male    Birth control/protection: Post-menopausal  Lifestyle  . Physical activity:    Days per week: Not on file    Minutes per session: Not on file  . Stress: Not on file  Relationships  . Social connections:    Talks on phone: Not on file    Gets together: Not on file    Attends  religious service: Not on file    Active member of club or organization: Not on file    Attends meetings of clubs or organizations: Not on file    Relationship status: Not on file  . Intimate partner violence:    Fear of current or ex partner: Not on file    Emotionally abused: Not on file    Physically abused: Not on file    Forced sexual activity: Not on file  Other Topics Concern  . Not on file  Social History Narrative   Lives with her husband and their 5 adpoted children.    Adopted 2 sets of siblings, all with psychiatric diagnoses (ADHD, Asperger's, ODD, PTSD, depression, bipolar disorder).   Her husband has Asperger's, and works in disability services.   Occasionally drinks coke or pepsi     Observations/Objective: No distress, nontoxic.  HEENT: Pupils equal, EOMI, but increased dizziness and head symptoms/nausea with eye movement. No visible nystagmus on video. Neuro: No facial droop, equal movements, tongue protrudes midline. No pronator drift, normal heel slide.   Assessment and Plan: Malaise and fatigue - Plan: CBC, PTH, Intact and Calcium  Hypercalcemia - Plan: Basic metabolic panel, PTH, Intact and Calcium  Nonintractable headache, unspecified chronicity pattern, unspecified headache type - Plan: Ambulatory referral to Neurology  Vertigo - Plan: CBC, Ambulatory referral to Neurology, meclizine (ANTIVERT) 25 MG tablet  Intermittent tremor - Plan: Ambulatory referral to Neurology  Lightheadedness - Plan: CBC  Possible multiple causes of above symptoms, including vertigo, volume depletion, situational stressors but denies recent change in stress, but also had prior hypercalcemia.  No concerning focal neurologic findings over video assessment.  -Meclizine as needed for vertigo symptoms, start if worsening/side effects  -Check labs including calcium, anemia screen  -Refer to neurology to evaluate symptoms along with ongoing headache, dizziness and reported episodic  tremors in left hand  -Follow-up as planned with PCP following week.  ER/RTC precautions discussed if acute worsening  Follow Up  Instructions:  Patient Instructions  As we discussed there may be multiple causes of your symptoms.  For vertigo symptoms, can try meclizine, but if that makes you feel worse than stop that med.  Make sure to drink plenty of fluids, regular meals.  Lab visit today to recheck your calcium that was slightly increased last time.  Additionally will check an anemia test.  I will refer you to neurology to evaluate the ongoing headache, dizziness, vertigo, and the episodic tremor in your left hand.  Continue follow-up as planned with Dr. Nolon Rod next week but if anything acutely worsens over the weekend, present to urgent care or emergency room.  Please let me know if there are questions.   Vertigo Vertigo is the feeling that you or your surroundings are moving when they are not. Vertigo can be dangerous if it occurs while you are doing something that could endanger you or others, such as driving. What are the causes? This condition is caused by a disturbance in the signals that are sent by your body's sensory systems to your brain. Different causes of a disturbance can lead to vertigo, including:  Infections, especially in the inner ear.  A bad reaction to a drug, or misuse of alcohol and medicines.  Withdrawal from drugs or alcohol.  Quickly changing positions, as when lying down or rolling over in bed.  Migraine headaches.  Decreased blood flow to the brain.  Decreased blood pressure.  Increased pressure in the brain from a head or neck injury, stroke, infection, tumor, or bleeding.  Central nervous system disorders. What are the signs or symptoms? Symptoms of this condition usually occur when you move your head or your eyes in different directions. Symptoms may start suddenly, and they usually last for less than a minute. Symptoms may include:  Loss of  balance and falling.  Feeling like you are spinning or moving.  Feeling like your surroundings are spinning or moving.  Nausea and vomiting.  Blurred vision or double vision.  Difficulty hearing.  Slurred speech.  Dizziness.  Involuntary eye movement (nystagmus). Symptoms can be mild and cause only slight annoyance, or they can be severe and interfere with daily life. Episodes of vertigo may return (recur) over time, and they are often triggered by certain movements. Symptoms may improve over time. How is this diagnosed? This condition may be diagnosed based on medical history and the quality of your nystagmus. Your health care provider may test your eye movements by asking you to quickly change positions to trigger the nystagmus. This may be called the Dix-Hallpike test, head thrust test, or roll test. You may be referred to a health care provider who specializes in ear, nose, and throat (ENT) problems (otolaryngologist) or a provider who specializes in disorders of the central nervous system (neurologist). You may have additional testing, including:  A physical exam.  Blood tests.  MRI.  A CT scan.  An electrocardiogram (ECG). This records electrical activity in your heart.  An electroencephalogram (EEG). This records electrical activity in your brain.  Hearing tests. How is this treated? Treatment for this condition depends on the cause and the severity of the symptoms. Treatment options include:  Medicines to treat nausea or vertigo. These are usually used for severe cases. Some medicines that are used to treat other conditions may also reduce or eliminate vertigo symptoms. These include: ? Medicines that control allergies (antihistamines). ? Medicines that control seizures (anticonvulsants). ? Medicines that relieve depression (antidepressants). ? Medicines that relieve anxiety (  sedatives).  Head movements to adjust your inner ear back to normal. If your vertigo is  caused by an ear problem, your health care provider may recommend certain movements to correct the problem.  Surgery. This is rare. Follow these instructions at home: Safety  Move slowly.Avoid sudden body or head movements.  Avoid driving.  Avoid operating heavy machinery.  Avoid doing any tasks that would cause danger to you or others if you would have a vertigo episode during the task.  If you have trouble walking or keeping your balance, try using a cane for stability. If you feel dizzy or unstable, sit down right away.  Return to your normal activities as told by your health care provider. Ask your health care provider what activities are safe for you. General instructions  Take over-the-counter and prescription medicines only as told by your health care provider.  Avoid certain positions or movements as told by your health care provider.  Drink enough fluid to keep your urine clear or pale yellow.  Keep all follow-up visits as told by your health care provider. This is important. Contact a health care provider if:  Your medicines do not relieve your vertigo or they make it worse.  You have a fever.  Your condition gets worse or you develop new symptoms.  Your family or friends notice any behavioral changes.  Your nausea or vomiting gets worse.  You have numbness or a "pins and needles" sensation in part of your body. Get help right away if:  You have difficulty moving or speaking.  You are always dizzy.  You faint.  You develop severe headaches.  You have weakness in your hands, arms, or legs.  You have changes in your hearing or vision.  You develop a stiff neck.  You develop sensitivity to light. This information is not intended to replace advice given to you by your health care provider. Make sure you discuss any questions you have with your health care provider. Document Released: 12/07/2004 Document Revised: 08/11/2015 Document Reviewed: 06/22/2014  Elsevier Interactive Patient Education  2019 [employer].  Fatigue If you have fatigue, you feel tired all the time and have a lack of energy or a lack of motivation. Fatigue may make it difficult to start or complete tasks because of exhaustion. In general, occasional or mild fatigue is often a normal response to activity or life. However, long-lasting (chronic) or extreme fatigue may be a symptom of a medical condition. Follow these instructions at home: General instructions  Watch your fatigue for any changes.  Go to bed and get up at the same time every day.  Avoid fatigue by pacing yourself during the day and getting enough sleep at night.  Maintain a healthy weight. Medicines  Take over-the-counter and prescription medicines only as told by your health care provider.  Take a multivitamin, if told by your health care provider.  Do not use herbal or dietary supplements unless they are approved by your health care provider. Activity   Exercise regularly, as told by your health care provider.  Use or practice techniques to help you relax, such as yoga, tai chi, meditation, or massage therapy. Eating and drinking   Avoid heavy meals in the evening.  Eat a well-balanced diet, which includes lean proteins, whole grains, plenty of fruits and vegetables, and low-fat dairy products.  Avoid consuming too much caffeine.  Avoid the use of alcohol.  Drink enough fluid to keep your urine pale yellow. Lifestyle  Change situations  that cause you stress. Try to keep your work and personal schedule in balance.  Do not use any products that contain nicotine or tobacco, such as cigarettes and e-cigarettes. If you need help quitting, ask your health care provider.  Do not use drugs. Contact a health care provider if:  Your fatigue does not get better.  You have a fever.  You suddenly lose or gain weight.  You have headaches.  You have trouble falling asleep or sleeping  through the night.  You feel angry, guilty, anxious, or sad.  You are unable to have a bowel movement (constipation).  Your skin is dry.  You have swelling in your legs or another part of your body. Get help right away if:  You feel confused.  Your vision is blurry.  You feel faint or you pass out.  You have a severe headache.  You have severe pain in your abdomen, your back, or the area between your waist and hips (pelvis).  You have chest pain, shortness of breath, or an irregular or fast heartbeat.  You are unable to urinate, or you urinate less than normal.  You have abnormal bleeding, such as bleeding from the rectum, vagina, nose, lungs, or nipples.  You vomit blood.  You have thoughts about hurting yourself or others. If you ever feel like you may hurt yourself or others, or have thoughts about taking your own life, get help right away. You can go to your nearest emergency department or call:  Your local emergency services (911 in the [place]  A suicide crisis helpline, such as the [place] at 9404213990. This is open 24 hours a day. Summary  If you have fatigue, you feel tired all the time and have a lack of energy or a lack of motivation.  Fatigue may make it difficult to start or complete tasks because of exhaustion.  Long-lasting (chronic) or extreme fatigue may be a symptom of a medical condition.  Exercise regularly, as told by your health care provider.  Change situations that cause you stress. Try to keep your work and personal schedule in balance. This information is not intended to replace advice given to you by your health care provider. Make sure you discuss any questions you have with your health care provider. Document Released: 12/25/2006 Document Revised: 11/22/2016 Document Reviewed: 11/22/2016 Elsevier Interactive Patient Education  2019 [employer].      I discussed the assessment and treatment plan with  the patient. The patient was provided an opportunity to ask questions and all were answered. The patient agreed with the plan and demonstrated an understanding of the instructions.   The patient was advised to call back or seek an in-person evaluation if the symptoms worsen or if the condition fails to improve as anticipated.  I provided 30 minutes of non-face-to-face time during this encounter.   Wendie Agreste, MD

## 2018-07-26 NOTE — Progress Notes (Signed)
2 week F/u   ~SOB is better  ~Light headed, dizziness & fatigue has worsen ~Sore Throat off and on.   ~No chest pain.   Overall not feeling much better (going on for about 3 wks)

## 2018-07-26 NOTE — Patient Instructions (Signed)
As we discussed there may be multiple causes of your symptoms.  For vertigo symptoms, can try meclizine, but if that makes you feel worse than stop that med.  Make sure to drink plenty of fluids, regular meals.  Lab visit today to recheck your calcium that was slightly increased last time.  Additionally will check an anemia test.  I will refer you to neurology to evaluate the ongoing headache, dizziness, vertigo, and the episodic tremor in your left hand.  Continue follow-up as planned with Dr. Nolon Rod next week but if anything acutely worsens over the weekend, present to urgent care or emergency room.  Please let me know if there are questions.   Vertigo Vertigo is the feeling that you or your surroundings are moving when they are not. Vertigo can be dangerous if it occurs while you are doing something that could endanger you or others, such as driving. What are the causes? This condition is caused by a disturbance in the signals that are sent by your body's sensory systems to your brain. Different causes of a disturbance can lead to vertigo, including:  Infections, especially in the inner ear.  A bad reaction to a drug, or misuse of alcohol and medicines.  Withdrawal from drugs or alcohol.  Quickly changing positions, as when lying down or rolling over in bed.  Migraine headaches.  Decreased blood flow to the brain.  Decreased blood pressure.  Increased pressure in the brain from a head or neck injury, stroke, infection, tumor, or bleeding.  Central nervous system disorders. What are the signs or symptoms? Symptoms of this condition usually occur when you move your head or your eyes in different directions. Symptoms may start suddenly, and they usually last for less than a minute. Symptoms may include:  Loss of balance and falling.  Feeling like you are spinning or moving.  Feeling like your surroundings are spinning or moving.  Nausea and vomiting.  Blurred vision or double  vision.  Difficulty hearing.  Slurred speech.  Dizziness.  Involuntary eye movement (nystagmus). Symptoms can be mild and cause only slight annoyance, or they can be severe and interfere with daily life. Episodes of vertigo may return (recur) over time, and they are often triggered by certain movements. Symptoms may improve over time. How is this diagnosed? This condition may be diagnosed based on medical history and the quality of your nystagmus. Your health care provider may test your eye movements by asking you to quickly change positions to trigger the nystagmus. This may be called the Dix-Hallpike test, head thrust test, or roll test. You may be referred to a health care provider who specializes in ear, nose, and throat (ENT) problems (otolaryngologist) or a provider who specializes in disorders of the central nervous system (neurologist). You may have additional testing, including:  A physical exam.  Blood tests.  MRI.  A CT scan.  An electrocardiogram (ECG). This records electrical activity in your heart.  An electroencephalogram (EEG). This records electrical activity in your brain.  Hearing tests. How is this treated? Treatment for this condition depends on the cause and the severity of the symptoms. Treatment options include:  Medicines to treat nausea or vertigo. These are usually used for severe cases. Some medicines that are used to treat other conditions may also reduce or eliminate vertigo symptoms. These include: ? Medicines that control allergies (antihistamines). ? Medicines that control seizures (anticonvulsants). ? Medicines that relieve depression (antidepressants). ? Medicines that relieve anxiety (sedatives).  Head movements to  adjust your inner ear back to normal. If your vertigo is caused by an ear problem, your health care provider may recommend certain movements to correct the problem.  Surgery. This is rare. Follow these instructions at home: Safety   Move slowly.Avoid sudden body or head movements.  Avoid driving.  Avoid operating heavy machinery.  Avoid doing any tasks that would cause danger to you or others if you would have a vertigo episode during the task.  If you have trouble walking or keeping your balance, try using a cane for stability. If you feel dizzy or unstable, sit down right away.  Return to your normal activities as told by your health care provider. Ask your health care provider what activities are safe for you. General instructions  Take over-the-counter and prescription medicines only as told by your health care provider.  Avoid certain positions or movements as told by your health care provider.  Drink enough fluid to keep your urine clear or pale yellow.  Keep all follow-up visits as told by your health care provider. This is important. Contact a health care provider if:  Your medicines do not relieve your vertigo or they make it worse.  You have a fever.  Your condition gets worse or you develop new symptoms.  Your family or friends notice any behavioral changes.  Your nausea or vomiting gets worse.  You have numbness or a "pins and needles" sensation in part of your body. Get help right away if:  You have difficulty moving or speaking.  You are always dizzy.  You faint.  You develop severe headaches.  You have weakness in your hands, arms, or legs.  You have changes in your hearing or vision.  You develop a stiff neck.  You develop sensitivity to light. This information is not intended to replace advice given to you by your health care provider. Make sure you discuss any questions you have with your health care provider. Document Released: 12/07/2004 Document Revised: 08/11/2015 Document Reviewed: 06/22/2014 Elsevier Interactive Patient Education  2019 [employer].  Fatigue If you have fatigue, you feel tired all the time and have a lack of energy or a lack of motivation.  Fatigue may make it difficult to start or complete tasks because of exhaustion. In general, occasional or mild fatigue is often a normal response to activity or life. However, long-lasting (chronic) or extreme fatigue may be a symptom of a medical condition. Follow these instructions at home: General instructions  Watch your fatigue for any changes.  Go to bed and get up at the same time every day.  Avoid fatigue by pacing yourself during the day and getting enough sleep at night.  Maintain a healthy weight. Medicines  Take over-the-counter and prescription medicines only as told by your health care provider.  Take a multivitamin, if told by your health care provider.  Do not use herbal or dietary supplements unless they are approved by your health care provider. Activity   Exercise regularly, as told by your health care provider.  Use or practice techniques to help you relax, such as yoga, tai chi, meditation, or massage therapy. Eating and drinking   Avoid heavy meals in the evening.  Eat a well-balanced diet, which includes lean proteins, whole grains, plenty of fruits and vegetables, and low-fat dairy products.  Avoid consuming too much caffeine.  Avoid the use of alcohol.  Drink enough fluid to keep your urine pale yellow. Lifestyle  Change situations that cause you stress. Try  to keep your work and personal schedule in balance.  Do not use any products that contain nicotine or tobacco, such as cigarettes and e-cigarettes. If you need help quitting, ask your health care provider.  Do not use drugs. Contact a health care provider if:  Your fatigue does not get better.  You have a fever.  You suddenly lose or gain weight.  You have headaches.  You have trouble falling asleep or sleeping through the night.  You feel angry, guilty, anxious, or sad.  You are unable to have a bowel movement (constipation).  Your skin is dry.  You have swelling in your legs  or another part of your body. Get help right away if:  You feel confused.  Your vision is blurry.  You feel faint or you pass out.  You have a severe headache.  You have severe pain in your abdomen, your back, or the area between your waist and hips (pelvis).  You have chest pain, shortness of breath, or an irregular or fast heartbeat.  You are unable to urinate, or you urinate less than normal.  You have abnormal bleeding, such as bleeding from the rectum, vagina, nose, lungs, or nipples.  You vomit blood.  You have thoughts about hurting yourself or others. If you ever feel like you may hurt yourself or others, or have thoughts about taking your own life, get help right away. You can go to your nearest emergency department or call:  Your local emergency services (911 in the [place]  A suicide crisis helpline, such as the [facility] at 909-050-5088. This is open 24 hours a day. Summary  If you have fatigue, you feel tired all the time and have a lack of energy or a lack of motivation.  Fatigue may make it difficult to start or complete tasks because of exhaustion.  Long-lasting (chronic) or extreme fatigue may be a symptom of a medical condition.  Exercise regularly, as told by your health care provider.  Change situations that cause you stress. Try to keep your work and personal schedule in balance. This information is not intended to replace advice given to you by your health care provider. Make sure you discuss any questions you have with your health care provider. Document Released: 12/25/2006 Document Revised: 11/22/2016 Document Reviewed: 11/22/2016 Elsevier Interactive Patient Education  [employer].

## 2018-07-27 LAB — COMPREHENSIVE METABOLIC PANEL
ALT: 56 IU/L — ABNORMAL HIGH (ref 0–32)
AST: 35 IU/L (ref 0–40)
Albumin/Globulin Ratio: 1.7 (ref 1.2–2.2)
Albumin: 4 g/dL (ref 3.8–4.8)
Alkaline Phosphatase: 137 IU/L — ABNORMAL HIGH (ref 39–117)
BUN/Creatinine Ratio: 16 (ref 9–23)
BUN: 18 mg/dL (ref 6–24)
Bilirubin Total: 0.8 mg/dL (ref 0.0–1.2)
CO2: 25 mmol/L (ref 20–29)
Calcium: 10.3 mg/dL — ABNORMAL HIGH (ref 8.7–10.2)
Chloride: 97 mmol/L (ref 96–106)
Creatinine, Ser: 1.1 mg/dL — ABNORMAL HIGH (ref 0.57–1.00)
GFR calc Af Amer: 71 mL/min/{1.73_m2} (ref >59)
GFR calc non Af Amer: 61 mL/min/{1.73_m2} (ref >59)
Globulin, Total: 2.3 g/dL (ref 1.5–4.5)
Glucose: 131 mg/dL — ABNORMAL HIGH (ref 65–99)
Potassium: 3.9 mmol/L (ref 3.5–5.2)
Sodium: 136 mmol/L (ref 134–144)
Total Protein: 6.3 g/dL (ref 6.0–8.5)

## 2018-07-27 LAB — CBC
Hematocrit: 41.6 % (ref 34.0–46.6)
Hemoglobin: 14.6 g/dL (ref 11.1–15.9)
MCH: 31.7 pg (ref 26.6–33.0)
MCHC: 35.1 g/dL (ref 31.5–35.7)
MCV: 90 fL (ref 79–97)
Platelets: 254 10*3/uL (ref 150–450)
RBC: 4.61 x10E6/uL (ref 3.77–5.28)
RDW: 12.8 % (ref 11.7–15.4)
WBC: 8.5 10*3/uL (ref 3.4–10.8)

## 2018-07-27 LAB — LIPID PANEL
Chol/HDL Ratio: 7 ratio — ABNORMAL HIGH (ref 0.0–4.4)
Cholesterol, Total: 398 mg/dL — ABNORMAL HIGH (ref 100–199)
HDL: 57 mg/dL (ref >39)
LDL Calculated: 280 mg/dL — ABNORMAL HIGH (ref 0–99)
Triglycerides: 304 mg/dL — ABNORMAL HIGH (ref 0–149)
VLDL Cholesterol Cal: 61 mg/dL — ABNORMAL HIGH (ref 5–40)

## 2018-07-27 LAB — PTH, INTACT AND CALCIUM: PTH: 43 pg/mL (ref 15–65)

## 2018-07-30 ENCOUNTER — Other Ambulatory Visit: Payer: Self-pay

## 2018-07-30 ENCOUNTER — Telehealth (INDEPENDENT_AMBULATORY_CARE_PROVIDER_SITE_OTHER): Payer: BC Managed Care – PPO | Admitting: Family Medicine

## 2018-07-30 DIAGNOSIS — R5381 Other malaise: Secondary | ICD-10-CM

## 2018-07-30 DIAGNOSIS — M7711 Lateral epicondylitis, right elbow: Secondary | ICD-10-CM

## 2018-07-30 DIAGNOSIS — H814 Vertigo of central origin: Secondary | ICD-10-CM | POA: Diagnosis not present

## 2018-07-30 DIAGNOSIS — Z789 Other specified health status: Secondary | ICD-10-CM

## 2018-07-30 DIAGNOSIS — E781 Pure hyperglyceridemia: Secondary | ICD-10-CM

## 2018-07-30 DIAGNOSIS — R519 Headache, unspecified: Secondary | ICD-10-CM

## 2018-07-30 DIAGNOSIS — G5620 Lesion of ulnar nerve, unspecified upper limb: Secondary | ICD-10-CM

## 2018-07-30 DIAGNOSIS — R51 Headache: Secondary | ICD-10-CM

## 2018-07-30 DIAGNOSIS — R5383 Other fatigue: Secondary | ICD-10-CM

## 2018-07-30 DIAGNOSIS — E785 Hyperlipidemia, unspecified: Secondary | ICD-10-CM

## 2018-07-30 MED ORDER — SUMATRIPTAN SUCCINATE 50 MG PO TABS: 50.0000 mg | ORAL_TABLET | ORAL | 0 refills | Status: DC | PRN

## 2018-07-30 NOTE — Progress Notes (Signed)
Telemedicine Encounter- SOAP NOTE Established Patient  This telephone encounter was conducted with the patient's (or proxy's) verbal consent via audio telecommunications: yes/no: Yes Patient was instructed to have this encounter in a suitably private space; and to only have persons present to whom they give permission to participate. In addition, patient identity was confirmed by use of name plus two identifiers (DOB and address).  I discussed the limitations, risks, security and privacy concerns of performing an evaluation and management service by telephone and the availability of in person appointments. I also discussed with the patient that there may be a patient responsible charge related to this service. The patient expressed understanding and agreed to proceed.  I spent a total of TIME; 0 MIN TO 60 MIN: 25 minutes talking with the patient or their proxy.  CC: headache, lightheadedness, fatigue  Subjective   Melanie [name] is a 43 y.o. established patient. Telephone visit today for  HPI  Dizziness and Lightheadedness She reports that 3 days ago she felt dizzy every time she moved The dizziness improved yesterday and she had 3 bouts of dizziness She reports that she was dizzy all day She reports that she feels like she is spinning in a spinning room that she is spinning together with the room  No loss of bowel or bladder No vomiting Ongoing for 5 weeks Improved by laying down and avoiding moving her head  Headache Her last visit with Dr. Carlota Raspberry 07/26/2018 When he headache starts to mount she takes Excedrin which sometimes helps.  She did not take the meclizine due to concern of being too sleepy.  She denies nausea   Malaise and Fatigue Her work up for fatigue with Dr. Carlota Raspberry showed no pth disease, anemia, vitamin D deficiency She is still working from home She denies any depression  Depression screen [facility] 2/9 07/30/2018 07/26/2018 06/21/2018 04/06/2018 02/18/2018  Decreased  Interest 0 0 0 0 0  Down, Depressed, Hopeless 0 0 0 0 0  PHQ - 2 Score 0 0 0 0 0  Altered sleeping - - - - -  Tired, decreased energy - - - - -  Change in appetite - - - - -  Feeling bad or failure about yourself  - - - - -  Trouble concentrating - - - - -  Moving slowly or fidgety/restless - - - - -  Suicidal thoughts - - - - -  PHQ-9 Score - - - - -  Difficult doing work/chores - - - - -     Patient Active Problem List   Diagnosis Date Noted  . Right tennis elbow 07/30/2018  . Cubital tunnel syndrome 07/30/2018  . Chronic pain of both feet 01/23/2018  . Bilateral calcaneal spurs 01/23/2018  . Other fatigue 05/23/2017  . Shortness of breath on exertion 05/23/2017  . Prediabetes 05/23/2017  . Liver fibrosis 05/23/2017  . Hyperlipidemia 04/16/2017  . OSA (obstructive sleep apnea) 11/23/2015  . NASH (nonalcoholic steatohepatitis) 08/27/2015  . Dependent edema 08/27/2015  . BMI 40.0-44.9, adult ([place]) 03/15/2015  . Hepatic fibrosis 06/08/2014  . Depression with anxiety 06/06/2013  . Perennial allergic rhinitis 06/06/2013  . Infertility associated with anovulation 06/06/2013    Past Medical History:  Diagnosis Date  . Allergy   . Anemia   . Anxiety   . Back pain   . Constipation   . Depression   . Fatty liver   . GERD (gastroesophageal reflux disease)   . Hyperlipidemia   . Infertility,  female   . Joint pain   . Kidney problem   . Membranous nephrosis   . Multiple food allergies   . Muscle pain   . Numbness and tingling in both hands   . Pre-diabetes   . Shortness of breath   . Sleep apnea   . Stress   . Swelling of both lower extremities     Current Outpatient Medications  Medication Sig Dispense Refill  . buPROPion (WELLBUTRIN SR) 150 MG 12 hr tablet Take 1 tablet (150 mg total) by mouth 2 (two) times daily. 180 tablet 0  . busPIRone (BUSPAR) 15 MG tablet TAKE ONE TABLET BY MOUTH TWICE A DAY OR AS DIRECTED. (Patient taking differently: Take 15 mg by mouth  2 (two) times daily. TAKE ONE TABLET BY MOUTH TWICE A DAY OR AS DIRECTED.) 180 tablet 3  . cetirizine (ZYRTEC) 10 MG tablet Take 10 mg by mouth daily.    . chlorthalidone (HYGROTON) 25 MG tablet Take 1 tablet (25 mg total) by mouth daily. 90 tablet 0  . citalopram (CELEXA) 40 MG tablet Take 1 tablet (40 mg total) by mouth daily. 90 tablet 1  . fluticasone (FLONASE) 50 MCG/ACT nasal spray Place 2 sprays into both nostrils daily. (Patient taking differently: Place 2 sprays into both nostrils daily as needed for allergies. ) 48 g 3  . levothyroxine (SYNTHROID) 88 MCG tablet Take 1 tablet (88 mcg total) by mouth daily. 90 tablet 0  . LORazepam (ATIVAN) 1 MG tablet Take 1 tablet (1 mg total) by mouth 2 (two) times daily as needed for anxiety. 20 tablet 0  . meclizine (ANTIVERT) 25 MG tablet Take 1 tablet (25 mg total) by mouth 3 (three) times daily as needed for dizziness. 30 tablet 0  . metFORMIN (GLUCOPHAGE) 500 MG tablet Take 1 tablet (500 mg total) by mouth daily with breakfast. 90 tablet 0  . montelukast (SINGULAIR) 10 MG tablet Take 1 tablet (10 mg total) by mouth at bedtime. 30 tablet 11  . Multiple Vitamin (MULTIVITAMIN) tablet Take 1 tablet by mouth daily.    . Omega-3 Fatty Acids (FISH OIL PO) Take 1 capsule by mouth 3 (three) times daily.     . Vitamin D, Ergocalciferol, (DRISDOL) 1.25 MG (50000 UT) CAPS capsule TAKE ONE CAPSULE BY MOUTH TWICE WEEKLY 10 capsule 0  . meloxicam (MOBIC) 15 MG tablet     . SUMAtriptan (IMITREX) 50 MG tablet Take 1 tablet (50 mg total) by mouth every 2 (two) hours as needed for migraine. 10 tablet 0   Current Facility-Administered Medications  Medication Dose Route Frequency Provider Last Rate Last Dose  . cefTRIAXone (ROCEPHIN) injection 1 g  1 g Intramuscular Once Rutherford Guys, MD        Allergies  Allergen Reactions  . Lidocaine Other (See Comments)    Flu like sx's  . Relafen [Nabumetone] Other (See Comments)    migraine  . Celebrex [Celecoxib]  Rash  . Sulfa Antibiotics Rash    Social History   Socioeconomic History  . Marital status: Married    Spouse name: [name]  . Number of children: 5  . Years of education: in [employer] program  . Highest education level: Bachelor's degree (e.g., BA, AB, BS)  Occupational History  . Occupation: Product manager: [employer]    Comment: ESL  Social Needs  . Financial resource strain: Not on file  . Food insecurity:    Worry: Not on file  Inability: Not on file  . Transportation needs:    Medical: Not on file    Non-medical: Not on file  Tobacco Use  . Smoking status: Never Smoker  . Smokeless tobacco: Never Used  Substance and Sexual Activity  . Alcohol use: No    Alcohol/week: 0.0 standard drinks  . Drug use: No  . Sexual activity: Yes    Partners: Male    Birth control/protection: Post-menopausal  Lifestyle  . Physical activity:    Days per week: Not on file    Minutes per session: Not on file  . Stress: Not on file  Relationships  . Social connections:    Talks on phone: Not on file    Gets together: Not on file    Attends religious service: Not on file    Active member of club or organization: Not on file    Attends meetings of clubs or organizations: Not on file    Relationship status: Not on file  . Intimate partner violence:    Fear of current or ex partner: Not on file    Emotionally abused: Not on file    Physically abused: Not on file    Forced sexual activity: Not on file  Other Topics Concern  . Not on file  Social History Narrative   Lives with her husband and their 5 adpoted children.    Adopted 2 sets of siblings, all with psychiatric diagnoses (ADHD, Asperger's, ODD, PTSD, depression, bipolar disorder).   Her husband has Asperger's, and works in disability services.   Occasionally drinks coke or pepsi     ROS See HPI Patient denies vomiting or diarrhea Patient denies loss of consciousness or slurred speech Patient denies double  vision or blurry vision Patient denies anxiety or panic attacks  Objective   Vitals as reported by the patient: There were no vitals filed for this visit.  This was a virtual visit so physical exam is not performed Normal pulmonary effort    Diagnoses and all orders for this visit:  Hypercalcemia  Acute nonintractable headache, unspecified headache type -     MR Brain W Wo Contrast; Future  Vertigo of central origin -     MR Brain W Wo Contrast; Future  Malaise and fatigue  Statin intolerance  Dyslipidemia  Hypertriglyceridemia  Other orders -     SUMAtriptan (IMITREX) 50 MG tablet; Take 1 tablet (50 mg total) by mouth every 2 (two) hours as needed for migraine.  Vertigo After this is a patient that is a pleasant 43 year old female with a history of migraines with vertigo as well as malaise and headache who is being evaluated for follow-up after labs a week ago.  Her labs were inconclusive as to cause of her symptoms and her story is unchanged thus will refer for MRI of the brain to evaluate for underlying cause that could lead to this vertigo prior to referral for vestibular rehabilitation or neurology referral.  Headaches For her headaches we will give a trial of sumatriptan and also advised patient to take the vertigo medication that was prescribed meclizine at bedtime.  Fatigue and malaise Her labs from a week ago to evaluate her malaise were negative for hyperparathyroidism and her hypercalcemia improved  Dyslipidemia She has a history of progressive dyslipidemia with elevations of total cholesterol to 398 as well as her triglycerides which are also elevated to 304 her LDL is 280.  She  has a history of statin intolerance and elevated liver transaminases thus  encouraged patient to keep her appoint with Dr. Lysbeth Penner office for lipid management     I discussed the assessment and treatment plan with the patient. The patient was provided an opportunity to ask questions  and all were answered. The patient agreed with the plan and demonstrated an understanding of the instructions.   The patient was advised to call back or seek an in-person evaluation if the symptoms worsen or if the condition fails to improve as anticipated.  I provided 25 minutes of non-face-to-face time during this encounter.  Forrest Moron, MD  Primary Care at [facility]

## 2018-07-30 NOTE — Progress Notes (Signed)
CC: 3 week follow up on lightheadedness, pt states she is feeling better. Unsure if medication is working.

## 2018-08-01 ENCOUNTER — Encounter (INDEPENDENT_AMBULATORY_CARE_PROVIDER_SITE_OTHER): Payer: Self-pay | Admitting: Physician Assistant

## 2018-08-01 ENCOUNTER — Other Ambulatory Visit: Payer: Self-pay

## 2018-08-01 ENCOUNTER — Ambulatory Visit (INDEPENDENT_AMBULATORY_CARE_PROVIDER_SITE_OTHER): Payer: BC Managed Care – PPO | Admitting: Physician Assistant

## 2018-08-01 ENCOUNTER — Other Ambulatory Visit (INDEPENDENT_AMBULATORY_CARE_PROVIDER_SITE_OTHER): Payer: Self-pay | Admitting: Physician Assistant

## 2018-08-01 DIAGNOSIS — E559 Vitamin D deficiency, unspecified: Secondary | ICD-10-CM | POA: Diagnosis not present

## 2018-08-01 DIAGNOSIS — Z6841 Body Mass Index (BMI) 40.0 and over, adult: Secondary | ICD-10-CM | POA: Diagnosis not present

## 2018-08-01 NOTE — Progress Notes (Signed)
Office: [phone]  /  Fax: [phone] TeleHealth Visit:  Melanie [name] has verbally consented to this TeleHealth visit today. The patient is located at home, the provider is located at the [employer] [facility] office. The participants in this visit include the listed provider and patient. The visit was conducted today via Webex.  HPI:   Chief Complaint: OBESITY Melanie [name] is here to discuss her progress with her obesity treatment plan. She is on the Category 3 plan and is following her eating plan approximately 75-85% of the time. She states she is exercising 0 minutes 0 times per week. Vaani reports that she is trying to eat on plan but has been experiencing nausea, fatigue, and headaches. She has been seeing her PCP and is scheduled for an MRI of her head next week. We were unable to weigh the patient today for this TeleHealth visit. She is unsure if she has lost or gained weight since her last visit. She has lost 11 lbs since starting treatment with [place].  Vitamin D deficiency Melanie [name] has a diagnosis of Vitamin D deficiency. She is currently taking Vit D twice weekly and denies nausea, vomiting or muscle weakness.  ASSESSMENT AND PLAN:  Vitamin D deficiency  Class 3 severe obesity with serious comorbidity and body mass index (BMI) of 45.0 to 49.9 in adult, unspecified obesity type (Melanie [name])  PLAN:  Vitamin D Deficiency Melanie [name] was informed that low Vitamin D levels contributes to fatigue and are associated with obesity, breast, and colon cancer. She agrees to continue taking Vit D twice weekly and will follow-up for routine testing of Vitamin D, at least 2-3 times per year. She was informed of the risk of over-replacement of Vitamin D and agrees to not increase her dose unless she discusses this with [place] first. Melanie [name] agrees to follow-up with our clinic in 2 weeks.  Obesity Melanie [name] is currently in the action stage of change. As such, her goal is to continue with weight loss efforts. She has  agreed to follow the Category 3 plan or journal 1500 calories + 95 grams of protein. Melanie [name] has been instructed to work up to a goal of 150 minutes of combined cardio and strengthening exercise per week for weight loss and overall health benefits. We discussed the following Behavioral Modification Strategies today: work on meal planning, easy cooking plans, and keeping healthy foods in the home.  Melanie [name] has agreed to follow-up with our clinic in 2 weeks. She was informed of the importance of frequent follow-up visits to maximize her success with intensive lifestyle modifications for her multiple health conditions.  ALLERGIES: Allergies  Allergen Reactions  . Lidocaine Other (See Comments)    Flu like sx's  . Relafen [Nabumetone] Other (See Comments)    migraine  . Celebrex [Celecoxib] Rash  . Sulfa Antibiotics Rash    MEDICATIONS: Current Outpatient Medications on File Prior to Visit  Medication Sig Dispense Refill  . buPROPion (WELLBUTRIN SR) 150 MG 12 hr tablet Take 1 tablet (150 mg total) by mouth 2 (two) times daily. 180 tablet 0  . busPIRone (BUSPAR) 15 MG tablet TAKE ONE TABLET BY MOUTH TWICE A DAY OR AS DIRECTED. (Patient taking differently: Take 15 mg by mouth 2 (two) times daily. TAKE ONE TABLET BY MOUTH TWICE A DAY OR AS DIRECTED.) 180 tablet 3  . cetirizine (ZYRTEC) 10 MG tablet Take 10 mg by mouth daily.    . chlorthalidone (HYGROTON) 25 MG tablet Take 1 tablet (25 mg total) by mouth  daily. 90 tablet 0  . citalopram (CELEXA) 40 MG tablet Take 1 tablet (40 mg total) by mouth daily. 90 tablet 1  . fluticasone (FLONASE) 50 MCG/ACT nasal spray Place 2 sprays into both nostrils daily. (Patient taking differently: Place 2 sprays into both nostrils daily as needed for allergies. ) 48 g 3  . levothyroxine (SYNTHROID) 88 MCG tablet Take 1 tablet (88 mcg total) by mouth daily. 90 tablet 0  . LORazepam (ATIVAN) 1 MG tablet Take 1 tablet (1 mg total) by mouth 2 (two) times daily as needed for  anxiety. 20 tablet 0  . meclizine (ANTIVERT) 25 MG tablet Take 1 tablet (25 mg total) by mouth 3 (three) times daily as needed for dizziness. 30 tablet 0  . meloxicam (MOBIC) 15 MG tablet     . metFORMIN (GLUCOPHAGE) 500 MG tablet Take 1 tablet (500 mg total) by mouth daily with breakfast. 90 tablet 0  . montelukast (SINGULAIR) 10 MG tablet Take 1 tablet (10 mg total) by mouth at bedtime. 30 tablet 11  . Multiple Vitamin (MULTIVITAMIN) tablet Take 1 tablet by mouth daily.    . Omega-3 Fatty Acids (FISH OIL PO) Take 1 capsule by mouth 3 (three) times daily.     . SUMAtriptan (IMITREX) 50 MG tablet Take 1 tablet (50 mg total) by mouth every 2 (two) hours as needed for migraine. 10 tablet 0  . Vitamin D, Ergocalciferol, (DRISDOL) 1.25 MG (50000 UT) CAPS capsule TAKE ONE CAPSULE BY MOUTH TWICE WEEKLY 10 capsule 0   Current Facility-Administered Medications on File Prior to Visit  Medication Dose Route Frequency Provider Last Rate Last Dose  . cefTRIAXone (ROCEPHIN) injection 1 g  1 g Intramuscular Once Rutherford Guys, MD        PAST MEDICAL HISTORY: Past Medical History:  Diagnosis Date  . Allergy   . Anemia   . Anxiety   . Back pain   . Constipation   . Depression   . Fatty liver   . GERD (gastroesophageal reflux disease)   . Hyperlipidemia   . Infertility, female   . Joint pain   . Kidney problem   . Membranous nephrosis   . Multiple food allergies   . Muscle pain   . Numbness and tingling in both hands   . Pre-diabetes   . Shortness of breath   . Sleep apnea   . Stress   . Swelling of both lower extremities     PAST SURGICAL HISTORY: Past Surgical History:  Procedure Laterality Date  . FINGER SURGERY Left     index finger  . KNEE SURGERY Left   . LIVER BIOPSY N/A 05/15/2014   Procedure: LIVER BIOPSY;  Surgeon: Inda Castle, MD;  Location: [facility];  Service: Endoscopy;  Laterality: N/A;  ultrasound to mark the liver  . TONSILLECTOMY AND ADENOIDECTOMY  1978  .  TYMPANOSTOMY TUBE PLACEMENT    . wrist cyst Left   . WRIST SURGERY     left - cyst removed    SOCIAL HISTORY: Social History   Tobacco Use  . Smoking status: Never Smoker  . Smokeless tobacco: Never Used  Substance Use Topics  . Alcohol use: No    Alcohol/week: 0.0 standard drinks  . Drug use: No    FAMILY HISTORY: Family History  Problem Relation Age of Onset  . Diabetes Mother   . Liver disease Mother        ESLD- unknown etiology  . Obesity Mother   .  Diabetes Maternal Grandmother   . Diabetes Paternal Grandmother   . Breast cancer Paternal Grandmother   . Stroke Maternal Grandfather   . Hyperlipidemia Father    ROS: Review of Systems  Gastrointestinal: Negative for nausea and vomiting.  Musculoskeletal:       Negative for muscle weakness.   PHYSICAL EXAM: Pt in no acute distress  RECENT LABS AND TESTS: BMET    Component Value Date/Time   NA 136 07/26/2018 1549   K 3.9 07/26/2018 1549   CL 97 07/26/2018 1549   CO2 25 07/26/2018 1549   GLUCOSE 131 (H) 07/26/2018 1549   GLUCOSE 118 (H) 09/08/2015 1514   BUN 18 07/26/2018 1549   CREATININE 1.10 (H) 07/26/2018 1549   CREATININE 0.85 09/08/2015 1514   CALCIUM 10.3 (H) 07/26/2018 1549   GFRNONAA 61 07/26/2018 1549   GFRNONAA 85 09/08/2015 1514   GFRAA 71 07/26/2018 1549   GFRAA >89 09/08/2015 1514   Lab Results  Component Value Date   HGBA1C 5.5 07/25/2018   HGBA1C 5.5 05/01/2018   HGBA1C 5.6 12/13/2017   HGBA1C 5.0 09/05/2017   HGBA1C 5.8 (H) 05/23/2017   Lab Results  Component Value Date   INSULIN 39.2 (H) 07/25/2018   INSULIN 30.4 (H) 05/01/2018   INSULIN 39.0 (H) 12/13/2017   INSULIN 24.3 09/05/2017   INSULIN 30.4 (H) 05/23/2017   CBC    Component Value Date/Time   WBC 8.5 07/26/2018 1549   WBC 8.3 08/27/2015 1633   RBC 4.61 07/26/2018 1549   RBC 4.27 08/27/2015 1633   HGB 14.6 07/26/2018 1549   HCT 41.6 07/26/2018 1549   PLT 254 07/26/2018 1549   MCV 90 07/26/2018 1549   MCH 31.7  07/26/2018 1549   MCH 31.1 08/27/2015 1633   MCHC 35.1 07/26/2018 1549   MCHC 34.3 08/27/2015 1633   RDW 12.8 07/26/2018 1549   LYMPHSABS 2.8 05/23/2017 1249   MONOABS 0.4 10/27/2013 1604   EOSABS 0.3 05/23/2017 1249   BASOSABS 0.0 05/23/2017 1249   Iron/TIBC/Ferritin/ %Sat    Component Value Date/Time   IRON 95 06/27/2013 1645   TIBC 371 06/27/2013 1645   FERRITIN 401 (H) 06/27/2013 1645   IRONPCTSAT 26 06/27/2013 1645   Lipid Panel     Component Value Date/Time   CHOL 398 (H) 07/26/2018 1549   TRIG 304 (H) 07/26/2018 1549   HDL 57 07/26/2018 1549   CHOLHDL 7.0 (H) 07/26/2018 1549   CHOLHDL 5.0 06/06/2013 1654   VLDL 27 06/06/2013 1654   LDLCALC 280 (H) 07/26/2018 1549   Hepatic Function Panel     Component Value Date/Time   PROT 6.3 07/26/2018 1549   ALBUMIN 4.0 07/26/2018 1549   AST 35 07/26/2018 1549   ALT 56 (H) 07/26/2018 1549   ALKPHOS 137 (H) 07/26/2018 1549   BILITOT 0.8 07/26/2018 1549   BILIDIR 0.2 04/07/2015 1349   IBILI 1.0 04/07/2015 1349      Component Value Date/Time   TSH 6.170 (H) 06/27/2018 1139   TSH 5.010 (H) 05/01/2018 1031   TSH 5.570 (H) 12/13/2017 1014   Results for JOANY, KHATIB (MRN [id number]) as of 08/01/2018 13:36  Ref. Range 07/25/2018 11:00  Vitamin D, 25-Hydroxy Latest Ref Range: 30.0 - 100.0 ng/mL 37.0    I, Michaelene Song, am acting as Location manager for [employer], PA-C I, Abby Potash, PA-C have reviewed above note and agree with its content

## 2018-08-13 ENCOUNTER — Encounter (INDEPENDENT_AMBULATORY_CARE_PROVIDER_SITE_OTHER): Payer: Self-pay

## 2018-08-14 ENCOUNTER — Other Ambulatory Visit: Payer: Self-pay

## 2018-08-14 ENCOUNTER — Telehealth: Payer: Self-pay | Admitting: Neurology

## 2018-08-14 ENCOUNTER — Other Ambulatory Visit: Payer: Self-pay | Admitting: Family Medicine

## 2018-08-14 ENCOUNTER — Ambulatory Visit (INDEPENDENT_AMBULATORY_CARE_PROVIDER_SITE_OTHER): Payer: BC Managed Care – PPO | Admitting: Physician Assistant

## 2018-08-14 DIAGNOSIS — R7303 Prediabetes: Secondary | ICD-10-CM | POA: Diagnosis not present

## 2018-08-14 DIAGNOSIS — R42 Dizziness and giddiness: Secondary | ICD-10-CM

## 2018-08-14 DIAGNOSIS — Z6841 Body Mass Index (BMI) 40.0 and over, adult: Secondary | ICD-10-CM | POA: Diagnosis not present

## 2018-08-14 NOTE — Telephone Encounter (Signed)
Improving at follow-up with Dr. Nolon Rod.  I did refill meclizine, but if any worsening symptoms, should be discussed at either telemedicine or in person visit.  Thanks.

## 2018-08-14 NOTE — Telephone Encounter (Signed)
I called pt to update her chart. No answer, left a message asking her to call me back.

## 2018-08-14 NOTE — Telephone Encounter (Signed)
Due to current COVID 19 pandemic, our office is severely reducing in office visits until further notice, in order to minimize the risk to our patients and healthcare providers.    Called patient and scheduled a virtual visit with Dr. Rexene Alberts for 6/4. Patient verbalized understanding of the [email or web address] process and I have sent an e-mail to [email or web address] with link and instructions as well as my name and our office number. Patient understands that they will receive a call from RN to update chart.   Pt understands that although there may be some limitations with this type of visit, we will take all precautions to reduce any security or privacy concerns.  Pt understands that this will be treated like an in office visit and we will file with pt's insurance, and there may be a patient responsible charge related to this service.

## 2018-08-14 NOTE — Progress Notes (Signed)
Office: [phone]  /  Fax: [phone] TeleHealth Visit:  Melanie [name] has verbally consented to this TeleHealth visit today. The patient is located at home, the provider is located at the [employer] [facility] office. The participants in this visit include the listed provider and patient. The visit was conducted today via Webex.  HPI:   Chief Complaint: OBESITY Melanie [name] is here to discuss her progress with her obesity treatment plan. She is on the Category 3 plan and is following her eating plan approximately 85% of the time. She states she is exercising 0 minutes 0 times per week. Melanie [name] reports that she is doing well on the plan and has been getting in adequate protein. She is only journaling when she eats things not on the plan. We were unable to weigh the patient today for this TeleHealth visit. She feels as if she has lost 2 lbs since her last visit. She has lost 11 lbs since starting treatment with [place].  Pre-Diabetes Melanie [name] has a diagnosis of prediabetes based on her elevated Hgb A1c and was informed this puts her at greater risk of developing diabetes. She is taking metformin currently and continues to work on diet and exercise to decrease risk of diabetes. She denies nausea, vomiting, or diarrhea. No polyphagia.  ASSESSMENT AND PLAN:  Prediabetes  Class 3 severe obesity with serious comorbidity and body mass index (BMI) of 45.0 to 49.9 in adult, unspecified obesity type [facility])  PLAN:  Pre-Diabetes Melanie [name] will continue to work on weight loss, exercise, and decreasing simple carbohydrates in her diet to help decrease the risk of diabetes. We dicussed metformin including benefits and risks. She was informed that eating too many simple carbohydrates or too many calories at one sitting increases the likelihood of GI side effects. Melanie [name] will continue metformin, weight loss, and follow-up with [place] as directed to monitor her progress.  Obesity [name] is currently in the action stage of  change. As such, her goal is to continue with weight loss efforts. She has agreed to follow the Category 3 plan.  Melanie [name] has been instructed to work up to a goal of 150 minutes of combined cardio and strengthening exercise per week for weight loss and overall health benefits. We discussed the following Behavioral Modification Strategies today: work on meal planning, easy cooking plans, and keeping healthy foods in the home.  Melanie [name] has agreed to follow-up with our clinic in 3 weeks. She was informed of the importance of frequent follow-up visits to maximize her success with intensive lifestyle modifications for her multiple health conditions.  ALLERGIES: Allergies  Allergen Reactions  . Lidocaine Other (See Comments)    Flu like sx's  . Relafen [Nabumetone] Other (See Comments)    migraine  . Celebrex [Celecoxib] Rash  . Sulfa Antibiotics Rash    MEDICATIONS: Current Outpatient Medications on File Prior to Visit  Medication Sig Dispense Refill  . buPROPion (WELLBUTRIN SR) 150 MG 12 hr tablet Take 1 tablet (150 mg total) by mouth 2 (two) times daily. 180 tablet 0  . busPIRone (BUSPAR) 15 MG tablet TAKE ONE TABLET BY MOUTH TWICE A DAY OR AS DIRECTED. (Patient taking differently: Take 15 mg by mouth 2 (two) times daily. TAKE ONE TABLET BY MOUTH TWICE A DAY OR AS DIRECTED.) 180 tablet 3  . cetirizine (ZYRTEC) 10 MG tablet Take 10 mg by mouth daily.    . chlorthalidone (HYGROTON) 25 MG tablet Take 1 tablet (25 mg total) by mouth daily. 90 tablet 0  .  citalopram (CELEXA) 40 MG tablet Take 1 tablet (40 mg total) by mouth daily. 90 tablet 1  . fluticasone (FLONASE) 50 MCG/ACT nasal spray Place 2 sprays into both nostrils daily. (Patient taking differently: Place 2 sprays into both nostrils daily as needed for allergies. ) 48 g 3  . levothyroxine (SYNTHROID) 88 MCG tablet Take 1 tablet (88 mcg total) by mouth daily. 90 tablet 0  . LORazepam (ATIVAN) 1 MG tablet Take 1 tablet (1 mg total) by mouth 2  (two) times daily as needed for anxiety. 20 tablet 0  . meclizine (ANTIVERT) 25 MG tablet Take 1 tablet (25 mg total) by mouth 3 (three) times daily as needed for dizziness. 30 tablet 0  . meloxicam (MOBIC) 15 MG tablet     . metFORMIN (GLUCOPHAGE) 500 MG tablet Take 1 tablet (500 mg total) by mouth daily with breakfast. 90 tablet 0  . montelukast (SINGULAIR) 10 MG tablet Take 1 tablet (10 mg total) by mouth at bedtime. 30 tablet 11  . Multiple Vitamin (MULTIVITAMIN) tablet Take 1 tablet by mouth daily.    . Omega-3 Fatty Acids (FISH OIL PO) Take 1 capsule by mouth 3 (three) times daily.     . Vitamin D, Ergocalciferol, (DRISDOL) 1.25 MG (50000 UT) CAPS capsule TAKE ONE CAPSULE BY MOUTH TWICE WEEKLY 10 capsule 0   Current Facility-Administered Medications on File Prior to Visit  Medication Dose Route Frequency Provider Last Rate Last Dose  . cefTRIAXone (ROCEPHIN) injection 1 g  1 g Intramuscular Once Rutherford Guys, MD        PAST MEDICAL HISTORY: Past Medical History:  Diagnosis Date  . Allergy   . Anemia   . Anxiety   . Back pain   . Constipation   . Depression   . Fatty liver   . GERD (gastroesophageal reflux disease)   . Hyperlipidemia   . Infertility, female   . Joint pain   . Kidney problem   . Membranous nephrosis   . Multiple food allergies   . Muscle pain   . Numbness and tingling in both hands   . Pre-diabetes   . Shortness of breath   . Sleep apnea   . Stress   . Swelling of both lower extremities     PAST SURGICAL HISTORY: Past Surgical History:  Procedure Laterality Date  . FINGER SURGERY Left     index finger  . KNEE SURGERY Left   . LIVER BIOPSY N/A 05/15/2014   Procedure: LIVER BIOPSY;  Surgeon: Inda Castle, MD;  Location: [facility];  Service: Endoscopy;  Laterality: N/A;  ultrasound to mark the liver  . TONSILLECTOMY AND ADENOIDECTOMY  1978  . TYMPANOSTOMY TUBE PLACEMENT    . wrist cyst Left   . WRIST SURGERY     left - cyst removed     SOCIAL HISTORY: Social History   Tobacco Use  . Smoking status: Never Smoker  . Smokeless tobacco: Never Used  Substance Use Topics  . Alcohol use: No    Alcohol/week: 0.0 standard drinks  . Drug use: No    FAMILY HISTORY: Family History  Problem Relation Age of Onset  . Diabetes Mother   . Liver disease Mother        ESLD- unknown etiology  . Obesity Mother   . Diabetes Maternal Grandmother   . Diabetes Paternal Grandmother   . Breast cancer Paternal Grandmother   . Stroke Maternal Grandfather   . Hyperlipidemia Father    ROS:  Review of Systems  Gastrointestinal: Negative for diarrhea, nausea and vomiting.  Endo/Heme/Allergies:       Negative for polyphagia.   PHYSICAL EXAM: Pt in no acute distress  RECENT LABS AND TESTS: BMET    Component Value Date/Time   NA 136 07/26/2018 1549   K 3.9 07/26/2018 1549   CL 97 07/26/2018 1549   CO2 25 07/26/2018 1549   GLUCOSE 131 (H) 07/26/2018 1549   GLUCOSE 118 (H) 09/08/2015 1514   BUN 18 07/26/2018 1549   CREATININE 1.10 (H) 07/26/2018 1549   CREATININE 0.85 09/08/2015 1514   CALCIUM 10.3 (H) 07/26/2018 1549   GFRNONAA 61 07/26/2018 1549   GFRNONAA 85 09/08/2015 1514   GFRAA 71 07/26/2018 1549   GFRAA >89 09/08/2015 1514   Lab Results  Component Value Date   HGBA1C 5.5 07/25/2018   HGBA1C 5.5 05/01/2018   HGBA1C 5.6 12/13/2017   HGBA1C 5.0 09/05/2017   HGBA1C 5.8 (H) 05/23/2017   Lab Results  Component Value Date   INSULIN 39.2 (H) 07/25/2018   INSULIN 30.4 (H) 05/01/2018   INSULIN 39.0 (H) 12/13/2017   INSULIN 24.3 09/05/2017   INSULIN 30.4 (H) 05/23/2017   CBC    Component Value Date/Time   WBC 8.5 07/26/2018 1549   WBC 8.3 08/27/2015 1633   RBC 4.61 07/26/2018 1549   RBC 4.27 08/27/2015 1633   HGB 14.6 07/26/2018 1549   HCT 41.6 07/26/2018 1549   PLT 254 07/26/2018 1549   MCV 90 07/26/2018 1549   MCH 31.7 07/26/2018 1549   MCH 31.1 08/27/2015 1633   MCHC 35.1 07/26/2018 1549   MCHC 34.3  08/27/2015 1633   RDW 12.8 07/26/2018 1549   LYMPHSABS 2.8 05/23/2017 1249   MONOABS 0.4 10/27/2013 1604   EOSABS 0.3 05/23/2017 1249   BASOSABS 0.0 05/23/2017 1249   Iron/TIBC/Ferritin/ %Sat    Component Value Date/Time   IRON 95 06/27/2013 1645   TIBC 371 06/27/2013 1645   FERRITIN 401 (H) 06/27/2013 1645   IRONPCTSAT 26 06/27/2013 1645   Lipid Panel     Component Value Date/Time   CHOL 398 (H) 07/26/2018 1549   TRIG 304 (H) 07/26/2018 1549   HDL 57 07/26/2018 1549   CHOLHDL 7.0 (H) 07/26/2018 1549   CHOLHDL 5.0 06/06/2013 1654   VLDL 27 06/06/2013 1654   LDLCALC 280 (H) 07/26/2018 1549   Hepatic Function Panel     Component Value Date/Time   PROT 6.3 07/26/2018 1549   ALBUMIN 4.0 07/26/2018 1549   AST 35 07/26/2018 1549   ALT 56 (H) 07/26/2018 1549   ALKPHOS 137 (H) 07/26/2018 1549   BILITOT 0.8 07/26/2018 1549   BILIDIR 0.2 04/07/2015 1349   IBILI 1.0 04/07/2015 1349      Component Value Date/Time   TSH 6.170 (H) 06/27/2018 1139   TSH 5.010 (H) 05/01/2018 1031   TSH 5.570 (H) 12/13/2017 1014   Results for RAYLINN, KOSAR (MRN [id number]) as of 08/14/2018 15:03  Ref. Range 07/25/2018 11:00  Vitamin D, 25-Hydroxy Latest Ref Range: 30.0 - 100.0 ng/mL 37.0    I, Michaelene Song, am acting as Location manager for [employer], PA-C I, Abby Potash, PA-C have reviewed above note and agree with its content

## 2018-08-14 NOTE — Telephone Encounter (Signed)
Last seen 07/26/18 telemed. Okay to continue refills?

## 2018-08-15 ENCOUNTER — Telehealth: Payer: Self-pay

## 2018-08-15 ENCOUNTER — Other Ambulatory Visit: Payer: Self-pay

## 2018-08-15 ENCOUNTER — Encounter: Payer: Self-pay | Admitting: Neurology

## 2018-08-15 ENCOUNTER — Ambulatory Visit (INDEPENDENT_AMBULATORY_CARE_PROVIDER_SITE_OTHER): Payer: BC Managed Care – PPO | Admitting: Neurology

## 2018-08-15 DIAGNOSIS — R635 Abnormal weight gain: Secondary | ICD-10-CM

## 2018-08-15 DIAGNOSIS — R519 Headache, unspecified: Secondary | ICD-10-CM

## 2018-08-15 DIAGNOSIS — R51 Headache: Secondary | ICD-10-CM

## 2018-08-15 DIAGNOSIS — G4733 Obstructive sleep apnea (adult) (pediatric): Secondary | ICD-10-CM | POA: Diagnosis not present

## 2018-08-15 DIAGNOSIS — Z6841 Body Mass Index (BMI) 40.0 and over, adult: Secondary | ICD-10-CM

## 2018-08-15 DIAGNOSIS — R42 Dizziness and giddiness: Secondary | ICD-10-CM

## 2018-08-15 MED ORDER — UBROGEPANT 50 MG PO TABS: 50.0000 mg | ORAL_TABLET | ORAL | 3 refills | Status: DC | PRN

## 2018-08-15 NOTE — Telephone Encounter (Signed)
Pt has been experiencing migraines, dizziness, tremors in arms for a while. She is using an oral appliance to treat her osa and not using cpap any longer. Her migraines come a couple times per week. She experiences light and sound sensitivity, not so much N/V.  Pt's meds, allergies, and PMH were updated.  Pt understands the doxy.me process.

## 2018-08-15 NOTE — Patient Instructions (Addendum)
Melanie [name]  08/15/2018   Your procedure is scheduled on: Wednesday 08/21/2018  Report to [facility] Main  Entrance              Report to admitting at [place] [facility] TEST ON____MONDAY,  JUNE 8TH____, THIS TEST MUST BE DONE BEFORE SURGERY, COME TO [place].     Call this number if you have problems the morning of surgery [place].    NO SOLID FOOD AFTER MIDNIGHT THE NIGHT PRIOR TO SURGERY. NOTHING BY MOUTH EXCEPT CLEAR LIQUIDS UNTIL  0430 am.             PLEASE FINISH Gatorade G 2  DRINK PER SURGEON ORDER  WHICH NEEDS TO BE COMPLETED AT   0430 am.   CLEAR LIQUID DIET   Foods Allowed                                                                     Foods Excluded  Coffee and tea, regular and decaf                             liquids that you cannot  Plain Jell-O in any flavor                                             see through such as: Fruit ices (not with fruit pulp)                                     milk, soups, orange juice  Iced Popsicles                                    All solid food Carbonated beverages, regular and diet                                    Cranberry, grape and apple juices Sports drinks like Gatorade Lightly seasoned clear broth or consume(fat free) Sugar, honey syrup  Sample Menu Breakfast                                Lunch                                     Supper Cranberry juice                    Beef broth  Chicken broth Jell-O                                     Grape juice                           Apple juice Coffee or tea                        Jell-O                                      Popsicle                                                Coffee or tea                        Coffee or tea     How to Manage Your Diabetes Before and After Surgery  Why  is it important to control my blood sugar before and after surgery? . Improving blood sugar levels before and after surgery helps healing and can limit problems. . A way of improving blood sugar control is eating a healthy diet by: o  Eating less sugar and carbohydrates o  Increasing activity/exercise o  Talking with your doctor about reaching your blood sugar goals . High blood sugars (greater than 180 mg/dL) can raise your risk of infections and slow your recovery, so you will need to focus on controlling your diabetes during the weeks before surgery. . Make sure that the doctor who takes care of your diabetes knows about your planned surgery including the date and location.  How do I manage my blood sugar before surgery? . Check your blood sugar at least 4 times a day, starting 2 days before surgery, to make sure that the level is not too high or low. o Check your blood sugar the morning of your surgery when you wake up and every 2 hours until you get to the Short Stay unit. . If your blood sugar is less than 70 mg/dL, you will need to treat for low blood sugar: o Do not take insulin. o Treat a low blood sugar (less than 70 mg/dL) with  cup of clear juice (cranberry or apple), 4 glucose tablets, OR glucose gel. o Recheck blood sugar in 15 minutes after treatment (to make sure it is greater than 70 mg/dL). If your blood sugar is not greater than 70 mg/dL on recheck, call [phone] for further instructions. . Report your blood sugar to the short stay nurse when you get to Short Stay.  . If you are admitted to the hospital after surgery: o Your blood sugar will be checked by the staff and you will probably be given insulin after surgery (instead of oral diabetes medicines) to make sure you have good blood sugar levels. o The goal for blood sugar control after surgery is 80-180 mg/dL.    WHAT DO I DO ABOUT MY DIABETES MEDICATION?  Marland Kitchen Do not take oral diabetes medicines (pills) the morning  of surgery.      Remember: Do not eat food  :  After Midnight.                      _____________________________________________________________________                BRUSH YOUR TEETH MORNING OF SURGERY AND RINSE YOUR MOUTH OUT, NO CHEWING GUM CANDY OR MINTS.      Take these medicines the morning of surgery with A SIP OF WATER: Buspirone (Buspar), Levothyroxine (Synthroid), Citalopram (Celexa), Bupropion (Wellbutrin SR)                                         You may not have any metal on your body including hair pins and              piercings  Do not wear jewelry, make-up, lotions, powders or perfumes, deodorant             Do not wear nail polish.  Do not shave  48 hours prior to surgery.               Do not bring valuables to the hospital. [place].  Contacts, dentures or bridgework may not be worn into surgery.  Leave suitcase in the car. After surgery it may be brought to your room.     Patients discharged the day of surgery will not be allowed to drive home. IF YOU ARE HAVING SURGERY AND GOING HOME THE SAME DAY, YOU MUST HAVE AN ADULT TO DRIVE YOU HOME AND BE WITH YOU FOR 24 HOURS. YOU MAY GO HOME BY TAXI OR UBER OR ORTHERWISE, BUT AN ADULT MUST ACCOMPANY YOU HOME AND STAY WITH YOU FOR 24 HOURS.  Name and phone number of your driver:                Please read over the following fact sheets you were given: _____________________________________________________________________             [facility] - Preparing for Surgery Before surgery, you can play an important role.  Because skin is not sterile, your skin needs to be as free of germs as possible.  You can reduce the number of germs on your skin by washing with CHG (chlorahexidine gluconate) soap before surgery.  CHG is an antiseptic cleaner which kills germs and bonds with the skin to continue killing germs even after washing. Please DO NOT use if you have  an allergy to CHG or antibacterial soaps.  If your skin becomes reddened/irritated stop using the CHG and inform your nurse when you arrive at Short Stay. Do not shave (including legs and underarms) for at least 48 hours prior to the first CHG shower.  You may shave your face/neck. Please follow these instructions carefully:  1.  Shower with CHG Soap the night before surgery and the  morning of Surgery.  2.  If you choose to wash your hair, wash your hair first as usual with your  normal  shampoo.  3.  After you shampoo, rinse your hair and body thoroughly to remove the  shampoo.                           4.  Use CHG as you would any other liquid soap.  You  can apply chg directly  to the skin and wash                       Gently with a scrungie or clean washcloth.  5.  Apply the CHG Soap to your body ONLY FROM THE NECK DOWN.   Do not use on face/ open                           Wound or open sores. Avoid contact with eyes, ears mouth and genitals (private parts).                       Wash face,  Genitals (private parts) with your normal soap.             6.  Wash thoroughly, paying special attention to the area where your surgery  will be performed.  7.  Thoroughly rinse your body with warm water from the neck down.  8.  DO NOT shower/wash with your normal soap after using and rinsing off  the CHG Soap.                9.  Pat yourself dry with a clean towel.            10.  Wear clean pajamas.            11.  Place clean sheets on your bed the night of your first shower and do not  sleep with pets. Day of Surgery : Do not apply any lotions/deodorants the morning of surgery.  Please wear clean clothes to the hospital/surgery center.  FAILURE TO FOLLOW THESE INSTRUCTIONS MAY RESULT IN THE CANCELLATION OF YOUR SURGERY PATIENT SIGNATURE_________________________________  NURSE  SIGNATURE__________________________________  ________________________________________________________________________   Adam Phenix  An incentive spirometer is a tool that can help keep your lungs clear and active. This tool measures how well you are filling your lungs with each breath. Taking long deep breaths may help reverse or decrease the chance of developing breathing (pulmonary) problems (especially infection) following:  A long period of time when you are unable to move or be active. BEFORE THE PROCEDURE   If the spirometer includes an indicator to show your best effort, your nurse or respiratory therapist will set it to a desired goal.  If possible, sit up straight or lean slightly forward. Try not to slouch.  Hold the incentive spirometer in an upright position. INSTRUCTIONS FOR USE  1. Sit on the edge of your bed if possible, or sit up as far as you can in bed or on a chair. 2. Hold the incentive spirometer in an upright position. 3. Breathe out normally. 4. Place the mouthpiece in your mouth and seal your lips tightly around it. 5. Breathe in slowly and as deeply as possible, raising the piston or the ball toward the top of the column. 6. Hold your breath for 3-5 seconds or for as long as possible. Allow the piston or ball to fall to the bottom of the column. 7. Remove the mouthpiece from your mouth and breathe out normally. 8. Rest for a few seconds and repeat Steps 1 through 7 at least 10 times every 1-2 hours when you are awake. Take your time and take a few normal breaths between deep breaths. 9. The spirometer may include an indicator to show your best effort. Use the indicator as a goal to work toward during  each repetition. 10. After each set of 10 deep breaths, practice coughing to be sure your lungs are clear. If you have an incision (the cut made at the time of surgery), support your incision when coughing by placing a pillow or rolled up towels firmly  against it. Once you are able to get out of bed, walk around indoors and cough well. You may stop using the incentive spirometer when instructed by your caregiver.  RISKS AND COMPLICATIONS  Take your time so you do not get dizzy or light-headed.  If you are in pain, you may need to take or ask for pain medication before doing incentive spirometry. It is harder to take a deep breath if you are having pain. AFTER USE  Rest and breathe slowly and easily.  It can be helpful to keep track of a log of your progress. Your caregiver can provide you with a simple table to help with this. If you are using the spirometer at home, follow these instructions: [place] IF:   You are having difficultly using the spirometer.  You have trouble using the spirometer as often as instructed.  Your pain medication is not giving enough relief while using the spirometer.  You develop fever of 100.5 F (38.1 C) or higher. SEEK IMMEDIATE MEDICAL CARE IF:   You cough up bloody sputum that had not been present before.  You develop fever of 102 F (38.9 C) or greater.  You develop worsening pain at or near the incision site. MAKE SURE YOU:   Understand these instructions.  Will watch your condition.  Will get help right away if you are not doing well or get worse. Document Released: 07/10/2006 Document Revised: 05/22/2011 Document Reviewed: 09/10/2006 [facility] Patient Information 2014 [place], [place].   ________________________________________________________________________

## 2018-08-15 NOTE — Progress Notes (Signed)
07/25/2018- noted in [place]- [id number]-5.5  07/09/2018- noted in Epic-EKG  05/25/2017- noted in Epic-ECHO

## 2018-08-15 NOTE — Telephone Encounter (Signed)
PA for Melanie [name] was denied by insurance. Pt has been given savings card info.

## 2018-08-15 NOTE — Telephone Encounter (Signed)
I completed PA for ubrelvy via covermymeds. KEY: [id number]. Sent to [facility]. Should have a determination in 1-3 business days.  I called pt, advised her to use ubrelvy savings card at [email or web address] regardless of insurance coverage, because it will likely be cheaper. Pt verbalized understanding and asked that I send her a mychart message with this info as well.

## 2018-08-15 NOTE — Progress Notes (Signed)
Star Age, MD, PhD [facility] [address], [address] [place], [place] [address]   Virtual Visit via Video Note on 08/15/2018:  I connected with Melanie [name] on 08/15/18 at  2:30 PM EDT by a video enabled telemedicine application and verified that I am speaking with the correct person using two identifiers.   I discussed the limitations of evaluation and management by telemedicine and the availability of in person appointments. The patient expressed understanding and agreed to proceed.  History of Present Illness: Melanie [name] is a 43 year old right-handed woman with an underlying medical prediabetes, hyperlipidemia, reflux disease, depression, anxiety, allergies, anemia, lower extremity edema, OSA and morbid obesity with a BMI of over 45, who presents for a virtual, video based appointment via doxy.me for evaluation of her recurrent headaches.  She is referred by Dr. Carlota Raspberry.   She reports that for the past 2 or 3 months she has had recurrent nearly daily headaches, they are often behind her eyes, right side more than left, sometimes viselike, sometimes stabbing or throbbing, sometimes in the top of her head.  She has some associated photophobia, rare nausea or vomiting, today she had some nausea with it.  She has no prior diagnosis of migraine headaches.  She was given a prescription for Imitrex by Dr. Nolon Rod in in May 2020 when she had a virtual visit with her.  The patient reports that she has taken it almost daily.  She is strongly discouraged to take it every day.  She is advised that it is for as needed use only.  She states that she got a refill just yesterday.  She has a prior history of morning headaches.  I have seen her over 3 years ago for evaluation of her obstructive sleep apnea concerns.  She had a split-night sleep study in March 2017 which indicated moderate to severe obstructive sleep apnea.  She decided to proceed with treatment with an oral  appliance through her dentist.  She follows with medical weight management for her obesity. Dr. Nolon Rod ordered a brain MRI, this is pending for later this month.  She has surgery on her right ulnar nerve scheduled for 08/21/2018.  She has a torn tendon.  She reports that she has been using her oral appliance, she has received the oral appliance through her dentist practice, [employer].  She has not had a follow-up sleep study.  She has had interim weight gain, at the time of her sleep study on 06/08/2015 her weight was documented at 296 pounds, she currently reports her weight at 325 pounds.  Her weight has been plateaued lately.  She is due for an eye examination this summer.  She feels that she needs a new prescription.  She has prescription eyeglasses.  She has occasional blurry vision.  She feels that the Imitrex 50 mg strength has been somewhat helpful.  She tries to hydrate well with water, estimates that she drinks about 3-4 bottles of water per day.  She has limited caffeine intake, typically 1 diet Coke per day on average.  She has had intermittent lightheadedness, has a history of vertigo but most recently has felt more lightheaded than vertiginous.  She has had some intermittent tremors affecting both hands, left more than right, it comes and goes, mostly when she holds something.  Previously:   06/01/2015: Melanie [name] is a 43 year old right-handed woman with an underlying medical history of allergies, anxiety, depression, abnormal LFTs, and morbid obesity, who reports  snoring and excessive daytime somnolence, as well as witnessed apneic breathing pauses and morning headaches, difficulty initiating and maintaining sleep at times. She had a tonsillectomy and adenoidectomy as a child. She has a longer standing history of difficulty initiating and maintaining sleep. She feels it is hard for her to shut down at night. She sometimes watches TV in the family room until she feels sleepy enough to  go to bed. Sometimes and that she reads or uses her cell phone. She does not typically watch TV in bed. Her husband sleeps with a CPAP but it does not disturb her. She has woken herself up with her snoring and a sense of gasping for air. She has had occasional morning headaches and nocturia is about once per night. She denies restless leg symptoms. Sometimes one of her cats jumped onto the bed and disturbs her sleep. She has tried over-the-counter sleep aids including p.m. type medications and melatonin and while these medications can work for a couple of days, they also keep her quite groggy during the day so she does not usually take any of these medications during the workweek. She has also been tried in the past on prescription sleeping pills including Ambien and Lunesta and perhaps Remeron and again she did not have any sustained results with any of the prescription medications and also suffered from daytime grogginess. She suspects that there is family history of sleep apnea, at least in her father but both parents snored quite loudly she recalls that she was growing up. Bedtime is around 10 PM and he can take her long to fall asleep. Rise time is around 5:30, she does not typically wake up rested and often feels tired first thing in the morning. She does not drink caffeine on a regular basis, occasional cola. She is a nonsmoker and does not drink alcohol. She lives at home with her husband and 3 foster children ages 49, 2 and 53, she is in the process of adopting her foster children. She works as a Pharmacist, hospital at [employer]. Her Epworth sleepiness score is 14 out of 24 today, her fatigue score is 45 out of 63.     Her Past Medical History Is Significant For: Past Medical History:  Diagnosis Date   Allergy    Anemia    Anxiety    Back pain    Constipation    Depression    Fatty liver    GERD (gastroesophageal reflux disease)    Hyperlipidemia    Infertility, female    Joint pain    Kidney  problem    Membranous nephrosis    Multiple food allergies    Muscle pain    Numbness and tingling in both hands    Pre-diabetes    Shortness of breath    Sleep apnea    Stress    Swelling of both lower extremities     Her Past Surgical History Is Significant For: Past Surgical History:  Procedure Laterality Date   FINGER SURGERY Left     index finger   KNEE SURGERY Left    LIVER BIOPSY N/A 05/15/2014   Procedure: LIVER BIOPSY;  Surgeon: Inda Castle, MD;  Location: [facility];  Service: Endoscopy;  Laterality: N/A;  ultrasound to mark the liver   TONSILLECTOMY AND ADENOIDECTOMY  1978   TYMPANOSTOMY TUBE PLACEMENT     wrist cyst Left    WRIST SURGERY     left - cyst removed    Her Family History Is  Significant For: Family History  Problem Relation Age of Onset   Diabetes Mother    Liver disease Mother        ESLD- unknown etiology   Obesity Mother    Diabetes Maternal Grandmother    Diabetes Paternal Grandmother    Breast cancer Paternal Grandmother    Stroke Maternal Grandfather    Hyperlipidemia Father     Her Social History Is Significant For: Social History   Socioeconomic History   Marital status: Married    Spouse name: [name]   Number of children: 5   Years of education: in [employer] program   Highest education level: Bachelor's degree (e.g., BA, AB, BS)  Occupational History   Occupation: Product manager: [employer]    Comment: ESL  Social Designer, fashion/clothing strain: Not on file   Food insecurity:    Worry: Not on file    Inability: Not on file   Transportation needs:    Medical: Not on file    Non-medical: Not on file  Tobacco Use   Smoking status: Never Smoker   Smokeless tobacco: Never Used  Substance and Sexual Activity   Alcohol use: No    Alcohol/week: 0.0 standard drinks   Drug use: No   Sexual activity: Yes    Partners: Male    Birth control/protection: Post-menopausal    Lifestyle   Physical activity:    Days per week: Not on file    Minutes per session: Not on file   Stress: Not on file  Relationships   Social connections:    Talks on phone: Not on file    Gets together: Not on file    Attends religious service: Not on file    Active member of club or organization: Not on file    Attends meetings of clubs or organizations: Not on file    Relationship status: Not on file  Other Topics Concern   Not on file  Social History Narrative   Lives with her husband and their 5 adpoted children.    Adopted 2 sets of siblings, all with psychiatric diagnoses (ADHD, Asperger's, ODD, PTSD, depression, bipolar disorder).   Her husband has Asperger's, and works in disability services.   Occasionally drinks coke or pepsi     Her Allergies Are:  Allergies  Allergen Reactions   Lidocaine Other (See Comments)    Flu like sx's   Relafen [Nabumetone] Other (See Comments)    migraine   Celebrex [Celecoxib] Rash   Sulfa Antibiotics Rash  :   Her Current Medications Are:  Outpatient Encounter Medications as of 08/15/2018  Medication Sig   buPROPion (WELLBUTRIN SR) 150 MG 12 hr tablet Take 1 tablet (150 mg total) by mouth 2 (two) times daily.   busPIRone (BUSPAR) 15 MG tablet TAKE ONE TABLET BY MOUTH TWICE A DAY OR AS DIRECTED. (Patient taking differently: Take 15 mg by mouth 2 (two) times daily. TAKE ONE TABLET BY MOUTH TWICE A DAY OR AS DIRECTED.)   cetirizine (ZYRTEC) 10 MG tablet Take 10 mg by mouth at bedtime.    chlorthalidone (HYGROTON) 25 MG tablet Take 1 tablet (25 mg total) by mouth daily.   citalopram (CELEXA) 40 MG tablet Take 1 tablet (40 mg total) by mouth daily.   fluticasone (FLONASE) 50 MCG/ACT nasal spray Place 2 sprays into both nostrils daily. (Patient taking differently: Place 2 sprays into both nostrils daily as needed for allergies. )   levothyroxine (SYNTHROID)  88 MCG tablet Take 1 tablet (88 mcg total) by mouth daily. (Patient  taking differently: Take 88 mcg by mouth daily before breakfast. )   LORazepam (ATIVAN) 1 MG tablet Take 1 tablet (1 mg total) by mouth 2 (two) times daily as needed for anxiety.   meclizine (ANTIVERT) 25 MG tablet TAKE ONE TABLET BY MOUTH THREE TIMES A DAY AS NEEDED FOR DIZZINESS (Patient taking differently: Take 25 mg by mouth 3 (three) times daily as needed for dizziness. )   meloxicam (MOBIC) 15 MG tablet Take 15 mg by mouth daily as needed for pain.    metFORMIN (GLUCOPHAGE) 500 MG tablet Take 1 tablet (500 mg total) by mouth daily with breakfast.   montelukast (SINGULAIR) 10 MG tablet Take 1 tablet (10 mg total) by mouth at bedtime.   Multiple Vitamin (MULTIVITAMIN) tablet Take 1 tablet by mouth daily.   Omega-3 Fatty Acids (FISH OIL PO) Take 1 capsule by mouth 2 (two) times a day.    SUMAtriptan (IMITREX) 50 MG tablet TAKE ONE TABLET BY MOUTH AT ONSET OF HEADACHE; MAY REPEAT ONE TABLET IN 2 HOURS IF NEEDED. (Patient taking differently: Take 50 mg by mouth See admin instructions. Take 1 tablet at onset of headache; may repeat one tablet in 2 hours if needed.)   Vitamin D, Ergocalciferol, (DRISDOL) 1.25 MG (50000 UT) CAPS capsule TAKE ONE CAPSULE BY MOUTH TWICE WEEKLY (Patient taking differently: Take 50,000 Units by mouth 2 (two) times a week. Tuesday and Friday)   No facility-administered encounter medications on file as of 08/15/2018.   :   Review of Systems:  Out of a complete 14 point review of systems, all are reviewed and negative with the exception of these symptoms as listed below:  Observations/Objective: On examination, she is pleasant and conversant, in no acute distress, good comprehension and language skills, speech is clear without dysarthria, hypophonia or voice tremor.  She wears corrective eyeglasses.  Eye examination reveals normal extraocular movements in all directions, no nystagmus.  Hearing is grossly intact.  Face is symmetric with normal facial animation.   Airway examination reveals no focal abnormality. Motor examination reveals limited mobility in the right elbow.  She has normal-appearing bulk.  She has no abnormal involuntary movements, in particular, no obvious resting tremor in the upper extremities, no postural tremor, no action tremor, no past-pointing or dysmetria or intention tremor.  Fine motor skills with finger taps are well preserved bilaterally, hand movements fine, elbow mobility limited and finger-to-nose testing was a little difficult for her with the right hand, other than that fine.  Romberg is negative.  She has slight difficulty with tandem walk, otherwise no problem with gait.  Assessment and Plan:  In summary, MOMO BRAUN is a very pleasant 43 y.o.-year old female with an underlying medical prediabetes, hyperlipidemia, reflux disease, depression, anxiety, allergies, anemia, lower extremity edema, OSA and morbid obesity with a BMI of over 47, Presents for a virtual visit for evaluation of her recurrent headaches.  She has had recurrent nearly daily headaches for the past 2 to 3 months.  She does not have a telltale description of her headaches in keeping with migraines but has benefited to some degree from migraine medication.  She is advised that these are as needed medications only and not designed to be taken daily.  She could have side effects if she were to take Imitrex daily including blood pressure surges.  I suggested we try her on Ubrelvy, 50 mg strength 1 pill  as needed with the possibility of repeating it after 2 hours.  She is advised not to take it together with Imitrex but she can take 1 or the other.  Her physical examination in particular her neurological examination reveals no obvious abnormality.  She currently does not have any obvious vertigo type symptoms or signs.  She has no obvious tremor today and is largely reassured in that regard. She has a history of moderate obstructive sleep apnea but has also had some  weight gain since then, it is possible that her sleep apnea has become worse and could be in part related to these daily headaches.  She is advised to proceed with a sleep study, she can have the sleep study with her oral appliance in place so to see the efficacy of the oral appliance.  She is scheduled for a brain scan later this month. I will order a sleep study. I provided a new prescription for Ubrelvy 50 mg.   She is furthermore encouraged to get her eye examination done as she has had some blurry vision and feels like her prescription has changed, this may help her headaches as well.  I answered all her questions today and the patient was in agreement with the above outlined plan. I plan to see her back in about 3 months, sooner if needed.   Follow Up Instructions:    I discussed the assessment and treatment plan with the patient. The patient was provided an opportunity to ask questions and all were answered. The patient agreed with the plan and demonstrated an understanding of the instructions.   The patient was advised to call back or seek an in-person evaluation if the symptoms worsen or if the condition fails to improve as anticipated.  I provided 30 minutes of non-face-to-face time during this encounter.   Star Age, MD

## 2018-08-15 NOTE — Patient Instructions (Signed)
Given verbally, during today's virtual video-based encounter, with verbal feedback received.   

## 2018-08-16 ENCOUNTER — Other Ambulatory Visit: Payer: Self-pay

## 2018-08-16 ENCOUNTER — Encounter (HOSPITAL_COMMUNITY): Payer: Self-pay

## 2018-08-16 ENCOUNTER — Encounter (HOSPITAL_COMMUNITY)
Admission: RE | Admit: 2018-08-16 | Discharge: 2018-08-16 | Disposition: A | Discharge status: Home / Self Care | Payer: BC Managed Care – PPO | Source: Ambulatory Visit | Attending: Orthopaedic Surgery | Admitting: Orthopaedic Surgery

## 2018-08-16 DIAGNOSIS — M25521 Pain in right elbow: Secondary | ICD-10-CM | POA: Insufficient documentation

## 2018-08-16 DIAGNOSIS — Z01812 Encounter for preprocedural laboratory examination: Secondary | ICD-10-CM | POA: Diagnosis present

## 2018-08-16 HISTORY — DX: Migraine, unspecified, not intractable, without status migrainosus: G43.909

## 2018-08-16 LAB — COMPREHENSIVE METABOLIC PANEL
ALT: 67 U/L — ABNORMAL HIGH (ref 0–44)
AST: 41 U/L (ref 15–41)
Albumin: 4 g/dL (ref 3.5–5.0)
Alkaline Phosphatase: 118 U/L (ref 38–126)
Anion gap: 9 (ref 5–15)
BUN: 21 mg/dL — ABNORMAL HIGH (ref 6–20)
CO2: 31 mmol/L (ref 22–32)
Calcium: 10.5 mg/dL — ABNORMAL HIGH (ref 8.9–10.3)
Chloride: 98 mmol/L (ref 98–111)
Creatinine, Ser: 1.04 mg/dL — ABNORMAL HIGH (ref 0.44–1.00)
GFR calc Af Amer: >60 mL/min (ref >60)
GFR calc non Af Amer: >60 mL/min (ref >60)
Glucose, Bld: 127 mg/dL — ABNORMAL HIGH (ref 70–99)
Potassium: 3.8 mmol/L (ref 3.5–5.1)
Sodium: 138 mmol/L (ref 135–145)
Total Bilirubin: 1.3 mg/dL — ABNORMAL HIGH (ref 0.3–1.2)
Total Protein: 7 g/dL (ref 6.5–8.1)

## 2018-08-16 LAB — CBC
HCT: 41.2 % (ref 36.0–46.0)
Hemoglobin: 14.4 g/dL (ref 12.0–15.0)
MCH: 32.3 pg (ref 26.0–34.0)
MCHC: 35 g/dL (ref 30.0–36.0)
MCV: 92.4 fL (ref 80.0–100.0)
Platelets: 229 10*3/uL (ref 150–400)
RBC: 4.46 MIL/uL (ref 3.87–5.11)
RDW: 12.2 % (ref 11.5–15.5)
WBC: 6.2 10*3/uL (ref 4.0–10.5)
nRBC: 0 % (ref 0.0–0.2)

## 2018-08-16 LAB — HCG, SERUM, QUALITATIVE: Preg, Serum: NEGATIVE

## 2018-08-19 ENCOUNTER — Other Ambulatory Visit: Payer: Self-pay

## 2018-08-19 ENCOUNTER — Other Ambulatory Visit (HOSPITAL_COMMUNITY)
Admission: RE | Admit: 2018-08-19 | Discharge: 2018-08-19 | Disposition: A | Discharge status: Home / Self Care | Payer: BC Managed Care – PPO | Source: Ambulatory Visit | Attending: Orthopaedic Surgery | Admitting: Orthopaedic Surgery

## 2018-08-19 DIAGNOSIS — Z1159 Encounter for screening for other viral diseases: Secondary | ICD-10-CM | POA: Diagnosis present

## 2018-08-19 LAB — SARS CORONAVIRUS 2 BY RT PCR (HOSPITAL ORDER, PERFORMED IN ~~LOC~~ HOSPITAL LAB): SARS Coronavirus 2: NEGATIVE

## 2018-08-20 ENCOUNTER — Other Ambulatory Visit: Payer: Self-pay

## 2018-08-20 MED ORDER — DEXTROSE 5 % IV SOLN
3.0000 g | INTRAVENOUS | Status: AC
  Administered 2018-08-21: 11:00:00 3 g via INTRAVENOUS
  Filled 2018-08-20: qty 3

## 2018-08-20 NOTE — Progress Notes (Signed)
SPOKE W/  Dan Maker     SCREENING SYMPTOMS OF COVID 19:   COUGH--no  RUNNY NOSE--- no  SORE THROAT---no  NASAL CONGESTION----no  SNEEZING----no  SHORTNESS OF BREATH---no  DIFFICULTY BREATHING---no  TEMP >100.0 -----no  UNEXPLAINED BODY ACHES------no  CHILLS -------- no  HEADACHES ---------no  LOSS OF SMELL/ TASTE --------no    HAVE YOU OR ANY FAMILY MEMBER TRAVELLED PAST 14 DAYS OUT OF THE   COUNTY---no STATE----no COUNTRY----no HAVE YOU OR ANY FAMILY MEMBER BEEN EXPOSED TO ANYONE WITH COVID 19? no

## 2018-08-21 ENCOUNTER — Ambulatory Visit (HOSPITAL_COMMUNITY): Payer: BC Managed Care – PPO | Admitting: Anesthesiology

## 2018-08-21 ENCOUNTER — Encounter (HOSPITAL_COMMUNITY)
Admission: RE | Disposition: A | Discharge status: Home / Self Care | Payer: Self-pay | Source: Other Acute Inpatient Hospital | Attending: Orthopaedic Surgery

## 2018-08-21 ENCOUNTER — Encounter (HOSPITAL_COMMUNITY): Payer: Self-pay | Admitting: *Deleted

## 2018-08-21 ENCOUNTER — Ambulatory Visit (HOSPITAL_COMMUNITY): Payer: BC Managed Care – PPO | Admitting: Physician Assistant

## 2018-08-21 ENCOUNTER — Ambulatory Visit (HOSPITAL_COMMUNITY)
Admission: RE | Admit: 2018-08-21 | Discharge: 2018-08-21 | Disposition: A | Discharge status: Home / Self Care | Payer: BC Managed Care – PPO | Source: Other Acute Inpatient Hospital | Attending: Orthopaedic Surgery | Admitting: Orthopaedic Surgery

## 2018-08-21 DIAGNOSIS — F419 Anxiety disorder, unspecified: Secondary | ICD-10-CM | POA: Diagnosis not present

## 2018-08-21 DIAGNOSIS — G5622 Lesion of ulnar nerve, left upper limb: Secondary | ICD-10-CM | POA: Diagnosis not present

## 2018-08-21 DIAGNOSIS — S53441A Ulnar collateral ligament sprain of right elbow, initial encounter: Secondary | ICD-10-CM | POA: Diagnosis not present

## 2018-08-21 DIAGNOSIS — Z7984 Long term (current) use of oral hypoglycemic drugs: Secondary | ICD-10-CM | POA: Diagnosis not present

## 2018-08-21 DIAGNOSIS — F329 Major depressive disorder, single episode, unspecified: Secondary | ICD-10-CM | POA: Insufficient documentation

## 2018-08-21 DIAGNOSIS — I10 Essential (primary) hypertension: Secondary | ICD-10-CM | POA: Insufficient documentation

## 2018-08-21 DIAGNOSIS — G4730 Sleep apnea, unspecified: Secondary | ICD-10-CM | POA: Diagnosis not present

## 2018-08-21 DIAGNOSIS — M7711 Lateral epicondylitis, right elbow: Secondary | ICD-10-CM | POA: Diagnosis present

## 2018-08-21 DIAGNOSIS — E119 Type 2 diabetes mellitus without complications: Secondary | ICD-10-CM | POA: Insufficient documentation

## 2018-08-21 DIAGNOSIS — X58XXXA Exposure to other specified factors, initial encounter: Secondary | ICD-10-CM | POA: Insufficient documentation

## 2018-08-21 DIAGNOSIS — G5620 Lesion of ulnar nerve, unspecified upper limb: Secondary | ICD-10-CM

## 2018-08-21 DIAGNOSIS — Z79890 Hormone replacement therapy (postmenopausal): Secondary | ICD-10-CM | POA: Insufficient documentation

## 2018-08-21 DIAGNOSIS — Z6841 Body Mass Index (BMI) 40.0 and over, adult: Secondary | ICD-10-CM | POA: Insufficient documentation

## 2018-08-21 DIAGNOSIS — G43909 Migraine, unspecified, not intractable, without status migrainosus: Secondary | ICD-10-CM | POA: Insufficient documentation

## 2018-08-21 DIAGNOSIS — E039 Hypothyroidism, unspecified: Secondary | ICD-10-CM | POA: Diagnosis not present

## 2018-08-21 DIAGNOSIS — Z79899 Other long term (current) drug therapy: Secondary | ICD-10-CM | POA: Diagnosis not present

## 2018-08-21 HISTORY — PX: ULNAR NERVE TRANSPOSITION: SHX2595

## 2018-08-21 HISTORY — PX: ELBOW LIGAMENT RECONSTRUCTION: SHX6359

## 2018-08-21 HISTORY — PX: TENNIS ELBOW RELEASE/NIRSCHEL PROCEDURE: SHX6651

## 2018-08-21 LAB — GLUCOSE, CAPILLARY: Glucose-Capillary: 149 mg/dL — ABNORMAL HIGH (ref 70–99)

## 2018-08-21 SURGERY — ULNAR NERVE DECOMPRESSION/TRANSPOSITION
Anesthesia: General | Site: Arm Lower | Laterality: Right

## 2018-08-21 MED ORDER — DEXAMETHASONE SODIUM PHOSPHATE 10 MG/ML IJ SOLN
INTRAMUSCULAR | Status: AC
  Filled 2018-08-21: qty 1

## 2018-08-21 MED ORDER — ROPIVACAINE HCL 5 MG/ML IJ SOLN
INTRAMUSCULAR | Status: DC | PRN
  Administered 2018-08-21: 30 mL via PERINEURAL

## 2018-08-21 MED ORDER — BUPIVACAINE HCL (PF) 0.25 % IJ SOLN
INTRAMUSCULAR | Status: AC
  Filled 2018-08-21: qty 30

## 2018-08-21 MED ORDER — MIDAZOLAM HCL 2 MG/2ML IJ SOLN
INTRAMUSCULAR | Status: AC
  Administered 2018-08-21: 2 mg via INTRAVENOUS
  Filled 2018-08-21: qty 2

## 2018-08-21 MED ORDER — FENTANYL CITRATE (PF) 100 MCG/2ML IJ SOLN
INTRAMUSCULAR | Status: AC
  Administered 2018-08-21: 50 ug via INTRAVENOUS
  Filled 2018-08-21: qty 2

## 2018-08-21 MED ORDER — OXYCODONE HCL 5 MG PO TABS
ORAL_TABLET | ORAL | Status: AC
  Filled 2018-08-21: qty 1

## 2018-08-21 MED ORDER — PROPOFOL 10 MG/ML IV BOLUS
INTRAVENOUS | Status: DC | PRN
  Administered 2018-08-21: 200 mg via INTRAVENOUS

## 2018-08-21 MED ORDER — PROPOFOL 10 MG/ML IV BOLUS
INTRAVENOUS | Status: AC
  Filled 2018-08-21: qty 20

## 2018-08-21 MED ORDER — OXYCODONE HCL 5 MG PO TABS: ORAL_TABLET | ORAL | 0 refills | Status: AC

## 2018-08-21 MED ORDER — ONDANSETRON HCL 4 MG PO TABS: 4.0000 mg | ORAL_TABLET | Freq: Three times a day (TID) | ORAL | 1 refills | Status: AC | PRN

## 2018-08-21 MED ORDER — BUPIVACAINE HCL (PF) 0.5 % IJ SOLN
INTRAMUSCULAR | Status: DC | PRN
  Administered 2018-08-21: 10 mL

## 2018-08-21 MED ORDER — ONDANSETRON HCL 4 MG/2ML IJ SOLN
INTRAMUSCULAR | Status: AC
  Filled 2018-08-21: qty 2

## 2018-08-21 MED ORDER — ONDANSETRON HCL 4 MG/2ML IJ SOLN: 4.0000 mg | Freq: Once | INTRAMUSCULAR | Status: DC | PRN

## 2018-08-21 MED ORDER — 0.9 % SODIUM CHLORIDE (POUR BTL) OPTIME
TOPICAL | Status: DC | PRN
  Administered 2018-08-21: 1000 mL

## 2018-08-21 MED ORDER — ACETAMINOPHEN 500 MG PO TABS: 1000.0000 mg | ORAL_TABLET | Freq: Three times a day (TID) | ORAL | 0 refills | Status: AC

## 2018-08-21 MED ORDER — FENTANYL CITRATE (PF) 100 MCG/2ML IJ SOLN
INTRAMUSCULAR | Status: AC
  Filled 2018-08-21: qty 2

## 2018-08-21 MED ORDER — VANCOMYCIN HCL 1000 MG IV SOLR
INTRAVENOUS | Status: DC | PRN
  Administered 2018-08-21: 1000 mg

## 2018-08-21 MED ORDER — FENTANYL CITRATE (PF) 100 MCG/2ML IJ SOLN
50.0000 ug | INTRAMUSCULAR | Status: DC
  Administered 2018-08-21 (×2): 50 ug via INTRAVENOUS

## 2018-08-21 MED ORDER — MIDAZOLAM HCL 2 MG/2ML IJ SOLN
1.0000 mg | INTRAMUSCULAR | Status: DC
  Administered 2018-08-21: 2 mg via INTRAVENOUS

## 2018-08-21 MED ORDER — FENTANYL CITRATE (PF) 100 MCG/2ML IJ SOLN: 25.0000 ug | INTRAMUSCULAR | Status: DC | PRN

## 2018-08-21 MED ORDER — CHLORHEXIDINE GLUCONATE 4 % EX LIQD: 60.0000 mL | Freq: Once | CUTANEOUS | Status: DC

## 2018-08-21 MED ORDER — VANCOMYCIN HCL 1000 MG IV SOLR
INTRAVENOUS | Status: AC
  Filled 2018-08-21: qty 1000

## 2018-08-21 MED ORDER — LACTATED RINGERS IV SOLN
INTRAVENOUS | Status: DC
  Administered 2018-08-21: 09:00:00 via INTRAVENOUS

## 2018-08-21 MED ORDER — DEXAMETHASONE SODIUM PHOSPHATE 10 MG/ML IJ SOLN
INTRAMUSCULAR | Status: DC | PRN
  Administered 2018-08-21: 10 mg via INTRAVENOUS

## 2018-08-21 MED ORDER — ONDANSETRON HCL 4 MG/2ML IJ SOLN
INTRAMUSCULAR | Status: DC | PRN
  Administered 2018-08-21: 4 mg via INTRAVENOUS

## 2018-08-21 MED ORDER — OXYCODONE HCL 5 MG/5ML PO SOLN: 5.0000 mg | Freq: Once | ORAL | Status: AC | PRN

## 2018-08-21 MED ORDER — OXYCODONE HCL 5 MG PO TABS
5.0000 mg | ORAL_TABLET | Freq: Once | ORAL | Status: AC | PRN
  Administered 2018-08-21: 5 mg via ORAL

## 2018-08-21 MED ORDER — FENTANYL CITRATE (PF) 100 MCG/2ML IJ SOLN
INTRAMUSCULAR | Status: DC | PRN
  Administered 2018-08-21 (×2): 50 ug via INTRAVENOUS

## 2018-08-21 SURGICAL SUPPLY — 97 items
ANCHOR SUT FBRTK DX 1 (Anchor) ×3 IMPLANT
BANDAGE ACE 4X5 VEL STRL LF (GAUZE/BANDAGES/DRESSINGS) ×9 IMPLANT
BENZOIN TINCTURE PRP APPL 2/3 (GAUZE/BANDAGES/DRESSINGS) ×3 IMPLANT
BLADE HEX COATED 2.75 (ELECTRODE) ×3 IMPLANT
BLADE SURG 15 STRL LF DISP TIS (BLADE) ×6 IMPLANT
BLADE SURG 15 STRL SS (BLADE) ×3
BLADE SURG SZ10 CARB STEEL (BLADE) ×3 IMPLANT
BNDG COHESIVE 4X5 TAN STRL (GAUZE/BANDAGES/DRESSINGS) ×3 IMPLANT
BNDG ESMARK 4X9 LF (GAUZE/BANDAGES/DRESSINGS) ×3 IMPLANT
BRUSH SCRUB EZ PLAIN DRY (MISCELLANEOUS) ×3 IMPLANT
BUR EGG ELITE 4.0 (BURR) ×3 IMPLANT
CANISTER SUCT 3000ML PPV (MISCELLANEOUS) IMPLANT
CHLORAPREP W/TINT 26 (MISCELLANEOUS) ×3 IMPLANT
CLSR STERI-STRIP ANTIMIC 1/2X4 (GAUZE/BANDAGES/DRESSINGS) ×3 IMPLANT
CORD BIPOLAR FORCEPS 12FT (ELECTRODE) ×3 IMPLANT
COVER BACK TABLE 60X90IN (DRAPES) ×6 IMPLANT
COVER WAND RF STERILE (DRAPES) IMPLANT
CUFF TOURN SGL QUICK 18X4 (TOURNIQUET CUFF) ×6 IMPLANT
CUFF TOURN SGL QUICK 24 (TOURNIQUET CUFF) ×1
CUFF TOURN SGL QUICK 34 (TOURNIQUET CUFF) ×1
CUFF TRNQT CYL 24X4X16.5-23 (TOURNIQUET CUFF) ×2 IMPLANT
CUFF TRNQT CYL 34X4.125X (TOURNIQUET CUFF) ×2 IMPLANT
DECANTER SPIKE VIAL GLASS SM (MISCELLANEOUS) IMPLANT
DRAIN PENROSE 18X1/2 LTX STRL (DRAIN) ×3 IMPLANT
DRAPE EXTREMITY T 121X128X90 (DISPOSABLE) ×3 IMPLANT
DRAPE IMP U-DRAPE 54X76 (DRAPES) ×3 IMPLANT
DRAPE INCISE IOBAN 66X45 STRL (DRAPES) IMPLANT
DRAPE OEC MINIVIEW 54X84 (DRAPES) ×3 IMPLANT
DRAPE SURG 17X23 STRL (DRAPES) ×3 IMPLANT
DRAPE U-SHAPE 47X51 STRL (DRAPES) ×3 IMPLANT
DRSG PAD ABDOMINAL 8X10 ST (GAUZE/BANDAGES/DRESSINGS) ×6 IMPLANT
DRSG TEGADERM 4X4.75 (GAUZE/BANDAGES/DRESSINGS) ×3 IMPLANT
Disposable Kit for FiberTak DX Suture Anchor ×3 IMPLANT
ELECT PENCIL ROCKER SW 15FT (MISCELLANEOUS) ×3 IMPLANT
ELECT REM PT RETURN 15FT ADLT (MISCELLANEOUS) ×3 IMPLANT
FIBERTAK DX SUTURE ANCHOR ×3 IMPLANT
GAUZE SPONGE 4X4 12PLY STRL (GAUZE/BANDAGES/DRESSINGS) ×3 IMPLANT
GAUZE XEROFORM 1X8 LF (GAUZE/BANDAGES/DRESSINGS) ×3 IMPLANT
GAUZE XEROFORM 5X9 LF (GAUZE/BANDAGES/DRESSINGS) IMPLANT
GLOVE BIO SURGEON STRL SZ7 (GLOVE) ×3 IMPLANT
GLOVE BIO SURGEON STRL SZ7.5 (GLOVE) ×3 IMPLANT
GLOVE BIOGEL PI IND STRL 7.0 (GLOVE) ×2 IMPLANT
GLOVE BIOGEL PI IND STRL 8 (GLOVE) ×2 IMPLANT
GLOVE BIOGEL PI INDICATOR 7.0 (GLOVE) ×1
GLOVE BIOGEL PI INDICATOR 8 (GLOVE) ×1
GLOVE ECLIPSE 8.0 STRL XLNG CF (GLOVE) ×3 IMPLANT
GOWN STRL REUS W/ TWL LRG LVL3 (GOWN DISPOSABLE) ×2 IMPLANT
GOWN STRL REUS W/ TWL XL LVL3 (GOWN DISPOSABLE) ×2 IMPLANT
GOWN STRL REUS W/TWL LRG LVL3 (GOWN DISPOSABLE) ×1
GOWN STRL REUS W/TWL XL LVL3 (GOWN DISPOSABLE) ×4 IMPLANT
KIT BASIN OR (CUSTOM PROCEDURE TRAY) ×3 IMPLANT
KIT FIBERTAK DX 1.6 DISP (KITS) ×3 IMPLANT
LOOP VESSEL MAXI BLUE (MISCELLANEOUS) ×3 IMPLANT
NEEDLE HYPO 25X1 1.5 SAFETY (NEEDLE) IMPLANT
NS IRRIG 1000ML POUR BTL (IV SOLUTION) ×3 IMPLANT
PACK ARTHROSCOPY DSU (CUSTOM PROCEDURE TRAY) ×3 IMPLANT
PAD CAST 4YDX4 CTTN HI CHSV (CAST SUPPLIES) ×4 IMPLANT
PADDING CAST ABS 4INX4YD NS (CAST SUPPLIES) ×1
PADDING CAST ABS COTTON 4X4 ST (CAST SUPPLIES) ×2 IMPLANT
PADDING CAST COTTON 4X4 STRL (CAST SUPPLIES) ×2
RETRIEVER SUT HEWSON (MISCELLANEOUS) ×3 IMPLANT
SLEEVE SCD COMPRESS KNEE MED (MISCELLANEOUS) IMPLANT
SLING ARM FOAM STRAP LRG (SOFTGOODS) ×3 IMPLANT
SLING ARM FOAM STRAP MED (SOFTGOODS) IMPLANT
SLING ARM FOAM STRAP XLG (SOFTGOODS) IMPLANT
SLING ARM IMMOBILIZER LRG (SOFTGOODS) IMPLANT
SLING ARM IMMOBILIZER MED (SOFTGOODS) IMPLANT
SPLINT PLASTER CAST XFAST 5X30 (CAST SUPPLIES) ×4 IMPLANT
SPLINT PLASTER XFAST SET 5X30 (CAST SUPPLIES) ×2
SPONGE LAP 18X18 RF (DISPOSABLE) ×3 IMPLANT
STOCKINETTE 4X48 STRL (DRAPES) ×3 IMPLANT
STOCKINETTE 8 INCH (MISCELLANEOUS) ×3 IMPLANT
STRIP CLOSURE SKIN 1/2X4 (GAUZE/BANDAGES/DRESSINGS) ×3 IMPLANT
SUCTION FRAZIER HANDLE 10FR (MISCELLANEOUS) ×2
SUCTION TUBE FRAZIER 10FR DISP (MISCELLANEOUS) ×4 IMPLANT
SUT ETHILON 3 0 PS 1 (SUTURE) IMPLANT
SUT FIBERWIRE #2 38 REV NDL BL (SUTURE)
SUT MNCRL AB 4-0 PS2 18 (SUTURE) ×6 IMPLANT
SUT TIGER TAPE 7 IN WHITE (SUTURE) IMPLANT
SUT VIC AB 0 CT1 27 (SUTURE) ×2
SUT VIC AB 0 CT1 27XBRD ANBCTR (SUTURE) ×4 IMPLANT
SUT VIC AB 2-0 SH 27 (SUTURE) ×1
SUT VIC AB 2-0 SH 27XBRD (SUTURE) ×2 IMPLANT
SUT VIC AB 3-0 SH 27 (SUTURE)
SUT VIC AB 3-0 SH 27X BRD (SUTURE) IMPLANT
SUTURE FIBERWR#2 38 REV NDL BL (SUTURE) IMPLANT
SUTURE TAPE 1.3 FIBERLOP 20 ST (SUTURE) IMPLANT
SUTURETAPE 1.3 FIBERLOOP 20 ST (SUTURE)
SYR BULB 3OZ (MISCELLANEOUS) ×3 IMPLANT
SYR CONTROL 10ML LL (SYRINGE) IMPLANT
TAPE FIBER 2MM 7IN #2 BLUE (SUTURE) IMPLANT
TOWEL SURG RFD BLUE STRL DISP (DISPOSABLE) ×6 IMPLANT
TRAY PREP A LATEX SAFE STRL (SET/KITS/TRAYS/PACK) ×3 IMPLANT
TUBE SUCTION HIGH CAP CLEAR NV (SUCTIONS) ×3 IMPLANT
TUBING CONNECTING 10 (TUBING) ×3 IMPLANT
UNDERPAD 30X30 (UNDERPADS AND DIAPERS) ×3 IMPLANT
YANKAUER SUCT BULB TIP NO VENT (SUCTIONS) ×3 IMPLANT

## 2018-08-21 NOTE — Anesthesia Preprocedure Evaluation (Addendum)
Anesthesia Evaluation  Patient identified by MRN, date of birth, ID band Patient awake    Reviewed: Allergy & Precautions, NPO status , Patient's Chart, lab work & pertinent test results  History of Anesthesia Complications Negative for: history of anesthetic complications  Airway Mallampati: II  TM Distance: >3 FB Neck ROM: Full    Dental no notable dental hx.    Pulmonary sleep apnea ,    Pulmonary exam normal        Cardiovascular hypertension, Normal cardiovascular exam     Neuro/Psych  Headaches, PSYCHIATRIC DISORDERS Anxiety Depression    GI/Hepatic GERD  ,(+) Hepatitis - (NASH)  Endo/Other  diabetesHypothyroidism Morbid obesity  Renal/GU negative Renal ROS  negative genitourinary   Musculoskeletal negative musculoskeletal ROS (+)   Abdominal   Peds  Hematology negative hematology ROS (+)   Anesthesia Other Findings   Reproductive/Obstetrics                            Anesthesia Physical Anesthesia Plan  ASA: III  Anesthesia Plan: General   Post-op Pain Management:  Regional for Post-op pain   Induction:   PONV Risk Score and Plan: 3 and Ondansetron, Dexamethasone, Treatment may vary due to age or medical condition and Midazolam  Airway Management Planned: LMA  Additional Equipment: None  Intra-op Plan:   Post-operative Plan: Extubation in OR  Informed Consent: I have reviewed the patients History and Physical, chart, labs and discussed the procedure including the risks, benefits and alternatives for the proposed anesthesia with the patient or authorized representative who has indicated his/her understanding and acceptance.     Dental advisory given  Plan Discussed with:   Anesthesia Plan Comments:        Anesthesia Quick Evaluation

## 2018-08-21 NOTE — Anesthesia Procedure Notes (Signed)
Procedure Name: LMA Insertion Date/Time: 08/21/2018 11:19 AM Performed by: Maxwell Caul, CRNA Pre-anesthesia Checklist: Patient identified, Emergency Drugs available, Suction available and Patient being monitored Patient Re-evaluated:Patient Re-evaluated prior to induction Oxygen Delivery Method: Circle system utilized Preoxygenation: Pre-oxygenation with 100% oxygen LMA: LMA inserted LMA Size: 4.0 Number of attempts: 1 Placement Confirmation: positive ETCO2 and breath sounds checked- equal and bilateral Tube secured with: Tape Dental Injury: Teeth and Oropharynx as per pre-operative assessment

## 2018-08-21 NOTE — Op Note (Signed)
Orthopaedic Surgery Operative Note (CSN: [id number])  Melanie [name]  1975/10/19 Date of Surgery: 08/21/2018   Diagnoses:  RIGHT TENNIS ELBOW, TEAR LCL, ULNAR NERVE LESION  Procedure: Right lateral epicondylitis debridement with common extensor repair Right lateral ulnar collateral ligament repair Right cubital tunnel release with transposition of the ulnar nerve subfascial   Operative Finding Successful completion of planned procedure.  Patient's ulnar nerve was clearly under significant tension in the cubital tunnel and clearly had a wasted appearance consistent with chronic compression.  We had it freed completely proximally and distally and were able to do a tension-free sling with fascial tissue.  The lateral repair was routine.  There are significant degenerative change of the common extensor and it was repaired after debridement.  Lateral ligament was repaired with a single anchor.  Post-operative plan: The patient will be nonweightbearing in a splint for a week and then transition to a brace with a 1 pound weight limit.  The patient will be discharged home.  DVT prophylaxis not indicated in amatory upper extremity patient.  Pain control with PRN pain medication preferring oral medicines.  Follow up plan will be scheduled in approximately 7 days for incision check.  Post-Op Diagnosis: Same Surgeons:Primary: Bjorn Pippin, MD Assistants: Janace Litten, OPAC Location: [facility] ROOM 06 Anesthesia: General with a regional block Antibiotics: Ancef 3g, Vancomycin 1000mg  locally Tourniquet time: 1 hour Estimated Blood Loss: Minimal Complications: None Specimens: None Implants: Implant Name Type Inv. Item Serial No. Manufacturer Lot No. LRB No. Used  FiberTak DX Suture Anchor    [employer] [id number] Right 1    Indications for Surgery:   Melanie [name] is a 43 y.o. female with chronic elbow pain both in the medial and lateral side.  She had numbness in her small and ring finger and her  primary complaint was pain in the lateral epicondyle.  MRI demonstrated a rupture of the common extensor tendons and the lateral ulnar collateral ligament.  She failed nonoperative measures for extended period of time including night splinting, injections on the lateral side of the elbow as well as therapy.  She is debilitated by her pain.  Benefits and risks of operative and nonoperative management were discussed prior to surgery with patient/guardian(s) and informed consent form was completed.  Specific risks including infection, need for additional surgery, continued pain, stiffness, wound healing issues.   Procedure:   The patient was identified in the preoperative holding area where the surgical site was marked. The patient was taken to the OR where a procedural timeout was called and the above noted anesthesia was induced.  The patient was positioned supine on a hand table.  Preoperative antibiotics were dosed.  The patient's right elbow was prepped and draped in the usual sterile fashion.  A second preoperative timeout was called.      A tourniquet was used for the above listed time.  We began by identifying the medial epicondyle making a longitudinal incision about 10 cm in length this patient's super morbid obesity required a larger incision.  We dissected through the subcutaneous adipose tissue bluntly identifying and protecting the medial antebrachial cutaneous nerve branches.  We were able to identify the medial epicondyle at that point and posterior to that the ulnar nerve as it tracked through the cubital tunnel.  We identified the nerve proximally and released it proximally approximately 10 cm above the medial epicondyle.  Once this was free we then retracted distally and were able to release it through the  cubital tunnel.  The significant wasting of the nerve as has been compressed by the cubital tunnel itself.  We are able to identify the motor branch as well as the branch to the joint and  we did sacrifice the branch to the joint as it was tethering the nerve but the motor branch was freed and we are able to clearly transpose the nerve without issue.  The nerve did not seem stable within its bed and transposition was thought to be better for this patient.  We that point resected a portion of the medial septum just proximal to medial epicondyle to ensure that there was not an area of tension for the nerve.  This point we were able to mobilize the nerve and lead into a fascial sling treated from the overlying fascia of the medial flexors.  We took care to transect the raphae that was within the muscle to avoid any compression of the nerve.  Nerve was free through range of motion the elbow.  Extensive irrigation was performed at this point and local vancomycin powder was placed.  Incision closed with absorbable suture.  We turned our attention to the lateral aspect of the elbow.  We used the technique described by Nirschl et al we.  Made a 4 cm incision starting 1 cm proximal to the lateral epicondyle extending distally in line with the arm dissected through the skin sharply achieving hemostasis as we progressed.  Soft tissues were dissected bluntly with a Metzenbaum scissor and we were able to expose the extensor aponeurosis and the lateral epicondyle within our incision.  Self-retaining retractors were placed and we carefully identified the proximal and distal aspect of the epicondyle and the midportion of the extensor aponeurosis as well as the muscle belly anterior.  The extensor aponeurosis was opened in line with its fibers longitudinally along the incision with full-thickness initially and then we took care to elevate the extensor aponeurosis from the underlying ECRB and EDC tendons.  We then identified the unhealthy appearing discolored tissue consistent with the ECRB rupture and chronic degenerative change and sharply excised this with a combination of 15 blade and rongeur.  We carefully  examined  Incisional bed was inspected and noted that the joint itself was exposed as is typical in that the posterior lateral collateral ligament structures were also avulsed and clearly the joint was opening up to a varus stress.  At this point curette and rondure were used to clear the anterior portion of the epicondyle which is the insertion of the ECRB/EDC tendons and expose the bone to promote healing of tendon to bone.  We then placed a 1.6 mm Arthrex fiber tack suture and ran a Krakw type locking stitch through the lateral ulnar collateral ligament posteriorly and the extensor aponeurosis anteriorly And the remaining tendon and tied this to the bone pulling on the alternate limits the post.  We buried the knot to avoid and not being prominent.  Were happy with our final repair and then closed the longitudinal split in the extensor aponeurosis with running Vicryl.  The incision was thoroughly irrigated and closed in a multilayer fashion with absorbable suture in the skin.  A sterile dressing was placed as well as a splint and sling.  The patient was awoken from general anesthesia and taken to the PACU in stable condition without complication.   Janace Litten, OPA-C, present and scrubbed throughout the case, critical for completion in a timely fashion, and for retraction, instrumentation, closure.

## 2018-08-21 NOTE — Anesthesia Procedure Notes (Addendum)
Anesthesia Regional Block: Supraclavicular block   Pre-Anesthetic Checklist: ,, timeout performed, Correct Patient, Correct Site, Correct Laterality, Correct Procedure, Correct Position, site marked, Risks and benefits discussed,  Surgical consent,  Pre-op evaluation,  At surgeon's request and post-op pain management  Laterality: Right  Prep: chloraprep       Needles:  Injection technique: Single-shot  Needle Type: Echogenic Stimulator Needle     Needle Length: 9cm  Needle Gauge: 21     Additional Needles:   Procedures:,,,, ultrasound used (permanent image in chart),,,,  Narrative:  Start time: 08/21/2018 10:03 AM End time: 08/21/2018 10:07 AM Injection made incrementally with aspirations every 5 mL.  Performed by: Personally  Anesthesiologist: Lidia Collum, MD  Additional Notes: Monitors applied. Injection made in 5cc increments. No resistance to injection. Good needle visualization. Patient tolerated procedure well.  Additional 10 cc infiltrated in axilla using 25g needle for intercostobrachial nerve block.

## 2018-08-21 NOTE — Anesthesia Postprocedure Evaluation (Signed)
Anesthesia Post Note  Patient: REBAKAH COKLEY  Procedure(s) Performed: ULNAR NERVE DECOMPRESSION/TRANSPOSITION (Right ) LATERAL COLLATERAL LIGAMENT REPAIR (Right Arm Lower) TENNIS ELBOW RELEASE/NIRSCHEL PROCEDURE (Right Arm Lower)     Patient location during evaluation: PACU Anesthesia Type: General Level of consciousness: awake and alert Pain management: pain level controlled Vital Signs Assessment: post-procedure vital signs reviewed and stable Respiratory status: spontaneous breathing, nonlabored ventilation and respiratory function stable Cardiovascular status: blood pressure returned to baseline and stable Postop Assessment: no apparent nausea or vomiting Anesthetic complications: no    Last Vitals:  Vitals:   08/21/18 1400 08/21/18 1415  BP: (!) 145/81 (!) 142/80  Pulse: 75 76  Resp: 17 16  Temp:  36.7 C  SpO2: 99% 91%    Last Pain:  Vitals:   08/21/18 1500  TempSrc:   PainSc: Asleep                 Lidia Collum

## 2018-08-21 NOTE — Progress Notes (Signed)
Assisted Dr. Christella Hartigan with right, ultrasound guided, supraclavicular block. Side rails up, monitors on throughout procedure. See vital signs in flow sheet. Tolerated Procedure well.

## 2018-08-21 NOTE — H&P (Signed)
PREOPERATIVE H&P  Chief Complaint: RIGHT TENNIS ELBOW, TEAR LCL, ULNAR NERVE LESION  HPI: Melanie [name] is a 43 y.o. female who presents for preoperative history and physical with a diagnosis of RIGHT TENNIS ELBOW, TEAR LCL, ULNAR NERVE LESION. Symptoms are rated as moderate to severe, and have been worsening.  This is significantly impairing activities of daily living.  Please see my clinic note for full details on this patient's care.  She has elected for surgical management.   Past Medical History:  Diagnosis Date  . Allergy   . Anemia   . Anxiety   . Back pain   . Constipation   . Depression   . Fatty liver   . GERD (gastroesophageal reflux disease)   . Hyperlipidemia   . Infertility, female   . Joint pain   . Kidney problem   . 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 Migraines    LIGHT SENSITIVITY , NOW ON IMITREX SINCE 08-02-2018,NOTICES IMPROVEMENT   . Multiple food allergies   . Muscle pain   . Numbness and tingling in both hands   . Pre-diabetes   . Shortness of breath    was due to a medication side effect, now off medication , SOB resolved   . Sleep apnea   . Stress   . Swelling of both lower extremities    Past Surgical History:  Procedure Laterality Date  . FINGER SURGERY Left     index finger  . KNEE SURGERY Left   . LIVER BIOPSY N/A 05/15/2014   Procedure: LIVER BIOPSY;  Surgeon: Inda Castle, MD;  Location: [facility];  Service: Endoscopy;  Laterality: N/A;  ultrasound to mark the liver  . TONSILLECTOMY AND ADENOIDECTOMY  1978  . TYMPANOSTOMY TUBE PLACEMENT    . wrist cyst Left    Social History   Socioeconomic History  . Marital status: Married    Spouse name: [name]  . Number of children: 5  . Years of education: in [employer] program  . Highest education level: Bachelor's degree (e.g., BA, AB, BS)  Occupational History  . Occupation: Product manager: [employer]    Comment: ESL  Social Needs  . Financial  resource strain: Not on file  . Food insecurity:    Worry: Not on file    Inability: Not on file  . Transportation needs:    Medical: Not on file    Non-medical: Not on file  Tobacco Use  . Smoking status: Never Smoker  . Smokeless tobacco: Never Used  Substance and Sexual Activity  . Alcohol use: No    Alcohol/week: 0.0 standard drinks  . Drug use: No  . Sexual activity: Yes    Partners: Male    Birth control/protection: Post-menopausal  Lifestyle  . Physical activity:    Days per week: Not on file    Minutes per session: Not on file  . Stress: Not on file  Relationships  . Social connections:    Talks on phone: Not on file    Gets together: Not on file    Attends religious service: Not on file    Active member of club or organization: Not on file    Attends meetings of clubs or organizations: Not on file    Relationship status: Not on file  Other Topics Concern  . Not on file  Social History Narrative   Lives with her husband and their 5 adpoted children.  Adopted 2 sets of siblings, all with psychiatric diagnoses (ADHD, Asperger's, ODD, PTSD, depression, bipolar disorder).   Her husband has Asperger's, and works in disability services.   Occasionally drinks coke or pepsi    Family History  Problem Relation Age of Onset  . Diabetes Mother   . Liver disease Mother        ESLD- unknown etiology  . Obesity Mother   . Diabetes Maternal Grandmother   . Diabetes Paternal Grandmother   . Breast cancer Paternal Grandmother   . Stroke Maternal Grandfather   . Hyperlipidemia Father    Allergies  Allergen Reactions  . Lidocaine Other (See Comments)    Flu like sx's  . Relafen [Nabumetone] Other (See Comments)    migraine  . Celebrex [Celecoxib] Rash  . Sulfa Antibiotics Rash   Prior to Admission medications   Medication Sig Start Date End Date Taking? Authorizing Provider  buPROPion (WELLBUTRIN SR) 150 MG 12 hr tablet Take 1 tablet (150 mg total) by mouth 2 (two)  times daily. 07/17/18  Yes Abby Potash, PA-C  busPIRone (BUSPAR) 15 MG tablet TAKE ONE TABLET BY MOUTH TWICE A DAY OR AS DIRECTED. Patient taking differently: Take 15 mg by mouth 2 (two) times daily. TAKE ONE TABLET BY MOUTH TWICE A DAY OR AS DIRECTED. 04/06/18  Yes Stallings, Zoe A, MD  cetirizine (ZYRTEC) 10 MG tablet Take 10 mg by mouth at bedtime.    Yes [provider]  chlorthalidone (HYGROTON) 25 MG tablet Take 1 tablet (25 mg total) by mouth daily. 07/17/18  Yes Abby Potash, PA-C  citalopram (CELEXA) 40 MG tablet Take 1 tablet (40 mg total) by mouth daily. 07/09/18  Yes Stallings, Zoe A, MD  fluticasone (FLONASE) 50 MCG/ACT nasal spray Place 2 sprays into both nostrils daily. Patient taking differently: Place 2 sprays into both nostrils daily as needed for allergies.  03/30/17  Yes Harrison Mons, PA  levothyroxine (SYNTHROID) 88 MCG tablet Take 1 tablet (88 mcg total) by mouth daily. Patient taking differently: Take 88 mcg by mouth daily before breakfast.  07/09/18  Yes Stallings, Zoe A, MD  LORazepam (ATIVAN) 1 MG tablet Take 1 tablet (1 mg total) by mouth 2 (two) times daily as needed for anxiety. 04/06/18  Yes Forrest Moron, MD  meclizine (ANTIVERT) 25 MG tablet TAKE ONE TABLET BY MOUTH THREE TIMES A DAY AS NEEDED FOR DIZZINESS Patient taking differently: Take 25 mg by mouth 3 (three) times daily as needed for dizziness.  08/14/18  Yes Wendie Agreste, MD  meloxicam (MOBIC) 15 MG tablet Take 15 mg by mouth daily as needed for pain.  07/23/18  Yes [provider]  metFORMIN (GLUCOPHAGE) 500 MG tablet Take 1 tablet (500 mg total) by mouth daily with breakfast. 07/17/18  Yes Abby Potash, PA-C  montelukast (SINGULAIR) 10 MG tablet Take 1 tablet (10 mg total) by mouth at bedtime. 06/11/18  Yes Forrest Moron, MD  Multiple Vitamin (MULTIVITAMIN) tablet Take 1 tablet by mouth daily.   Yes [provider]  Omega-3 Fatty Acids (FISH OIL PO) Take 1 capsule by mouth  2 (two) times a day.    Yes [provider]  SUMAtriptan (IMITREX) 50 MG tablet TAKE ONE TABLET BY MOUTH AT ONSET OF HEADACHE; MAY REPEAT ONE TABLET IN 2 HOURS IF NEEDED. Patient taking differently: Take 50 mg by mouth See admin instructions. Take 1 tablet at onset of headache; may repeat one tablet in 2 hours if needed. 08/14/18  Yes  Forrest Moron, MD  Vitamin D, Ergocalciferol, (DRISDOL) 1.25 MG (50000 UT) CAPS capsule TAKE ONE CAPSULE BY MOUTH TWICE WEEKLY Patient taking differently: Take 50,000 Units by mouth 2 (two) times a week. Tuesday and Friday 07/17/18  Yes Abby Potash, PA-C  Ubrogepant (UBRELVY) 50 MG TABS Take 50 mg by mouth as needed (may repeat once after 2 hours). 08/15/18   Star Age, MD     Positive ROS: All other systems have been reviewed and were otherwise negative with the exception of those mentioned in the HPI and as above.  Physical Exam: General: Alert, no acute distress Cardiovascular: No pedal edema Respiratory: No cyanosis, no use of accessory musculature GI: No organomegaly, abdomen is soft and non-tender Skin: No lesions in the area of chief complaint Neurologic: Sensation intact distally Psychiatric: Patient is competent for consent with normal mood and affect Lymphatic: No axillary or cervical lymphadenopathy  MUSCULOSKELETAL: R elbow painful medially and laterally, wwp arm  Assessment: RIGHT TENNIS ELBOW, TEAR LCL, ULNAR NERVE LESION  Plan: Plan for Procedure(s): ULNAR NERVE DECOMPRESSION/TRANSPOSITION LATERAL COLLATERAL LIGAMENT REPAIR TENNIS ELBOW RELEASE/NIRSCHEL PROCEDURE  The risks benefits and alternatives were discussed with the patient including but not limited to the risks of nonoperative treatment, versus surgical intervention including infection, bleeding, nerve injury,  blood clots, cardiopulmonary complications, morbidity, mortality, among others, and they were willing to proceed.   Hiram Gash, MD  08/21/2018 10:44  AM

## 2018-08-21 NOTE — Transfer of Care (Signed)
Immediate Anesthesia Transfer of Care Note  Patient: Melanie [name]  Procedure(s) Performed: ULNAR NERVE DECOMPRESSION/TRANSPOSITION (Right ) LATERAL COLLATERAL LIGAMENT REPAIR (Right Arm Lower) TENNIS ELBOW RELEASE/NIRSCHEL PROCEDURE (Right Arm Lower)  Patient Location: PACU  Anesthesia Type:General and GA combined with regional for post-op pain  Level of Consciousness: awake, alert  and oriented  Airway & Oxygen Therapy: Patient Spontanous Breathing and Patient connected to face mask oxygen  Post-op Assessment: Report given to RN and Post -op Vital signs reviewed and stable  Post vital signs: Reviewed and stable  Last Vitals:  Vitals Value Taken Time  BP 147/56 08/21/2018  1:00 PM  Temp    Pulse 73 08/21/2018  1:01 PM  Resp 18 08/21/2018  1:01 PM  SpO2 98 % 08/21/2018  1:01 PM  Vitals shown include unvalidated device data.  Last Pain:  Vitals:   08/21/18 0910  TempSrc: Oral         Complications: No apparent anesthesia complications

## 2018-08-28 ENCOUNTER — Encounter (HOSPITAL_COMMUNITY): Payer: Self-pay | Admitting: Orthopaedic Surgery

## 2018-08-31 ENCOUNTER — Other Ambulatory Visit (INDEPENDENT_AMBULATORY_CARE_PROVIDER_SITE_OTHER): Payer: Self-pay | Admitting: Physician Assistant

## 2018-08-31 DIAGNOSIS — I10 Essential (primary) hypertension: Secondary | ICD-10-CM

## 2018-09-03 ENCOUNTER — Telehealth (INDEPENDENT_AMBULATORY_CARE_PROVIDER_SITE_OTHER): Payer: BC Managed Care – PPO | Admitting: Physician Assistant

## 2018-09-03 ENCOUNTER — Encounter (INDEPENDENT_AMBULATORY_CARE_PROVIDER_SITE_OTHER): Payer: Self-pay | Admitting: Physician Assistant

## 2018-09-03 ENCOUNTER — Other Ambulatory Visit: Payer: Self-pay

## 2018-09-03 DIAGNOSIS — Z6841 Body Mass Index (BMI) 40.0 and over, adult: Secondary | ICD-10-CM | POA: Diagnosis not present

## 2018-09-03 DIAGNOSIS — R7303 Prediabetes: Secondary | ICD-10-CM | POA: Diagnosis not present

## 2018-09-03 DIAGNOSIS — E559 Vitamin D deficiency, unspecified: Secondary | ICD-10-CM

## 2018-09-04 ENCOUNTER — Other Ambulatory Visit: Payer: BC Managed Care – PPO

## 2018-09-05 MED ORDER — VITAMIN D (ERGOCALCIFEROL) 1.25 MG (50000 UNIT) PO CAPS: 50000.0000 [IU] | ORAL_CAPSULE | ORAL | 0 refills | Status: DC

## 2018-09-05 NOTE — Progress Notes (Signed)
Office: [phone]  /  Fax: [phone] TeleHealth Visit:  Melanie [name] has verbally consented to this TeleHealth visit today. The patient is located at home, the provider is located at the [employer] [facility] office. The participants in this visit include the listed provider and patient. The visit was conducted today via Webex.  HPI:   Chief Complaint: OBESITY Melanie [name] is here to discuss her progress with her obesity treatment plan. She is on the Category 3 plan and is following her eating plan approximately 60% of the time. She states she is exercising 0 minutes 0 times per week. Melanie [name] just had surgery and was not eating on plan due to pain and intermittent nausea. She is eating as much protein as she can get in despite decreased appetite.  We were unable to weigh the patient today for this TeleHealth visit. She is unsure if she has lost or gained weight since her last visit. She has lost 11 lbs since starting treatment with [place].  Vitamin D deficiency Melanie [name] has a diagnosis of Vitamin D deficiency. She is currently taking prescription Vit D and denies nausea, vomiting or muscle weakness.  Pre-Diabetes Melanie [name] has a diagnosis of prediabetes based on her elevated Hgb A1c and was informed this puts her at greater risk of developing diabetes. She is taking metformin currently and continues to work on diet and exercise to decrease risk of diabetes. She denies nausea, vomiting, or diarrhea. No polyphagia.  ASSESSMENT AND PLAN:  No diagnosis found.  PLAN:  Vitamin D Deficiency Melanie [name] was informed that low Vitamin D levels contributes to fatigue and are associated with obesity, breast, and colon cancer. She agrees to continue to take prescription Vit D @ 50,000 IU twice weekly #10 with 0 refills and will follow-up for routine testing of Vitamin D, at least 2-3 times per year. She was informed of the risk of over-replacement of Vitamin D and agrees to not increase her dose unless she discusses  this with [place] first. Barbarajean agrees to follow-up with our clinic in 2 weeks.  Pre-Diabetes Melanie [name] will continue to work on weight loss, exercise, and decreasing simple carbohydrates in her diet to help decrease the risk of diabetes. We dicussed metformin including benefits and risks. She was informed that eating too many simple carbohydrates or too many calories at one sitting increases the likelihood of GI side effects. Melanie [name] will continue metformin, weight loss, and will follow-up with [place] as directed to monitor her progress.  Obesity Melanie [name] is currently in the action stage of change. As such, her goal is to continue with weight loss efforts. She has agreed to follow the Category 3 plan. Melanie [name] has been instructed to work up to a goal of 150 minutes of combined cardio and strengthening exercise per week for weight loss and overall health benefits. We discussed the following Behavioral Modification Strategies today: work on meal planning, easy cooking plans, and keeping healthy foods in the home.  Melanie [name] has agreed to follow-up with our clinic in 2 weeks. She was informed of the importance of frequent follow-up visits to maximize her success with intensive lifestyle modifications for her multiple health conditions.  ALLERGIES: Allergies  Allergen Reactions   Lidocaine Other (See Comments)    Flu like sx's   Relafen [Nabumetone] Other (See Comments)    migraine   Celebrex [Celecoxib] Rash   Sulfa Antibiotics Rash    MEDICATIONS: Current Outpatient Medications on File Prior to Visit  Medication Sig Dispense Refill   buPROPion (  WELLBUTRIN SR) 150 MG 12 hr tablet Take 1 tablet (150 mg total) by mouth 2 (two) times daily. 180 tablet 0   busPIRone (BUSPAR) 15 MG tablet TAKE ONE TABLET BY MOUTH TWICE A DAY OR AS DIRECTED. (Patient taking differently: Take 15 mg by mouth 2 (two) times daily. TAKE ONE TABLET BY MOUTH TWICE A DAY OR AS DIRECTED.) 180 tablet 3   cetirizine (ZYRTEC) 10 MG tablet Take 10  mg by mouth at bedtime.      chlorthalidone (HYGROTON) 25 MG tablet Take 1 tablet (25 mg total) by mouth daily. 90 tablet 0   citalopram (CELEXA) 40 MG tablet Take 1 tablet (40 mg total) by mouth daily. 90 tablet 1   fluticasone (FLONASE) 50 MCG/ACT nasal spray Place 2 sprays into both nostrils daily. (Patient taking differently: Place 2 sprays into both nostrils daily as needed for allergies. ) 48 g 3   levothyroxine (SYNTHROID) 88 MCG tablet Take 1 tablet (88 mcg total) by mouth daily. (Patient taking differently: Take 88 mcg by mouth daily before breakfast. ) 90 tablet 0   LORazepam (ATIVAN) 1 MG tablet Take 1 tablet (1 mg total) by mouth 2 (two) times daily as needed for anxiety. 20 tablet 0   meclizine (ANTIVERT) 25 MG tablet TAKE ONE TABLET BY MOUTH THREE TIMES A DAY AS NEEDED FOR DIZZINESS (Patient taking differently: Take 25 mg by mouth 3 (three) times daily as needed for dizziness. ) 30 tablet 0   meloxicam (MOBIC) 15 MG tablet Take 15 mg by mouth daily as needed for pain.      metFORMIN (GLUCOPHAGE) 500 MG tablet Take 1 tablet (500 mg total) by mouth daily with breakfast. 90 tablet 0   montelukast (SINGULAIR) 10 MG tablet Take 1 tablet (10 mg total) by mouth at bedtime. 30 tablet 11   Multiple Vitamin (MULTIVITAMIN) tablet Take 1 tablet by mouth daily.     Omega-3 Fatty Acids (FISH OIL PO) Take 1 capsule by mouth 2 (two) times a day.      SUMAtriptan (IMITREX) 50 MG tablet TAKE ONE TABLET BY MOUTH AT ONSET OF HEADACHE; MAY REPEAT ONE TABLET IN 2 HOURS IF NEEDED. (Patient taking differently: Take 50 mg by mouth See admin instructions. Take 1 tablet at onset of headache; may repeat one tablet in 2 hours if needed.) 10 tablet 0   Ubrogepant (UBRELVY) 50 MG TABS Take 50 mg by mouth as needed (may repeat once after 2 hours). 20 tablet 3   Vitamin D, Ergocalciferol, (DRISDOL) 1.25 MG (50000 UT) CAPS capsule TAKE ONE CAPSULE BY MOUTH TWICE WEEKLY (Patient taking differently: Take  50,000 Units by mouth 2 (two) times a week. Tuesday and Friday) 10 capsule 0   No current facility-administered medications on file prior to visit.     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    Migraines    LIGHT SENSITIVITY , NOW ON IMITREX SINCE 08-02-2018,NOTICES IMPROVEMENT    Multiple food allergies    Muscle pain    Numbness and tingling in both hands    Pre-diabetes  Shortness of breath    was due to a medication side effect, now off medication , SOB resolved    Sleep apnea    Stress    Swelling of both lower extremities     PAST SURGICAL HISTORY: Past Surgical History:  Procedure Laterality Date   ELBOW LIGAMENT RECONSTRUCTION Right 08/21/2018   Procedure: LATERAL COLLATERAL LIGAMENT REPAIR;  Surgeon: Hiram Gash, MD;  Location: [facility];  Service: Orthopedics;  Laterality: Right;   FINGER SURGERY Left     index finger   KNEE SURGERY Left    LIVER BIOPSY N/A 05/15/2014   Procedure: LIVER BIOPSY;  Surgeon: Inda Castle, MD;  Location: [facility];  Service: Endoscopy;  Laterality: N/A;  ultrasound to mark the liver   TENNIS ELBOW RELEASE/NIRSCHEL PROCEDURE Right 08/21/2018   Procedure: TENNIS ELBOW RELEASE/NIRSCHEL PROCEDURE;  Surgeon: Hiram Gash, MD;  Location: [facility];  Service: Orthopedics;  Laterality: Right;   TONSILLECTOMY AND ADENOIDECTOMY  1978   TYMPANOSTOMY TUBE PLACEMENT     ULNAR NERVE TRANSPOSITION Right 08/21/2018   Procedure: ULNAR NERVE DECOMPRESSION/TRANSPOSITION;  Surgeon: Hiram Gash, MD;  Location: [facility];  Service: Orthopedics;  Laterality: Right;   wrist cyst Left     SOCIAL HISTORY: Social History   Tobacco Use    Smoking status: Never Smoker   Smokeless tobacco: Never Used  Substance Use Topics   Alcohol use: No    Alcohol/week: 0.0 standard drinks   Drug use: No    FAMILY HISTORY: Family History  Problem Relation Age of Onset   Diabetes Mother    Liver disease Mother        ESLD- unknown etiology   Obesity Mother    Diabetes Maternal Grandmother    Diabetes Paternal Grandmother    Breast cancer Paternal Grandmother    Stroke Maternal Grandfather    Hyperlipidemia Father    ROS: Review of Systems  Gastrointestinal: Negative for diarrhea, nausea and vomiting.  Musculoskeletal:       Negative for muscle weakness.  Endo/Heme/Allergies:       Negative for polyphagia.   PHYSICAL EXAM: Pt in no acute distress  RECENT LABS AND TESTS: BMET    Component Value Date/Time   NA 138 08/16/2018 1048   NA 136 07/26/2018 1549   K 3.8 08/16/2018 1048   CL 98 08/16/2018 1048   CO2 31 08/16/2018 1048   GLUCOSE 127 (H) 08/16/2018 1048   BUN 21 (H) 08/16/2018 1048   BUN 18 07/26/2018 1549   CREATININE 1.04 (H) 08/16/2018 1048   CREATININE 0.85 09/08/2015 1514   CALCIUM 10.5 (H) 08/16/2018 1048   GFRNONAA >60 08/16/2018 1048   GFRNONAA 85 09/08/2015 1514   GFRAA >60 08/16/2018 1048   GFRAA >89 09/08/2015 1514   Lab Results  Component Value Date   HGBA1C 5.5 07/25/2018   HGBA1C 5.5 05/01/2018   HGBA1C 5.6 12/13/2017   HGBA1C 5.0 09/05/2017   HGBA1C 5.8 (H) 05/23/2017   Lab Results  Component Value Date   INSULIN 39.2 (H) 07/25/2018   INSULIN 30.4 (H) 05/01/2018   INSULIN 39.0 (H) 12/13/2017   INSULIN 24.3 09/05/2017   INSULIN 30.4 (H) 05/23/2017   CBC    Component Value Date/Time   WBC 6.2 08/16/2018 1048   RBC 4.46 08/16/2018 1048   HGB 14.4 08/16/2018 1048   HGB 14.6 07/26/2018 1549   HCT 41.2 08/16/2018 1048   HCT 41.6 07/26/2018 1549   PLT 229 08/16/2018 1048   PLT  254 07/26/2018 1549   MCV 92.4 08/16/2018 1048   MCV 90 07/26/2018 1549   MCH 32.3  08/16/2018 1048   MCHC 35.0 08/16/2018 1048   RDW 12.2 08/16/2018 1048   RDW 12.8 07/26/2018 1549   LYMPHSABS 2.8 05/23/2017 1249   MONOABS 0.4 10/27/2013 1604   EOSABS 0.3 05/23/2017 1249   BASOSABS 0.0 05/23/2017 1249   Iron/TIBC/Ferritin/ %Sat    Component Value Date/Time   IRON 95 06/27/2013 1645   TIBC 371 06/27/2013 1645   FERRITIN 401 (H) 06/27/2013 1645   IRONPCTSAT 26 06/27/2013 1645   Lipid Panel     Component Value Date/Time   CHOL 398 (H) 07/26/2018 1549   TRIG 304 (H) 07/26/2018 1549   HDL 57 07/26/2018 1549   CHOLHDL 7.0 (H) 07/26/2018 1549   CHOLHDL 5.0 06/06/2013 1654   VLDL 27 06/06/2013 1654   LDLCALC 280 (H) 07/26/2018 1549   Hepatic Function Panel     Component Value Date/Time   PROT 7.0 08/16/2018 1048   PROT 6.3 07/26/2018 1549   ALBUMIN 4.0 08/16/2018 1048   ALBUMIN 4.0 07/26/2018 1549   AST 41 08/16/2018 1048   ALT 67 (H) 08/16/2018 1048   ALKPHOS 118 08/16/2018 1048   BILITOT 1.3 (H) 08/16/2018 1048   BILITOT 0.8 07/26/2018 1549   BILIDIR 0.2 04/07/2015 1349   IBILI 1.0 04/07/2015 1349      Component Value Date/Time   TSH 6.170 (H) 06/27/2018 1139   TSH 5.010 (H) 05/01/2018 1031   TSH 5.570 (H) 12/13/2017 1014    Results for KENITHA, GLENDINNING (MRN [id number]) as of 09/05/2018 09:09  Ref. Range 07/25/2018 11:00  Vitamin D, 25-Hydroxy Latest Ref Range: 30.0 - 100.0 ng/mL 37.0    I, Michaelene Song, am acting as Location manager for [employer], PA-C I, Abby Potash, PA-C have reviewed above note and agree with its content

## 2018-09-06 ENCOUNTER — Telehealth: Payer: Self-pay | Admitting: Internal Medicine

## 2018-09-06 NOTE — Telephone Encounter (Signed)
smartphone/ consent/ my chart/ pre reg completed °

## 2018-09-11 ENCOUNTER — Encounter: Payer: Self-pay | Admitting: Internal Medicine

## 2018-09-11 ENCOUNTER — Telehealth: Payer: Self-pay | Admitting: Internal Medicine

## 2018-09-11 ENCOUNTER — Telehealth (INDEPENDENT_AMBULATORY_CARE_PROVIDER_SITE_OTHER): Payer: BC Managed Care – PPO | Admitting: Internal Medicine

## 2018-09-11 VITALS — Ht 68.0 in | Wt 325.0 lb

## 2018-09-11 DIAGNOSIS — R0600 Dyspnea, unspecified: Secondary | ICD-10-CM

## 2018-09-11 DIAGNOSIS — R5383 Other fatigue: Secondary | ICD-10-CM

## 2018-09-11 DIAGNOSIS — Z01812 Encounter for preprocedural laboratory examination: Secondary | ICD-10-CM

## 2018-09-11 DIAGNOSIS — Z6841 Body Mass Index (BMI) 40.0 and over, adult: Secondary | ICD-10-CM

## 2018-09-11 DIAGNOSIS — E782 Mixed hyperlipidemia: Secondary | ICD-10-CM | POA: Diagnosis not present

## 2018-09-11 DIAGNOSIS — K7581 Nonalcoholic steatohepatitis (NASH): Secondary | ICD-10-CM

## 2018-09-11 DIAGNOSIS — G4733 Obstructive sleep apnea (adult) (pediatric): Secondary | ICD-10-CM

## 2018-09-11 MED ORDER — METOPROLOL TARTRATE 100 MG PO TABS: ORAL_TABLET | ORAL | 0 refills | Status: DC

## 2018-09-11 NOTE — Telephone Encounter (Signed)
Patient called to review e-visit instructions. Patient aware that the following changes have been made: none Patient aware that they will need the following labs: APOE & BMET (pre-CT) Patient aware that they will need the following test(s): coronary CT with FFR - instructions reviewed; advised should be in her MyChart and if not, please let me know  1 month appt has been scheduled  No further assistance needed at this time.

## 2018-09-11 NOTE — Progress Notes (Signed)
Virtual Visit via Video Note   This visit type was conducted due to national recommendations for restrictions regarding the COVID-19 Pandemic (e.g. social distancing) in an effort to limit this patient's exposure and mitigate transmission in our community.  Due to her co-morbid illnesses, this patient is at least at moderate risk for complications without adequate follow up.  This format is felt to be most appropriate for this patient at this time.  All issues noted in this document were discussed and addressed.  A limited physical exam was performed with this format.  Please refer to the patient's chart for her consent to telehealth for [facility].   Evaluation Performed:  Doxy.me video visit  Date:  09/11/2018   ID:  [name], DOB 1975-03-29, MRN [id number]  Patient Location:  [address] [place] [address]  Provider location:   [address] [place], [place] [address]  PCP:  Doristine Bosworth, MD  Cardiologist:  No primary care provider on file. Electrophysiologist:  None   Chief Complaint:  Fatigue, dyspnea  History of Present Illness:    HOLLYANNE SPLAN is a 43 y.o. female who presents via audio/video conferencing for a telehealth visit today.  Isamary is a very pleasant 43 year old female kindly referred for evaluation of marked dyslipidemia.  Past medical history significant for some anxiety, depression, GERD and fatty liver disease.  He has a known history of significant dyslipidemia for 5 to 10 years and is been on various therapies including statins, however this was complicated by elevated liver enzymes most recently with atorvastatin 20 mg.  Interestingly, her mother ultimately had liver failure due to NAFLD and required a transplant.  She was on certain if her cholesterol numbers were elevated.  Her father also had very high triglycerides at one point she said over thousand, suggesting that she may very likely have a genetic dyslipidemia.  Recently  her lipid profile demonstrated total cholesterol 398, triglycerides 304, HDL normal at 57 and LDL of 280.  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  This is an important distinction as she may be at increased risk for both coronary disease prematurely as well as premature peripheral arterial disease.  Additionally, she is recently been having progressively worsening fatigue, migraine headaches, some dyspnea on exertion but no chest pain.  She does have obstructive sleep apnea and uses an oral appliance but is having a repeat sleep study to see if it is effective.  I am concerned that her symptoms might be due to coronary artery disease.  The patient does not have symptoms concerning for COVID-19 infection (fever, chills, cough, or new SHORTNESS OF BREATH).    Prior CV studies:   The following studies were reviewed today:  Lab work Chart reviewed  PMHx:  Past Medical History:  Diagnosis Date  . Allergy   . Anemia   . Anxiety   . Back pain   . Constipation   . Depression   . Fatty liver   . GERD (gastroesophageal reflux disease)   . Hyperlipidemia   . Infertility, female   . Joint pain   . Kidney problem   . 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 Migraines    LIGHT SENSITIVITY , NOW ON IMITREX SINCE 08-02-2018,NOTICES IMPROVEMENT   . Multiple food allergies   . Muscle pain   .  Numbness and tingling in both hands   . Pre-diabetes   . Shortness of breath    was due to a medication side effect, now off medication , SOB resolved   . Sleep apnea   . Stress   . Swelling of both lower extremities     Past Surgical History:  Procedure Laterality Date  . ELBOW LIGAMENT RECONSTRUCTION Right 08/21/2018   Procedure: LATERAL COLLATERAL LIGAMENT REPAIR;  Surgeon: Bjorn Pippin, MD;  Location: [facility];  Service: Orthopedics;   Laterality: Right;  . FINGER SURGERY Left     index finger  . KNEE SURGERY Left   . LIVER BIOPSY N/A 05/15/2014   Procedure: LIVER BIOPSY;  Surgeon: Louis Meckel, MD;  Location: [facility];  Service: Endoscopy;  Laterality: N/A;  ultrasound to mark the liver  . TENNIS ELBOW RELEASE/NIRSCHEL PROCEDURE Right 08/21/2018   Procedure: TENNIS ELBOW RELEASE/NIRSCHEL PROCEDURE;  Surgeon: Bjorn Pippin, MD;  Location: [facility];  Service: Orthopedics;  Laterality: Right;  . TONSILLECTOMY AND ADENOIDECTOMY  1978  . TYMPANOSTOMY TUBE PLACEMENT    . ULNAR NERVE TRANSPOSITION Right 08/21/2018   Procedure: ULNAR NERVE DECOMPRESSION/TRANSPOSITION;  Surgeon: Bjorn Pippin, MD;  Location: [facility];  Service: Orthopedics;  Laterality: Right;  . wrist cyst Left     FAMHx:  Family History  Problem Relation Age of Onset  . Diabetes Mother   . Liver disease Mother        ESLD- unknown etiology  . Obesity Mother   . Diabetes Maternal Grandmother   . Diabetes Paternal Grandmother   . Breast cancer Paternal Grandmother   . Stroke Maternal Grandfather   . Hyperlipidemia Father     SOCHx:   reports that she has never smoked. She has never used smokeless tobacco. She reports that she does not drink alcohol or use drugs.  ALLERGIES:  Allergies  Allergen Reactions  . Welchol [Colesevelam] Shortness Of Breath  . Lidocaine Other (See Comments)    Flu like sx's  . Relafen [Nabumetone] Other (See Comments)    migraine  . Celebrex [Celecoxib] Rash  . Sulfa Antibiotics Rash    MEDS:  Current Meds  Medication Sig  . buPROPion (WELLBUTRIN SR) 150 MG 12 hr tablet Take 1 tablet (150 mg total) by mouth 2 (two) times daily.  . busPIRone (BUSPAR) 15 MG tablet TAKE ONE TABLET BY MOUTH TWICE A DAY OR AS DIRECTED. (Patient taking differently: Take 15 mg by mouth 2 (two) times daily. TAKE ONE TABLET BY MOUTH TWICE A DAY OR AS DIRECTED.)  . cetirizine (ZYRTEC) 10 MG tablet Take 10 mg by mouth at bedtime.   .  chlorthalidone (HYGROTON) 25 MG tablet Take 1 tablet (25 mg total) by mouth daily.  . citalopram (CELEXA) 40 MG tablet Take 1 tablet (40 mg total) by mouth daily.  . fluticasone (FLONASE) 50 MCG/ACT nasal spray Place 2 sprays into both nostrils daily. (Patient taking differently: Place 2 sprays into both nostrils daily as needed for allergies. )  . levothyroxine (SYNTHROID) 88 MCG tablet Take 1 tablet (88 mcg total) by mouth daily. (Patient taking differently: Take 88 mcg by mouth daily before breakfast. )  . LORazepam (ATIVAN) 1 MG tablet Take 1 tablet (1 mg total) by mouth 2 (two) times daily as needed for anxiety.  . meclizine (ANTIVERT) 25 MG tablet TAKE ONE TABLET BY MOUTH THREE TIMES A DAY AS NEEDED FOR DIZZINESS (Patient taking differently: Take 25 mg by mouth 3 (three) times daily as  needed for dizziness. )  . meloxicam (MOBIC) 15 MG tablet Take 15 mg by mouth daily as needed for pain.   . metFORMIN (GLUCOPHAGE) 500 MG tablet Take 1 tablet (500 mg total) by mouth daily with breakfast.  . montelukast (SINGULAIR) 10 MG tablet Take 1 tablet (10 mg total) by mouth at bedtime.  . Multiple Vitamin (MULTIVITAMIN) tablet Take 1 tablet by mouth daily.  . Omega-3 Fatty Acids (FISH OIL PO) Take 1 capsule by mouth 2 (two) times a day.   . SUMAtriptan (IMITREX) 50 MG tablet TAKE ONE TABLET BY MOUTH AT ONSET OF HEADACHE; MAY REPEAT ONE TABLET IN 2 HOURS IF NEEDED. (Patient taking differently: Take 50 mg by mouth See admin instructions. Take 1 tablet at onset of headache; may repeat one tablet in 2 hours if needed.)  . Ubrogepant (UBRELVY) 50 MG TABS Take 50 mg by mouth as needed (may repeat once after 2 hours).  . Vitamin D, Ergocalciferol, (DRISDOL) 1.25 MG (50000 UT) CAPS capsule Take 1 capsule (50,000 Units total) by mouth 2 (two) times a week. Tuesday and Friday     ROS: Pertinent items noted in HPI and remainder of comprehensive ROS otherwise negative.  Labs/Other Tests and Data Reviewed:     Recent Labs: 06/27/2018: TSH 6.170 08/16/2018: ALT 67; BUN 21; Creatinine, Ser 1.04; Hemoglobin 14.4; Platelets 229; Potassium 3.8; Sodium 138   Recent Lipid Panel Lab Results  Component Value Date/Time   CHOL 398 (H) 07/26/2018 03:49 PM   TRIG 304 (H) 07/26/2018 03:49 PM   HDL 57 07/26/2018 03:49 PM   CHOLHDL 7.0 (H) 07/26/2018 03:49 PM   CHOLHDL 5.0 06/06/2013 04:54 PM   LDLCALC 280 (H) 07/26/2018 03:49 PM    Wt Readings from Last 3 Encounters:  09/11/18 (!) 325 lb (147.4 kg)  08/21/18 (!) 326 lb 5 oz (148 kg)  08/16/18 (!) 326 lb 5 oz (148 kg)     Exam:    Vital Signs:  Ht 5\' 8"  (1.727 m)   Wt (!) 325 lb (147.4 kg)   BMI 49.42 kg/m    General appearance: alert, no distress and morbidly obese Lungs: No audible breathing difficulty Abdomen: Morbidly obese Extremities: extremities normal, atraumatic, no cyanosis or edema Skin: No [name] or eruptive xanthomas Neurologic: Mental status: Alert, oriented, thought content appropriate Psych: Pleasant  ASSESSMENT & PLAN:    1. 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 Progressive dyspnea on exertion and fatigue 3. Morbid obesity 4. OSA with oral appliance 5. NAFLD 6. Statin intolerance due to elevated liver enzymes  Ms. 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  Patients with this disorder are at increased risk for coronary disease and peripheral arterial disease.  I am also concerned about her progressive symptoms over the past several months which could be related to coronary disease.  I like for her to undergo CT coronary angiography to  rule out any obstructive coronary disease and evaluate for coronary calcification or early onset heart disease.  Ultimately, I feel that she be a good candidate for PCSK9 inhibitor therapy although we may need to trial an alternative statin for lipid-lowering.  She might be a good candidate for Livalo although  this is only moderate potency.  We will plan follow-up after her coronary evaluation and repeat lab work with further recommendations on therapy.  Thanks again for this interesting referral.  COVID-19 Education: The signs and symptoms of COVID-19 were discussed with the patient and how to seek care for testing (follow up with PCP or arrange E-visit).  The importance of social distancing was discussed today.  Patient Risk:   After full review of this patients clinical status, I feel that they are at least moderate risk at this time.  Time:   Today, I have spent 35 minutes with the patient with telehealth technology discussing this lipidemia, family history of coronary disease, progressive dyspnea and fatigue, family history of coronary disease.     Medication Adjustments/Labs and Tests Ordered: Current medicines are reviewed at length with the patient today.  Concerns regarding medicines are outlined above.   Tests Ordered: Orders Placed This Encounter  Procedures  . CT CORONARY MORPH W/CTA COR W/SCORE W/CA W/CM &/OR WO/CM  . CT CORONARY FRACTIONAL FLOW RESERVE DATA PREP  . CT CORONARY FRACTIONAL FLOW RESERVE FLUID ANALYSIS  . Basic metabolic panel  . Apo E Genotyping: Cardio Risk    Medication Changes: Meds ordered this encounter  Medications  . metoprolol tartrate (LOPRESSOR) 100 MG tablet    Sig: Take ONE tablet TWO HOURS prior to test.    Dispense:  1 tablet    Refill:  0    Disposition:  in 1 month(s)  Chrystie Nose, MD, [facility], FACP  [place]  [facility]  Medical Director of the [facility] Lipid Disorders &  Cardiovascular Risk Reduction Clinic Diplomate of  the American Board of Clinical Lipidology Attending Cardiologist  Direct Dial: [phone]  Fax: [phone]  Website:  [email or web address]  Chrystie Nose, MD  09/11/2018 11:06 AM

## 2018-09-11 NOTE — Patient Instructions (Addendum)
Medication Instructions:  NO changes If you need a refill on your cardiac medications before your next appointment, please call your pharmacy.   Lab work: APOE testing & BMET prior to CT test If you have labs (blood work) drawn today and your tests are completely normal, you will receive your results only by: Marland Kitchen MyChart Message (if you have MyChart) OR . A paper copy in the mail If you have any lab test that is abnormal or we need to change your treatment, we will call you to review the results.  Testing/Procedures: Dr. Debara Pickett has ordered a coronary CT study. This is done at [facility]. The test will be pre-certified with your insurance company first and then you will receive a call to schedule this. Your instructions are noted below.  Follow-Up: with Dr. Debara Pickett in about 1 month - after CT test and lab work   Any Other Special Instructions Will Be Listed Below (If Applicable).   Coronary CT Instructions: Please arrive at the [facility] main entrance of [facility] at ________ AM/PM (30-45 minutes prior to test start time)  [facility] [place], [place] [address] [phone]  Proceed to the [facility] Radiology Department (First Floor).  Please follow these instructions carefully (unless otherwise directed):   On the Night Before the Test: . Be sure to Drink plenty of water. . Do not consume any caffeinated/decaffeinated beverages or chocolate 12 hours prior to your test. . Do not take any antihistamines 12 hours prior to your test. . If you take Metformin do not take 24 hours prior to test.  On the Day of the Test: . Drink plenty of water. Do not drink any water within one hour of the test. . Do not eat any food 4 hours prior to the test. . You may take your regular medications prior to the test.  . Take metoprolol tartrate 110m (Lopressor) two hours prior to test.      After the Test: . Drink plenty of water. . After receiving IV  contrast, you may experience a mild flushed feeling. This is normal. . On occasion, you may experience a mild rash up to 24 hours after the test. This is not dangerous. If this occurs, you can take Benadryl 25 mg and increase your fluid intake. . If you experience trouble breathing, this can be serious. If it is severe call 911 IMMEDIATELY. If it is mild, please call our office. . If you take any of these medications: Glipizide/Metformin, Avandament, Glucavance, please do not take 48 hours after completing test.

## 2018-09-18 ENCOUNTER — Other Ambulatory Visit: Payer: Self-pay

## 2018-09-18 ENCOUNTER — Encounter (INDEPENDENT_AMBULATORY_CARE_PROVIDER_SITE_OTHER): Payer: Self-pay | Admitting: Physician Assistant

## 2018-09-18 ENCOUNTER — Ambulatory Visit (INDEPENDENT_AMBULATORY_CARE_PROVIDER_SITE_OTHER): Payer: BC Managed Care – PPO | Admitting: Physician Assistant

## 2018-09-18 VITALS — BP 116/80 | HR 90 | Temp 98.0°F | Ht 68.0 in | Wt 325.0 lb

## 2018-09-18 DIAGNOSIS — Z6841 Body Mass Index (BMI) 40.0 and over, adult: Secondary | ICD-10-CM

## 2018-09-18 DIAGNOSIS — Z9189 Other specified personal risk factors, not elsewhere classified: Secondary | ICD-10-CM | POA: Diagnosis not present

## 2018-09-18 DIAGNOSIS — E559 Vitamin D deficiency, unspecified: Secondary | ICD-10-CM

## 2018-09-18 DIAGNOSIS — I10 Essential (primary) hypertension: Secondary | ICD-10-CM

## 2018-09-18 DIAGNOSIS — R7303 Prediabetes: Secondary | ICD-10-CM | POA: Diagnosis not present

## 2018-09-18 MED ORDER — METFORMIN HCL 500 MG PO TABS: 500.0000 mg | ORAL_TABLET | Freq: Every day | ORAL | 0 refills | Status: DC

## 2018-09-18 MED ORDER — CHLORTHALIDONE 25 MG PO TABS: 25.0000 mg | ORAL_TABLET | Freq: Every day | ORAL | 0 refills | Status: DC

## 2018-09-18 MED ORDER — VITAMIN D (ERGOCALCIFEROL) 1.25 MG (50000 UNIT) PO CAPS: 50000.0000 [IU] | ORAL_CAPSULE | ORAL | 0 refills | Status: DC

## 2018-09-18 NOTE — Progress Notes (Signed)
Office: [phone]  /  Fax: [phone]   HPI:   Chief Complaint: OBESITY Melanie [name] is here to discuss her progress with her obesity treatment plan. She is on the Category 3 plan and is following her eating plan approximately 80-85% of the time. She states she is exercising 0 minutes 0 times per week. Melanie [name] reports that she is physically going back to work and her schedule has been off. She is trying to get enough protein in daily but is not measuring her meat.  Her weight is (!) 325 lb (147.4 kg) today and has had a weight loss of 2 pounds over a period of 5 months since her last in-office visit. She has lost 13 lbs since starting treatment with [place].  Vitamin D deficiency Melanie [name] has a diagnosis of Vitamin D deficiency. She is currently taking prescription Vit D twice weekly and denies nausea, vomiting or muscle weakness.  Hypertension Melanie [name] is a 43 y.o. female with hypertension and is on chlorthalidone.  Melanie [name] denies chest pain or headache. She is working weight loss to help control her blood pressure with the goal of decreasing her risk of heart attack and stroke.   At risk for cardiovascular disease Melanie [name] is at a higher than average risk for cardiovascular disease due to obesity. She currently denies any chest pain.  Pre-Diabetes Melanie [name] has a diagnosis of prediabetes based on her elevated Hgb A1c and was informed this puts her at greater risk of developing diabetes. She is taking metformin currently and continues to work on diet and exercise to decrease risk of diabetes. She denies nausea, vomiting, or diarrhea. No polyphagia.  ASSESSMENT AND PLAN:  Vitamin D deficiency - Plan: Vitamin D, Ergocalciferol, (DRISDOL) 1.25 MG (50000 UT) CAPS capsule  Essential hypertension - Plan: chlorthalidone (HYGROTON) 25 MG tablet  Prediabetes - Plan: metFORMIN (GLUCOPHAGE) 500 MG tablet  At risk for heart disease  Class 3 severe obesity with serious comorbidity and body mass index  (BMI) of 45.0 to 49.9 in adult, unspecified obesity type ([place])  PLAN:  Vitamin D Deficiency Melanie [name] was informed that low Vitamin D levels contributes to fatigue and are associated with obesity, breast, and colon cancer. She agrees to continue to take prescription Vit D @ 50,000 IU twice a week #10 with 0 refills and will follow-up for routine testing of Vitamin D, at least 2-3 times per year. She was informed of the risk of over-replacement of Vitamin D and agrees to not increase her dose unless she discusses this with [place] first. Melanie [name] agrees to follow-up with our clinic in 3 weeks.  Hypertension We discussed sodium restriction, working on healthy weight loss, and a regular exercise program as the means to achieve improved blood pressure control. Melanie [name] agreed with this plan and agreed to follow up as directed. We will continue to monitor her blood pressure as well as her progress with the above lifestyle modifications. Melanie [name] was given a refill on her chlorthalidone #90 with 0 refills and agrees to follow-up with our clinic in 3 weeks. She will watch for signs of hypotension as she continues her lifestyle modifications.  Cardiovascular risk counseling Melanie [name] was given extended (15 minutes) coronary artery disease prevention counseling today. She is 43 y.o. female and has risk factors for heart disease including obesity. We discussed intensive lifestyle modifications today with an emphasis on specific weight loss instructions and strategies. Pt was also informed of the importance of increasing exercise and decreasing saturated fats to help prevent  heart disease.  Pre-Diabetes Melanie [name] will continue to work on weight loss, exercise, and decreasing simple carbohydrates in her diet to help decrease the risk of diabetes. We dicussed metformin including benefits and risks. She was informed that eating too many simple carbohydrates or too many calories at one sitting increases the likelihood of GI side effects. Melanie [name] was  given a refill on her metformin #90 with 0 refills and she agrees to follow-up with our clinic in 3 weeks.  Obesity Melanie [name] is currently in the action stage of change. As such, her goal is to continue with weight loss efforts. She has agreed to follow the Category 3 plan. Melanie [name] has been instructed to work up to a goal of 150 minutes of combined cardio and strengthening exercise per week for weight loss and overall health benefits. We discussed the following Behavioral Modification Strategies today: work on meal planning, easy cooking plans, and keeping healthy foods in the home.  Melanie [name] has agreed to follow-up with our clinic in 3 weeks. She was informed of the importance of frequent follow-up visits to maximize her success with intensive lifestyle modifications for her multiple health conditions.  ALLERGIES: Allergies  Allergen Reactions   Welchol [Colesevelam] Shortness Of Breath   Lidocaine Other (See Comments)    Flu like sx's   Relafen [Nabumetone] Other (See Comments)    migraine   Celebrex [Celecoxib] Rash   Sulfa Antibiotics Rash    MEDICATIONS: Current Outpatient Medications on File Prior to Visit  Medication Sig Dispense Refill   buPROPion (WELLBUTRIN SR) 150 MG 12 hr tablet Take 1 tablet (150 mg total) by mouth 2 (two) times daily. 180 tablet 0   busPIRone (BUSPAR) 15 MG tablet TAKE ONE TABLET BY MOUTH TWICE A DAY OR AS DIRECTED. (Patient taking differently: Take 15 mg by mouth 2 (two) times daily. TAKE ONE TABLET BY MOUTH TWICE A DAY OR AS DIRECTED.) 180 tablet 3   cetirizine (ZYRTEC) 10 MG tablet Take 10 mg by mouth at bedtime.      citalopram (CELEXA) 40 MG tablet Take 1 tablet (40 mg total) by mouth daily. 90 tablet 1   fluticasone (FLONASE) 50 MCG/ACT nasal spray Place 2 sprays into both nostrils daily. (Patient taking differently: Place 2 sprays into both nostrils daily as needed for allergies. ) 48 g 3   levothyroxine (SYNTHROID) 88 MCG tablet Take 1 tablet (88  mcg total) by mouth daily. (Patient taking differently: Take 88 mcg by mouth daily before breakfast. ) 90 tablet 0   LORazepam (ATIVAN) 1 MG tablet Take 1 tablet (1 mg total) by mouth 2 (two) times daily as needed for anxiety. 20 tablet 0   meclizine (ANTIVERT) 25 MG tablet TAKE ONE TABLET BY MOUTH THREE TIMES A DAY AS NEEDED FOR DIZZINESS (Patient taking differently: Take 25 mg by mouth 3 (three) times daily as needed for dizziness. ) 30 tablet 0   meloxicam (MOBIC) 15 MG tablet Take 15 mg by mouth daily as needed for pain.      metoprolol tartrate (LOPRESSOR) 100 MG tablet Take ONE tablet TWO HOURS prior to test. 1 tablet 0   montelukast (SINGULAIR) 10 MG tablet Take 1 tablet (10 mg total) by mouth at bedtime. 30 tablet 11   Multiple Vitamin (MULTIVITAMIN) tablet Take 1 tablet by mouth daily.     Omega-3 Fatty Acids (FISH OIL PO) Take 1 capsule by mouth 2 (two) times a day.      SUMAtriptan (IMITREX) 50 MG tablet TAKE ONE TABLET  BY MOUTH AT ONSET OF HEADACHE; MAY REPEAT ONE TABLET IN 2 HOURS IF NEEDED. (Patient taking differently: Take 50 mg by mouth See admin instructions. Take 1 tablet at onset of headache; may repeat one tablet in 2 hours if needed.) 10 tablet 0   Ubrogepant (UBRELVY) 50 MG TABS Take 50 mg by mouth as needed (may repeat once after 2 hours). 20 tablet 3   No current facility-administered medications on file prior to visit.     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    Migraines    LIGHT SENSITIVITY , NOW ON IMITREX SINCE 08-02-2018,NOTICES IMPROVEMENT    Multiple food allergies    Muscle pain    Numbness and tingling in both hands    Pre-diabetes     Shortness of breath    was due to a medication side effect, now off medication , SOB resolved    Sleep apnea    Stress    Swelling of both lower extremities     PAST SURGICAL HISTORY: Past Surgical History:  Procedure Laterality Date   ELBOW LIGAMENT RECONSTRUCTION Right 08/21/2018   Procedure: LATERAL COLLATERAL LIGAMENT REPAIR;  Surgeon: Hiram Gash, MD;  Location: [facility];  Service: Orthopedics;  Laterality: Right;   FINGER SURGERY Left     index finger   KNEE SURGERY Left    LIVER BIOPSY N/A 05/15/2014   Procedure: LIVER BIOPSY;  Surgeon: Inda Castle, MD;  Location: [facility];  Service: Endoscopy;  Laterality: N/A;  ultrasound to mark the liver   TENNIS ELBOW RELEASE/NIRSCHEL PROCEDURE Right 08/21/2018   Procedure: TENNIS ELBOW RELEASE/NIRSCHEL PROCEDURE;  Surgeon: Hiram Gash, MD;  Location: [facility];  Service: Orthopedics;  Laterality: Right;   TONSILLECTOMY AND ADENOIDECTOMY  1978   TYMPANOSTOMY TUBE PLACEMENT     ULNAR NERVE TRANSPOSITION Right 08/21/2018   Procedure: ULNAR NERVE DECOMPRESSION/TRANSPOSITION;  Surgeon: Hiram Gash, MD;  Location: [facility];  Service: Orthopedics;  Laterality: Right;   wrist cyst Left     SOCIAL HISTORY: Social History   Tobacco Use   Smoking status: Never Smoker   Smokeless tobacco: Never Used  Substance Use Topics   Alcohol use: No    Alcohol/week: 0.0 standard drinks   Drug use: No    FAMILY HISTORY: Family History  Problem Relation Age of Onset   Diabetes Mother    Liver disease Mother        ESLD- unknown etiology   Obesity Mother    Diabetes Maternal Grandmother    Diabetes Paternal Grandmother    Breast cancer Paternal Grandmother    Stroke Maternal Grandfather    Hyperlipidemia Father    ROS: Review of Systems  Cardiovascular: Negative for chest pain.  Gastrointestinal: Negative for diarrhea, nausea and vomiting.  Musculoskeletal:       Negative for muscle weakness.  Neurological:  Negative for headaches.  Endo/Heme/Allergies:       Negative for polyphagia.   PHYSICAL EXAM: Blood pressure 116/80, pulse 90, temperature 98 F (36.7 C), temperature source Oral, height 5' 8"  (1.727 m), weight (!) 325  lb (147.4 kg), SpO2 94 %. Body mass index is 49.42 kg/m. Physical Exam Vitals signs reviewed.  Constitutional:      Appearance: Normal appearance. She is obese.  Cardiovascular:     Rate and Rhythm: Normal rate.     Pulses: Normal pulses.  Pulmonary:     Effort: Pulmonary effort is normal.     Breath sounds: Normal breath sounds.  Musculoskeletal: Normal range of motion.  Skin:    General: Skin is warm and dry.  Neurological:     Mental Status: She is alert and oriented to person, place, and time.  Psychiatric:        Behavior: Behavior normal.   RECENT LABS AND TESTS: BMET    Component Value Date/Time   NA 138 08/16/2018 1048   NA 136 07/26/2018 1549   K 3.8 08/16/2018 1048   CL 98 08/16/2018 1048   CO2 31 08/16/2018 1048   GLUCOSE 127 (H) 08/16/2018 1048   BUN 21 (H) 08/16/2018 1048   BUN 18 07/26/2018 1549   CREATININE 1.04 (H) 08/16/2018 1048   CREATININE 0.85 09/08/2015 1514   CALCIUM 10.5 (H) 08/16/2018 1048   GFRNONAA >60 08/16/2018 1048   GFRNONAA 85 09/08/2015 1514   GFRAA >60 08/16/2018 1048   GFRAA >89 09/08/2015 1514   Lab Results  Component Value Date   HGBA1C 5.5 07/25/2018   HGBA1C 5.5 05/01/2018   HGBA1C 5.6 12/13/2017   HGBA1C 5.0 09/05/2017   HGBA1C 5.8 (H) 05/23/2017   Lab Results  Component Value Date   INSULIN 39.2 (H) 07/25/2018   INSULIN 30.4 (H) 05/01/2018   INSULIN 39.0 (H) 12/13/2017   INSULIN 24.3 09/05/2017   INSULIN 30.4 (H) 05/23/2017   CBC    Component Value Date/Time   WBC 6.2 08/16/2018 1048   RBC 4.46 08/16/2018 1048   HGB 14.4 08/16/2018 1048   HGB 14.6 07/26/2018 1549   HCT 41.2 08/16/2018 1048   HCT 41.6 07/26/2018 1549   PLT 229 08/16/2018 1048   PLT 254 07/26/2018 1549   MCV 92.4 08/16/2018  1048   MCV 90 07/26/2018 1549   MCH 32.3 08/16/2018 1048   MCHC 35.0 08/16/2018 1048   RDW 12.2 08/16/2018 1048   RDW 12.8 07/26/2018 1549   LYMPHSABS 2.8 05/23/2017 1249   MONOABS 0.4 10/27/2013 1604   EOSABS 0.3 05/23/2017 1249   BASOSABS 0.0 05/23/2017 1249   Iron/TIBC/Ferritin/ %Sat    Component Value Date/Time   IRON 95 06/27/2013 1645   TIBC 371 06/27/2013 1645   FERRITIN 401 (H) 06/27/2013 1645   IRONPCTSAT 26 06/27/2013 1645   Lipid Panel     Component Value Date/Time   CHOL 398 (H) 07/26/2018 1549   TRIG 304 (H) 07/26/2018 1549   HDL 57 07/26/2018 1549   CHOLHDL 7.0 (H) 07/26/2018 1549   CHOLHDL 5.0 06/06/2013 1654   VLDL 27 06/06/2013 1654   LDLCALC 280 (H) 07/26/2018 1549   Hepatic Function Panel     Component Value Date/Time   PROT 7.0 08/16/2018 1048   PROT 6.3 07/26/2018 1549   ALBUMIN 4.0 08/16/2018 1048   ALBUMIN 4.0 07/26/2018 1549   AST 41 08/16/2018 1048   ALT 67 (H) 08/16/2018 1048   ALKPHOS 118 08/16/2018 1048   BILITOT 1.3 (H) 08/16/2018 1048   BILITOT 0.8 07/26/2018 1549   BILIDIR 0.2 04/07/2015 1349   IBILI 1.0 04/07/2015 1349      Component Value Date/Time   TSH 6.170 (H) 06/27/2018 1139  TSH 5.010 (H) 05/01/2018 1031   TSH 5.570 (H) 12/13/2017 1014   Results for DEKLYN, TRACHTENBERG (MRN [id number]) as of 09/18/2018 17:25  Ref. Range 07/25/2018 11:00  Vitamin D, 25-Hydroxy Latest Ref Range: 30.0 - 100.0 ng/mL 37.0   OBESITY BEHAVIORAL INTERVENTION VISIT  Today's visit was #26  Starting weight: 338 lbs Starting date: 05/23/2017 Today's weight: 325 lbs  Today's date: 09/18/2018 Total lbs lost to date: 13   09/18/2018  Height 5' 8"  (1.727 m)  Weight 325 lb (147.4 kg) (A)  BMI (Calculated) 49.43  BLOOD PRESSURE - SYSTOLIC 620  BLOOD PRESSURE - DIASTOLIC 80   Body Fat % 35.5 %   ASK: We discussed the diagnosis of obesity with Melanie [name] today and Rashena agreed to give [place] permission to discuss obesity behavioral modification therapy  today.  ASSESS: Dajahnae has the diagnosis of obesity and her BMI today is 49.5. Eveleen is in the action stage of change.   ADVISE: Rufina was educated on the multiple health risks of obesity as well as the benefit of weight loss to improve her health. She was advised of the need for long term treatment and the importance of lifestyle modifications to improve her current health and to decrease her risk of future health problems.  AGREE: Multiple dietary modification options and treatment options were discussed and  Shonna agreed to follow the recommendations documented in the above note.  ARRANGE: Jetaime was educated on the importance of frequent visits to treat obesity as outlined per CMS and USPSTF guidelines and agreed to schedule her next follow up appointment today.  Migdalia Dk, am acting as transcriptionist for Abby Potash, PA-C I, Abby Potash, PA-C have reviewed above note and agree with its content

## 2018-09-25 ENCOUNTER — Ambulatory Visit
Admission: RE | Admit: 2018-09-25 | Discharge: 2018-09-25 | Disposition: A | Discharge status: Home / Self Care | Payer: BC Managed Care – PPO | Source: Ambulatory Visit | Attending: Family Medicine | Admitting: Family Medicine

## 2018-09-25 DIAGNOSIS — R519 Headache, unspecified: Secondary | ICD-10-CM

## 2018-09-25 DIAGNOSIS — H814 Vertigo of central origin: Secondary | ICD-10-CM

## 2018-09-25 MED ORDER — GADOBENATE DIMEGLUMINE 529 MG/ML IV SOLN
20.0000 mL | Freq: Once | INTRAVENOUS | Status: AC | PRN
  Administered 2018-09-25: 20 mL via INTRAVENOUS

## 2018-09-27 ENCOUNTER — Other Ambulatory Visit (INDEPENDENT_AMBULATORY_CARE_PROVIDER_SITE_OTHER): Payer: Self-pay | Admitting: Physician Assistant

## 2018-09-27 DIAGNOSIS — F3289 Other specified depressive episodes: Secondary | ICD-10-CM

## 2018-09-28 LAB — BASIC METABOLIC PANEL
BUN/Creatinine Ratio: 18 (ref 9–23)
BUN: 20 mg/dL (ref 6–24)
CO2: 25 mmol/L (ref 20–29)
Calcium: 10.5 mg/dL — ABNORMAL HIGH (ref 8.7–10.2)
Chloride: 95 mmol/L — ABNORMAL LOW (ref 96–106)
Creatinine, Ser: 1.12 mg/dL — ABNORMAL HIGH (ref 0.57–1.00)
GFR calc Af Amer: 69 mL/min/{1.73_m2} (ref >59)
GFR calc non Af Amer: 60 mL/min/{1.73_m2} (ref >59)
Glucose: 97 mg/dL (ref 65–99)
Potassium: 4.2 mmol/L (ref 3.5–5.2)
Sodium: 136 mmol/L (ref 134–144)

## 2018-09-30 ENCOUNTER — Telehealth (HOSPITAL_COMMUNITY): Payer: Self-pay | Admitting: Emergency Medicine

## 2018-09-30 NOTE — Telephone Encounter (Signed)
Reaching out to patient to offer assistance regarding upcoming cardiac imaging study; pt verbalizes understanding of appt date/time, parking situation and where to check in, pre-test NPO status and medications ordered, and verified current allergies; name and call back number provided for further questions should they arise Rillie Riffel RN Navigator Cardiac Imaging [facility] [phone] office [phone] cell  Pt denies covid symptoms, verbalized understanding of visitor policy. 

## 2018-10-01 ENCOUNTER — Encounter: Payer: BC Managed Care – PPO | Admitting: *Deleted

## 2018-10-01 ENCOUNTER — Other Ambulatory Visit: Payer: Self-pay

## 2018-10-01 ENCOUNTER — Ambulatory Visit (HOSPITAL_COMMUNITY)
Admission: RE | Admit: 2018-10-01 | Discharge: 2018-10-01 | Disposition: A | Discharge status: Home / Self Care | Payer: BC Managed Care – PPO | Source: Ambulatory Visit | Attending: Internal Medicine | Admitting: Internal Medicine

## 2018-10-01 ENCOUNTER — Ambulatory Visit (HOSPITAL_COMMUNITY): Payer: BC Managed Care – PPO

## 2018-10-01 DIAGNOSIS — R5383 Other fatigue: Secondary | ICD-10-CM

## 2018-10-01 DIAGNOSIS — Z006 Encounter for examination for normal comparison and control in clinical research program: Secondary | ICD-10-CM

## 2018-10-01 DIAGNOSIS — R0600 Dyspnea, unspecified: Secondary | ICD-10-CM

## 2018-10-01 MED ORDER — IOHEXOL 350 MG/ML SOLN
100.0000 mL | Freq: Once | INTRAVENOUS | Status: AC | PRN
  Administered 2018-10-01: 100 mL via INTRAVENOUS

## 2018-10-01 MED ORDER — NITROGLYCERIN 0.4 MG SL SUBL: 0.8000 mg | SUBLINGUAL_TABLET | Freq: Once | SUBLINGUAL | Status: DC

## 2018-10-01 MED ORDER — NITROGLYCERIN 0.4 MG SL SUBL
0.8000 mg | SUBLINGUAL_TABLET | Freq: Once | SUBLINGUAL | Status: AC
  Administered 2018-10-01: 0.8 mg via SUBLINGUAL
  Filled 2018-10-01: qty 25

## 2018-10-01 MED ORDER — NITROGLYCERIN 0.4 MG SL SUBL
SUBLINGUAL_TABLET | SUBLINGUAL | Status: AC
  Filled 2018-10-01: qty 2

## 2018-10-01 NOTE — Research (Signed)
CADFEM Informed Consent                  Subject Name:   Melanie [name]   Subject met inclusion and exclusion criteria.  The informed consent form, study requirements and expectations were reviewed with the subject and questions and concerns were addressed prior to the signing of the consent form.  The subject verbalized understanding of the trial requirements.  The subject agreed to participate in the CADFEM trial and signed the informed consent.  The informed consent was obtained prior to performance of any protocol-specific procedures for the subject.  A copy of the signed informed consent was given to the subject and a copy was placed in the subject's medical record.   [name], Research Assistant  10/01/2018 14:06 p.m.

## 2018-10-02 LAB — HM DIABETES EYE EXAM

## 2018-10-03 ENCOUNTER — Ambulatory Visit (INDEPENDENT_AMBULATORY_CARE_PROVIDER_SITE_OTHER): Payer: BC Managed Care – PPO | Admitting: Neurology

## 2018-10-03 ENCOUNTER — Other Ambulatory Visit: Payer: Self-pay

## 2018-10-03 DIAGNOSIS — G4733 Obstructive sleep apnea (adult) (pediatric): Secondary | ICD-10-CM

## 2018-10-03 DIAGNOSIS — R42 Dizziness and giddiness: Secondary | ICD-10-CM

## 2018-10-03 DIAGNOSIS — R519 Headache, unspecified: Secondary | ICD-10-CM

## 2018-10-03 DIAGNOSIS — R635 Abnormal weight gain: Secondary | ICD-10-CM

## 2018-10-03 DIAGNOSIS — G4761 Periodic limb movement disorder: Secondary | ICD-10-CM

## 2018-10-03 LAB — APO E GENOTYPING: CARDIO RISK

## 2018-10-06 ENCOUNTER — Other Ambulatory Visit: Payer: Self-pay | Admitting: Family Medicine

## 2018-10-06 DIAGNOSIS — E039 Hypothyroidism, unspecified: Secondary | ICD-10-CM

## 2018-10-06 NOTE — Telephone Encounter (Signed)
Pt due for repeat TSH in August Requested Prescriptions  Pending Prescriptions Disp Refills  . levothyroxine (SYNTHROID) 88 MCG tablet [Pharmacy Med Name: LEVOTHYROXINE 88 MCG TABLET] 30 tablet 0    Sig: TAKE ONE TABLET BY MOUTH DAILY     Endocrinology:  Hypothyroid Agents Failed - 10/06/2018 10:36 AM      Failed - TSH needs to be rechecked within 3 months after an abnormal result. Refill until TSH is due.      Failed - TSH in normal range and within 360 days    TSH  Date Value Ref Range Status  06/27/2018 6.170 (H) 0.450 - 4.500 uIU/mL Final         Passed - Valid encounter within last 12 months    Recent Outpatient Visits          3 months ago Statin intolerance   Primary Care at [facility], Arlie Solomons, MD   3 months ago Statin intolerance   Primary Care at [facility], Arlie Solomons, MD   6 months ago Statin intolerance   Primary Care at [facility], Arlie Solomons, MD   7 months ago Right lateral epicondylitis   Primary Care at [facility], Arlie Solomons, MD   8 months ago Chronic pain of both feet   Primary Care at [facility], Ines Bloomer, MD

## 2018-10-08 ENCOUNTER — Telehealth: Payer: Self-pay

## 2018-10-08 ENCOUNTER — Ambulatory Visit (INDEPENDENT_AMBULATORY_CARE_PROVIDER_SITE_OTHER): Payer: BC Managed Care – PPO | Admitting: Physician Assistant

## 2018-10-08 ENCOUNTER — Encounter (INDEPENDENT_AMBULATORY_CARE_PROVIDER_SITE_OTHER): Payer: Self-pay | Admitting: Physician Assistant

## 2018-10-08 ENCOUNTER — Other Ambulatory Visit: Payer: Self-pay

## 2018-10-08 VITALS — BP 123/82 | HR 78 | Temp 98.3°F | Ht 68.0 in | Wt 323.0 lb

## 2018-10-08 DIAGNOSIS — Z6841 Body Mass Index (BMI) 40.0 and over, adult: Secondary | ICD-10-CM | POA: Diagnosis not present

## 2018-10-08 DIAGNOSIS — E559 Vitamin D deficiency, unspecified: Secondary | ICD-10-CM | POA: Diagnosis not present

## 2018-10-08 DIAGNOSIS — G4733 Obstructive sleep apnea (adult) (pediatric): Secondary | ICD-10-CM

## 2018-10-08 NOTE — Procedures (Signed)
PATIENT'S NAME:  Melanie [name], [name] DOB:      08-17-1975      MR#:    [id number]     DATE OF RECORDING: 10/03/2018 REFERRING M.D.:  Dr. Neva Seat Study Performed:   Baseline Polysomnogram HISTORY: 43 year old woman with a history of prediabetes, hyperlipidemia, reflux disease, depression, anxiety, allergies, anemia, lower extremity edema, OSA and morbid obesity with a BMI of over 45, who reports recurrent headaches. She has an oral appliance for her sleep apnea treatment. She has had weight gain and presents for re-evaluation of her OSA while using her oral appliance. The patient's weight 325 pounds with a height of 68 (inches), resulting in a BMI of 49.1 kg/m2.   CURRENT MEDICATIONS: Wellbutrin, Zyrtec, Buspar, Hygroton, Celexa, Flonase, Synthroid,, Ativan, Antivert, Mobic, Glucophage, Singulair, Multivitamin, fish oil, Imitrex, Vitamin D   PROCEDURE:  This is a multichannel digital polysomnogram utilizing the Somnostar 11.2 system.  Electrodes and sensors were applied and monitored per AASM Specifications.   EEG, EOG, Chin and Limb EMG, were sampled at 200 Hz.  ECG, Snore and Nasal Pressure, Thermal Airflow, Respiratory Effort, CPAP Flow and Pressure, Oximetry was sampled at 50 Hz. Digital video and audio were recorded.      BASELINE STUDY  Lights Out was at 21:58 and Lights On at 05:04.  Total recording time (TRT) was 426.5 minutes, with a total sleep time (TST) of 374 minutes.   The patient's sleep latency was 50 minutes, which is delayed. REM latency was 155.5 minutes, which is delayed. The sleep efficiency was 87.7%.     SLEEP ARCHITECTURE: WASO (Wake after sleep onset) was 8 minutes. There were 9 minutes in Stage N1, 209 minutes Stage N2, 73 minutes Stage N3 and 83 minutes in Stage REM.  The percentage of Stage N1 was 2.4%, Stage N2 was 55.9%, Stage N3 was 19.5% and Stage R (REM sleep) was 22.2%.  The arousals were noted as: 35 were spontaneous, 3 were associated with PLMs, 14 were associated with  respiratory events.  RESPIRATORY ANALYSIS:  There were a total of 84 respiratory events:  38 obstructive apneas, 0 central apneas and 0 mixed apneas with a total of 38 apneas and an apnea index (AI) of 6.1 /hour. There were 46 hypopneas with a hypopnea index of 7.4 /hour. The patient also had 0 respiratory event related arousals (RERAs).      The total APNEA/HYPOPNEA INDEX (AHI) was 13.5/hour and the total RESPIRATORY DISTURBANCE INDEX was 13.5 /hour.  58 events occurred in REM sleep and 50 events in NREM. The REM AHI was 41.9 /hour, versus a non-REM AHI of 5.4. The patient spent 374 minutes of total sleep time in the supine position and 0 minutes in non-supine.. The supine AHI was 13.5 versus a non-supine AHI of n/a.  OXYGEN SATURATION & C02:  The Wake baseline 02 saturation was 96%, with the lowest being 78%. Time spent below 89% saturation equaled 18 minutes.  PERIODIC LIMB MOVEMENTS: The patient had a total of 213 Periodic Limb Movements.  The Periodic Limb Movement (PLM) index was 34.2 and the PLM Arousal index was .5/hour.  Audio and video analysis did not show any abnormal or unusual movements, behaviors, phonations or vocalizations. The patient took no bathroom breaks. Snoring was noted, ranging from mild to loud. The EKG was in keeping with normal sinus rhythm (NSR).  Post-study, the patient indicated that sleep was the same as usual.   IMPRESSION:  1. Obstructive Sleep Apnea (OSA) 2. Periodic Limb Movement Disorder (PLMD)  RECOMMENDATIONS:  1. This study demonstrates residual obstructive sleep apnea with a total AHI of 13.5/hour, severe REM related OSA with a REM AHI of 41.9/hour and O2 nadir of 78%. The patient will be advised to seek optimization of her OSA treatment. She should follow up with her dentist to see if further adjustment of her dental device is possible. Her significant REM related OSA and desaturations during sleep may account for her recurrent headaches. Weight loss  should be pursued. If the patient prefers to seek therapy with positive airway pressure, treatment with autoPAP or CPAP should be considered. Treatment can be achieved through a full night CPAP titration versus trial of AutoPAP at home. Options will be discussed with the patient.  2. Please note that untreated obstructive sleep apnea may carry additional perioperative morbidity. Patients with significant obstructive sleep apnea should receive perioperative PAP therapy and the surgeons and particularly the anesthesiologist should be informed of the diagnosis and the severity of the sleep disordered breathing. 3. Moderate PLMs (periodic limb movements of sleep) were noted during this study without any signficant arousals; clinical correlation is recommended. Medication effect from the antidepressant medication should be considered.  4. The patient should be cautioned not to drive, work at heights, or operate dangerous or heavy equipment when tired or sleepy. Review and reiteration of good sleep hygiene measures should be pursued with any patient. 5. The patient will be seen in follow-up by Dr. Frances Furbish at [facility] for discussion of the test results and further management strategies. The referring provider will be notified of the test results.  I certify that I have reviewed the entire raw data recording prior to the issuance of this report in accordance with the Standards of Accreditation of the American Academy of Sleep Medicine (AASM)  Huston Foley, MD, PhD Diplomat, American Board of Neurology and Sleep Medicine (Neurology and Sleep Medicine)

## 2018-10-08 NOTE — Telephone Encounter (Signed)
I called pt, discussed her sleep study results and recommendations. Pt would prefer to have a cpap titration study. I explained that process. Pt is agreeable to this plan. Pt verbalized understanding of results. Pt had no questions at this time but was encouraged to call back if questions arise.

## 2018-10-08 NOTE — Progress Notes (Signed)
Patient referred by Dr. Carlota Raspberry for recurrent HAs, seen by me on 08/15/18 in [facility], prior Dx of OSA, had PSG with oral appl in place on 10/03/18:    Please call and notify the patient that the recent sleep study did show residual obstructive sleep apnea with a total AHI of 13.5/hour, severe REM related OSA with a REM AHI of 41.9/hour and O2 nadir of 78%. The patient is advised to seek optimization of her OSA treatment. She should follow up with her dentist to see if further adjustment of her dental device is possible. Her significant REM related OSA and desaturations during sleep may account for her recurrent headaches. Weight loss should be pursued. If she would prefer, we can pursue treatment of her OSA with CPAP. She will likely benefit in particular with regards to her headaches. If she is agreeable, we can seek auth for PAP titration study. She may want to FU with her dentist first to discuss. Ultimately, optimal treatment of her OSA will help her HAs.  Please let me know, how she would like to proceed.  Thanks,  Star Age, MD, PhD [facility])

## 2018-10-08 NOTE — Progress Notes (Signed)
Office: [phone]  /  Fax: [phone]   HPI:   Chief Complaint: OBESITY Melanie [name] is here to discuss her progress with her obesity treatment plan. She is on the Category 3 plan and is following her eating plan approximately 50% of the time. She states she is taking the stairs at work. Melanie [name] reports that she has been under a great deal of family stress. She has been eating out more. She also states that she has had a sleep study and will be fitted for a CPAP in the next few weeks. Her weight is (!) 323 lb (146.5 kg) today and has had a weight loss of 2 pounds over a period of 3 weeks since her last visit. She has lost 15 lbs since starting treatment with [place].  Vitamin D deficiency Melanie [name] has a diagnosis of Vitamin D deficiency. She is currently taking Vit D and denies nausea, vomiting or muscle weakness.  ASSESSMENT AND PLAN:  Vitamin D deficiency  Class 3 severe obesity with serious comorbidity and body mass index (BMI) of 45.0 to 49.9 in adult, unspecified obesity type ([place])  PLAN:  Vitamin D Deficiency Melanie [name] was informed that low Vitamin D levels contributes to fatigue and are associated with obesity, breast, and colon cancer. She agrees to continue taking Vit D and will follow-up for routine testing of Vitamin D, at least 2-3 times per year. She was informed of the risk of over-replacement of Vitamin D and agrees to not increase her dose unless she discusses this with [place] first. Melanie [name] agrees to follow-up with our clinic in 3-4 weeks.  I spent > than 50% of the 15 minute visit on counseling as documented in the note.  Obesity Melanie [name] is currently in the action stage of change. As such, her goal is to continue with weight loss efforts. She has agreed to portion control better and make smarter food choices, such as increase vegetables and decrease simple carbohydrates.  Melanie [name] has been instructed to work up to a goal of 150 minutes of combined cardio and strengthening exercise per week for  weight loss and overall health benefits. We discussed the following Behavioral Modification Strategies today: work on meal planning and easy cooking plans, and keeping healthy foods in the home.  Melanie [name] has agreed to follow-up with our clinic in 3-4 weeks. She was informed of the importance of frequent follow-up visits to maximize her success with intensive lifestyle modifications for her multiple health conditions.  ALLERGIES: Allergies  Allergen Reactions   Welchol [Colesevelam] Shortness Of Breath   Lidocaine Other (See Comments)    Flu like sx's   Relafen [Nabumetone] Other (See Comments)    migraine   Celebrex [Celecoxib] Rash   Sulfa Antibiotics Rash    MEDICATIONS: Current Outpatient Medications on File Prior to Visit  Medication Sig Dispense Refill   buPROPion (WELLBUTRIN SR) 150 MG 12 hr tablet Take 1 tablet (150 mg total) by mouth 2 (two) times daily. 180 tablet 0   busPIRone (BUSPAR) 15 MG tablet TAKE ONE TABLET BY MOUTH TWICE A DAY OR AS DIRECTED. (Patient taking differently: Take 15 mg by mouth 2 (two) times daily. TAKE ONE TABLET BY MOUTH TWICE A DAY OR AS DIRECTED.) 180 tablet 3   cetirizine (ZYRTEC) 10 MG tablet Take 10 mg by mouth at bedtime.      chlorthalidone (HYGROTON) 25 MG tablet Take 1 tablet (25 mg total) by mouth daily. 90 tablet 0   citalopram (CELEXA) 40 MG tablet Take 1 tablet (40  mg total) by mouth daily. 90 tablet 1   fluticasone (FLONASE) 50 MCG/ACT nasal spray Place 2 sprays into both nostrils daily. (Patient taking differently: Place 2 sprays into both nostrils daily as needed for allergies. ) 48 g 3   levothyroxine (SYNTHROID) 88 MCG tablet TAKE ONE TABLET BY MOUTH DAILY 30 tablet 0   LORazepam (ATIVAN) 1 MG tablet Take 1 tablet (1 mg total) by mouth 2 (two) times daily as needed for anxiety. 20 tablet 0   meclizine (ANTIVERT) 25 MG tablet TAKE ONE TABLET BY MOUTH THREE TIMES A DAY AS NEEDED FOR DIZZINESS (Patient taking differently: Take 25  mg by mouth 3 (three) times daily as needed for dizziness. ) 30 tablet 0   meloxicam (MOBIC) 15 MG tablet Take 15 mg by mouth daily as needed for pain.      metFORMIN (GLUCOPHAGE) 500 MG tablet Take 1 tablet (500 mg total) by mouth daily with breakfast. 90 tablet 0   metoprolol tartrate (LOPRESSOR) 100 MG tablet Take ONE tablet TWO HOURS prior to test. 1 tablet 0   montelukast (SINGULAIR) 10 MG tablet Take 1 tablet (10 mg total) by mouth at bedtime. 30 tablet 11   Multiple Vitamin (MULTIVITAMIN) tablet Take 1 tablet by mouth daily.     Omega-3 Fatty Acids (FISH OIL PO) Take 1 capsule by mouth 2 (two) times a day.      SUMAtriptan (IMITREX) 50 MG tablet TAKE ONE TABLET BY MOUTH AT ONSET OF HEADACHE; MAY REPEAT ONE TABLET IN 2 HOURS IF NEEDED. (Patient taking differently: Take 50 mg by mouth See admin instructions. Take 1 tablet at onset of headache; may repeat one tablet in 2 hours if needed.) 10 tablet 0   Ubrogepant (UBRELVY) 50 MG TABS Take 50 mg by mouth as needed (may repeat once after 2 hours). 20 tablet 3   Vitamin D, Ergocalciferol, (DRISDOL) 1.25 MG (50000 UT) CAPS capsule Take 1 capsule (50,000 Units total) by mouth 2 (two) times a week. Tuesday and Friday 10 capsule 0   No current facility-administered medications on file prior to visit.     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    Migraines    LIGHT SENSITIVITY , NOW ON IMITREX SINCE 08-02-2018,NOTICES IMPROVEMENT    Multiple food allergies    Muscle pain    Numbness and tingling in both hands    Pre-diabetes    Shortness of breath    was due to a medication side effect, now off medication , SOB resolved     Sleep apnea    Stress    Swelling of both lower extremities     PAST SURGICAL HISTORY: Past Surgical History:  Procedure Laterality Date   ELBOW LIGAMENT RECONSTRUCTION Right 08/21/2018   Procedure: LATERAL COLLATERAL LIGAMENT REPAIR;  Surgeon: Hiram Gash, MD;  Location: [facility];  Service: Orthopedics;  Laterality: Right;   FINGER SURGERY Left     index finger   KNEE SURGERY Left    LIVER BIOPSY N/A 05/15/2014   Procedure: LIVER BIOPSY;  Surgeon: Inda Castle, MD;  Location:  [facility];  Service: Endoscopy;  Laterality: N/A;  ultrasound to mark the liver   TENNIS ELBOW RELEASE/NIRSCHEL PROCEDURE Right 08/21/2018   Procedure: TENNIS ELBOW RELEASE/NIRSCHEL PROCEDURE;  Surgeon: Hiram Gash, MD;  Location: [facility];  Service: Orthopedics;  Laterality: Right;   TONSILLECTOMY AND ADENOIDECTOMY  1978   TYMPANOSTOMY TUBE PLACEMENT     ULNAR NERVE TRANSPOSITION Right 08/21/2018   Procedure: ULNAR NERVE DECOMPRESSION/TRANSPOSITION;  Surgeon: Hiram Gash, MD;  Location: [facility];  Service: Orthopedics;  Laterality: Right;   wrist cyst Left     SOCIAL HISTORY: Social History   Tobacco Use   Smoking status: Never Smoker   Smokeless tobacco: Never Used  Substance Use Topics   Alcohol use: No    Alcohol/week: 0.0 standard drinks   Drug use: No    FAMILY HISTORY: Family History  Problem Relation Age of Onset   Diabetes Mother    Liver disease Mother        ESLD- unknown etiology   Obesity Mother    Diabetes Maternal Grandmother    Diabetes Paternal Grandmother    Breast cancer Paternal Grandmother    Stroke Maternal Grandfather    Hyperlipidemia Father    ROS: Review of Systems  Gastrointestinal: Negative for nausea and vomiting.  Musculoskeletal:       Negative for muscle weakness.   PHYSICAL EXAM: Blood pressure 123/82, pulse 78, temperature 98.3 F (36.8 C), temperature source Oral, height 5' 8"  (1.727 m), weight (!) 323 lb (146.5 kg), SpO2 99  %. Body mass index is 49.11 kg/m. Physical Exam Vitals signs reviewed.  Constitutional:      Appearance: Normal appearance. She is obese.  Cardiovascular:     Rate and Rhythm: Normal rate.     Pulses: Normal pulses.  Pulmonary:     Effort: Pulmonary effort is normal.     Breath sounds: Normal breath sounds.  Musculoskeletal: Normal range of motion.  Skin:    General: Skin is warm and dry.  Neurological:     Mental Status: She is alert and oriented to person, place, and time.  Psychiatric:        Behavior: Behavior normal.   RECENT LABS AND TESTS: BMET    Component Value Date/Time   NA 136 09/27/2018 1557   K 4.2 09/27/2018 1557   CL 95 (L) 09/27/2018 1557   CO2 25 09/27/2018 1557   GLUCOSE 97 09/27/2018 1557   GLUCOSE 127 (H) 08/16/2018 1048   BUN 20 09/27/2018 1557   CREATININE 1.12 (H) 09/27/2018 1557   CREATININE 0.85 09/08/2015 1514   CALCIUM 10.5 (H) 09/27/2018 1557   GFRNONAA 60 09/27/2018 1557   GFRNONAA 85 09/08/2015 1514   GFRAA 69 09/27/2018 1557   GFRAA >89 09/08/2015 1514   Lab Results  Component Value Date   HGBA1C 5.5 07/25/2018   HGBA1C 5.5 05/01/2018   HGBA1C 5.6 12/13/2017   HGBA1C 5.0 09/05/2017   HGBA1C 5.8 (H) 05/23/2017   Lab Results  Component Value Date   INSULIN 39.2 (H) 07/25/2018   INSULIN 30.4 (H) 05/01/2018   INSULIN 39.0 (H) 12/13/2017   INSULIN 24.3 09/05/2017   INSULIN 30.4 (H) 05/23/2017   CBC    Component Value Date/Time   WBC 6.2 08/16/2018 1048   RBC 4.46 08/16/2018 1048   HGB 14.4 08/16/2018 1048   HGB 14.6 07/26/2018 1549   HCT 41.2 08/16/2018 1048   HCT 41.6 07/26/2018 1549   PLT 229 08/16/2018 1048   PLT 254 07/26/2018  1549   MCV 92.4 08/16/2018 1048   MCV 90 07/26/2018 1549   MCH 32.3 08/16/2018 1048   MCHC 35.0 08/16/2018 1048   RDW 12.2 08/16/2018 1048   RDW 12.8 07/26/2018 1549   LYMPHSABS 2.8 05/23/2017 1249   MONOABS 0.4 10/27/2013 1604   EOSABS 0.3 05/23/2017 1249   BASOSABS 0.0 05/23/2017 1249    Iron/TIBC/Ferritin/ %Sat    Component Value Date/Time   IRON 95 06/27/2013 1645   TIBC 371 06/27/2013 1645   FERRITIN 401 (H) 06/27/2013 1645   IRONPCTSAT 26 06/27/2013 1645   Lipid Panel     Component Value Date/Time   CHOL 398 (H) 07/26/2018 1549   TRIG 304 (H) 07/26/2018 1549   HDL 57 07/26/2018 1549   CHOLHDL 7.0 (H) 07/26/2018 1549   CHOLHDL 5.0 06/06/2013 1654   VLDL 27 06/06/2013 1654   LDLCALC 280 (H) 07/26/2018 1549   Hepatic Function Panel     Component Value Date/Time   PROT 7.0 08/16/2018 1048   PROT 6.3 07/26/2018 1549   ALBUMIN 4.0 08/16/2018 1048   ALBUMIN 4.0 07/26/2018 1549   AST 41 08/16/2018 1048   ALT 67 (H) 08/16/2018 1048   ALKPHOS 118 08/16/2018 1048   BILITOT 1.3 (H) 08/16/2018 1048   BILITOT 0.8 07/26/2018 1549   BILIDIR 0.2 04/07/2015 1349   IBILI 1.0 04/07/2015 1349      Component Value Date/Time   TSH 6.170 (H) 06/27/2018 1139   TSH 5.010 (H) 05/01/2018 1031   TSH 5.570 (H) 12/13/2017 1014   Results for SOHA, THORUP (MRN [id number]) as of 10/08/2018 16:32  Ref. Range 07/25/2018 11:00  Vitamin D, 25-Hydroxy Latest Ref Range: 30.0 - 100.0 ng/mL 37.0   OBESITY BEHAVIORAL INTERVENTION VISIT  Today's visit was #27   Starting weight: 338 lbs Starting date: 05/23/2017 Today's weight: 323 lbs  Today's date: 10/08/2018 Total lbs lost to date: 15   10/08/2018  Height 5' 8"  (1.727 m)  Weight 323 lb (146.5 kg) (A)  BMI (Calculated) 49.12  BLOOD PRESSURE - SYSTOLIC 185  BLOOD PRESSURE - DIASTOLIC 82   Body Fat % 57 %   ASK: We discussed the diagnosis of obesity with Melanie [name] today and Melanie [name] agreed to give [place] permission to discuss obesity behavioral modification therapy today.  ASSESS: Melanie [name] has the diagnosis of obesity and her BMI today is 49.1. Melanie [name] is in the action stage of change.   ADVISE: Melanie [name] was educated on the multiple health risks of obesity as well as the benefit of weight loss to improve her health. She was advised of  the need for long term treatment and the importance of lifestyle modifications to improve her current health and to decrease her risk of future health problems.  AGREE: Multiple dietary modification options and treatment options were discussed and  Melanie [name] agreed to follow the recommendations documented in the above note.  ARRANGE: Melanie [name] was educated on the importance of frequent visits to treat obesity as outlined per CMS and USPSTF guidelines and agreed to schedule her next follow up appointment today.  IMichaelene Song, am acting as Location manager for [employer], PA-C

## 2018-10-08 NOTE — Telephone Encounter (Signed)
-----   Message from Star Age, MD sent at 10/08/2018  8:24 AM EDT ----- Patient referred by Dr. Carlota Raspberry for recurrent HAs, seen by me on 08/15/18 in [facility], prior Dx of OSA, had PSG with oral appl in place on 10/03/18:    Please call and notify the patient that the recent sleep study did show residual obstructive sleep apnea with a total AHI of 13.5/hour, severe REM related OSA with a REM AHI of 41.9/hour and O2 nadir of 78%. The patient is advised to seek optimization of her OSA treatment. She should follow up with her dentist to see if further adjustment of her dental device is possible. Her significant REM related OSA and desaturations during sleep may account for her recurrent headaches. Weight loss should be pursued. If she would prefer, we can pursue treatment of her OSA with CPAP. She will likely benefit in particular with regards to her headaches. If she is agreeable, we can seek auth for PAP titration study. She may want to FU with her dentist first to discuss. Ultimately, optimal treatment of her OSA will help her HAs.  Please let me know, how she would like to proceed.  Thanks,  Star Age, MD, PhD [facility])

## 2018-10-09 ENCOUNTER — Other Ambulatory Visit (INDEPENDENT_AMBULATORY_CARE_PROVIDER_SITE_OTHER): Payer: Self-pay | Admitting: Physician Assistant

## 2018-10-09 ENCOUNTER — Other Ambulatory Visit: Payer: Self-pay | Admitting: Family Medicine

## 2018-10-09 DIAGNOSIS — E559 Vitamin D deficiency, unspecified: Secondary | ICD-10-CM

## 2018-10-09 DIAGNOSIS — E039 Hypothyroidism, unspecified: Secondary | ICD-10-CM

## 2018-10-15 ENCOUNTER — Other Ambulatory Visit (INDEPENDENT_AMBULATORY_CARE_PROVIDER_SITE_OTHER): Payer: Self-pay | Admitting: Physician Assistant

## 2018-10-15 DIAGNOSIS — E559 Vitamin D deficiency, unspecified: Secondary | ICD-10-CM

## 2018-10-30 ENCOUNTER — Ambulatory Visit (INDEPENDENT_AMBULATORY_CARE_PROVIDER_SITE_OTHER): Payer: BC Managed Care – PPO | Admitting: Physician Assistant

## 2018-10-30 ENCOUNTER — Other Ambulatory Visit: Payer: Self-pay

## 2018-10-30 ENCOUNTER — Encounter (INDEPENDENT_AMBULATORY_CARE_PROVIDER_SITE_OTHER): Payer: Self-pay | Admitting: Physician Assistant

## 2018-10-30 VITALS — BP 118/77 | HR 76 | Temp 98.0°F | Ht 68.0 in | Wt 325.0 lb

## 2018-10-30 DIAGNOSIS — I10 Essential (primary) hypertension: Secondary | ICD-10-CM

## 2018-10-30 DIAGNOSIS — Z6841 Body Mass Index (BMI) 40.0 and over, adult: Secondary | ICD-10-CM

## 2018-10-30 DIAGNOSIS — F3289 Other specified depressive episodes: Secondary | ICD-10-CM

## 2018-10-30 DIAGNOSIS — Z9189 Other specified personal risk factors, not elsewhere classified: Secondary | ICD-10-CM

## 2018-10-30 DIAGNOSIS — R7303 Prediabetes: Secondary | ICD-10-CM

## 2018-10-30 DIAGNOSIS — E559 Vitamin D deficiency, unspecified: Secondary | ICD-10-CM | POA: Diagnosis not present

## 2018-10-30 MED ORDER — METFORMIN HCL 500 MG PO TABS: 500.0000 mg | ORAL_TABLET | Freq: Every day | ORAL | 0 refills | Status: DC

## 2018-10-30 MED ORDER — CHLORTHALIDONE 25 MG PO TABS: 25.0000 mg | ORAL_TABLET | Freq: Every day | ORAL | 0 refills | Status: DC

## 2018-10-30 MED ORDER — BUPROPION HCL ER (SR) 150 MG PO TB12: 150.0000 mg | ORAL_TABLET | Freq: Two times a day (BID) | ORAL | 0 refills | Status: DC

## 2018-10-31 ENCOUNTER — Encounter: Payer: Self-pay | Admitting: Internal Medicine

## 2018-10-31 ENCOUNTER — Telehealth (INDEPENDENT_AMBULATORY_CARE_PROVIDER_SITE_OTHER): Payer: BC Managed Care – PPO | Admitting: Internal Medicine

## 2018-10-31 VITALS — BP 118/77 | HR 70 | Ht 68.0 in | Wt 325.0 lb

## 2018-10-31 DIAGNOSIS — E7849 Other hyperlipidemia: Secondary | ICD-10-CM | POA: Diagnosis not present

## 2018-10-31 DIAGNOSIS — G4733 Obstructive sleep apnea (adult) (pediatric): Secondary | ICD-10-CM | POA: Diagnosis not present

## 2018-10-31 DIAGNOSIS — K7581 Nonalcoholic steatohepatitis (NASH): Secondary | ICD-10-CM

## 2018-10-31 NOTE — Patient Instructions (Addendum)
Medication Instructions:  Dr. Debara Pickett recommends Repatha 123m/mL one dose every 14 days (PCSK9). This is an injectable cholesterol medication. This medication will need prior approval with your insurance company, which we will work on. If the medication is not approved initially, we may need to do an appeal with your insurance. We will keep you updated on this process. This medication can be provided at some local pharmacies or be shipped to you from a specialty pharmacy.   You can obtain a co-pay card from [email or web address]  If you need a refill on your cardiac medications before your next appointment, please call your pharmacy.   Lab work: FASTING lab work in 3-4 months to check cholesterol If you have labs (blood work) drawn today and your tests are completely normal, you will receive your results only by: .Marland KitchenMyChart Message (if you have MyChart) OR . A paper copy in the mail If you have any lab test that is abnormal or we need to change your treatment, we will call you to review the results.  Testing/Procedures: NONE  Follow-Up: Dr. HDebara Pickettrecommends that you [place]. Please have fasting blood work about 1 week prior to this visit and he will review the blood work results with you at your appointment.

## 2018-10-31 NOTE — Progress Notes (Signed)
Office: [phone]  /  Fax: [phone]   HPI:   Chief Complaint: OBESITY Melanie [name] is here to discuss her progress with her obesity treatment plan. She is on the portion control better and make smarter food choices, such as increase vegetables and decrease simple carbohydrates and is following her eating plan approximately 75% of the time. She states she is exercising 0 minutes 0 times per week. Arayah reports that she feels much less stress following the [name] plan and would like to continue. She has taken a leave of absence from her job for one month due to stress at home and health issues. Her weight is (!) 325 lb (147.4 kg) today and has had a weight gain of 2 lbs since her last visit. She has lost 13 lbs since starting treatment with [place].  Vitamin D deficiency Melanie [name] has a diagnosis of Vitamin D deficiency. She is currently taking Vit D and denies nausea, vomiting or muscle weakness.  Depression with emotional eating behaviors Melanie [name] is struggling with emotional eating and using food for comfort to the extent that it is negatively impacting her health. She often snacks when she is not hungry. Melanie [name] sometimes feels she is out of control and then feels guilty that she made poor food choices. She has been working on behavior modification techniques to help reduce her emotional eating and has been somewhat successful. Melanie [name] is on Wellbutrin and Celexa and shows no sign of suicidal or homicidal ideations.  Depression screen [facility] 2/9 07/30/2018 07/26/2018 06/21/2018 04/06/2018 02/18/2018  Decreased Interest 0 0 0 0 0  Down, Depressed, Hopeless 0 0 0 0 0  PHQ - 2 Score 0 0 0 0 0  Altered sleeping - - - - -  Tired, decreased energy - - - - -  Change in appetite - - - - -  Feeling bad or failure about yourself  - - - - -  Trouble concentrating - - - - -  Moving slowly or fidgety/restless - - - - -  Suicidal thoughts - - - - -  PHQ-9 Score - - - - -  Difficult doing work/chores - - - - -    Pre-Diabetes Melanie [name] has a diagnosis of prediabetes based on her elevated Hgb A1c and was informed this puts her at greater risk of developing diabetes. She is taking metformin currently and continues to work on diet and exercise to decrease risk of diabetes. She denies nausea, vomiting, or diarrhea. No polyphagia.  At risk for diabetes Melanie [name] is at higher than average risk for developing diabetes due to her obesity. She currently denies polyuria or polydipsia.  Hypertension Melanie [name] is a 43 y.o. female with hypertension and is controlled on chlorthalidone and metoprolol. Melanie [name] denies chest pain. She is working weight loss to help control her blood pressure with the goal of decreasing her risk of heart attack and stroke.  ASSESSMENT AND PLAN:  Vitamin D deficiency  Prediabetes - Plan: metFORMIN (GLUCOPHAGE) 500 MG tablet  Essential hypertension - Plan: chlorthalidone (HYGROTON) 25 MG tablet  Other depression - with emotional eating - Plan: buPROPion (WELLBUTRIN SR) 150 MG 12 hr tablet  At risk for diabetes mellitus  Class 3 severe obesity with serious comorbidity and body mass index (BMI) of 45.0 to 49.9 in adult, unspecified obesity type ([place])  PLAN:  Vitamin D Deficiency Melanie [name] was informed that low Vitamin D levels contributes to fatigue and are associated with obesity, breast, and colon cancer. She agrees to  continue taking Vit D and will follow-up for routine testing of Vitamin D, at least 2-3 times per year. She was informed of the risk of over-replacement of Vitamin D and agrees to not increase her dose unless she discusses this with [place] first. Treanna agrees to follow-up with our clinic in 3 weeks.  Depression with Emotional Eating Behaviors We discussed behavior modification techniques today to help Melanie [name] deal with her emotional eating and depression. Melanie [name] was given a refill on her Wellbutrin 150 mg #180 with 0 refills and agrees to follow-up with our clinic in 3  weeks.  Pre-Diabetes Melanie [name] will continue to work on weight loss, exercise, and decreasing simple carbohydrates in her diet to help decrease the risk of diabetes. We dicussed metformin including benefits and risks. She was informed that eating too many simple carbohydrates or too many calories at one sitting increases the likelihood of GI side effects. Melanie [name] was given a refill on her metformin 500 mg #90 with 0 refills and agrees to follow-up with our clinic in 3 weeks.  Diabetes risk counseling Melanie [name] was given extended (15 minutes) diabetes prevention counseling today. She is 43 y.o. female and has risk factors for diabetes including obesity. We discussed intensive lifestyle modifications today with an emphasis on weight loss as well as increasing exercise and decreasing simple carbohydrates in her diet.  Hypertension We discussed sodium restriction, working on healthy weight loss, and a regular exercise program as the means to achieve improved blood pressure control. Melanie [name] agreed with this plan and agreed to follow up as directed. We will continue to monitor her blood pressure as well as her progress with the above lifestyle modifications. Melanie [name] was given a refill on her chlorthalidone 25 mg #90 with 0 refills and agrees to follow-up with our clinic in 3 weeks. She will watch for signs of hypotension as she continues her lifestyle modifications.  Obesity Melanie [name] is currently in the action stage of change. As such, her goal is to continue with weight loss efforts. She has agreed to portion control better and make smarter food choices, such as increase vegetables and decrease simple carbohydrates. Melanie [name] has been instructed to work up to a goal of 150 minutes of combined cardio and strengthening exercise per week for weight loss and overall health benefits. We discussed the following Behavioral Modification Strategies today: work on meal planning and easy cooking plans, and keeping healthy foods in the  home.  Melanie [name] has agreed to follow-up with our clinic in 3 weeks. She was informed of the importance of frequent follow-up visits to maximize her success with intensive lifestyle modifications for her multiple health conditions.  ALLERGIES: Allergies  Allergen Reactions   Welchol [Colesevelam] Shortness Of Breath   Lidocaine Other (See Comments)    Flu like sx's   Relafen [Nabumetone] Other (See Comments)    migraine   Celebrex [Celecoxib] Rash   Sulfa Antibiotics Rash    MEDICATIONS: Current Outpatient Medications on File Prior to Visit  Medication Sig Dispense Refill   busPIRone (BUSPAR) 15 MG tablet TAKE ONE TABLET BY MOUTH TWICE A DAY OR AS DIRECTED. (Patient taking differently: Take 15 mg by mouth 2 (two) times daily. TAKE ONE TABLET BY MOUTH TWICE A DAY OR AS DIRECTED.) 180 tablet 3   cetirizine (ZYRTEC) 10 MG tablet Take 10 mg by mouth at bedtime.      citalopram (CELEXA) 40 MG tablet Take 1 tablet (40 mg total) by mouth daily. 90 tablet 1   fluticasone (FLONASE) 50  MCG/ACT nasal spray Place 2 sprays into both nostrils daily. (Patient taking differently: Place 2 sprays into both nostrils daily as needed for allergies. ) 48 g 3   levothyroxine (SYNTHROID) 88 MCG tablet TAKE ONE TABLET BY MOUTH DAILY 30 tablet 0   LORazepam (ATIVAN) 1 MG tablet Take 1 tablet (1 mg total) by mouth 2 (two) times daily as needed for anxiety. 20 tablet 0   meclizine (ANTIVERT) 25 MG tablet TAKE ONE TABLET BY MOUTH THREE TIMES A DAY AS NEEDED FOR DIZZINESS (Patient taking differently: Take 25 mg by mouth 3 (three) times daily as needed for dizziness. ) 30 tablet 0   meloxicam (MOBIC) 15 MG tablet Take 15 mg by mouth daily as needed for pain.      metoprolol tartrate (LOPRESSOR) 100 MG tablet Take ONE tablet TWO HOURS prior to test. 1 tablet 0   montelukast (SINGULAIR) 10 MG tablet Take 1 tablet (10 mg total) by mouth at bedtime. 30 tablet 11   Multiple Vitamin (MULTIVITAMIN) tablet Take 1  tablet by mouth daily.     Omega-3 Fatty Acids (FISH OIL PO) Take 1 capsule by mouth 2 (two) times a day.      SUMAtriptan (IMITREX) 50 MG tablet TAKE ONE TABLET BY MOUTH AT ONSET OF HEADACHE; MAY REPEAT ONE TABLET IN 2 HOURS IF NEEDED. (Patient taking differently: Take 50 mg by mouth See admin instructions. Take 1 tablet at onset of headache; may repeat one tablet in 2 hours if needed.) 10 tablet 0   Ubrogepant (UBRELVY) 50 MG TABS Take 50 mg by mouth as needed (may repeat once after 2 hours). 20 tablet 3   Vitamin D, Ergocalciferol, (DRISDOL) 1.25 MG (50000 UT) CAPS capsule Take 1 capsule (50,000 Units total) by mouth 2 (two) times a week. Tuesday and Friday 10 capsule 0   No current facility-administered medications on file prior to visit.     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    Migraines    LIGHT SENSITIVITY , NOW ON IMITREX SINCE 08-02-2018,NOTICES IMPROVEMENT    Multiple food allergies    Muscle pain    Numbness and tingling in both hands    Pre-diabetes    Shortness of breath    was due to a medication side effect, now off medication , SOB resolved    Sleep apnea    Stress    Swelling of both lower extremities     PAST SURGICAL HISTORY: Past Surgical History:  Procedure Laterality Date   ELBOW LIGAMENT RECONSTRUCTION Right 08/21/2018   Procedure: LATERAL COLLATERAL LIGAMENT REPAIR;  Surgeon: Hiram Gash, MD;  Location: [facility];  Service: Orthopedics;  Laterality: Right;   FINGER SURGERY Left     index finger   KNEE SURGERY Left    LIVER BIOPSY N/A 05/15/2014   Procedure: LIVER BIOPSY;  Surgeon: Inda Castle, MD;  Location: [facility];  Service:  Endoscopy;  Laterality: N/A;  ultrasound to mark the liver   TENNIS ELBOW RELEASE/NIRSCHEL PROCEDURE Right 08/21/2018   Procedure: TENNIS ELBOW RELEASE/NIRSCHEL PROCEDURE;  Surgeon: Hiram Gash,  MD;  Location: [facility];  Service: Orthopedics;  Laterality: Right;   TONSILLECTOMY AND ADENOIDECTOMY  1978   TYMPANOSTOMY TUBE PLACEMENT     ULNAR NERVE TRANSPOSITION Right 08/21/2018   Procedure: ULNAR NERVE DECOMPRESSION/TRANSPOSITION;  Surgeon: Hiram Gash, MD;  Location: [facility];  Service: Orthopedics;  Laterality: Right;   wrist cyst Left     SOCIAL HISTORY: Social History   Tobacco Use   Smoking status: Never Smoker   Smokeless tobacco: Never Used  Substance Use Topics   Alcohol use: No    Alcohol/week: 0.0 standard drinks   Drug use: No    FAMILY HISTORY: Family History  Problem Relation Age of Onset   Diabetes Mother    Liver disease Mother        ESLD- unknown etiology   Obesity Mother    Diabetes Maternal Grandmother    Diabetes Paternal Grandmother    Breast cancer Paternal Grandmother    Stroke Maternal Grandfather    Hyperlipidemia Father    ROS: Review of Systems  Cardiovascular: Negative for chest pain.  Gastrointestinal: Negative for diarrhea, nausea and vomiting.  Musculoskeletal:       Negative for muscle weakness.  Endo/Heme/Allergies:       Negative for polyphagia.  Psychiatric/Behavioral: Positive for depression (emotional eating). Negative for suicidal ideas.       Negative for homicidal ideas.   PHYSICAL EXAM: Blood pressure 118/77, pulse 76, temperature 98 F (36.7 C), temperature source Oral, height 5' 8"  (1.727 m), weight (!) 325 lb (147.4 kg), SpO2 95 %. Body mass index is 49.42 kg/m. Physical Exam Vitals signs reviewed.  Constitutional:      Appearance: Normal appearance. She is obese.  Cardiovascular:     Rate and Rhythm: Normal rate.     Pulses: Normal pulses.  Pulmonary:     Effort: Pulmonary effort is normal.      Breath sounds: Normal breath sounds.  Musculoskeletal: Normal range of motion.  Skin:    General: Skin is warm and dry.  Neurological:     Mental Status: She is alert and oriented to person, place, and time.  Psychiatric:        Behavior: Behavior normal.   RECENT LABS AND TESTS: BMET    Component Value Date/Time   NA 136 09/27/2018 1557   K 4.2 09/27/2018 1557   CL 95 (L) 09/27/2018 1557   CO2 25 09/27/2018 1557   GLUCOSE 97 09/27/2018 1557   GLUCOSE 127 (H) 08/16/2018 1048   BUN 20 09/27/2018 1557   CREATININE 1.12 (H) 09/27/2018 1557   CREATININE 0.85 09/08/2015 1514   CALCIUM 10.5 (H) 09/27/2018 1557   GFRNONAA 60 09/27/2018 1557   GFRNONAA 85 09/08/2015 1514   GFRAA 69 09/27/2018 1557   GFRAA >89 09/08/2015 1514   Lab Results  Component Value Date   HGBA1C 5.5 07/25/2018   HGBA1C 5.5 05/01/2018   HGBA1C 5.6 12/13/2017   HGBA1C 5.0 09/05/2017   HGBA1C 5.8 (H) 05/23/2017   Lab Results  Component Value Date   INSULIN 39.2 (H) 07/25/2018   INSULIN 30.4 (H) 05/01/2018   INSULIN 39.0 (H) 12/13/2017   INSULIN 24.3 09/05/2017   INSULIN 30.4 (H) 05/23/2017   CBC    Component Value Date/Time   WBC 6.2 08/16/2018 1048   RBC 4.46 08/16/2018 1048   HGB 14.4 08/16/2018 1048   HGB 14.6 07/26/2018 1549   HCT 41.2 08/16/2018 1048   HCT 41.6 07/26/2018 1549   PLT 229 08/16/2018 1048  PLT 254 07/26/2018 1549   MCV 92.4 08/16/2018 1048   MCV 90 07/26/2018 1549   MCH 32.3 08/16/2018 1048   MCHC 35.0 08/16/2018 1048   RDW 12.2 08/16/2018 1048   RDW 12.8 07/26/2018 1549   LYMPHSABS 2.8 05/23/2017 1249   MONOABS 0.4 10/27/2013 1604   EOSABS 0.3 05/23/2017 1249   BASOSABS 0.0 05/23/2017 1249   Iron/TIBC/Ferritin/ %Sat    Component Value Date/Time   IRON 95 06/27/2013 1645   TIBC 371 06/27/2013 1645   FERRITIN 401 (H) 06/27/2013 1645   IRONPCTSAT 26 06/27/2013 1645   Lipid Panel     Component Value Date/Time   CHOL 398 (H) 07/26/2018 1549   TRIG 304 (H)  07/26/2018 1549   HDL 57 07/26/2018 1549   CHOLHDL 7.0 (H) 07/26/2018 1549   CHOLHDL 5.0 06/06/2013 1654   VLDL 27 06/06/2013 1654   LDLCALC 280 (H) 07/26/2018 1549   Hepatic Function Panel     Component Value Date/Time   PROT 7.0 08/16/2018 1048   PROT 6.3 07/26/2018 1549   ALBUMIN 4.0 08/16/2018 1048   ALBUMIN 4.0 07/26/2018 1549   AST 41 08/16/2018 1048   ALT 67 (H) 08/16/2018 1048   ALKPHOS 118 08/16/2018 1048   BILITOT 1.3 (H) 08/16/2018 1048   BILITOT 0.8 07/26/2018 1549   BILIDIR 0.2 04/07/2015 1349   IBILI 1.0 04/07/2015 1349      Component Value Date/Time   TSH 6.170 (H) 06/27/2018 1139   TSH 5.010 (H) 05/01/2018 1031   TSH 5.570 (H) 12/13/2017 1014   Results for JUSTUS, DUERR (MRN [id number]) as of 10/31/2018 11:08  Ref. Range 07/25/2018 11:00  Vitamin D, 25-Hydroxy Latest Ref Range: 30.0 - 100.0 ng/mL 37.0   OBESITY BEHAVIORAL INTERVENTION VISIT  Today's visit was #28  Starting weight: 338 lbs Starting date: 05/23/2017 Today's weight: 325 lbs  Today's date: 10/30/2018 Total lbs lost to date: 13   10/30/2018  Height 5' 8"  (1.727 m)  Weight 325 lb (147.4 kg) (A)  BMI (Calculated) 49.43  BLOOD PRESSURE - SYSTOLIC 782  BLOOD PRESSURE - DIASTOLIC 77   Body Fat % 54 %  Total Body Water (lbs) 114.2 lbs   ASK: We discussed the diagnosis of obesity with Melanie [name] today and Marlys agreed to give [place] permission to discuss obesity behavioral modification therapy today.  ASSESS: Renay has the diagnosis of obesity and her BMI today is 49.4. Carleena is in the action stage of change.   ADVISE: Kennesha was educated on the multiple health risks of obesity as well as the benefit of weight loss to improve her health. She was advised of the need for long term treatment and the importance of lifestyle modifications to improve her current health and to decrease her risk of future health problems.  AGREE: Multiple dietary modification options and treatment options were  discussed and  Virgen agreed to follow the recommendations documented in the above note.  ARRANGE: Lamia was educated on the importance of frequent visits to treat obesity as outlined per CMS and USPSTF guidelines and agreed to schedule her next follow up appointment today.  Migdalia Dk, am acting as transcriptionist for Abby Potash, PA-C I, Abby Potash, PA-C have reviewed above note and agree with its content

## 2018-11-01 ENCOUNTER — Telehealth: Payer: Self-pay | Admitting: Internal Medicine

## 2018-11-01 NOTE — Telephone Encounter (Signed)
PA for Praluent 140m/mL q2 weeks to be submitted via [email or web address] [facility] [employer] Patient's insurance plan ([place] prefers Praluent over ROasis  (Key: [id number]

## 2018-11-01 NOTE — Addendum Note (Signed)
Addended by: Fidel Levy on: 11/01/2018 11:53 AM   Modules accepted: Orders

## 2018-11-01 NOTE — Progress Notes (Signed)
Virtual Visit via Video Note   This visit type was conducted due to national recommendations for restrictions regarding the COVID-19 Pandemic (e.g. social distancing) in an effort to limit this patient's exposure and mitigate transmission in our community.  Due to her co-morbid illnesses, this patient is at least at moderate risk for complications without adequate follow up.  This format is felt to be most appropriate for this patient at this time.  All issues noted in this document were discussed and addressed.  A limited physical exam was performed with this format.  Please refer to the patient's chart for her consent to telehealth for [facility].   Evaluation Performed: Doxy[employer]me video visit  Date:  11/01/2018   ID:  Melanie [name], DOB [birth year], MRN [id number]  Patient Location:  [address] [place] [address]  Provider location:   [address] [place], [place] [address]  PCP:  Doristine Bosworth, MD  Cardiologist:  No primary care provider on file. Electrophysiologist:  None   Chief Complaint: No complaints  History of Present Illness:    Melanie [name] is a 43 y.o. female who presents via audio/video conferencing for a telehealth visit today.  Melanie [name] is seen today for video visit follow-up.  Overall she seems to be doing well.  She underwent CT coronary angiography with a 0 calcium score and right dominant circulation with normal coronaries.  This is an excellent finding, but confusing given her significant dyslipidemia.  She was also sent for April E testing to rule out dysbetalipoproteinemia.  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  This is not associated with increased coronary risk.  Again her last cholesterol numbers were significantly elevated with total cholesterol 398 and LDL of 280.  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  The patient  does not have symptoms concerning for COVID-19 infection (fever, chills, cough, or new SHORTNESS OF BREATH).    Prior CV studies:   The following studies were reviewed today:  Lab work Coronary CT  PMHx:  Past Medical History:  Diagnosis Date  . Allergy   . Anemia   . Anxiety   . Back pain   . Constipation   . Depression   . Fatty liver   . GERD (gastroesophageal reflux disease)   . Hyperlipidemia   . Infertility, female   . Joint pain   . Kidney problem   . 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 Migraines    LIGHT SENSITIVITY , NOW ON IMITREX SINCE 08-02-2018,NOTICES IMPROVEMENT   . Multiple food allergies   . Muscle pain   . Numbness and tingling in both hands   . Pre-diabetes   . Shortness of breath    was due to a medication side effect, now off medication , SOB resolved   . Sleep apnea   . Stress   . Swelling of both lower extremities     Past Surgical History:  Procedure Laterality Date  . ELBOW LIGAMENT RECONSTRUCTION Right 08/21/2018   Procedure: LATERAL COLLATERAL LIGAMENT REPAIR;  Surgeon: Bjorn Pippin, MD;  Location: [facility];  Service: Orthopedics;  Laterality: Right;  . FINGER SURGERY Left     index finger  . KNEE SURGERY Left   . LIVER BIOPSY N/A 05/15/2014   Procedure: LIVER BIOPSY;  Surgeon: Louis Meckel, MD;  Location: [facility];  Service: Endoscopy;  Laterality: N/A;  ultrasound to mark the liver  . TENNIS ELBOW RELEASE/NIRSCHEL PROCEDURE Right 08/21/2018   Procedure: TENNIS ELBOW RELEASE/NIRSCHEL PROCEDURE;  Surgeon: Bjorn Pippin, MD;  Location: [facility];  Service: Orthopedics;  Laterality: Right;  . TONSILLECTOMY AND ADENOIDECTOMY  1978  . TYMPANOSTOMY TUBE PLACEMENT    . ULNAR NERVE TRANSPOSITION Right 08/21/2018   Procedure: ULNAR NERVE DECOMPRESSION/TRANSPOSITION;  Surgeon: Bjorn Pippin, MD;  Location: [facility];  Service: Orthopedics;  Laterality: Right;  . wrist cyst Left     FAMHx:  Family History  Problem Relation  Age of Onset  . Diabetes Mother   . Liver disease Mother        ESLD- unknown etiology  . Obesity Mother   . Diabetes Maternal Grandmother   . Diabetes Paternal Grandmother   . Breast cancer Paternal Grandmother   . Stroke Maternal Grandfather   . Hyperlipidemia Father     SOCHx:   reports that she has never smoked. She has never used smokeless tobacco. She reports that she does not drink alcohol or use drugs.  ALLERGIES:  Allergies  Allergen Reactions  . Welchol [Colesevelam] Shortness Of Breath  . Lidocaine Other (See Comments)    Flu like sx's  . Relafen [Nabumetone] Other (See Comments)    migraine  . Celebrex [Celecoxib] Rash  . Sulfa Antibiotics Rash    MEDS:  Current Meds  Medication Sig  . buPROPion (WELLBUTRIN SR) 150 MG 12 hr tablet Take 1 tablet (150 mg total) by mouth 2 (two) times daily.  . busPIRone (BUSPAR) 15 MG tablet TAKE ONE TABLET BY MOUTH TWICE A DAY OR AS DIRECTED. (Patient taking differently: Take 15 mg by mouth 2 (two) times daily. TAKE ONE TABLET BY MOUTH TWICE A DAY OR AS DIRECTED.)  . cetirizine (ZYRTEC) 10 MG tablet Take 10 mg by mouth at bedtime.   . chlorthalidone (HYGROTON) 25 MG tablet Take 1 tablet (25 mg total) by mouth daily.  . citalopram (CELEXA) 40 MG tablet Take 1 tablet (40 mg total) by mouth daily.  . Evolocumab (REPATHA SURECLICK) 140 MG/ML SOAJ Inject 1 Dose into the skin every 14 (fourteen) days.  . fluticasone (FLONASE) 50 MCG/ACT nasal spray Place 2 sprays into both nostrils daily. (Patient taking differently: Place 2 sprays into both nostrils daily as needed for allergies. )  . levothyroxine (SYNTHROID) 88 MCG tablet TAKE ONE TABLET BY MOUTH DAILY  . LORazepam (ATIVAN) 1 MG tablet Take 1 tablet (1 mg total) by mouth 2 (two) times daily as needed for anxiety.  . meclizine (ANTIVERT) 25 MG tablet TAKE ONE TABLET BY MOUTH THREE TIMES A DAY AS NEEDED FOR DIZZINESS (Patient taking differently: Take 25 mg by mouth 3 (three) times daily  as needed for dizziness. )  . meloxicam (MOBIC) 15 MG tablet Take 15 mg by mouth daily as needed for pain.   . metFORMIN (GLUCOPHAGE) 500 MG tablet Take 1 tablet (500 mg total) by mouth daily with breakfast.  . metoprolol tartrate (LOPRESSOR) 100 MG tablet Take ONE tablet TWO HOURS prior to test.  . montelukast (SINGULAIR) 10 MG tablet Take 1 tablet (10 mg total) by mouth at bedtime.  . Multiple Vitamin (MULTIVITAMIN) tablet Take 1 tablet by mouth daily.  . Omega-3 Fatty Acids (FISH OIL PO) Take 1 capsule by mouth 2 (two) times a day.   . SUMAtriptan (IMITREX) 50 MG tablet TAKE ONE TABLET BY MOUTH AT ONSET OF HEADACHE; MAY REPEAT ONE TABLET IN 2  HOURS IF NEEDED. (Patient taking differently: Take 50 mg by mouth See admin instructions. Take 1 tablet at onset of headache; may repeat one tablet in 2 hours if needed.)  . Ubrogepant (UBRELVY) 50 MG TABS Take 50 mg by mouth as needed (may repeat once after 2 hours).  . Vitamin D, Ergocalciferol, (DRISDOL) 1.25 MG (50000 UT) CAPS capsule Take 1 capsule (50,000 Units total) by mouth 2 (two) times a week. Tuesday and Friday     ROS: Pertinent items noted in HPI and remainder of comprehensive ROS otherwise negative.  Labs/Other Tests and Data Reviewed:    Recent Labs: 06/27/2018: TSH 6.170 08/16/2018: ALT 67; Hemoglobin 14.4; Platelets 229 09/27/2018: BUN 20; Creatinine, Ser 1.12; Potassium 4.2; Sodium 136   Recent Lipid Panel Lab Results  Component Value Date/Time   CHOL 398 (H) 07/26/2018 03:49 PM   TRIG 304 (H) 07/26/2018 03:49 PM   HDL 57 07/26/2018 03:49 PM   CHOLHDL 7.0 (H) 07/26/2018 03:49 PM   CHOLHDL 5.0 06/06/2013 04:54 PM   LDLCALC 280 (H) 07/26/2018 03:49 PM    Wt Readings from Last 3 Encounters:  10/31/18 (!) 325 lb (147.4 kg)  10/30/18 (!) 325 lb (147.4 kg)  10/08/18 (!) 323 lb (146.5 kg)     Exam:    Vital Signs:  BP 118/77   Pulse 70   Ht 5\' 8"  (1.727 m)   Wt (!) 325 lb (147.4 kg)   BMI 49.42 kg/m    General  appearance: alert, no distress and morbidly obese Lungs: No audible wheezing Abdomen: Morbidly obese Extremities: extremities normal, atraumatic, no cyanosis or edema Skin: Skin color, texture, turgor normal. No rashes or lesions  ASSESSMENT & PLAN:    1. Familial hyperlipidemia -Dutch score of 8 2. 0 coronary calcium without obstructive coronary disease by cardiac CT (09/2018) 3. Morbid obesity  Ms. Dody fortunately had no coronary calcium or any obstructive coronary disease but has a significantly elevated lipid profile.  It is hard to understand this however I do believe she has a familial hyperlipidemia.  It also may be related to liver disease.  Her mother had liver failure due to fatty liver disease, but that suggest that it may have actually been due to a dyslipidemia that led to the problem.  Her father also had high triglycerides in the thousands, again is suggesting an inherited disorder.  Either way she will qualify for PCSK9 inhibitor based on current guidelines, high Dutch score and diagnosis of familial hyperlipidemia.  Recommending Repatha 140 mg every 2 weeks.  Plan to repeat lipid profile in 3 to 4 months and follow-up with me afterwards.  COVID-19 Education: The signs and symptoms of COVID-19 were discussed with the patient and how to seek care for testing (follow up with PCP or arrange E-visit).  The importance of social distancing was discussed today.  Patient Risk:   After full review of this patients clinical status, I feel that they are at least moderate risk at this time.  Time:   Today, I have spent 25 minutes with the patient with telehealth technology discussing dyslipidemia, coronary CT findings, weight loss and dietary changes..     Medication Adjustments/Labs and Tests Ordered: Current medicines are reviewed at length with the patient today.  Concerns regarding medicines are outlined above.   Tests Ordered: Orders Placed This Encounter  Procedures  . Lipid  panel    Medication Changes: No orders of the defined types were placed in this encounter.   Disposition:  in 4 month(s)  Chrystie Nose, MD, [facility], FACP  [place]  [facility]  Medical Director of the Advanced Lipid Disorders &  Cardiovascular Risk Reduction Clinic Diplomate of the American Board of Clinical Lipidology Attending Cardiologist  Direct Dial: [phone]  Fax: [phone]  Website:  [email or web address]  Chrystie Nose, MD  11/01/2018 11:44 AM

## 2018-11-01 NOTE — Telephone Encounter (Signed)
Waiting on question response from [facility]

## 2018-11-04 ENCOUNTER — Other Ambulatory Visit: Payer: Self-pay | Admitting: Family Medicine

## 2018-11-04 DIAGNOSIS — E039 Hypothyroidism, unspecified: Secondary | ICD-10-CM

## 2018-11-04 NOTE — Telephone Encounter (Signed)
PA expired per [email or web address] Resubmitted and waiting on questions 11/04/18

## 2018-11-05 ENCOUNTER — Other Ambulatory Visit (HOSPITAL_COMMUNITY)
Admission: RE | Admit: 2018-11-05 | Discharge: 2018-11-05 | Disposition: A | Discharge status: Home / Self Care | Payer: BC Managed Care – PPO | Source: Ambulatory Visit | Attending: Neurology | Admitting: Neurology

## 2018-11-05 DIAGNOSIS — Z20828 Contact with and (suspected) exposure to other viral communicable diseases: Secondary | ICD-10-CM | POA: Diagnosis not present

## 2018-11-05 DIAGNOSIS — Z01812 Encounter for preprocedural laboratory examination: Secondary | ICD-10-CM | POA: Insufficient documentation

## 2018-11-05 LAB — SARS CORONAVIRUS 2 (TAT 6-24 HRS): SARS Coronavirus 2: NEGATIVE

## 2018-11-05 MED ORDER — PRALUENT 150 MG/ML ~~LOC~~ SOAJ: 1.0000 | SUBCUTANEOUS | 11 refills | Status: DC

## 2018-11-05 NOTE — Telephone Encounter (Signed)
Patient called and made aware Praluent is preferred and she has been approved coverage. MyChart message sent with info about medication, copay card.

## 2018-11-05 NOTE — Telephone Encounter (Signed)
Request is approved for the following time period: 11/05/2018 - 11/05/2019

## 2018-11-05 NOTE — Addendum Note (Signed)
Addended by: Fidel Levy on: 11/05/2018 10:24 AM   Modules accepted: Orders

## 2018-11-05 NOTE — Telephone Encounter (Signed)
PA for Praluent submitted Key: [facility] - PA Case ID: [id number]

## 2018-11-08 ENCOUNTER — Ambulatory Visit (INDEPENDENT_AMBULATORY_CARE_PROVIDER_SITE_OTHER): Payer: BC Managed Care – PPO | Admitting: Neurology

## 2018-11-08 ENCOUNTER — Other Ambulatory Visit: Payer: Self-pay

## 2018-11-08 DIAGNOSIS — G4733 Obstructive sleep apnea (adult) (pediatric): Secondary | ICD-10-CM

## 2018-11-08 DIAGNOSIS — G4720 Circadian rhythm sleep disorder, unspecified type: Secondary | ICD-10-CM

## 2018-11-11 NOTE — Telephone Encounter (Deleted)
[facility] received a request from your prescriber for coverage of Praluent. As long as you remain covered by the [facility] and there are no changes to your plan benefits, this request is approved for the following time period: 11/05/2018 - 11/05/2019

## 2018-11-14 NOTE — Procedures (Signed)
S PATIENT'S NAME:  Melanie [name], Melanie [name] DOB:      09/28/1975      MR#:    [id number]     DATE OF RECORDING: 11/08/2018 REFERRING M.D.:  Dr. Nadara Mustard Performed:   CPAP  Titration HISTORY:  43 year old woman with a history of prediabetes, hyperlipidemia, reflux disease, depression, anxiety, allergies, anemia, lower extremity edema, OSA and morbid obesity with a BMI of over 45, who presents for treatment of her OSA. She had a recent sleep study with her oral appliance in place, which showed an AHI of 13.5/hour, REM AHI of 41.9/hour, and O2 nadir of 78%. The patient endorsed the Epworth Sleepiness Scale at 14 points. The patient's weight 325 pounds with a height of 68 (inches), resulting in a BMI of 49.1 kg/m2.   CURRENT MEDICATIONS: Wellbutrin, Zyrtec, Buspar, Hygroton, Celexa, Flonase, Synthroid,, Ativan, Antivert, Mobic, Glucophage, Singulair, Multivitamin, fish oil, Imitrex, Vitamin D  PROCEDURE:  This is a multichannel digital polysomnogram utilizing the SomnoStar 11.2 system.  Electrodes and sensors were applied and monitored per AASM Specifications.   EEG, EOG, Chin and Limb EMG, were sampled at 200 Hz.  ECG, Snore and Nasal Pressure, Thermal Airflow, Respiratory Effort, CPAP Flow and Pressure, Oximetry was sampled at 50 Hz. Digital video and audio were recorded.      The patient was fitted with a medium F30i FFM. CPAP was initiated at 6 cmH20 with heated humidity per AASM standards and pressure was advanced to 17 cmH20 because of hypopneas, apneas and desaturations.  At a PAP pressure of 17 cmH20, her AHI was 6.3/hour. Due to high pressure requirement, she was switched to BiPAP of 17/13 cm and further titrated to 18/14 cm, at which point her AHI was 0/hour and O2 nadir 92% with supine NREM sleep achieved. there was a reduction of the AHI to 0 with improvement of sleep apnea.  Lights Out was at 21:17 and Lights On at 04:59. Total recording time (TRT) was 447.5 minutes, with a total sleep time (TST) of  377.5 minutes. The patient's sleep latency was 62 minutes, which is delayed. REM latency was 141.5 minutes, which is delayed. The sleep efficiency was 84.4 %.    SLEEP ARCHITECTURE: WASO (Wake after sleep onset) was 8 minutes with minimal sleep fragmentation noted. There were 21.5 minutes in Stage N1, 156.5 minutes Stage N2, 112 minutes Stage N3 and 87.5 minutes in Stage REM.  The percentage of Stage N1 was 5.7%, Stage N2 was 41.5%, Stage N3 was 29.7%, which is increased, and Stage R (REM sleep) was 23.2%, which is normal. The arousals were noted as: 32 were spontaneous, 0 were associated with PLMs, 28 were associated with respiratory events.  RESPIRATORY ANALYSIS:  There was a total of 85 respiratory events: 9 obstructive apneas, 0 central apneas and 0 mixed apneas with a total of 9 apneas and an apnea index (AI) of 1.4 /hour. There were 76 hypopneas with a hypopnea index of 12.1/hour. The patient also had 0 respiratory event related arousals (RERAs).      The total APNEA/HYPOPNEA INDEX  (AHI) was 13.5 /hour and the total RESPIRATORY DISTURBANCE INDEX was 13.5 /hour  8 events occurred in REM sleep and 77 events in NREM. The REM AHI was 5.5 /hour versus a non-REM AHI of 15.9 /hour.  The patient spent 377.5 minutes of total sleep time in the supine position and 0 minutes in non-supine. The supine AHI was 13.5, versus a non-supine AHI of 0.0.  OXYGEN SATURATION & C02:  The  baseline 02 saturation was 94%, with the lowest being 84%. Time spent below 89% saturation equaled 17 minutes.  PERIODIC LIMB MOVEMENTS:  The patient had a total of 0 Periodic Limb Movements. The Periodic Limb Movement (PLM) index was 0 and the PLM Arousal index was 0 /hour.  Audio and video analysis did not show any abnormal or unusual movements, behaviors, phonations or vocalizations. The patient took no bathroom breaks. The EKG was in keeping with normal sinus rhythm (NSR).  Post-study, the patient indicated that sleep was better  than usual.   DIAGNOSIS 1. Obstructive Sleep Apnea  2. Dysfunctions associated with sleep stages or arousals from sleep  PLANS/RECOMMENDATIONS: 1. This study demonstrates resolution of the patient's obstructive sleep apnea with BiPAP therapy. I will, therefore, start the patient on home BiPAP treatment at a pressure of 18/14 cm via medium full face mask, with heated humidity. The patient should be reminded to be fully compliant with PAP therapy to improve sleep related symptoms and decrease long term cardiovascular risks. The patient should be reminded, that it may take up to 3 months to get fully used to using PAP with all planned sleep. The earlier full compliance is achieved, the better long term compliance tends to be. Please note that untreated obstructive sleep apnea may carry additional perioperative morbidity. Patients with significant obstructive sleep apnea should receive perioperative PAP therapy and the surgeons and particularly the anesthesiologist should be informed of the diagnosis and the severity of the sleep disordered breathing.  2. This study shows some sleep fragmentation and mildly abnormal sleep stage percentages; these are nonspecific findings and per se do not signify an intrinsic sleep disorder or a cause for the patient's sleep-related symptoms. Causes include (but are not limited to) the first night effect of the sleep study, circadian rhythm disturbances, medication effect or an underlying mood disorder or medical problem.  3. The patient should be cautioned not to drive, work at heights, or operate dangerous or heavy equipment when tired or sleepy. Review and reiteration of good sleep hygiene measures should be pursued with any patient. 4. The patient will be seen in follow-up in the sleep clinic at [facility] for discussion of the test results, symptom and treatment compliance review, further management strategies, etc. The referring provider will be notified of the test  results.  I certify that I have reviewed the entire raw data recording prior to the issuance of this report in accordance with the Standards of Accreditation of the American Academy of Sleep Medicine (AASM)    Huston Foley, MD, PhD Diplomat, American Board of Neurology and Sleep Medicine (Neurology and Sleep Medicine)

## 2018-11-14 NOTE — Progress Notes (Signed)
capp

## 2018-11-14 NOTE — Progress Notes (Signed)
Patient referred by Dr. Carlota Raspberry for recurrent HAs, seen by me on 08/15/18 in [facility], prior Dx of OSA, had PSG with oral appl in place on 10/03/18, Patient had a CPAP titration study on 11/08/18.  Please call and inform patient that I have entered an order for treatment with positive airway pressure (PAP) treatment for obstructive sleep apnea (OSA). She did well during the latest sleep study with BiPAP. We will, therefore, arrange for a machine for home use through a DME (durable medical equipment) company of Her choice; and I will see the patient back in follow-up in about 10 weeks. Please also explain to the patient that I will be looking out for compliance data, which can be downloaded from the machine (stored on an SD card, that is inserted in the machine) or via remote access through a modem, that is built into the machine. At the time of the followup appointment we will discuss sleep study results and how it is going with PAP treatment at home. Please advise patient to bring Her machine at the time of the first FU visit, even though this is cumbersome. Bringing the machine for every visit after that will likely not be needed, but often helps for the first visit to troubleshoot if needed. Please re-enforce the importance of compliance with treatment and the need for [place] to monitor compliance data - often an insurance requirement and actually good feedback for the patient as far as how they are doing.  Also remind patient, that any interim PAP machine or mask issues should be first addressed with the DME company, as they can often help better with technical and mask fit issues. Please ask if patient has a preference regarding DME company.  Please also make sure, the patient has a follow-up appointment with me in about 10 weeks from the setup date, thanks. May see one of our nurse practitioners if needed for proper timing of the FU appointment.  Please fax or rout report to the referring provider. Thanks,   Star Age, MD, PhD [facility])

## 2018-11-14 NOTE — Addendum Note (Signed)
Addended by: Star Age on: 11/14/2018 07:08 PM   Modules accepted: Orders

## 2018-11-19 ENCOUNTER — Telehealth: Payer: Self-pay | Admitting: Neurology

## 2018-11-19 NOTE — Telephone Encounter (Signed)
I called pt. I advised pt that Dr. Rexene Alberts reviewed their sleep study results and found that pt tolerated BiPAP best. Dr. Rexene Alberts recommends that pt start BiPAP machine 18/14 cm water pressure. I reviewed PAP compliance expectations with the pt. Pt is agreeable to starting a CPAP. I advised pt that an order will be sent to a DME, [facility], and [facility] will call the pt within about one week after they file with the pt's insurance. [facility] will show the pt how to use the machine, fit for masks, and troubleshoot the CPAP if needed. A follow up appt was made for insurance purposes with Dr. Rexene Alberts on Dec 3,2020 at 2 pm. Pt verbalized understanding to arrive 15 minutes early and bring their CPAP. A letter with all of this information in it will be mailed to the pt as a reminder. I verified with the pt that the address we have on file is correct. Pt verbalized understanding of results. Pt had no questions at this time but was encouraged to call back if questions arise. I have sent the order to [facility] and have received confirmation that they have received the order.

## 2018-11-19 NOTE — Telephone Encounter (Signed)
-----   Message from Star Age, MD sent at 11/14/2018  7:08 PM EDT ----- Patient referred by Dr. Carlota Raspberry for recurrent HAs, seen by me on 08/15/18 in [facility], prior Dx of OSA, had PSG with oral appl in place on 10/03/18, Patient had a CPAP titration study on 11/08/18.  Please call and inform patient that I have entered an order for treatment with positive airway pressure (PAP) treatment for obstructive sleep apnea (OSA). She did well during the latest sleep study with BiPAP. We will, therefore, arrange for a machine for home use through a DME (durable medical equipment) company of Her choice; and I will see the patient back in follow-up in about 10 weeks. Please also explain to the patient that I will be looking out for compliance data, which can be downloaded from the machine (stored on an SD card, that is inserted in the machine) or via remote access through a modem, that is built into the machine. At the time of the followup appointment we will discuss sleep study results and how it is going with PAP treatment at home. Please advise patient to bring Her machine at the time of the first FU visit, even though this is cumbersome. Bringing the machine for every visit after that will likely not be needed, but often helps for the first visit to troubleshoot if needed. Please re-enforce the importance of compliance with treatment and the need for [place] to monitor compliance data - often an insurance requirement and actually good feedback for the patient as far as how they are doing.  Also remind patient, that any interim PAP machine or mask issues should be first addressed with the DME company, as they can often help better with technical and mask fit issues. Please ask if patient has a preference regarding DME company.  Please also make sure, the patient has a follow-up appointment with me in about 10 weeks from the setup date, thanks. May see one of our nurse practitioners if needed for proper timing of the FU appointment.   Please fax or rout report to the referring provider. Thanks,   Star Age, MD, PhD [facility])

## 2018-11-20 ENCOUNTER — Ambulatory Visit (INDEPENDENT_AMBULATORY_CARE_PROVIDER_SITE_OTHER): Payer: BC Managed Care – PPO | Admitting: Physician Assistant

## 2018-11-20 ENCOUNTER — Other Ambulatory Visit: Payer: Self-pay

## 2018-11-20 ENCOUNTER — Encounter (INDEPENDENT_AMBULATORY_CARE_PROVIDER_SITE_OTHER): Payer: Self-pay | Admitting: Physician Assistant

## 2018-11-20 VITALS — BP 111/76 | HR 80 | Temp 98.2°F | Ht 68.0 in | Wt 324.0 lb

## 2018-11-20 DIAGNOSIS — Z6841 Body Mass Index (BMI) 40.0 and over, adult: Secondary | ICD-10-CM

## 2018-11-20 DIAGNOSIS — R7303 Prediabetes: Secondary | ICD-10-CM | POA: Diagnosis not present

## 2018-11-20 NOTE — Progress Notes (Signed)
Office: [phone]  /  Fax: [phone]   HPI:   Chief Complaint: OBESITY Melanie [name] is here to discuss her progress with her obesity treatment plan. She is on the portion control better and make smarter food choices plan and she is following her eating plan approximately 75 % of the time. She states she is exercising 0 minutes 0 times per week. Melanie [name] reports that she continues to enjoy the portion control and making smarter food choices plan and she would like to continue. She is less stressed with this eating plan. Her weight is (!) 324 lb (147 kg) today and has had a weight loss of 1 pound over a period of 3 weeks since her last visit. She has lost 14 lbs since starting treatment with [place].  Pre-Diabetes Melanie [name] has a diagnosis of prediabetes based on her elevated Hgb A1c and was informed this puts her at greater risk of developing diabetes. She is taking metformin currently and continues to work on diet and exercise to decrease risk of diabetes. She denies nausea, vomiting or hypoglycemia.  ASSESSMENT AND PLAN:  Prediabetes  Class 3 severe obesity with serious comorbidity and body mass index (BMI) of 45.0 to 49.9 in adult, unspecified obesity type [facility])  PLAN:  Pre-Diabetes Melanie [name] will continue to work on weight loss, exercise, and decreasing simple carbohydrates in her diet to help decrease the risk of diabetes. We dicussed metformin including benefits and risks. She was informed that eating too many simple carbohydrates or too many calories at one sitting increases the likelihood of GI side effects. Melanie [name] will continue with metformin for now and a prescription was not written today. Melanie [name] agreed to follow up with [place] as directed to monitor her progress.  I spent > than 50% of the 15 minute visit on counseling as documented in the note.  Obesity Melanie [name] is currently in the action stage of change. As such, her goal is to continue with weight loss efforts She has agreed to portion control better  and make smarter food choices, such as increase vegetables and decrease simple carbohydrates  Melanie [name] has been instructed to work up to a goal of 150 minutes of combined cardio and strengthening exercise per week for weight loss and overall health benefits. We discussed the following Behavioral Modification Strategies today: keeping healthy foods in the home and work on meal planning and easy cooking plans  Melanie [name] has agreed to follow up with our clinic in 3 weeks. She was informed of the importance of frequent follow up visits to maximize her success with intensive lifestyle modifications for her multiple health conditions.  ALLERGIES: Allergies  Allergen Reactions   Welchol [Colesevelam] Shortness Of Breath   Lidocaine Other (See Comments)    Flu like sx's   Relafen [Nabumetone] Other (See Comments)    migraine   Celebrex [Celecoxib] Rash   Sulfa Antibiotics Rash    MEDICATIONS: Current Outpatient Medications on File Prior to Visit  Medication Sig Dispense Refill   Alirocumab (PRALUENT) 150 MG/ML SOAJ Inject 1 Dose into the skin every 14 (fourteen) days. 2 pen 11   buPROPion (WELLBUTRIN SR) 150 MG 12 hr tablet Take 1 tablet (150 mg total) by mouth 2 (two) times daily. 180 tablet 0   busPIRone (BUSPAR) 15 MG tablet TAKE ONE TABLET BY MOUTH TWICE A DAY OR AS DIRECTED. (Patient taking differently: Take 15 mg by mouth 2 (two) times daily. TAKE ONE TABLET BY MOUTH TWICE A DAY OR AS DIRECTED.) 180 tablet 3  cetirizine (ZYRTEC) 10 MG tablet Take 10 mg by mouth at bedtime.      chlorthalidone (HYGROTON) 25 MG tablet Take 1 tablet (25 mg total) by mouth daily. 90 tablet 0   citalopram (CELEXA) 40 MG tablet Take 1 tablet (40 mg total) by mouth daily. 90 tablet 1   fluticasone (FLONASE) 50 MCG/ACT nasal spray Place 2 sprays into both nostrils daily. (Patient taking differently: Place 2 sprays into both nostrils daily as needed for allergies. ) 48 g 3   levothyroxine (SYNTHROID) 88 MCG  tablet TAKE ONE TABLET BY MOUTH DAILY 30 tablet 0   LORazepam (ATIVAN) 1 MG tablet Take 1 tablet (1 mg total) by mouth 2 (two) times daily as needed for anxiety. 20 tablet 0   meclizine (ANTIVERT) 25 MG tablet TAKE ONE TABLET BY MOUTH THREE TIMES A DAY AS NEEDED FOR DIZZINESS (Patient taking differently: Take 25 mg by mouth 3 (three) times daily as needed for dizziness. ) 30 tablet 0   meloxicam (MOBIC) 15 MG tablet Take 15 mg by mouth daily as needed for pain.      metFORMIN (GLUCOPHAGE) 500 MG tablet Take 1 tablet (500 mg total) by mouth daily with breakfast. 90 tablet 0   metoprolol tartrate (LOPRESSOR) 100 MG tablet Take ONE tablet TWO HOURS prior to test. 1 tablet 0   montelukast (SINGULAIR) 10 MG tablet Take 1 tablet (10 mg total) by mouth at bedtime. 30 tablet 11   Multiple Vitamin (MULTIVITAMIN) tablet Take 1 tablet by mouth daily.     Omega-3 Fatty Acids (FISH OIL PO) Take 1 capsule by mouth 2 (two) times a day.      SUMAtriptan (IMITREX) 50 MG tablet TAKE ONE TABLET BY MOUTH AT ONSET OF HEADACHE; MAY REPEAT ONE TABLET IN 2 HOURS IF NEEDED. (Patient taking differently: Take 50 mg by mouth See admin instructions. Take 1 tablet at onset of headache; may repeat one tablet in 2 hours if needed.) 10 tablet 0   Ubrogepant (UBRELVY) 50 MG TABS Take 50 mg by mouth as needed (may repeat once after 2 hours). 20 tablet 3   Vitamin D, Ergocalciferol, (DRISDOL) 1.25 MG (50000 UT) CAPS capsule Take 1 capsule (50,000 Units total) by mouth 2 (two) times a week. Tuesday and Friday 10 capsule 0   No current facility-administered medications on file prior to visit.     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    Migraines    LIGHT SENSITIVITY , NOW ON IMITREX SINCE 08-02-2018,NOTICES IMPROVEMENT    Multiple food allergies    Muscle pain    Numbness and tingling in both hands    Pre-diabetes    Shortness of breath    was due to a medication side effect, now off medication , SOB resolved    Sleep apnea    Stress    Swelling of both lower extremities     PAST SURGICAL HISTORY: Past Surgical History:  Procedure Laterality Date   ELBOW LIGAMENT RECONSTRUCTION Right 08/21/2018   Procedure: LATERAL COLLATERAL LIGAMENT REPAIR;  Surgeon: Hiram Gash, MD;  Location: [facility];  Service: Orthopedics;  Laterality: Right;   FINGER SURGERY Left     index finger   KNEE SURGERY Left    LIVER BIOPSY N/A 05/15/2014   Procedure: LIVER BIOPSY;  Surgeon: Inda Castle, MD;  Location: [facility];  Service: Endoscopy;  Laterality: N/A;  ultrasound to mark the liver   TENNIS ELBOW RELEASE/NIRSCHEL PROCEDURE Right 08/21/2018   Procedure: TENNIS ELBOW RELEASE/NIRSCHEL PROCEDURE;  Surgeon: Hiram Gash, MD;  Location: [facility];  Service: Orthopedics;  Laterality: Right;   TONSILLECTOMY AND ADENOIDECTOMY  1978   TYMPANOSTOMY TUBE PLACEMENT     ULNAR NERVE TRANSPOSITION Right 08/21/2018   Procedure: ULNAR NERVE DECOMPRESSION/TRANSPOSITION;  Surgeon: Hiram Gash, MD;  Location: [facility];  Service: Orthopedics;  Laterality: Right;   wrist cyst Left     SOCIAL HISTORY: Social History   Tobacco Use   Smoking status: Never Smoker   Smokeless tobacco: Never Used  Substance Use Topics   Alcohol use: No    Alcohol/week: 0.0 standard drinks   Drug use: No    FAMILY HISTORY: Family History  Problem Relation Age of Onset   Diabetes Mother    Liver disease Mother        ESLD- unknown etiology   Obesity Mother    Diabetes Maternal Grandmother    Diabetes Paternal Grandmother    Breast cancer Paternal Grandmother    Stroke Maternal Grandfather    Hyperlipidemia Father      ROS: Review of Systems  Constitutional: Positive for weight loss.  Gastrointestinal: Negative for nausea and vomiting.  Endo/Heme/Allergies:       Negative for hypoglycemia    PHYSICAL EXAM: Blood pressure 111/76, pulse 80, temperature 98.2 F (36.8 C), temperature source Oral, height 5' 8"  (1.727 m), weight (!) 324 lb (147 kg), SpO2 94 %. Body mass index is 49.26 kg/m. Physical Exam Vitals signs reviewed.  Constitutional:      Appearance: Normal appearance. She is well-developed. She is obese.  Cardiovascular:     Rate and Rhythm: Normal rate.  Pulmonary:     Effort: Pulmonary effort is normal.  Musculoskeletal: Normal range of motion.  Skin:    General: Skin is warm and dry.  Neurological:     Mental Status: She is alert and oriented to person, place, and time.  Psychiatric:        Mood and Affect: Mood normal.        Behavior: Behavior normal.     RECENT LABS AND TESTS: BMET    Component Value Date/Time   NA 136 09/27/2018 1557   K 4.2 09/27/2018 1557   CL 95 (L) 09/27/2018 1557   CO2 25 09/27/2018 1557   GLUCOSE 97 09/27/2018 1557   GLUCOSE 127 (H) 08/16/2018 1048   BUN 20 09/27/2018 1557   CREATININE 1.12 (H) 09/27/2018 1557   CREATININE 0.85 09/08/2015 1514   CALCIUM 10.5 (H) 09/27/2018 1557   GFRNONAA 60 09/27/2018 1557   GFRNONAA 85 09/08/2015 1514   GFRAA 69 09/27/2018 1557   GFRAA >89 09/08/2015 1514   Lab Results  Component Value Date   HGBA1C 5.5 07/25/2018   HGBA1C 5.5 05/01/2018   HGBA1C 5.6 12/13/2017   HGBA1C 5.0 09/05/2017   HGBA1C 5.8 (H) 05/23/2017   Lab Results  Component Value Date   INSULIN 39.2 (H) 07/25/2018   INSULIN 30.4 (H) 05/01/2018   INSULIN 39.0 (H) 12/13/2017   INSULIN 24.3 09/05/2017   INSULIN 30.4 (H) 05/23/2017   CBC  Component Value Date/Time   WBC 6.2 08/16/2018 1048   RBC 4.46 08/16/2018 1048   HGB 14.4 08/16/2018 1048   HGB 14.6 07/26/2018 1549   HCT 41.2 08/16/2018 1048   HCT 41.6 07/26/2018  1549   PLT 229 08/16/2018 1048   PLT 254 07/26/2018 1549   MCV 92.4 08/16/2018 1048   MCV 90 07/26/2018 1549   MCH 32.3 08/16/2018 1048   MCHC 35.0 08/16/2018 1048   RDW 12.2 08/16/2018 1048   RDW 12.8 07/26/2018 1549   LYMPHSABS 2.8 05/23/2017 1249   MONOABS 0.4 10/27/2013 1604   EOSABS 0.3 05/23/2017 1249   BASOSABS 0.0 05/23/2017 1249   Iron/TIBC/Ferritin/ %Sat    Component Value Date/Time   IRON 95 06/27/2013 1645   TIBC 371 06/27/2013 1645   FERRITIN 401 (H) 06/27/2013 1645   IRONPCTSAT 26 06/27/2013 1645   Lipid Panel     Component Value Date/Time   CHOL 398 (H) 07/26/2018 1549   TRIG 304 (H) 07/26/2018 1549   HDL 57 07/26/2018 1549   CHOLHDL 7.0 (H) 07/26/2018 1549   CHOLHDL 5.0 06/06/2013 1654   VLDL 27 06/06/2013 1654   LDLCALC 280 (H) 07/26/2018 1549   Hepatic Function Panel     Component Value Date/Time   PROT 7.0 08/16/2018 1048   PROT 6.3 07/26/2018 1549   ALBUMIN 4.0 08/16/2018 1048   ALBUMIN 4.0 07/26/2018 1549   AST 41 08/16/2018 1048   ALT 67 (H) 08/16/2018 1048   ALKPHOS 118 08/16/2018 1048   BILITOT 1.3 (H) 08/16/2018 1048   BILITOT 0.8 07/26/2018 1549   BILIDIR 0.2 04/07/2015 1349   IBILI 1.0 04/07/2015 1349      Component Value Date/Time   TSH 6.170 (H) 06/27/2018 1139   TSH 5.010 (H) 05/01/2018 1031   TSH 5.570 (H) 12/13/2017 1014    Results for Melanie [name], Melanie [name] (MRN [id number]) as of 11/20/2018 17:59  Ref. Range 07/25/2018 11:00  Vitamin D, 25-Hydroxy Latest Ref Range: 30.0 - 100.0 ng/mL 37.0    OBESITY BEHAVIORAL INTERVENTION VISIT  Today's visit was # 29  Starting weight: 338 lbs Starting date: 05/23/2017 Today's weight : 324 lbs  Today's date: 11/20/2018 Total lbs lost to date: 14    11/20/2018  Height 5' 8"  (1.727 m)  Weight 324 lb (147 kg) (A)  BMI (Calculated) 49.28  BLOOD PRESSURE - SYSTOLIC 834  BLOOD PRESSURE - DIASTOLIC 76   Body Fat % 19.6 %  Total Body Water (lbs) 110 lbs    ASK: We discussed the diagnosis of  obesity with Melanie [name] today and Melanie [name] agreed to give [place] permission to discuss obesity behavioral modification therapy today.  ASSESS: Melanie [name] has the diagnosis of obesity and her BMI today is 49.28 Melanie [name] is in the action stage of change   ADVISE: Melanie [name] was educated on the multiple health risks of obesity as well as the benefit of weight loss to improve her health. She was advised of the need for long term treatment and the importance of lifestyle modifications to improve her current health and to decrease her risk of future health problems.  AGREE: Multiple dietary modification options and treatment options were discussed and  Melanie [name] agreed to follow the recommendations documented in the above note.  ARRANGE: Melanie [name] was educated on the importance of frequent visits to treat obesity as outlined per CMS and USPSTF guidelines and agreed to schedule her next follow up appointment today.  IDoreene Nest, am acting as Location manager for [employer],  PA-C I, Abby Potash, PA-C have reviewed above note and agree with its content

## 2018-11-26 ENCOUNTER — Other Ambulatory Visit: Payer: Self-pay | Admitting: Family Medicine

## 2018-11-26 ENCOUNTER — Ambulatory Visit: Payer: BC Managed Care – PPO | Admitting: Neurology

## 2018-11-26 ENCOUNTER — Other Ambulatory Visit (INDEPENDENT_AMBULATORY_CARE_PROVIDER_SITE_OTHER): Payer: Self-pay | Admitting: Physician Assistant

## 2018-11-26 DIAGNOSIS — F419 Anxiety disorder, unspecified: Secondary | ICD-10-CM

## 2018-11-26 DIAGNOSIS — E559 Vitamin D deficiency, unspecified: Secondary | ICD-10-CM

## 2018-11-26 DIAGNOSIS — E039 Hypothyroidism, unspecified: Secondary | ICD-10-CM

## 2018-11-26 NOTE — Telephone Encounter (Signed)
Requested medication (s) are due for refill today:yes   Requested medication (s) are on the active medication list: yes  Last refill:  10/07/2018  Future visit scheduled: no  Notes to clinic: review for refill   Requested Prescriptions  Pending Prescriptions Disp Refills   levothyroxine (SYNTHROID) 88 MCG tablet [Pharmacy Med Name: LEVOTHYROXINE 88 MCG TABLET] 30 tablet 0    Sig: TAKE ONE TABLET BY MOUTH DAILY     Endocrinology:  Hypothyroid Agents Failed - 11/26/2018 10:12 AM      Failed - TSH needs to be rechecked within 3 months after an abnormal result. Refill until TSH is due.      Failed - TSH in normal range and within 360 days    TSH  Date Value Ref Range Status  06/27/2018 6.170 (H) 0.450 - 4.500 uIU/mL Final         Passed - Valid encounter within last 12 months    Recent Outpatient Visits          3 months ago Hypercalcemia   Primary Care at [facility], Arlie Solomons, MD   4 months ago Malaise and fatigue   [facility] at [facility], Ranell Patrick, MD   4 months ago Acquired hypothyroidism   [facility] Care at [facility], Arlie Solomons, MD   5 months ago Statin intolerance   [facility] at [facility], Arlie Solomons, MD   5 months ago Statin intolerance   Primary Care at [facility], Arlie Solomons, MD      Future Appointments            In 1 month Hilty, Nadean Corwin, MD [facility]

## 2018-11-26 NOTE — Telephone Encounter (Signed)
Requested medication (s) are due for refill today: yes  Requested medication (s) are on the active medication list: yes  Last refill:  07/09/2018  Future visit scheduled: no  Notes to clinic: review for refill   Requested Prescriptions  Pending Prescriptions Disp Refills   citalopram (CELEXA) 40 MG tablet [Pharmacy Med Name: Citalopram Hydrobromide 2m Tablet] 90 tablet 1    Sig: Take 1 tablet by mouth daily.     Psychiatry:  Antidepressants - SSRI Passed - 11/26/2018  6:46 AM      Passed - Valid encounter within last 6 months    Recent Outpatient Visits          3 months ago [facility]cal[facility]   [facility]   4 months ago Malaise and fatigue   [facility]   4 months ago Acquired hypothyroidism   [facility]   5 months ago Statin intolerance   [facility]   5 months ago Statin intolerance   [facility]      Future Appointments            In 1 month Hilty, [facility] [place] Completed PHQ-2 or PHQ-9 in the last 360 days.

## 2018-12-16 ENCOUNTER — Encounter (INDEPENDENT_AMBULATORY_CARE_PROVIDER_SITE_OTHER): Payer: Self-pay | Admitting: Physician Assistant

## 2018-12-18 ENCOUNTER — Ambulatory Visit (INDEPENDENT_AMBULATORY_CARE_PROVIDER_SITE_OTHER): Payer: BC Managed Care – PPO | Admitting: Physician Assistant

## 2018-12-24 ENCOUNTER — Other Ambulatory Visit (INDEPENDENT_AMBULATORY_CARE_PROVIDER_SITE_OTHER): Payer: Self-pay | Admitting: Physician Assistant

## 2018-12-24 ENCOUNTER — Other Ambulatory Visit: Payer: Self-pay | Admitting: Family Medicine

## 2018-12-24 ENCOUNTER — Ambulatory Visit (INDEPENDENT_AMBULATORY_CARE_PROVIDER_SITE_OTHER): Payer: BC Managed Care – PPO | Admitting: Family Medicine

## 2018-12-24 ENCOUNTER — Other Ambulatory Visit: Payer: Self-pay

## 2018-12-24 VITALS — BP 107/67 | HR 60 | Temp 98.4°F | Ht 68.0 in | Wt 325.0 lb

## 2018-12-24 DIAGNOSIS — F418 Other specified anxiety disorders: Secondary | ICD-10-CM

## 2018-12-24 DIAGNOSIS — E039 Hypothyroidism, unspecified: Secondary | ICD-10-CM

## 2018-12-24 DIAGNOSIS — Z9189 Other specified personal risk factors, not elsewhere classified: Secondary | ICD-10-CM

## 2018-12-24 DIAGNOSIS — E7849 Other hyperlipidemia: Secondary | ICD-10-CM | POA: Diagnosis not present

## 2018-12-24 DIAGNOSIS — E038 Other specified hypothyroidism: Secondary | ICD-10-CM | POA: Diagnosis not present

## 2018-12-24 DIAGNOSIS — R7303 Prediabetes: Secondary | ICD-10-CM

## 2018-12-24 DIAGNOSIS — E559 Vitamin D deficiency, unspecified: Secondary | ICD-10-CM

## 2018-12-24 DIAGNOSIS — Z6841 Body Mass Index (BMI) 40.0 and over, adult: Secondary | ICD-10-CM

## 2018-12-24 MED ORDER — VITAMIN D (ERGOCALCIFEROL) 1.25 MG (50000 UNIT) PO CAPS: 50000.0000 [IU] | ORAL_CAPSULE | ORAL | 0 refills | Status: DC

## 2018-12-25 LAB — COMPREHENSIVE METABOLIC PANEL
ALT: 55 IU/L — ABNORMAL HIGH (ref 0–32)
AST: 41 IU/L — ABNORMAL HIGH (ref 0–40)
Albumin/Globulin Ratio: 2 (ref 1.2–2.2)
Albumin: 4.1 g/dL (ref 3.8–4.8)
Alkaline Phosphatase: 157 IU/L — ABNORMAL HIGH (ref 39–117)
BUN/Creatinine Ratio: 18 (ref 9–23)
BUN: 20 mg/dL (ref 6–24)
Bilirubin Total: 0.9 mg/dL (ref 0.0–1.2)
CO2: 27 mmol/L (ref 20–29)
Calcium: 10.4 mg/dL — ABNORMAL HIGH (ref 8.7–10.2)
Chloride: 100 mmol/L (ref 96–106)
Creatinine, Ser: 1.1 mg/dL — ABNORMAL HIGH (ref 0.57–1.00)
GFR calc Af Amer: 71 mL/min/{1.73_m2} (ref >59)
GFR calc non Af Amer: 61 mL/min/{1.73_m2} (ref >59)
Globulin, Total: 2.1 g/dL (ref 1.5–4.5)
Glucose: 134 mg/dL — ABNORMAL HIGH (ref 65–99)
Potassium: 3.6 mmol/L (ref 3.5–5.2)
Sodium: 139 mmol/L (ref 134–144)
Total Protein: 6.2 g/dL (ref 6.0–8.5)

## 2018-12-25 LAB — LIPID PANEL WITH LDL/HDL RATIO
Cholesterol, Total: 227 mg/dL — ABNORMAL HIGH (ref 100–199)
HDL: 60 mg/dL (ref >39)
LDL Chol Calc (NIH): 135 mg/dL — ABNORMAL HIGH (ref 0–99)
LDL/HDL Ratio: 2.3 ratio (ref 0.0–3.2)
Triglycerides: 180 mg/dL — ABNORMAL HIGH (ref 0–149)
VLDL Cholesterol Cal: 32 mg/dL (ref 5–40)

## 2018-12-25 LAB — T3: T3, Total: 118 ng/dL (ref 71–180)

## 2018-12-25 LAB — T4, FREE: Free T4: 1.27 ng/dL (ref 0.82–1.77)

## 2018-12-25 LAB — TSH: TSH: 3.95 u[IU]/mL (ref 0.450–4.500)

## 2018-12-25 LAB — VITAMIN D 25 HYDROXY (VIT D DEFICIENCY, FRACTURES): Vit D, 25-Hydroxy: 44.8 ng/mL (ref 30.0–100.0)

## 2018-12-25 LAB — HEMOGLOBIN A1C
Est. average glucose Bld gHb Est-mCnc: 114 mg/dL
Hgb A1c MFr Bld: 5.6 % (ref 4.8–5.6)

## 2018-12-25 LAB — INSULIN, RANDOM: INSULIN: 46.3 u[IU]/mL — ABNORMAL HIGH (ref 2.6–24.9)

## 2018-12-25 NOTE — Progress Notes (Signed)
Office: [phone]  /  Fax: [phone]   HPI:   Chief Complaint: OBESITY Melanie [name] is here to discuss her progress with her obesity treatment plan. She is on the portion control better and make smarter food choices plan and she is following her eating plan approximately 85 % of the time. She states she is exercising 0 minutes 0 times per week. Melanie [name] likes the portion control, Arts administrator. It has reduced her stress to switch from the Category 3 plan. She is eating three meals per day and she is focusing on protein. Melanie [name] feels that sweets is her biggest temptation. Her weight is (!) 325 lb (147.4 kg) today and has had a weight gain of 1 pound over a period of 5 weeks since her last visit. She has lost 13 lbs since starting treatment with [place].  Vitamin D deficiency Melanie [name] has a diagnosis of vitamin D deficiency. Melanie [name] is currently taking vit D and she denies nausea, vomiting or muscle weakness. Her last vitamin D level was at 37 on 07/25/18 and was not at goal.  Pre-Diabetes Melanie [name] has a diagnosis of prediabetes based on her elevated Hgb A1c and was informed this puts her at greater risk of developing diabetes. Melanie [name] is on metformin daily and she denies polyphagia. She continues to work on diet and exercise to decrease risk of diabetes.  Lab Results  Component Value Date   HGBA1C 5.6 12/24/2018    Hypothyroid Melanie [name] has a diagnosis of hypothyroidism. She is stable on 88 mcg of levothyroxine. She admits hot or cold intolerance and she denies palpitations.  Familial Hyperlipidemia Melanie [name] has familial hyperlipidemia and she is on Praluent per cardiology. Her last LDL was at 280, HDL was at 57 and her triglycerides were at 304. She has been trying to improve her cholesterol levels with intensive lifestyle modification including a low saturated fat diet, exercise and weight loss. She denies any chest pain or shortness of breath.  At risk for cardiovascular disease Melanie [name] is at a higher than  average risk for cardiovascular disease due to obesity, prediabetes and hyperlipidemia. She currently denies any chest pain.  Depression with emotional eating behaviors She is taking bupropion and feels this helps with cravings and stress eating.  Melanie [name] is struggling with emotional eating and using food for comfort to the extent that it is negatively impacting her health. She often snacks when she is not hungry. Melanie [name] sometimes feels she is out of control and then feels guilty that she made poor food choices. She has been working on behavior modification techniques to help reduce her emotional eating and has been somewhat successful. She shows no sign of suicidal or homicidal ideations.  ASSESSMENT AND PLAN:  Vitamin D deficiency - Plan: VITAMIN D 25 Hydroxy (Vit-D Deficiency, Fractures), Vitamin D, Ergocalciferol, (DRISDOL) 1.25 MG (50000 UT) CAPS capsule, DISCONTINUED: Vitamin D, Ergocalciferol, (DRISDOL) 1.25 MG (50000 UT) CAPS capsule  Prediabetes - Plan: Comprehensive metabolic panel, Hemoglobin A1c, Insulin, random  Other specified hypothyroidism - Plan: T3, T4, free, TSH  Other hyperlipidemia - Plan: Lipid Panel With LDL/HDL Ratio  Depression with anxiety  At risk for heart disease  Class 3 severe obesity with serious comorbidity and body mass index (BMI) of 45.0 to 49.9 in adult, unspecified obesity type (HCC)  PLAN:  Vitamin D Deficiency Melanie [name] was informed that low vitamin D levels contributes to fatigue and are associated with obesity, breast, and colon cancer. Melanie [name] agrees to continue to take prescription Vit D @50 ,000 IU  every 3 days #10 with no refills and she will follow up for routine testing of vitamin D, at least 2-3 times per year. She was informed of the risk of over-replacement of vitamin D and agrees to not increase her dose unless she discusses this with [place] first. Melanie [name] agrees to follow up with our clinic in 3 weeks.  Pre-Diabetes Melanie [name] will continue to work on weight  loss, exercise, and decreasing simple carbohydrates in her diet to help decrease the risk of diabetes. She was informed that eating too many simple carbohydrates or too many calories at one sitting increases the likelihood of GI side effects. We will check A1c, fasting insulin and fasting glucose. Melanie [name] will continue metformin and follow up with [place] as directed to monitor her progress.  Hypothyroid Melanie [name] was informed of the importance of good thyroid control to help with weight loss efforts. She was also informed that supertherapeutic thyroid levels are dangerous and will not improve weight loss results. We will check thyroid panel today.  Familial Hyperlipidemia Melanie [name] was informed of the American Heart Association Guidelines emphasizing intensive lifestyle modifications as the first line treatment for hyperlipidemia. We discussed many lifestyle modifications today in depth, and Melanie [name] will continue to work on decreasing saturated fats such as fatty red meat, butter and many fried foods. She will also increase vegetables and lean protein in her diet and continue to work on exercise and weight loss efforts. We will check fasting lipid panel today and she will follow up with cardiology next month.  Cardiovascular risk counseling Melanie [name] was given extended (15 minutes) coronary artery disease prevention counseling today. She is 43 y.o. female and has risk factors for heart disease including obesity, prediabetes and hyperlipidemia. We discussed intensive lifestyle modifications today with an emphasis on specific weight loss instructions and strategies. Pt was also informed of the importance of increasing exercise and decreasing saturated fats to help prevent heart disease.  Depression with Emotional Eating Behaviors We discussed behavior modification techniques today to help Melanie [name] deal with her emotional eating and depression. She will continue to take Wellbutrin SR 150 mg two times daily and follow up as directed.   Obesity Melanie [name] is currently in the action stage of change. As such, her goal is to continue with weight loss efforts She has agreed to portion control better and make smarter food choices, such as increase vegetables and decrease simple carbohydrates  We discussed the following Behavioral Modification Strategies today: increasing lean protein intake, decreasing simple carbohydrates and celebration eating strategies   Melanie [name] has agreed to follow up with our clinic in 3 weeks. She was informed of the importance of frequent follow up visits to maximize her success with intensive lifestyle modifications for her multiple health conditions.  ALLERGIES: Allergies  Allergen Reactions  . Welchol [Colesevelam] Shortness Of Breath  . Lidocaine Other (See Comments)    Flu like sx's  . Relafen [Nabumetone] Other (See Comments)    migraine  . Celebrex [Celecoxib] Rash  . Sulfa Antibiotics Rash    MEDICATIONS: Current Outpatient Medications on File Prior to Visit  Medication Sig Dispense Refill  . Alirocumab (PRALUENT) 150 MG/ML SOAJ Inject 1 Dose into the skin every 14 (fourteen) days. 2 pen 11  . buPROPion (WELLBUTRIN SR) 150 MG 12 hr tablet Take 1 tablet (150 mg total) by mouth 2 (two) times daily. 180 tablet 0  . busPIRone (BUSPAR) 15 MG tablet TAKE ONE TABLET BY MOUTH TWICE A DAY OR AS DIRECTED. (Patient taking differently: Take  15 mg by mouth 2 (two) times daily. TAKE ONE TABLET BY MOUTH TWICE A DAY OR AS DIRECTED.) 180 tablet 3  . cetirizine (ZYRTEC) 10 MG tablet Take 10 mg by mouth at bedtime.     . chlorthalidone (HYGROTON) 25 MG tablet Take 1 tablet (25 mg total) by mouth daily. 90 tablet 0  . citalopram (CELEXA) 40 MG tablet Take 1 tablet by mouth daily. 90 tablet 1  . fluticasone (FLONASE) 50 MCG/ACT nasal spray Place 2 sprays into both nostrils daily. (Patient taking differently: Place 2 sprays into both nostrils daily as needed for allergies. ) 48 g 3  . LORazepam (ATIVAN) 1 MG tablet Take  1 tablet (1 mg total) by mouth 2 (two) times daily as needed for anxiety. 20 tablet 0  . meclizine (ANTIVERT) 25 MG tablet TAKE ONE TABLET BY MOUTH THREE TIMES A DAY AS NEEDED FOR DIZZINESS (Patient taking differently: Take 25 mg by mouth 3 (three) times daily as needed for dizziness. ) 30 tablet 0  . meloxicam (MOBIC) 15 MG tablet Take 15 mg by mouth daily as needed for pain.     . metFORMIN (GLUCOPHAGE) 500 MG tablet Take 1 tablet (500 mg total) by mouth daily with breakfast. 90 tablet 0  . metoprolol tartrate (LOPRESSOR) 100 MG tablet Take ONE tablet TWO HOURS prior to test. 1 tablet 0  . montelukast (SINGULAIR) 10 MG tablet Take 1 tablet (10 mg total) by mouth at bedtime. 30 tablet 11  . Multiple Vitamin (MULTIVITAMIN) tablet Take 1 tablet by mouth daily.    . Omega-3 Fatty Acids (FISH OIL PO) Take 1 capsule by mouth 2 (two) times a day.     . SUMAtriptan (IMITREX) 50 MG tablet TAKE ONE TABLET BY MOUTH AT ONSET OF HEADACHE; MAY REPEAT ONE TABLET IN 2 HOURS IF NEEDED. (Patient taking differently: Take 50 mg by mouth See admin instructions. Take 1 tablet at onset of headache; may repeat one tablet in 2 hours if needed.) 10 tablet 0  . Ubrogepant (UBRELVY) 50 MG TABS Take 50 mg by mouth as needed (may repeat once after 2 hours). 20 tablet 3   No current facility-administered medications on file prior to visit.     PAST MEDICAL HISTORY: Past Medical History:  Diagnosis Date  . Allergy   . Anemia   . Anxiety   . Back pain   . Constipation   . Depression   . Fatty liver   . GERD (gastroesophageal reflux disease)   . Hyperlipidemia   . Infertility, female   . Joint pain   . Kidney problem   . 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 Migraines    LIGHT SENSITIVITY , NOW ON IMITREX SINCE 08-02-2018,NOTICES IMPROVEMENT   . Multiple food allergies   . Muscle pain   . Numbness and tingling in both hands   . Pre-diabetes   . Shortness of breath    was due to a medication  side effect, now off medication , SOB resolved   . Sleep apnea   . Stress   . Swelling of both lower extremities     PAST SURGICAL HISTORY: Past Surgical History:  Procedure Laterality Date  . ELBOW LIGAMENT RECONSTRUCTION Right 08/21/2018   Procedure: LATERAL COLLATERAL LIGAMENT REPAIR;  Surgeon: Hiram Gash, MD;  Location: [facility];  Service: Orthopedics;  Laterality: Right;  . FINGER SURGERY Left     index finger  . KNEE SURGERY Left   .  LIVER BIOPSY N/A 05/15/2014   Procedure: LIVER BIOPSY;  Surgeon: Inda Castle, MD;  Location: [facility];  Service: Endoscopy;  Laterality: N/A;  ultrasound to mark the liver  . TENNIS ELBOW RELEASE/NIRSCHEL PROCEDURE Right 08/21/2018   Procedure: TENNIS ELBOW RELEASE/NIRSCHEL PROCEDURE;  Surgeon: Hiram Gash, MD;  Location: [facility];  Service: Orthopedics;  Laterality: Right;  . TONSILLECTOMY AND ADENOIDECTOMY  1978  . TYMPANOSTOMY TUBE PLACEMENT    . ULNAR NERVE TRANSPOSITION Right 08/21/2018   Procedure: ULNAR NERVE DECOMPRESSION/TRANSPOSITION;  Surgeon: Hiram Gash, MD;  Location: [facility];  Service: Orthopedics;  Laterality: Right;  . wrist cyst Left     SOCIAL HISTORY: Social History   Tobacco Use  . Smoking status: Never Smoker  . Smokeless tobacco: Never Used  Substance Use Topics  . Alcohol use: No    Alcohol/week: 0.0 standard drinks  . Drug use: No    FAMILY HISTORY: Family History  Problem Relation Age of Onset  . Diabetes Mother   . Liver disease Mother        ESLD- unknown etiology  . Obesity Mother   . Diabetes Maternal Grandmother   . Diabetes Paternal Grandmother   . Breast cancer Paternal Grandmother   . Stroke Maternal Grandfather   . Hyperlipidemia Father     ROS: Review of Systems  Constitutional: Negative for weight loss.  Respiratory: Negative for shortness of breath.   Cardiovascular: Negative for chest pain and palpitations.  Gastrointestinal: Negative for nausea and vomiting.  Musculoskeletal:        Negative for muscle weakness  Endo/Heme/Allergies:       Positive for heat or cold intolerance Negative for polyphagia  Psychiatric/Behavioral: Positive for depression. Negative for suicidal ideas.    PHYSICAL EXAM: Blood pressure 107/67, pulse 60, temperature 98.4 F (36.9 C), temperature source Oral, height 5' 8"  (1.727 m), weight (!) 325 lb (147.4 kg), SpO2 95 %. Body mass index is 49.42 kg/m. Physical Exam Vitals signs reviewed.  Constitutional:      Appearance: Normal appearance. She is well-developed. She is obese.  Cardiovascular:     Rate and Rhythm: Normal rate.  Pulmonary:     Effort: Pulmonary effort is normal.  Musculoskeletal: Normal range of motion.  Skin:    General: Skin is warm and dry.  Neurological:     Mental Status: She is alert and oriented to person, place, and time.  Psychiatric:        Mood and Affect: Mood normal.        Behavior: Behavior normal.        Thought Content: Thought content does not include homicidal or suicidal ideation.     RECENT LABS AND TESTS: BMET    Component Value Date/Time   NA 139 12/24/2018 0818   K 3.6 12/24/2018 0818   CL 100 12/24/2018 0818   CO2 27 12/24/2018 0818   GLUCOSE 134 (H) 12/24/2018 0818   GLUCOSE 127 (H) 08/16/2018 1048   BUN 20 12/24/2018 0818   CREATININE 1.10 (H) 12/24/2018 0818   CREATININE 0.85 09/08/2015 1514   CALCIUM 10.4 (H) 12/24/2018 0818   GFRNONAA 61 12/24/2018 0818   GFRNONAA 85 09/08/2015 1514   GFRAA 71 12/24/2018 0818   GFRAA >89 09/08/2015 1514   Lab Results  Component Value Date   HGBA1C 5.6 12/24/2018   HGBA1C 5.5 07/25/2018   HGBA1C 5.5 05/01/2018   HGBA1C 5.6 12/13/2017   HGBA1C 5.0 09/05/2017   Lab Results  Component Value Date  INSULIN 46.3 (H) 12/24/2018   INSULIN 39.2 (H) 07/25/2018   INSULIN 30.4 (H) 05/01/2018   INSULIN 39.0 (H) 12/13/2017   INSULIN 24.3 09/05/2017   CBC    Component Value Date/Time   WBC 6.2 08/16/2018 1048   RBC 4.46 08/16/2018 1048    HGB 14.4 08/16/2018 1048   HGB 14.6 07/26/2018 1549   HCT 41.2 08/16/2018 1048   HCT 41.6 07/26/2018 1549   PLT 229 08/16/2018 1048   PLT 254 07/26/2018 1549   MCV 92.4 08/16/2018 1048   MCV 90 07/26/2018 1549   MCH 32.3 08/16/2018 1048   MCHC 35.0 08/16/2018 1048   RDW 12.2 08/16/2018 1048   RDW 12.8 07/26/2018 1549   LYMPHSABS 2.8 05/23/2017 1249   MONOABS 0.4 10/27/2013 1604   EOSABS 0.3 05/23/2017 1249   BASOSABS 0.0 05/23/2017 1249   Iron/TIBC/Ferritin/ %Sat    Component Value Date/Time   IRON 95 06/27/2013 1645   TIBC 371 06/27/2013 1645   FERRITIN 401 (H) 06/27/2013 1645   IRONPCTSAT 26 06/27/2013 1645   Lipid Panel     Component Value Date/Time   CHOL 227 (H) 12/24/2018 0818   TRIG 180 (H) 12/24/2018 0818   HDL 60 12/24/2018 0818   CHOLHDL 7.0 (H) 07/26/2018 1549   CHOLHDL 5.0 06/06/2013 1654   VLDL 27 06/06/2013 1654   LDLCALC 135 (H) 12/24/2018 0818   Hepatic Function Panel     Component Value Date/Time   PROT 6.2 12/24/2018 0818   ALBUMIN 4.1 12/24/2018 0818   AST 41 (H) 12/24/2018 0818   ALT 55 (H) 12/24/2018 0818   ALKPHOS 157 (H) 12/24/2018 0818   BILITOT 0.9 12/24/2018 0818   BILIDIR 0.2 04/07/2015 1349   IBILI 1.0 04/07/2015 1349      Component Value Date/Time   TSH 3.950 12/24/2018 0818   TSH 6.170 (H) 06/27/2018 1139   TSH 5.010 (H) 05/01/2018 1031     Ref. Range 07/25/2018 11:00  Vitamin D, 25-Hydroxy Latest Ref Range: 30.0 - 100.0 ng/mL 37.0    OBESITY BEHAVIORAL INTERVENTION VISIT  Today's visit was # 30  Starting weight: 338 lbs Starting date: 05/23/2017 Today's weight : 325 lbs Today's date: 12/24/2018 Total lbs lost to date: 13    12/24/2018  Height 5' 8"  (1.727 m)  Weight 325 lb (147.4 kg) (A)  BMI (Calculated) 49.43  BLOOD PRESSURE - SYSTOLIC 179  BLOOD PRESSURE - DIASTOLIC 67   Body Fat % 15.0 %  Total Body Water (lbs) 110.8 lbs    ASK: We discussed the diagnosis of obesity with [name] today and Maddalyn  agreed to give [place] permission to discuss obesity behavioral modification therapy today.  ASSESS: Cerria has the diagnosis of obesity and her BMI today is 49.43 Shaqueena is in the action stage of change   ADVISE: Ayshia was educated on the multiple health risks of obesity as well as the benefit of weight loss to improve her health. She was advised of the need for long term treatment and the importance of lifestyle modifications to improve her current health and to decrease her risk of future health problems.  AGREE: Multiple dietary modification options and treatment options were discussed and  Moet agreed to follow the recommendations documented in the above note.  ARRANGE: Luz was educated on the importance of frequent visits to treat obesity as outlined per CMS and USPSTF guidelines and agreed to schedule her next follow up appointment today.  Corey Skains, am acting as transcriptionist  for [employer], FNP-C.  I have reviewed the above documentation for accuracy and completeness, and I agree with the above.  - Marely Apgar, FNP-C.

## 2018-12-26 ENCOUNTER — Encounter (INDEPENDENT_AMBULATORY_CARE_PROVIDER_SITE_OTHER): Payer: Self-pay | Admitting: Family Medicine

## 2018-12-26 DIAGNOSIS — E559 Vitamin D deficiency, unspecified: Secondary | ICD-10-CM | POA: Insufficient documentation

## 2018-12-26 DIAGNOSIS — Z6841 Body Mass Index (BMI) 40.0 and over, adult: Secondary | ICD-10-CM | POA: Insufficient documentation

## 2018-12-31 ENCOUNTER — Telehealth: Payer: Self-pay

## 2018-12-31 NOTE — Telephone Encounter (Signed)
Received PA request for ubrelvy. Completed PA for ubrelvy via covermymeds. KeyCarmelina [name] and sent to [facility]. Should have a determination in 24 hours.

## 2018-12-31 NOTE — Telephone Encounter (Signed)
PA for Melanie [name] was again denied by [facility]. Pt has already been given a copay card and instructed on how to use it.

## 2019-01-13 ENCOUNTER — Encounter (INDEPENDENT_AMBULATORY_CARE_PROVIDER_SITE_OTHER): Payer: Self-pay

## 2019-01-14 ENCOUNTER — Ambulatory Visit (INDEPENDENT_AMBULATORY_CARE_PROVIDER_SITE_OTHER): Payer: BC Managed Care – PPO | Admitting: Physician Assistant

## 2019-01-14 ENCOUNTER — Encounter (INDEPENDENT_AMBULATORY_CARE_PROVIDER_SITE_OTHER): Payer: Self-pay | Admitting: Physician Assistant

## 2019-01-14 ENCOUNTER — Other Ambulatory Visit: Payer: Self-pay

## 2019-01-14 VITALS — BP 125/80 | HR 63 | Temp 98.0°F | Ht 68.0 in | Wt 332.0 lb

## 2019-01-14 DIAGNOSIS — E559 Vitamin D deficiency, unspecified: Secondary | ICD-10-CM

## 2019-01-14 DIAGNOSIS — Z9189 Other specified personal risk factors, not elsewhere classified: Secondary | ICD-10-CM | POA: Diagnosis not present

## 2019-01-14 DIAGNOSIS — Z6841 Body Mass Index (BMI) 40.0 and over, adult: Secondary | ICD-10-CM

## 2019-01-14 DIAGNOSIS — R7303 Prediabetes: Secondary | ICD-10-CM

## 2019-01-14 DIAGNOSIS — K219 Gastro-esophageal reflux disease without esophagitis: Secondary | ICD-10-CM

## 2019-01-14 MED ORDER — OMEPRAZOLE 20 MG PO CPDR: 20.0000 mg | DELAYED_RELEASE_CAPSULE | Freq: Every day | ORAL | 0 refills | Status: DC

## 2019-01-14 MED ORDER — METFORMIN HCL 500 MG PO TABS: 500.0000 mg | ORAL_TABLET | Freq: Two times a day (BID) | ORAL | 0 refills | Status: DC

## 2019-01-14 MED ORDER — VITAMIN D (ERGOCALCIFEROL) 1.25 MG (50000 UNIT) PO CAPS: 50000.0000 [IU] | ORAL_CAPSULE | ORAL | 0 refills | Status: DC

## 2019-01-14 NOTE — Progress Notes (Signed)
Office: [phone]  /  Fax: [phone]   HPI:   Chief Complaint: OBESITY Melanie [name] is here to discuss her progress with her obesity treatment plan. She is following the portion control and making smarter food choices plan, such as increase vegetables and decrease simple carbohydrates  and is following her eating plan approximately 85% of the time. She states she is doing more walking than normal.  [name] reports being under a lot of stress recently and, therefore, has not been making good choices. She has had some lower extremity edema recently along with some reflux. Her weight is (!) 332 lb (150.6 kg) today and has had a weight gain of 7 lbs since her last visit. She has lost 6 lbs since starting treatment with [place].  Vitamin D deficiency Melanie [name] has a diagnosis of Vitamin D deficiency. She is currently taking prescription Vit D and denies nausea, vomiting or muscle weakness.  At risk for osteopenia and osteoporosis Melanie [name] is at higher risk of osteopenia and osteoporosis due to Vitamin D deficiency.   Gastroesophageal Reflux Disease Melanie [name] reports heartburn and intermittent nausea with no diarrhea, no blood in stool or black stools. No abdominal pain. She recently had a heart CT with Cardiology due to dizziness that was normal.   Pre-Diabetes Melanie [name] has a diagnosis of prediabetes based on her elevated Hgb A1c and was informed this puts her at greater risk of developing diabetes. She is taking metformin currently and continues to work on diet and exercise to decrease risk of diabetes. She denies nausea, vomiting, diarrhea, or hypoglycemia. No polyphagia.  ASSESSMENT AND PLAN:  Vitamin D deficiency - Plan: Vitamin D, Ergocalciferol, (DRISDOL) 1.25 MG (50000 UT) CAPS capsule  Gastroesophageal reflux disease, unspecified whether esophagitis present - Plan: omeprazole (PRILOSEC) 20 MG capsule  Prediabetes - Plan: metFORMIN (GLUCOPHAGE) 500 MG tablet  At risk for osteoporosis  Class 3 severe  obesity with serious comorbidity and body mass index (BMI) of 50.0 to 59.9 in adult, unspecified obesity type ([place])  PLAN:  Vitamin D Deficiency Melanie [name] was informed that low Vitamin D levels contributes to fatigue and are associated with obesity, breast, and colon cancer. She agrees to continue to take prescription Vit D @ 50,000 IU 2 times a week #10 with 0 refills and will follow-up for routine testing of Vitamin D, at least 2-3 times per year. She was informed of the risk of over-replacement of Vitamin D and agrees to not increase her dose unless she discusses this with [place] first. Melanie [name] agrees to follow-up with our clinic in 2 weeks.  At risk for osteopenia and osteoporosis Melanie [name] was given extended  (15 minutes) osteoporosis prevention counseling today. Melanie [name] is at risk for osteopenia and osteoporosis due to her Vitamin D deficiency. She was encouraged to take her Vitamin D and follow her higher calcium diet and increase strengthening exercise to help strengthen her bones and decrease her risk of osteopenia and osteoporosis.  Gastroesophageal Reflux Disease Melanie [name] will start omeprazole 20 mg 1 PO QAM 30 minutes prior to breakfast. She agrees to follow-up with our clinic in 2 weeks.  Pre-Diabetes Melanie [name] will continue to work on weight loss, exercise, and decreasing simple carbohydrates in her diet to help decrease the risk of diabetes. We dicussed metformin including benefits and risks. She was informed that eating too many simple carbohydrates or too many calories at one sitting increases the likelihood of GI side effects. Melanie [name] will change her metformin dose to BID #180 with 0 refills. She agrees to  follow-up with our clinic in 2 weeks.  Obesity Melanie [name] is currently in the action stage of change. As such, her goal is to continue with weight loss efforts. She has agreed to change and will now follow the Category 3 plan. Melanie [name] has been instructed to work up to a goal of 150 minutes of combined cardio and  strengthening exercise per week for weight loss and overall health benefits. We discussed the following Behavioral Modification Strategies today: work on meal planning and easy cooking plans and keeping healthy foods in the home.  Melanie [name] has agreed to follow-up with our clinic in 2 weeks. She was informed of the importance of frequent follow-up visits to maximize her success with intensive lifestyle modifications for her multiple health conditions.  ALLERGIES: Allergies  Allergen Reactions   Welchol [Colesevelam] Shortness Of Breath   Lidocaine Other (See Comments)    Flu like sx's   Relafen [Nabumetone] Other (See Comments)    migraine   Celebrex [Celecoxib] Rash   Sulfa Antibiotics Rash    MEDICATIONS: Current Outpatient Medications on File Prior to Visit  Medication Sig Dispense Refill   Alirocumab (PRALUENT) 150 MG/ML SOAJ Inject 1 Dose into the skin every 14 (fourteen) days. 2 pen 11   buPROPion (WELLBUTRIN SR) 150 MG 12 hr tablet Take 1 tablet (150 mg total) by mouth 2 (two) times daily. 180 tablet 0   busPIRone (BUSPAR) 15 MG tablet TAKE ONE TABLET BY MOUTH TWICE A DAY OR AS DIRECTED. (Patient taking differently: Take 15 mg by mouth 2 (two) times daily. TAKE ONE TABLET BY MOUTH TWICE A DAY OR AS DIRECTED.) 180 tablet 3   cetirizine (ZYRTEC) 10 MG tablet Take 10 mg by mouth at bedtime.      chlorthalidone (HYGROTON) 25 MG tablet Take 1 tablet (25 mg total) by mouth daily. 90 tablet 0   citalopram (CELEXA) 40 MG tablet Take 1 tablet by mouth daily. 90 tablet 1   fluticasone (FLONASE) 50 MCG/ACT nasal spray Place 2 sprays into both nostrils daily. (Patient taking differently: Place 2 sprays into both nostrils daily as needed for allergies. ) 48 g 3   levothyroxine (SYNTHROID) 88 MCG tablet TAKE ONE TABLET BY MOUTH DAILY 30 tablet 0   LORazepam (ATIVAN) 1 MG tablet Take 1 tablet (1 mg total) by mouth 2 (two) times daily as needed for anxiety. 20 tablet 0   meclizine  (ANTIVERT) 25 MG tablet TAKE ONE TABLET BY MOUTH THREE TIMES A DAY AS NEEDED FOR DIZZINESS (Patient taking differently: Take 25 mg by mouth 3 (three) times daily as needed for dizziness. ) 30 tablet 0   meloxicam (MOBIC) 15 MG tablet Take 15 mg by mouth daily as needed for pain.      metoprolol tartrate (LOPRESSOR) 100 MG tablet Take ONE tablet TWO HOURS prior to test. 1 tablet 0   montelukast (SINGULAIR) 10 MG tablet Take 1 tablet (10 mg total) by mouth at bedtime. 30 tablet 11   Multiple Vitamin (MULTIVITAMIN) tablet Take 1 tablet by mouth daily.     Omega-3 Fatty Acids (FISH OIL PO) Take 1 capsule by mouth 2 (two) times a day.      SUMAtriptan (IMITREX) 50 MG tablet TAKE ONE TABLET BY MOUTH AT ONSET OF HEADACHE; MAY REPEAT ONE TABLET IN 2 HOURS IF NEEDED. (Patient taking differently: Take 50 mg by mouth See admin instructions. Take 1 tablet at onset of headache; may repeat one tablet in 2 hours if needed.) 10 tablet 0  Ubrogepant (UBRELVY) 50 MG TABS Take 50 mg by mouth as needed (may repeat once after 2 hours). 20 tablet 3   No current facility-administered medications on file prior to visit.     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    Migraines    LIGHT SENSITIVITY , NOW ON IMITREX SINCE 08-02-2018,NOTICES IMPROVEMENT    Multiple food allergies    Muscle pain    Numbness and tingling in both hands    Pre-diabetes    Shortness of breath    was due to a medication side effect, now off medication , SOB resolved    Sleep apnea    Stress    Swelling of both lower extremities     PAST SURGICAL HISTORY: Past Surgical History:  Procedure Laterality Date   ELBOW  LIGAMENT RECONSTRUCTION Right 08/21/2018   Procedure: LATERAL COLLATERAL LIGAMENT REPAIR;  Surgeon: Hiram Gash, MD;  Location: [facility];  Service: Orthopedics;  Laterality: Right;   FINGER SURGERY Left     index finger   KNEE SURGERY Left    LIVER BIOPSY N/A 05/15/2014   Procedure: LIVER BIOPSY;  Surgeon: Inda Castle, MD;  Location: [facility];  Service: Endoscopy;  Laterality: N/A;  ultrasound to mark the liver   TENNIS ELBOW RELEASE/NIRSCHEL PROCEDURE Right 08/21/2018   Procedure: TENNIS ELBOW RELEASE/NIRSCHEL PROCEDURE;  Surgeon: Hiram Gash, MD;  Location: [facility];  Service: Orthopedics;  Laterality: Right;   TONSILLECTOMY AND ADENOIDECTOMY  1978   TYMPANOSTOMY TUBE PLACEMENT     ULNAR NERVE TRANSPOSITION Right 08/21/2018   Procedure: ULNAR NERVE DECOMPRESSION/TRANSPOSITION;  Surgeon: Hiram Gash, MD;  Location: [facility];  Service: Orthopedics;  Laterality: Right;   wrist cyst Left     SOCIAL HISTORY: Social History   Tobacco Use   Smoking status: Never Smoker   Smokeless tobacco: Never Used  Substance Use Topics   Alcohol use: No    Alcohol/week: 0.0 standard drinks   Drug use: No    FAMILY HISTORY: Family History  Problem Relation Age of Onset   Diabetes Mother    Liver disease Mother        ESLD- unknown etiology   Obesity Mother    Diabetes Maternal Grandmother    Diabetes Paternal Grandmother    Breast cancer Paternal Grandmother    Stroke Maternal Grandfather    Hyperlipidemia Father    ROS: Review of Systems  Gastrointestinal: Negative for diarrhea, nausea and vomiting.       Positive for GERD.  Musculoskeletal:       Negative for muscle weakness.  Endo/Heme/Allergies:       Negative for polyphagia.   PHYSICAL EXAM: Blood pressure 125/80, pulse 63, temperature 98 F (36.7 C), temperature source Oral, height 5' 8"  (1.727 m), weight (!) 332 lb (150.6 kg), SpO2 96 %. Body mass index is 50.48 kg/m. Physical Exam Vitals signs  reviewed.  Constitutional:      Appearance: Normal appearance. She is obese.  Cardiovascular:     Rate and Rhythm: Normal rate.     Pulses: Normal pulses.  Pulmonary:     Effort: Pulmonary effort is normal.     Breath sounds: Normal breath sounds.  Musculoskeletal: Normal range of motion.  Skin:    General: Skin is warm and dry.  Neurological:     Mental Status: She is alert and oriented to person, place, and time.  Psychiatric:        Behavior: Behavior normal.   RECENT LABS AND TESTS: BMET    Component Value Date/Time   NA 139 12/24/2018 0818   K 3.6 12/24/2018 0818   CL 100 12/24/2018 0818   CO2 27 12/24/2018 0818   GLUCOSE 134 (H) 12/24/2018 0818   GLUCOSE 127 (H) 08/16/2018 1048   BUN 20 12/24/2018 0818   CREATININE 1.10 (H) 12/24/2018 0818   CREATININE 0.85 09/08/2015 1514   CALCIUM 10.4 (H) 12/24/2018 0818   GFRNONAA 61 12/24/2018 0818   GFRNONAA 85 09/08/2015 1514   GFRAA 71 12/24/2018 0818   GFRAA >89 09/08/2015 1514   Lab Results  Component Value Date   HGBA1C 5.6 12/24/2018   HGBA1C 5.5 07/25/2018   HGBA1C 5.5 05/01/2018   HGBA1C 5.6 12/13/2017   HGBA1C 5.0 09/05/2017   Lab Results  Component Value Date   INSULIN 46.3 (H) 12/24/2018   INSULIN 39.2 (H) 07/25/2018   INSULIN 30.4 (H) 05/01/2018   INSULIN 39.0 (H) 12/13/2017   INSULIN 24.3 09/05/2017   CBC    Component Value Date/Time   WBC 6.2 08/16/2018 1048   RBC 4.46 08/16/2018 1048   HGB 14.4 08/16/2018 1048   HGB 14.6 07/26/2018 1549   HCT 41.2 08/16/2018 1048   HCT 41.6 07/26/2018 1549   PLT 229 08/16/2018 1048   PLT 254 07/26/2018 1549   MCV 92.4 08/16/2018 1048   MCV 90 07/26/2018 1549   MCH 32.3 08/16/2018 1048   MCHC 35.0 08/16/2018 1048   RDW 12.2 08/16/2018 1048   RDW 12.8 07/26/2018 1549   LYMPHSABS 2.8 05/23/2017 1249   MONOABS 0.4 10/27/2013 1604   EOSABS 0.3 05/23/2017 1249   BASOSABS 0.0 05/23/2017 1249   Iron/TIBC/Ferritin/ %Sat    Component Value Date/Time   IRON  95 06/27/2013 1645   TIBC 371 06/27/2013 1645   FERRITIN 401 (H) 06/27/2013 1645   IRONPCTSAT 26 06/27/2013 1645   Lipid Panel     Component Value Date/Time   CHOL 227 (H) 12/24/2018 0818   TRIG 180 (H) 12/24/2018 0818   HDL 60 12/24/2018 0818   CHOLHDL 7.0 (H) 07/26/2018 1549   CHOLHDL 5.0 06/06/2013 1654   VLDL 27 06/06/2013 1654   LDLCALC 135 (H) 12/24/2018 0818   Hepatic Function Panel     Component Value Date/Time   PROT 6.2 12/24/2018 0818   ALBUMIN 4.1 12/24/2018 0818   AST 41 (H) 12/24/2018 0818   ALT 55 (H) 12/24/2018 0818   ALKPHOS 157 (H) 12/24/2018 0818   BILITOT 0.9 12/24/2018 0818   BILIDIR 0.2 04/07/2015 1349   IBILI 1.0 04/07/2015 1349      Component Value Date/Time   TSH 3.950 12/24/2018 0818   TSH 6.170 (H) 06/27/2018 1139   TSH 5.010 (H) 05/01/2018 1031   Results for NALDA, SHACKLEFORD (MRN [id number]) as of 01/14/2019 10:00  Ref. Range 12/24/2018 08:18  Vitamin D, 25-Hydroxy Latest Ref Range: 30.0 - 100.0 ng/mL 44.8   OBESITY BEHAVIORAL INTERVENTION VISIT  Today's visit was #30  Starting weight: 338 lbs Starting date: 05/23/2017 Today's weight: 332 lbs  Today's date: 01/14/2019 Total lbs lost to date:  6    01/14/2019  Height 5' 8"  (1.727 m)  Weight 332 lb (150.6 kg) (A)  BMI (Calculated) 50.49  BLOOD PRESSURE - SYSTOLIC 101  BLOOD PRESSURE - DIASTOLIC 80   Body Fat % 75.1 %  Total Body Water (lbs) 114.4 lbs   ASK: We discussed the diagnosis of obesity with Melanie [name] today and Melanie [name] agreed to give [place] permission to discuss obesity behavioral modification therapy today.  ASSESS: Merlin has the diagnosis of obesity and her BMI today is 50.6. Melanie [name] is in the action stage of change.   ADVISE: Melanie [name] was educated on the multiple health risks of obesity as well as the benefit of weight loss to improve her health. She was advised of the need for long term treatment and the importance of lifestyle modifications to improve her current health and to  decrease her risk of future health problems.  AGREE: Multiple dietary modification options and treatment options were discussed and  Melanie [name] agreed to follow the recommendations documented in the above note.  ARRANGE: Melanie [name] was educated on the importance of frequent visits to treat obesity as outlined per CMS and USPSTF guidelines and agreed to schedule her next follow up appointment today.  Migdalia Dk, am acting as transcriptionist for Abby Potash, PA-C I, Abby Potash, PA-C have reviewed above note and agree with its content

## 2019-01-15 ENCOUNTER — Other Ambulatory Visit (INDEPENDENT_AMBULATORY_CARE_PROVIDER_SITE_OTHER): Payer: Self-pay

## 2019-01-15 ENCOUNTER — Other Ambulatory Visit: Payer: Self-pay | Admitting: Family Medicine

## 2019-01-15 ENCOUNTER — Ambulatory Visit: Payer: BC Managed Care – PPO | Admitting: Internal Medicine

## 2019-01-15 ENCOUNTER — Other Ambulatory Visit (INDEPENDENT_AMBULATORY_CARE_PROVIDER_SITE_OTHER): Payer: Self-pay | Admitting: Physician Assistant

## 2019-01-15 DIAGNOSIS — F3289 Other specified depressive episodes: Secondary | ICD-10-CM

## 2019-01-15 DIAGNOSIS — K219 Gastro-esophageal reflux disease without esophagitis: Secondary | ICD-10-CM

## 2019-01-15 DIAGNOSIS — I10 Essential (primary) hypertension: Secondary | ICD-10-CM

## 2019-01-15 DIAGNOSIS — E039 Hypothyroidism, unspecified: Secondary | ICD-10-CM

## 2019-01-15 MED ORDER — BUPROPION HCL ER (SR) 150 MG PO TB12: 150.0000 mg | ORAL_TABLET | Freq: Two times a day (BID) | ORAL | 0 refills | Status: DC

## 2019-01-15 MED ORDER — CHLORTHALIDONE 25 MG PO TABS: 25.0000 mg | ORAL_TABLET | Freq: Every day | ORAL | 0 refills | Status: DC

## 2019-01-15 NOTE — Telephone Encounter (Signed)
Forwarding medication refill request to the clinica; pool for review.

## 2019-01-15 NOTE — Telephone Encounter (Signed)
Copied from [place] [phone]. Topic: Quick Communication - Rx Refill/Question >> Jan 15, 2019  3:47 PM Leward Quan A wrote: Medication: levothyroxine (SYNTHROID) 88 MCG tablet, buPROPion (WELLBUTRIN SR) 150 MG 12 hr tablet   Ask for additional refill request   Has the patient contacted their pharmacy? Yes.   (Agent: If no, request that the patient contact the pharmacy for the refill.) (Agent: If yes, when and what did the pharmacy advise?)  Preferred Pharmacy (with phone number or street name): [facility], [place] [phone] (Phone) [phone] (Fax)    Agent: Please be advised that RX refills may take up to 3 business days. We ask that you follow-up with your pharmacy.

## 2019-01-16 ENCOUNTER — Other Ambulatory Visit: Payer: Self-pay

## 2019-01-16 ENCOUNTER — Telehealth (INDEPENDENT_AMBULATORY_CARE_PROVIDER_SITE_OTHER): Payer: BC Managed Care – PPO | Admitting: Family Medicine

## 2019-01-16 ENCOUNTER — Other Ambulatory Visit (INDEPENDENT_AMBULATORY_CARE_PROVIDER_SITE_OTHER): Payer: Self-pay | Admitting: Physician Assistant

## 2019-01-16 ENCOUNTER — Encounter (HOSPITAL_COMMUNITY): Payer: Self-pay

## 2019-01-16 ENCOUNTER — Emergency Department (HOSPITAL_COMMUNITY)
Admission: EM | Admit: 2019-01-16 | Discharge: 2019-01-16 | Disposition: A | Discharge status: Home / Self Care | Payer: BC Managed Care – PPO | Attending: Emergency Medicine | Admitting: Emergency Medicine

## 2019-01-16 ENCOUNTER — Emergency Department (HOSPITAL_COMMUNITY): Payer: BC Managed Care – PPO

## 2019-01-16 ENCOUNTER — Telehealth: Payer: Self-pay

## 2019-01-16 DIAGNOSIS — Z79899 Other long term (current) drug therapy: Secondary | ICD-10-CM | POA: Insufficient documentation

## 2019-01-16 DIAGNOSIS — R110 Nausea: Secondary | ICD-10-CM

## 2019-01-16 DIAGNOSIS — R42 Dizziness and giddiness: Secondary | ICD-10-CM | POA: Diagnosis not present

## 2019-01-16 DIAGNOSIS — N390 Urinary tract infection, site not specified: Secondary | ICD-10-CM | POA: Diagnosis not present

## 2019-01-16 DIAGNOSIS — R197 Diarrhea, unspecified: Secondary | ICD-10-CM

## 2019-01-16 DIAGNOSIS — R52 Pain, unspecified: Secondary | ICD-10-CM | POA: Diagnosis not present

## 2019-01-16 DIAGNOSIS — R519 Headache, unspecified: Secondary | ICD-10-CM

## 2019-01-16 DIAGNOSIS — R319 Hematuria, unspecified: Secondary | ICD-10-CM | POA: Diagnosis present

## 2019-01-16 DIAGNOSIS — E559 Vitamin D deficiency, unspecified: Secondary | ICD-10-CM

## 2019-01-16 DIAGNOSIS — R109 Unspecified abdominal pain: Secondary | ICD-10-CM

## 2019-01-16 DIAGNOSIS — M549 Dorsalgia, unspecified: Secondary | ICD-10-CM

## 2019-01-16 LAB — COMPREHENSIVE METABOLIC PANEL
ALT: 73 U/L — ABNORMAL HIGH (ref 0–44)
AST: 62 U/L — ABNORMAL HIGH (ref 15–41)
Albumin: 4.4 g/dL (ref 3.5–5.0)
Alkaline Phosphatase: 138 U/L — ABNORMAL HIGH (ref 38–126)
Anion gap: 9 (ref 5–15)
BUN: 22 mg/dL — ABNORMAL HIGH (ref 6–20)
CO2: 30 mmol/L (ref 22–32)
Calcium: 10.4 mg/dL — ABNORMAL HIGH (ref 8.9–10.3)
Chloride: 99 mmol/L (ref 98–111)
Creatinine, Ser: 1.16 mg/dL — ABNORMAL HIGH (ref 0.44–1.00)
GFR calc Af Amer: >60 mL/min (ref >60)
GFR calc non Af Amer: 57 mL/min — ABNORMAL LOW (ref >60)
Glucose, Bld: 110 mg/dL — ABNORMAL HIGH (ref 70–99)
Potassium: 3.6 mmol/L (ref 3.5–5.1)
Sodium: 138 mmol/L (ref 135–145)
Total Bilirubin: 1.7 mg/dL — ABNORMAL HIGH (ref 0.3–1.2)
Total Protein: 7.2 g/dL (ref 6.5–8.1)

## 2019-01-16 LAB — CBC
HCT: 41.1 % (ref 36.0–46.0)
Hemoglobin: 13.9 g/dL (ref 12.0–15.0)
MCH: 32 pg (ref 26.0–34.0)
MCHC: 33.8 g/dL (ref 30.0–36.0)
MCV: 94.5 fL (ref 80.0–100.0)
Platelets: 221 10*3/uL (ref 150–400)
RBC: 4.35 MIL/uL (ref 3.87–5.11)
RDW: 12.3 % (ref 11.5–15.5)
WBC: 7.7 10*3/uL (ref 4.0–10.5)
nRBC: 0 % (ref 0.0–0.2)

## 2019-01-16 LAB — URINALYSIS, ROUTINE W REFLEX MICROSCOPIC
Bacteria, UA: NONE SEEN
Bilirubin Urine: NEGATIVE
Glucose, UA: NEGATIVE mg/dL
Ketones, ur: NEGATIVE mg/dL
Nitrite: POSITIVE — AB
Protein, ur: 100 mg/dL — AB
Specific Gravity, Urine: 1.016 (ref 1.005–1.030)
WBC, UA: >50 WBC/hpf — ABNORMAL HIGH (ref 0–5)
pH: 6 (ref 5.0–8.0)

## 2019-01-16 LAB — LIPASE, BLOOD: Lipase: 32 U/L (ref 11–51)

## 2019-01-16 LAB — I-STAT BETA HCG BLOOD, ED (MC, WL, AP ONLY): I-stat hCG, quantitative: <5 m[IU]/mL (ref <5)

## 2019-01-16 LAB — POCT URINALYSIS DIP (MANUAL ENTRY)
Bilirubin, UA: NEGATIVE
Glucose, UA: NEGATIVE mg/dL
Ketones, POC UA: NEGATIVE mg/dL
Nitrite, UA: POSITIVE — AB
Protein Ur, POC: 100 mg/dL — AB
Spec Grav, UA: 1.02 (ref 1.010–1.025)
Urobilinogen, UA: 1 E.U./dL
pH, UA: 7 (ref 5.0–8.0)

## 2019-01-16 MED ORDER — CEPHALEXIN 500 MG PO CAPS
500.0000 mg | ORAL_CAPSULE | Freq: Once | ORAL | Status: AC
  Administered 2019-01-16: 500 mg via ORAL
  Filled 2019-01-16: qty 1

## 2019-01-16 MED ORDER — CEPHALEXIN 500 MG PO CAPS: 500.0000 mg | ORAL_CAPSULE | Freq: Four times a day (QID) | ORAL | 0 refills | Status: AC

## 2019-01-16 MED ORDER — SODIUM CHLORIDE 0.9% FLUSH: 3.0000 mL | Freq: Once | INTRAVENOUS | Status: DC

## 2019-01-16 MED ORDER — IOHEXOL 300 MG/ML  SOLN
100.0000 mL | Freq: Once | INTRAMUSCULAR | Status: AC | PRN
  Administered 2019-01-16: 21:00:00 100 mL via INTRAVENOUS

## 2019-01-16 MED ORDER — SODIUM CHLORIDE (PF) 0.9 % IJ SOLN
INTRAMUSCULAR | Status: AC
  Filled 2019-01-16: qty 50

## 2019-01-16 MED ORDER — ONDANSETRON HCL 4 MG PO TABS: 4.0000 mg | ORAL_TABLET | Freq: Three times a day (TID) | ORAL | 0 refills | Status: DC | PRN

## 2019-01-16 NOTE — Progress Notes (Signed)
CC- blood in urine- Patient was flagged for covid and was switched to a tele-med. Patient is in the car. Patient has bee having diarrhea. Dizzy, weak, fatigue, nausea and diarrhea that has been going on since yday afternoon.

## 2019-01-16 NOTE — ED Provider Notes (Signed)
[facility] DEPT Provider Note   CSN: [id number] Arrival date & time: 01/16/19  1632     History   Chief Complaint Chief Complaint  Patient presents with  . Hematuria  . Abdominal Pain    HPI Melanie [name] is a 43 y.o. female.  Presents to ER with chief complaint hematuria, abdominal pain.  Patient states she noticed a pink tinge to her urine couple days ago.  Then developed some generalized nausea and weakness.  No fevers at home.  Went to her primary care doctor and they recommended coming to ER for further eval.     HPI  Past Medical History:  Diagnosis Date  . Allergy   . Anemia   . Anxiety   . Back pain   . Constipation   . Depression   . Fatty liver   . GERD (gastroesophageal reflux disease)   . Hyperlipidemia   . Infertility, female   . Joint pain   . Kidney problem   . 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 Migraines    LIGHT SENSITIVITY , NOW ON IMITREX SINCE 08-02-2018,NOTICES IMPROVEMENT   . Multiple food allergies   . Muscle pain   . Numbness and tingling in both hands   . Pre-diabetes   . Shortness of breath    was due to a medication side effect, now off medication , SOB resolved   . Sleep apnea   . Stress   . Swelling of both lower extremities     Patient Active Problem List   Diagnosis Date Noted  . Vitamin D deficiency 12/26/2018  . Class 3 severe obesity with serious comorbidity and body mass index (BMI) of 45.0 to 49.9 in adult ([place]) 12/26/2018  . Right tennis elbow 07/30/2018  . Cubital tunnel syndrome 07/30/2018  . Chronic pain of both feet 01/23/2018  . Bilateral calcaneal spurs 01/23/2018  . Other fatigue 05/23/2017  . Shortness of breath on exertion 05/23/2017  . Prediabetes 05/23/2017  . Liver fibrosis 05/23/2017  . Hyperlipidemia 04/16/2017  . OSA (obstructive sleep apnea) 11/23/2015  . NASH (nonalcoholic steatohepatitis) 08/27/2015  . Dependent edema 08/27/2015  . BMI  40.0-44.9, adult ([place]) 03/15/2015  . Hepatic fibrosis 06/08/2014  . Depression with anxiety 06/06/2013  . Perennial allergic rhinitis 06/06/2013  . Infertility associated with anovulation 06/06/2013    Past Surgical History:  Procedure Laterality Date  . ELBOW LIGAMENT RECONSTRUCTION Right 08/21/2018   Procedure: LATERAL COLLATERAL LIGAMENT REPAIR;  Surgeon: Hiram Gash, MD;  Location: [facility];  Service: Orthopedics;  Laterality: Right;  . FINGER SURGERY Left     index finger  . KNEE SURGERY Left   . LIVER BIOPSY N/A 05/15/2014   Procedure: LIVER BIOPSY;  Surgeon: Inda Castle, MD;  [facility];  Service: Endoscopy;  Laterality: N/A;  ultrasound to mark the liver  . TENNIS ELBOW RELEASE/NIRSCHEL PROCEDURE Right 08/21/2018   Procedure: TENNIS ELBOW RELEASE/NIRSCHEL PROCEDURE;  Surgeon: Hiram Gash, MD;  Location: [facility];  Service: Orthopedics;  Laterality: Right;  . TONSILLECTOMY AND ADENOIDECTOMY  1978  . TYMPANOSTOMY TUBE PLACEMENT    . ULNAR NERVE TRANSPOSITION Right 08/21/2018   Procedure: ULNAR NERVE DECOMPRESSION/TRANSPOSITION;  Surgeon: Hiram Gash, MD;  Location[facility];  Service: Orthopedics;  Laterality: Right;  . wrist cyst Left      OB History    Gravida  0   Para  0   Term  0  Preterm  0   AB  0   Living  0     SAB  0   TAB  0   Ectopic  0   Multiple  0   Live Births  0            Home Medications    Prior to Admission medications   Medication Sig Start Date End Date Taking? Authorizing Provider  Alirocumab (PRALUENT) 150 MG/ML SOAJ Inject 1 Dose into the skin every 14 (fourteen) days. 11/05/18   Pixie Casino, MD  buPROPion (WELLBUTRIN SR) 150 MG 12 hr tablet Take 1 tablet (150 mg total) by mouth 2 (two) times daily. 01/15/19   Abby Potash, PA-C  busPIRone (BUSPAR) 15 MG tablet TAKE ONE TABLET BY MOUTH TWICE A DAY OR AS DIRECTED. Patient taking differently: Take 15 mg by mouth 2 (two) times daily. TAKE ONE TABLET BY MOUTH  TWICE A DAY OR AS DIRECTED. 04/06/18   Delia Chimes A, MD  cetirizine (ZYRTEC) 10 MG tablet Take 10 mg by mouth at bedtime.     [provider]  chlorthalidone (HYGROTON) 25 MG tablet Take 1 tablet (25 mg total) by mouth daily. 01/15/19   Abby Potash, PA-C  citalopram (CELEXA) 40 MG tablet Take 1 tablet by mouth daily. 12/01/18   Forrest Moron, MD  fluticasone (FLONASE) 50 MCG/ACT nasal spray Place 2 sprays into both nostrils daily. Patient taking differently: Place 2 sprays into both nostrils daily as needed for allergies.  03/30/17   Harrison Mons, PA  levothyroxine (SYNTHROID) 88 MCG tablet TAKE ONE TABLET BY MOUTH DAILY 12/25/18   Delia Chimes A, MD  LORazepam (ATIVAN) 1 MG tablet Take 1 tablet (1 mg total) by mouth 2 (two) times daily as needed for anxiety. 04/06/18   Forrest Moron, MD  meclizine (ANTIVERT) 25 MG tablet TAKE ONE TABLET BY MOUTH THREE TIMES A DAY AS NEEDED FOR DIZZINESS Patient taking differently: Take 25 mg by mouth 3 (three) times daily as needed for dizziness.  08/14/18   Wendie Agreste, MD  meloxicam (MOBIC) 15 MG tablet Take 15 mg by mouth daily as needed for pain.  07/23/18   [provider]  metFORMIN (GLUCOPHAGE) 500 MG tablet Take 1 tablet (500 mg total) by mouth 2 (two) times daily with a meal. 01/14/19   Abby Potash, PA-C  metoprolol tartrate (LOPRESSOR) 100 MG tablet Take ONE tablet TWO HOURS prior to test. 09/11/18   Hilty, Nadean Corwin, MD  montelukast (SINGULAIR) 10 MG tablet Take 1 tablet (10 mg total) by mouth at bedtime. 06/11/18   Forrest Moron, MD  Multiple Vitamin (MULTIVITAMIN) tablet Take 1 tablet by mouth daily.    [provider]  Omega-3 Fatty Acids (FISH OIL PO) Take 1 capsule by mouth 2 (two) times a day.     [provider]  omeprazole (PRILOSEC) 20 MG capsule Take 1 capsule (20 mg total) by mouth daily. 01/14/19   Abby Potash, PA-C  SUMAtriptan (IMITREX) 50 MG tablet TAKE ONE TABLET BY MOUTH AT ONSET  OF HEADACHE; MAY REPEAT ONE TABLET IN 2 HOURS IF NEEDED. Patient taking differently: Take 50 mg by mouth See admin instructions. Take 1 tablet at onset of headache; may repeat one tablet in 2 hours if needed. 08/14/18   Forrest Moron, MD  Ubrogepant (UBRELVY) 50 MG TABS Take 50 mg by mouth as needed (may repeat once after 2 hours). 08/15/18   Star Age, MD  Vitamin D,  Ergocalciferol, (DRISDOL) 1.25 MG (50000 UT) CAPS capsule Take 1 capsule (50,000 Units total) by mouth 2 (two) times a week. Tuesday and Friday 01/16/19   Abby Potash, PA-C    Family History Family History  Problem Relation Age of Onset  . Diabetes Mother   . Liver disease Mother        ESLD- unknown etiology  . Obesity Mother   . Diabetes Maternal Grandmother   . Diabetes Paternal Grandmother   . Breast cancer Paternal Grandmother   . Stroke Maternal Grandfather   . Hyperlipidemia Father     Social History Social History   Tobacco Use  . Smoking status: Never Smoker  . Smokeless tobacco: Never Used  Substance Use Topics  . Alcohol use: No    Alcohol/week: 0.0 standard drinks  . Drug use: No     Allergies   Welchol [colesevelam], Lidocaine, Relafen [nabumetone], Celebrex [celecoxib], and Sulfa antibiotics   Review of Systems Review of Systems  Constitutional: Positive for chills. Negative for fever.  HENT: Negative for ear pain and sore throat.   Eyes: Negative for pain and visual disturbance.  Respiratory: Negative for cough and shortness of breath.   Cardiovascular: Negative for chest pain and palpitations.  Gastrointestinal: Positive for abdominal pain and nausea. Negative for vomiting.  Genitourinary: Negative for dysuria and hematuria.  Musculoskeletal: Positive for myalgias. Negative for arthralgias and back pain.  Skin: Negative for color change and rash.  Neurological: Negative for seizures and syncope.  All other systems reviewed and are negative.    Physical Exam Updated Vital Signs  BP (!) 157/74 (BP Location: Left Arm)   Pulse 85   Temp 99 F (37.2 C) (Oral)   Resp 18   LMP  (LMP Unknown)   SpO2 97%   Physical Exam Vitals signs and nursing note reviewed.  Constitutional:      General: She is not in acute distress.    Appearance: She is well-developed.  HENT:     Head: Normocephalic and atraumatic.  Eyes:     Conjunctiva/sclera: Conjunctivae normal.  Neck:     Musculoskeletal: Neck supple.  Cardiovascular:     Rate and Rhythm: Normal rate and regular rhythm.     Heart sounds: No murmur.  Pulmonary:     Effort: Pulmonary effort is normal. No respiratory distress.     Breath sounds: Normal breath sounds.  Abdominal:     Palpations: Abdomen is soft.     Tenderness: There is no abdominal tenderness.     Comments: No focal tenderness noted on exam  Skin:    General: Skin is warm and dry.  Neurological:     Mental Status: She is alert.      ED Treatments / Results  Labs (all labs ordered are listed, but only abnormal results are displayed) Labs Reviewed  COMPREHENSIVE METABOLIC PANEL - Abnormal; Notable for the following components:      Result Value   Glucose, Bld 110 (*)    BUN 22 (*)    Creatinine, Ser 1.16 (*)    Calcium 10.4 (*)    AST 62 (*)    ALT 73 (*)    Alkaline Phosphatase 138 (*)    Total Bilirubin 1.7 (*)    GFR calc non Af Amer 57 (*)    All other components within normal limits  URINALYSIS, ROUTINE W REFLEX MICROSCOPIC - Abnormal; Notable for the following components:   APPearance HAZY (*)    Hgb urine dipstick MODERATE (*)  Protein, ur 100 (*)    Nitrite POSITIVE (*)    Leukocytes,Ua SMALL (*)    WBC, UA >50 (*)    All other components within normal limits  LIPASE, BLOOD  CBC  I-STAT BETA HCG BLOOD, ED ([facility], [facility], [facility] ONLY)    EKG None  Radiology Ct Abdomen Pelvis W Contrast  Result Date: 01/16/2019 CLINICAL DATA:  Abdominal pain and hematuria. EXAM: CT ABDOMEN AND PELVIS WITH CONTRAST TECHNIQUE: Multidetector CT  imaging of the abdomen and pelvis was performed using the standard protocol following bolus administration of intravenous contrast. CONTRAST:  168m OMNIPAQUE IOHEXOL 300 MG/ML  SOLN COMPARISON:  Abdominal ultrasound dated May 16, 2017. FINDINGS: Lower chest: No acute abnormality. Hepatobiliary: Unchanged diffuse hepatic steatosis. Moderate hepatomegaly. No focal liver abnormality. The gallbladder is unremarkable. No biliary dilatation. Pancreas: Unremarkable. No pancreatic ductal dilatation or surrounding inflammatory changes. Spleen: Moderate splenomegaly.  No focal abnormality. Adrenals/Urinary Tract: Adrenal glands are unremarkable. Kidneys are normal, without renal calculi, focal lesion, or hydronephrosis. Bladder is unremarkable. Stomach/Bowel: Stomach is within normal limits. Appendix appears normal. No evidence of bowel wall thickening, distention, or inflammatory changes. Vascular/Lymphatic: No significant vascular findings are present. Several mildly enlarged periportal and portacaval lymph nodes measuring up to 1.3 cm in short axis, likely reactive. Reproductive: Uterus and bilateral adnexa are unremarkable. Other: No abdominal wall hernia or abnormality. No abdominopelvic ascites. No pneumoperitoneum. Musculoskeletal: No acute or significant osseous findings. IMPRESSION: 1. No acute intra-abdominal process. Normal appendix. No urolithiasis. 2. Moderate hepatosplenomegaly.  Diffuse hepatic steatosis. Electronically Signed   By: WTitus DubinM.D.   On: 01/16/2019 21:34    Procedures Procedures (including critical care time)  Medications Ordered in ED Medications  sodium chloride flush (NS) 0.9 % injection 3 mL (has no administration in time range)  sodium chloride (PF) 0.9 % injection (has no administration in time range)  cephALEXin (KEFLEX) capsule 500 mg (has no administration in time range)  iohexol (OMNIPAQUE) 300 MG/ML solution 100 mL (100 mLs Intravenous Contrast Given 01/16/19 2118)      Initial Impression / Assessment and Plan / ED Course  I have reviewed the triage vital signs and the nursing notes.  Pertinent labs & imaging results that were available during my care of the patient were reviewed by me and considered in my medical decision making (see chart for details).       43year old lady presents to ER with generalized weakness, hematuria.  On exam patient is well-appearing, afebrile with normal vital signs.  Abdomen soft.  Given new onset hematuria, obtain CT scan to rule out kidney stone, acute abdominal process.  CT scan was negative.  Suspect her symptoms are from a urinary tract infection.  Will start therapy with cephalexin.  Recommend recheck with her primary doctor.  Reviewed return precautions.    After the discussed management above, the patient was determined to be safe for discharge.  The patient was in agreement with this plan and all questions regarding their care were answered.  ED return precautions were discussed and the patient will return to the ED with any significant worsening of condition.    Final Clinical Impressions(s) / ED Diagnoses   Final diagnoses:  Urinary tract infection with hematuria, site unspecified    ED Discharge Orders    None       DLucrezia Starch MD 01/17/19 [phone]

## 2019-01-16 NOTE — Progress Notes (Signed)
Virtual Visit via Telephone Note  I connected with Melanie [name] on 01/16/19 at 3:50 PM by telephone and verified that I am speaking with the correct person using two identifiers.   I discussed the limitations, risks, security and privacy concerns of performing an evaluation and management service by telephone and the availability of in person appointments. I also discussed with the patient that there may be a patient responsible charge related to this service. The patient expressed understanding and agreed to proceed, consent obtained. Husband on ohe as well with some of history.   Chief complaint: HA, weakness, fatigue, nausea, diarrhea.   History of Present Illness: Melanie [name] is a 43 y.o. female  Started with pink urine, few times past week. No dysuria/urgency/frequency. Last UTI with typical sx's a year ago.  No measured fever, but usual normal in 97, measuring 98 recently.  Headache, fatigue started yesterday afternoon, progressive generalized weakness since yesterday. Increased sleep last night. Dizzy/lightheaded past 2 days. Has some chronic HA, but some different HA.  Bodyaches, no shaking chills - min chills with temp.  No cough, dyspnea, no change in taste/smell.  Feels worse today with progressive worsening during the day.  No known sick contacts, no contact with covid positive person.  Nausea, no vomiting. Some abd pain, min back pain.  Has been able to drink water, some food today. Diarrhea once today.   sepsis few years ago - treated outpatient. Pyelo? Without uti sx's.     Patient Active Problem List   Diagnosis Date Noted  . Vitamin D deficiency 12/26/2018  . Class 3 severe obesity with serious comorbidity and body mass index (BMI) of 45.0 to 49.9 in adult ([place]) 12/26/2018  . Right tennis elbow 07/30/2018  . Cubital tunnel syndrome 07/30/2018  . Chronic pain of both feet 01/23/2018  . Bilateral calcaneal spurs 01/23/2018  . Other fatigue 05/23/2017  .  Shortness of breath on exertion 05/23/2017  . Prediabetes 05/23/2017  . Liver fibrosis 05/23/2017  . Hyperlipidemia 04/16/2017  . OSA (obstructive sleep apnea) 11/23/2015  . NASH (nonalcoholic steatohepatitis) 08/27/2015  . Dependent edema 08/27/2015  . BMI 40.0-44.9, adult ([place]) 03/15/2015  . Hepatic fibrosis 06/08/2014  . Depression with anxiety 06/06/2013  . Perennial allergic rhinitis 06/06/2013  . Infertility associated with anovulation 06/06/2013   Past Medical History:  Diagnosis Date  . Allergy   . Anemia   . Anxiety   . Back pain   . Constipation   . Depression   . Fatty liver   . GERD (gastroesophageal reflux disease)   . Hyperlipidemia   . Infertility, female   . Joint pain   . Kidney problem   . 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 Migraines    LIGHT SENSITIVITY , NOW ON IMITREX SINCE 08-02-2018,NOTICES IMPROVEMENT   . Multiple food allergies   . Muscle pain   . Numbness and tingling in both hands   . Pre-diabetes   . Shortness of breath    was due to a medication side effect, now off medication , SOB resolved   . Sleep apnea   . Stress   . Swelling of both lower extremities    Past Surgical History:  Procedure Laterality Date  . ELBOW LIGAMENT RECONSTRUCTION Right 08/21/2018   Procedure: LATERAL COLLATERAL LIGAMENT REPAIR;  Surgeon: Hiram Gash, MD;  [facility];  Service: Orthopedics;  Laterality: Right;  . FINGER SURGERY Left  index finger  . KNEE SURGERY Left   . LIVER BIOPSY N/A 05/15/2014   Procedure: LIVER BIOPSY;  Surgeon: Inda Castle, MD;  [facility];  Service: Endoscopy;  Laterality: N/A;  ultrasound to mark the liver  . TENNIS ELBOW RELEASE/NIRSCHEL PROCEDURE Right 08/21/2018   Procedure: TENNIS ELBOW RELEASE/NIRSCHEL PROCEDURE;  Surgeon: Hiram Gash, MD;  [facility];  Service: Orthopedics;  Laterality: Right;  . TONSILLECTOMY AND ADENOIDECTOMY  1978  . TYMPANOSTOMY TUBE PLACEMENT    .  ULNAR NERVE TRANSPOSITION Right 08/21/2018   Procedure: ULNAR NERVE DECOMPRESSION/TRANSPOSITION;  Surgeon: Hiram Gash, MD;  [facility];  Service: Orthopedics;  Laterality: Right;  . wrist cyst Left    Allergies  Allergen Reactions  . Welchol [Colesevelam] Shortness Of Breath  . Lidocaine Other (See Comments)    Flu like sx's  . Relafen [Nabumetone] Other (See Comments)    migraine  . Celebrex [Celecoxib] Rash  . Sulfa Antibiotics Rash   Prior to Admission medications   Medication Sig Start Date End Date Taking? Authorizing Provider  Alirocumab (PRALUENT) 150 MG/ML SOAJ Inject 1 Dose into the skin every 14 (fourteen) days. 11/05/18  Yes Pixie Casino, MD  buPROPion (WELLBUTRIN SR) 150 MG 12 hr tablet Take 1 tablet (150 mg total) by mouth 2 (two) times daily. 01/15/19  Yes Abby Potash, PA-C  busPIRone (BUSPAR) 15 MG tablet TAKE ONE TABLET BY MOUTH TWICE A DAY OR AS DIRECTED. Patient taking differently: Take 15 mg by mouth 2 (two) times daily. TAKE ONE TABLET BY MOUTH TWICE A DAY OR AS DIRECTED. 04/06/18  Yes Stallings, Zoe A, MD  cetirizine (ZYRTEC) 10 MG tablet Take 10 mg by mouth at bedtime.    Yes [provider]  chlorthalidone (HYGROTON) 25 MG tablet Take 1 tablet (25 mg total) by mouth daily. 01/15/19  Yes Abby Potash, PA-C  citalopram (CELEXA) 40 MG tablet Take 1 tablet by mouth daily. 12/01/18  Yes Stallings, Zoe A, MD  fluticasone (FLONASE) 50 MCG/ACT nasal spray Place 2 sprays into both nostrils daily. Patient taking differently: Place 2 sprays into both nostrils daily as needed for allergies.  03/30/17  Yes Jeffery, Domingo Mend, PA  levothyroxine (SYNTHROID) 88 MCG tablet TAKE ONE TABLET BY MOUTH DAILY 12/25/18  Yes Stallings, Zoe A, MD  LORazepam (ATIVAN) 1 MG tablet Take 1 tablet (1 mg total) by mouth 2 (two) times daily as needed for anxiety. 04/06/18  Yes Forrest Moron, MD  meclizine (ANTIVERT) 25 MG tablet TAKE ONE TABLET BY MOUTH THREE TIMES A DAY AS  NEEDED FOR DIZZINESS Patient taking differently: Take 25 mg by mouth 3 (three) times daily as needed for dizziness.  08/14/18  Yes Wendie Agreste, MD  meloxicam (MOBIC) 15 MG tablet Take 15 mg by mouth daily as needed for pain.  07/23/18  Yes [provider]  metFORMIN (GLUCOPHAGE) 500 MG tablet Take 1 tablet (500 mg total) by mouth 2 (two) times daily with a meal. 01/14/19  Yes Abby Potash, PA-C  metoprolol tartrate (LOPRESSOR) 100 MG tablet Take ONE tablet TWO HOURS prior to test. 09/11/18  Yes Hilty, Nadean Corwin, MD  montelukast (SINGULAIR) 10 MG tablet Take 1 tablet (10 mg total) by mouth at bedtime. 06/11/18  Yes Forrest Moron, MD  Multiple Vitamin (MULTIVITAMIN) tablet Take 1 tablet by mouth daily.   Yes [provider]  Omega-3 Fatty Acids (FISH OIL PO) Take 1 capsule by mouth 2 (two) times a day.  Yes [provider]  omeprazole (PRILOSEC) 20 MG capsule Take 1 capsule (20 mg total) by mouth daily. 01/14/19  Yes Abby Potash, PA-C  SUMAtriptan (IMITREX) 50 MG tablet TAKE ONE TABLET BY MOUTH AT ONSET OF HEADACHE; MAY REPEAT ONE TABLET IN 2 HOURS IF NEEDED. Patient taking differently: Take 50 mg by mouth See admin instructions. Take 1 tablet at onset of headache; may repeat one tablet in 2 hours if needed. 08/14/18  Yes Stallings, Zoe A, MD  Ubrogepant (UBRELVY) 50 MG TABS Take 50 mg by mouth as needed (may repeat once after 2 hours). 08/15/18  Yes Star Age, MD  Vitamin D, Ergocalciferol, (DRISDOL) 1.25 MG (50000 UT) CAPS capsule Take 1 capsule (50,000 Units total) by mouth 2 (two) times a week. Tuesday and Friday 01/16/19  Yes Abby Potash, PA-C   Social History   Socioeconomic History  . Marital status: Married    Spouse name: [name]  . Number of children: 5  . Years of education: in [employer] [place] program  . Highest education level: Bachelor's degree (e.g., BA, AB, BS)  Occupational History  . Occupation: Product manager: [employer]     Comment: ESL  Social Needs  . Financial resource strain: Not on file  . Food insecurity    Worry: Not on file    Inability: Not on file  . Transportation needs    Medical: Not on file    Non-medical: Not on file  Tobacco Use  . Smoking status: Never Smoker  . Smokeless tobacco: Never Used  Substance and Sexual Activity  . Alcohol use: No    Alcohol/week: 0.0 standard drinks  . Drug use: No  . Sexual activity: Yes    Partners: Male    Birth control/protection: Post-menopausal  Lifestyle  . Physical activity    Days per week: Not on file    Minutes per session: Not on file  . Stress: Not on file  Relationships  . Social Herbalist on phone: Not on file    Gets together: Not on file    Attends religious service: Not on file    Active member of club or organization: Not on file    Attends meetings of clubs or organizations: Not on file    Relationship status: Not on file  . Intimate partner violence    Fear of current or ex partner: Not on file    Emotionally abused: Not on file    Physically abused: Not on file    Forced sexual activity: Not on file  Other Topics Concern  . Not on file  Social History Narrative   Lives with her husband and their 5 adpoted children.    Adopted 2 sets of siblings, all with psychiatric diagnoses (ADHD, Asperger's, ODD, PTSD, depression, bipolar disorder).   Her husband has Asperger's, and works in disability services.   Occasionally drinks coke or pepsi      Observations/Objective: Speaking full sentences.  No respiratory distress. There were no vitals filed for this visit.   Assessment and Plan: Hematuria, unspecified type - Plan: POCT urinalysis dipstick  Dizziness  Nonintractable headache, unspecified chronicity pattern, unspecified headache type  Body aches  Nausea without vomiting  Diarrhea, unspecified type  Abdominal pain, unspecified abdominal location  Acute back pain, unspecified back location,  unspecified back pain laterality  Initial discolored urine, possible hematuria few times past week without other UTI symptoms.  Now with progressive fatigue, dizziness, body aches, headache,  nausea, abdominal pain and back pain.  Reports progressive worsening of symptoms throughout today as well.  Has tolerated p.o. fluids.  -Differential includes pyelonephritis with progressive worsening today.  Less likely COVID-19, but with myalgias, headache in differential.  -With progressive worsening recommended emergency room evaluation tonight as may need IV antibiotics, IV fluids, further testing as above, decision on outpatient treatment.  Will go by private vehicle.   Follow Up Instructions:   to ER tonight.   I discussed the assessment and treatment plan with the patient. The patient was provided an opportunity to ask questions and all were answered. The patient agreed with the plan and demonstrated an understanding of the instructions.   The patient was advised to call back or seek an in-person evaluation if the symptoms worsen or if the condition fails to improve as anticipated.  I provided 20 minutes of non-face-to-face time during this encounter.  Signed,   Merri Ray, MD [facility].  01/16/19   .jg

## 2019-01-16 NOTE — Progress Notes (Signed)
Results for orders placed or performed in visit on 01/16/19  POCT urinalysis dipstick  Result Value Ref Range   Color, UA yellow (A) yellow   Clarity, UA clear clear   Glucose, UA negative negative mg/dL   Bilirubin, UA negative negative   Ketones, POC UA negative negative mg/dL   Spec Grav, UA 1.020 1.010 - 1.025   Blood, UA moderate (A) negative   pH, UA 7.0 5.0 - 8.0   Protein Ur, POC =100 (A) negative mg/dL   Urobilinogen, UA 1.0 0.2 or 1.0 E.U./dL   Nitrite, UA Positive (A) Negative   Leukocytes, UA Small (1+) (A) Negative   Addendum to previous note.  I was not aware patient initially presented in office and had provided urine specimen before she was converted to virtual/telemedicine visit due to the screening questions.  Urine results received as above.  Spoke to patient on phone.  It does appear that urinary tract infection and possible pyelonephritis may be cause of symptoms, but still with dizziness and progressive worsening would recommend emergency room evaluation for possible IV hydration, IV antibiotics and still assessment for stability for outpatient treatment.  Patient made aware.

## 2019-01-16 NOTE — Discharge Instructions (Signed)
Please schedule follow-up appointment with your primary care doctor for recheck within the next couple days.  Please take antibiotic as prescribed.  If you develop fever, lethargy, vomiting or other new concerning symptom please return to ER for recheck.

## 2019-01-16 NOTE — ED Triage Notes (Signed)
Pt presents with c/o hematuria. Pt went to her PCP for this complaint as well as weakness and abdominal pain.

## 2019-01-16 NOTE — Telephone Encounter (Signed)
Please ignore

## 2019-01-21 MED ORDER — LEVOTHYROXINE SODIUM 88 MCG PO TABS: 88.0000 ug | ORAL_TABLET | Freq: Every day | ORAL | 0 refills | Status: DC

## 2019-01-22 ENCOUNTER — Other Ambulatory Visit: Payer: Self-pay | Admitting: Family Medicine

## 2019-01-22 DIAGNOSIS — E039 Hypothyroidism, unspecified: Secondary | ICD-10-CM

## 2019-01-24 ENCOUNTER — Encounter: Payer: Self-pay | Admitting: Physician Assistant

## 2019-01-24 ENCOUNTER — Telehealth (INDEPENDENT_AMBULATORY_CARE_PROVIDER_SITE_OTHER): Payer: BC Managed Care – PPO | Admitting: Physician Assistant

## 2019-01-24 ENCOUNTER — Telehealth: Payer: Self-pay

## 2019-01-24 ENCOUNTER — Telehealth: Payer: Self-pay | Admitting: Physician Assistant

## 2019-01-24 VITALS — Ht 68.0 in | Wt 325.0 lb

## 2019-01-24 DIAGNOSIS — E7849 Other hyperlipidemia: Secondary | ICD-10-CM

## 2019-01-24 MED ORDER — LIVALO 2 MG PO TABS: 2.0000 mg | ORAL_TABLET | Freq: Every day | ORAL | 5 refills | Status: DC

## 2019-01-24 NOTE — Telephone Encounter (Signed)

## 2019-01-24 NOTE — Telephone Encounter (Signed)
Called patient to discuss AVS instructions gave Melanie [name]'s recommendations and patient voiced understanding. AVS summary mailed to patient.  AVS mailed out

## 2019-01-24 NOTE — Telephone Encounter (Signed)
Left message for patient to call and schedule 6-8 week LIPID clinic follow up with Dr. Debara Pickett per 01/24/19 staff message from Jacqulynn Cadet, [place]

## 2019-01-24 NOTE — Patient Instructions (Addendum)
Medication Instructions:   Start Livalo 2 mg daily  *If you need a refill on your cardiac medications before your next appointment, please call your pharmacy*  Lab Work: You will need to have labs (blood work) drawn in 6 weeks. Labs need to be completed before follow up with Dr. Debara Pickett :  Fasting Lipid Panel-DO NOT EAT OR DRINK PAST MIDNIGHT (WATER IS OK)  Liver Function Test  If you have labs (blood work) drawn today and your tests are completely normal, you will receive your results only by: Marland Kitchen MyChart Message (if you have MyChart) OR . A paper copy in the mail If you have any lab test that is abnormal or we need to change your treatment, we will call you to review the results.  Testing/Procedures: NONE ordered at this time of appointment   Follow-Up: At [facility], you and your health needs are our priority.  As part of our continuing mission to provide you with exceptional heart care, we have created designated Provider Care Teams.  These Care Teams include your primary Cardiologist (physician) and Advanced Practice Providers (APPs -  Physician Assistants and Nurse Practitioners) who all work together to provide you with the care you need, when you need it.  Your next appointment:   6-8 weeks in [facility]   The format for your next appointment:   In Person  Provider:   K. Mali Hilty, MD  Other Instructions   Continue to diet and exercise

## 2019-01-24 NOTE — Progress Notes (Signed)
Virtual Visit via Telephone Note   This visit type was conducted due to national recommendations for restrictions regarding the COVID-19 Pandemic (e.g. social distancing) in an effort to limit this patient's exposure and mitigate transmission in our community.  Due to her co-morbid illnesses, this patient is at least at moderate risk for complications without adequate follow up.  This format is felt to be most appropriate for this patient at this time.  The patient did not have access to video technology/had technical difficulties with video requiring transitioning to audio format only (telephone).  All issues noted in this document were discussed and addressed.  No physical exam could be performed with this format.  Please refer to the patient's chart for her  consent to telehealth for [facility].   Date:  01/24/2019   ID:  Melanie [name], DOB 1976/02/19, MRN [id number]  Patient Location: Home Provider Location: Home  PCP:  Doristine Bosworth, MD  Cardiologist:  Chrystie Nose, MD  Electrophysiologist:  None   Evaluation Performed:  Follow-Up Visit  Chief Complaint:  followup  History of Present Illness:    Melanie [name] is a 43 y.o. female with past medical history of hyperlipidemia, GERD, fatty liver disease and obstructive sleep apnea.  She underwent coronary CT was 0 calcium score and a right dominant coronary arteries.  Despite so, she continued to have quite significant dyslipidemia.  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  This is not associated with increased coronary risk.  She was last seen by Dr. 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  She was started on PCSK9 inhibitor with Praluent every 2 weeks.  Last week, she went to the ED due to urinary tract infection and hematuria.  CT of abdomen did not show kidney stone or acute abdominal process.  She was treated with  cephalexin.  Patient was contacted today via telephone visit.  She denies any chest pain or shortness of breath.  She is currently at work.  We discussed her recent lipid panel.  Her last lipid panel obtained in October 2020 showed significant improvement in lipid profile.  Although total cholesterol remains borderline elevated at 227, triglyceride elevated at 180 and LDL elevated at 135.  We discussed various options and he eventually agreed to add Livalo to her Praluent.  She will need 6 weeks fasting lipid panel and liver function test.  She says that she may have tried some of the stronger statins in the past however the concern was elevated liver enzyme.  I have reviewed her most recent lab work, I suspect she has borderline elevated liver enzymes due to fatty liver disease.  We will follow-up on her liver enzymes closely after addition of Livalo.  The patient does not have symptoms concerning for COVID-19 infection (fever, chills, cough, or new shortness of breath).    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    Migraines    LIGHT SENSITIVITY , NOW ON IMITREX SINCE 08-02-2018,NOTICES IMPROVEMENT    Multiple food  allergies    Muscle pain    Numbness and tingling in both hands    Pre-diabetes    Shortness of breath    was due to a medication side effect, now off medication , SOB resolved    Sleep apnea    Stress    Swelling of both lower extremities    Past Surgical History:  Procedure Laterality Date   ELBOW LIGAMENT RECONSTRUCTION Right 08/21/2018   Procedure: LATERAL COLLATERAL LIGAMENT REPAIR;  Surgeon: Bjorn Pippin, MD;  Location: [facility];  Service: Orthopedics;  Laterality: Right;   FINGER  SURGERY Left     index finger   KNEE SURGERY Left    LIVER BIOPSY N/A 05/15/2014   Procedure: LIVER BIOPSY;  Surgeon: Louis Meckel, MD;  Location: [facility];  Service: Endoscopy;  Laterality: N/A;  ultrasound to mark the liver   TENNIS ELBOW RELEASE/NIRSCHEL PROCEDURE Right 08/21/2018   Procedure: TENNIS ELBOW RELEASE/NIRSCHEL PROCEDURE;  Surgeon: Bjorn Pippin, MD;  Location: [facility];  Service: Orthopedics;  Laterality: Right;   TONSILLECTOMY AND ADENOIDECTOMY  1978   TYMPANOSTOMY TUBE PLACEMENT     ULNAR NERVE TRANSPOSITION Right 08/21/2018   Procedure: ULNAR NERVE DECOMPRESSION/TRANSPOSITION;  Surgeon: Bjorn Pippin, MD;  Location: [facility];  Service: Orthopedics;  Laterality: Right;   wrist cyst Left      Current Meds  Medication Sig   Alirocumab (PRALUENT) 150 MG/ML SOAJ Inject 1 Dose into the skin every 14 (fourteen) days.   buPROPion (WELLBUTRIN SR) 150 MG 12 hr tablet Take 1 tablet (150 mg total) by mouth 2 (two) times daily.   busPIRone (BUSPAR) 15 MG tablet TAKE ONE TABLET BY MOUTH TWICE A DAY OR AS DIRECTED. (Patient taking differently: Take 15 mg by mouth 2 (two) times daily. TAKE ONE TABLET BY MOUTH TWICE A DAY OR AS DIRECTED.)   cetirizine (ZYRTEC) 10 MG tablet Take 10 mg by mouth at bedtime.    chlorthalidone (HYGROTON) 25 MG tablet Take 1 tablet (25 mg total) by mouth daily.   citalopram (CELEXA) 40 MG tablet Take 1 tablet by mouth daily.   fluticasone (FLONASE) 50 MCG/ACT nasal spray Place 2 sprays into both nostrils daily. (Patient taking differently: Place 2 sprays into both nostrils daily as needed for allergies. )   levothyroxine (SYNTHROID) 88 MCG tablet TAKE ONE TABLET BY MOUTH DAILY   LORazepam (ATIVAN) 1 MG tablet Take 1 tablet (1 mg total) by mouth 2 (two) times daily as needed for anxiety.   meclizine (ANTIVERT) 25 MG tablet TAKE ONE TABLET BY MOUTH THREE TIMES A DAY AS NEEDED FOR DIZZINESS (Patient taking differently: Take 25 mg by mouth 3 (three)  times daily as needed for dizziness. )   meloxicam (MOBIC) 15 MG tablet Take 15 mg by mouth daily as needed for pain.    metFORMIN (GLUCOPHAGE) 500 MG tablet Take 1 tablet (500 mg total) by mouth 2 (two) times daily with a meal.   metoprolol tartrate (LOPRESSOR) 100 MG tablet Take ONE tablet TWO HOURS prior to test.   montelukast (SINGULAIR) 10 MG tablet Take 1 tablet (10 mg total) by mouth at bedtime.   Multiple Vitamin (MULTIVITAMIN) tablet Take 1 tablet by mouth daily.   Omega-3 Fatty Acids (FISH OIL PO) Take 1 capsule by mouth 2 (two) times a day.    omeprazole (PRILOSEC) 20 MG capsule Take 1 capsule (20 mg total) by mouth daily.   ondansetron (ZOFRAN) 4 MG tablet Take 1 tablet (4 mg total) by  mouth every 8 (eight) hours as needed for nausea or vomiting.   SUMAtriptan (IMITREX) 50 MG tablet TAKE ONE TABLET BY MOUTH AT ONSET OF HEADACHE; MAY REPEAT ONE TABLET IN 2 HOURS IF NEEDED. (Patient taking differently: Take 50 mg by mouth See admin instructions. Take 1 tablet at onset of headache; may repeat one tablet in 2 hours if needed.)   Ubrogepant (UBRELVY) 50 MG TABS Take 50 mg by mouth as needed (may repeat once after 2 hours).   Vitamin D, Ergocalciferol, (DRISDOL) 1.25 MG (50000 UT) CAPS capsule Take 1 capsule (50,000 Units total) by mouth 2 (two) times a week. Tuesday and Friday     Allergies:   Welchol [colesevelam], Lidocaine, Relafen [nabumetone], Celebrex [celecoxib], and Sulfa antibiotics   Social History   Tobacco Use   Smoking status: Never Smoker   Smokeless tobacco: Never Used  Substance Use Topics   Alcohol use: No    Alcohol/week: 0.0 standard drinks   Drug use: No     Family Hx: The patient's family history includes Breast cancer in her paternal grandmother; Diabetes in her maternal grandmother, mother, and paternal grandmother; Hyperlipidemia in her father; Liver disease in her mother; Obesity in her mother; Stroke in her maternal grandfather.  ROS:     Please see the history of present illness.     All other systems reviewed and are negative.   Prior CV studies:   The following studies were reviewed today:  Echo 05/25/2017 LV EF: 60% -   65% Study Conclusions  - Left ventricle: The cavity size was normal. Systolic function was   normal. The estimated ejection fraction was in the range of 60%   to 65%. Wall motion was normal; there were no regional wall   motion abnormalities. Left ventricular diastolic function   parameters were normal. - Aortic valve: There was no regurgitation. - Mitral valve: Transvalvular velocity was within the normal range.   There was no evidence for stenosis. There was no regurgitation. - Right ventricle: The cavity size was normal. Wall thickness was   normal. Systolic function was normal. - Tricuspid valve: There was trivial regurgitation. - Pulmonary arteries: Systolic pressure was within the normal   range. PA peak pressure: 26 mm Hg (S).  Labs/Other Tests and Data Reviewed:    EKG:  An ECG dated 07/09/2018 was personally reviewed today and demonstrated:  Normal sinus rhythm without significant ST-T wave changes  Recent Labs: 12/24/2018: TSH 3.950 01/16/2019: ALT 73; BUN 22; Creatinine, Ser 1.16; Hemoglobin 13.9; Platelets 221; Potassium 3.6; Sodium 138   Recent Lipid Panel Lab Results  Component Value Date/Time   CHOL 227 (H) 12/24/2018 08:18 AM   TRIG 180 (H) 12/24/2018 08:18 AM   HDL 60 12/24/2018 08:18 AM   CHOLHDL 7.0 (H) 07/26/2018 03:49 PM   CHOLHDL 5.0 06/06/2013 04:54 PM   LDLCALC 135 (H) 12/24/2018 08:18 AM    Wt Readings from Last 3 Encounters:  01/24/19 (!) 325 lb (147.4 kg)  01/16/19 (!) 325 lb (147.4 kg)  01/14/19 (!) 332 lb (150.6 kg)     Objective:    Vital Signs:  Ht 5\' 8"  (1.727 m)    Wt (!) 325 lb (147.4 kg)    BMI 49.42 kg/m    VITAL SIGNS:  reviewed  ASSESSMENT & PLAN:    1. Hyperlipidemia: Fortunately, despite her elevated hyperlipidemia, last coronary CT  showed no coronary artery disease.  LDL goal less than 100.  After starting on the Praluent, her lipid  profile has markedly improved, however total cholesterol, LDL and triglycerides remain elevated above goal.  I recommended an addition of Livalo to her Praluent.  She has a history of elevated liver enzyme likely due to fatty liver disease.  Patient says she likely has tried some to stronger statins in the past but could not tolerate them due to elevated liver enzymes, we will follow-up with lab work in 6 weeks.  COVID-19 Education: The signs and symptoms of COVID-19 were discussed with the patient and how to seek care for testing (follow up with PCP or arrange E-visit).  The importance of social distancing was discussed today.  Time:   Today, I have spent 13 minutes with the patient with telehealth technology discussing the above problems.     Medication Adjustments/Labs and Tests Ordered: Current medicines are reviewed at length with the patient today.  Concerns regarding medicines are outlined above.   Tests Ordered: No orders of the defined types were placed in this encounter.   Medication Changes: No orders of the defined types were placed in this encounter.   Follow Up:  Either In Person or Virtual in 8 week(s)  Signed, Azalee Course, PA  01/24/2019 7:55 AM    [facility]

## 2019-01-27 NOTE — Telephone Encounter (Signed)
Left message for patient to call and schedule 6-8 week follow up with Dr. Debara Pickett in the LIPID clinic

## 2019-01-29 ENCOUNTER — Encounter (INDEPENDENT_AMBULATORY_CARE_PROVIDER_SITE_OTHER): Payer: Self-pay | Admitting: Physician Assistant

## 2019-01-29 ENCOUNTER — Other Ambulatory Visit: Payer: Self-pay

## 2019-01-29 ENCOUNTER — Ambulatory Visit (INDEPENDENT_AMBULATORY_CARE_PROVIDER_SITE_OTHER): Payer: BC Managed Care – PPO | Admitting: Physician Assistant

## 2019-01-29 VITALS — BP 129/81 | HR 63 | Temp 98.4°F | Ht 68.0 in | Wt 331.0 lb

## 2019-01-29 DIAGNOSIS — Z6841 Body Mass Index (BMI) 40.0 and over, adult: Secondary | ICD-10-CM | POA: Diagnosis not present

## 2019-01-29 DIAGNOSIS — E7849 Other hyperlipidemia: Secondary | ICD-10-CM

## 2019-01-31 NOTE — Telephone Encounter (Signed)
Left message 01/27/19 and  01/31/19 for patient to call and schedule 6-8 week virtual LIPID clinic visit with Dr. Debara Pickett.  Will also send My Chart message

## 2019-02-03 NOTE — Progress Notes (Signed)
Office: [phone]  /  Fax: [phone]   HPI:   Chief Complaint: OBESITY Melanie [name] is here to discuss her progress with her obesity treatment plan. She is on the Category 3 plan and is following her eating plan approximately 80 % of the time. She states she is exercising 0 minutes 0 times per week. Melanie [name] reports that she recently had a UTI, and she did not have an appetite for a few days. She finds it difficult to eat all of the meat on the plan. Her weight is (!) 331 lb (150.1 kg) today and has had a weight loss of 1 pound over a period of 2 weeks since her last visit. She has lost 7 lbs since starting treatment with [place].  Hyperlipidemia Melanie [name] has hyperlipidemia and she is not on medications. She reports a family history of hyperlipidemia. Melanie [name] has been trying to improve her cholesterol levels with intensive lifestyle modification including a low saturated fat diet, exercise and weight loss. She denies any chest pain.  ASSESSMENT AND PLAN:  Other hyperlipidemia  Class 3 severe obesity with serious comorbidity and body mass index (BMI) of 50.0 to 59.9 in adult, unspecified obesity type (Melanie [name])  PLAN:  Hyperlipidemia Melanie [name] was informed of the American Heart Association Guidelines emphasizing intensive lifestyle modifications as the first line treatment for hyperlipidemia. We discussed many lifestyle modifications today in depth, and Melanie [name] will continue to work on decreasing saturated fats such as fatty red meat, butter and many fried foods. She will also increase vegetables and lean protein in her diet and continue to work on exercise and weight loss efforts.  I spent > than 50% of the 25 minute visit on counseling as documented in the note.  Obesity Melanie [name] is currently in the action stage of change. As such, her goal is to continue with weight loss efforts She has agreed to follow the Pescatarian eating plan +100 calories Melanie [name] has been instructed to work up to a goal of 150 minutes of  combined cardio and strengthening exercise per week for weight loss and overall health benefits. We discussed the following Behavioral Modification Strategies today: no skipping meals and increasing lean protein intake  Melanie [name] has agreed to follow up with our clinic in 3 weeks. She was informed of the importance of frequent follow up visits to maximize her success with intensive lifestyle modifications for her multiple health conditions.  ALLERGIES: Allergies  Allergen Reactions   Welchol [Colesevelam] Shortness Of Breath   Lidocaine Other (See Comments)    Flu like sx's   Relafen [Nabumetone] Other (See Comments)    migraine   Celebrex [Celecoxib] Rash   Sulfa Antibiotics Rash    MEDICATIONS: Current Outpatient Medications on File Prior to Visit  Medication Sig Dispense Refill   Alirocumab (PRALUENT) 150 MG/ML SOAJ Inject 1 Dose into the skin every 14 (fourteen) days. 2 pen 11   buPROPion (WELLBUTRIN SR) 150 MG 12 hr tablet Take 1 tablet (150 mg total) by mouth 2 (two) times daily. 180 tablet 0   busPIRone (BUSPAR) 15 MG tablet TAKE ONE TABLET BY MOUTH TWICE A DAY OR AS DIRECTED. (Patient taking differently: Take 15 mg by mouth 2 (two) times daily. TAKE ONE TABLET BY MOUTH TWICE A DAY OR AS DIRECTED.) 180 tablet 3   cetirizine (ZYRTEC) 10 MG tablet Take 10 mg by mouth at bedtime.      chlorthalidone (HYGROTON) 25 MG tablet Take 1 tablet (25 mg total) by mouth daily. 90 tablet 0  citalopram (CELEXA) 40 MG tablet Take 1 tablet by mouth daily. 90 tablet 1   fluticasone (FLONASE) 50 MCG/ACT nasal spray Place 2 sprays into both nostrils daily. (Patient taking differently: Place 2 sprays into both nostrils daily as needed for allergies. ) 48 g 3   levothyroxine (SYNTHROID) 88 MCG tablet TAKE ONE TABLET BY MOUTH DAILY 30 tablet 0   LORazepam (ATIVAN) 1 MG tablet Take 1 tablet (1 mg total) by mouth 2 (two) times daily as needed for anxiety. 20 tablet 0   meclizine (ANTIVERT) 25 MG  tablet TAKE ONE TABLET BY MOUTH THREE TIMES A DAY AS NEEDED FOR DIZZINESS (Patient taking differently: Take 25 mg by mouth 3 (three) times daily as needed for dizziness. ) 30 tablet 0   meloxicam (MOBIC) 15 MG tablet Take 15 mg by mouth daily as needed for pain.      metFORMIN (GLUCOPHAGE) 500 MG tablet Take 1 tablet (500 mg total) by mouth 2 (two) times daily with a meal. 180 tablet 0   metoprolol tartrate (LOPRESSOR) 100 MG tablet Take ONE tablet TWO HOURS prior to test. 1 tablet 0   montelukast (SINGULAIR) 10 MG tablet Take 1 tablet (10 mg total) by mouth at bedtime. 30 tablet 11   Multiple Vitamin (MULTIVITAMIN) tablet Take 1 tablet by mouth daily.     Omega-3 Fatty Acids (FISH OIL PO) Take 1 capsule by mouth 2 (two) times a day.      omeprazole (PRILOSEC) 20 MG capsule Take 1 capsule (20 mg total) by mouth daily. 30 capsule 0   ondansetron (ZOFRAN) 4 MG tablet Take 1 tablet (4 mg total) by mouth every 8 (eight) hours as needed for nausea or vomiting. 12 tablet 0   Pitavastatin Calcium (LIVALO) 2 MG TABS Take 1 tablet (2 mg total) by mouth daily. 30 tablet 5   SUMAtriptan (IMITREX) 50 MG tablet TAKE ONE TABLET BY MOUTH AT ONSET OF HEADACHE; MAY REPEAT ONE TABLET IN 2 HOURS IF NEEDED. (Patient taking differently: Take 50 mg by mouth See admin instructions. Take 1 tablet at onset of headache; may repeat one tablet in 2 hours if needed.) 10 tablet 0   Ubrogepant (UBRELVY) 50 MG TABS Take 50 mg by mouth as needed (may repeat once after 2 hours). 20 tablet 3   Vitamin D, Ergocalciferol, (DRISDOL) 1.25 MG (50000 UT) CAPS capsule Take 1 capsule (50,000 Units total) by mouth 2 (two) times a week. Tuesday and Friday 10 capsule 0   No current facility-administered medications on file prior to visit.     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    Migraines    LIGHT SENSITIVITY , NOW ON IMITREX SINCE 08-02-2018,NOTICES IMPROVEMENT    Multiple food allergies    Muscle pain    Numbness and tingling in both hands    Pre-diabetes    Shortness of breath    was due to a medication side effect, now off medication , SOB resolved    Sleep apnea    Stress    Swelling of both lower extremities  PAST SURGICAL HISTORY: Past Surgical History:  Procedure Laterality Date   ELBOW LIGAMENT RECONSTRUCTION Right 08/21/2018   Procedure: LATERAL COLLATERAL LIGAMENT REPAIR;  Surgeon: Hiram Gash, MD;  Location: [facility];  Service: Orthopedics;  Laterality: Right;   FINGER SURGERY Left     index finger   KNEE SURGERY Left    LIVER BIOPSY N/A 05/15/2014   Procedure: LIVER BIOPSY;  Surgeon: Inda Castle, MD;  Location: [facility];  Service: Endoscopy;  Laterality: N/A;  ultrasound to mark the liver   TENNIS ELBOW RELEASE/NIRSCHEL PROCEDURE Right 08/21/2018   Procedure: TENNIS ELBOW RELEASE/NIRSCHEL PROCEDURE;  Surgeon: Hiram Gash, MD;  Location: [facility];  Service: Orthopedics;  Laterality: Right;   TONSILLECTOMY AND ADENOIDECTOMY  1978   TYMPANOSTOMY TUBE PLACEMENT     ULNAR NERVE TRANSPOSITION Right 08/21/2018   Procedure: ULNAR NERVE DECOMPRESSION/TRANSPOSITION;  Surgeon: Hiram Gash, MD;  Location: [facility];  Service: Orthopedics;  Laterality: Right;   wrist cyst Left     SOCIAL HISTORY: Social History   Tobacco Use   Smoking status: Never Smoker   Smokeless tobacco: Never Used  Substance Use Topics   Alcohol use: No    Alcohol/week: 0.0 standard drinks   Drug use: No    FAMILY HISTORY: Family History  Problem Relation Age of Onset   Diabetes Mother    Liver disease Mother        ESLD- unknown etiology   Obesity Mother    Diabetes Maternal  Grandmother    Diabetes Paternal Grandmother    Breast cancer Paternal Grandmother    Stroke Maternal Grandfather    Hyperlipidemia Father     ROS: Review of Systems  Constitutional: Positive for weight loss.  Cardiovascular: Negative for chest pain.    PHYSICAL EXAM: Blood pressure 129/81, pulse 63, temperature 98.4 F (36.9 C), temperature source Oral, height 5' 8"  (1.727 m), weight (!) 331 lb (150.1 kg), SpO2 98 %. Body mass index is 50.33 kg/m. Physical Exam Vitals signs reviewed.  Constitutional:      Appearance: Normal appearance. She is well-developed. She is obese.  Cardiovascular:     Rate and Rhythm: Normal rate.  Pulmonary:     Effort: Pulmonary effort is normal.  Musculoskeletal: Normal range of motion.  Skin:    General: Skin is warm and dry.  Neurological:     Mental Status: She is alert and oriented to person, place, and time.  Psychiatric:        Mood and Affect: Mood normal.        Behavior: Behavior normal.     RECENT LABS AND TESTS: BMET    Component Value Date/Time   NA 138 01/16/2019 1759   NA 139 12/24/2018 0818   K 3.6 01/16/2019 1759   CL 99 01/16/2019 1759   CO2 30 01/16/2019 1759   GLUCOSE 110 (H) 01/16/2019 1759   BUN 22 (H) 01/16/2019 1759   BUN 20 12/24/2018 0818   CREATININE 1.16 (H) 01/16/2019 1759   CREATININE 0.85 09/08/2015 1514   CALCIUM 10.4 (H) 01/16/2019 1759   GFRNONAA 57 (L) 01/16/2019 1759   GFRNONAA 85 09/08/2015 1514   GFRAA >60 01/16/2019 1759   GFRAA >89 09/08/2015 1514   Lab Results  Component Value Date   HGBA1C 5.6 12/24/2018   HGBA1C 5.5 07/25/2018   HGBA1C 5.5 05/01/2018   HGBA1C 5.6 12/13/2017   HGBA1C 5.0 09/05/2017   Lab Results  Component Value Date   INSULIN 46.3 (H) 12/24/2018  INSULIN 39.2 (H) 07/25/2018   INSULIN 30.4 (H) 05/01/2018   INSULIN 39.0 (H) 12/13/2017   INSULIN 24.3 09/05/2017   CBC    Component Value Date/Time   WBC 7.7 01/16/2019 1759   RBC 4.35 01/16/2019 1759    HGB 13.9 01/16/2019 1759   HGB 14.6 07/26/2018 1549   HCT 41.1 01/16/2019 1759   HCT 41.6 07/26/2018 1549   PLT 221 01/16/2019 1759   PLT 254 07/26/2018 1549   MCV 94.5 01/16/2019 1759   MCV 90 07/26/2018 1549   MCH 32.0 01/16/2019 1759   MCHC 33.8 01/16/2019 1759   RDW 12.3 01/16/2019 1759   RDW 12.8 07/26/2018 1549   LYMPHSABS 2.8 05/23/2017 1249   MONOABS 0.4 10/27/2013 1604   EOSABS 0.3 05/23/2017 1249   BASOSABS 0.0 05/23/2017 1249   Iron/TIBC/Ferritin/ %Sat    Component Value Date/Time   IRON 95 06/27/2013 1645   TIBC 371 06/27/2013 1645   FERRITIN 401 (H) 06/27/2013 1645   IRONPCTSAT 26 06/27/2013 1645   Lipid Panel     Component Value Date/Time   CHOL 227 (H) 12/24/2018 0818   TRIG 180 (H) 12/24/2018 0818   HDL 60 12/24/2018 0818   CHOLHDL 7.0 (H) 07/26/2018 1549   CHOLHDL 5.0 06/06/2013 1654   VLDL 27 06/06/2013 1654   LDLCALC 135 (H) 12/24/2018 0818   Hepatic Function Panel     Component Value Date/Time   PROT 7.2 01/16/2019 1759   PROT 6.2 12/24/2018 0818   ALBUMIN 4.4 01/16/2019 1759   ALBUMIN 4.1 12/24/2018 0818   AST 62 (H) 01/16/2019 1759   ALT 73 (H) 01/16/2019 1759   ALKPHOS 138 (H) 01/16/2019 1759   BILITOT 1.7 (H) 01/16/2019 1759   BILITOT 0.9 12/24/2018 0818   BILIDIR 0.2 04/07/2015 1349   IBILI 1.0 04/07/2015 1349      Component Value Date/Time   TSH 3.950 12/24/2018 0818   TSH 6.170 (H) 06/27/2018 1139   TSH 5.010 (H) 05/01/2018 1031     Ref. Range 12/24/2018 08:18  Vitamin D, 25-Hydroxy Latest Ref Range: 30.0 - 100.0 ng/mL 44.8    OBESITY BEHAVIORAL INTERVENTION VISIT  Today's visit was # 32  Starting weight: 338 lbs Starting date: 05/23/2017 Today's weight : 331 lbs Today's date: 01/29/2019 Total lbs lost to date: 7    01/29/2019  Height 5' 8"  (1.727 m)  Weight 331 lb (150.1 kg) (A)  BMI (Calculated) 50.34  BLOOD PRESSURE - SYSTOLIC 315  BLOOD PRESSURE - DIASTOLIC 81   Body Fat % 40.0 %    ASK: We discussed the  diagnosis of obesity with [name] today and Roselynne agreed to give [place] permission to discuss obesity behavioral modification therapy today.  ASSESS: Keary has the diagnosis of obesity and her BMI today is 50.34 Kyndel is in the action stage of change   ADVISE: Trenae was educated on the multiple health risks of obesity as well as the benefit of weight loss to improve her health. She was advised of the need for long term treatment and the importance of lifestyle modifications to improve her current health and to decrease her risk of future health problems.  AGREE: Multiple dietary modification options and treatment options were discussed and  Kenadie agreed to follow the recommendations documented in the above note.  ARRANGE: Loraina was educated on the importance of frequent visits to treat obesity as outlined per CMS and USPSTF guidelines and agreed to schedule her next follow up appointment today.  I,  Doreene Nest, am acting as transcriptionist for Abby Potash, PA-C I, Abby Potash, PA-C have reviewed above note and agree with its content

## 2019-02-12 ENCOUNTER — Telehealth: Payer: Self-pay

## 2019-02-12 NOTE — Telephone Encounter (Signed)
Prior Auth was started for Rx Livalo 49m tablets (( [id number])

## 2019-02-13 ENCOUNTER — Ambulatory Visit: Payer: BC Managed Care – PPO | Admitting: Neurology

## 2019-02-13 ENCOUNTER — Other Ambulatory Visit: Payer: Self-pay

## 2019-02-13 ENCOUNTER — Encounter: Payer: Self-pay | Admitting: Neurology

## 2019-02-13 VITALS — BP 119/73 | HR 56 | Temp 96.9°F | Ht 68.0 in | Wt 340.6 lb

## 2019-02-13 DIAGNOSIS — R519 Headache, unspecified: Secondary | ICD-10-CM

## 2019-02-13 DIAGNOSIS — Z6841 Body Mass Index (BMI) 40.0 and over, adult: Secondary | ICD-10-CM

## 2019-02-13 DIAGNOSIS — R42 Dizziness and giddiness: Secondary | ICD-10-CM

## 2019-02-13 DIAGNOSIS — G4733 Obstructive sleep apnea (adult) (pediatric): Secondary | ICD-10-CM

## 2019-02-13 MED ORDER — TOPIRAMATE 50 MG PO TABS: ORAL_TABLET | ORAL | 5 refills | Status: DC

## 2019-02-13 NOTE — Progress Notes (Signed)
Subjective:    Patient ID: Melanie [name] is a 43 y.o. female.  HPI     Interim history:   Melanie [name] is a 43 year old right-handed woman with an underlying medical prediabetes, hyperlipidemia, reflux disease, depression, anxiety, allergies, anemia, lower extremity edema, OSA and morbid obesity with a BMI of over 27, who presents for follow-up consultation of her recurrent headaches.  The patient is unaccompanied today.  I saw her in a virtual visit on 08/15/2018, at which time she reported a 2 to 51-monthhistory of nearly daily headaches.  She had been using an oral appliance for her obstructive sleep apnea.  She had a prior diagnosis of moderate obstructive sleep apnea.  Her primary care physician had ordered a brain MRI.  She was advised to try Ubrelvy.  I suggested we proceed with a sleep study to evaluate her sleep apnea while she is on treatment with her oral appliance.  She had a sleep study on 10/03/2018 with her oral appliance in place, and she had residual sleep disordered breathing with a total AHI of 13.5/h, REM AHI of 41.9/h, supine AHI of 13.5/h and O2 nadir of 78%.  She had moderate PLM's without significant arousals.  She was advised to proceed with a formal titration study for sleep apnea treatment.  She had a CPAP titration study on 11/08/2018.  She had eventual resolution of her sleep disordered breathing with a BiPAP pressure of 18/14.  She was fitted with a full facemask.  Sleep efficiency was 84.4%, she had normal REM sleep during that study.  She was advised to start home BiPAP therapy.  She had a brain MRI with and without contrast on 09/25/2018 and I reviewed the results: IMPRESSION: Partially empty sella turcica. This is a common incidental finding, although can be associated with idiopathic intracranial hypertension.   Otherwise unremarkable brain MRI.   Today, 02/13/2019: I reviewed her BiPAP compliance data from 01/13/2019 through 02/11/2019 which is a total of 30 days, during  which time she used her BiPAP every night with percent use days greater than 4 hours at 97%, indicating excellent compliance with an average usage of 7 hours and 22 minutes, residual AHI borderline at 6.9/h, leak on the low side with a 95th percentile at 4.6 L/min on a pressure of 18/14.  Her residual events are mostly obstructive in nature, central apnea index was 2.2/h.  She reports feeling better in terms of her daytime somnolence and sleep quality.  Headache severity is also better.  Nevertheless, she still has some intermittent head pressure, sometimes feels she has blurry vision but had a full eye examination about 3 to 4 months ago and got new prescription eyeglasses.  She has no visual field disturbance, sometimes she feels lightheaded especially if she stands up after sitting for prolonged period of time.  She has had some spinning sensation such as vertigo, no hearing loss or tinnitus.  She tries to hydrate well.  She drinks caffeine in the form of Coke zero, 1 or 2 cans/day on average.  She goes to weight management.  The patient's allergies, current medications, family history, past medical history, past social history, past surgical history and problem list were reviewed and updated as appropriate.   Previously:     She reports that for the past 2 or 3 months she has had recurrent nearly daily headaches, they are often behind her eyes, right side more than left, sometimes viselike, sometimes stabbing or throbbing, sometimes in the top of her  head.  She has some associated photophobia, rare nausea or vomiting, today she had some nausea with it.  She has no prior diagnosis of migraine headaches.  She was given a prescription for Imitrex by Dr. Nolon Rod in in May 2020 when she had a virtual visit with her.  The patient reports that she has taken it almost daily.  She is strongly discouraged to take it every day.  She is advised that it is for as needed use only.  She states that she got a refill  just yesterday.  She has a prior history of morning headaches.   I have seen her over 3 years ago for evaluation of her obstructive sleep apnea concerns.  She had a split-night sleep study in March 2017 which indicated moderate to severe obstructive sleep apnea.  She decided to proceed with treatment with an oral appliance through her dentist.  She follows with medical weight management for her obesity. Dr. Nolon Rod ordered a brain MRI, this is pending for later this month.  She has surgery on her right ulnar nerve scheduled for 08/21/2018.  She has a torn tendon.  She reports that she has been using her oral appliance, she has received the oral appliance through her dentist practice, [employer].  She has not had a follow-up sleep study.  She has had interim weight gain, at the time of her sleep study on 06/08/2015 her weight was documented at 296 pounds, she currently reports her weight at 325 pounds.  Her weight has been plateaued lately.  She is due for an eye examination this summer.  She feels that she needs a new prescription.  She has prescription eyeglasses.  She has occasional blurry vision.  She feels that the Imitrex 50 mg strength has been somewhat helpful.  She tries to hydrate well with water, estimates that she drinks about 3-4 bottles of water per day.  She has limited caffeine intake, typically 1 diet Coke per day on average.  She has had intermittent lightheadedness, has a history of vertigo but most recently has felt more lightheaded than vertiginous.  She has had some intermittent tremors affecting both hands, left more than right, it comes and goes, mostly when she holds something.   06/01/2015: Melanie [name] is a 43 year old right-handed woman with an underlying medical history of allergies, anxiety, depression, abnormal LFTs, and morbid obesity, who reports snoring and excessive daytime somnolence, as well as witnessed apneic breathing pauses and morning headaches, difficulty  initiating and maintaining sleep at times. She had a tonsillectomy and adenoidectomy as a child. She has a longer standing history of difficulty initiating and maintaining sleep. She feels it is hard for her to shut down at night. She sometimes watches TV in the family room until she feels sleepy enough to go to bed. Sometimes and that she reads or uses her cell phone. She does not typically watch TV in bed. Her husband sleeps with a CPAP but it does not disturb her. She has woken herself up with her snoring and a sense of gasping for air. She has had occasional morning headaches and nocturia is about once per night. She denies restless leg symptoms. Sometimes one of her cats jumped onto the bed and disturbs her sleep. She has tried over-the-counter sleep aids including p.m. type medications and melatonin and while these medications can work for a couple of days, they also keep her quite groggy during the day so she does not usually take any of these  medications during the workweek. She has also been tried in the past on prescription sleeping pills including Ambien and Lunesta and perhaps Remeron and again she did not have any sustained results with any of the prescription medications and also suffered from daytime grogginess. She suspects that there is family history of sleep apnea, at least in her father but both parents snored quite loudly she recalls that she was growing up. Bedtime is around 10 PM and he can take her long to fall asleep. Rise time is around 5:30, she does not typically wake up rested and often feels tired first thing in the morning. She does not drink caffeine on a regular basis, occasional cola. She is a nonsmoker and does not drink alcohol. She lives at home with her husband and 3 foster children ages 99, 50 and 82, she is in the process of adopting her foster children. She works as a Pharmacist, hospital at [employer]. Her Epworth sleepiness score is 14 out of 24 today, her fatigue score is 45 out of 63.      Her Past Medical History Is Significant For: Past Medical History:  Diagnosis Date  . Allergy   . Anemia   . Anxiety   . Back pain   . Constipation   . Depression   . Fatty liver   . GERD (gastroesophageal reflux disease)   . Hyperlipidemia   . Infertility, female   . Joint pain   . Kidney problem   . 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 Migraines    LIGHT SENSITIVITY , NOW ON IMITREX SINCE 08-02-2018,NOTICES IMPROVEMENT   . Multiple food allergies   . Muscle pain   . Numbness and tingling in both hands   . Pre-diabetes   . Shortness of breath    was due to a medication side effect, now off medication , SOB resolved   . Sleep apnea   . Stress   . Swelling of both lower extremities     Her Past Surgical History Is Significant For: Past Surgical History:  Procedure Laterality Date  . ELBOW LIGAMENT RECONSTRUCTION Right 08/21/2018   Procedure: LATERAL COLLATERAL LIGAMENT REPAIR;  Surgeon: Hiram Gash, MD;  Location: [facility];  Service: Orthopedics;  Laterality: Right;  . FINGER SURGERY Left     index finger  . KNEE SURGERY Left   . LIVER BIOPSY N/A 05/15/2014   Procedure: LIVER BIOPSY;  Surgeon: Inda Castle, MD;  Location: [facility];  Service: Endoscopy;  Laterality: N/A;  ultrasound to mark the liver  . TENNIS ELBOW RELEASE/NIRSCHEL PROCEDURE Right 08/21/2018   Procedure: TENNIS ELBOW RELEASE/NIRSCHEL PROCEDURE;  Surgeon: Hiram Gash, MD;  Location: [facility];  Service: Orthopedics;  Laterality: Right;  . TONSILLECTOMY AND ADENOIDECTOMY  1978  . TYMPANOSTOMY TUBE PLACEMENT    . ULNAR NERVE TRANSPOSITION Right 08/21/2018   Procedure: ULNAR NERVE DECOMPRESSION/TRANSPOSITION;  Surgeon: Hiram Gash, MD;  Location: [facility];  Service: Orthopedics;  Laterality: Right;  . wrist cyst Left     Her Family History Is Significant For: Family History  Problem Relation Age of Onset  . Diabetes Mother   . Liver disease Mother        ESLD- unknown  etiology  . Obesity Mother   . Diabetes Maternal Grandmother   . Diabetes Paternal Grandmother   . Breast cancer Paternal Grandmother   . Stroke Maternal Grandfather   . Hyperlipidemia Father     Her Social History  Is Significant For: Social History   Socioeconomic History  . Marital status: Married    Spouse name: [name]  . Number of children: 5  . Years of education: in [employer] program  . Highest education level: Bachelor's degree (e.g., BA, AB, BS)  Occupational History  . Occupation: Product manager: [employer]    Comment: ESL  Social Needs  . Financial resource strain: Not on file  . Food insecurity    Worry: Not on file    Inability: Not on file  . Transportation needs    Medical: Not on file    Non-medical: Not on file  Tobacco Use  . Smoking status: Never Smoker  . Smokeless tobacco: Never Used  Substance and Sexual Activity  . Alcohol use: No    Alcohol/week: 0.0 standard drinks  . Drug use: No  . Sexual activity: Yes    Partners: Male    Birth control/protection: Post-menopausal  Lifestyle  . Physical activity    Days per week: Not on file    Minutes per session: Not on file  . Stress: Not on file  Relationships  . Social Herbalist on phone: Not on file    Gets together: Not on file    Attends religious service: Not on file    Active member of club or organization: Not on file    Attends meetings of clubs or organizations: Not on file    Relationship status: Not on file  Other Topics Concern  . Not on file  Social History Narrative   Lives with her husband and their 5 adpoted children.    Adopted 2 sets of siblings, all with psychiatric diagnoses (ADHD, Asperger's, ODD, PTSD, depression, bipolar disorder).   Her husband has Asperger's, and works in disability services.   Occasionally drinks coke or pepsi     Her Allergies Are:  Allergies  Allergen Reactions  . Welchol [Colesevelam] Shortness Of Breath  . Lidocaine  Other (See Comments)    Flu like sx's  . Relafen [Nabumetone] Other (See Comments)    migraine  . Celebrex [Celecoxib] Rash  . Sulfa Antibiotics Rash  :   Her Current Medications Are:  Outpatient Encounter Medications as of 02/13/2019  Medication Sig  . Alirocumab (PRALUENT) 150 MG/ML SOAJ Inject 1 Dose into the skin every 14 (fourteen) days.  Marland Kitchen buPROPion (WELLBUTRIN SR) 150 MG 12 hr tablet Take 1 tablet (150 mg total) by mouth 2 (two) times daily.  . busPIRone (BUSPAR) 15 MG tablet TAKE ONE TABLET BY MOUTH TWICE A DAY OR AS DIRECTED. (Patient taking differently: Take 15 mg by mouth 2 (two) times daily. TAKE ONE TABLET BY MOUTH TWICE A DAY OR AS DIRECTED.)  . cetirizine (ZYRTEC) 10 MG tablet Take 10 mg by mouth at bedtime.   . chlorthalidone (HYGROTON) 25 MG tablet Take 1 tablet (25 mg total) by mouth daily.  . citalopram (CELEXA) 40 MG tablet Take 1 tablet by mouth daily.  . fluticasone (FLONASE) 50 MCG/ACT nasal spray Place 2 sprays into both nostrils daily. (Patient taking differently: Place 2 sprays into both nostrils daily as needed for allergies. )  . levothyroxine (SYNTHROID) 88 MCG tablet TAKE ONE TABLET BY MOUTH DAILY  . LORazepam (ATIVAN) 1 MG tablet Take 1 tablet (1 mg total) by mouth 2 (two) times daily as needed for anxiety.  . meclizine (ANTIVERT) 25 MG tablet TAKE ONE TABLET BY MOUTH THREE TIMES A DAY AS NEEDED FOR  DIZZINESS (Patient taking differently: Take 25 mg by mouth 3 (three) times daily as needed for dizziness. )  . meloxicam (MOBIC) 15 MG tablet Take 15 mg by mouth daily as needed for pain.   . metFORMIN (GLUCOPHAGE) 500 MG tablet Take 1 tablet (500 mg total) by mouth 2 (two) times daily with a meal.  . metoprolol tartrate (LOPRESSOR) 100 MG tablet Take ONE tablet TWO HOURS prior to test.  . montelukast (SINGULAIR) 10 MG tablet Take 1 tablet (10 mg total) by mouth at bedtime.  . Multiple Vitamin (MULTIVITAMIN) tablet Take 1 tablet by mouth daily.  . Omega-3 Fatty  Acids (FISH OIL PO) Take 1 capsule by mouth 2 (two) times a day.   Marland Kitchen omeprazole (PRILOSEC) 20 MG capsule Take 1 capsule (20 mg total) by mouth daily.  . ondansetron (ZOFRAN) 4 MG tablet Take 1 tablet (4 mg total) by mouth every 8 (eight) hours as needed for nausea or vomiting.  . Pitavastatin Calcium (LIVALO) 2 MG TABS Take 1 tablet (2 mg total) by mouth daily.  . SUMAtriptan (IMITREX) 50 MG tablet TAKE ONE TABLET BY MOUTH AT ONSET OF HEADACHE; MAY REPEAT ONE TABLET IN 2 HOURS IF NEEDED. (Patient taking differently: Take 50 mg by mouth See admin instructions. Take 1 tablet at onset of headache; may repeat one tablet in 2 hours if needed.)  . Ubrogepant (UBRELVY) 50 MG TABS Take 50 mg by mouth as needed (may repeat once after 2 hours).  . Vitamin D, Ergocalciferol, (DRISDOL) 1.25 MG (50000 UT) CAPS capsule Take 1 capsule (50,000 Units total) by mouth 2 (two) times a week. Tuesday and Friday  . topiramate (TOPAMAX) 50 MG tablet 1/2 pill each bedtime x 1 week, then 1 pill nightly x 1 week, then 1-1/2 pills nightly x 1 week, then 2 pills nightly thereafter.   No facility-administered encounter medications on file as of 02/13/2019.   :  Review of Systems:  Out of a complete 14 point review of systems, all are reviewed and negative with the exception of these symptoms as listed below: Review of Systems  Neurological:       Rm 2, alone. Initial Bipap f/u. DME [facility]. Feels better, more rested.  Still having dizziness and headaches.      Objective:  Neurological Exam  Physical Exam Physical Examination:   Vitals:   02/13/19 1346  BP: 119/73  Pulse: (!) 56  Temp: (!) 96.9 F (36.1 C)   General Examination: The patient is a very pleasant 43 y.o. female in no acute distress. She appears well-developed and well-nourished and well groomed.   HEENT: Normocephalic, atraumatic, pupils are equal, round and reactive to light, extraocular tracking is good without limitation to gaze excursion or  nystagmus noted. Funduscopic exam is benign, she has corrective eyeglasses.  Tympanic membranes are clear bilaterally.  She has no vertigo symptoms today, no orthostatic lightheadedness. Hearing is grossly intact. Face is symmetric with normal facial animation. Speech is clear with no dysarthria noted. There is no hypophonia. There is no lip, neck/head, jaw or voice tremor. Neck is supple with full range of passive and active motion. There are no carotid bruits on auscultation. Oropharynx exam reveals: mild mouth dryness, adequate dental hygiene. Tongue protrudes centrally and palate elevates symmetrically.    Chest: Clear to auscultation without wheezing, rhonchi or crackles noted.  Heart: S1+S2+0, regular and normal without murmurs, rubs or gallops noted.   Abdomen: Soft, non-tender and non-distended with normal bowel sounds appreciated on auscultation.  Extremities: There is trace pitting edema in the distal lower extremities bilaterally.   Skin: Warm and dry without trophic changes noted.   Musculoskeletal: exam reveals no obvious joint deformities, tenderness or joint swelling or erythema.   Neurologically:  Mental status: The patient is awake, alert and oriented in all 4 spheres. Her immediate and remote memory, attention, language skills and fund of knowledge are appropriate. There is no evidence of aphasia, agnosia, apraxia or anomia. Speech is clear with normal prosody and enunciation. Thought process is linear. Mood is normal and affect is normal.  Cranial nerves II - XII are as described above under HEENT exam.  Motor exam: Normal bulk, strength and tone is noted. There is no tremor, Romberg is negative. Reflexes are 1+ In the upper extremities, trace in the lower extremities.  Toes are downgoing bilaterally. Fine motor skills and coordination: Finger taps and foot taps are normal bilaterally.  Cerebellar testing shows normal finger-to-nose and slight difficulty with heel-to-shin  secondary to body habitus but otherwise no dysmetria or intention tremor.  She has no truncal or gait ataxia. Sensory exam: intact to light touch in the upper and lower extremities.  Gait, station and balance: She stands easily. No veering to one side is noted. No leaning to one side is noted. Posture is age-appropriate and stance is narrow based. Gait shows normal stride length and normal pace. No problems turning are noted. Tandem walk is unremarkable.                Assessment and Plan:  In summary, ROSHUNDA KEIR is a very pleasant 43 year old female with an underlying medical prediabetes, hyperlipidemia, reflux disease, depression, anxiety, allergies, anemia, lower extremity edema, OSA and morbid obesity with a BMI of over 27, who presents for Follow-up consultation of her recurrent headaches.  She has a several month history of recurrent headaches, some of these are migrainous, some of these are pressure-like.  She has had some improvement after optimization of her sleep apnea treatment.  She had interim sleep study testing and has been on BiPAP.  She is compliant with treatment and highly commended for it.  She endorses better sleep quality and better Daytime energy and some improvement in her headache severity.  MRI brain was unremarkable with the exception of partially empty sella.  She had a recent eye examination including a dilated eye exam and visual field testing as per her report. She has tried Ubrelvy, 50 mg strength prn, She tried it once and thought it was helpful.  She is advised to consider daily headache prevention.  I suggested she try Topamax low-dose with gradual increase.  Topamax side effects are discussed with her.  She may notice some weight loss and taste changes.  We talked about the importance of having a regular eye examination.  Should she have any visual field defect or blurry vision she is strongly advised to make a follow-up appointment with her ophthalmologist for a full  recheck.  She can continue with Roselyn Meier as needed.  She is advised to stay well-hydrated and well rested.  As far as her dizziness, she has a lower blood pressure at baseline.  If she stands up, she may have blood pressure drop.  She is encouraged to discuss this with her primary care physician.  She is on a diuretic.  She is encouraged to talk to her PCP about seeing an ENT if she continues to have vertigo symptoms.  She is advised to follow-up in our  clinic in 6 months to see the nurse practitioner.  I answered all her questions today and she was in agreement. I spent 25 minutes in total face-to-face time with the patient, more than 50% of which was spent in counseling and coordination of care, reviewing test results, reviewing medication and discussing or reviewing the diagnosis of HAs, OSA, dizziness, the prognosis and treatment options. Pertinent laboratory and imaging test results that were available during this visit with the patient were reviewed by me and considered in my medical decision making (see chart for details).

## 2019-02-13 NOTE — Patient Instructions (Addendum)
I am glad your sleep quality is better and that your headaches have improved a little bit since starting the BiPAP, you have done a great job using your BiPAP machine, keep up the good work.  Please continue to work on weight loss.  For migraine prevention I suggest you start Topamax 50 mg: Take half a pill each bedtime for one week, then one pill at bedtime daily for one week, then 1-1/2 pills at bedtime daily for one week, then 2 pills at bedtime daily thereafter. Common side effects reported are: Sedation, sleepiness, tingling, change in taste especially with carbonated drinks, and rare side effects are glaucoma, kidney stones, and problems with thinking including word finding difficulties. You can use Ubrelvy as needed. Follow-up in 6 months to see the nurse practitioner.Please talk to your primary care physician about seeing an ENT if you continue to have vertigo.You may also have Intermittent lightheadedness because of blood pressure dropping.  You are taking a water pill on a daily basis and your baseline blood pressure is on the lower side. If you have any interim issues with blurry vision, please make a follow-up appointment with your eye doctor for a full, dilated eye examination as well as a visual field testing.  On my examination today, your eye examination, ear examination and neurological examination are nonfocal thankfully.

## 2019-02-14 ENCOUNTER — Other Ambulatory Visit: Payer: Self-pay | Admitting: Family Medicine

## 2019-02-14 DIAGNOSIS — Z20822 Contact with and (suspected) exposure to covid-19: Secondary | ICD-10-CM

## 2019-02-15 LAB — NOVEL CORONAVIRUS, NAA: SARS-CoV-2, NAA: NOT DETECTED

## 2019-02-21 ENCOUNTER — Other Ambulatory Visit: Payer: Self-pay | Admitting: Family Medicine

## 2019-02-21 ENCOUNTER — Other Ambulatory Visit (INDEPENDENT_AMBULATORY_CARE_PROVIDER_SITE_OTHER): Payer: Self-pay | Admitting: Physician Assistant

## 2019-02-21 ENCOUNTER — Other Ambulatory Visit (INDEPENDENT_AMBULATORY_CARE_PROVIDER_SITE_OTHER): Payer: Self-pay | Admitting: Family Medicine

## 2019-02-21 DIAGNOSIS — E039 Hypothyroidism, unspecified: Secondary | ICD-10-CM

## 2019-02-21 DIAGNOSIS — E559 Vitamin D deficiency, unspecified: Secondary | ICD-10-CM

## 2019-02-21 DIAGNOSIS — R7303 Prediabetes: Secondary | ICD-10-CM

## 2019-02-22 ENCOUNTER — Other Ambulatory Visit: Payer: Self-pay | Admitting: Family Medicine

## 2019-02-22 DIAGNOSIS — F4323 Adjustment disorder with mixed anxiety and depressed mood: Secondary | ICD-10-CM

## 2019-02-22 DIAGNOSIS — E039 Hypothyroidism, unspecified: Secondary | ICD-10-CM

## 2019-02-22 DIAGNOSIS — F418 Other specified anxiety disorders: Secondary | ICD-10-CM

## 2019-02-24 ENCOUNTER — Telehealth (INDEPENDENT_AMBULATORY_CARE_PROVIDER_SITE_OTHER): Payer: BC Managed Care – PPO | Admitting: Internal Medicine

## 2019-02-24 ENCOUNTER — Encounter: Payer: Self-pay | Admitting: Internal Medicine

## 2019-02-24 ENCOUNTER — Telehealth: Payer: Self-pay | Admitting: Internal Medicine

## 2019-02-24 DIAGNOSIS — E782 Mixed hyperlipidemia: Secondary | ICD-10-CM

## 2019-02-24 DIAGNOSIS — E7849 Other hyperlipidemia: Secondary | ICD-10-CM | POA: Diagnosis not present

## 2019-02-24 DIAGNOSIS — K7400 Hepatic fibrosis, unspecified: Secondary | ICD-10-CM | POA: Diagnosis not present

## 2019-02-24 DIAGNOSIS — K7581 Nonalcoholic steatohepatitis (NASH): Secondary | ICD-10-CM

## 2019-02-24 DIAGNOSIS — Z6841 Body Mass Index (BMI) 40.0 and over, adult: Secondary | ICD-10-CM

## 2019-02-24 NOTE — Telephone Encounter (Signed)
Your plan approved HMG-CoA Reductase Inhibitors (FA-PA) criteria covers this drug when you have a contraindication to all the alternatives, or if complete and valid documentation is provided of you trying and having an inadequate treatment response or intolerance to the required number of formulary alternatives. Your use of this drug does not meet the requirement. This is based on the information that was provided.  Formulary alternatives are: atorvastatin, ezetimibe-simvastatin, fluvastatin, lovastatin, pravastatin, rosuvastatin, simvastatin.   Requirement: Trial and failure of 3 or more in a class with at least 3 alternatives, 2 in a class with 2 alternatives, or 1 in a class with only 1 alternative.   If you would still like to request coverage for this prescription drug, you or your provider may submit another prior authorization request, along with the necessary clinical information, to [facility] by mail, phone or fax to:  [facility] Prior Authorization (Commercial) [address] [place] [address] Phone: [phone] Fax: [phone] _______________  Dennis Bast may request an appeal by sending a written request to the address below. To be eligible for an appeal, your request must be in writing and received within 180 days of the date of this letter. Please mail or fax your appeal to:  [facility] / [place], [place] [address] Fax: [phone]

## 2019-02-24 NOTE — Patient Instructions (Signed)
Medication Instructions:  Your physician recommends that you continue on your current medications as directed. Please refer to the Current Medication list given to you today.  START livalo (once approved with insurance) *If you need a refill on your cardiac medications before your next appointment, please call your pharmacy*  Lab Work: FASTING lab work in 3-4 months to check cholesterol & liver function If you have labs (blood work) drawn today and your tests are completely normal, you will receive your results only by: Marland Kitchen MyChart Message (if you have MyChart) OR . A paper copy in the mail If you have any lab test that is abnormal or we need to change your treatment, we will call you to review the results.  Testing/Procedures: NONE  Follow-Up: At [facility], you and your health needs are our priority.  As part of our continuing mission to provide you with exceptional heart care, we have created designated Provider Care Teams.  These Care Teams include your primary Cardiologist (physician) and Advanced Practice Providers (APPs -  Physician Assistants and Nurse Practitioners) who all work together to provide you with the care you need, when you need it.  Your next appointment:   4 month(s)  The format for your next appointment:   Either In Person or Virtual  Provider:   Raliegh Ip Mali Hilty, MD  Other Instructions

## 2019-02-24 NOTE — Progress Notes (Signed)
Virtual Visit via Video Note   This visit type was conducted due to national recommendations for restrictions regarding the COVID-19 Pandemic (e.g. social distancing) in an effort to limit this patient's exposure and mitigate transmission in our community.  Due to her co-morbid illnesses, this patient is at least at moderate risk for complications without adequate follow up.  This format is felt to be most appropriate for this patient at this time.  All issues noted in this document were discussed and addressed.  A limited physical exam was performed with this format.  Please refer to the patient's chart for her consent to telehealth for [facility].   Evaluation Performed: Telephone visit  Date:  02/24/2019   ID:  Melanie [name], DOB 1975-07-14, MRN [id number]  Patient Location:  [address] [place] [address]  Provider location:   [address] [place], [place] [address]  PCP:  Doristine Bosworth, MD  Cardiologist:  Chrystie Nose, MD Electrophysiologist:  None   Chief Complaint: No complaints  History of Present Illness:    Melanie [name] is a 43 y.o. female who presents via audio/video conferencing for a telehealth visit today.  Melanie [name] is seen today for video visit follow-up.  Overall she seems to be doing well.  She underwent CT coronary angiography with a 0 calcium score and right dominant circulation with normal coronaries.  This is an excellent finding, but confusing given her significant dyslipidemia.  She was also sent for April E testing to rule out dysbetalipoproteinemia.  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  This is not associated with increased coronary risk.  Again her last cholesterol numbers were significantly elevated with total cholesterol 398 and LDL of 280.  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  02/24/2019  [name] is  seen today via telephone follow-up.  Overall she is doing quite well.  She is tolerating Praluent without any significant side effects.  Lab testing in October showed total cholesterol has come down from 398-227, triglycerides improved from 304 to 180, HDL 60 and LDL of 135.  Although this is marked improvement, I agree that slightly lower cholesterol target of 100 for LDL would be ideal.  Previously she saw Azalee Course, PA-C, who recommended adding Livalo 2 mg daily to her regimen.  However, prior authorization was not obtained therefore she is not taking the medication.  Finally, repeat liver enzymes recently showed a slight increase to 62 and 73 (AST/ALT), remain below 2 times upper limit normal.  The patient does not have symptoms concerning for COVID-19 infection (fever, chills, cough, or new SHORTNESS OF BREATH).    Prior CV studies:   The following studies were reviewed today:  Lab work Coronary CT  PMHx:  Past Medical History:  Diagnosis Date  . Allergy   . Anemia   . Anxiety   . Back pain   . Constipation   . Depression   . Fatty liver   . GERD (gastroesophageal reflux disease)   . Hyperlipidemia   . Infertility, female   . Joint pain   . Kidney problem   . 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 Migraines    LIGHT SENSITIVITY , NOW ON IMITREX SINCE 08-02-2018,NOTICES IMPROVEMENT   . Multiple food allergies   . Muscle pain   . Numbness and tingling in both  hands   . Pre-diabetes   . Shortness of breath    was due to a medication side effect, now off medication , SOB resolved   . Sleep apnea   . Stress   . Swelling of both lower extremities     Past Surgical History:  Procedure Laterality Date  . ELBOW LIGAMENT RECONSTRUCTION Right 08/21/2018   Procedure: LATERAL COLLATERAL LIGAMENT REPAIR;  Surgeon: Bjorn Pippin, MD;  Location: [facility];  Service: Orthopedics;  Laterality: Right;  . FINGER SURGERY Left     index finger  . KNEE SURGERY Left   .  LIVER BIOPSY N/A 05/15/2014   Procedure: LIVER BIOPSY;  Surgeon: Louis Meckel, MD;  Location: [facility];  Service: Endoscopy;  Laterality: N/A;  ultrasound to mark the liver  . TENNIS ELBOW RELEASE/NIRSCHEL PROCEDURE Right 08/21/2018   Procedure: TENNIS ELBOW RELEASE/NIRSCHEL PROCEDURE;  Surgeon: Bjorn Pippin, MD;  Location: [facility];  Service: Orthopedics;  Laterality: Right;  . TONSILLECTOMY AND ADENOIDECTOMY  1978  . TYMPANOSTOMY TUBE PLACEMENT    . ULNAR NERVE TRANSPOSITION Right 08/21/2018   Procedure: ULNAR NERVE DECOMPRESSION/TRANSPOSITION;  Surgeon: Bjorn Pippin, MD;  Location: [facility];  Service: Orthopedics;  Laterality: Right;  . wrist cyst Left     FAMHx:  Family History  Problem Relation Age of Onset  . Diabetes Mother   . Liver disease Mother        ESLD- unknown etiology  . Obesity Mother   . Diabetes Maternal Grandmother   . Diabetes Paternal Grandmother   . Breast cancer Paternal Grandmother   . Stroke Maternal Grandfather   . Hyperlipidemia Father     SOCHx:   reports that she has never smoked. She has never used smokeless tobacco. She reports that she does not drink alcohol or use drugs.  ALLERGIES:  Allergies  Allergen Reactions  . Welchol [Colesevelam] Shortness Of Breath  . Lidocaine Other (See Comments)    Flu like sx's  . Relafen [Nabumetone] Other (See Comments)    migraine  . Celebrex [Celecoxib] Rash  . Sulfa Antibiotics Rash    MEDS:  Current Meds  Medication Sig  . Alirocumab (PRALUENT) 150 MG/ML SOAJ Inject 1 Dose into the skin every 14 (fourteen) days.  Marland Kitchen buPROPion (WELLBUTRIN SR) 150 MG 12 hr tablet Take 1 tablet (150 mg total) by mouth 2 (two) times daily.  . busPIRone (BUSPAR) 15 MG tablet Take 1 tablet by mouth twice daily or as directed.  . cetirizine (ZYRTEC) 10 MG tablet Take 10 mg by mouth at bedtime.   . chlorthalidone (HYGROTON) 25 MG tablet Take 1 tablet (25 mg total) by mouth daily.  . citalopram (CELEXA) 40 MG tablet Take 1  tablet by mouth daily.  . fluticasone (FLONASE) 50 MCG/ACT nasal spray Place 2 sprays into both nostrils daily. (Patient taking differently: Place 2 sprays into both nostrils daily as needed for allergies. )  . levothyroxine (SYNTHROID) 88 MCG tablet Take 1 tablet by mouth daily.  Marland Kitchen LORazepam (ATIVAN) 1 MG tablet Take 1 tablet (1 mg total) by mouth 2 (two) times daily as needed for anxiety.  . meclizine (ANTIVERT) 25 MG tablet TAKE ONE TABLET BY MOUTH THREE TIMES A DAY AS NEEDED FOR DIZZINESS (Patient taking differently: Take 25 mg by mouth 3 (three) times daily as needed for dizziness. )  . meloxicam (MOBIC) 15 MG tablet Take 15 mg by mouth daily as needed for pain.   . metFORMIN (GLUCOPHAGE) 500 MG tablet Take  1 tablet (500 mg total) by mouth 2 (two) times daily with a meal.  . metoprolol tartrate (LOPRESSOR) 100 MG tablet Take ONE tablet TWO HOURS prior to test.  . montelukast (SINGULAIR) 10 MG tablet Take 1 tablet (10 mg total) by mouth at bedtime.  . Multiple Vitamin (MULTIVITAMIN) tablet Take 1 tablet by mouth daily.  . Omega-3 Fatty Acids (FISH OIL PO) Take 1 capsule by mouth 2 (two) times a day.   Marland Kitchen omeprazole (PRILOSEC) 20 MG capsule Take 1 capsule (20 mg total) by mouth daily.  . ondansetron (ZOFRAN) 4 MG tablet Take 1 tablet (4 mg total) by mouth every 8 (eight) hours as needed for nausea or vomiting.  . Pitavastatin Calcium (LIVALO) 2 MG TABS Take 1 tablet (2 mg total) by mouth daily.  . SUMAtriptan (IMITREX) 50 MG tablet TAKE ONE TABLET BY MOUTH AT ONSET OF HEADACHE; MAY REPEAT ONE TABLET IN 2 HOURS IF NEEDED. (Patient taking differently: Take 50 mg by mouth See admin instructions. Take 1 tablet at onset of headache; may repeat one tablet in 2 hours if needed.)  . topiramate (TOPAMAX) 50 MG tablet 1/2 pill each bedtime x 1 week, then 1 pill nightly x 1 week, then 1-1/2 pills nightly x 1 week, then 2 pills nightly thereafter.  Marland Kitchen Ubrogepant (UBRELVY) 50 MG TABS Take 50 mg by mouth as  needed (may repeat once after 2 hours).  . Vitamin D, Ergocalciferol, (DRISDOL) 1.25 MG (50000 UT) CAPS capsule Take 1 capsule (50,000 Units total) by mouth 2 (two) times a week. Tuesday and Friday     ROS: Pertinent items noted in HPI and remainder of comprehensive ROS otherwise negative.  Labs/Other Tests and Data Reviewed:    Recent Labs: 12/24/2018: TSH 3.950 01/16/2019: ALT 73; BUN 22; Creatinine, Ser 1.16; Hemoglobin 13.9; Platelets 221; Potassium 3.6; Sodium 138   Recent Lipid Panel Lab Results  Component Value Date/Time   CHOL 227 (H) 12/24/2018 08:18 AM   TRIG 180 (H) 12/24/2018 08:18 AM   HDL 60 12/24/2018 08:18 AM   CHOLHDL 7.0 (H) 07/26/2018 03:49 PM   CHOLHDL 5.0 06/06/2013 04:54 PM   LDLCALC 135 (H) 12/24/2018 08:18 AM    Wt Readings from Last 3 Encounters:  02/13/19 (!) 340 lb 9.6 oz (154.5 kg)  01/29/19 (!) 331 lb (150.1 kg)  01/24/19 (!) 325 lb (147.4 kg)     Exam:    Vital Signs:  There were no vitals taken for this visit.   General appearance: alert, no distress and morbidly obese Lungs: No audible wheezing Abdomen: Morbidly obese Extremities: extremities normal, atraumatic, no cyanosis or edema Skin: Skin color, texture, turgor normal. No rashes or lesions  ASSESSMENT & PLAN:    1. Familial hyperlipidemia -Dutch score of 8 2. 0 coronary calcium without obstructive coronary disease by cardiac CT (09/2018) 3. Morbid obesity  Ms. Jeannotte fortunately had no coronary calcium or any obstructive coronary disease but has a significantly elevated lipid profile.  It is hard to understand this however I do believe she has a familial hyperlipidemia.  It also may be related to liver disease.  Her mother had liver failure due to fatty liver disease, but that suggest that it may have actually been due to a dyslipidemia that led to the problem.  Her father also had high triglycerides in the thousands, again is suggesting an inherited disorder.  She has improved  significantly with Repatha and would benefit from additional statin medication.  I agree that Consuelo Pandy is  the least likely to affect her liver enzymes which have minimally increased but are still within acceptable ranges.  Would recommend 2 mg daily.  Will obtain prior authorization for this to target LDL less than 100.  Repeat lipid profile and liver enzymes in 3 to 4 months and follow-up afterwards with me.  COVID-19 Education: The signs and symptoms of COVID-19 were discussed with the patient and how to seek care for testing (follow up with PCP or arrange E-visit).  The importance of social distancing was discussed today.  Patient Risk:   After full review of this patients clinical status, I feel that they are at least moderate risk at this time.  Time:   Today, I have spent 25 minutes with the patient with telehealth technology discussing dyslipidemia, coronary CT findings, weight loss and dietary changes..     Medication Adjustments/Labs and Tests Ordered: Current medicines are reviewed at length with the patient today.  Concerns regarding medicines are outlined above.   Tests Ordered: No orders of the defined types were placed in this encounter.   Medication Changes: No orders of the defined types were placed in this encounter.   Disposition:  in 4 month(s)  Chrystie Nose, MD, [facility], FACP  [place]  [facility]  Medical Director of the Advanced Lipid Disorders &  Cardiovascular Risk Reduction Clinic Diplomate of the American Board of Clinical Lipidology Attending Cardiologist  Direct Dial: [phone]  Fax: [phone]  Website:  [email or web address]  Chrystie Nose, MD  02/24/2019 10:42 AM

## 2019-02-24 NOTE — Addendum Note (Signed)
Addended by: Fidel Levy on: 02/24/2019 10:51 AM   Modules accepted: Orders

## 2019-02-24 NOTE — Telephone Encounter (Signed)
Patient's appointment was scheduled on 02/17/2019 for an office visit on a virtual AM clinic. MyChart message sent on 12/9 to change appt type. LM for patient today to discuss this appointment as well.

## 2019-02-24 NOTE — Telephone Encounter (Signed)
Livalo prior authorization submitted via [email or web address] Key: [id number] - [place] Case ID: [id number]

## 2019-02-25 ENCOUNTER — Ambulatory Visit (INDEPENDENT_AMBULATORY_CARE_PROVIDER_SITE_OTHER): Payer: BC Managed Care – PPO | Admitting: Physician Assistant

## 2019-02-25 NOTE — Telephone Encounter (Signed)
Please send office note and appeal to use Livalo for additional LDL lowering as it is the safest medicine which is least likely to elevated liver enzymes given her known liver disease. I do not want to try other statins and risk worsening her liver disease, only to jump through the hoops to get Livalo approved.  Thanks,  Dr. Lemmie Evens

## 2019-03-03 ENCOUNTER — Other Ambulatory Visit: Payer: Self-pay

## 2019-03-03 ENCOUNTER — Ambulatory Visit (INDEPENDENT_AMBULATORY_CARE_PROVIDER_SITE_OTHER): Payer: BC Managed Care – PPO | Admitting: Family Medicine

## 2019-03-04 ENCOUNTER — Other Ambulatory Visit: Payer: Self-pay

## 2019-03-04 ENCOUNTER — Encounter (INDEPENDENT_AMBULATORY_CARE_PROVIDER_SITE_OTHER): Payer: Self-pay | Admitting: Physician Assistant

## 2019-03-04 ENCOUNTER — Ambulatory Visit (INDEPENDENT_AMBULATORY_CARE_PROVIDER_SITE_OTHER): Payer: BC Managed Care – PPO | Admitting: Physician Assistant

## 2019-03-04 VITALS — BP 109/71 | HR 71 | Temp 97.8°F | Ht 68.0 in | Wt 320.0 lb

## 2019-03-04 DIAGNOSIS — K219 Gastro-esophageal reflux disease without esophagitis: Secondary | ICD-10-CM | POA: Diagnosis not present

## 2019-03-04 DIAGNOSIS — Z9189 Other specified personal risk factors, not elsewhere classified: Secondary | ICD-10-CM

## 2019-03-04 DIAGNOSIS — E559 Vitamin D deficiency, unspecified: Secondary | ICD-10-CM | POA: Diagnosis not present

## 2019-03-04 DIAGNOSIS — Z6841 Body Mass Index (BMI) 40.0 and over, adult: Secondary | ICD-10-CM

## 2019-03-04 MED ORDER — VITAMIN D (ERGOCALCIFEROL) 1.25 MG (50000 UNIT) PO CAPS: 50000.0000 [IU] | ORAL_CAPSULE | ORAL | 0 refills | Status: DC

## 2019-03-04 MED ORDER — OMEPRAZOLE 20 MG PO CPDR: 20.0000 mg | DELAYED_RELEASE_CAPSULE | Freq: Every day | ORAL | 0 refills | Status: DC

## 2019-03-04 NOTE — Telephone Encounter (Signed)
Appeals letter for livalo composed. Will fax letters, office note, labs, etc upon return to office.   Patient updated via MyChart message

## 2019-03-05 NOTE — Progress Notes (Signed)
Office: [phone]  /  Fax: [phone]   HPI:  Chief Complaint: OBESITY Melanie [name] is here to discuss her progress with her obesity treatment plan. She is on the Pescatarian eating plan +100 calories and states she is following her eating plan approximately 50 % of the time. She states she is exercising 0 minutes 0 times per week.  Melanie [name] states that she started Topamax from her neurologist for migraines and it has decreased her appetite. She is trying to get all of her protein in.  GERD (gastroesophageal reflux disease) Melanie [name] has a diagnosis of gastroesophageal reflux disease and she is on Prilosec. She has no symptoms currently. She has no black stools or bloody stools.  Vitamin D deficiency Melanie [name] has a diagnosis of vitamin D deficiency. Melanie [name] is on vitamin D and she denies nausea, vomiting or muscle weakness. She is taking a multi-vitamin as well.  At risk for osteopenia and osteoporosis Melanie [name] is at higher risk of osteopenia and osteoporosis due to Vitamin D deficiency.   Today's visit was # 36 Starting weight: 338 lbs Starting date: 05/23/17 Today's weight : 320 lbs Today's date: 03/04/2019 Total lbs lost to date: 18 Total lbs lost since last in-office visit: 11  ASSESSMENT AND PLAN:  Gastroesophageal reflux disease, unspecified whether esophagitis present - Plan: omeprazole (PRILOSEC) 20 MG capsule  Vitamin D deficiency - Plan: Vitamin D, Ergocalciferol, (DRISDOL) 1.25 MG (50000 UT) CAPS capsule  At risk for osteoporosis  Class 3 severe obesity with serious comorbidity and body mass index (BMI) of 45.0 to 49.9 in adult, unspecified obesity type ([place])  PLAN:  GERD (gastroesophageal reflux disease) Melanie [name] agrees to continue Prilosec 20 mg daily #90 with no refills and follow up as directed.  Vitamin D Deficiency Low vitamin D level contributes to fatigue and are associated with obesity, breast, and colon cancer. Melanie [name] agrees to continue to take prescription Vit D @50 ,000 IU every  week #4 with no refills and she will follow up for routine testing of vitamin D, at least 2-3 times per year to avoid over-replacement. Melanie [name] agrees to follow up with our clinic in 4 weeks.  At risk for osteopenia and osteoporosis Melanie [name] was given extended  (15 minutes) osteoporosis prevention counseling today. Melanie [name] is at risk for osteopenia and osteoporosis due to her Vitamin D deficiency. She was encouraged to take her Vitamin D and follow her higher calcium diet and increase strengthening exercise to help strengthen her bones and decrease her risk of osteopenia and osteoporosis.  Obesity Melanie [name] is currently in the action stage of change. As such, her goal is to continue with weight loss efforts She has agreed to follow the Pescatarian eating plan +100 calories Melanie [name] has been instructed to work up to a goal of 150 minutes of combined cardio and strengthening exercise per week for weight loss and overall health benefits. We discussed the following Behavioral Modification Strategies today: keeping healthy foods in the home and work on meal planning and easy cooking plans.Melanie [name] has agreed to follow-up with our clinic in 4 weeks. She was informed of the importance of frequent follow-up visits to maximize her success with intensive lifestyle modifications for her multiple health conditions.  ALLERGIES: Allergies  Allergen Reactions  . Welchol [Colesevelam] Shortness Of Breath  . Lidocaine Other (See Comments)    Flu like sx's  . Relafen [Nabumetone] Other (See Comments)    migraine  . Celebrex [Celecoxib] Rash  . Sulfa Antibiotics Rash    MEDICATIONS: Current Outpatient Medications  on File Prior to Visit  Medication Sig Dispense Refill  . Alirocumab (PRALUENT) 150 MG/ML SOAJ Inject 1 Dose into the skin every 14 (fourteen) days. 2 pen 11  . buPROPion (WELLBUTRIN SR) 150 MG 12 hr tablet Take 1 tablet (150 mg total) by mouth 2 (two) times daily. 180 tablet 0  . busPIRone (BUSPAR) 15 MG tablet  Take 1 tablet by mouth twice daily or as directed. 180 tablet 0  . cetirizine (ZYRTEC) 10 MG tablet Take 10 mg by mouth at bedtime.     . chlorthalidone (HYGROTON) 25 MG tablet Take 1 tablet (25 mg total) by mouth daily. 90 tablet 0  . citalopram (CELEXA) 40 MG tablet Take 1 tablet by mouth daily. 90 tablet 1  . fluticasone (FLONASE) 50 MCG/ACT nasal spray Place 2 sprays into both nostrils daily. (Patient taking differently: Place 2 sprays into both nostrils daily as needed for allergies. ) 48 g 3  . levothyroxine (SYNTHROID) 88 MCG tablet Take 1 tablet by mouth daily. 30 tablet 0  . LORazepam (ATIVAN) 1 MG tablet Take 1 tablet (1 mg total) by mouth 2 (two) times daily as needed for anxiety. 20 tablet 0  . meclizine (ANTIVERT) 25 MG tablet TAKE ONE TABLET BY MOUTH THREE TIMES A DAY AS NEEDED FOR DIZZINESS (Patient taking differently: Take 25 mg by mouth 3 (three) times daily as needed for dizziness. ) 30 tablet 0  . meloxicam (MOBIC) 15 MG tablet Take 15 mg by mouth daily as needed for pain.     . metFORMIN (GLUCOPHAGE) 500 MG tablet Take 1 tablet (500 mg total) by mouth 2 (two) times daily with a meal. 180 tablet 0  . montelukast (SINGULAIR) 10 MG tablet Take 1 tablet (10 mg total) by mouth at bedtime. 30 tablet 11  . Multiple Vitamin (MULTIVITAMIN) tablet Take 1 tablet by mouth daily.    . Omega-3 Fatty Acids (FISH OIL PO) Take 1 capsule by mouth 2 (two) times a day.     . ondansetron (ZOFRAN) 4 MG tablet Take 1 tablet (4 mg total) by mouth every 8 (eight) hours as needed for nausea or vomiting. 12 tablet 0  . Pitavastatin Calcium (LIVALO) 2 MG TABS Take 1 tablet (2 mg total) by mouth daily. 30 tablet 5  . SUMAtriptan (IMITREX) 50 MG tablet TAKE ONE TABLET BY MOUTH AT ONSET OF HEADACHE; MAY REPEAT ONE TABLET IN 2 HOURS IF NEEDED. (Patient taking differently: Take 50 mg by mouth See admin instructions. Take 1 tablet at onset of headache; may repeat one tablet in 2 hours if needed.) 10 tablet 0  .  topiramate (TOPAMAX) 50 MG tablet 1/2 pill each bedtime x 1 week, then 1 pill nightly x 1 week, then 1-1/2 pills nightly x 1 week, then 2 pills nightly thereafter. 60 tablet 5  . Ubrogepant (UBRELVY) 50 MG TABS Take 50 mg by mouth as needed (may repeat once after 2 hours). 20 tablet 3   No current facility-administered medications on file prior to visit.    PAST MEDICAL HISTORY: Past Medical History:  Diagnosis Date  . Allergy   . Anemia   . Anxiety   . Back pain   . Constipation   . Depression   . Fatty liver   . GERD (gastroesophageal reflux disease)   . Hyperlipidemia   . Infertility, female   . Joint pain   . Kidney problem   . 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  Migraines    LIGHT SENSITIVITY , NOW ON IMITREX SINCE 08-02-2018,NOTICES IMPROVEMENT   . Multiple food allergies   . Muscle pain   . Numbness and tingling in both hands   . Pre-diabetes   . Shortness of breath    was due to a medication side effect, now off medication , SOB resolved   . Sleep apnea   . Stress   . Swelling of both lower extremities     PAST SURGICAL HISTORY: Past Surgical History:  Procedure Laterality Date  . ELBOW LIGAMENT RECONSTRUCTION Right 08/21/2018   Procedure: LATERAL COLLATERAL LIGAMENT REPAIR;  Surgeon: Hiram Gash, MD;  Location: [facility];  Service: Orthopedics;  Laterality: Right;  . FINGER SURGERY Left     index finger  . KNEE SURGERY Left   . LIVER BIOPSY N/A 05/15/2014   Procedure: LIVER BIOPSY;  Surgeon: Inda Castle, MD;  Location: [facility];  Service: Endoscopy;  Laterality: N/A;  ultrasound to mark the liver  . TENNIS ELBOW RELEASE/NIRSCHEL PROCEDURE Right 08/21/2018   Procedure: TENNIS ELBOW RELEASE/NIRSCHEL PROCEDURE;  Surgeon: Hiram Gash, MD;  Location: [facility];  Service: Orthopedics;  Laterality: Right;  . TONSILLECTOMY AND ADENOIDECTOMY  1978  . TYMPANOSTOMY TUBE PLACEMENT    . ULNAR NERVE TRANSPOSITION Right 08/21/2018   Procedure: ULNAR  NERVE DECOMPRESSION/TRANSPOSITION;  Surgeon: Hiram Gash, MD;  Location: [facility];  Service: Orthopedics;  Laterality: Right;  . wrist cyst Left     SOCIAL HISTORY: Social History   Tobacco Use  . Smoking status: Never Smoker  . Smokeless tobacco: Never Used  Substance Use Topics  . Alcohol use: No    Alcohol/week: 0.0 standard drinks  . Drug use: No    FAMILY HISTORY: Family History  Problem Relation Age of Onset  . Diabetes Mother   . Liver disease Mother        ESLD- unknown etiology  . Obesity Mother   . Diabetes Maternal Grandmother   . Diabetes Paternal Grandmother   . Breast cancer Paternal Grandmother   . Stroke Maternal Grandfather   . Hyperlipidemia Father     ROS: Review of Systems  Constitutional: Positive for weight loss.  Gastrointestinal: Negative for blood in stool, heartburn, melena, nausea and vomiting.  Musculoskeletal:       Negative for muscle weakness    PHYSICAL EXAM: Blood pressure 109/71, pulse 71, temperature 97.8 F (36.6 C), temperature source Oral, height 5' 8"  (1.727 m), weight (!) 320 lb (145.2 kg), SpO2 98 %. Body mass index is 48.66 kg/m. Physical Exam Vitals reviewed.  Constitutional:      General: She is not in acute distress.    Appearance: Normal appearance. She is well-developed. She is obese.  Cardiovascular:     Rate and Rhythm: Normal rate.  Pulmonary:     Effort: Pulmonary effort is normal.  Musculoskeletal:        General: Normal range of motion.  Skin:    General: Skin is warm and dry.  Neurological:     Mental Status: She is alert and oriented to person, place, and time.  Psychiatric:        Mood and Affect: Mood normal.        Behavior: Behavior normal.     RECENT LABS AND TESTS: BMET    Component Value Date/Time   NA 138 01/16/2019 1759   NA 139 12/24/2018 0818   K 3.6 01/16/2019 1759   CL 99 01/16/2019 1759   CO2 30  01/16/2019 1759   GLUCOSE 110 (H) 01/16/2019 1759   BUN 22 (H) 01/16/2019 1759    BUN 20 12/24/2018 0818   CREATININE 1.16 (H) 01/16/2019 1759   CREATININE 0.85 09/08/2015 1514   CALCIUM 10.4 (H) 01/16/2019 1759   GFRNONAA 57 (L) 01/16/2019 1759   GFRNONAA 85 09/08/2015 1514   GFRAA >60 01/16/2019 1759   GFRAA >89 09/08/2015 1514   Lab Results  Component Value Date   HGBA1C 5.6 12/24/2018   HGBA1C 5.5 07/25/2018   HGBA1C 5.5 05/01/2018   HGBA1C 5.6 12/13/2017   HGBA1C 5.0 09/05/2017   Lab Results  Component Value Date   INSULIN 46.3 (H) 12/24/2018   INSULIN 39.2 (H) 07/25/2018   INSULIN 30.4 (H) 05/01/2018   INSULIN 39.0 (H) 12/13/2017   INSULIN 24.3 09/05/2017   CBC    Component Value Date/Time   WBC 7.7 01/16/2019 1759   RBC 4.35 01/16/2019 1759   HGB 13.9 01/16/2019 1759   HGB 14.6 07/26/2018 1549   HCT 41.1 01/16/2019 1759   HCT 41.6 07/26/2018 1549   PLT 221 01/16/2019 1759   PLT 254 07/26/2018 1549   MCV 94.5 01/16/2019 1759   MCV 90 07/26/2018 1549   MCH 32.0 01/16/2019 1759   MCHC 33.8 01/16/2019 1759   RDW 12.3 01/16/2019 1759   RDW 12.8 07/26/2018 1549   LYMPHSABS 2.8 05/23/2017 1249   MONOABS 0.4 10/27/2013 1604   EOSABS 0.3 05/23/2017 1249   BASOSABS 0.0 05/23/2017 1249   Iron/TIBC/Ferritin/ %Sat    Component Value Date/Time   IRON 95 06/27/2013 1645   TIBC 371 06/27/2013 1645   FERRITIN 401 (H) 06/27/2013 1645   IRONPCTSAT 26 06/27/2013 1645   Lipid Panel     Component Value Date/Time   CHOL 227 (H) 12/24/2018 0818   TRIG 180 (H) 12/24/2018 0818   HDL 60 12/24/2018 0818   CHOLHDL 7.0 (H) 07/26/2018 1549   CHOLHDL 5.0 06/06/2013 1654   VLDL 27 06/06/2013 1654   LDLCALC 135 (H) 12/24/2018 0818   Hepatic Function Panel     Component Value Date/Time   PROT 7.2 01/16/2019 1759   PROT 6.2 12/24/2018 0818   ALBUMIN 4.4 01/16/2019 1759   ALBUMIN 4.1 12/24/2018 0818   AST 62 (H) 01/16/2019 1759   ALT 73 (H) 01/16/2019 1759   ALKPHOS 138 (H) 01/16/2019 1759   BILITOT 1.7 (H) 01/16/2019 1759   BILITOT 0.9 12/24/2018  0818   BILIDIR 0.2 04/07/2015 1349   IBILI 1.0 04/07/2015 1349      Component Value Date/Time   TSH 3.950 12/24/2018 0818   TSH 6.170 (H) 06/27/2018 1139   TSH 5.010 (H) 05/01/2018 1031     Ref. Range 12/24/2018 08:18  Vitamin D, 25-Hydroxy Latest Ref Range: 30.0 - 100.0 ng/mL 44.8    I, Doreene Nest, am acting as Location manager for [employer], PA-C I, Abby Potash, PA-C have reviewed above note and agree with its content

## 2019-03-11 NOTE — Telephone Encounter (Signed)
Appeals has been faxed.

## 2019-03-21 ENCOUNTER — Telehealth: Payer: Self-pay | Admitting: *Deleted

## 2019-03-21 NOTE — Telephone Encounter (Signed)
PA for livalo started vis cover my meds.

## 2019-03-23 ENCOUNTER — Other Ambulatory Visit (INDEPENDENT_AMBULATORY_CARE_PROVIDER_SITE_OTHER): Payer: Self-pay | Admitting: Family Medicine

## 2019-03-23 DIAGNOSIS — E559 Vitamin D deficiency, unspecified: Secondary | ICD-10-CM

## 2019-03-24 ENCOUNTER — Other Ambulatory Visit (INDEPENDENT_AMBULATORY_CARE_PROVIDER_SITE_OTHER): Payer: Self-pay | Admitting: Physician Assistant

## 2019-03-24 ENCOUNTER — Encounter (INDEPENDENT_AMBULATORY_CARE_PROVIDER_SITE_OTHER): Payer: Self-pay | Admitting: Physician Assistant

## 2019-03-24 ENCOUNTER — Other Ambulatory Visit: Payer: Self-pay | Admitting: Family Medicine

## 2019-03-24 DIAGNOSIS — E039 Hypothyroidism, unspecified: Secondary | ICD-10-CM

## 2019-03-24 DIAGNOSIS — R7303 Prediabetes: Secondary | ICD-10-CM

## 2019-03-24 DIAGNOSIS — F3289 Other specified depressive episodes: Secondary | ICD-10-CM

## 2019-03-24 NOTE — Telephone Encounter (Signed)
Requested medication (s) are due for refill today: yes  Requested medication (s) are on the active medication list: yes  Last refill:  03/23/2019  Future visit scheduled: yes  Notes to clinic:  TSH needs to be rechecked within 3 months after an abnormal result. Refill until TSH is due   Requested Prescriptions  Pending Prescriptions Disp Refills   levothyroxine (SYNTHROID) 88 MCG tablet [Pharmacy Med Name: Levothyroxine Sodium 14mg Tablet] 30 tablet 0    Sig: Take 1 tablet by mouth daily.      Endocrinology:  Hypothyroid Agents Failed - 03/24/2019  6:12 AM      Failed - TSH needs to be rechecked within 3 months after an abnormal result. Refill until TSH is due.      Passed - TSH in normal range and within 360 days    TSH  Date Value Ref Range Status  12/24/2018 3.950 0.450 - 4.500 uIU/mL Final          Passed - Valid encounter within last 12 months    Recent Outpatient Visits           2 months ago Hematuria, unspecified type   [facility]   7 months ago Hypercalcemia   [facility]   8 months ago Malaise and fatigue   [facility]   8 months [facility]   9 months [facility]

## 2019-03-24 NOTE — Telephone Encounter (Signed)
Faxed appeal was denied as a third-party authorization form was not received or was invalid.   Called insurance company to f/up on this as no form was ever received. Was notified that letter of medical necessity needed to be faxed to [phone]. Explained this was faxed previously, and was denied.   Attempted new PA on [facility]: [id number] - PA Case ID: [id number]

## 2019-03-25 ENCOUNTER — Telehealth: Payer: BC Managed Care – PPO | Admitting: Physician Assistant

## 2019-03-25 DIAGNOSIS — N390 Urinary tract infection, site not specified: Secondary | ICD-10-CM | POA: Diagnosis not present

## 2019-03-25 MED ORDER — CEPHALEXIN 500 MG PO CAPS: 500.0000 mg | ORAL_CAPSULE | Freq: Two times a day (BID) | ORAL | 0 refills | Status: AC

## 2019-03-25 NOTE — Progress Notes (Signed)
We are sorry that you are not feeling well.  Here is how we plan to help!  Based on what you shared with me it looks like you most likely have a simple urinary tract infection.  A UTI (Urinary Tract Infection) is a bacterial infection of the bladder.  Most cases of urinary tract infections are simple to treat but a key part of your care is to encourage you to drink plenty of fluids and watch your symptoms carefully.  I have prescribed Keflex 500 mg twice a day for 7 days.  Your symptoms should gradually improve. Call us if the burning in your urine worsens, you develop worsening fever, back pain or pelvic pain or if your symptoms do not resolve after completing the antibiotic.  Urinary tract infections can be prevented by drinking plenty of water to keep your body hydrated.  Also be sure when you wipe, wipe from front to back and don't hold it in!  If possible, empty your bladder every 4 hours.  Your e-visit answers were reviewed by a board certified advanced clinical practitioner to complete your personal care plan.  Depending on the condition, your plan could have included both over the counter or prescription medications.  If there is a problem please reply  once you have received a response from your provider.  Your safety is important to [place].  If you have drug allergies check your prescription carefully.    You can use MyChart to ask questions about today's visit, request a non-urgent call back, or ask for a work or school excuse for 24 hours related to this e-Visit. If it has been greater than 24 hours you will need to follow up with your provider, or enter a new e-Visit to address those concerns.   You will get an e-mail in the next two days asking about your experience.  I hope that your e-visit has been valuable and will speed your recovery. Thank you for using e-visits.  5 minutes spent on this chart

## 2019-03-26 NOTE — Telephone Encounter (Signed)
Livalo appeals info re-faxed as [facility] closed case for 2nd PA submitted

## 2019-03-31 ENCOUNTER — Ambulatory Visit (INDEPENDENT_AMBULATORY_CARE_PROVIDER_SITE_OTHER): Payer: BC Managed Care – PPO | Admitting: Physician Assistant

## 2019-04-03 NOTE — Telephone Encounter (Signed)
Spoke with [place] [employer] about appeals. Patient has to sign "member representation form" in order to process appeals. They will fax this to our office.

## 2019-04-07 NOTE — Telephone Encounter (Signed)
No fax received from insurance plan for member representation form. Called customer service and was on hold for 10+ minutes

## 2019-04-16 ENCOUNTER — Encounter (INDEPENDENT_AMBULATORY_CARE_PROVIDER_SITE_OTHER): Payer: Self-pay | Admitting: Physician Assistant

## 2019-04-16 ENCOUNTER — Ambulatory Visit (INDEPENDENT_AMBULATORY_CARE_PROVIDER_SITE_OTHER): Payer: BC Managed Care – PPO | Admitting: Physician Assistant

## 2019-04-16 ENCOUNTER — Other Ambulatory Visit: Payer: Self-pay

## 2019-04-16 VITALS — BP 120/75 | HR 68 | Temp 97.8°F | Ht 68.0 in | Wt 325.0 lb

## 2019-04-16 DIAGNOSIS — Z9189 Other specified personal risk factors, not elsewhere classified: Secondary | ICD-10-CM

## 2019-04-16 DIAGNOSIS — Z6841 Body Mass Index (BMI) 40.0 and over, adult: Secondary | ICD-10-CM

## 2019-04-16 DIAGNOSIS — I10 Essential (primary) hypertension: Secondary | ICD-10-CM | POA: Diagnosis not present

## 2019-04-16 DIAGNOSIS — E7849 Other hyperlipidemia: Secondary | ICD-10-CM | POA: Diagnosis not present

## 2019-04-16 DIAGNOSIS — R7303 Prediabetes: Secondary | ICD-10-CM | POA: Diagnosis not present

## 2019-04-16 DIAGNOSIS — E559 Vitamin D deficiency, unspecified: Secondary | ICD-10-CM

## 2019-04-16 MED ORDER — VITAMIN D (ERGOCALCIFEROL) 1.25 MG (50000 UNIT) PO CAPS: 50000.0000 [IU] | ORAL_CAPSULE | ORAL | 0 refills | Status: DC

## 2019-04-16 MED ORDER — METFORMIN HCL 500 MG PO TABS: 500.0000 mg | ORAL_TABLET | Freq: Two times a day (BID) | ORAL | 0 refills | Status: DC

## 2019-04-16 MED ORDER — CHLORTHALIDONE 25 MG PO TABS: 25.0000 mg | ORAL_TABLET | Freq: Every day | ORAL | 0 refills | Status: DC

## 2019-04-16 NOTE — Progress Notes (Signed)
Chief Complaint:   OBESITY Melanie [name] is here to discuss her progress with her obesity treatment plan along with follow-up of her obesity related diagnoses. Melanie [name] is on the [employer] and states she is following her eating plan approximately 50% of the time. Melanie [name] states she is exercising for 0 minutes 0 times per week.  Today's visit was #: 77 Starting weight: 338 lbs Starting date: 05/23/2017 Today's weight: 325 lbs Today's date: 04/16/2019 Total lbs lost to date: 13 lbs Total lbs lost since last in-office visit: 0  Interim History: Melanie [name] reports that she has struggled with the plan due to dealing with a lot of stress.  She says she sometimes does not eat and will sometimes comfort eat.  Subjective:   1. Vitamin D deficiency Melanie [name]'s Vitamin D level was 44.8 on 12/24/2018. She is currently taking vit D. She denies nausea, vomiting or muscle weakness.  2. Prediabetes Melanie [name] has a diagnosis of prediabetes based on her elevated HgA1c and was informed this puts her at greater risk of developing diabetes. She continues to work on diet and exercise to decrease her risk of diabetes. She denies nausea or hypoglycemia.  She is taking metformin 500 mg twice daily.  No nausea, vomiting, or diarrhea.  She is due for labs.  Lab Results  Component Value Date   HGBA1C 5.6 12/24/2018   Lab Results  Component Value Date   INSULIN 46.3 (H) 12/24/2018   INSULIN 39.2 (H) 07/25/2018   INSULIN 30.4 (H) 05/01/2018   INSULIN 39.0 (H) 12/13/2017   INSULIN 24.3 09/05/2017   3. Other hyperlipidemia Melanie [name] has hyperlipidemia and has been trying to improve her cholesterol levels with intensive lifestyle modification including a low saturated fat diet, exercise and weight loss. She denies any chest pain, claudication or myalgias.  She is not currently taking any medications for hyperlipidemia.  She is due for labs today.  Lab Results  Component Value Date   ALT 73 (H) 01/16/2019   AST 62 (H) 01/16/2019   ALKPHOS 138 (H) 01/16/2019   BILITOT 1.7 (H) 01/16/2019   Lab Results  Component Value Date   CHOL 227 (H) 12/24/2018   HDL 60 12/24/2018   LDLCALC 135 (H) 12/24/2018   TRIG 180 (H) 12/24/2018   CHOLHDL 7.0 (H) 07/26/2018   4. Essential hypertension Review: taking medications as instructed, no medication side effects noted, no chest pain.  She is taking chlorthalidone 25 mg daily for blood pressure control.  Blood pressure is normal.  BP Readings from Last 3 Encounters:  04/16/19 120/75  03/04/19 109/71  02/13/19 119/73   5. At risk for heart disease Melanie [name] is at a higher than average risk for cardiovascular disease due to obesity. Reviewed: no chest pain on exertion, no dyspnea on exertion, and no swelling of ankles.  Assessment/Plan:   1. Vitamin D deficiency Low Vitamin D level contributes to fatigue and are associated with obesity, breast, and colon cancer. She agrees to continue to take prescription Vitamin D @50 ,000 IU every week and will follow-up for routine testing of Vitamin D, at least 2-3 times per year to avoid over-replacement. - VITAMIN D 25 Hydroxy (Vit-D Deficiency, Fractures) - Vitamin D, Ergocalciferol, (DRISDOL) 1.25 MG (50000 UNIT) CAPS capsule; Take 1 capsule (50,000 Units total) by mouth every 7 (seven) days. Tuesday and Friday  Dispense: 10 capsule; Refill: 0  2. Prediabetes Melanie [name] will continue to work on weight loss, exercise, and decreasing simple carbohydrates to help decrease the risk of  diabetes.  - Hemoglobin A1c - Insulin, random - metFORMIN (GLUCOPHAGE) 500 MG tablet; Take 1 tablet (500 mg total) by mouth 2 (two) times daily with a meal.  Dispense: 180 tablet; Refill: 0  3. Other hyperlipidemia Cardiovascular risk and specific lipid/LDL goals reviewed.  We discussed several lifestyle modifications today and Dhamar will continue to work on diet, exercise and weight loss efforts. Orders and follow up as documented in patient record.    Counseling Intensive lifestyle modifications are the first line treatment for this issue. . Dietary changes: Increase soluble fiber. Decrease simple carbohydrates. . Exercise changes: Moderate to vigorous-intensity aerobic activity 150 minutes per week if tolerated. . Lipid-lowering medications: see documented in medical record. - Comprehensive metabolic panel - Lipid Panel With LDL/HDL Ratio  4. Essential hypertension Melanie [name] is working on healthy weight loss and exercise to improve blood pressure control. We will watch for signs of hypotension as she continues her lifestyle modifications. - chlorthalidone (HYGROTON) 25 MG tablet; Take 1 tablet (25 mg total) by mouth daily.  Dispense: 180 tablet; Refill: 0  5. At risk for heart disease Melanie [name] was given approximately 15 minutes of coronary artery disease prevention counseling today. She is 44 y.o. female and has risk factors for heart disease including obesity. We discussed intensive lifestyle modifications today with an emphasis on specific weight loss instructions and strategies.   Repetitive spaced learning was employed today to elicit superior memory formation and behavioral change.  6. Class 3 severe obesity with serious comorbidity and body mass index (BMI) of 45.0 to 49.9 in adult, unspecified obesity type (HCC) Melanie [name] is currently in the action stage of change. As such, her goal is to continue with weight loss efforts. She has agreed to the Category 2 Plan.   Exercise goals: For substantial health benefits, adults should do at least 150 minutes (2 hours and 30 minutes) a week of moderate-intensity, or 75 minutes (1 hour and 15 minutes) a week of vigorous-intensity aerobic physical activity, or an equivalent combination of moderate- and vigorous-intensity aerobic activity. Aerobic activity should be performed in episodes of at least 10 minutes, and preferably, it should be spread throughout the week.  Behavioral modification strategies: no  skipping meals and meal planning and cooking strategies.  Melanie [name] has agreed to follow-up with our clinic in 2 weeks. She was informed of the importance of frequent follow-up visits to maximize her success with intensive lifestyle modifications for her multiple health conditions.   Melanie [name] was informed we would discuss her lab results at her next visit unless there is a critical issue that needs to be addressed sooner. Melanie [name] agreed to keep her next visit at the agreed upon time to discuss these results.  Objective:   Blood pressure 120/75, pulse 68, temperature 97.8 F (36.6 C), temperature source Oral, height 5' 8"  (1.727 m), weight (!) 325 lb (147.4 kg), SpO2 95 %. Body mass index is 49.42 kg/m.  General: Cooperative, alert, well developed, in no acute distress. HEENT: Conjunctivae and lids unremarkable. Cardiovascular: Regular rhythm.  Lungs: Normal work of breathing. Neurologic: No focal deficits.   Lab Results  Component Value Date   CREATININE 1.16 (H) 01/16/2019   BUN 22 (H) 01/16/2019   NA 138 01/16/2019   K 3.6 01/16/2019   CL 99 01/16/2019   CO2 30 01/16/2019   Lab Results  Component Value Date   ALT 73 (H) 01/16/2019   AST 62 (H) 01/16/2019   ALKPHOS 138 (H) 01/16/2019   BILITOT 1.7 (H) 01/16/2019  Lab Results  Component Value Date   HGBA1C 5.6 12/24/2018   HGBA1C 5.5 07/25/2018   HGBA1C 5.5 05/01/2018   HGBA1C 5.6 12/13/2017   HGBA1C 5.0 09/05/2017   Lab Results  Component Value Date   INSULIN 46.3 (H) 12/24/2018   INSULIN 39.2 (H) 07/25/2018   INSULIN 30.4 (H) 05/01/2018   INSULIN 39.0 (H) 12/13/2017   INSULIN 24.3 09/05/2017   Lab Results  Component Value Date   TSH 3.950 12/24/2018   Lab Results  Component Value Date   CHOL 227 (H) 12/24/2018   HDL 60 12/24/2018   LDLCALC 135 (H) 12/24/2018   TRIG 180 (H) 12/24/2018   CHOLHDL 7.0 (H) 07/26/2018   Lab Results  Component Value Date   WBC 7.7 01/16/2019   HGB 13.9 01/16/2019   HCT 41.1  01/16/2019   MCV 94.5 01/16/2019   PLT 221 01/16/2019   Lab Results  Component Value Date   IRON 95 06/27/2013   TIBC 371 06/27/2013   FERRITIN 401 (H) 06/27/2013   Attestation Statements:   Reviewed by clinician on day of visit: allergies, medications, problem list, medical history, surgical history, family history, social history, and previous encounter notes.  I, Water quality scientist, CMA, am acting as Location manager for [employer], PA-C.  I have reviewed the above documentation for accuracy and completeness, and I agree with the above. Abby Potash, PA-C

## 2019-04-17 LAB — HEMOGLOBIN A1C
Est. average glucose Bld gHb Est-mCnc: 114 mg/dL
Hgb A1c MFr Bld: 5.6 % (ref 4.8–5.6)

## 2019-04-17 LAB — LIPID PANEL WITH LDL/HDL RATIO
Cholesterol, Total: 220 mg/dL — ABNORMAL HIGH (ref 100–199)
HDL: 63 mg/dL (ref >39)
LDL Chol Calc (NIH): 122 mg/dL — ABNORMAL HIGH (ref 0–99)
LDL/HDL Ratio: 1.9 ratio (ref 0.0–3.2)
Triglycerides: 200 mg/dL — ABNORMAL HIGH (ref 0–149)
VLDL Cholesterol Cal: 35 mg/dL (ref 5–40)

## 2019-04-17 LAB — COMPREHENSIVE METABOLIC PANEL WITH GFR
ALT: 62 IU/L — ABNORMAL HIGH (ref 0–32)
AST: 40 IU/L (ref 0–40)
Albumin/Globulin Ratio: 1.8 (ref 1.2–2.2)
Albumin: 4.4 g/dL (ref 3.8–4.8)
Alkaline Phosphatase: 149 IU/L — ABNORMAL HIGH (ref 39–117)
BUN/Creatinine Ratio: 22 (ref 9–23)
BUN: 27 mg/dL — ABNORMAL HIGH (ref 6–24)
Bilirubin Total: 0.6 mg/dL (ref 0.0–1.2)
CO2: 26 mmol/L (ref 20–29)
Calcium: 10.9 mg/dL — ABNORMAL HIGH (ref 8.7–10.2)
Chloride: 101 mmol/L (ref 96–106)
Creatinine, Ser: 1.21 mg/dL — ABNORMAL HIGH (ref 0.57–1.00)
GFR calc Af Amer: 62 mL/min/1.73 (ref >59)
GFR calc non Af Amer: 54 mL/min/1.73 — ABNORMAL LOW (ref >59)
Globulin, Total: 2.4 g/dL (ref 1.5–4.5)
Glucose: 128 mg/dL — ABNORMAL HIGH (ref 65–99)
Potassium: 3.4 mmol/L — ABNORMAL LOW (ref 3.5–5.2)
Sodium: 141 mmol/L (ref 134–144)
Total Protein: 6.8 g/dL (ref 6.0–8.5)

## 2019-04-17 LAB — INSULIN, RANDOM: INSULIN: 72.8 u[IU]/mL — ABNORMAL HIGH (ref 2.6–24.9)

## 2019-04-17 LAB — VITAMIN D 25 HYDROXY (VIT D DEFICIENCY, FRACTURES): Vit D, 25-Hydroxy: 43.7 ng/mL (ref 30.0–100.0)

## 2019-04-23 ENCOUNTER — Other Ambulatory Visit: Payer: Self-pay | Admitting: Family Medicine

## 2019-04-23 DIAGNOSIS — E039 Hypothyroidism, unspecified: Secondary | ICD-10-CM

## 2019-04-23 NOTE — Telephone Encounter (Signed)
Requested medication (s) are due for refill today: no  Requested medication (s) are on the active medication list: yes  Last refill: 04/21/2019  Future visit scheduled:no  Notes to clinic:  TSH needs to be rechecked within 3 months after an abnormal result. Refill until TSH is due.   Requested Prescriptions  Pending Prescriptions Disp Refills   levothyroxine (SYNTHROID) 88 MCG tablet [Pharmacy Med Name: Levothyroxine Sodium 38mg Tablet] 30 tablet 0    Sig: Take 1 tablet by mouth daily.      Endocrinology:  Hypothyroid Agents Failed - 04/23/2019  5:42 AM      Failed - TSH needs to be rechecked within 3 months after an abnormal result. Refill until TSH is due.      Passed - TSH in normal range and within 360 days    TSH  Date Value Ref Range Status  12/24/2018 3.950 0.450 - 4.500 uIU/mL Final          Passed - Valid encounter within last 12 months    Recent Outpatient Visits           3 months ago Hematuria, unspecified type   [facility]   8 months ago Hypercalcemia   [facility]   9 months ago Malaise and fatigue   [facility]   9 months ago [facility]   10 months ago [facility] intolerance   [facility]

## 2019-04-24 ENCOUNTER — Other Ambulatory Visit (INDEPENDENT_AMBULATORY_CARE_PROVIDER_SITE_OTHER): Payer: Self-pay | Admitting: Family Medicine

## 2019-04-24 DIAGNOSIS — E559 Vitamin D deficiency, unspecified: Secondary | ICD-10-CM

## 2019-04-24 NOTE — Telephone Encounter (Signed)
Called patients insurance on 2/10 to obtain form. They were unable to send and transferred me to customer service with no success. Located a form online and sent links to patient via MyChart message to see if she can complete and get back to our office.

## 2019-05-01 NOTE — Telephone Encounter (Signed)
LM for patient that MyChart message has been sent concerning forms needed for [employer] appeal. Asked that she review and respond via MyChart or call

## 2019-05-06 ENCOUNTER — Other Ambulatory Visit: Payer: Self-pay

## 2019-05-06 ENCOUNTER — Encounter (INDEPENDENT_AMBULATORY_CARE_PROVIDER_SITE_OTHER): Payer: Self-pay | Admitting: Physician Assistant

## 2019-05-06 ENCOUNTER — Ambulatory Visit (INDEPENDENT_AMBULATORY_CARE_PROVIDER_SITE_OTHER): Payer: BC Managed Care – PPO | Admitting: Physician Assistant

## 2019-05-06 VITALS — BP 110/75 | HR 62 | Temp 97.8°F | Ht 68.0 in | Wt 323.0 lb

## 2019-05-06 DIAGNOSIS — Z9189 Other specified personal risk factors, not elsewhere classified: Secondary | ICD-10-CM | POA: Diagnosis not present

## 2019-05-06 DIAGNOSIS — F419 Anxiety disorder, unspecified: Secondary | ICD-10-CM

## 2019-05-06 DIAGNOSIS — E559 Vitamin D deficiency, unspecified: Secondary | ICD-10-CM | POA: Diagnosis not present

## 2019-05-06 DIAGNOSIS — Z6841 Body Mass Index (BMI) 40.0 and over, adult: Secondary | ICD-10-CM

## 2019-05-06 MED ORDER — VITAMIN D (ERGOCALCIFEROL) 1.25 MG (50000 UNIT) PO CAPS: 50000.0000 [IU] | ORAL_CAPSULE | ORAL | 0 refills | Status: DC

## 2019-05-06 MED ORDER — BUSPIRONE HCL 15 MG PO TABS: ORAL_TABLET | ORAL | 0 refills | Status: DC

## 2019-05-06 NOTE — Progress Notes (Signed)
Chief Complaint:   OBESITY Melanie [name] is here to discuss her progress with her obesity treatment plan along with follow-up of her obesity related diagnoses. Melanie [name] is on the Category 2 Plan and states she is following her eating plan approximately 75% of the time. Money states she is walking for 30-60 minutes 4-5 times per week.  Today's visit was #: 71 Starting weight: 338 lbs Starting date: 05/23/17 Today's weight: 323 lbs Today's date: 05/06/2019 Total lbs lost to date: 15 Total lbs lost since last in-office visit: 2  Interim History: Melanie [name] reports that she has done a better job getting more of the food in daily. She continues to be under a lot of stress at home. She is walking more often.  Subjective:   1. Vitamin D deficiency Dawson is on Vit D twice weekly. She denies nausea, vomiting, or muscle weakness. Last Vit D level was 43.7 on 04/16/2019.  2. Anxiety Melanie [name] is on Buspar, and she notes it helps with anxiety. She denies suicidal ideas or homicidal ideas, or panic.  3. At risk for osteoporosis Melanie [name] is at higher risk of osteopenia and osteoporosis due to Vitamin D deficiency.   Assessment/Plan:   1. Vitamin D deficiency Low Vitamin D level contributes to fatigue and are associated with obesity, breast, and colon cancer. We will refill prescription Vitamin D for 1 month. [name] will follow-up for routine testing of Vitamin D, at least 2-3 times per year to avoid over-replacement.  - Vitamin D, Ergocalciferol, (DRISDOL) 1.25 MG (50000 UNIT) CAPS capsule; Take 1 capsule (50,000 Units total) by mouth every 7 (seven) days. Tuesday and Friday  Dispense: 10 capsule; Refill: 0  2. Anxiety Behavior modification techniques were discussed today to help Raidyn deal with her anxiety. We will refill Buspar for 1 month. Orders and follow up as documented in patient record.   - busPIRone (BUSPAR) 15 MG tablet; Take 1 tablet by mouth twice daily or as directed.  Dispense: 60 tablet; Refill: 0  3. At  risk for osteoporosis Melanie [name] was given approximately 15 minutes of osteoporosis prevention counseling today. Melanie [name] is at risk for osteopenia and osteoporosis due to her Vitamin D deficiency. She was encouraged to take her Vitamin D and follow her higher calcium diet and increase strengthening exercise to help strengthen her bones and decrease her risk of osteopenia and osteoporosis.  Repetitive spaced learning was employed today to elicit superior memory formation and behavioral change.  4. Class 3 severe obesity with serious comorbidity and body mass index (BMI) of 45.0 to 49.9 in adult, unspecified obesity type (HCC) Melanie [name] is currently in the action stage of change. As such, her goal is to continue with weight loss efforts. She has agreed to the Category 2 Plan.   Exercise goals: Melanie [name] is to continue her current exercise regimen as is.  Behavioral modification strategies: no skipping meals and meal planning and cooking strategies.  Melanie [name] has agreed to follow-up with our clinic in 3 weeks. She was informed of the importance of frequent follow-up visits to maximize her success with intensive lifestyle modifications for her multiple health conditions.   Objective:   Blood pressure 110/75, pulse 62, temperature 97.8 F (36.6 C), temperature source Oral, height 5' 8"  (1.727 m), weight (!) 323 lb (146.5 kg), SpO2 95 %. Body mass index is 49.11 kg/m.  General: Cooperative, alert, well developed, in no acute distress. HEENT: Conjunctivae and lids unremarkable. Cardiovascular: Regular rhythm.  Lungs: Normal work of breathing. Neurologic: No focal  deficits.   Lab Results  Component Value Date   CREATININE 1.21 (H) 04/16/2019   BUN 27 (H) 04/16/2019   NA 141 04/16/2019   K 3.4 (L) 04/16/2019   CL 101 04/16/2019   CO2 26 04/16/2019   Lab Results  Component Value Date   ALT 62 (H) 04/16/2019   AST 40 04/16/2019   ALKPHOS 149 (H) 04/16/2019   BILITOT 0.6 04/16/2019   Lab Results  Component  Value Date   HGBA1C 5.6 04/16/2019   HGBA1C 5.6 12/24/2018   HGBA1C 5.5 07/25/2018   HGBA1C 5.5 05/01/2018   HGBA1C 5.6 12/13/2017   Lab Results  Component Value Date   INSULIN 72.8 (H) 04/16/2019   INSULIN 46.3 (H) 12/24/2018   INSULIN 39.2 (H) 07/25/2018   INSULIN 30.4 (H) 05/01/2018   INSULIN 39.0 (H) 12/13/2017   Lab Results  Component Value Date   TSH 3.950 12/24/2018   Lab Results  Component Value Date   CHOL 220 (H) 04/16/2019   HDL 63 04/16/2019   LDLCALC 122 (H) 04/16/2019   TRIG 200 (H) 04/16/2019   CHOLHDL 7.0 (H) 07/26/2018   Lab Results  Component Value Date   WBC 7.7 01/16/2019   HGB 13.9 01/16/2019   HCT 41.1 01/16/2019   MCV 94.5 01/16/2019   PLT 221 01/16/2019   Lab Results  Component Value Date   IRON 95 06/27/2013   TIBC 371 06/27/2013   FERRITIN 401 (H) 06/27/2013   Attestation Statements:   Reviewed by clinician on day of visit: allergies, medications, problem list, medical history, surgical history, family history, social history, and previous encounter notes.   Wilhemena Durie, am acting as transcriptionist for [employer], PA-C.  I have reviewed the above documentation for accuracy and completeness, and I agree with the above. Abby Potash, PA-C

## 2019-05-23 ENCOUNTER — Other Ambulatory Visit: Payer: Self-pay | Admitting: Family Medicine

## 2019-05-23 ENCOUNTER — Encounter (INDEPENDENT_AMBULATORY_CARE_PROVIDER_SITE_OTHER): Payer: Self-pay | Admitting: Physician Assistant

## 2019-05-23 DIAGNOSIS — F419 Anxiety disorder, unspecified: Secondary | ICD-10-CM

## 2019-05-23 DIAGNOSIS — T7840XS Allergy, unspecified, sequela: Secondary | ICD-10-CM

## 2019-05-23 DIAGNOSIS — E039 Hypothyroidism, unspecified: Secondary | ICD-10-CM

## 2019-05-26 ENCOUNTER — Encounter: Payer: Self-pay | Admitting: Registered Nurse

## 2019-05-26 ENCOUNTER — Other Ambulatory Visit: Payer: Self-pay

## 2019-05-26 ENCOUNTER — Telehealth (INDEPENDENT_AMBULATORY_CARE_PROVIDER_SITE_OTHER): Payer: BC Managed Care – PPO | Admitting: Registered Nurse

## 2019-05-26 ENCOUNTER — Ambulatory Visit (INDEPENDENT_AMBULATORY_CARE_PROVIDER_SITE_OTHER): Payer: BC Managed Care – PPO | Admitting: Physician Assistant

## 2019-05-26 VITALS — Temp 97.5°F

## 2019-05-26 DIAGNOSIS — R059 Cough, unspecified: Secondary | ICD-10-CM

## 2019-05-26 DIAGNOSIS — R05 Cough: Secondary | ICD-10-CM

## 2019-05-26 DIAGNOSIS — R0602 Shortness of breath: Secondary | ICD-10-CM | POA: Diagnosis not present

## 2019-05-26 DIAGNOSIS — R0989 Other specified symptoms and signs involving the circulatory and respiratory systems: Secondary | ICD-10-CM

## 2019-05-26 MED ORDER — HYDROCODONE-HOMATROPINE 5-1.5 MG/5ML PO SYRP: 5.0000 mL | ORAL_SOLUTION | Freq: Three times a day (TID) | ORAL | 0 refills | Status: DC | PRN

## 2019-05-26 MED ORDER — ALBUTEROL SULFATE HFA 108 (90 BASE) MCG/ACT IN AERS: 2.0000 | INHALATION_SPRAY | Freq: Four times a day (QID) | RESPIRATORY_TRACT | 0 refills | Status: DC | PRN

## 2019-05-26 MED ORDER — GUAIFENESIN-DM 100-10 MG/5ML PO SYRP: 5.0000 mL | ORAL_SOLUTION | ORAL | 0 refills | Status: DC | PRN

## 2019-05-26 NOTE — Progress Notes (Signed)
Telemedicine Encounter- SOAP NOTE Established Patient  This telephone encounter was conducted with the patient's (or proxy's) verbal consent via audio telecommunications: yes  Patient was instructed to have this encounter in a suitably private space; and to only have persons present to whom they give permission to participate. In addition, patient identity was confirmed by use of name plus two identifiers (DOB and address).  I discussed the limitations, risks, security and privacy concerns of performing an evaluation and management service by telephone and the availability of in person appointments. I also discussed with the patient that there may be a patient responsible charge related to this service. The patient expressed understanding and agreed to proceed.  I spent a total of 15 minutes talking with the patient or their proxy.  Chief Complaint  Patient presents with  . Sore Throat    patient stated that she has been having some symtoms that she thought was just a cold  she is experiencing  sore throat , body aches, headaches , congestion, no energy , diarrhea, and cold chill at the moment. Per patient she is currently taking cold medication and scheduled to get the covid vaccine tomorrow. wondered what should she do?    Subjective   Melanie [name] is a 44 y.o. established patient. Telephone visit today for ongoing COVID symptoms  HPI Onset around 1 week ago with sore throat, since has progressed to body aches, headaches, congestion in face and chest, productive cough, chills and cold sweats, fatigue and malaise. 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  Has been taking OTC cold medication   Patient Active Problem List   Diagnosis Date Noted  . Vitamin D deficiency 12/26/2018  . Class 3 severe obesity with serious comorbidity and body mass index (BMI) of 45.0 to 49.9  in adult ([place]) 12/26/2018  . Right tennis elbow 07/30/2018  . Cubital tunnel syndrome 07/30/2018  . Chronic pain of both feet 01/23/2018  . Bilateral calcaneal spurs 01/23/2018  . Other fatigue 05/23/2017  . Shortness of breath on exertion 05/23/2017  . Prediabetes 05/23/2017  . Liver fibrosis 05/23/2017  . Hyperlipidemia 04/16/2017  . OSA (obstructive sleep apnea) 11/23/2015  . NASH (nonalcoholic steatohepatitis) 08/27/2015  . Dependent edema 08/27/2015  . BMI 40.0-44.9, adult ([place]) 03/15/2015  . Hepatic fibrosis 06/08/2014  . Depression with anxiety 06/06/2013  . Perennial allergic rhinitis 06/06/2013    Past Medical History:  Diagnosis Date  . Allergy   . Anemia   . Anxiety   . Back pain   . Constipation   . Depression   . Fatty liver   . GERD (gastroesophageal reflux disease)   . Hyperlipidemia   . Infertility, female   . Joint pain   . Kidney problem   . 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 Migraines    LIGHT SENSITIVITY , NOW ON IMITREX SINCE 08-02-2018,NOTICES IMPROVEMENT   . Multiple food allergies   . Muscle pain   . Numbness and tingling in both hands   . Pre-diabetes   . Shortness of breath    was due to a medication side effect, now off medication , SOB resolved   . Sleep apnea   . Stress   . Swelling of both lower extremities     Current Outpatient Medications  Medication Sig Dispense Refill  . Alirocumab (  PRALUENT) 150 MG/ML SOAJ Inject 1 Dose into the skin every 14 (fourteen) days. 2 pen 11  . buPROPion (WELLBUTRIN SR) 150 MG 12 hr tablet Take 1 tablet (150 mg total) by mouth 2 (two) times daily. 180 tablet 0  . busPIRone (BUSPAR) 15 MG tablet Take 1 tablet by mouth twice daily or as directed. 60 tablet 0  . cetirizine (ZYRTEC) 10 MG tablet Take 10 mg by mouth at bedtime.     . chlorthalidone (HYGROTON) 25 MG tablet Take 1 tablet (25 mg total) by mouth daily. 180 tablet 0  . citalopram (CELEXA) 40 MG tablet Take 1 tablet by  mouth daily. 30 tablet 0  . fluticasone (FLONASE) 50 MCG/ACT nasal spray Place 2 sprays into both nostrils daily. (Patient taking differently: Place 2 sprays into both nostrils daily as needed for allergies. ) 48 g 3  . levothyroxine (SYNTHROID) 88 MCG tablet Take 1 tablet by mouth daily. 30 tablet 0  . LORazepam (ATIVAN) 1 MG tablet Take 1 tablet (1 mg total) by mouth 2 (two) times daily as needed for anxiety. 20 tablet 0  . meclizine (ANTIVERT) 25 MG tablet TAKE ONE TABLET BY MOUTH THREE TIMES A DAY AS NEEDED FOR DIZZINESS (Patient taking differently: Take 25 mg by mouth 3 (three) times daily as needed for dizziness. ) 30 tablet 0  . meloxicam (MOBIC) 15 MG tablet Take 15 mg by mouth daily as needed for pain.     . metFORMIN (GLUCOPHAGE) 500 MG tablet Take 1 tablet (500 mg total) by mouth 2 (two) times daily with a meal. 180 tablet 0  . montelukast (SINGULAIR) 10 MG tablet Take 1 tablet by mouth at bedtime. 30 tablet 0  . Multiple Vitamin (MULTIVITAMIN) tablet Take 1 tablet by mouth daily.    . Omega-3 Fatty Acids (FISH OIL PO) Take 1 capsule by mouth 2 (two) times a day.     Marland Kitchen omeprazole (PRILOSEC) 20 MG capsule Take 1 capsule (20 mg total) by mouth daily. 90 capsule 0  . ondansetron (ZOFRAN) 4 MG tablet Take 1 tablet (4 mg total) by mouth every 8 (eight) hours as needed for nausea or vomiting. 12 tablet 0  . SUMAtriptan (IMITREX) 50 MG tablet TAKE ONE TABLET BY MOUTH AT ONSET OF HEADACHE; MAY REPEAT ONE TABLET IN 2 HOURS IF NEEDED. (Patient taking differently: Take 50 mg by mouth See admin instructions. Take 1 tablet at onset of headache; may repeat one tablet in 2 hours if needed.) 10 tablet 0  . topiramate (TOPAMAX) 50 MG tablet 1/2 pill each bedtime x 1 week, then 1 pill nightly x 1 week, then 1-1/2 pills nightly x 1 week, then 2 pills nightly thereafter. 60 tablet 5  . Ubrogepant (UBRELVY) 50 MG TABS Take 50 mg by mouth as needed (may repeat once after 2 hours). 20 tablet 3  . Vitamin D,  Ergocalciferol, (DRISDOL) 1.25 MG (50000 UNIT) CAPS capsule Take 1 capsule (50,000 Units total) by mouth every 7 (seven) days. Tuesday and Friday 10 capsule 0   No current facility-administered medications for this visit.    Allergies  Allergen Reactions  . Welchol [Colesevelam] Shortness Of Breath  . Lidocaine Other (See Comments)    Flu like sx's  . Relafen [Nabumetone] Other (See Comments)    migraine  . Celebrex [Celecoxib] Rash  . Sulfa Antibiotics Rash    Social History   Socioeconomic History  . Marital status: Married    Spouse name: [name]  . Number of children:  5  . Years of education: in [place] program  . Highest education level: Bachelor's degree (e.g., BA, AB, BS)  Occupational History  . Occupation: Product manager: [employer]    Comment: ESL  Tobacco Use  . Smoking status: Never Smoker  . Smokeless tobacco: Never Used  Substance and Sexual Activity  . Alcohol use: No    Alcohol/week: 0.0 standard drinks  . Drug use: No  . Sexual activity: Yes    Partners: Male    Birth control/protection: Post-menopausal  Other Topics Concern  . Not on file  Social History Narrative   Lives with her husband and their 5 adpoted children.    Adopted 2 sets of siblings, all with psychiatric diagnoses (ADHD, Asperger's, ODD, PTSD, depression, bipolar disorder).   Her husband has Asperger's, and works in disability services.   Occasionally drinks coke or pepsi    Social Determinants of Health   Financial Resource Strain:   . Difficulty of Paying Living Expenses:   Food Insecurity:   . Worried About Charity fundraiser in the Last Year:   . Arboriculturist in the Last Year:   Transportation Needs:   . Film/video editor (Medical):   Marland Kitchen Lack of Transportation (Non-Medical):   Physical Activity:   . Days of Exercise per Week:   . Minutes of Exercise per Session:   Stress:   . Feeling of Stress :   Social Connections:   . Frequency of Communication  with Friends and Family:   . Frequency of Social Gatherings with Friends and Family:   . Attends Religious Services:   . Active Member of Clubs or Organizations:   . Attends Archivist Meetings:   Marland Kitchen Marital Status:   Intimate Partner Violence:   . Fear of Current or Ex-Partner:   . Emotionally Abused:   Marland Kitchen Physically Abused:   . Sexually Abused:     ROS Per hpi   Objective   Vitals as reported by the patient: Today's Vitals   05/26/19 0842  Temp: (!) 97.5 F (36.4 C)    Ivyana was seen today for sore throat.  Diagnoses and all orders for this visit:  Shortness of breath -     guaiFENesin-dextromethorphan (ROBITUSSIN DM) 100-10 MG/5ML syrup; Take 5 mLs by mouth every 4 (four) hours as needed for cough. -     HYDROcodone-homatropine (HYCODAN) 5-1.5 MG/5ML syrup; Take 5 mLs by mouth every 8 (eight) hours as needed for cough. -     albuterol (VENTOLIN HFA) 108 (90 Base) MCG/ACT inhaler; Inhale 2 puffs into the lungs every 6 (six) hours as needed for wheezing or shortness of breath.  Cough -     guaiFENesin-dextromethorphan (ROBITUSSIN DM) 100-10 MG/5ML syrup; Take 5 mLs by mouth every 4 (four) hours as needed for cough. -     HYDROcodone-homatropine (HYCODAN) 5-1.5 MG/5ML syrup; Take 5 mLs by mouth every 8 (eight) hours as needed for cough.  Chest congestion -     guaiFENesin-dextromethorphan (ROBITUSSIN DM) 100-10 MG/5ML syrup; Take 5 mLs by mouth every 4 (four) hours as needed for cough.   PLAN  She will get COVID testing ASAP - instructions to be sent via MyChart  Stay home from work for 1 week at minimum - work note to be sent via [place] therapy encouraged including BRAT diet, rest, adequate water intake, as well as medications listed above  Continue allergy meds  Patient encouraged to call  clinic with any questions, comments, or concerns.  I discussed the assessment and treatment plan with the patient. The patient was provided an opportunity  to ask questions and all were answered. The patient agreed with the plan and demonstrated an understanding of the instructions.   The patient was advised to call back or seek an in-person evaluation if the symptoms worsen or if the condition fails to improve as anticipated.  I provided 15 minutes of non-face-to-face time during this encounter.  Maximiano Coss, NP  Primary Care at [facility]

## 2019-05-26 NOTE — Patient Instructions (Signed)
° ° ° °  If you have lab work done today you will be contacted with your lab results within the next 2 weeks.  If you have not heard from us then please contact us. The fastest way to get your results is to register for My Chart. ° ° °IF you received an x-ray today, you will receive an invoice from [facility]. Please contact [facility] at [phone] with questions or concerns regarding your invoice.  ° °IF you received labwork today, you will receive an invoice from [facility]. Please contact [facility] at [phone] with questions or concerns regarding your invoice.  ° °Our billing staff will not be able to assist you with questions regarding bills from these companies. ° °You will be contacted with the lab results as soon as they are available. The fastest way to get your results is to activate your My Chart account. Instructions are located on the last page of this paperwork. If you have not heard from us regarding the results in 2 weeks, please contact this office. °  ° ° ° °

## 2019-05-27 ENCOUNTER — Encounter (HOSPITAL_COMMUNITY): Payer: Self-pay | Admitting: Emergency Medicine

## 2019-05-27 ENCOUNTER — Telehealth (INDEPENDENT_AMBULATORY_CARE_PROVIDER_SITE_OTHER): Payer: BC Managed Care – PPO | Admitting: Registered Nurse

## 2019-05-27 ENCOUNTER — Encounter: Payer: Self-pay | Admitting: Registered Nurse

## 2019-05-27 ENCOUNTER — Ambulatory Visit: Payer: Self-pay

## 2019-05-27 ENCOUNTER — Other Ambulatory Visit: Payer: Self-pay

## 2019-05-27 ENCOUNTER — Emergency Department (HOSPITAL_COMMUNITY)
Admission: EM | Admit: 2019-05-27 | Discharge: 2019-05-27 | Disposition: A | Discharge status: Home / Self Care | Payer: BC Managed Care – PPO | Attending: Emergency Medicine | Admitting: Emergency Medicine

## 2019-05-27 DIAGNOSIS — K921 Melena: Secondary | ICD-10-CM

## 2019-05-27 DIAGNOSIS — U071 COVID-19: Secondary | ICD-10-CM | POA: Diagnosis not present

## 2019-05-27 DIAGNOSIS — K625 Hemorrhage of anus and rectum: Secondary | ICD-10-CM | POA: Diagnosis not present

## 2019-05-27 DIAGNOSIS — E876 Hypokalemia: Secondary | ICD-10-CM | POA: Diagnosis not present

## 2019-05-27 DIAGNOSIS — R5383 Other fatigue: Secondary | ICD-10-CM | POA: Diagnosis present

## 2019-05-27 HISTORY — DX: COVID-19: U07.1

## 2019-05-27 LAB — CBC
HCT: 42.9 % (ref 36.0–46.0)
Hemoglobin: 14.5 g/dL (ref 12.0–15.0)
MCH: 30.9 pg (ref 26.0–34.0)
MCHC: 33.8 g/dL (ref 30.0–36.0)
MCV: 91.5 fL (ref 80.0–100.0)
Platelets: 211 10*3/uL (ref 150–400)
RBC: 4.69 MIL/uL (ref 3.87–5.11)
RDW: 12.7 % (ref 11.5–15.5)
WBC: 4.7 10*3/uL (ref 4.0–10.5)
nRBC: 0 % (ref 0.0–0.2)

## 2019-05-27 LAB — TYPE AND SCREEN
ABO/RH(D): A NEG
Antibody Screen: NEGATIVE

## 2019-05-27 LAB — COMPREHENSIVE METABOLIC PANEL
ALT: 102 U/L — ABNORMAL HIGH (ref 0–44)
AST: 66 U/L — ABNORMAL HIGH (ref 15–41)
Albumin: 4.4 g/dL (ref 3.5–5.0)
Alkaline Phosphatase: 156 U/L — ABNORMAL HIGH (ref 38–126)
Anion gap: 11 (ref 5–15)
BUN: 19 mg/dL (ref 6–20)
CO2: 28 mmol/L (ref 22–32)
Calcium: 10.4 mg/dL — ABNORMAL HIGH (ref 8.9–10.3)
Chloride: 99 mmol/L (ref 98–111)
Creatinine, Ser: 1.06 mg/dL — ABNORMAL HIGH (ref 0.44–1.00)
GFR calc Af Amer: >60 mL/min (ref >60)
GFR calc non Af Amer: >60 mL/min (ref >60)
Glucose, Bld: 136 mg/dL — ABNORMAL HIGH (ref 70–99)
Potassium: 3 mmol/L — ABNORMAL LOW (ref 3.5–5.1)
Sodium: 138 mmol/L (ref 135–145)
Total Bilirubin: 1.4 mg/dL — ABNORMAL HIGH (ref 0.3–1.2)
Total Protein: 7.6 g/dL (ref 6.5–8.1)

## 2019-05-27 LAB — ABO/RH: ABO/RH(D): A NEG

## 2019-05-27 LAB — I-STAT BETA HCG BLOOD, ED (MC, WL, AP ONLY): I-stat hCG, quantitative: <5 m[IU]/mL (ref <5)

## 2019-05-27 LAB — POC SARS CORONAVIRUS 2 AG -  ED: SARS Coronavirus 2 Ag: POSITIVE — AB

## 2019-05-27 MED ORDER — POTASSIUM CHLORIDE CRYS ER 20 MEQ PO TBCR
40.0000 meq | EXTENDED_RELEASE_TABLET | Freq: Once | ORAL | Status: AC
  Administered 2019-05-27: 40 meq via ORAL
  Filled 2019-05-27: qty 2

## 2019-05-27 NOTE — ED Provider Notes (Signed)
[facility] DEPT Provider Note   CSN: [id number] Arrival date & time: 05/27/19  1815     History Chief Complaint  Patient presents with  . Fatigue  . Blood In Stools  . Generalized Body Aches  . Covid positive    Melanie [name] is a 44 y.o. female.  44 year old female who was diagnosed with Covid yesterday presents with body aches, weakness, diarrhea.  Patient noted some blood mixed in her stool.  Denied any actual frank blood per rectum.  Some sore throat.  Has had symptoms for about a week.  No treatment use prior to arrival has not had any dyspnea.  Patient had a video visit with her physician was told to come here for further evaluation.        Past Medical History:  Diagnosis Date  . Allergy   . Anemia   . Anxiety   . Back pain   . Constipation   . Depression   . Fatty liver   . GERD (gastroesophageal reflux disease)   . Hyperlipidemia   . Infertility, female   . Joint pain   . Kidney problem   . 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 Migraines    LIGHT SENSITIVITY , NOW ON IMITREX SINCE 08-02-2018,NOTICES IMPROVEMENT   . Multiple food allergies   . Muscle pain   . Numbness and tingling in both hands   . Pre-diabetes   . Shortness of breath    was due to a medication side effect, now off medication , SOB resolved   . Sleep apnea   . Stress   . Swelling of both lower extremities     Patient Active Problem List   Diagnosis Date Noted  . Blood in stool 05/27/2019  . COVID-19 05/27/2019  . Vitamin D deficiency 12/26/2018  . Class 3 severe obesity with serious comorbidity and body mass index (BMI) of 45.0 to 49.9 in adult ([place]) 12/26/2018  . Right tennis elbow 07/30/2018  . Cubital tunnel syndrome 07/30/2018  . Chronic pain of both feet 01/23/2018  . Bilateral calcaneal spurs 01/23/2018  . Other fatigue 05/23/2017  . Shortness of breath on exertion 05/23/2017  . Prediabetes 05/23/2017  . Liver fibrosis  05/23/2017  . Hyperlipidemia 04/16/2017  . OSA (obstructive sleep apnea) 11/23/2015  . NASH (nonalcoholic steatohepatitis) 08/27/2015  . Dependent edema 08/27/2015  . BMI 40.0-44.9, adult ([place]) 03/15/2015  . Hepatic fibrosis 06/08/2014  . Depression with anxiety 06/06/2013  . Perennial allergic rhinitis 06/06/2013    Past Surgical History:  Procedure Laterality Date  . ELBOW LIGAMENT RECONSTRUCTION Right 08/21/2018   Procedure: LATERAL COLLATERAL LIGAMENT REPAIR;  Surgeon: Hiram Gash, MD;  Location: [facility];  Service: Orthopedics;  Laterality: Right;  . FINGER SURGERY Left     index finger  . KNEE SURGERY Left   . LIVER BIOPSY N/A 05/15/2014   Procedure: LIVER BIOPSY;  Surgeon: Inda Castle, MD;  Location: [facility];  Service: Endoscopy;  Laterality: N/A;  ultrasound to mark the liver  . TENNIS ELBOW RELEASE/NIRSCHEL PROCEDURE Right 08/21/2018   Procedure: TENNIS ELBOW RELEASE/NIRSCHEL PROCEDURE;  Surgeon: Hiram Gash, MD;  Location: [facility];  Service: Orthopedics;  Laterality: Right;  . TONSILLECTOMY AND ADENOIDECTOMY  1978  . TYMPANOSTOMY TUBE PLACEMENT    . ULNAR NERVE TRANSPOSITION Right 08/21/2018   Procedure: ULNAR NERVE DECOMPRESSION/TRANSPOSITION;  Surgeon: Hiram Gash, MD;  Location: [facility];  Service: Orthopedics;  Laterality: Right;  .  wrist cyst Left      OB History    Gravida  0   Para  0   Term  0   Preterm  0   AB  0   Living  0     SAB  0   TAB  0   Ectopic  0   Multiple  0   Live Births  0           Family History  Problem Relation Age of Onset  . Diabetes Mother   . Liver disease Mother        ESLD- unknown etiology  . Obesity Mother   . Diabetes Maternal Grandmother   . Diabetes Paternal Grandmother   . Breast cancer Paternal Grandmother   . Stroke Maternal Grandfather   . Hyperlipidemia Father     Social History   Tobacco Use  . Smoking status: Never Smoker  . Smokeless tobacco: Never Used  Substance Use Topics  .  Alcohol use: No    Alcohol/week: 0.0 standard drinks  . Drug use: No    Home Medications Prior to Admission medications   Medication Sig Start Date End Date Taking? Authorizing Provider  albuterol (VENTOLIN HFA) 108 (90 Base) MCG/ACT inhaler Inhale 2 puffs into the lungs every 6 (six) hours as needed for wheezing or shortness of breath. 05/26/19   Maximiano Coss, NP  Alirocumab (PRALUENT) 150 MG/ML SOAJ Inject 1 Dose into the skin every 14 (fourteen) days. 11/05/18   Pixie Casino, MD  buPROPion (WELLBUTRIN SR) 150 MG 12 hr tablet Take 1 tablet (150 mg total) by mouth 2 (two) times daily. 01/15/19   Abby Potash, PA-C  busPIRone (BUSPAR) 15 MG tablet Take 1 tablet by mouth twice daily or as directed. 05/06/19   Abby Potash, PA-C  cetirizine (ZYRTEC) 10 MG tablet Take 10 mg by mouth at bedtime.     [provider]  chlorthalidone (HYGROTON) 25 MG tablet Take 1 tablet (25 mg total) by mouth daily. 04/16/19   Abby Potash, PA-C  citalopram (CELEXA) 40 MG tablet Take 1 tablet by mouth daily. 05/23/19   Forrest Moron, MD  fluticasone (FLONASE) 50 MCG/ACT nasal spray Place 2 sprays into both nostrils daily. Patient taking differently: Place 2 sprays into both nostrils daily as needed for allergies.  03/30/17   Harrison Mons, PA  guaiFENesin-dextromethorphan (ROBITUSSIN DM) 100-10 MG/5ML syrup Take 5 mLs by mouth every 4 (four) hours as needed for cough. 05/26/19   Maximiano Coss, NP  HYDROcodone-homatropine [facility]) 5-1.5 MG/5ML syrup Take 5 mLs by mouth every 8 (eight) hours as needed for cough. 05/26/19   Maximiano Coss, NP  levothyroxine (SYNTHROID) 88 MCG tablet Take 1 tablet by mouth daily. 04/23/19   Forrest Moron, MD  LORazepam (ATIVAN) 1 MG tablet Take 1 tablet (1 mg total) by mouth 2 (two) times daily as needed for anxiety. 04/06/18   Forrest Moron, MD  meclizine (ANTIVERT) 25 MG tablet TAKE ONE TABLET BY MOUTH THREE TIMES A DAY AS NEEDED FOR DIZZINESS Patient taking  differently: Take 25 mg by mouth 3 (three) times daily as needed for dizziness.  08/14/18   Wendie Agreste, MD  meloxicam (MOBIC) 15 MG tablet Take 15 mg by mouth daily as needed for pain.  07/23/18   [provider]  metFORMIN (GLUCOPHAGE) 500 MG tablet Take 1 tablet (500 mg total) by mouth 2 (two) times daily with a meal. 04/16/19   Abby Potash, PA-C  montelukast (  SINGULAIR) 10 MG tablet Take 1 tablet by mouth at bedtime. 05/23/19   Forrest Moron, MD  Multiple Vitamin (MULTIVITAMIN) tablet Take 1 tablet by mouth daily.    [provider]  Omega-3 Fatty Acids (FISH OIL PO) Take 1 capsule by mouth 2 (two) times a day.     [provider]  omeprazole (PRILOSEC) 20 MG capsule Take 1 capsule (20 mg total) by mouth daily. 03/04/19   Abby Potash, PA-C  ondansetron (ZOFRAN) 4 MG tablet Take 1 tablet (4 mg total) by mouth every 8 (eight) hours as needed for nausea or vomiting. 01/16/19   Lucrezia Starch, MD  SUMAtriptan (IMITREX) 50 MG tablet TAKE ONE TABLET BY MOUTH AT ONSET OF HEADACHE; MAY REPEAT ONE TABLET IN 2 HOURS IF NEEDED. Patient taking differently: Take 50 mg by mouth See admin instructions. Take 1 tablet at onset of headache; may repeat one tablet in 2 hours if needed. 08/14/18   Forrest Moron, MD  topiramate (TOPAMAX) 50 MG tablet 1/2 pill each bedtime x 1 week, then 1 pill nightly x 1 week, then 1-1/2 pills nightly x 1 week, then 2 pills nightly thereafter. 02/13/19   Star Age, MD  Ubrogepant (UBRELVY) 50 MG TABS Take 50 mg by mouth as needed (may repeat once after 2 hours). 08/15/18   Star Age, MD  Vitamin D, Ergocalciferol, (DRISDOL) 1.25 MG (50000 UNIT) CAPS capsule Take 1 capsule (50,000 Units total) by mouth every 7 (seven) days. Tuesday and Friday 05/06/19   Abby Potash, PA-C    Allergies    Welchol [colesevelam], Lidocaine, Relafen [nabumetone], Celebrex [celecoxib], and Sulfa antibiotics  Review of Systems   Review of Systems  All other  systems reviewed and are negative.   Physical Exam Updated Vital Signs BP 113/74   Pulse 74   Temp 98.4 F (36.9 C) (Oral)   Resp 18   LMP 05/12/2019   SpO2 98%   Physical Exam Vitals and nursing note reviewed.  Constitutional:      General: She is not in acute distress.    Appearance: Normal appearance. She is well-developed. She is not toxic-appearing.  HENT:     Head: Normocephalic and atraumatic.  Eyes:     General: Lids are normal.     Conjunctiva/sclera: Conjunctivae normal.     Pupils: Pupils are equal, round, and reactive to light.  Neck:     Thyroid: No thyroid mass.     Trachea: No tracheal deviation.  Cardiovascular:     Rate and Rhythm: Normal rate and regular rhythm.     Heart sounds: Normal heart sounds. No murmur. No gallop.   Pulmonary:     Effort: Pulmonary effort is normal. No respiratory distress.     Breath sounds: Normal breath sounds. No stridor. No decreased breath sounds, wheezing, rhonchi or rales.  Abdominal:     General: Bowel sounds are normal. There is no distension.     Palpations: Abdomen is soft.     Tenderness: There is no abdominal tenderness. There is no rebound.  Genitourinary:    Rectum: No anal fissure or external hemorrhoid.     Comments: Brown stool noted. Musculoskeletal:        General: No tenderness. Normal range of motion.     Cervical back: Normal range of motion and neck supple.  Skin:    General: Skin is warm and dry.     Findings: No abrasion or rash.  Neurological:     Mental Status:  She is alert and oriented to person, place, and time.     GCS: GCS eye subscore is 4. GCS verbal subscore is 5. GCS motor subscore is 6.     Cranial Nerves: No cranial nerve deficit.     Sensory: No sensory deficit.  Psychiatric:        Speech: Speech normal.        Behavior: Behavior normal.     ED Results / Procedures / Treatments   Labs (all labs ordered are listed, but only abnormal results are displayed) Labs Reviewed    COMPREHENSIVE METABOLIC PANEL - Abnormal; Notable for the following components:      Result Value   Potassium 3.0 (*)    Glucose, Bld 136 (*)    Creatinine, Ser 1.06 (*)    Calcium 10.4 (*)    AST 66 (*)    ALT 102 (*)    Alkaline Phosphatase 156 (*)    Total Bilirubin 1.4 (*)    All other components within normal limits  CBC  I-STAT BETA HCG BLOOD, ED ([facility], [facility], [facility] ONLY)  POC OCCULT BLOOD, ED  POC SARS CORONAVIRUS 2 AG -  ED  TYPE AND SCREEN    EKG None  Radiology No results found.  Procedures Procedures (including critical care time)  Medications Ordered in ED Medications - No data to display  ED Course  I have reviewed the triage vital signs and the nursing notes.  Pertinent labs & imaging results that were available during my care of the patient were reviewed by me and considered in my medical decision making (see chart for details).    MDM Rules/Calculators/A&P                      Potassium was low at 3.0 and patient given 40 mg of potassium orally. Given iv fluids and feels better Stable for d/c Final Clinical Impression(s) / ED Diagnoses Final diagnoses:  None    Rx / DC Orders ED Discharge Orders    None       Lacretia Leigh, MD 05/30/19 1341

## 2019-05-27 NOTE — Progress Notes (Signed)
Telemedicine Encounter- SOAP NOTE Established Patient  This telephone encounter was conducted with the patient's (or proxy's) verbal consent via audio telecommunications: YES  Patient was instructed to have this encounter in a suitably private space; and to only have persons present to whom they give permission to participate. In addition, patient identity was confirmed by use of name plus two identifiers (DOB and address).  I discussed the limitations, risks, security and privacy concerns of performing an evaluation and management service by telephone and the availability of in person appointments. I also discussed with the patient that there may be a patient responsible charge related to this service. The patient expressed understanding and agreed to proceed.  I spent a total of 12 minutes talking with the patient or their proxy.  Chief Complaint  Patient presents with  . Blood In Stools    patient states since her last visit 05/26/2019 she has learned that she that she has tested positive for Covid-19 and is having a great amount of blood in her stools.     Subjective   Melanie [name] is a 44 y.o. established patient. Telephone visit today for covid positive and rectal bleeding  HPI Pt had telemed yesterday with covid symptoms Sought testing, resulted positive Still having same symptoms as yesterday - however, noted blood in stool Notes significant dark blood in stool at 4am and 2pm today - loose stools all day, has had 1-2 instances of stool without blood Feeling lightheaded, dizzy.Amaryllis Dyke not worsening. No LOC or confusion.  Patient Active Problem List   Diagnosis Date Noted  . Vitamin D deficiency 12/26/2018  . Class 3 severe obesity with serious comorbidity and body mass index (BMI) of 45.0 to 49.9 in adult ([place]) 12/26/2018  . Right tennis elbow 07/30/2018  . Cubital tunnel syndrome 07/30/2018  . Chronic pain of both feet 01/23/2018  . Bilateral calcaneal spurs 01/23/2018    . Other fatigue 05/23/2017  . Shortness of breath on exertion 05/23/2017  . Prediabetes 05/23/2017  . Liver fibrosis 05/23/2017  . Hyperlipidemia 04/16/2017  . OSA (obstructive sleep apnea) 11/23/2015  . NASH (nonalcoholic steatohepatitis) 08/27/2015  . Dependent edema 08/27/2015  . BMI 40.0-44.9, adult ([place]) 03/15/2015  . Hepatic fibrosis 06/08/2014  . Depression with anxiety 06/06/2013  . Perennial allergic rhinitis 06/06/2013    Past Medical History:  Diagnosis Date  . Allergy   . Anemia   . Anxiety   . Back pain   . Constipation   . Depression   . Fatty liver   . GERD (gastroesophageal reflux disease)   . Hyperlipidemia   . Infertility, female   . Joint pain   . Kidney problem   . 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 Migraines    LIGHT SENSITIVITY , NOW ON IMITREX SINCE 08-02-2018,NOTICES IMPROVEMENT   . Multiple food allergies   . Muscle pain   . Numbness and tingling in both hands   . Pre-diabetes   . Shortness of breath    was due to a medication side effect, now off medication , SOB resolved   . Sleep apnea   . Stress   . Swelling of both lower extremities     Current Outpatient Medications  Medication Sig Dispense Refill  . albuterol (VENTOLIN HFA) 108 (90 Base) MCG/ACT inhaler Inhale 2 puffs into the lungs every 6 (six) hours as needed for wheezing or shortness of breath. 18 g 0  . Alirocumab ([place])  150 MG/ML SOAJ Inject 1 Dose into the skin every 14 (fourteen) days. 2 pen 11  . buPROPion (WELLBUTRIN SR) 150 MG 12 hr tablet Take 1 tablet (150 mg total) by mouth 2 (two) times daily. 180 tablet 0  . busPIRone (BUSPAR) 15 MG tablet Take 1 tablet by mouth twice daily or as directed. 60 tablet 0  . cetirizine (ZYRTEC) 10 MG tablet Take 10 mg by mouth at bedtime.     . chlorthalidone (HYGROTON) 25 MG tablet Take 1 tablet (25 mg total) by mouth daily. 180 tablet 0  . citalopram (CELEXA) 40 MG tablet Take 1 tablet by mouth daily. 30  tablet 0  . fluticasone (FLONASE) 50 MCG/ACT nasal spray Place 2 sprays into both nostrils daily. (Patient taking differently: Place 2 sprays into both nostrils daily as needed for allergies. ) 48 g 3  . guaiFENesin-dextromethorphan (ROBITUSSIN DM) 100-10 MG/5ML syrup Take 5 mLs by mouth every 4 (four) hours as needed for cough. 118 mL 0  . HYDROcodone-homatropine (HYCODAN) 5-1.5 MG/5ML syrup Take 5 mLs by mouth every 8 (eight) hours as needed for cough. 120 mL 0  . levothyroxine (SYNTHROID) 88 MCG tablet Take 1 tablet by mouth daily. 30 tablet 0  . LORazepam (ATIVAN) 1 MG tablet Take 1 tablet (1 mg total) by mouth 2 (two) times daily as needed for anxiety. 20 tablet 0  . meclizine (ANTIVERT) 25 MG tablet TAKE ONE TABLET BY MOUTH THREE TIMES A DAY AS NEEDED FOR DIZZINESS (Patient taking differently: Take 25 mg by mouth 3 (three) times daily as needed for dizziness. ) 30 tablet 0  . meloxicam (MOBIC) 15 MG tablet Take 15 mg by mouth daily as needed for pain.     . metFORMIN (GLUCOPHAGE) 500 MG tablet Take 1 tablet (500 mg total) by mouth 2 (two) times daily with a meal. 180 tablet 0  . montelukast (SINGULAIR) 10 MG tablet Take 1 tablet by mouth at bedtime. 30 tablet 0  . Multiple Vitamin (MULTIVITAMIN) tablet Take 1 tablet by mouth daily.    . Omega-3 Fatty Acids (FISH OIL PO) Take 1 capsule by mouth 2 (two) times a day.     Marland Kitchen omeprazole (PRILOSEC) 20 MG capsule Take 1 capsule (20 mg total) by mouth daily. 90 capsule 0  . ondansetron (ZOFRAN) 4 MG tablet Take 1 tablet (4 mg total) by mouth every 8 (eight) hours as needed for nausea or vomiting. 12 tablet 0  . SUMAtriptan (IMITREX) 50 MG tablet TAKE ONE TABLET BY MOUTH AT ONSET OF HEADACHE; MAY REPEAT ONE TABLET IN 2 HOURS IF NEEDED. (Patient taking differently: Take 50 mg by mouth See admin instructions. Take 1 tablet at onset of headache; may repeat one tablet in 2 hours if needed.) 10 tablet 0  . topiramate (TOPAMAX) 50 MG tablet 1/2 pill each  bedtime x 1 week, then 1 pill nightly x 1 week, then 1-1/2 pills nightly x 1 week, then 2 pills nightly thereafter. 60 tablet 5  . Ubrogepant (UBRELVY) 50 MG TABS Take 50 mg by mouth as needed (may repeat once after 2 hours). 20 tablet 3  . Vitamin D, Ergocalciferol, (DRISDOL) 1.25 MG (50000 UNIT) CAPS capsule Take 1 capsule (50,000 Units total) by mouth every 7 (seven) days. Tuesday and Friday 10 capsule 0   No current facility-administered medications for this visit.    Allergies  Allergen Reactions  . Welchol [Colesevelam] Shortness Of Breath  . Lidocaine Other (See Comments)    Flu like sx's  .  Relafen [Nabumetone] Other (See Comments)    migraine  . Celebrex [Celecoxib] Rash  . Sulfa Antibiotics Rash    Social History   Socioeconomic History  . Marital status: Married    Spouse name: [name]  . Number of children: 5  . Years of education: in [employer] program  . Highest education level: Bachelor's degree (e.g., BA, AB, BS)  Occupational History  . Occupation: Product manager: [employer]    Comment: ESL  Tobacco Use  . Smoking status: Never Smoker  . Smokeless tobacco: Never Used  Substance and Sexual Activity  . Alcohol use: No    Alcohol/week: 0.0 standard drinks  . Drug use: No  . Sexual activity: Yes    Partners: Male    Birth control/protection: Post-menopausal  Other Topics Concern  . Not on file  Social History Narrative   Lives with her husband and their 5 adpoted children.    Adopted 2 sets of siblings, all with psychiatric diagnoses (ADHD, Asperger's, ODD, PTSD, depression, bipolar disorder).   Her husband has Asperger's, and works in disability services.   Occasionally drinks coke or pepsi    Social Determinants of Health   Financial Resource Strain:   . Difficulty of Paying Living Expenses:   Food Insecurity:   . Worried About Charity fundraiser in the Last Year:   . Arboriculturist in the Last Year:   Transportation Needs:   . Consulting civil engineer (Medical):   Marland Kitchen Lack of Transportation (Non-Medical):   Physical Activity:   . Days of Exercise per Week:   . Minutes of Exercise per Session:   Stress:   . Feeling of Stress :   Social Connections:   . Frequency of Communication with Friends and Family:   . Frequency of Social Gatherings with Friends and Family:   . Attends Religious Services:   . Active Member of Clubs or Organizations:   . Attends Archivist Meetings:   Marland Kitchen Marital Status:   Intimate Partner Violence:   . Fear of Current or Ex-Partner:   . Emotionally Abused:   Marland Kitchen Physically Abused:   . Sexually Abused:     Review of Systems  Constitutional: Negative.   HENT: Negative.   Eyes: Negative.   Respiratory: Positive for cough, shortness of breath and wheezing. Negative for hemoptysis and sputum production.   Cardiovascular: Negative.   Gastrointestinal: Positive for blood in stool. Negative for abdominal pain, constipation, diarrhea, heartburn, melena, nausea and vomiting.  Genitourinary: Negative.   Musculoskeletal: Negative.   Skin: Negative.   Neurological: Negative.   Endo/Heme/Allergies: Negative.   Psychiatric/Behavioral: Negative.   All other systems reviewed and are negative.   Objective   Vitals as reported by the patient: There were no vitals filed for this visit.  Shaunessy was seen today for blood in stools.  Diagnoses and all orders for this visit:  [id number]  Blood in stool   PLAN  With significant portions of blood in stool, pt needs to seek emergency treatment. I am concerned that with blood loss in combination with covid, her risk for acute desaturation is beyond what home care and supportive care can handle.  She will proceed [facility]  Patient encouraged to call clinic with any questions, comments, or concerns.  I discussed the assessment and treatment plan with the patient. The patient was provided an opportunity to ask questions and all were answered. The  patient agreed with the  plan and demonstrated an understanding of the instructions.   The patient was advised to call back or seek an in-person evaluation if the symptoms worsen or if the condition fails to improve as anticipated.  I provided 12 minutes of non-face-to-face time during this encounter.  Maximiano Coss, NP  Primary Care at [facility]

## 2019-05-27 NOTE — ED Notes (Signed)
An After Visit Summary was printed and given to the patient. Discharge instructions given and no further questions at this time.  

## 2019-05-27 NOTE — ED Triage Notes (Signed)
Pt reports about week ago started out with cold symptoms. Her Covid results came back positive today. Reports PCP advised to go be seen in person due to cough, SOB, fatigue, body aches, dizziness, and blood in stools. Blood in stool started today that is darker in color. Denies being on blood thinners.

## 2019-05-27 NOTE — Telephone Encounter (Signed)
Pt c/o blood in stool (diarrhea) this am at 0400. Pt describes dark red blood mixed in the diarrhea. Pt stated that their was a 'significant amount on toilet paper when she wiped. Pt estimated "tablespoons" of blood. Water in toilet was not pink or red.  Pt stated she has had diarrhea x 4 in the past 24 hours.   Pt denies abdominal pain vomiting, fever. Pt stated that she just found out that she is positive for Covid this am. Pt stated she experienced mild dizziness when in the bathroom. She stated she had taken cough syrup with codeine prior to getting up and felt sleep.  Pt has h/o NASH.  Care advice given and pt verbalized understanding. Call transferred to office for appt.     Reason for Disposition . MILD rectal bleeding (more than just a few drops or streaks)  Additional Information . Commented on: Known cirrhosis of the liver (or history of liver failure or ascites)    NASH  Answer Assessment - Initial Assessment Questions 1. APPEARANCE of BLOOD: "What color is it?" "Is it passed separately, on the surface of the stool, or mixed in with the stool?"      Dark red- mixed with stool and with "significant amount on toilet" 2. AMOUNT: "How much blood was passed?"      tablespoons 3. FREQUENCY: "How many times has blood been passed with the stools?"      1 4. ONSET: "When was the blood first seen in the stools?" (Days or weeks)      0400 this am  5. DIARRHEA: "Is there also some diarrhea?" If so, ask: "How many diarrhea stools were passed in past 24 hours?"      Yes-4-5 times 6. CONSTIPATION: "Do you have constipation?" If so, "How bad is it?"     no 7. RECURRENT SYMPTOMS: "Have you had blood in your stools before?" If so, ask: "When was the last time?" and "What happened that time?"      No- 8. BLOOD THINNERS: "Do you take any blood thinners?" (e.g., Coumadin/warfarin, Pradaxa/dabigatran, aspirin)     no 9. OTHER SYMPTOMS: "Do you have any other symptoms?"  (e.g., abdominal pain,  vomiting, dizziness, fever)     Mild dizziness (had taken cough syrup with codeine just before)  10. PREGNANCY: "Is there any chance you are pregnant?" "When was your last menstrual period?"       No-05/12/19  Protocols used: RECTAL BLEEDING-A-AH

## 2019-05-27 NOTE — Patient Instructions (Signed)
° ° ° °  If you have lab work done today you will be contacted with your lab results within the next 2 weeks.  If you have not heard from us then please contact us. The fastest way to get your results is to register for My Chart. ° ° °IF you received an x-ray today, you will receive an invoice from [facility]. Please contact [facility] at [phone] with questions or concerns regarding your invoice.  ° °IF you received labwork today, you will receive an invoice from [facility]. Please contact [facility] at [phone] with questions or concerns regarding your invoice.  ° °Our billing staff will not be able to assist you with questions regarding bills from these companies. ° °You will be contacted with the lab results as soon as they are available. The fastest way to get your results is to activate your My Chart account. Instructions are located on the last page of this paperwork. If you have not heard from us regarding the results in 2 weeks, please contact this office. °  ° ° ° °

## 2019-05-28 ENCOUNTER — Other Ambulatory Visit: Payer: Self-pay | Admitting: Physician Assistant

## 2019-05-28 ENCOUNTER — Ambulatory Visit (HOSPITAL_COMMUNITY)
Admission: RE | Admit: 2019-05-28 | Discharge: 2019-05-28 | Disposition: A | Discharge status: Home / Self Care | Payer: BC Managed Care – PPO | Source: Ambulatory Visit | Attending: Pulmonary Disease | Admitting: Pulmonary Disease

## 2019-05-28 ENCOUNTER — Telehealth: Payer: BC Managed Care – PPO | Admitting: Registered Nurse

## 2019-05-28 DIAGNOSIS — U071 COVID-19: Secondary | ICD-10-CM | POA: Diagnosis present

## 2019-05-28 DIAGNOSIS — Z6841 Body Mass Index (BMI) 40.0 and over, adult: Secondary | ICD-10-CM

## 2019-05-28 DIAGNOSIS — E119 Type 2 diabetes mellitus without complications: Secondary | ICD-10-CM

## 2019-05-28 DIAGNOSIS — G4733 Obstructive sleep apnea (adult) (pediatric): Secondary | ICD-10-CM

## 2019-05-28 MED ORDER — ALBUTEROL SULFATE HFA 108 (90 BASE) MCG/ACT IN AERS: 2.0000 | INHALATION_SPRAY | Freq: Once | RESPIRATORY_TRACT | Status: DC | PRN

## 2019-05-28 MED ORDER — DIPHENHYDRAMINE HCL 50 MG/ML IJ SOLN: 50.0000 mg | Freq: Once | INTRAMUSCULAR | Status: DC | PRN

## 2019-05-28 MED ORDER — EPINEPHRINE 0.3 MG/0.3ML IJ SOAJ: 0.3000 mg | Freq: Once | INTRAMUSCULAR | Status: DC | PRN

## 2019-05-28 MED ORDER — SODIUM CHLORIDE 0.9 % IV SOLN
INTRAVENOUS | Status: DC | PRN
  Administered 2019-05-28: 250 mL via INTRAVENOUS

## 2019-05-28 MED ORDER — SODIUM CHLORIDE 0.9 % IV SOLN
700.0000 mg | Freq: Once | INTRAVENOUS | Status: AC
  Administered 2019-05-28: 700 mg via INTRAVENOUS
  Filled 2019-05-28: qty 700

## 2019-05-28 MED ORDER — METHYLPREDNISOLONE SODIUM SUCC 125 MG IJ SOLR: 125.0000 mg | Freq: Once | INTRAMUSCULAR | Status: DC | PRN

## 2019-05-28 MED ORDER — FAMOTIDINE IN NACL 20-0.9 MG/50ML-% IV SOLN: 20.0000 mg | Freq: Once | INTRAVENOUS | Status: DC | PRN

## 2019-05-28 NOTE — Progress Notes (Signed)
  I connected by phone with Melanie [name] on 05/28/2019 at 9:13 AM to discuss the potential use of an new treatment for mild to moderate COVID-19 viral infection in non-hospitalized patients.  This patient is a 44 y.o. female that meets the FDA criteria for Emergency Use Authorization of bamlanivimab or casirivimab\imdevimab.  Has a (+) direct SARS-CoV-2 viral test result  Has mild or moderate COVID-19   Is ? 44 years of age and weighs ? 40 kg  Is NOT hospitalized due to COVID-19  Is NOT requiring oxygen therapy or requiring an increase in baseline oxygen flow rate due to COVID-19  Is within 10 days of symptom onset  Has at least one of the high risk factor(s) for progression to severe COVID-19 and/or hospitalization as defined in EUA.  Specific high risk criteria : BMI >/= 35   I have spoken and communicated the following to the patient or parent/caregiver:  1. FDA has authorized the emergency use of bamlanivimab and casirivimab\imdevimab for the treatment of mild to moderate COVID-19 in adults and pediatric patients with positive results of direct SARS-CoV-2 viral testing who are 57 years of age and older weighing at least 40 kg, and who are at high risk for progressing to severe COVID-19 and/or hospitalization.  2. The significant known and potential risks and benefits of bamlanivimab and casirivimab\imdevimab, and the extent to which such potential risks and benefits are unknown.  3. Information on available alternative treatments and the risks and benefits of those alternatives, including clinical trials.  4. Patients treated with bamlanivimab and casirivimab\imdevimab should continue to self-isolate and use infection control measures (e.g., wear mask, isolate, social distance, avoid sharing personal items, clean and disinfect "high touch" surfaces, and frequent handwashing) according to CDC guidelines.   5. The patient or parent/caregiver has the option to accept or refuse  bamlanivimab or casirivimab\imdevimab .  After reviewing this information with the patient, The patient agreed to proceed with receiving the bamlanimivab infusion and will be provided a copy of the Fact sheet prior to receiving the infusion.[name] 05/28/2019 9:13 AM

## 2019-05-28 NOTE — Discharge Instructions (Signed)

## 2019-05-28 NOTE — Progress Notes (Signed)
  Diagnosis: COVID-19  Physician: Dr. Joya Gaskins  Procedure: Covid Infusion Clinic Med: bamlanivimab infusion - Provided patient with bamlanimivab fact sheet for patients, parents and caregivers prior to infusion.  Complications: No immediate complications noted.  Discharge: Discharged home   Acquanetta Chain 05/28/2019

## 2019-06-04 ENCOUNTER — Encounter: Payer: Self-pay | Admitting: Registered Nurse

## 2019-06-04 ENCOUNTER — Telehealth (INDEPENDENT_AMBULATORY_CARE_PROVIDER_SITE_OTHER): Payer: BC Managed Care – PPO | Admitting: Registered Nurse

## 2019-06-04 ENCOUNTER — Other Ambulatory Visit: Payer: Self-pay

## 2019-06-04 VITALS — BP 143/89

## 2019-06-04 DIAGNOSIS — R05 Cough: Secondary | ICD-10-CM | POA: Diagnosis not present

## 2019-06-04 DIAGNOSIS — R059 Cough, unspecified: Secondary | ICD-10-CM

## 2019-06-04 NOTE — Patient Instructions (Signed)
° ° ° °  If you have lab work done today you will be contacted with your lab results within the next 2 weeks.  If you have not heard from us then please contact us. The fastest way to get your results is to register for My Chart. ° ° °IF you received an x-ray today, you will receive an invoice from [facility]. Please contact [facility] at [phone] with questions or concerns regarding your invoice.  ° °IF you received labwork today, you will receive an invoice from [facility]. Please contact [facility] at [phone] with questions or concerns regarding your invoice.  ° °Our billing staff will not be able to assist you with questions regarding bills from these companies. ° °You will be contacted with the lab results as soon as they are available. The fastest way to get your results is to activate your My Chart account. Instructions are located on the last page of this paperwork. If you have not heard from us regarding the results in 2 weeks, please contact this office. °  ° ° ° °

## 2019-06-05 ENCOUNTER — Telehealth: Payer: Self-pay | Admitting: Family Medicine

## 2019-06-05 MED ORDER — PREDNISONE 20 MG PO TABS: ORAL_TABLET | ORAL | 0 refills | Status: DC

## 2019-06-05 NOTE — Telephone Encounter (Signed)
Pt had a virtual appt with Orland Mustard yesterday on 3/24 and say during the appt they discussed proscribing her a prednisone. Pt called Pharmacy and they haven't received it yet.   [name] [facility] [address], [place]

## 2019-06-16 ENCOUNTER — Telehealth: Payer: Self-pay

## 2019-06-16 ENCOUNTER — Encounter: Payer: Self-pay | Admitting: Family Medicine

## 2019-06-16 DIAGNOSIS — F3289 Other specified depressive episodes: Secondary | ICD-10-CM

## 2019-06-16 MED ORDER — BUPROPION HCL ER (SR) 150 MG PO TB12: 150.0000 mg | ORAL_TABLET | Freq: Two times a day (BID) | ORAL | 1 refills | Status: DC

## 2019-06-16 NOTE — Telephone Encounter (Signed)
Is out of Wellbutrin originally being rx by another dr she states that Dr would like you to take over this rx. Is this okay?

## 2019-06-16 NOTE — Addendum Note (Signed)
Addended by: Delia Chimes A on: 06/16/2019 04:17 PM   Modules accepted: Orders

## 2019-06-16 NOTE — Telephone Encounter (Signed)
Medication sent in. 

## 2019-06-22 ENCOUNTER — Other Ambulatory Visit (INDEPENDENT_AMBULATORY_CARE_PROVIDER_SITE_OTHER): Payer: Self-pay | Admitting: Physician Assistant

## 2019-06-22 ENCOUNTER — Other Ambulatory Visit: Payer: Self-pay | Admitting: Family Medicine

## 2019-06-22 DIAGNOSIS — F419 Anxiety disorder, unspecified: Secondary | ICD-10-CM

## 2019-06-22 DIAGNOSIS — R7303 Prediabetes: Secondary | ICD-10-CM

## 2019-06-22 DIAGNOSIS — T7840XS Allergy, unspecified, sequela: Secondary | ICD-10-CM

## 2019-06-22 NOTE — Telephone Encounter (Signed)
Requested Prescriptions  Pending Prescriptions Disp Refills  . montelukast (SINGULAIR) 10 MG tablet [Pharmacy Med Name: Montelukast Sodium 37m Tablet] 90 tablet 3    Sig: Take 1 tablet by mouth at bedtime.     Pulmonology:  Leukotriene Inhibitors Passed - 06/22/2019  6:12 AM      Passed - Valid encounter within last 12 months    Recent Outpatient Visits          2 weeks ago Cough   [facility]   3 weeks ago COVID-19   [facility] [place] [facility]   3 weeks ago Shortness of breath   [facility]   5 months ago Hematuria, unspecified type   [facility]   10 months ago Hypercalcemia   [facility]

## 2019-06-22 NOTE — Telephone Encounter (Signed)
Requested Prescriptions  Pending Prescriptions Disp Refills  . citalopram (CELEXA) 40 MG tablet [Pharmacy Med Name: Citalopram Hydrobromide 26m Tablet] 30 tablet 0    Sig: Take 1 tablet by mouth daily.     Psychiatry:  Antidepressants - SSRI Passed - 06/22/2019  5:18 AM      Passed - Completed PHQ-2 or PHQ-9 in the last 360 days.      Passed - Valid encounter within last 6 months    Recent Outpatient Visits          2 weeks ago Cough   [facility] [place]   3 weeks ago COVID-19   [facility]   3 weeks ago Shortness of breath   [facility] [facility]   5 months ago Hematuria, unspecified type   [facility]   10 months ago Hypercalcemia   [facility]

## 2019-06-25 ENCOUNTER — Other Ambulatory Visit: Payer: Self-pay | Admitting: Neurology

## 2019-06-25 MED ORDER — TOPIRAMATE 100 MG PO TABS: 100.0000 mg | ORAL_TABLET | Freq: Every day | ORAL | 1 refills | Status: DC

## 2019-06-26 ENCOUNTER — Encounter (INDEPENDENT_AMBULATORY_CARE_PROVIDER_SITE_OTHER): Payer: Self-pay | Admitting: Physician Assistant

## 2019-06-26 ENCOUNTER — Ambulatory Visit (INDEPENDENT_AMBULATORY_CARE_PROVIDER_SITE_OTHER): Payer: BC Managed Care – PPO | Admitting: Physician Assistant

## 2019-06-26 ENCOUNTER — Other Ambulatory Visit: Payer: Self-pay

## 2019-06-26 VITALS — BP 111/70 | HR 68 | Temp 97.7°F | Ht 68.0 in | Wt 319.0 lb

## 2019-06-26 DIAGNOSIS — K219 Gastro-esophageal reflux disease without esophagitis: Secondary | ICD-10-CM

## 2019-06-26 DIAGNOSIS — Z9189 Other specified personal risk factors, not elsewhere classified: Secondary | ICD-10-CM | POA: Diagnosis not present

## 2019-06-26 DIAGNOSIS — I10 Essential (primary) hypertension: Secondary | ICD-10-CM | POA: Diagnosis not present

## 2019-06-26 DIAGNOSIS — Z6841 Body Mass Index (BMI) 40.0 and over, adult: Secondary | ICD-10-CM

## 2019-06-26 DIAGNOSIS — R7303 Prediabetes: Secondary | ICD-10-CM

## 2019-06-26 MED ORDER — OMEPRAZOLE 20 MG PO CPDR: 20.0000 mg | DELAYED_RELEASE_CAPSULE | Freq: Every day | ORAL | 0 refills | Status: DC

## 2019-06-26 MED ORDER — METFORMIN HCL 500 MG PO TABS: 500.0000 mg | ORAL_TABLET | Freq: Two times a day (BID) | ORAL | 0 refills | Status: DC

## 2019-06-26 MED ORDER — CHLORTHALIDONE 25 MG PO TABS: 25.0000 mg | ORAL_TABLET | Freq: Every day | ORAL | 0 refills | Status: DC

## 2019-06-26 NOTE — Progress Notes (Signed)
Chief Complaint:   OBESITY Melanie [name] is here to discuss her progress with her obesity treatment plan along with follow-up of her obesity related diagnoses. Melanie [name] is on the Category 2 Plan and states she is following her eating plan approximately 10% of the time. Melanie [name] states she is exercising for 0 minutes 0 times per week.  Today's visit was #: 65 Starting weight: 338 lbs Starting date: 05/23/2017 Today's weight: 319 lbs Today's date: 06/26/2019 Total lbs lost to date: 19 lbs Total lbs lost since last in-office visit: 4 lbs  Interim History: Melanie [name] reports that she got COVID in mid-March and is still having some residual fatigue.  She states she could not eat much at all when she was sick.  She is ready to get back on plan.  Subjective:   1. Prediabetes Melanie [name] has a diagnosis of prediabetes based on her elevated HgA1c and was informed this puts her at greater risk of developing diabetes. She continues to work on diet and exercise to decrease her risk of diabetes. She denies hypoglycemia.  She is taking metformin.  No nausea, vomiting, or diarrhea.  Lab Results  Component Value Date   HGBA1C 5.6 04/16/2019   Lab Results  Component Value Date   INSULIN 72.8 (H) 04/16/2019   INSULIN 46.3 (H) 12/24/2018   INSULIN 39.2 (H) 07/25/2018   INSULIN 30.4 (H) 05/01/2018   INSULIN 39.0 (H) 12/13/2017   2. Gastroesophageal reflux disease, unspecified whether esophagitis present Gretta is taking omeprazole.  No blood in stool or black stools.  No reflux symptoms.  3. Essential hypertension She is taking chlorthalidone.  No chest pain.  Blood pressure is normal.  4. At risk for diabetes mellitus Melanie [name] is at higher than average risk for developing diabetes due to her obesity.   Assessment/Plan:   1. Prediabetes Melanie [name] will continue to work on weight loss, exercise, and decreasing simple carbohydrates to help decrease the risk of diabetes.  - metFORMIN (GLUCOPHAGE) 500 MG tablet; Take 1 tablet (500  mg total) by mouth 2 (two) times daily with a meal.  Dispense: 180 tablet; Refill: 0  2. Gastroesophageal reflux disease, unspecified whether esophagitis present Intensive lifestyle modifications are the first line treatment for this issue. We discussed several lifestyle modifications today and she will continue to work on diet, exercise and weight loss efforts. Orders and follow up as documented in patient record.   Counseling . If a person has gastroesophageal reflux disease (GERD), food and stomach acid move back up into the esophagus and cause symptoms or problems such as damage to the esophagus. . Anti-reflux measures include: raising the head of the bed, avoiding tight clothing or belts, avoiding eating late at night, not lying down shortly after mealtime, and achieving weight loss. . Avoid ASA, NSAID's, caffeine, alcohol, and tobacco.  . OTC Pepcid and/or Tums are often very helpful for as needed use.  Marland Kitchen However, for persisting chronic or daily symptoms, stronger medications like Omeprazole may be needed. . You may need to avoid foods and drinks such as: ? Coffee and tea (with or without caffeine). ? Drinks that contain alcohol. ? Energy drinks and sports drinks. ? Bubbly (carbonated) drinks or sodas. ? Chocolate and cocoa. ? Peppermint and mint flavorings. ? Garlic and onions. ? Horseradish. ? Spicy and acidic foods. These include peppers, chili powder, curry powder, vinegar, hot sauces, and BBQ sauce. ? Citrus fruit juices and citrus fruits, such as oranges, lemons, and limes. ? Tomato-based foods. These include  red sauce, chili, salsa, and pizza with red sauce. ? Fried and fatty foods. These include donuts, french fries, potato chips, and high-fat dressings. ? High-fat meats. These include hot dogs, rib eye steak, sausage, ham, and bacon. - omeprazole (PRILOSEC) 20 MG capsule; Take 1 capsule (20 mg total) by mouth daily.  Dispense: 90 capsule; Refill: 0  3. Essential  hypertension Adiya is working on healthy weight loss and exercise to improve blood pressure control. We will watch for signs of hypotension as she continues her lifestyle modifications. - chlorthalidone (HYGROTON) 25 MG tablet; Take 1 tablet (25 mg total) by mouth daily.  Dispense: 90 tablet; Refill: 0  4. At risk for diabetes mellitus Karem was given approximately 15 minutes of diabetes education and counseling today. We discussed intensive lifestyle modifications today with an emphasis on weight loss as well as increasing exercise and decreasing simple carbohydrates in her diet. We also reviewed medication options with an emphasis on risk versus benefit of those discussed.   Repetitive spaced learning was employed today to elicit superior memory formation and behavioral change.  5. Class 3 severe obesity with serious comorbidity and body mass index (BMI) of 45.0 to 49.9 in adult, unspecified obesity type (HCC) Melanie [name] is currently in the action stage of change. As such, her goal is to continue with weight loss efforts. She has agreed to the Category 2 Plan +200 protein calories.   Exercise goals: As is.  Behavioral modification strategies: increasing lean protein intake, no skipping meals and meal planning and cooking strategies.  Melanie [name] has agreed to follow-up with our clinic in 3 weeks. She was informed of the importance of frequent follow-up visits to maximize her success with intensive lifestyle modifications for her multiple health conditions.   Objective:   Blood pressure 111/70, pulse 68, temperature 97.7 F (36.5 C), temperature source Oral, height 5' 8"  (1.727 m), weight (!) 319 lb (144.7 kg), last menstrual period 05/12/2019, SpO2 98 %. Body mass index is 48.5 kg/m.  General: Cooperative, alert, well developed, in no acute distress. HEENT: Conjunctivae and lids unremarkable. Cardiovascular: Regular rhythm.  Lungs: Normal work of breathing. Neurologic: No focal deficits.   Lab  Results  Component Value Date   CREATININE 1.06 (H) 05/27/2019   BUN 19 05/27/2019   NA 138 05/27/2019   K 3.0 (L) 05/27/2019   CL 99 05/27/2019   CO2 28 05/27/2019   Lab Results  Component Value Date   ALT 102 (H) 05/27/2019   AST 66 (H) 05/27/2019   ALKPHOS 156 (H) 05/27/2019   BILITOT 1.4 (H) 05/27/2019   Lab Results  Component Value Date   HGBA1C 5.6 04/16/2019   HGBA1C 5.6 12/24/2018   HGBA1C 5.5 07/25/2018   HGBA1C 5.5 05/01/2018   HGBA1C 5.6 12/13/2017   Lab Results  Component Value Date   INSULIN 72.8 (H) 04/16/2019   INSULIN 46.3 (H) 12/24/2018   INSULIN 39.2 (H) 07/25/2018   INSULIN 30.4 (H) 05/01/2018   INSULIN 39.0 (H) 12/13/2017   Lab Results  Component Value Date   TSH 3.950 12/24/2018   Lab Results  Component Value Date   CHOL 220 (H) 04/16/2019   HDL 63 04/16/2019   LDLCALC 122 (H) 04/16/2019   TRIG 200 (H) 04/16/2019   CHOLHDL 7.0 (H) 07/26/2018   Lab Results  Component Value Date   WBC 4.7 05/27/2019   HGB 14.5 05/27/2019   HCT 42.9 05/27/2019   MCV 91.5 05/27/2019   PLT 211 05/27/2019   Lab Results  Component Value Date   IRON 95 06/27/2013   TIBC 371 06/27/2013   FERRITIN 401 (H) 06/27/2013   Attestation Statements:   Reviewed by clinician on day of visit: allergies, medications, problem list, medical history, surgical history, family history, social history, and previous encounter notes.  I, Water quality scientist, CMA, am acting as Location manager for [employer], PA-C.  I have reviewed the above documentation for accuracy and completeness, and I agree with the above. -  [place]

## 2019-06-27 NOTE — Progress Notes (Signed)
Telemedicine Encounter- SOAP NOTE Established Patient  This telephone encounter was conducted with the patient's (or proxy's) verbal consent via audio telecommunications: yes  Patient was instructed to have this encounter in a suitably private space; and to only have persons present to whom they give permission to participate. In addition, patient identity was confirmed by use of name plus two identifiers (DOB and address).  I discussed the limitations, risks, security and privacy concerns of performing an evaluation and management service by telephone and the availability of in person appointments. I also discussed with the patient that there may be a patient responsible charge related to this service. The patient expressed understanding and agreed to proceed.  I spent a total of 12 minutes talking with the patient or their proxy.  Chief Complaint  Patient presents with  . Follow-up    patient states she has some lingering covid symptoms like fatigue , bodyaches , cough , and headache. Per patient she has been taking tylenol , and wants to know if its something besides tylenol that she should be taking    Subjective   Melanie [name] is a 44 y.o. established patient. Telephone visit today for lingering COVID symptoms  HPI Some fatigue, body aches, cough, and headache. Cough seems to be be most troublesome. Overall feeling better than when she was at the worst of it. Taking tyleonol regularly. Otherwise no meds at this time. No nvd, no fever or chills.    Patient Active Problem List   Diagnosis Date Noted  . Blood in stool 05/27/2019  . COVID-19 05/27/2019  . Vitamin D deficiency 12/26/2018  . Class 3 severe obesity with serious comorbidity and body mass index (BMI) of 45.0 to 49.9 in adult ([place]) 12/26/2018  . Right tennis elbow 07/30/2018  . Cubital tunnel syndrome 07/30/2018  . Chronic pain of both feet 01/23/2018  . Bilateral calcaneal spurs 01/23/2018  . Other fatigue  05/23/2017  . Shortness of breath on exertion 05/23/2017  . Prediabetes 05/23/2017  . Liver fibrosis 05/23/2017  . Hyperlipidemia 04/16/2017  . OSA (obstructive sleep apnea) 11/23/2015  . NASH (nonalcoholic steatohepatitis) 08/27/2015  . Dependent edema 08/27/2015  . BMI 40.0-44.9, adult ([place]) 03/15/2015  . Hepatic fibrosis 06/08/2014  . Depression with anxiety 06/06/2013  . Perennial allergic rhinitis 06/06/2013    Past Medical History:  Diagnosis Date  . Allergy   . Anemia   . Anxiety   . Back pain   . Constipation   . Depression   . Fatty liver   . GERD (gastroesophageal reflux disease)   . Hyperlipidemia   . Infertility, female   . Joint pain   . Kidney problem   . 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 Migraines    LIGHT SENSITIVITY , NOW ON IMITREX SINCE 08-02-2018,NOTICES IMPROVEMENT   . Multiple food allergies   . Muscle pain   . Numbness and tingling in both hands   . Pre-diabetes   . Shortness of breath    was due to a medication side effect, now off medication , SOB resolved   . Sleep apnea   . Stress   . Swelling of both lower extremities     Current Outpatient Medications  Medication Sig Dispense Refill  . albuterol (VENTOLIN HFA) 108 (90 Base) MCG/ACT inhaler Inhale 2 puffs into the lungs every 6 (six) hours as needed for wheezing or shortness of breath. 18 g 0  . Alirocumab (PRALUENT) 150  MG/ML SOAJ Inject 1 Dose into the skin every 14 (fourteen) days. 2 pen 11  . busPIRone (BUSPAR) 15 MG tablet Take 1 tablet by mouth twice daily or as directed. 60 tablet 0  . cetirizine (ZYRTEC) 10 MG tablet Take 10 mg by mouth at bedtime.     . fluticasone (FLONASE) 50 MCG/ACT nasal spray Place 2 sprays into both nostrils daily. (Patient taking differently: Place 2 sprays into both nostrils daily as needed for allergies. ) 48 g 3  . levothyroxine (SYNTHROID) 88 MCG tablet Take 1 tablet by mouth daily. (Patient taking differently: Take 88 mcg by  mouth daily before breakfast. ) 30 tablet 0  . LORazepam (ATIVAN) 1 MG tablet Take 1 tablet (1 mg total) by mouth 2 (two) times daily as needed for anxiety. 20 tablet 0  . meclizine (ANTIVERT) 25 MG tablet TAKE ONE TABLET BY MOUTH THREE TIMES A DAY AS NEEDED FOR DIZZINESS (Patient taking differently: Take 25 mg by mouth 3 (three) times daily as needed for dizziness. ) 30 tablet 0  . Multiple Vitamin (MULTIVITAMIN) tablet Take 1 tablet by mouth daily.    . Omega-3 Fatty Acids (FISH OIL PO) Take 1 capsule by mouth 2 (two) times a day.     . ondansetron (ZOFRAN) 4 MG tablet Take 1 tablet (4 mg total) by mouth every 8 (eight) hours as needed for nausea or vomiting. 12 tablet 0  . SUMAtriptan (IMITREX) 50 MG tablet TAKE ONE TABLET BY MOUTH AT ONSET OF HEADACHE; MAY REPEAT ONE TABLET IN 2 HOURS IF NEEDED. (Patient taking differently: Take 50 mg by mouth See admin instructions. Take 1 tablet at onset of headache; may repeat one tablet in 2 hours if needed.) 10 tablet 0  . Ubrogepant (UBRELVY) 50 MG TABS Take 50 mg by mouth as needed (may repeat once after 2 hours). 20 tablet 3  . Vitamin D, Ergocalciferol, (DRISDOL) 1.25 MG (50000 UNIT) CAPS capsule Take 1 capsule (50,000 Units total) by mouth every 7 (seven) days. Tuesday and Friday (Patient taking differently: Take 50,000 Units by mouth 2 (two) times a week. Tuesday and Friday) 10 capsule 0  . buPROPion (WELLBUTRIN SR) 150 MG 12 hr tablet Take 1 tablet (150 mg total) by mouth 2 (two) times daily. 180 tablet 1  . chlorthalidone (HYGROTON) 25 MG tablet Take 1 tablet (25 mg total) by mouth daily. 90 tablet 0  . citalopram (CELEXA) 40 MG tablet Take 1 tablet by mouth daily. 90 tablet 1  . metFORMIN (GLUCOPHAGE) 500 MG tablet Take 1 tablet (500 mg total) by mouth 2 (two) times daily with a meal. 180 tablet 0  . montelukast (SINGULAIR) 10 MG tablet Take 1 tablet by mouth at bedtime. 90 tablet 3  . omeprazole (PRILOSEC) 20 MG capsule Take 1 capsule (20 mg total)  by mouth daily. 90 capsule 0  . topiramate (TOPAMAX) 100 MG tablet Take 1 tablet (100 mg total) by mouth at bedtime. 90 tablet 1   No current facility-administered medications for this visit.    Allergies  Allergen Reactions  . Welchol [Colesevelam] Shortness Of Breath  . Lidocaine Other (See Comments)    Flu like sx's  . Relafen [Nabumetone] Other (See Comments)    migraine  . Celebrex [Celecoxib] Rash  . Sulfa Antibiotics Rash    Social History   Socioeconomic History  . Marital status: Married    Spouse name: [name]  . Number of children: 5  . Years of education: in [place] program  .  Highest education level: Bachelor's degree (e.g., BA, AB, BS)  Occupational History  . Occupation: Product manager: [employer]    Comment: ESL  Tobacco Use  . Smoking status: Never Smoker  . Smokeless tobacco: Never Used  Substance and Sexual Activity  . Alcohol use: No    Alcohol/week: 0.0 standard drinks  . Drug use: No  . Sexual activity: Yes    Partners: Male    Birth control/protection: Post-menopausal  Other Topics Concern  . Not on file  Social History Narrative   Lives with her husband and their 5 adpoted children.    Adopted 2 sets of siblings, all with psychiatric diagnoses (ADHD, Asperger's, ODD, PTSD, depression, bipolar disorder).   Her husband has Asperger's, and works in disability services.   Occasionally drinks coke or pepsi    Social Determinants of Health   Financial Resource Strain:   . Difficulty of Paying Living Expenses:   Food Insecurity:   . Worried About Charity fundraiser in the Last Year:   . Arboriculturist in the Last Year:   Transportation Needs:   . Film/video editor (Medical):   Marland Kitchen Lack of Transportation (Non-Medical):   Physical Activity:   . Days of Exercise per Week:   . Minutes of Exercise per Session:   Stress:   . Feeling of Stress :   Social Connections:   . Frequency of Communication with Friends and Family:     . Frequency of Social Gatherings with Friends and Family:   . Attends Religious Services:   . Active Member of Clubs or Organizations:   . Attends Archivist Meetings:   Marland Kitchen Marital Status:   Intimate Partner Violence:   . Fear of Current or Ex-Partner:   . Emotionally Abused:   Marland Kitchen Physically Abused:   . Sexually Abused:     Review of Systems  Constitutional: Positive for malaise/fatigue. Negative for chills, diaphoresis, fever and weight loss.  Eyes: Negative.   Respiratory: Positive for cough. Negative for hemoptysis, sputum production, shortness of breath and wheezing.   Cardiovascular: Negative.   Gastrointestinal: Negative.   Genitourinary: Negative.   Musculoskeletal: Negative.   Skin: Negative.   Neurological: Negative.   Endo/Heme/Allergies: Negative.   Psychiatric/Behavioral: Negative.   All other systems reviewed and are negative.   Objective   Vitals as reported by the patient: Today's Vitals   06/04/19 1446  BP: (!) 143/89    Cynde was seen today for follow-up.  Diagnoses and all orders for this visit:  Cough -     Discontinue: predniSONE (DELTASONE) 20 MG tablet; Take 3 tabs (48m) for 3 days. Then take 2 tabs (457m for three days. Then take 1 tab (2064mfor three days.   PLAN  Will give strong prednisone taper for ongoing symptoms  Return precautions and ED precautions given  Patient encouraged to call clinic with any questions, comments, or concerns.   I discussed the assessment and treatment plan with the patient. The patient was provided an opportunity to ask questions and all were answered. The patient agreed with the plan and demonstrated an understanding of the instructions.   The patient was advised to call back or seek an in-person evaluation if the symptoms worsen or if the condition fails to improve as anticipated.  I provided 13 minutes of non-face-to-face time during this encounter.  RicMaximiano CossP  Primary Care at  [facility]

## 2019-06-29 IMAGING — US US ABDOMEN LIMITED W/ ELASTOGRAPHY
1 series · 13 of 25 positions shown · non-contrast
Comparison: None.

CLINICAL DATA: NASH



[Series 1: us abdomen limited w/ elastography · 0.30mm/px · 13 of 102 slices shown]
[im 1/102]
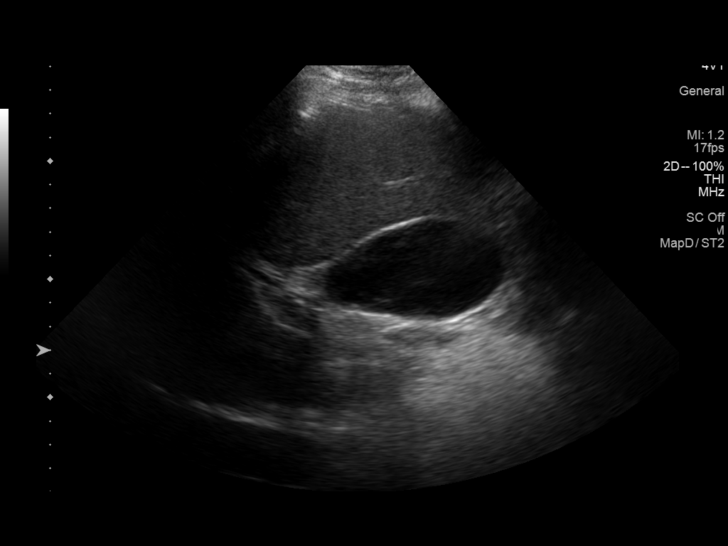
[im 9/102]
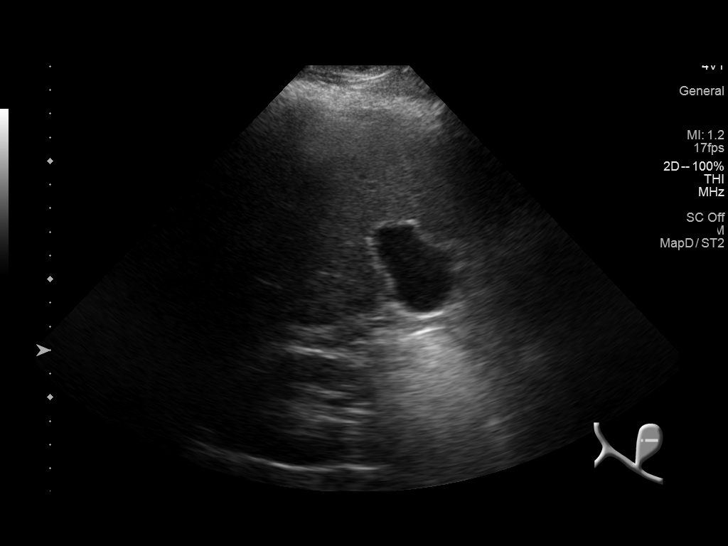
[im 17/102]
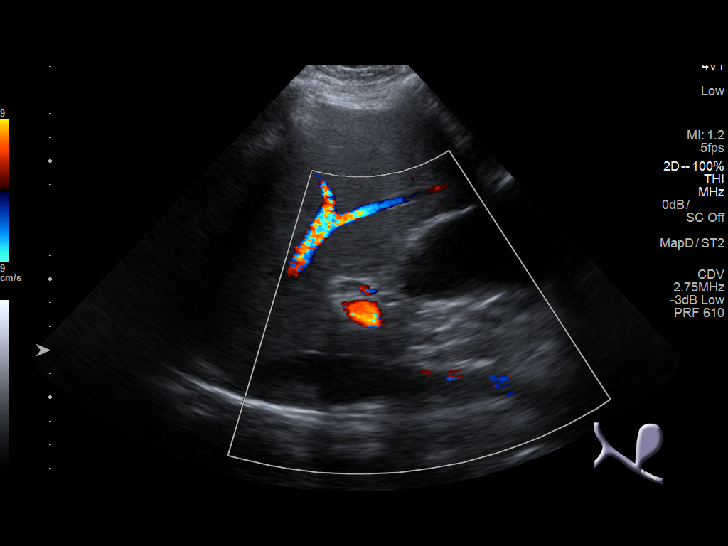
[im 26/102]
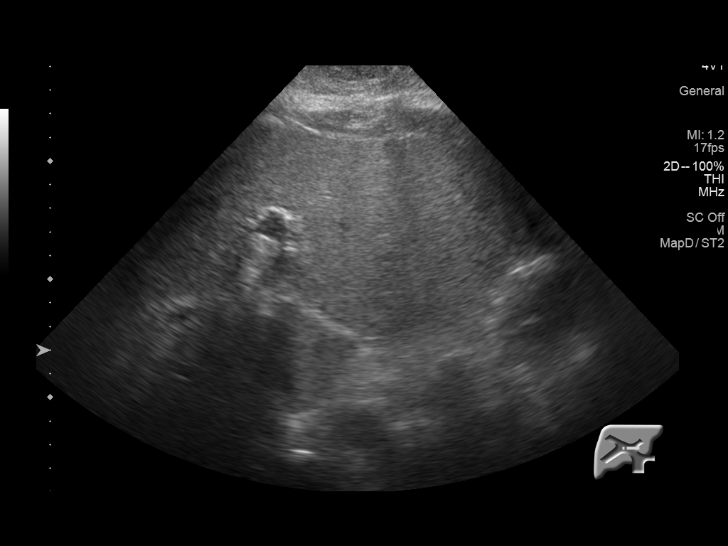
[im 34/102]
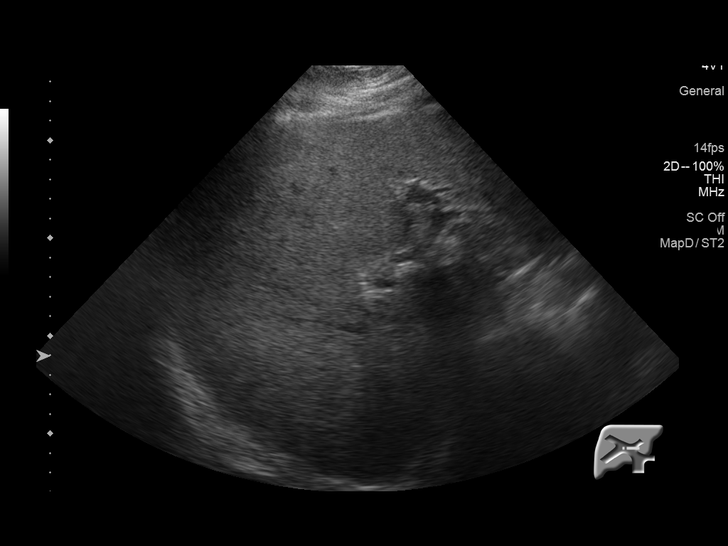
[im 43/102]
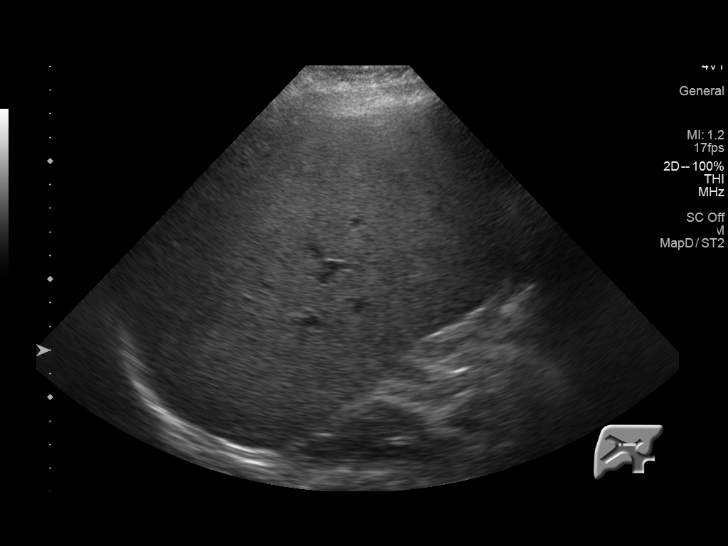
[im 51/102]
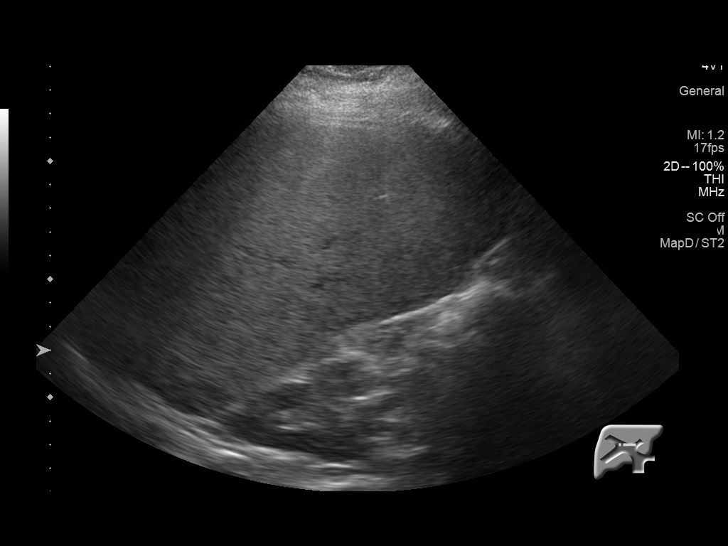
[im 59/102]
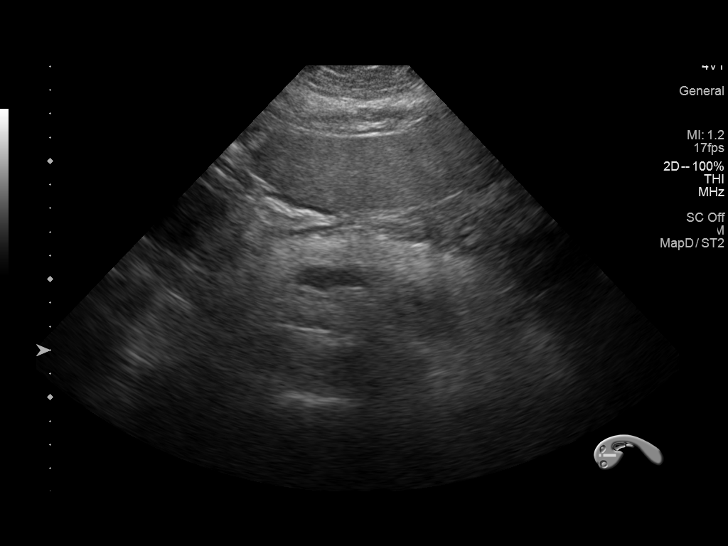
[im 68/102]
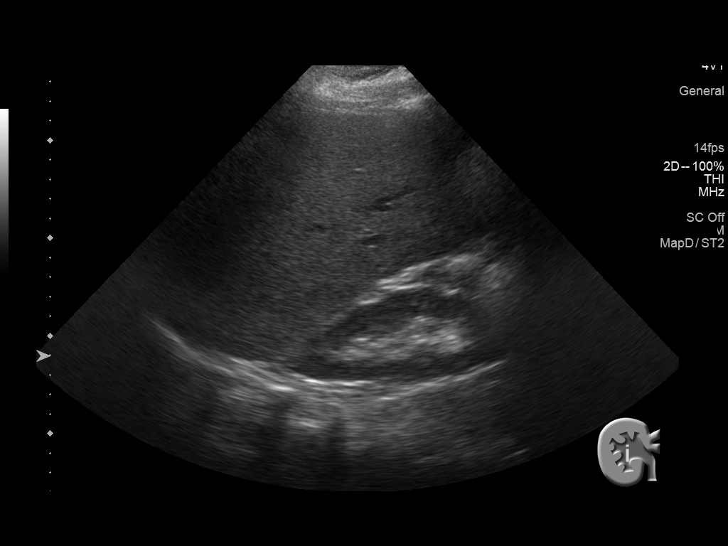
[im 76/102]
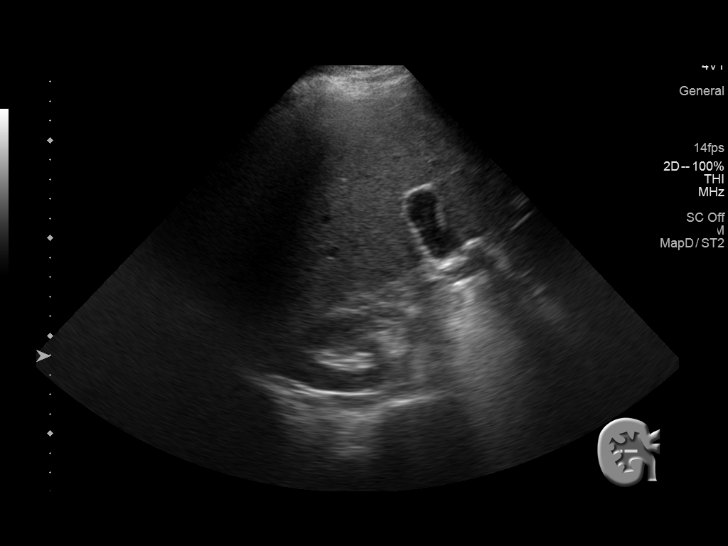
[im 85/102]
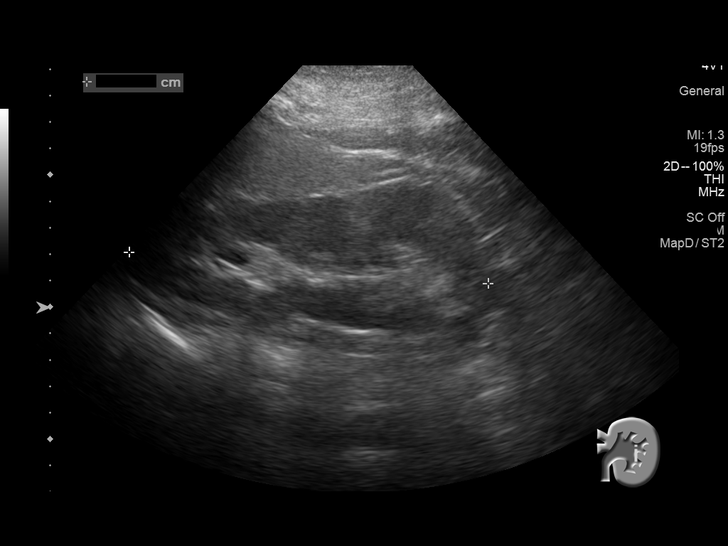
[im 93/102]
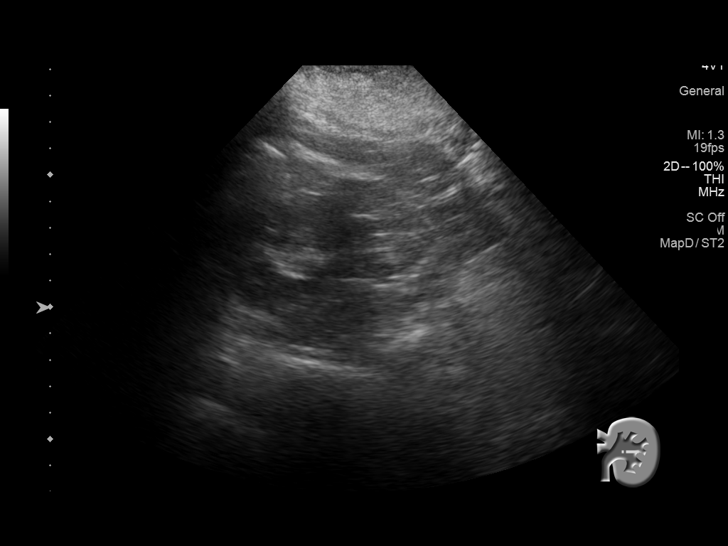
[im 102/102]
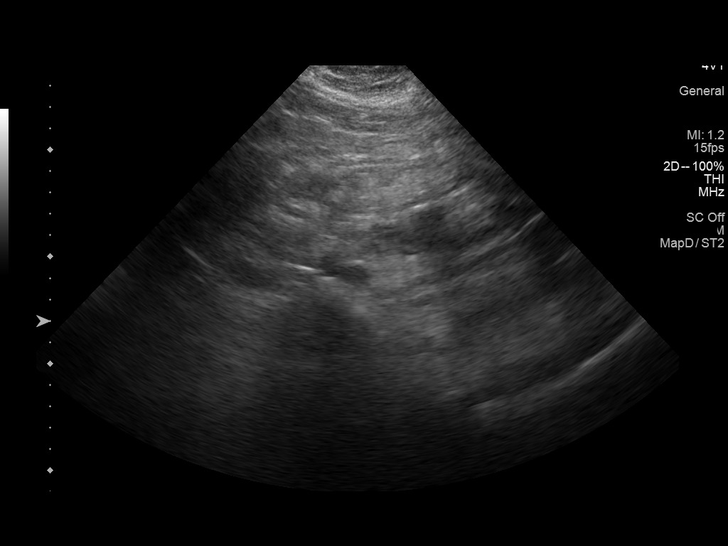

[13 of 25 positions shown; findings below may reference images not displayed]

FINDINGS: ULTRASOUND ABDOMEN

Gallbladder: No gallstones or wall thickening visualized. No
sonographic Murphy sign noted by sonographer.

Common bile duct: Diameter: Upper limits of normal at 6 mm

Liver: Increased hepatic echogenicity. Portal vein is patent on
color Doppler imaging with normal direction of blood flow towards
the liver.

IVC: No abnormality visualized.

Pancreas: Visualized portion unremarkable.

Spleen: Spleen measures 14.7 cm in craniocaudad dimension.
Calculated volume of 743 cubic cm.

Right Kidney: Length: 12.0 cm. Echogenicity within normal limits. No
mass or hydronephrosis visualized.

Left Kidney: Length: 13.6 cm. Echogenicity within normal limits. No
mass or hydronephrosis visualized.

Abdominal aorta: No aneurysm visualized.

Other findings: None.

ULTRASOUND HEPATIC ELASTOGRAPHY

Device: Siemens Helix VTQ

Patient position: Left Lateral Decubitus

Transducer 4V1

Number of measurements: 10

Hepatic segment:  8

Median velocity:   3.31 m/sec

IQR:

IQR/Median velocity ratio:

Corresponding Metavir fibrosis score:  Some F3 + F4

Risk of fibrosis: High

Limitations of exam: None

Please note that abnormal shear wave velocities may also be
identified in clinical settings other than with hepatic fibrosis,
such as: acute hepatitis, elevated right heart and central venous
pressures including use of beta blockers, [place] disease
([place]), infiltrative processes such as
mastocytosis/amyloidosis/infiltrative tumor, extrahepatic
cholestasis, in the post-prandial state, and liver transplantation.
Correlation with patient history, laboratory data, and clinical
condition recommended.
IMPRESSION: ULTRASOUND ABDOMEN:

1. Increased hepatic echogenicity. No duct dilatation or focal
lesion identified.
2. Splenomegaly

ULTRASOUND HEPATIC ELASTOGRAPHY:

Median hepatic shear wave velocity is calculated at 3.3 m/sec.

Corresponding Metavir fibrosis score is Some F3 + F4.

Risk of fibrosis is High.

Follow-up: Follow up advised

## 2019-07-01 ENCOUNTER — Encounter (INDEPENDENT_AMBULATORY_CARE_PROVIDER_SITE_OTHER): Payer: Self-pay | Admitting: Physician Assistant

## 2019-07-01 DIAGNOSIS — E039 Hypothyroidism, unspecified: Secondary | ICD-10-CM

## 2019-07-02 ENCOUNTER — Telehealth: Payer: Self-pay

## 2019-07-02 MED ORDER — LEVOTHYROXINE SODIUM 88 MCG PO TABS: 88.0000 ug | ORAL_TABLET | Freq: Every day | ORAL | 0 refills | Status: DC

## 2019-07-02 NOTE — Telephone Encounter (Signed)
Please advise 

## 2019-07-02 NOTE — Telephone Encounter (Signed)
A PA for ubrelvy 35m has been sent through covermymeds, Key [id number] Dx R51.9 Pt Takes Topamax daily at bedtime

## 2019-07-02 NOTE — Telephone Encounter (Signed)
Ok to send    thanks

## 2019-07-03 ENCOUNTER — Telehealth: Payer: Self-pay | Admitting: Internal Medicine

## 2019-07-03 NOTE — Telephone Encounter (Signed)
PA for ubrelvy 28m has been approved.  Coverage  period for 07/02/2019-07/01/2020 Noticed faxed to pharmacy

## 2019-07-03 NOTE — Telephone Encounter (Signed)
livalo prior auth generated via [place] Submitted 07/02/19 (Key: [id number])

## 2019-07-04 NOTE — Telephone Encounter (Addendum)
Previous attempts have been made to appeal Livalo denial (see chart)  [place] and did another PA request over the phone @ [phone] - contraindication to statins d/t elevated liver enzymes  Medication approved from 07/04/19 - 07/03/20 Approval # [id number]  Patient notified via [place]

## 2019-07-04 NOTE — Telephone Encounter (Signed)
Livalo PA denied  [facility] has approved criteria in place for coverage of this medication. We needed additional clinical information from your provider in order to make a decision to either approve or deny the request. We did not receive the additional clinical information within the time allowed to make the decision; therefore, the request is denied.   The request is denied because: Your plan approved HMG-CoA Reductase Inhibitors (FA-PA) criteria covers this drug when you have a contraindication to all the alternatives, or if complete and valid documentation is provided of you trying and having an inadequate treatment response or intolerance to the required number of formulary alternatives. Your use of this drug does not meet the requirement. This is based on the information that was provided. Formulary alternatives are: atorvastatin, ezetimibe-simvastatin, fluvastatin, lovastatin, pravastatin, rosuvastatin, simvastatin. Requirement: Trial and failure of 3 or more in a class with at least 3 alternatives, 2 in a class with 2 alternatives, or 1 in a class with only 1 alternative.  If you would still like to request coverage for this prescription drug, you or your provider may submit another prior authorization request, along with the necessary clinical information, to [facility] by mail, phone or fax to:  [facility] Prior Authorization (Commercial) [address] [place] [address] Phone: [phone] Fax: [phone]

## 2019-07-16 ENCOUNTER — Other Ambulatory Visit (INDEPENDENT_AMBULATORY_CARE_PROVIDER_SITE_OTHER): Payer: Self-pay | Admitting: Family Medicine

## 2019-07-16 DIAGNOSIS — E559 Vitamin D deficiency, unspecified: Secondary | ICD-10-CM

## 2019-07-17 ENCOUNTER — Other Ambulatory Visit: Payer: Self-pay

## 2019-07-17 ENCOUNTER — Encounter (INDEPENDENT_AMBULATORY_CARE_PROVIDER_SITE_OTHER): Payer: Self-pay | Admitting: Physician Assistant

## 2019-07-17 ENCOUNTER — Ambulatory Visit (INDEPENDENT_AMBULATORY_CARE_PROVIDER_SITE_OTHER): Payer: BC Managed Care – PPO | Admitting: Physician Assistant

## 2019-07-17 VITALS — BP 120/76 | HR 68 | Temp 98.6°F | Ht 68.0 in | Wt 318.0 lb

## 2019-07-17 DIAGNOSIS — E559 Vitamin D deficiency, unspecified: Secondary | ICD-10-CM | POA: Diagnosis not present

## 2019-07-17 DIAGNOSIS — F3289 Other specified depressive episodes: Secondary | ICD-10-CM | POA: Diagnosis not present

## 2019-07-17 DIAGNOSIS — Z6841 Body Mass Index (BMI) 40.0 and over, adult: Secondary | ICD-10-CM | POA: Diagnosis not present
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
## 2019-07-21 NOTE — Progress Notes (Signed)
Chief Complaint:   Melanie [name] is here to discuss her progress with her Melanie treatment plan along with follow-up of her Melanie related diagnoses. Melanie [name] is on the Category 2 Plan and states she is following her eating plan approximately 50% of the time. Melanie [name] states she is walking when able to.   Today's visit was #: 70 Starting weight: 338 lbs Starting date: 05/23/2017 Today's weight: 318 lbs Today's date: 07/17/2019 Total lbs lost to date: 20 Total lbs lost since last in-office visit: 1  Interim History: Melanie [name] reports a stressful last few weeks with her family situation. She states that her eating is "sporadic" and that some days are better than others.  Subjective:   Vitamin D deficiency. Melanie [name] is on Vitamin D. No nausea, vomiting, or muscle weakness. Last Vitamin D level was not at goal - 43.7 on 04/16/2019.  Other depression, with emotional eating. Melanie [name] is struggling with emotional eating and using food for comfort to the extent that it is negatively impacting her health. She has been working on behavior modification techniques to help reduce her emotional eating and has been somewhat successful. She shows no sign of suicidal or homicidal ideations. Melanie [name] is on bupropion and Celexa. She reports stress eating 5 times a week due to stressful home situation. She denies cravings.  Assessment/Plan:   Vitamin D deficiency. Low Vitamin D level contributes to fatigue and are associated with Melanie, breast, and colon cancer. She agrees to continue to take Vitamin D and will follow-up for routine testing of Vitamin D, at least 2-3 times per year to avoid over-replacement.  Other depression, with emotional eating. Behavior modification techniques were discussed today to help Melanie [name] deal with her emotional/non-hunger eating behaviors.  Orders and follow up as documented in patient record. Melanie [name] will continue her medications as directed. Will refer to Dr. Mallie Mussel, our bariatric  psychologist, for evaluation.  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  Melanie [name] is currently in the action stage of change. As such, her goal is to continue with weight loss efforts. She has agreed to the Category 2 Plan.   Exercise goals: For substantial health benefits, adults should do at least 150 minutes (2 hours and 30 minutes) a week of moderate-intensity, or 75 minutes (1 hour and 15 minutes) a week of vigorous-intensity aerobic physical activity, or an equivalent combination of moderate- and vigorous-intensity aerobic activity. Aerobic activity should be performed in episodes of at least 10 minutes, and preferably, it should be spread throughout the week.  Behavioral modification strategies: increasing lean protein intake and no skipping meals.  Melanie [name] has agreed to follow-up with our clinic in 3 weeks. She was informed of the importance of frequent follow-up visits to maximize her success with intensive lifestyle modifications for her multiple health conditions.   Objective:   Blood pressure 120/76, pulse 68, temperature 98.6 F (37 C), temperature source Oral, height 5' 8"  (1.727 m), weight (!) 318 lb (144.2 kg), SpO2 97 %. Body mass index is 48.35 kg/m.  General: Cooperative, alert, well developed, in no acute distress. HEENT: Conjunctivae and lids unremarkable. Cardiovascular: Regular rhythm.  Lungs: Normal work of breathing. Neurologic: No focal deficits.   Lab Results  Component Value Date   CREATININE 1.06 (H) 05/27/2019   BUN 19 05/27/2019   NA 138 05/27/2019   K 3.0 (L) 05/27/2019   CL 99 05/27/2019   CO2  28 05/27/2019   Lab Results  Component Value Date   ALT 102 (H) 05/27/2019   AST 66 (H) 05/27/2019   ALKPHOS 156 (H) 05/27/2019   BILITOT 1.4 (H) 05/27/2019   Lab Results  Component Value Date   HGBA1C 5.6 04/16/2019   HGBA1C 5.6 12/24/2018   HGBA1C 5.5 07/25/2018    HGBA1C 5.5 05/01/2018   HGBA1C 5.6 12/13/2017   Lab Results  Component Value Date   INSULIN 72.8 (H) 04/16/2019   INSULIN 46.3 (H) 12/24/2018   INSULIN 39.2 (H) 07/25/2018   INSULIN 30.4 (H) 05/01/2018   INSULIN 39.0 (H) 12/13/2017   Lab Results  Component Value Date   TSH 3.950 12/24/2018   Lab Results  Component Value Date   CHOL 220 (H) 04/16/2019   HDL 63 04/16/2019   LDLCALC 122 (H) 04/16/2019   TRIG 200 (H) 04/16/2019   CHOLHDL 7.0 (H) 07/26/2018   Lab Results  Component Value Date   WBC 4.7 05/27/2019   HGB 14.5 05/27/2019   HCT 42.9 05/27/2019   MCV 91.5 05/27/2019   PLT 211 05/27/2019   Lab Results  Component Value Date   IRON 95 06/27/2013   TIBC 371 06/27/2013   FERRITIN 401 (H) 06/27/2013   Attestation Statements:   Reviewed by clinician on day of visit: allergies, medications, problem list, medical history, surgical history, family history, social history, and previous encounter notes.  Time spent on visit including pre-visit chart review and post-visit charting and care was 30 minutes.   IMichaelene Song, am acting as transcriptionist for Abby Potash, PA-C   I have reviewed the above documentation for accuracy and completeness, and I agree with the above. Abby Potash, PA-C

## 2019-07-22 ENCOUNTER — Other Ambulatory Visit (INDEPENDENT_AMBULATORY_CARE_PROVIDER_SITE_OTHER): Payer: Self-pay | Admitting: Physician Assistant

## 2019-07-22 DIAGNOSIS — K219 Gastro-esophageal reflux disease without esophagitis: Secondary | ICD-10-CM

## 2019-07-24 ENCOUNTER — Other Ambulatory Visit: Payer: Self-pay

## 2019-07-24 ENCOUNTER — Telehealth (INDEPENDENT_AMBULATORY_CARE_PROVIDER_SITE_OTHER): Payer: BC Managed Care – PPO | Admitting: Psychology

## 2019-07-24 DIAGNOSIS — F330 Major depressive disorder, recurrent, mild: Secondary | ICD-10-CM

## 2019-07-24 NOTE — Progress Notes (Signed)
  Office: [phone]  /  Fax: [phone]    Date: Aug 07, 2019   Appointment Start Time: 3:42pm Duration: 31 minutes Provider: Glennie Isle, Psy.D. Type of Session: Individual Therapy  Location of Patient: Home Location of Provider: Provider's Home Type of Contact: Telepsychological Visit via MyChart Video Visit  Session Content: This provider called Melanie [name] at 3:39pm to offer to meet earlier. Melanie [name] was receptive and noted she would join the appointment. Melanie [name] is a 44 y.o. female presenting via [place] Visit for a follow-up appointment to address the previously established treatment goal of increasing coping skills. Today's appointment was a telepsychological visit due to COVID-19. Melanie [name] provided verbal consent for today's telepsychological appointment and she is aware she is responsible for securing confidentiality on her end of the session. Prior to proceeding with today's appointment, Melanie [name]'s physical location at the time of this appointment was obtained as well a phone number she could be reached at in the event of technical difficulties. Melanie [name] and this provider participated in today's telepsychological service.   This provider conducted a brief check-in. Melanie [name] reported challenges with eating, noting "I don't want to do anything." She explained she is "always" stressed and tired. Thus, psychoeducation regarding the importance of self-care utilizing the oxygen mask metaphor was provided. Additionally, psychoeducation regarding pleasurable activities, including its impact on emotional eating and overall well-being was provided. Melanie [name] was provided with a handout with various options of pleasurable activities, and was encouraged to engage in one activity a day and additional activities as needed when triggered to emotionally eat. Melanie [name] agreed. Melanie [name] provided verbal consent during today's appointment for this provider to send a handout with pleasurable activities via e-mail. Melanie [name] was receptive to  today's appointment as evidenced by openness to sharing, responsiveness to feedback, and willingness to engage in pleasurable activities to assist with coping.  Mental Status Examination:  Appearance: well groomed and appropriate hygiene  Behavior: appropriate to circumstances Mood: euthymic Affect: mood congruent Speech: normal in rate, volume, and tone Eye Contact: appropriate Psychomotor Activity: appropriate Gait: unable to assess Thought Process: linear, logical, and goal directed  Thought Content/Perception: no hallucinations, delusions, bizarre thinking or behavior reported or observed and no evidence of suicidal and homicidal ideation, plan, and intent Orientation: time, person, place and purpose of appointment Memory/Concentration: memory, attention, language, and fund of knowledge intact  Insight/Judgment: good  Interventions:  Conducted a brief chart review Provided empathic reflections and validation Employed supportive psychotherapy interventions to facilitate reduced distress and to improve coping skills with identified stressors Employed motivational interviewing skills to assess patient's willingness/desire to adhere to recommended medical treatments and assignments Psychoeducation provided regarding pleasurable activities Psychoeducation provided regarding self-care  DSM-5 Diagnosis(es): 296.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 Progress is limited, as Melanie [name] has just begun treatment with this provider; however, she is receptive to the interaction and interventions and rapport is being established.   Plan: The next appointment will be scheduled in three weeks, which will be via MyChart Video Visit. The next session will focus on working towards the established treatment goal. She  noted her next appointment with her other therapist is on August 12, 2019.

## 2019-07-29 ENCOUNTER — Other Ambulatory Visit (INDEPENDENT_AMBULATORY_CARE_PROVIDER_SITE_OTHER): Payer: Self-pay | Admitting: Physician Assistant

## 2019-07-29 DIAGNOSIS — F419 Anxiety disorder, unspecified: Secondary | ICD-10-CM

## 2019-08-02 ENCOUNTER — Other Ambulatory Visit (INDEPENDENT_AMBULATORY_CARE_PROVIDER_SITE_OTHER): Payer: Self-pay | Admitting: Physician Assistant

## 2019-08-02 DIAGNOSIS — F419 Anxiety disorder, unspecified: Secondary | ICD-10-CM

## 2019-08-05 ENCOUNTER — Other Ambulatory Visit (INDEPENDENT_AMBULATORY_CARE_PROVIDER_SITE_OTHER): Payer: Self-pay | Admitting: Family Medicine

## 2019-08-05 DIAGNOSIS — E559 Vitamin D deficiency, unspecified: Secondary | ICD-10-CM

## 2019-08-07 ENCOUNTER — Telehealth (INDEPENDENT_AMBULATORY_CARE_PROVIDER_SITE_OTHER): Payer: BC Managed Care – PPO | Admitting: Psychology

## 2019-08-07 ENCOUNTER — Other Ambulatory Visit: Payer: Self-pay

## 2019-08-07 ENCOUNTER — Ambulatory Visit (INDEPENDENT_AMBULATORY_CARE_PROVIDER_SITE_OTHER): Payer: BC Managed Care – PPO | Admitting: Physician Assistant

## 2019-08-07 DIAGNOSIS — F330 Major depressive disorder, recurrent, mild: Secondary | ICD-10-CM | POA: Diagnosis not present

## 2019-08-12 ENCOUNTER — Other Ambulatory Visit (INDEPENDENT_AMBULATORY_CARE_PROVIDER_SITE_OTHER): Payer: Self-pay | Admitting: Family Medicine

## 2019-08-12 DIAGNOSIS — E559 Vitamin D deficiency, unspecified: Secondary | ICD-10-CM

## 2019-08-14 NOTE — Progress Notes (Unsigned)
Office: [phone]  /  Fax: [phone]    Date: August 28, 2019   Appointment Start Time: *** Duration: *** minutes Provider: Glennie Isle, Psy.D. Type of Session: Individual Therapy  Location of Patient: {gbptloc:23249} Location of Provider: Provider's Home Type of Contact: Telepsychological Visit via MyChart Video Visit  Session Content:This provider called Melanie [name] at 8:02am as she did not present for the telepsychological appointment. A HIPAA compliant voicemail was left requesting a call back. As such, today's appointment was initiated *** minutes late.  Melanie [name] is a 44 y.o. female presenting via [place] Visit for a follow-up appointment to address the previously established treatment goal of increasing coping skills. Today's appointment was a telepsychological visit due to COVID-19. Melanie [name] provided verbal consent for today's telepsychological appointment and she is aware she is responsible for securing confidentiality on her end of the session. Prior to proceeding with today's appointment, Melanie [name]'s physical location at the time of this appointment was obtained as well a phone number she could be reached at in the event of technical difficulties. Melanie [name] and this provider participated in today's telepsychological service.   This provider conducted a brief check-in and verbally administered the PHQ-9 and GAD-7. ***Psychoeducation regarding triggers for emotional eating was provided. Melanie [name] was provided a handout, and encouraged to utilize the handout between now and the next appointment to increase awareness of triggers and frequency. Melanie [name] agreed. This provider also discussed behavioral strategies for specific triggers, such as placing the utensil down when conversing to avoid mindless eating. Clinton provided verbal consent during today's appointment for this provider to send a handout about triggers via e-mail.  Melanie [name] was receptive to today's appointment as evidenced by openness to sharing,  responsiveness to feedback, and {gbreceptiveness:23401}.  Mental Status Examination:  Appearance: well groomed and appropriate hygiene  Behavior: appropriate to circumstances Mood: euthymic Affect: mood congruent Speech: normal in rate, volume, and tone Eye Contact: appropriate Psychomotor Activity: appropriate Gait: unable to assess Thought Process: linear, logical, and goal directed  Thought Content/Perception: no hallucinations, delusions, bizarre thinking or behavior reported or observed and no evidence of suicidal and homicidal ideation, plan, and intent Orientation: time, person, place, and purpose of appointment Memory/Concentration: memory, attention, language, and fund of knowledge intact  Insight/Judgment: good  Structured Assessments Results: The Patient Health Questionnaire-9 (PHQ-9) is a self-report measure that assesses symptoms and severity of depression over the course of the last two weeks. Melanie [name] obtained a score of *** suggesting {GBPHQ9SEVERITY:21752}. Melanie [name] finds the endorsed symptoms to be {gbphq9difficulty:21754}. [0= Not at all; 1= Several days; 2= More than half the days; 3= Nearly every day] Little interest or pleasure in doing things ***  Feeling down, depressed, or hopeless ***  Trouble falling or staying asleep, or sleeping too much ***  Feeling tired or having little energy ***  Poor appetite or overeating ***  Feeling bad about yourself --- or that you are a failure or have let yourself or your family down ***  Trouble concentrating on things, such as reading the newspaper or watching television ***  Moving or speaking so slowly that other people could have noticed? Or the opposite --- being so fidgety or restless that you have been moving around a lot more than usual ***  Thoughts that you would be better off dead or hurting yourself in some way ***  PHQ-9 Score ***    The Generalized Anxiety Disorder-7 (GAD-7) is a brief self-report measure that assesses  symptoms of anxiety over the course of the last two  weeks. Melanie [name] obtained a score of *** suggesting {gbgad7severity:21753}. Melanie [name] finds the endorsed symptoms to be {gbphq9difficulty:21754}. [0= Not at all; 1= Several days; 2= Over half the days; 3= Nearly every day] Feeling nervous, anxious, on edge ***  Not being able to stop or control worrying ***  Worrying too much about different things ***  Trouble relaxing ***  Being so restless that it's hard to sit still ***  Becoming easily annoyed or irritable ***  Feeling afraid as if something awful might happen ***  GAD-7 Score ***   Interventions:  {Interventions for Progress Notes:23405}  DSM-5 Diagnosis(es): 296.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 Melanie [name] has demonstrated progress in her goal as evidenced by {gbtxprogress:22839}. Melanie [name] also {gbtxprogress2:22951}.  Plan: The next appointment will be scheduled in {gbweeks:21758}, which will be {gbtxmodality:23402}. The next session will focus on {Plan for Next Appointment:23400}.

## 2019-08-15 ENCOUNTER — Telehealth: Payer: Self-pay | Admitting: Registered Nurse

## 2019-08-15 ENCOUNTER — Other Ambulatory Visit (INDEPENDENT_AMBULATORY_CARE_PROVIDER_SITE_OTHER): Payer: Self-pay | Admitting: Physician Assistant

## 2019-08-15 ENCOUNTER — Encounter: Payer: Self-pay | Admitting: Registered Nurse

## 2019-08-15 DIAGNOSIS — E559 Vitamin D deficiency, unspecified: Secondary | ICD-10-CM

## 2019-08-15 DIAGNOSIS — Z130 Encounter for screening for diseases of the blood and blood-forming organs and certain disorders involving the immune mechanism: Secondary | ICD-10-CM

## 2019-08-15 DIAGNOSIS — Z13220 Encounter for screening for lipoid disorders: Secondary | ICD-10-CM

## 2019-08-15 NOTE — Telephone Encounter (Signed)
Pt would like you to start filling her Buspirone 15 mg BID, Dr. Randa Spike has previously filled but would like [place] to take over.  Is this appropriate?

## 2019-08-15 NOTE — Telephone Encounter (Signed)
Please order  Fasting labs for cpe  scheduled for 07/14/201

## 2019-08-15 NOTE — Telephone Encounter (Signed)
Pt has CPE in July. Are the labs pended okay for her to receive as Nurse LAB Draw visit?

## 2019-08-18 ENCOUNTER — Other Ambulatory Visit: Payer: Self-pay | Admitting: Registered Nurse

## 2019-08-18 DIAGNOSIS — F3289 Other specified depressive episodes: Secondary | ICD-10-CM

## 2019-08-18 MED ORDER — BUSPIRONE HCL 15 MG PO TABS: 15.0000 mg | ORAL_TABLET | Freq: Two times a day (BID) | ORAL | 3 refills | Status: DC

## 2019-08-18 NOTE — Progress Notes (Signed)
Refill per pt request  Kathrin Ruddy, NP

## 2019-08-19 NOTE — Telephone Encounter (Signed)
Added cbc w diff and tsh, signed. She can come in any time within 2-3 weeks of her cpe. Thanks!  Kathrin Ruddy, NP

## 2019-08-20 ENCOUNTER — Ambulatory Visit: Payer: BC Managed Care – PPO | Admitting: Adult Health

## 2019-08-20 ENCOUNTER — Other Ambulatory Visit: Payer: Self-pay

## 2019-08-20 VITALS — BP 125/69 | HR 70 | Ht 68.0 in | Wt 330.0 lb

## 2019-08-20 DIAGNOSIS — G4733 Obstructive sleep apnea (adult) (pediatric): Secondary | ICD-10-CM

## 2019-08-20 NOTE — Progress Notes (Addendum)
PATIENT: Melanie [name] DOB: [birth year]  REASON FOR VISIT: follow up HISTORY FROM: patient  HISTORY OF PRESENT ILLNESS: Today 08/20/19:  Melanie [name] is a 44 year old female with a history of obstructive sleep apnea on BiPAP.  Her download indicates that she used her machine nightly for compliance of 100%.  She use her machine greater than 4 hours 29 days for compliance of 97%.  On average she uses her machine 7 hours and 30 minutes.  Her residual AHI is 4.6 on 18/14 centimeters of water.  Leak in the 95th percentile is 2.4 L/min.  Melanie [name] reports that she just got a call from her husband saying that her 72 year old daughter has been away.  Reports that the CPAP is working well but she does have some questions.  Reports that she will send them through my chart as she needs to get home soon as possible  HISTORY 02/13/2019: I reviewed her BiPAP compliance data from 01/13/2019 through 02/11/2019 which is a total of 30 days, during which time she used her BiPAP every night with percent use days greater than 4 hours at 97%, indicating excellent compliance with an average usage of 7 hours and 22 minutes, residual AHI borderline at 6.9/h, leak on the low side with a 95th percentile at 4.6 L/min on a pressure of 18/14.  Her residual events are mostly obstructive in nature, central apnea index was 2.2/h.  She reports feeling better in terms of her daytime somnolence and sleep quality.  Headache severity is also better.  Nevertheless, she still has some intermittent head pressure, sometimes feels she has blurry vision but had a full eye examination about 3 to 4 months ago and got new prescription eyeglasses.  She has no visual field disturbance, sometimes she feels lightheaded especially if she stands up after sitting for prolonged period of time.  She has had some spinning sensation such as vertigo, no hearing loss or tinnitus.  She tries to hydrate well.  She drinks caffeine in the form of Coke zero, 1 or 2  cans/day on average.  She goes to weight management.  REVIEW OF SYSTEMS: Out of a complete 14 system review of symptoms, the patient complains only of the following symptoms, and all other reviewed systems are negative.  ESS 8  ALLERGIES: Allergies  Allergen Reactions  . Welchol [Colesevelam] Shortness Of Breath  . Lidocaine Other (See Comments)    Flu like sx's  . Relafen [Nabumetone] Other (See Comments)    migraine  . Celebrex [Celecoxib] Rash  . Sulfa Antibiotics Rash    HOME MEDICATIONS: Outpatient Medications Prior to Visit  Medication Sig Dispense Refill  . albuterol (VENTOLIN HFA) 108 (90 Base) MCG/ACT inhaler Inhale 2 puffs into the lungs every 6 (six) hours as needed for wheezing or shortness of breath. 18 g 0  . Alirocumab (PRALUENT) 150 MG/ML SOAJ Inject 1 Dose into the skin every 14 (fourteen) days. 2 pen 11  . buPROPion (WELLBUTRIN SR) 150 MG 12 hr tablet Take 1 tablet (150 mg total) by mouth 2 (two) times daily. 180 tablet 1  . busPIRone (BUSPAR) 15 MG tablet Take 1 tablet (15 mg total) by mouth 2 (two) times daily. 180 tablet 3  . cetirizine (ZYRTEC) 10 MG tablet Take 10 mg by mouth at bedtime.     . chlorthalidone (HYGROTON) 25 MG tablet Take 1 tablet (25 mg total) by mouth daily. 90 tablet 0  . citalopram (CELEXA) 40 MG tablet Take 1 tablet by mouth  daily. 90 tablet 1  . fluticasone (FLONASE) 50 MCG/ACT nasal spray Place 2 sprays into both nostrils daily. (Patient taking differently: Place 2 sprays into both nostrils daily as needed for allergies. ) 48 g 3  . levothyroxine (SYNTHROID) 88 MCG tablet Take 1 tablet (88 mcg total) by mouth daily before breakfast. 30 tablet 0  . LORazepam (ATIVAN) 1 MG tablet Take 1 tablet (1 mg total) by mouth 2 (two) times daily as needed for anxiety. 20 tablet 0  . meclizine (ANTIVERT) 25 MG tablet TAKE ONE TABLET BY MOUTH THREE TIMES A DAY AS NEEDED FOR DIZZINESS (Patient taking differently: Take 25 mg by mouth 3 (three) times daily as  needed for dizziness. ) 30 tablet 0  . metFORMIN (GLUCOPHAGE) 500 MG tablet Take 1 tablet (500 mg total) by mouth 2 (two) times daily with a meal. 180 tablet 0  . montelukast (SINGULAIR) 10 MG tablet Take 1 tablet by mouth at bedtime. 90 tablet 3  . Multiple Vitamin (MULTIVITAMIN) tablet Take 1 tablet by mouth daily.    . Omega-3 Fatty Acids (FISH OIL PO) Take 1 capsule by mouth 2 (two) times a day.     Marland Kitchen omeprazole (PRILOSEC) 20 MG capsule Take 1 capsule (20 mg total) by mouth daily. 90 capsule 0  . ondansetron (ZOFRAN) 4 MG tablet Take 1 tablet (4 mg total) by mouth every 8 (eight) hours as needed for nausea or vomiting. 12 tablet 0  . SUMAtriptan (IMITREX) 50 MG tablet TAKE ONE TABLET BY MOUTH AT ONSET OF HEADACHE; MAY REPEAT ONE TABLET IN 2 HOURS IF NEEDED. (Patient taking differently: Take 50 mg by mouth See admin instructions. Take 1 tablet at onset of headache; may repeat one tablet in 2 hours if needed.) 10 tablet 0  . topiramate (TOPAMAX) 100 MG tablet Take 1 tablet (100 mg total) by mouth at bedtime. 90 tablet 1  . Ubrogepant (UBRELVY) 50 MG TABS Take 50 mg by mouth as needed (may repeat once after 2 hours). 20 tablet 3  . Vitamin D, Ergocalciferol, (DRISDOL) 1.25 MG (50000 UNIT) CAPS capsule Take 1 capsule (50,000 Units total) by mouth every 7 (seven) days. Tuesday and Friday (Patient taking differently: Take 50,000 Units by mouth 2 (two) times a week. Tuesday and Friday) 10 capsule 0   No facility-administered medications prior to visit.    PAST MEDICAL HISTORY: Past Medical History:  Diagnosis Date  . Allergy   . Anemia   . Anxiety   . Back pain   . Constipation   . Depression   . Fatty liver   . GERD (gastroesophageal reflux disease)   . Hyperlipidemia   . Infertility, female   . Joint pain   . Kidney problem   . 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 Migraines    LIGHT SENSITIVITY , NOW ON IMITREX SINCE 08-02-2018,NOTICES IMPROVEMENT   . Multiple  food allergies   . Muscle pain   . Numbness and tingling in both hands   . Pre-diabetes   . Shortness of breath    was due to a medication side effect, now off medication , SOB resolved   . Sleep apnea   . Stress   . Swelling of both lower extremities     PAST SURGICAL HISTORY: Past Surgical History:  Procedure Laterality Date  . ELBOW LIGAMENT RECONSTRUCTION Right 08/21/2018   Procedure: LATERAL COLLATERAL LIGAMENT REPAIR;  Surgeon: Bjorn Pippin, MD;  Location: [facility];  Service: Orthopedics;  Laterality: Right;  . FINGER SURGERY Left     index finger  . KNEE SURGERY Left   . LIVER BIOPSY N/A 05/15/2014   Procedure: LIVER BIOPSY;  Surgeon: Louis Meckel, MD;  Location: [facility];  Service: Endoscopy;  Laterality: N/A;  ultrasound to mark the liver  . TENNIS ELBOW RELEASE/NIRSCHEL PROCEDURE Right 08/21/2018   Procedure: TENNIS ELBOW RELEASE/NIRSCHEL PROCEDURE;  Surgeon: Bjorn Pippin, MD;  Location: [facility];  Service: Orthopedics;  Laterality: Right;  . TONSILLECTOMY AND ADENOIDECTOMY  1978  . TYMPANOSTOMY TUBE PLACEMENT    . ULNAR NERVE TRANSPOSITION Right 08/21/2018   Procedure: ULNAR NERVE DECOMPRESSION/TRANSPOSITION;  Surgeon: Bjorn Pippin, MD;  Location: [facility];  Service: Orthopedics;  Laterality: Right;  . wrist cyst Left     FAMILY HISTORY: Family History  Problem Relation Age of Onset  . Diabetes Mother   . Liver disease Mother        ESLD- unknown etiology  . Obesity Mother   . Diabetes Maternal Grandmother   . Diabetes Paternal Grandmother   . Breast cancer Paternal Grandmother   . Stroke Maternal Grandfather   . Hyperlipidemia Father     SOCIAL HISTORY: Social History   Socioeconomic History  . Marital status: Married    Spouse name: [name]  . Number of children: 5  . Years of education: in [employer] program  . Highest education level: Bachelor's degree (e.g., BA, AB, BS)  Occupational History  . Occupation: Magazine features editor: [employer]     Comment: E[employer]  Tobacco Use  . Smoking status: Never Smoker  . Smokeless tobacco: Never Used  Substance and Sexual Activity  . Alcohol use: No    Alcohol/week: 0.0 standard drinks  . Drug use: No  . Sexual activity: Yes    Partners: Male    Birth control/protection: Post-menopausal  Other Topics Concern  . Not on file  Social History Narrative   Lives with her husband and their 5 adpoted children.    Adopted 2 sets of siblings, all with psychiatric diagnoses (ADHD, Asperger's, ODD, PTSD, depression, bipolar disorder).   Her husband has Asperger's, and works in disability services.   Occasionally drinks coke or pepsi    Social Determinants of Health   Financial Resource Strain:   . Difficulty of Paying Living Expenses:   Food Insecurity:   . Worried About Programme researcher, broadcasting/film/video in the Last Year:   . Barista in the Last Year:   Transportation Needs:   . Freight forwarder (Medical):   Marland Kitchen Lack of Transportation (Non-Medical):   Physical Activity:   . Days of Exercise per Week:   . Minutes of Exercise per Session:   Stress:   . Feeling of Stress :   Social Connections:   . Frequency of Communication with Friends and Family:   . Frequency of Social Gatherings with Friends and Family:   . Attends Religious Services:   . Active Member of Clubs or Organizations:   . Attends Banker Meetings:   Marland Kitchen Marital Status:   Intimate Partner Violence:   . Fear of Current or Ex-Partner:   . Emotionally Abused:   Marland Kitchen Physically Abused:   . Sexually Abused:       PHYSICAL EXAM  Vitals:   08/20/19 1457  BP: 125/69  Pulse: 70  Weight: (!) 330 lb (149.7 kg)  Height: 5\' 8"  (1.727 m)   Body mass index  is 50.18 kg/m.  Generalized: Well developed, in no acute distress  Chest: Lungs clear to auscultation bilaterally  Neurological examination  Mentation: Alert oriented to time, place, history taking. Follows all commands speech and language fluent Cranial nerve  II-XII: Extraocular movements were full, visual field were full on confrontational test Head turning and shoulder shrug  were normal and symmetric. Motor: The motor testing reveals 5 over 5 strength of all 4 extremities. Good symmetric motor tone is noted throughout.  Sensory: Sensory testing is intact to soft touch on all 4 extremities. No evidence of extinction is noted.  Gait and station: Gait is normal.    DIAGNOSTIC DATA (LABS, IMAGING, TESTING) - I reviewed patient records, labs, notes, testing and imaging myself where available.  Lab Results  Component Value Date   WBC 4.7 05/27/2019   HGB 14.5 05/27/2019   HCT 42.9 05/27/2019   MCV 91.5 05/27/2019   PLT 211 05/27/2019      Component Value Date/Time   NA 138 05/27/2019 1940   NA 141 04/16/2019 0941   K 3.0 (L) 05/27/2019 1940   CL 99 05/27/2019 1940   CO2 28 05/27/2019 1940   GLUCOSE 136 (H) 05/27/2019 1940   BUN 19 05/27/2019 1940   BUN 27 (H) 04/16/2019 0941   CREATININE 1.06 (H) 05/27/2019 1940   CREATININE 0.85 09/08/2015 1514   CALCIUM 10.4 (H) 05/27/2019 1940   PROT 7.6 05/27/2019 1940   PROT 6.8 04/16/2019 0941   ALBUMIN 4.4 05/27/2019 1940   ALBUMIN 4.4 04/16/2019 0941   AST 66 (H) 05/27/2019 1940   ALT 102 (H) 05/27/2019 1940   ALKPHOS 156 (H) 05/27/2019 1940   BILITOT 1.4 (H) 05/27/2019 1940   BILITOT 0.6 04/16/2019 0941   GFRNONAA >60 05/27/2019 1940   GFRNONAA 85 09/08/2015 1514   GFRAA >60 05/27/2019 1940   GFRAA >89 09/08/2015 1514   Lab Results  Component Value Date   CHOL 220 (H) 04/16/2019   HDL 63 04/16/2019   LDLCALC 122 (H) 04/16/2019   TRIG 200 (H) 04/16/2019   CHOLHDL 7.0 (H) 07/26/2018   Lab Results  Component Value Date   HGBA1C 5.6 04/16/2019   Lab Results  Component Value Date   VITAMINB12 792 05/23/2017   Lab Results  Component Value Date   TSH 3.950 12/24/2018      ASSESSMENT AND PLAN 44 y.o. year old female  has a past medical history of Allergy, Anemia, Anxiety,  Back pain, Constipation, Depression, Fatty liver, GERD (gastroesophageal reflux disease), Hyperlipidemia, Infertility, female, Joint pain, Kidney problem, Membranous nephrosis, Migraines, Multiple food allergies, Muscle pain, Numbness and tingling in both hands, Pre-diabetes, Shortness of breath, Sleep apnea, Stress, and Swelling of both lower extremities. here with:  1. OSA on BiPAP  - BiPAP compliance excellent - Good treatment of AHI  - Encourage patient to use CPAP nightly and > 4 hours each night - F/U in 1 year or sooner if needed   I spent 20 minutes of face-to-face and non-face-to-face time with patient.  This included previsit chart review, lab review, study review, order entry, electronic health record documentation, patient education.  Butch Penny, MSN, NP-C 08/20/2019, 2:50 PM [facility] [address] [place], [place] [address] [phone]   I reviewed the above note and documentation by the Nurse Practitioner and agree with the history, exam, assessment and plan as outlined above. I was available for consultation. Huston Foley, MD, PhD [facility])

## 2019-08-20 NOTE — Telephone Encounter (Signed)
Noted. Thanks.

## 2019-08-28 ENCOUNTER — Telehealth (INDEPENDENT_AMBULATORY_CARE_PROVIDER_SITE_OTHER): Payer: Self-pay | Admitting: Psychology

## 2019-08-28 ENCOUNTER — Telehealth (INDEPENDENT_AMBULATORY_CARE_PROVIDER_SITE_OTHER): Payer: BC Managed Care – PPO | Admitting: Psychology

## 2019-08-28 ENCOUNTER — Encounter (INDEPENDENT_AMBULATORY_CARE_PROVIDER_SITE_OTHER): Payer: Self-pay

## 2019-08-28 ENCOUNTER — Other Ambulatory Visit: Payer: Self-pay

## 2019-08-28 NOTE — Telephone Encounter (Signed)
  Office: [phone]  /  Fax: [phone]  Date of Call: August 28, 2019  Time of Call: 8:02am Provider: Glennie Isle, PsyD  CONTENT: This provider called Lattie Haw to check-in as she did not present for today's MyChart Video Visit appointment at 8:00am. A HIPAA compliant voicemail was left requesting a call back. Of note, this provider stayed on the MyChart Video Visit appointment for 5 minutes prior to signing off per the clinic's grace period policy.    PLAN: This provider will wait for Jalacia to call back. No further follow-up planned by this provider.

## 2019-08-30 ENCOUNTER — Other Ambulatory Visit (INDEPENDENT_AMBULATORY_CARE_PROVIDER_SITE_OTHER): Payer: Self-pay | Admitting: Physician Assistant

## 2019-08-30 DIAGNOSIS — E039 Hypothyroidism, unspecified: Secondary | ICD-10-CM

## 2019-09-01 ENCOUNTER — Encounter (INDEPENDENT_AMBULATORY_CARE_PROVIDER_SITE_OTHER): Payer: Self-pay | Admitting: Physician Assistant

## 2019-09-01 ENCOUNTER — Ambulatory Visit (INDEPENDENT_AMBULATORY_CARE_PROVIDER_SITE_OTHER): Payer: BC Managed Care – PPO | Admitting: Physician Assistant

## 2019-09-01 ENCOUNTER — Other Ambulatory Visit: Payer: Self-pay

## 2019-09-01 VITALS — BP 116/75 | HR 68 | Temp 98.9°F | Ht 68.0 in | Wt 324.0 lb

## 2019-09-01 DIAGNOSIS — R7303 Prediabetes: Secondary | ICD-10-CM | POA: Diagnosis not present

## 2019-09-01 DIAGNOSIS — E038 Other specified hypothyroidism: Secondary | ICD-10-CM | POA: Diagnosis not present

## 2019-09-01 DIAGNOSIS — Z9189 Other specified personal risk factors, not elsewhere classified: Secondary | ICD-10-CM | POA: Diagnosis not present

## 2019-09-01 DIAGNOSIS — Z6841 Body Mass Index (BMI) 40.0 and over, adult: Secondary | ICD-10-CM

## 2019-09-01 DIAGNOSIS — E559 Vitamin D deficiency, unspecified: Secondary | ICD-10-CM | POA: Diagnosis not present

## 2019-09-01 DIAGNOSIS — K219 Gastro-esophageal reflux disease without esophagitis: Secondary | ICD-10-CM

## 2019-09-01 MED ORDER — VITAMIN D (ERGOCALCIFEROL) 1.25 MG (50000 UNIT) PO CAPS: 50000.0000 [IU] | ORAL_CAPSULE | ORAL | 0 refills | Status: DC

## 2019-09-01 MED ORDER — OMEPRAZOLE 20 MG PO CPDR: 20.0000 mg | DELAYED_RELEASE_CAPSULE | Freq: Every day | ORAL | 0 refills | Status: DC

## 2019-09-01 MED ORDER — METFORMIN HCL 500 MG PO TABS: 500.0000 mg | ORAL_TABLET | Freq: Two times a day (BID) | ORAL | 0 refills | Status: DC

## 2019-09-01 MED ORDER — LEVOTHYROXINE SODIUM 88 MCG PO TABS: 88.0000 ug | ORAL_TABLET | Freq: Every day | ORAL | 0 refills | Status: DC

## 2019-09-01 NOTE — Progress Notes (Signed)
  Office: [phone]  /  Fax: [phone]    Date: September 02, 2019   Appointment Start Time: 8:00am Duration: 26 minutes Provider: Glennie Isle, Psy.D. Type of Session: Individual Therapy  Location of Patient: Parked in car at [facility]  Location of Provider: Provider's Home Type of Contact: Telepsychological Visit via MyChart Video Visit  Session Content: Melanie [name] is a 44 y.o. female presenting via [place] Visit for a follow-up appointment to address the previously established treatment goal of increasing coping skills. Today's appointment was a telepsychological visit due to COVID-19. Melanie [name] provided verbal consent for today's telepsychological appointment and she is aware she is responsible for securing confidentiality on her end of the session. Prior to proceeding with today's appointment, Melanie [name]'s physical location at the time of this appointment was obtained as well a phone number she could be reached at in the event of technical difficulties. Melanie [name] and this provider participated in today's telepsychological service.   This provider conducted a brief check-in. Melanie [name] shared ongoing worry regarding her daughter's well-being, noting she is hospitalized. She shared challenges with coping with the aforementioned. Associated thoughts and feelings were processed. Melanie [name] indicated she continues to meet with her other therapist, noting their next appointment is today. This provider discussed concern about insurance coverage. Melanie [name] stated she would call her other therapist. Regarding eating, she shared, "It's been a mess." She acknowledged deviations from the meal plan and skipping meals. Psychoeducation regarding the consequences of not eating regularly was provided. She was receptive to taking snacks with her when she is running errands and/or visiting her daughter. Additionally, she expressed desire to continue with her other therapist at this time due to ongoing stressors and busy schedule.  Remainder of session focused on self-care. Melanie [name] was receptive to today's appointment as evidenced by openness to sharing, responsiveness to feedback, and willingness to continue engaging in pleasurable activities.  Mental Status Examination:  Appearance: well groomed and appropriate hygiene  Behavior: appropriate to circumstances Mood: sad Affect: mood congruent Speech: normal in rate, volume, and tone Eye Contact: appropriate Psychomotor Activity: appropriate Gait: unable to assess Thought Process: linear, logical, and goal directed  Thought Content/Perception: no hallucinations, delusions, bizarre thinking or behavior reported or observed and no evidence of suicidal and homicidal ideation, plan, and intent Orientation: time, person, place, and purpose of appointment Memory/Concentration: memory, attention, language, and fund of knowledge intact  Insight/Judgment: good  Interventions:  Conducted a brief chart review Provided empathic reflections and validation Reviewed content from the previous session Employed supportive psychotherapy interventions to facilitate reduced distress and to improve coping skills with identified stressors Employed motivational interviewing skills to assess patient's willingness/desire to adhere to recommended medical treatments and assignments  DSM-5 Diagnosis(es): 296.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 Melanie [name] has demonstrated progress in her goal as evidenced by increased awareness of hunger patterns.   Plan: Melanie [name] declined future appointments with this provider at this time due to her current schedule. She acknowledged understanding that she may request a follow-up appointment with this provider in the future as long as she is still established with the clinic. No  further follow-up planned by this provider.

## 2019-09-02 ENCOUNTER — Other Ambulatory Visit: Payer: Self-pay

## 2019-09-02 ENCOUNTER — Telehealth (INDEPENDENT_AMBULATORY_CARE_PROVIDER_SITE_OTHER): Payer: BC Managed Care – PPO | Admitting: Psychology

## 2019-09-02 DIAGNOSIS — F330 Major depressive disorder, recurrent, mild: Secondary | ICD-10-CM

## 2019-09-02 NOTE — Progress Notes (Signed)
Chief Complaint:   OBESITY Melanie [name] is here to discuss her progress with her obesity treatment plan along with follow-up of her obesity related diagnoses. Melanie [name] is on the Category 2 Plan and states she is following her eating plan approximately 60% of the time. Melanie [name] states she is exercising 0 minutes 0 times per week.  Today's visit was #: 40 Starting weight: 338 lbs Starting date: 05/23/2017 Today's weight: 324 lbs Today's date: 09/01/2019 Total lbs lost to date: 14 Total lbs lost since last in-office visit: 0  Interim History: Melanie [name] reports that she has been doing some stress and comfort eating with her daughter going back into the hospital.  Subjective:   Vitamin D deficiency. No nausea, vomiting, or muscle weakness on prescription Vitamin D. Last Vitamin D was 43.7 on 04/16/2019.  Other specified hypothyroidism. Melanie [name] is on levothyroxine. She reports no excessive fatigue.  Lab Results  Component Value Date   TSH 3.950 12/24/2018   Prediabetes. Melanie [name] has a diagnosis of prediabetes based on her elevated HgA1c and was informed this puts her at greater risk of developing diabetes. She continues to work on diet and exercise to decrease her risk of diabetes. No nausea, vomiting, or diarrhea on metformin. No polyphagia.  Lab Results  Component Value Date   HGBA1C 5.6 04/16/2019   Lab Results  Component Value Date   INSULIN 72.8 (H) 04/16/2019   INSULIN 46.3 (H) 12/24/2018   INSULIN 39.2 (H) 07/25/2018   INSULIN 30.4 (H) 05/01/2018   INSULIN 39.0 (H) 12/13/2017   Gastroesophageal reflux disease, unspecified whether esophagitis present. Melanie [name] is on Prilosec and reports no reflux symptoms.  At risk for diabetes mellitus. Melanie [name] is at higher than average risk for developing diabetes due to her obesity.   Assessment/Plan:   Vitamin D deficiency. Low Vitamin D level contributes to fatigue and are associated with obesity, breast, and colon cancer. She was given a refill on her  Vitamin D, Ergocalciferol, (DRISDOL) 1.25 MG (50000 UNIT) CAPS capsule 2 times a week #10 with 0 refills and will follow-up for routine testing of Vitamin D, at least 2-3 times per year to avoid over-replacement.   Other specified hypothyroidism. Patient with long-standing hypothyroidism, on levothyroxine therapy. She appears euthyroid. Orders and follow up as documented in patient record. Refill was given for levothyroxine (SYNTHROID) 88 MCG tablet #90 with 0 refills.  Counseling . Good thyroid control is important for overall health. Supratherapeutic thyroid levels are dangerous and will not improve weight loss results. . The correct way to take levothyroxine is fasting, with water, separated by at least 30 minutes from breakfast, and separated by more than 4 hours from calcium, iron, multivitamins, acid reflux medications (PPIs).    Prediabetes. Melanie [name] will continue to work on weight loss, exercise, and decreasing simple carbohydrates to help decrease the risk of diabetes. Refill was given for metFORMIN (GLUCOPHAGE) 500 MG tablet #60 with 0 refills.  Gastroesophageal reflux disease, unspecified whether esophagitis present. Intensive lifestyle modifications are the first line treatment for this issue. We discussed several lifestyle modifications today and she will continue to work on diet, exercise and weight loss efforts. Orders and follow up as documented in patient record. Refill was given for omeprazole (PRILOSEC) 20 MG capsule #90 with 0 refills.  Counseling . If a person has gastroesophageal reflux disease (GERD), food and stomach acid move back up into the esophagus and cause symptoms or problems such as damage to the esophagus. . Anti-reflux measures include: raising  the head of the bed, avoiding tight clothing or belts, avoiding eating late at night, not lying down shortly after mealtime, and achieving weight loss. . Avoid ASA, NSAID's, caffeine, alcohol, and tobacco.  . OTC Pepcid and/or  Tums are often very helpful for as needed use.  Marland Kitchen However, for persisting chronic or daily symptoms, stronger medications like Omeprazole may be needed. . You may need to avoid foods and drinks such as: ? Coffee and tea (with or without caffeine). ? Drinks that contain alcohol. ? Energy drinks and sports drinks. ? Bubbly (carbonated) drinks or sodas. ? Chocolate and cocoa. ? Peppermint and mint flavorings. ? Garlic and onions. ? Horseradish. ? Spicy and acidic foods. These include peppers, chili powder, curry powder, vinegar, hot sauces, and BBQ sauce. ? Citrus fruit juices and citrus fruits, such as oranges, lemons, and limes. ? Tomato-based foods. These include red sauce, chili, salsa, and pizza with red sauce. ? Fried and fatty foods. These include donuts, french fries, potato chips, and high-fat dressings. ? High-fat meats. These include hot dogs, rib eye steak, sausage, ham, and bacon.   At risk for diabetes mellitus. Melanie [name] was given approximately 15 minutes of diabetes education and counseling today. We discussed intensive lifestyle modifications today with an emphasis on weight loss as well as increasing exercise and decreasing simple carbohydrates in her diet. We also reviewed medication options with an emphasis on risk versus benefit of those discussed.   Repetitive spaced learning was employed today to elicit superior memory formation and behavioral change.  Class 3 severe obesity with serious comorbidity and body mass index (BMI) of 45.0 to 49.9 in adult, unspecified obesity type (Melanie [name]).  Melanie [name] is currently in the action stage of change. As such, her goal is to continue with weight loss efforts. She has agreed to the Category 2 Plan.   Exercise goals: For substantial health benefits, adults should do at least 150 minutes (2 hours and 30 minutes) a week of moderate-intensity, or 75 minutes (1 hour and 15 minutes) a week of vigorous-intensity aerobic physical activity, or an equivalent  combination of moderate- and vigorous-intensity aerobic activity. Aerobic activity should be performed in episodes of at least 10 minutes, and preferably, it should be spread throughout the week.  Behavioral modification strategies: meal planning and cooking strategies and keeping healthy foods in the home.  Melanie [name] has agreed to follow-up with our clinic in 3 weeks. She was informed of the importance of frequent follow-up visits to maximize her success with intensive lifestyle modifications for her multiple health conditions.   Objective:   Blood pressure 116/75, pulse 68, temperature 98.9 F (37.2 C), temperature source Oral, height 5' 8"  (1.727 m), weight (!) 324 lb (147 kg), SpO2 97 %. Body mass index is 49.26 kg/m.  General: Cooperative, alert, well developed, in no acute distress. HEENT: Conjunctivae and lids unremarkable. Cardiovascular: Regular rhythm.  Lungs: Normal work of breathing. Neurologic: No focal deficits.   Lab Results  Component Value Date   CREATININE 1.06 (H) 05/27/2019   BUN 19 05/27/2019   NA 138 05/27/2019   K 3.0 (L) 05/27/2019   CL 99 05/27/2019   CO2 28 05/27/2019   Lab Results  Component Value Date   ALT 102 (H) 05/27/2019   AST 66 (H) 05/27/2019   ALKPHOS 156 (H) 05/27/2019   BILITOT 1.4 (H) 05/27/2019   Lab Results  Component Value Date   HGBA1C 5.6 04/16/2019   HGBA1C 5.6 12/24/2018   HGBA1C 5.5 07/25/2018  HGBA1C 5.5 05/01/2018   HGBA1C 5.6 12/13/2017   Lab Results  Component Value Date   INSULIN 72.8 (H) 04/16/2019   INSULIN 46.3 (H) 12/24/2018   INSULIN 39.2 (H) 07/25/2018   INSULIN 30.4 (H) 05/01/2018   INSULIN 39.0 (H) 12/13/2017   Lab Results  Component Value Date   TSH 3.950 12/24/2018   Lab Results  Component Value Date   CHOL 220 (H) 04/16/2019   HDL 63 04/16/2019   LDLCALC 122 (H) 04/16/2019   TRIG 200 (H) 04/16/2019   CHOLHDL 7.0 (H) 07/26/2018   Lab Results  Component Value Date   WBC 4.7 05/27/2019   HGB 14.5  05/27/2019   HCT 42.9 05/27/2019   MCV 91.5 05/27/2019   PLT 211 05/27/2019   Lab Results  Component Value Date   IRON 95 06/27/2013   TIBC 371 06/27/2013   FERRITIN 401 (H) 06/27/2013   Attestation Statements:   Reviewed by clinician on day of visit: allergies, medications, problem list, medical history, surgical history, family history, social history, and previous encounter notes.  IMichaelene Song, am acting as transcriptionist for Abby Potash, PA-C   I have reviewed the above documentation for accuracy and completeness, and I agree with the above. Abby Potash, PA-C

## 2019-09-19 ENCOUNTER — Other Ambulatory Visit: Payer: Self-pay

## 2019-09-19 ENCOUNTER — Ambulatory Visit (INDEPENDENT_AMBULATORY_CARE_PROVIDER_SITE_OTHER): Payer: BC Managed Care – PPO | Admitting: Registered Nurse

## 2019-09-19 DIAGNOSIS — Z13228 Encounter for screening for other metabolic disorders: Secondary | ICD-10-CM

## 2019-09-19 DIAGNOSIS — Z1329 Encounter for screening for other suspected endocrine disorder: Secondary | ICD-10-CM

## 2019-09-19 DIAGNOSIS — Z13220 Encounter for screening for lipoid disorders: Secondary | ICD-10-CM

## 2019-09-19 DIAGNOSIS — Z130 Encounter for screening for diseases of the blood and blood-forming organs and certain disorders involving the immune mechanism: Secondary | ICD-10-CM

## 2019-09-20 ENCOUNTER — Other Ambulatory Visit (INDEPENDENT_AMBULATORY_CARE_PROVIDER_SITE_OTHER): Payer: Self-pay | Admitting: Physician Assistant

## 2019-09-20 DIAGNOSIS — R7303 Prediabetes: Secondary | ICD-10-CM

## 2019-09-20 LAB — CBC WITH DIFFERENTIAL/PLATELET
Basophils Absolute: 0 10*3/uL (ref 0.0–0.2)
Basos: 1 %
EOS (ABSOLUTE): 0.2 10*3/uL (ref 0.0–0.4)
Eos: 3 %
Hematocrit: 38.9 % (ref 34.0–46.6)
Hemoglobin: 13.5 g/dL (ref 11.1–15.9)
Immature Grans (Abs): 0.1 10*3/uL (ref 0.0–0.1)
Immature Granulocytes: 1 %
Lymphocytes Absolute: 2.1 10*3/uL (ref 0.7–3.1)
Lymphs: 34 %
MCH: 32.1 pg (ref 26.6–33.0)
MCHC: 34.7 g/dL (ref 31.5–35.7)
MCV: 92 fL (ref 79–97)
Monocytes Absolute: 0.4 10*3/uL (ref 0.1–0.9)
Monocytes: 6 %
Neutrophils Absolute: 3.4 10*3/uL (ref 1.4–7.0)
Neutrophils: 55 %
Platelets: 185 10*3/uL (ref 150–450)
RBC: 4.21 x10E6/uL (ref 3.77–5.28)
RDW: 12.4 % (ref 11.7–15.4)
WBC: 6.2 10*3/uL (ref 3.4–10.8)

## 2019-09-20 LAB — CMP14+EGFR
ALT: 79 [IU]/L — ABNORMAL HIGH (ref 0–32)
AST: 61 [IU]/L — ABNORMAL HIGH (ref 0–40)
Albumin/Globulin Ratio: 2.1 (ref 1.2–2.2)
Albumin: 4.7 g/dL (ref 3.8–4.8)
Alkaline Phosphatase: 152 [IU]/L — ABNORMAL HIGH (ref 48–121)
BUN/Creatinine Ratio: 21 (ref 9–23)
BUN: 24 mg/dL (ref 6–24)
Bilirubin Total: 0.9 mg/dL (ref 0.0–1.2)
CO2: 27 mmol/L (ref 20–29)
Calcium: 11.1 mg/dL — ABNORMAL HIGH (ref 8.7–10.2)
Chloride: 98 mmol/L (ref 96–106)
Creatinine, Ser: 1.17 mg/dL — ABNORMAL HIGH (ref 0.57–1.00)
GFR calc Af Amer: 65 mL/min/{1.73_m2}
GFR calc non Af Amer: 56 mL/min/{1.73_m2} — ABNORMAL LOW
Globulin, Total: 2.2 g/dL (ref 1.5–4.5)
Glucose: 137 mg/dL — ABNORMAL HIGH (ref 65–99)
Potassium: 3.5 mmol/L (ref 3.5–5.2)
Sodium: 139 mmol/L (ref 134–144)
Total Protein: 6.9 g/dL (ref 6.0–8.5)

## 2019-09-20 LAB — LIPID PANEL
Chol/HDL Ratio: 3.7 ratio (ref 0.0–4.4)
Cholesterol, Total: 220 mg/dL — ABNORMAL HIGH (ref 100–199)
HDL: 59 mg/dL (ref >39)
LDL Chol Calc (NIH): 128 mg/dL — ABNORMAL HIGH (ref 0–99)
Triglycerides: 187 mg/dL — ABNORMAL HIGH (ref 0–149)
VLDL Cholesterol Cal: 33 mg/dL (ref 5–40)

## 2019-09-20 LAB — TSH: TSH: 0.58 u[IU]/mL (ref 0.450–4.500)

## 2019-09-20 LAB — HEMOGLOBIN A1C
Est. average glucose Bld gHb Est-mCnc: 120 mg/dL
Hgb A1c MFr Bld: 5.8 % — ABNORMAL HIGH (ref 4.8–5.6)

## 2019-09-21 ENCOUNTER — Encounter: Payer: Self-pay | Admitting: Registered Nurse

## 2019-09-23 ENCOUNTER — Other Ambulatory Visit: Payer: Self-pay

## 2019-09-23 ENCOUNTER — Ambulatory Visit (INDEPENDENT_AMBULATORY_CARE_PROVIDER_SITE_OTHER): Payer: BC Managed Care – PPO | Admitting: Family Medicine

## 2019-09-23 ENCOUNTER — Encounter (INDEPENDENT_AMBULATORY_CARE_PROVIDER_SITE_OTHER): Payer: Self-pay | Admitting: Family Medicine

## 2019-09-23 VITALS — BP 113/72 | HR 64 | Temp 97.8°F | Ht 68.0 in | Wt 327.0 lb

## 2019-09-23 DIAGNOSIS — N1831 Chronic kidney disease, stage 3a: Secondary | ICD-10-CM | POA: Diagnosis not present

## 2019-09-23 DIAGNOSIS — R7303 Prediabetes: Secondary | ICD-10-CM | POA: Diagnosis not present

## 2019-09-23 DIAGNOSIS — E782 Mixed hyperlipidemia: Secondary | ICD-10-CM | POA: Diagnosis not present

## 2019-09-23 DIAGNOSIS — E038 Other specified hypothyroidism: Secondary | ICD-10-CM

## 2019-09-23 DIAGNOSIS — Z9189 Other specified personal risk factors, not elsewhere classified: Secondary | ICD-10-CM

## 2019-09-23 DIAGNOSIS — E66813 Obesity, class 3: Secondary | ICD-10-CM

## 2019-09-23 DIAGNOSIS — Z6841 Body Mass Index (BMI) 40.0 and over, adult: Secondary | ICD-10-CM

## 2019-09-23 DIAGNOSIS — K760 Fatty (change of) liver, not elsewhere classified: Secondary | ICD-10-CM

## 2019-09-24 ENCOUNTER — Encounter: Payer: BC Managed Care – PPO | Admitting: Registered Nurse

## 2019-09-24 NOTE — Progress Notes (Signed)
Chief Complaint:   OBESITY Melanie [name] is here to discuss her progress with her obesity treatment plan along with follow-up of her obesity related diagnoses. Melanie [name] is on the Category 2 Plan and states she is following her eating plan approximately 75% of the time. Melanie [name] states she is exercising for 0 minutes 0 times per week.  Today's visit was #: 106 Starting weight: 338 lbs Starting date: 05/23/2017 Today's weight: 327 lbs Today's date: 09/23/2019 Total lbs lost to date: 11 lbs Total lbs lost since last in-office visit: 0  Interim History: Melanie [name] provided the following food recall today:  Breakfast:  Eggs, yogurt, cottage cheese. Lunch:  Sandwiches. Dinner:  Does not like to eat much at dinner.  Melanie [name] is interested in bariatric surgery.  Subjective:   1. Mixed hyperlipidemia Melanie [name] has hyperlipidemia and has been trying to improve her cholesterol levels with intensive lifestyle modification including a low saturated fat diet, exercise and weight loss. She denies any chest pain, claudication or myalgias.  Lab Results  Component Value Date   ALT 79 (H) 09/19/2019   AST 61 (H) 09/19/2019   ALKPHOS 152 (H) 09/19/2019   BILITOT 0.9 09/19/2019   Lab Results  Component Value Date   CHOL 220 (H) 09/19/2019   HDL 59 09/19/2019   LDLCALC 128 (H) 09/19/2019   TRIG 187 (H) 09/19/2019   CHOLHDL 3.7 09/19/2019   2. Prediabetes Melanie [name] has a diagnosis of prediabetes based on her elevated HgA1c and was informed this puts her at greater risk of developing diabetes. She continues to work on diet and exercise to decrease her risk of diabetes. She denies nausea or hypoglycemia.  Lab Results  Component Value Date   HGBA1C 5.8 (H) 09/19/2019   Lab Results  Component Value Date   INSULIN 72.8 (H) 04/16/2019   INSULIN 46.3 (H) 12/24/2018   INSULIN 39.2 (H) 07/25/2018   INSULIN 30.4 (H) 05/01/2018   INSULIN 39.0 (H) 12/13/2017   3. Fatty liver Melanie [name] has fatty liver shown on ultrasound, which was  reviewed.  Her BMI is over 40. She denies abdominal pain or jaundice and has never been told of any liver problems in the past. She denies excessive alcohol intake.  Lab Results  Component Value Date   ALT 79 (H) 09/19/2019   AST 61 (H) 09/19/2019   ALKPHOS 152 (H) 09/19/2019   BILITOT 0.9 09/19/2019   4. Stage 3a chronic kidney disease Lab Results  Component Value Date   CREATININE 1.17 (H) 09/19/2019   CREATININE 1.06 (H) 05/27/2019   CREATININE 1.21 (H) 04/16/2019   Lab Results  Component Value Date   CREATININE 1.17 (H) 09/19/2019   BUN 24 09/19/2019   NA 139 09/19/2019   K 3.5 09/19/2019   CL 98 09/19/2019   CO2 27 09/19/2019   5. Other specified hypothyroidism Melanie [name] is taking levothyroxine 88 mcg daily.  Lab Results  Component Value Date   TSH 0.580 09/19/2019   6. At risk for heart disease Melanie [name] is at a higher than average risk for cardiovascular disease due to obesity.   Assessment/Plan:   1. Mixed hyperlipidemia Cardiovascular risk and specific lipid/LDL goals reviewed.  We discussed several lifestyle modifications today and Melanie [name] will continue to work on diet, exercise and weight loss efforts. Orders and follow up as documented in patient record.   Counseling Intensive lifestyle modifications are the first line treatment for this issue. . Dietary changes: Increase soluble fiber. Decrease simple carbohydrates. . Exercise changes: Moderate  to vigorous-intensity aerobic activity 150 minutes per week if tolerated. . Lipid-lowering medications: see documented in medical record.  2. Prediabetes Good blood sugar control is important to decrease the likelihood of diabetic complications such as nephropathy, neuropathy, limb loss, blindness, coronary artery disease, and death. Intensive lifestyle modification including diet, exercise and weight loss are the first line of treatment for diabetes.   3. Fatty liver We discussed the diagnosis of non-alcoholic fatty liver  disease today and how this condition is obesity related. Melanie [name] was educated the importance of weight loss. Melanie [name] agreed to continue with her weight loss efforts with healthier diet and exercise as an essential part of her treatment plan.  4. Stage 3a chronic kidney disease Lab results and trends reviewed. We discussed several lifestyle modifications today and she will continue to work on diet, exercise and weight loss efforts. Avoid nephrotoxic medications. Orders and follow up as documented in patient record.   Counseling . Chronic kidney disease (CKD) happens when the kidneys are damaged over a long period of time. . Most of the time, this condition does not go away, but it can usually be controlled. Steps must be taken to slow down the kidney damage or to stop it from getting worse. . Intensive lifestyle modifications are the first line treatment for this issue.  Marland Kitchen Avoid buying foods that are: processed, frozen, or prepackaged to avoid excess salt.  5. Other specified hypothyroidism Patient with long-standing hypothyroidism, on levothyroxine therapy. She appears euthyroid. Orders and follow up as documented in patient record.  Counseling . Good thyroid control is important for overall health. Supratherapeutic thyroid levels are dangerous and will not improve weight loss results. . The correct way to take levothyroxine is fasting, with water, separated by at least 30 minutes from breakfast, and separated by more than 4 hours from calcium, iron, multivitamins, acid reflux medications (PPIs).   6. At risk for heart disease Melanie [name] was given approximately 15 minutes of coronary artery disease prevention counseling today. She is 44 y.o. female and has risk factors for heart disease including obesity. We discussed intensive lifestyle modifications today with an emphasis on specific weight loss instructions and strategies.   Repetitive spaced learning was employed today to elicit superior memory  formation and behavioral change.  7. Class 3 severe obesity with serious comorbidity and body mass index (BMI) of 45.0 to 49.9 in adult, unspecified obesity type ([place])  Orders -Melanie [name] will be referred to Bariatric Surgery today.  A letter will be provided for her.  Melanie [name] is currently in the action stage of change. As such, her goal is to continue with weight loss efforts. She has agreed to the Category 2 Plan.   Exercise goals: For substantial health benefits, adults should do at least 150 minutes (2 hours and 30 minutes) a week of moderate-intensity, or 75 minutes (1 hour and 15 minutes) a week of vigorous-intensity aerobic physical activity, or an equivalent combination of moderate- and vigorous-intensity aerobic activity. Aerobic activity should be performed in episodes of at least 10 minutes, and preferably, it should be spread throughout the week.  Behavioral modification strategies: increasing lean protein intake.  Melanie [name] has agreed to follow-up with our clinic in 3 weeks. She was informed of the importance of frequent follow-up visits to maximize her success with intensive lifestyle modifications for her multiple health conditions.   Objective:   Blood pressure 113/72, pulse 64, temperature 97.8 F (36.6 C), temperature source Oral, height 5' 8"  (1.727 m), weight (!) 327 lb (  148.3 kg), SpO2 98 %. Body mass index is 49.72 kg/m.  General: Cooperative, alert, well developed, in no acute distress. HEENT: Conjunctivae and lids unremarkable. Cardiovascular: Regular rhythm.  Lungs: Normal work of breathing. Neurologic: No focal deficits.   Lab Results  Component Value Date   CREATININE 1.17 (H) 09/19/2019   BUN 24 09/19/2019   NA 139 09/19/2019   K 3.5 09/19/2019   CL 98 09/19/2019   CO2 27 09/19/2019   Lab Results  Component Value Date   ALT 79 (H) 09/19/2019   AST 61 (H) 09/19/2019   ALKPHOS 152 (H) 09/19/2019   BILITOT 0.9 09/19/2019   Lab Results  Component Value Date    HGBA1C 5.8 (H) 09/19/2019   HGBA1C 5.6 04/16/2019   HGBA1C 5.6 12/24/2018   HGBA1C 5.5 07/25/2018   HGBA1C 5.5 05/01/2018   Lab Results  Component Value Date   INSULIN 72.8 (H) 04/16/2019   INSULIN 46.3 (H) 12/24/2018   INSULIN 39.2 (H) 07/25/2018   INSULIN 30.4 (H) 05/01/2018   INSULIN 39.0 (H) 12/13/2017   Lab Results  Component Value Date   TSH 0.580 09/19/2019   Lab Results  Component Value Date   CHOL 220 (H) 09/19/2019   HDL 59 09/19/2019   LDLCALC 128 (H) 09/19/2019   TRIG 187 (H) 09/19/2019   CHOLHDL 3.7 09/19/2019   Lab Results  Component Value Date   WBC 6.2 09/19/2019   HGB 13.5 09/19/2019   HCT 38.9 09/19/2019   MCV 92 09/19/2019   PLT 185 09/19/2019   Lab Results  Component Value Date   IRON 95 06/27/2013   TIBC 371 06/27/2013   FERRITIN 401 (H) 06/27/2013   Attestation Statements:   Reviewed by clinician on day of visit: allergies, medications, problem list, medical history, surgical history, family history, social history, and previous encounter notes.  I, Water quality scientist, CMA, am acting as transcriptionist for Briscoe Deutscher, DO  I have reviewed the above documentation for accuracy and completeness, and I agree with the above. Briscoe Deutscher, DO

## 2019-09-28 ENCOUNTER — Other Ambulatory Visit (INDEPENDENT_AMBULATORY_CARE_PROVIDER_SITE_OTHER): Payer: Self-pay | Admitting: Physician Assistant

## 2019-09-28 DIAGNOSIS — E559 Vitamin D deficiency, unspecified: Secondary | ICD-10-CM

## 2019-09-29 ENCOUNTER — Encounter (INDEPENDENT_AMBULATORY_CARE_PROVIDER_SITE_OTHER): Payer: Self-pay | Admitting: Family Medicine

## 2019-10-07 ENCOUNTER — Telehealth: Payer: Self-pay | Admitting: Internal Medicine

## 2019-10-07 MED ORDER — VITAMIN D (ERGOCALCIFEROL) 1.25 MG (50000 UNIT) PO CAPS: 50000.0000 [IU] | ORAL_CAPSULE | ORAL | 0 refills | Status: DC

## 2019-10-07 NOTE — Telephone Encounter (Signed)
[employer] re-authorization form faxed to [place] [facility] @ [phone]

## 2019-10-10 MED ORDER — PRALUENT 150 MG/ML ~~LOC~~ SOAJ: 1.0000 | SUBCUTANEOUS | 11 refills | Status: DC

## 2019-10-10 MED ORDER — PITAVASTATIN CALCIUM 2 MG PO TABS: 1.0000 | ORAL_TABLET | Freq: Every day | ORAL | 11 refills | Status: DC

## 2019-10-10 NOTE — Addendum Note (Signed)
Addended by: Fidel Levy on: 10/10/2019 08:07 AM   Modules accepted: Orders

## 2019-10-10 NOTE — Telephone Encounter (Signed)
[employer] approved 10/08/2019 - 10/07/2020 per [facility]

## 2019-10-16 ENCOUNTER — Other Ambulatory Visit (INDEPENDENT_AMBULATORY_CARE_PROVIDER_SITE_OTHER): Payer: Self-pay

## 2019-10-16 DIAGNOSIS — R7303 Prediabetes: Secondary | ICD-10-CM

## 2019-10-16 MED ORDER — METFORMIN HCL 500 MG PO TABS: 500.0000 mg | ORAL_TABLET | Freq: Two times a day (BID) | ORAL | 0 refills | Status: DC

## 2019-10-20 ENCOUNTER — Ambulatory Visit (INDEPENDENT_AMBULATORY_CARE_PROVIDER_SITE_OTHER): Payer: BC Managed Care – PPO | Admitting: Physician Assistant

## 2019-10-20 ENCOUNTER — Encounter (INDEPENDENT_AMBULATORY_CARE_PROVIDER_SITE_OTHER): Payer: Self-pay | Admitting: Physician Assistant

## 2019-10-20 ENCOUNTER — Other Ambulatory Visit: Payer: Self-pay

## 2019-10-20 ENCOUNTER — Other Ambulatory Visit (INDEPENDENT_AMBULATORY_CARE_PROVIDER_SITE_OTHER): Payer: Self-pay | Admitting: Family Medicine

## 2019-10-20 VITALS — BP 110/72 | HR 60 | Temp 97.8°F | Ht 68.0 in | Wt 321.0 lb

## 2019-10-20 DIAGNOSIS — R7303 Prediabetes: Secondary | ICD-10-CM | POA: Diagnosis not present

## 2019-10-20 DIAGNOSIS — Z6841 Body Mass Index (BMI) 40.0 and over, adult: Secondary | ICD-10-CM

## 2019-10-20 DIAGNOSIS — I10 Essential (primary) hypertension: Secondary | ICD-10-CM | POA: Diagnosis not present

## 2019-10-20 DIAGNOSIS — Z9189 Other specified personal risk factors, not elsewhere classified: Secondary | ICD-10-CM | POA: Diagnosis not present

## 2019-10-20 NOTE — Progress Notes (Signed)
Chief Complaint:   Melanie [name] is here to discuss her progress with her Melanie treatment plan along with follow-up of her Melanie related diagnoses. Melanie [name] is on the Category 2 Plan and states she is following her eating plan approximately 60% of the time. [name] states she is exercising 0 minutes 0 times per week.  Today's visit was #: 107 Starting weight: 338 lbs Starting date: 05/23/2017 Today's weight: 321 lbs Today's date: 10/20/2019 Total lbs lost to date: 17 Total lbs lost since last in-office visit: 6  Interim History: Melanie [name] states that her daughter is now in an inpatient treatment facility and, therefore, she and her family are less stressed. She reports that she has been trying to follow the Category 2 meal plan, but is more so just eating "good food and less carbs."  Subjective:   Essential hypertension. Phyllistine is on chlorthalidone. No  chest pain or headache. Blood pressure is controlled.  BP Readings from Last 3 Encounters:  10/20/19 110/72  09/23/19 113/72  09/01/19 116/75   Lab Results  Component Value Date   CREATININE 1.17 (H) 09/19/2019   CREATININE 1.06 (H) 05/27/2019   CREATININE 1.21 (H) 04/16/2019   Melanie [name]. Melanie [name] has a diagnosis of Melanie [name] based on her elevated HgA1c and was informed this puts her at greater risk of developing diabetes. She continues to work on diet and exercise to decrease her risk of diabetes. Melanie [name] is on metformin. No nausea, vomiting, or diarrhea.   Lab Results  Component Value Date   HGBA1C 5.8 (H) 09/19/2019   Lab Results  Component Value Date   INSULIN 72.8 (H) 04/16/2019   INSULIN 46.3 (H) 12/24/2018   INSULIN 39.2 (H) 07/25/2018   INSULIN 30.4 (H) 05/01/2018   INSULIN 39.0 (H) 12/13/2017   At risk for diabetes mellitus. [name] is at higher than average risk for developing diabetes due to her Melanie.   Assessment/Plan:   Essential hypertension. Esta is working on healthy weight loss and exercise to improve  blood pressure control. We will watch for signs of hypotension as she continues her lifestyle modifications. Refill was given for chlorthalidone (HYGROTON) 25 MG tablet #90 with 0 refills.  Melanie [name]. Melanie [name] will continue to work on weight loss, exercise, and decreasing simple carbohydrates to help decrease the risk of diabetes. She will continue metformin as directed.   At risk for diabetes mellitus. Melanie [name] was given approximately 15 minutes of diabetes education and counseling today. We discussed intensive lifestyle modifications today with an emphasis on weight loss as well as increasing exercise and decreasing simple carbohydrates in her diet. We also reviewed medication options with an emphasis on risk versus benefit of those discussed.   Repetitive spaced learning was employed today to elicit superior memory formation and behavioral change.  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  Melanie [name] is currently in the action stage of change. As such, her goal is to continue with weight loss efforts. She has agreed to practicing portion control and making smarter food choices, such as increasing vegetables and decreasing simple carbohydrates.   Exercise goals: For substantial health benefits, adults should do at least 150 minutes (2 hours and 30 minutes) a week of moderate-intensity, or 75 minutes (1 hour and 15 minutes) a week of vigorous-intensity aerobic physical activity, or an equivalent combination of moderate- and vigorous-intensity aerobic activity. Aerobic activity should be performed in episodes of at  least 10 minutes, and preferably, it should be spread throughout the week.  Behavioral modification strategies: increasing lean protein intake and no skipping meals.  Melanie [name] has agreed to follow-up with our clinic in 4 weeks. She was informed of the importance of frequent follow-up visits to maximize her success  with intensive lifestyle modifications for her multiple health conditions.   Objective:   Blood pressure 110/72, pulse 60, temperature 97.8 F (36.6 C), temperature source Oral, height 5' 8"  (1.727 m), weight (!) 321 lb (145.6 kg), last menstrual period 08/12/2019, SpO2 98 %. Body mass index is 48.81 kg/m.  General: Cooperative, alert, well developed, in no acute distress. HEENT: Conjunctivae and lids unremarkable. Cardiovascular: Regular rhythm.  Lungs: Normal work of breathing. Neurologic: No focal deficits.   Lab Results  Component Value Date   CREATININE 1.17 (H) 09/19/2019   BUN 24 09/19/2019   NA 139 09/19/2019   K 3.5 09/19/2019   CL 98 09/19/2019   CO2 27 09/19/2019   Lab Results  Component Value Date   ALT 79 (H) 09/19/2019   AST 61 (H) 09/19/2019   ALKPHOS 152 (H) 09/19/2019   BILITOT 0.9 09/19/2019   Lab Results  Component Value Date   HGBA1C 5.8 (H) 09/19/2019   HGBA1C 5.6 04/16/2019   HGBA1C 5.6 12/24/2018   HGBA1C 5.5 07/25/2018   HGBA1C 5.5 05/01/2018   Lab Results  Component Value Date   INSULIN 72.8 (H) 04/16/2019   INSULIN 46.3 (H) 12/24/2018   INSULIN 39.2 (H) 07/25/2018   INSULIN 30.4 (H) 05/01/2018   INSULIN 39.0 (H) 12/13/2017   Lab Results  Component Value Date   TSH 0.580 09/19/2019   Lab Results  Component Value Date   CHOL 220 (H) 09/19/2019   HDL 59 09/19/2019   LDLCALC 128 (H) 09/19/2019   TRIG 187 (H) 09/19/2019   CHOLHDL 3.7 09/19/2019   Lab Results  Component Value Date   WBC 6.2 09/19/2019   HGB 13.5 09/19/2019   HCT 38.9 09/19/2019   MCV 92 09/19/2019   PLT 185 09/19/2019   Lab Results  Component Value Date   IRON 95 06/27/2013   TIBC 371 06/27/2013   FERRITIN 401 (H) 06/27/2013  ule her next follow up appointment today.  Attestation Statements:   Reviewed by clinician on day of visit: allergies, medications, problem list, medical history, surgical history, family history, social history, and previous  encounter notes.  IMichaelene [name], am acting as transcriptionist for Abby Potash, PA-C   I have reviewed the above documentation for accuracy and completeness, and I agree with the above. Abby Potash, PA-C

## 2019-10-21 MED ORDER — CHLORTHALIDONE 25 MG PO TABS: 25.0000 mg | ORAL_TABLET | Freq: Every day | ORAL | 0 refills | Status: DC

## 2019-11-10 ENCOUNTER — Other Ambulatory Visit: Payer: Self-pay

## 2019-11-10 ENCOUNTER — Encounter: Payer: Self-pay | Admitting: Registered Nurse

## 2019-11-10 ENCOUNTER — Ambulatory Visit (INDEPENDENT_AMBULATORY_CARE_PROVIDER_SITE_OTHER): Payer: BC Managed Care – PPO | Admitting: Registered Nurse

## 2019-11-10 DIAGNOSIS — Z9189 Other specified personal risk factors, not elsewhere classified: Secondary | ICD-10-CM

## 2019-11-10 DIAGNOSIS — Z23 Encounter for immunization: Secondary | ICD-10-CM

## 2019-11-10 DIAGNOSIS — Z0000 Encounter for general adult medical examination without abnormal findings: Secondary | ICD-10-CM | POA: Diagnosis not present

## 2019-11-10 DIAGNOSIS — Z5320 Procedure and treatment not carried out because of patient's decision for unspecified reasons: Secondary | ICD-10-CM

## 2019-11-10 NOTE — Progress Notes (Signed)
Established Patient Office Visit  Subjective:  Patient ID: Melanie [name], female    DOB: November 21, 1975  Age: 44 y.o. MRN: [id number]  CC:  Chief Complaint  Patient presents with  . Annual Exam    pt has been having muscle weakness in her arms and hands as well as trembling, pt als reports a spot on her shoulder that stings for 1 week pt has no burns red marks or other indicators feels surface level pt did have chicken pox as a child   . Referral    pt needs referral for GYN for PAP    HPI Melanie [name] presents for CPE  Requesting referral to gyn for pap  Labs have been done within the past 1-2 months - results encouraging, still seeing some mildly elevated LFTs which will be followed up on  Notes that she is having some weakness and tremors in both hands. At rest and with intention. No numbness, pain, tingling. No injury to the neck, back, or head that she's aware of. Slow onset. Has been seen by neurology for this - no clear answer. Has not seemed to progress recently.  Notes that she is scheduled to see a bariatric surgeon on Sept 9 for a consultation. She has been followed by [facility] for some time with limited results, more optimistic about surgical intervention at this time as she has established appropriate eating habits.  No other concerns or complaints at this time.   Past Medical History:  Diagnosis Date  . Allergy   . Anemia   . Anxiety   . Back pain   . Constipation   . COVID-19 05/27/2019  . Depression   . Fatty liver   . GERD (gastroesophageal reflux disease)   . Hyperlipidemia   . Infertility, female   . Joint pain   . Kidney problem   . 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 Migraines    LIGHT SENSITIVITY , NOW ON IMITREX SINCE 08-02-2018,NOTICES IMPROVEMENT   . Multiple food allergies   . Muscle pain   . Numbness and tingling in both hands   . Pre-diabetes   . Shortness of breath    was due to a medication side  effect, now off medication , SOB resolved   . Sleep apnea   . Stress   . Swelling of both lower extremities     Past Surgical History:  Procedure Laterality Date  . ELBOW LIGAMENT RECONSTRUCTION Right 08/21/2018   Procedure: LATERAL COLLATERAL LIGAMENT REPAIR;  Surgeon: Bjorn Pippin, MD;  Location: [facility];  Service: Orthopedics;  Laterality: Right;  . FINGER SURGERY Left     index finger  . KNEE SURGERY Left   . LIVER BIOPSY N/A 05/15/2014   Procedure: LIVER BIOPSY;  Surgeon: Louis Meckel, MD;  Location: [facility];  Service: Endoscopy;  Laterality: N/A;  ultrasound to mark the liver  . TENNIS ELBOW RELEASE/NIRSCHEL PROCEDURE Right 08/21/2018   Procedure: TENNIS ELBOW RELEASE/NIRSCHEL PROCEDURE;  Surgeon: Bjorn Pippin, MD;  Location: [facility];  Service: Orthopedics;  Laterality: Right;  . TONSILLECTOMY AND ADENOIDECTOMY  1978  . TYMPANOSTOMY TUBE PLACEMENT    . ULNAR NERVE TRANSPOSITION Right 08/21/2018   Procedure: ULNAR NERVE DECOMPRESSION/TRANSPOSITION;  Surgeon: Bjorn Pippin, MD;  Location: [facility];  Service: Orthopedics;  Laterality: Right;  . wrist cyst Left     Family History  Problem Relation Age of Onset  . Diabetes Mother   .  Liver disease Mother        ESLD- unknown etiology  . Obesity Mother   . Kidney disease Mother   . Diabetes Maternal Grandmother   . Diabetes Paternal Grandmother   . Breast cancer Paternal Grandmother   . Stroke Maternal Grandfather   . Hyperlipidemia Father     Social History   Socioeconomic History  . Marital status: Married    Spouse name: [name]  . Number of children: 5  . Years of education: in [place] program  . Highest education level: Bachelor's degree (e.g., BA, AB, BS)  Occupational History  . Occupation: Magazine features editor: [employer]    Comment: ESL  Tobacco Use  . Smoking status: Never Smoker  . Smokeless tobacco: Never Used  Vaping Use  . Vaping Use: Never used  Substance and Sexual Activity  . Alcohol  use: No    Alcohol/week: 0.0 standard drinks  . Drug use: No  . Sexual activity: Yes    Partners: Male    Birth control/protection: Post-menopausal  Other Topics Concern  . Not on file  Social History Narrative   Lives with her husband and their 5 adpoted children.    Adopted 2 sets of siblings, all with psychiatric diagnoses (ADHD, Asperger's, ODD, PTSD, depression, bipolar disorder).   Her husband has Asperger's, and works in disability services.   Occasionally drinks coke or pepsi    Social Determinants of Health   Financial Resource Strain:   . Difficulty of Paying Living Expenses: Not on file  Food Insecurity:   . Worried About Programme researcher, broadcasting/film/video in the Last Year: Not on file  . Ran Out of Food in the Last Year: Not on file  Transportation Needs:   . Lack of Transportation (Medical): Not on file  . Lack of Transportation (Non-Medical): Not on file  Physical Activity:   . Days of Exercise per Week: Not on file  . Minutes of Exercise per Session: Not on file  Stress:   . Feeling of Stress : Not on file  Social Connections:   . Frequency of Communication with Friends and Family: Not on file  . Frequency of Social Gatherings with Friends and Family: Not on file  . Attends Religious Services: Not on file  . Active Member of Clubs or Organizations: Not on file  . Attends Banker Meetings: Not on file  . Marital Status: Not on file  Intimate Partner Violence:   . Fear of Current or Ex-Partner: Not on file  . Emotionally Abused: Not on file  . Physically Abused: Not on file  . Sexually Abused: Not on file    Outpatient Medications Prior to Visit  Medication Sig Dispense Refill  . Alirocumab (PRALUENT) 150 MG/ML SOAJ Inject 1 Dose into the skin every 14 (fourteen) days. 2 pen 11  . buPROPion (WELLBUTRIN SR) 150 MG 12 hr tablet Take 1 tablet (150 mg total) by mouth 2 (two) times daily. 180 tablet 1  . busPIRone (BUSPAR) 15 MG tablet Take 1 tablet (15 mg total)  by mouth 2 (two) times daily. 180 tablet 3  . cetirizine (ZYRTEC) 10 MG tablet Take 10 mg by mouth at bedtime.     . chlorthalidone (HYGROTON) 25 MG tablet Take 1 tablet (25 mg total) by mouth daily. 90 tablet 0  . citalopram (CELEXA) 40 MG tablet Take 1 tablet by mouth daily. 90 tablet 1  . fluticasone (FLONASE) 50 MCG/ACT nasal spray Place 2  sprays into both nostrils daily. (Patient taking differently: Place 2 sprays into both nostrils daily as needed for allergies. ) 48 g 3  . levothyroxine (SYNTHROID) 88 MCG tablet Take 1 tablet (88 mcg total) by mouth daily before breakfast. 90 tablet 0  . LORazepam (ATIVAN) 1 MG tablet Take 1 tablet (1 mg total) by mouth 2 (two) times daily as needed for anxiety. 20 tablet 0  . metFORMIN (GLUCOPHAGE) 500 MG tablet Take 1 tablet (500 mg total) by mouth 2 (two) times daily with a meal. 60 tablet 0  . montelukast (SINGULAIR) 10 MG tablet Take 1 tablet by mouth at bedtime. 90 tablet 3  . Multiple Vitamin (MULTIVITAMIN) tablet Take 1 tablet by mouth daily.    . Omega-3 Fatty Acids (FISH OIL PO) Take 1 capsule by mouth 2 (two) times a day.     Marland Kitchen omeprazole (PRILOSEC) 20 MG capsule Take 1 capsule (20 mg total) by mouth daily. 90 capsule 0  . Pitavastatin Calcium 2 MG TABS Take 1 tablet (2 mg total) by mouth daily. 30 tablet 11  . SUMAtriptan (IMITREX) 50 MG tablet TAKE ONE TABLET BY MOUTH AT ONSET OF HEADACHE; MAY REPEAT ONE TABLET IN 2 HOURS IF NEEDED. (Patient taking differently: Take 50 mg by mouth See admin instructions. Take 1 tablet at onset of headache; may repeat one tablet in 2 hours if needed.) 10 tablet 0  . topiramate (TOPAMAX) 100 MG tablet Take 1 tablet (100 mg total) by mouth at bedtime. 90 tablet 1  . Ubrogepant (UBRELVY) 50 MG TABS Take 50 mg by mouth as needed (may repeat once after 2 hours). 20 tablet 3  . Vitamin D, Ergocalciferol, (DRISDOL) 1.25 MG (50000 UNIT) CAPS capsule Take 1 capsule (50,000 Units total) by mouth 2 (two) times a week. Tuesday  and Friday 10 capsule 0   No facility-administered medications prior to visit.    Allergies  Allergen Reactions  . Welchol [Colesevelam] Shortness Of Breath  . Lidocaine Other (See Comments)    Flu like sx's  . Relafen [Nabumetone] Other (See Comments)    migraine  . Celebrex [Celecoxib] Rash  . Sulfa Antibiotics Rash    ROS Review of Systems  Constitutional: Negative.   HENT: Negative.   Eyes: Negative.   Respiratory: Negative.   Cardiovascular: Negative.   Gastrointestinal: Negative.   Endocrine: Negative.   Genitourinary: Negative.   Musculoskeletal: Negative.   Skin: Negative.   Allergic/Immunologic: Negative.   Neurological: Positive for tremors and weakness.  Hematological: Negative.   Psychiatric/Behavioral: Negative.   All other systems reviewed and are negative.     Objective:    Physical Exam Vitals and nursing note reviewed.  Constitutional:      General: She is not in acute distress.    Appearance: Normal appearance. She is obese. She is not ill-appearing, toxic-appearing or diaphoretic.  HENT:     Head: Normocephalic and atraumatic.     Right Ear: Tympanic membrane, ear canal and external ear normal. There is no impacted cerumen.     Left Ear: Tympanic membrane, ear canal and external ear normal. There is no impacted cerumen.     Nose: Nose normal. No congestion or rhinorrhea.     Mouth/Throat:     Mouth: Mucous membranes are moist.     Pharynx: Oropharynx is clear. No oropharyngeal exudate or posterior oropharyngeal erythema.  Eyes:     General: No scleral icterus.       Right eye: No discharge.  Left eye: No discharge.     Extraocular Movements: Extraocular movements intact.     Conjunctiva/sclera: Conjunctivae normal.     Pupils: Pupils are equal, round, and reactive to light.  Neck:     Vascular: No carotid bruit.  Cardiovascular:     Rate and Rhythm: Normal rate and regular rhythm.     Pulses: Normal pulses.     Heart sounds:  Normal heart sounds. No murmur heard.  No friction rub. No gallop.   Pulmonary:     Effort: Pulmonary effort is normal. No respiratory distress.     Breath sounds: Normal breath sounds. No stridor. No wheezing, rhonchi or rales.  Chest:     Chest wall: No tenderness.  Abdominal:     General: Abdomen is flat. Bowel sounds are normal. There is no distension.     Palpations: Abdomen is soft. There is no mass.     Tenderness: There is no abdominal tenderness. There is no right CVA tenderness, left CVA tenderness, guarding or rebound.     Hernia: No hernia is present.  Genitourinary:    Comments: Deferred by patient Musculoskeletal:        General: Tenderness (C7-T1 mild ttp) present. No swelling, deformity or signs of injury. Normal range of motion.     Cervical back: Normal range of motion and neck supple. No rigidity or tenderness.     Right lower leg: No edema.     Left lower leg: No edema.  Lymphadenopathy:     Cervical: No cervical adenopathy.  Skin:    General: Skin is warm and dry.     Capillary Refill: Capillary refill takes less than 2 seconds.     Coloration: Skin is not jaundiced or pale.     Findings: No bruising, erythema, lesion or rash.  Neurological:     General: No focal deficit present.     Mental Status: She is alert and oriented to person, place, and time. Mental status is at baseline.     Cranial Nerves: No cranial nerve deficit.     Sensory: No sensory deficit.     Motor: No weakness.     Coordination: Coordination normal.     Gait: Gait normal.     Deep Tendon Reflexes: Reflexes normal.  Psychiatric:        Mood and Affect: Mood normal.        Behavior: Behavior normal.        Thought Content: Thought content normal.        Judgment: Judgment normal.     There were no vitals taken for this visit. Wt Readings from Last 3 Encounters:  10/20/19 (!) 321 lb (145.6 kg)  09/23/19 (!) 327 lb (148.3 kg)  09/01/19 (!) 324 lb (147 kg)     Health Maintenance  Due  Topic Date Due  . URINE MICROALBUMIN  Never done    There are no preventive care reminders to display for this patient.  Lab Results  Component Value Date   TSH 0.580 09/19/2019   Lab Results  Component Value Date   WBC 6.2 09/19/2019   HGB 13.5 09/19/2019   HCT 38.9 09/19/2019   MCV 92 09/19/2019   PLT 185 09/19/2019   Lab Results  Component Value Date   NA 139 09/19/2019   K 3.5 09/19/2019   CO2 27 09/19/2019   GLUCOSE 137 (H) 09/19/2019   BUN 24 09/19/2019   CREATININE 1.17 (H) 09/19/2019   BILITOT 0.9 09/19/2019  ALKPHOS 152 (H) 09/19/2019   AST 61 (H) 09/19/2019   ALT 79 (H) 09/19/2019   PROT 6.9 09/19/2019   ALBUMIN 4.7 09/19/2019   CALCIUM 11.1 (H) 09/19/2019   ANIONGAP 11 05/27/2019   Lab Results  Component Value Date   CHOL 220 (H) 09/19/2019   Lab Results  Component Value Date   HDL 59 09/19/2019   Lab Results  Component Value Date   LDLCALC 128 (H) 09/19/2019   Lab Results  Component Value Date   TRIG 187 (H) 09/19/2019   Lab Results  Component Value Date   CHOLHDL 3.7 09/19/2019   Lab Results  Component Value Date   HGBA1C 5.8 (H) 09/19/2019      Assessment & Plan:   Problem List Items Addressed This Visit    None    Visit Diagnoses    Annual physical exam    -  Primary   Papanicolaou smear declined       Relevant Orders   Ambulatory referral to Gynecology   At risk for diabetes mellitus       Relevant Orders   Microalbumin / creatinine urine ratio      No orders of the defined types were placed in this encounter.   Follow-up: No follow-ups on file.   PLAN  May be benign essential tremor. She should follow up with neuro, may benefit from MRI of spine given her tenderness and strong family history of spinal and other neurogenic abnormalities like thoracic outlet syndrome.   Otherwise, exam unremarkable  Labs reviewed with patient  Histories updated as warranted  Return in 1 year for CPE and labs.  [name], NP

## 2019-11-10 NOTE — Patient Instructions (Signed)
° ° ° °  If you have lab work done today you will be contacted with your lab results within the next 2 weeks.  If you have not heard from us then please contact us. The fastest way to get your results is to register for My Chart. ° ° °IF you received an x-ray today, you will receive an invoice from [facility]. Please contact [facility] at [phone] with questions or concerns regarding your invoice.  ° °IF you received labwork today, you will receive an invoice from [facility]. Please contact [facility] at [phone] with questions or concerns regarding your invoice.  ° °Our billing staff will not be able to assist you with questions regarding bills from these companies. ° °You will be contacted with the lab results as soon as they are available. The fastest way to get your results is to activate your My Chart account. Instructions are located on the last page of this paperwork. If you have not heard from us regarding the results in 2 weeks, please contact this office. °  ° ° ° °

## 2019-11-11 ENCOUNTER — Encounter (INDEPENDENT_AMBULATORY_CARE_PROVIDER_SITE_OTHER): Payer: Self-pay | Admitting: Physician Assistant

## 2019-11-11 ENCOUNTER — Other Ambulatory Visit (INDEPENDENT_AMBULATORY_CARE_PROVIDER_SITE_OTHER): Payer: Self-pay

## 2019-11-11 DIAGNOSIS — E559 Vitamin D deficiency, unspecified: Secondary | ICD-10-CM

## 2019-11-11 LAB — MICROALBUMIN / CREATININE URINE RATIO
Creatinine, Urine: 182.5 mg/dL
Microalb/Creat Ratio: 14 mg/g creat (ref 0–29)
Microalbumin, Urine: 26.1 ug/mL

## 2019-11-11 MED ORDER — VITAMIN D (ERGOCALCIFEROL) 1.25 MG (50000 UNIT) PO CAPS: 50000.0000 [IU] | ORAL_CAPSULE | ORAL | 0 refills | Status: DC

## 2019-11-11 NOTE — Telephone Encounter (Signed)
Refill sheet given to TA-CS

## 2019-11-18 ENCOUNTER — Telehealth (HOSPITAL_COMMUNITY): Payer: Self-pay | Admitting: Licensed Clinical Social Worker

## 2019-11-19 ENCOUNTER — Ambulatory Visit (INDEPENDENT_AMBULATORY_CARE_PROVIDER_SITE_OTHER): Payer: BC Managed Care – PPO | Admitting: Physician Assistant

## 2019-11-19 ENCOUNTER — Encounter (INDEPENDENT_AMBULATORY_CARE_PROVIDER_SITE_OTHER): Payer: Self-pay | Admitting: Physician Assistant

## 2019-11-19 ENCOUNTER — Other Ambulatory Visit: Payer: Self-pay

## 2019-11-19 VITALS — BP 94/50 | HR 64 | Temp 98.0°F | Ht 68.0 in

## 2019-11-19 DIAGNOSIS — Z9189 Other specified personal risk factors, not elsewhere classified: Secondary | ICD-10-CM

## 2019-11-19 DIAGNOSIS — Z6841 Body Mass Index (BMI) 40.0 and over, adult: Secondary | ICD-10-CM

## 2019-11-19 DIAGNOSIS — E038 Other specified hypothyroidism: Secondary | ICD-10-CM | POA: Diagnosis not present

## 2019-11-19 DIAGNOSIS — R7303 Prediabetes: Secondary | ICD-10-CM

## 2019-11-19 DIAGNOSIS — K219 Gastro-esophageal reflux disease without esophagitis: Secondary | ICD-10-CM

## 2019-11-19 DIAGNOSIS — I10 Essential (primary) hypertension: Secondary | ICD-10-CM

## 2019-11-19 MED ORDER — LEVOTHYROXINE SODIUM 88 MCG PO TABS: 88.0000 ug | ORAL_TABLET | Freq: Every day | ORAL | 0 refills | Status: DC

## 2019-11-19 MED ORDER — OMEPRAZOLE 20 MG PO CPDR: 20.0000 mg | DELAYED_RELEASE_CAPSULE | Freq: Every day | ORAL | 0 refills | Status: DC

## 2019-11-19 MED ORDER — CHLORTHALIDONE 25 MG PO TABS: 25.0000 mg | ORAL_TABLET | Freq: Every day | ORAL | 0 refills | Status: DC

## 2019-11-19 MED ORDER — BD PEN NEEDLE NANO 2ND GEN 32G X 4 MM MISC: 1.0000 | Freq: Every day | 0 refills | Status: DC

## 2019-11-19 MED ORDER — METFORMIN HCL 500 MG PO TABS: 500.0000 mg | ORAL_TABLET | Freq: Two times a day (BID) | ORAL | 0 refills | Status: DC

## 2019-11-19 MED ORDER — SAXENDA 18 MG/3ML ~~LOC~~ SOPN: 3.0000 mg | PEN_INJECTOR | Freq: Every day | SUBCUTANEOUS | 0 refills | Status: DC

## 2019-11-20 NOTE — Progress Notes (Signed)
Chief Complaint:   OBESITY Melanie [name] is here to discuss her progress with her obesity treatment plan along with follow-up of her obesity related diagnoses. Melanie [name] is on practicing portion control and making smarter food choices, such as increasing vegetables and decreasing simple carbohydrates and states she is following her eating plan approximately 75-80% of the time. Melanie [name] states she is walking for 20 minutes 2-3 times per week.  Today's visit was #: 72 Starting weight: 338 lbs Starting date: 05/23/2017 Today's weight: 324 lbs Today's date: 11/19/2019 Total lbs lost to date: 14 lbs Total lbs lost since last in-office visit: 0  Interim History: Melanie [name] reports that she feels like PC/Abanda is working well for her with everything going on right now.  She states that she is eating more fast food due to more travel.  She is seeing Bariatric Surgery tomorrow.  Subjective:   1. Other specified hypothyroidism Melanie [name] takes levothyroxine 88 mcg daily.  No excessive fatigue.  Taking levothyroxine as prescribed.  Lab Results  Component Value Date   TSH 0.580 09/19/2019   2. Essential hypertension Review: taking medications as instructed, no medication side effects noted, no chest pain on exertion, no dyspnea on exertion, no swelling of ankles.  She is on chlorthalidone.  Blood pressure is controlled.  BP Readings from Last 3 Encounters:  11/19/19 (!) 94/50  10/20/19 110/72  09/23/19 113/72   3. Prediabetes Melanie [name] has a diagnosis of prediabetes based on her elevated HgA1c and was informed this puts her at greater risk of developing diabetes. She continues to work on diet and exercise to decrease her risk of diabetes. She denies nausea or hypoglycemia.  On Glucophage.  No polyphagia.  Last A1c was 5.8.  Lab Results  Component Value Date   HGBA1C 5.8 (H) 09/19/2019   Lab Results  Component Value Date   INSULIN 72.8 (H) 04/16/2019   INSULIN 46.3 (H) 12/24/2018   INSULIN 39.2 (H) 07/25/2018    INSULIN 30.4 (H) 05/01/2018   INSULIN 39.0 (H) 12/13/2017   4. Gastroesophageal reflux disease, unspecified whether esophagitis present She is taking omeprazole.  Denies black stools.  No reflux symptoms.  5. At risk for heart disease Melanie [name] is at a higher than average risk for cardiovascular disease due to obesity.   Assessment/Plan:   1. Other specified hypothyroidism Patient with long-standing hypothyroidism, on levothyroxine therapy. She appears euthyroid. Orders and follow up as documented in patient record.  Counseling . Good thyroid control is important for overall health. Supratherapeutic thyroid levels are dangerous and will not improve weight loss results. . The correct way to take levothyroxine is fasting, with water, separated by at least 30 minutes from breakfast, and separated by more than 4 hours from calcium, iron, multivitamins, acid reflux medications (PPIs).   -Refill levothyroxine (SYNTHROID) 88 MCG tablet; Take 1 tablet (88 mcg total) by mouth daily before breakfast.  Dispense: 90 tablet; Refill: 0  2. Essential hypertension Melanie [name] is working on healthy weight loss and exercise to improve blood pressure control. We will watch for signs of hypotension as she continues her lifestyle modifications.  -Refill chlorthalidone (HYGROTON) 25 MG tablet; Take 1 tablet (25 mg total) by mouth daily.  Dispense: 90 tablet; Refill: 0  3. Prediabetes Melanie [name] will continue to work on weight loss, exercise, and decreasing simple carbohydrates to help decrease the risk of diabetes.   -Refill metFORMIN (GLUCOPHAGE) 500 MG tablet; Take 1 tablet (500 mg total) by mouth 2 (two) times daily with a meal.  Dispense: 60 tablet; Refill: 0  4. Gastroesophageal reflux disease, unspecified whether esophagitis present Intensive lifestyle modifications are the first line treatment for this issue. We discussed several lifestyle modifications today and she will continue to work on diet, exercise and weight  loss efforts. Orders and follow up as documented in patient record.   Counseling . If a person has gastroesophageal reflux disease (GERD), food and stomach acid move back up into the esophagus and cause symptoms or problems such as damage to the esophagus. . Anti-reflux measures include: raising the head of the bed, avoiding tight clothing or belts, avoiding eating late at night, not lying down shortly after mealtime, and achieving weight loss. . Avoid ASA, NSAID's, caffeine, alcohol, and tobacco.  . OTC Pepcid and/or Tums are often very helpful for as needed use.  Marland Kitchen However, for persisting chronic or daily symptoms, stronger medications like Omeprazole may be needed. . You may need to avoid foods and drinks such as: ? Coffee and tea (with or without caffeine). ? Drinks that contain alcohol. ? Energy drinks and sports drinks. ? Bubbly (carbonated) drinks or sodas. ? Chocolate and cocoa. ? Peppermint and mint flavorings. ? Garlic and onions. ? Horseradish. ? Spicy and acidic foods. These include peppers, chili powder, curry powder, vinegar, hot sauces, and BBQ sauce. ? Citrus fruit juices and citrus fruits, such as oranges, lemons, and limes. ? Tomato-based foods. These include red sauce, chili, salsa, and pizza with red sauce. ? Fried and fatty foods. These include donuts, french fries, potato chips, and high-fat dressings. ? High-fat meats. These include hot dogs, rib eye steak, sausage, ham, and bacon.  -Refill omeprazole (PRILOSEC) 20 MG capsule; Take 1 capsule (20 mg total) by mouth daily.  Dispense: 90 capsule; Refill: 0  5. At risk for heart disease Melanie [name] was given approximately 15 minutes of coronary artery disease prevention counseling today. She is 44 y.o. female and has risk factors for heart disease including obesity. We discussed intensive lifestyle modifications today with an emphasis on specific weight loss instructions and strategies.   Repetitive spaced learning was  employed today to elicit superior memory formation and behavioral change.  6. Class 3 severe obesity with serious comorbidity and body mass index (BMI) of 45.0 to 49.9 in adult, unspecified obesity type (Melanie [name])  -Start Liraglutide -Weight Management (SAXENDA) 18 MG/3ML SOPN; Inject 0.5 mLs (3 mg total) into the skin daily.  Dispense: 15 mL; Refill: 0 - Insulin Pen Needle (BD PEN NEEDLE NANO 2ND GEN) 32G X 4 MM MISC; 1 Package by Does not apply route daily.  Dispense: 100 each; Refill: 0  Melanie [name] is currently in the action stage of change. As such, her goal is to continue with weight loss efforts. She has agreed to practicing portion control and making smarter food choices, such as increasing vegetables and decreasing simple carbohydrates.   Exercise goals: For substantial health benefits, adults should do at least 150 minutes (2 hours and 30 minutes) a week of moderate-intensity, or 75 minutes (1 hour and 15 minutes) a week of vigorous-intensity aerobic physical activity, or an equivalent combination of moderate- and vigorous-intensity aerobic activity. Aerobic activity should be performed in episodes of at least 10 minutes, and preferably, it should be spread throughout the week.  Behavioral modification strategies: increasing water intake.  Melanie [name] has agreed to follow-up with our clinic in 4 weeks. She was informed of the importance of frequent follow-up visits to maximize her success with intensive lifestyle modifications for her multiple health conditions.  Objective:   Blood pressure (!) 94/50, pulse 64, temperature 98 F (36.7 C), height 5' 8"  (1.727 m), SpO2 97 %. Body mass index is 48.81 kg/m.  General: Cooperative, alert, well developed, in no acute distress. HEENT: Conjunctivae and lids unremarkable. Cardiovascular: Regular rhythm.  Lungs: Normal work of breathing. Neurologic: No focal deficits.   Lab Results  Component Value Date   CREATININE 1.17 (H) 09/19/2019   BUN 24  09/19/2019   NA 139 09/19/2019   K 3.5 09/19/2019   CL 98 09/19/2019   CO2 27 09/19/2019   Lab Results  Component Value Date   ALT 79 (H) 09/19/2019   AST 61 (H) 09/19/2019   ALKPHOS 152 (H) 09/19/2019   BILITOT 0.9 09/19/2019   Lab Results  Component Value Date   HGBA1C 5.8 (H) 09/19/2019   HGBA1C 5.6 04/16/2019   HGBA1C 5.6 12/24/2018   HGBA1C 5.5 07/25/2018   HGBA1C 5.5 05/01/2018   Lab Results  Component Value Date   INSULIN 72.8 (H) 04/16/2019   INSULIN 46.3 (H) 12/24/2018   INSULIN 39.2 (H) 07/25/2018   INSULIN 30.4 (H) 05/01/2018   INSULIN 39.0 (H) 12/13/2017   Lab Results  Component Value Date   TSH 0.580 09/19/2019   Lab Results  Component Value Date   CHOL 220 (H) 09/19/2019   HDL 59 09/19/2019   LDLCALC 128 (H) 09/19/2019   TRIG 187 (H) 09/19/2019   CHOLHDL 3.7 09/19/2019   Lab Results  Component Value Date   WBC 6.2 09/19/2019   HGB 13.5 09/19/2019   HCT 38.9 09/19/2019   MCV 92 09/19/2019   PLT 185 09/19/2019   Lab Results  Component Value Date   IRON 95 06/27/2013   TIBC 371 06/27/2013   FERRITIN 401 (H) 06/27/2013   Attestation Statements:   Reviewed by clinician on day of visit: allergies, medications, problem list, medical history, surgical history, family history, social history, and previous encounter notes.  I, Water quality scientist, CMA, am acting as transcriptionist for Abby Potash, PA-C  I have reviewed the above documentation for accuracy and completeness, and I agree with the above. Abby Potash, PA-C

## 2019-11-26 ENCOUNTER — Other Ambulatory Visit (INDEPENDENT_AMBULATORY_CARE_PROVIDER_SITE_OTHER): Payer: Self-pay | Admitting: Physician Assistant

## 2019-11-26 DIAGNOSIS — E038 Other specified hypothyroidism: Secondary | ICD-10-CM

## 2019-12-01 ENCOUNTER — Other Ambulatory Visit: Payer: Self-pay | Admitting: General Surgery

## 2019-12-01 ENCOUNTER — Other Ambulatory Visit (HOSPITAL_COMMUNITY): Payer: Self-pay | Admitting: General Surgery

## 2019-12-02 ENCOUNTER — Other Ambulatory Visit: Payer: Self-pay | Admitting: Registered Nurse

## 2019-12-02 DIAGNOSIS — F3289 Other specified depressive episodes: Secondary | ICD-10-CM

## 2019-12-02 MED ORDER — BUPROPION HCL ER (SR) 150 MG PO TB12: 150.0000 mg | ORAL_TABLET | Freq: Two times a day (BID) | ORAL | 0 refills | Status: DC

## 2019-12-02 NOTE — Telephone Encounter (Signed)
Medication Refill - Medication: Bupropion   Has the patient contacted their pharmacy? Yes.   Pharmacy is asking for a high priority medication request. Please advise.  (Agent: If no, request that the patient contact the pharmacy for the refill.) (Agent: If yes, when and what did the pharmacy advise?)  Preferred Pharmacy (with phone number or street name):  [facility], [place]  [address] [place] [address]  Phone: [phone] Fax: [phone]  Hours: Open 24 hours     Agent: Please be advised that RX refills may take up to 3 business days. We ask that you follow-up with your pharmacy.

## 2019-12-02 NOTE — Telephone Encounter (Signed)
Requested Prescriptions  Pending Prescriptions Disp Refills  . buPROPion (WELLBUTRIN SR) 150 MG 12 hr tablet 180 tablet 1    Sig: Take 1 tablet (150 mg total) by mouth 2 (two) times daily.     Psychiatry: Antidepressants - bupropion Passed - 12/02/2019  3:12 PM      Passed - Completed PHQ-2 or PHQ-9 in the last 360 days.      Passed - Last BP in normal range    BP Readings from Last 1 Encounters:  11/19/19 (!) 94/50         Passed - Valid encounter within last 6 months    Recent Outpatient Visits          3 weeks ago Annual physical exam   [facility], NP   6 months ago Cough   [facility], Delfino Lovett, NP   6 months ago COVID-19   [facility], Delfino Lovett, NP   6 months ago Shortness of breath   [facility], Delfino Lovett, NP   10 months ago Hematuria, unspecified type   [facility], Ranell Patrick, MD      Future Appointments            In 9 months Ward Givens, NP [facility]

## 2019-12-17 ENCOUNTER — Ambulatory Visit (INDEPENDENT_AMBULATORY_CARE_PROVIDER_SITE_OTHER): Payer: BC Managed Care – PPO | Admitting: Physician Assistant

## 2019-12-17 ENCOUNTER — Other Ambulatory Visit: Payer: Self-pay

## 2019-12-17 ENCOUNTER — Encounter (INDEPENDENT_AMBULATORY_CARE_PROVIDER_SITE_OTHER): Payer: Self-pay | Admitting: Physician Assistant

## 2019-12-17 VITALS — BP 100/66 | HR 62 | Temp 98.3°F | Ht 68.0 in | Wt 324.0 lb

## 2019-12-17 DIAGNOSIS — E559 Vitamin D deficiency, unspecified: Secondary | ICD-10-CM | POA: Diagnosis not present

## 2019-12-17 DIAGNOSIS — Z6841 Body Mass Index (BMI) 40.0 and over, adult: Secondary | ICD-10-CM

## 2019-12-17 DIAGNOSIS — Z9189 Other specified personal risk factors, not elsewhere classified: Secondary | ICD-10-CM | POA: Diagnosis not present

## 2019-12-17 DIAGNOSIS — R7303 Prediabetes: Secondary | ICD-10-CM

## 2019-12-17 MED ORDER — VITAMIN D (ERGOCALCIFEROL) 1.25 MG (50000 UNIT) PO CAPS: 50000.0000 [IU] | ORAL_CAPSULE | ORAL | 0 refills | Status: DC

## 2019-12-17 MED ORDER — SAXENDA 18 MG/3ML ~~LOC~~ SOPN: 3.0000 mg | PEN_INJECTOR | Freq: Every day | SUBCUTANEOUS | 0 refills | Status: DC

## 2019-12-17 NOTE — Progress Notes (Signed)
Chief Complaint:   OBESITY Melanie [name] is here to discuss her progress with her obesity treatment plan along with follow-up of her obesity related diagnoses. Melanie [name] is practicing portion control and making smarter food choices, such as increasing vegetables and decreasing simple carbohydrates and states she is following her eating plan approximately 40% of the time. Melanie [name] states she is exercising 0 minutes 0 times per week.  Today's visit was #: 37 Starting weight: 338 lbs Starting date: 05/23/2017 Today's weight: 324 lbs Today's date: 12/17/2019 Total lbs lost to date: 14 Total lbs lost since last in-office visit: 0  Interim History: Melanie [name] recently saw the bariatric surgeon and she has a psych evaluation coming up as well as an appointment with the dietician from the bariatric practice. She eats more during breakfast, less at lunch, and then a small dinner. Over the last month, she did not eat much during the day due to stressors.  Subjective:   Vitamin D deficiency. Melanie [name] is on prescription Vitamin D twice weekly. Last level was not at goal.   Ref. Range 04/16/2019 09:41  Vitamin D, 25-Hydroxy Latest Ref Range: 30.0 - 100.0 ng/mL 43.7   Prediabetes. Melanie [name] has a diagnosis of prediabetes based on her elevated HgA1c and was informed this puts her at greater risk of developing diabetes. She continues to work on diet and exercise to decrease her risk of diabetes. She denies nausea or hypoglycemia. Melanie [name] is on metformin and reports she is not excessively hungry between meals.  Lab Results  Component Value Date   HGBA1C 5.8 (H) 09/19/2019   Lab Results  Component Value Date   INSULIN 72.8 (H) 04/16/2019   INSULIN 46.3 (H) 12/24/2018   INSULIN 39.2 (H) 07/25/2018   INSULIN 30.4 (H) 05/01/2018   INSULIN 39.0 (H) 12/13/2017   At risk for diabetes mellitus. Melanie [name] is at higher than average risk for developing diabetes due to her obesity.   Assessment/Plan:   Vitamin D deficiency. Low  Vitamin D level contributes to fatigue and are associated with obesity, breast, and colon cancer. She was given a refill on her Vitamin D, Ergocalciferol, (DRISDOL) 1.25 MG (50000 UNIT) CAPS capsule 2 times a week #10 with 0 refills and will follow-up for routine testing of Vitamin D, at least 2-3 times per year to avoid over-replacement.   Prediabetes. [name] will continue to work on weight loss, exercise, and decreasing simple carbohydrates to help decrease the risk of diabetes. She will continue her medication as directed.   At risk for diabetes mellitus. Melanie [name] was given approximately 15 minutes of diabetes education and counseling today. We discussed intensive lifestyle modifications today with an emphasis on weight loss as well as increasing exercise and decreasing simple carbohydrates in her diet. We also reviewed medication options with an emphasis on risk versus benefit of those discussed.   Repetitive spaced learning was employed today to elicit superior memory formation and behavioral change.  Class 3 severe obesity with serious comorbidity and body mass index (BMI) of 45.0 to 49.9 in adult, unspecified obesity type (Melanie [name]). Melanie [name] has not yet taken Saxenda. Prescription was given for Melanie -Weight Management (SAXENDA) 18 MG/3ML SOPN 0.6 mg QAM #5 pens.  Melanie [name] is currently in the action stage of change. As such, her goal is to continue with weight loss efforts. She has agreed to the Category 2 Plan + 200 calories.   Exercise goals: For substantial health benefits, adults should do at least 150 minutes (2 hours and 30  minutes) a week of moderate-intensity, or 75 minutes (1 hour and 15 minutes) a week of vigorous-intensity aerobic physical activity, or an equivalent combination of moderate- and vigorous-intensity aerobic activity. Aerobic activity should be performed in episodes of at least 10 minutes, and preferably, it should be spread throughout the week.  Behavioral modification strategies:  increasing lean protein intake and meal planning and cooking strategies.  Melanie [name] has agreed to follow-up with our clinic in 3 weeks. She was informed of the importance of frequent follow-up visits to maximize her success with intensive lifestyle modifications for her multiple health conditions.   Objective:   Blood pressure 100/66, pulse 62, temperature 98.3 F (36.8 C), height 5' 8"  (1.727 m), weight (!) 324 lb (147 kg), SpO2 98 %. Body mass index is 49.26 kg/m.  General: Cooperative, alert, well developed, in no acute distress. HEENT: Conjunctivae and lids unremarkable. Cardiovascular: Regular rhythm.  Lungs: Normal work of breathing. Neurologic: No focal deficits.   Lab Results  Component Value Date   CREATININE 1.17 (H) 09/19/2019   BUN 24 09/19/2019   NA 139 09/19/2019   K 3.5 09/19/2019   CL 98 09/19/2019   CO2 27 09/19/2019   Lab Results  Component Value Date   ALT 79 (H) 09/19/2019   AST 61 (H) 09/19/2019   ALKPHOS 152 (H) 09/19/2019   BILITOT 0.9 09/19/2019   Lab Results  Component Value Date   HGBA1C 5.8 (H) 09/19/2019   HGBA1C 5.6 04/16/2019   HGBA1C 5.6 12/24/2018   HGBA1C 5.5 07/25/2018   HGBA1C 5.5 05/01/2018   Lab Results  Component Value Date   INSULIN 72.8 (H) 04/16/2019   INSULIN 46.3 (H) 12/24/2018   INSULIN 39.2 (H) 07/25/2018   INSULIN 30.4 (H) 05/01/2018   INSULIN 39.0 (H) 12/13/2017   Lab Results  Component Value Date   TSH 0.580 09/19/2019   Lab Results  Component Value Date   CHOL 220 (H) 09/19/2019   HDL 59 09/19/2019   LDLCALC 128 (H) 09/19/2019   TRIG 187 (H) 09/19/2019   CHOLHDL 3.7 09/19/2019   Lab Results  Component Value Date   WBC 6.2 09/19/2019   HGB 13.5 09/19/2019   HCT 38.9 09/19/2019   MCV 92 09/19/2019   PLT 185 09/19/2019   Lab Results  Component Value Date   IRON 95 06/27/2013   TIBC 371 06/27/2013   FERRITIN 401 (H) 06/27/2013   Attestation Statements:   Reviewed by clinician on day of visit:  allergies, medications, problem list, medical history, surgical history, family history, social history, and previous encounter notes.  Melanie [name], am acting as transcriptionist for Melanie Potash, PA-C   I have reviewed the above documentation for accuracy and completeness, and I agree with the above. Melanie Potash, PA-C

## 2019-12-19 ENCOUNTER — Other Ambulatory Visit: Payer: Self-pay | Admitting: Neurology

## 2019-12-20 ENCOUNTER — Other Ambulatory Visit (INDEPENDENT_AMBULATORY_CARE_PROVIDER_SITE_OTHER): Payer: Self-pay | Admitting: Physician Assistant

## 2019-12-20 DIAGNOSIS — K219 Gastro-esophageal reflux disease without esophagitis: Secondary | ICD-10-CM

## 2019-12-25 ENCOUNTER — Encounter (INDEPENDENT_AMBULATORY_CARE_PROVIDER_SITE_OTHER): Payer: Self-pay | Admitting: Physician Assistant

## 2019-12-27 ENCOUNTER — Other Ambulatory Visit (INDEPENDENT_AMBULATORY_CARE_PROVIDER_SITE_OTHER): Payer: Self-pay | Admitting: Physician Assistant

## 2019-12-29 ENCOUNTER — Encounter (INDEPENDENT_AMBULATORY_CARE_PROVIDER_SITE_OTHER): Payer: Self-pay

## 2019-12-29 IMAGING — DX DG FOOT 2V*L*
2 series · 2 of 2 positions shown · non-contrast
Comparison: None.

CLINICAL DATA: Foot pain, no known injury

EXAM:
LEFT FOOT - 2 VIEW

[foot ap]
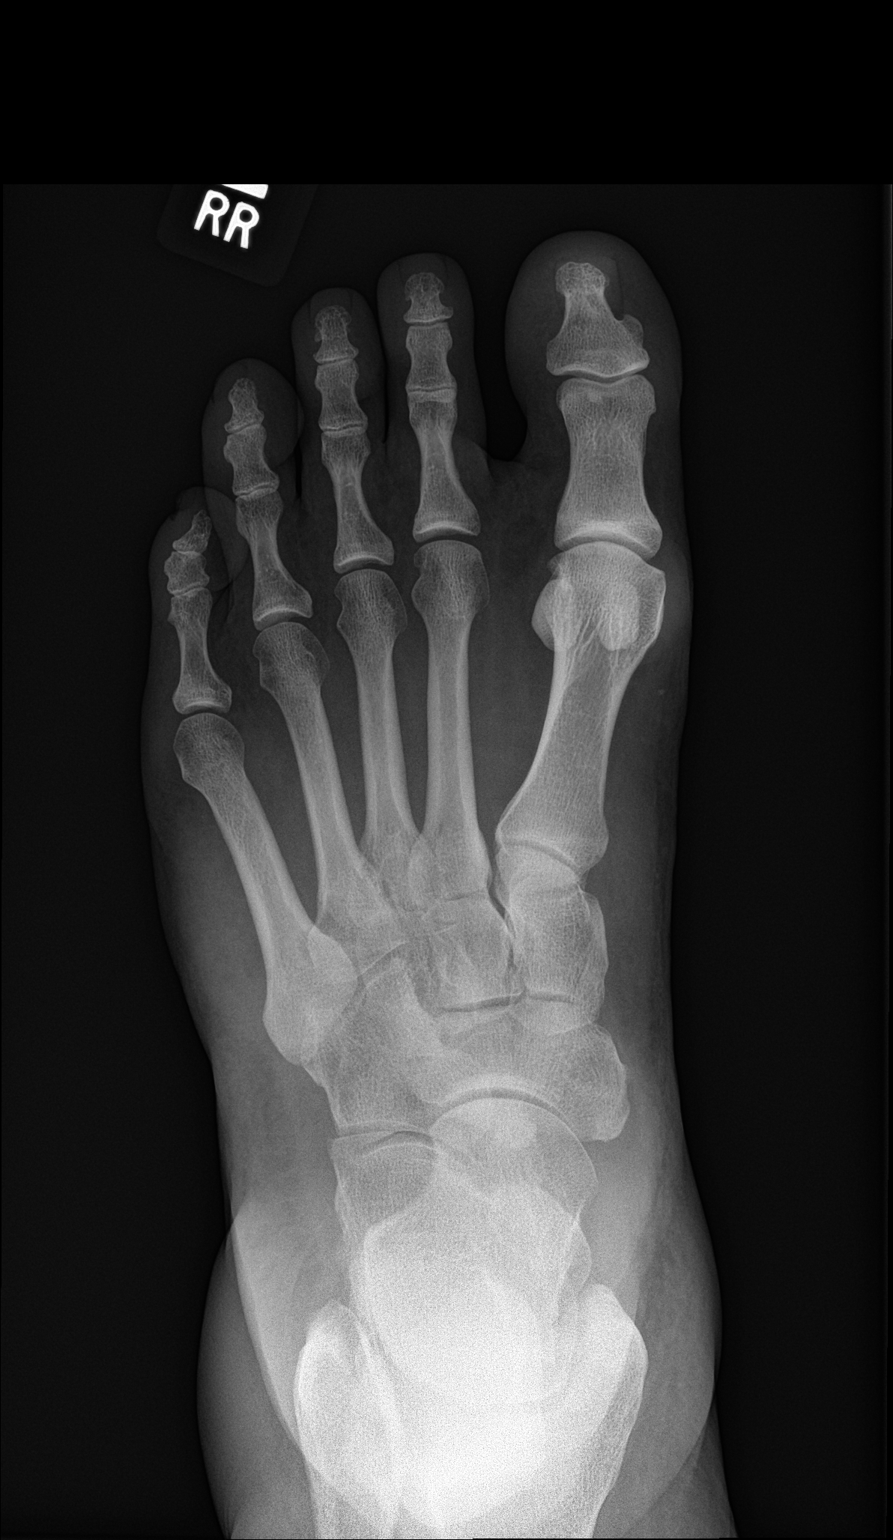

[foot lat]
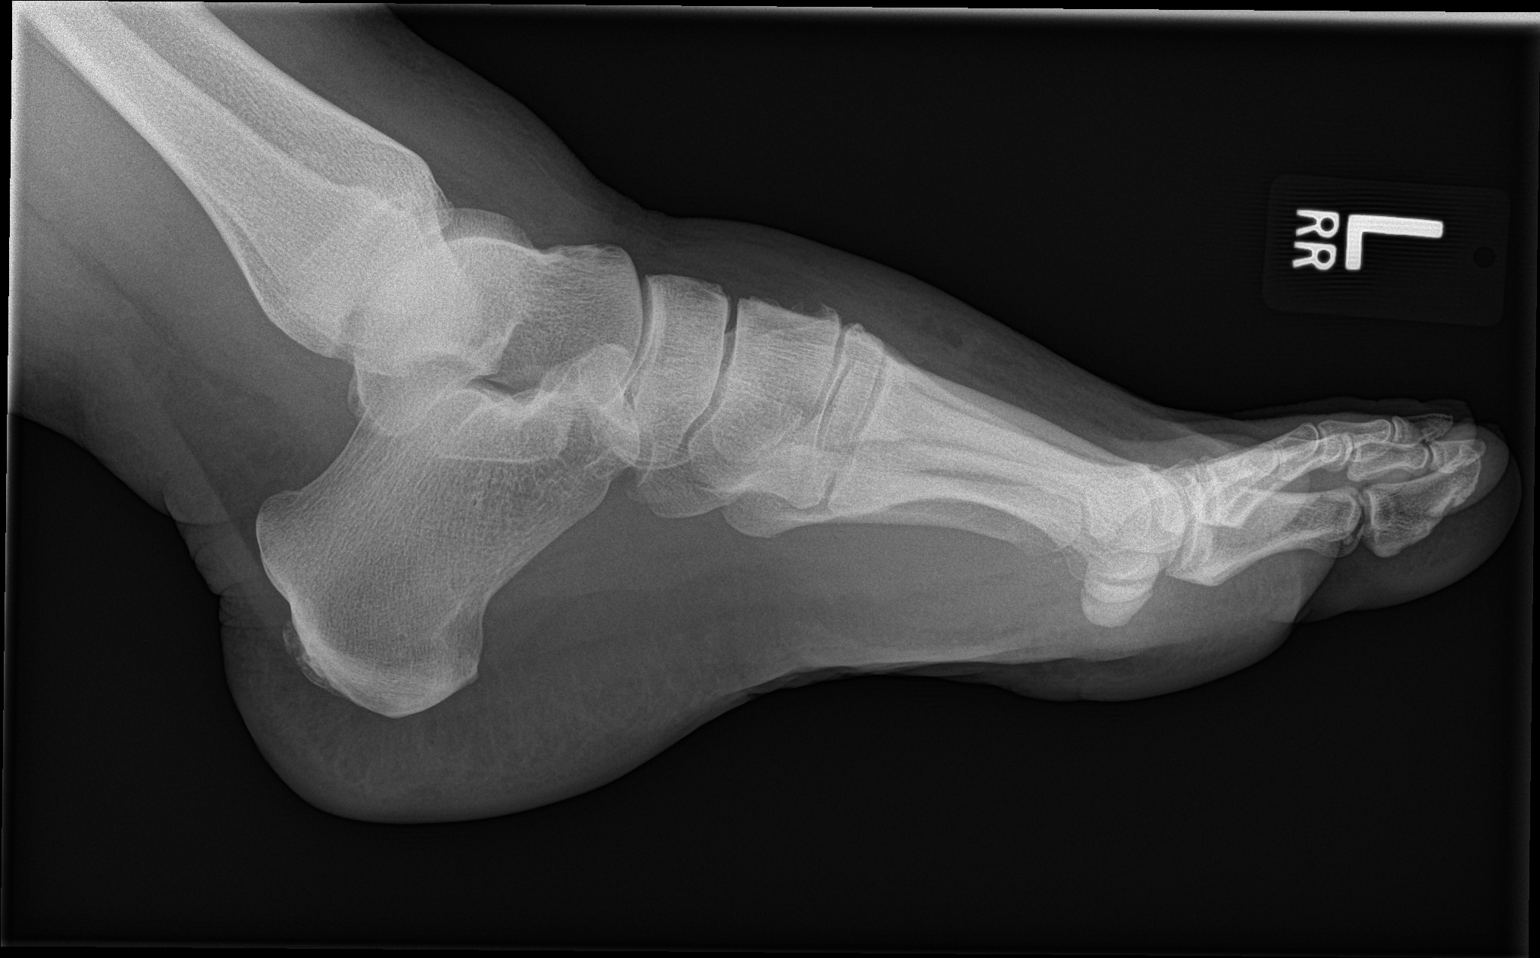

[2 of 2 positions shown; findings below may reference images not displayed]

FINDINGS: There is no evidence of fracture or dislocation. There is no erosive
change. There is no periosteal reaction or bone destruction. There
is mild osteoarthritis of the first TMT joint. There is soft tissue
swelling over the midfoot.
IMPRESSION: No acute osseous injury of the left foot.

## 2019-12-29 IMAGING — DX DG FOOT 2V*R*
2 series · 2 of 2 positions shown · non-contrast
Comparison: None.

CLINICAL DATA: Foot pain, no known injury

EXAM:
RIGHT FOOT - 2 VIEW

[foot ap]
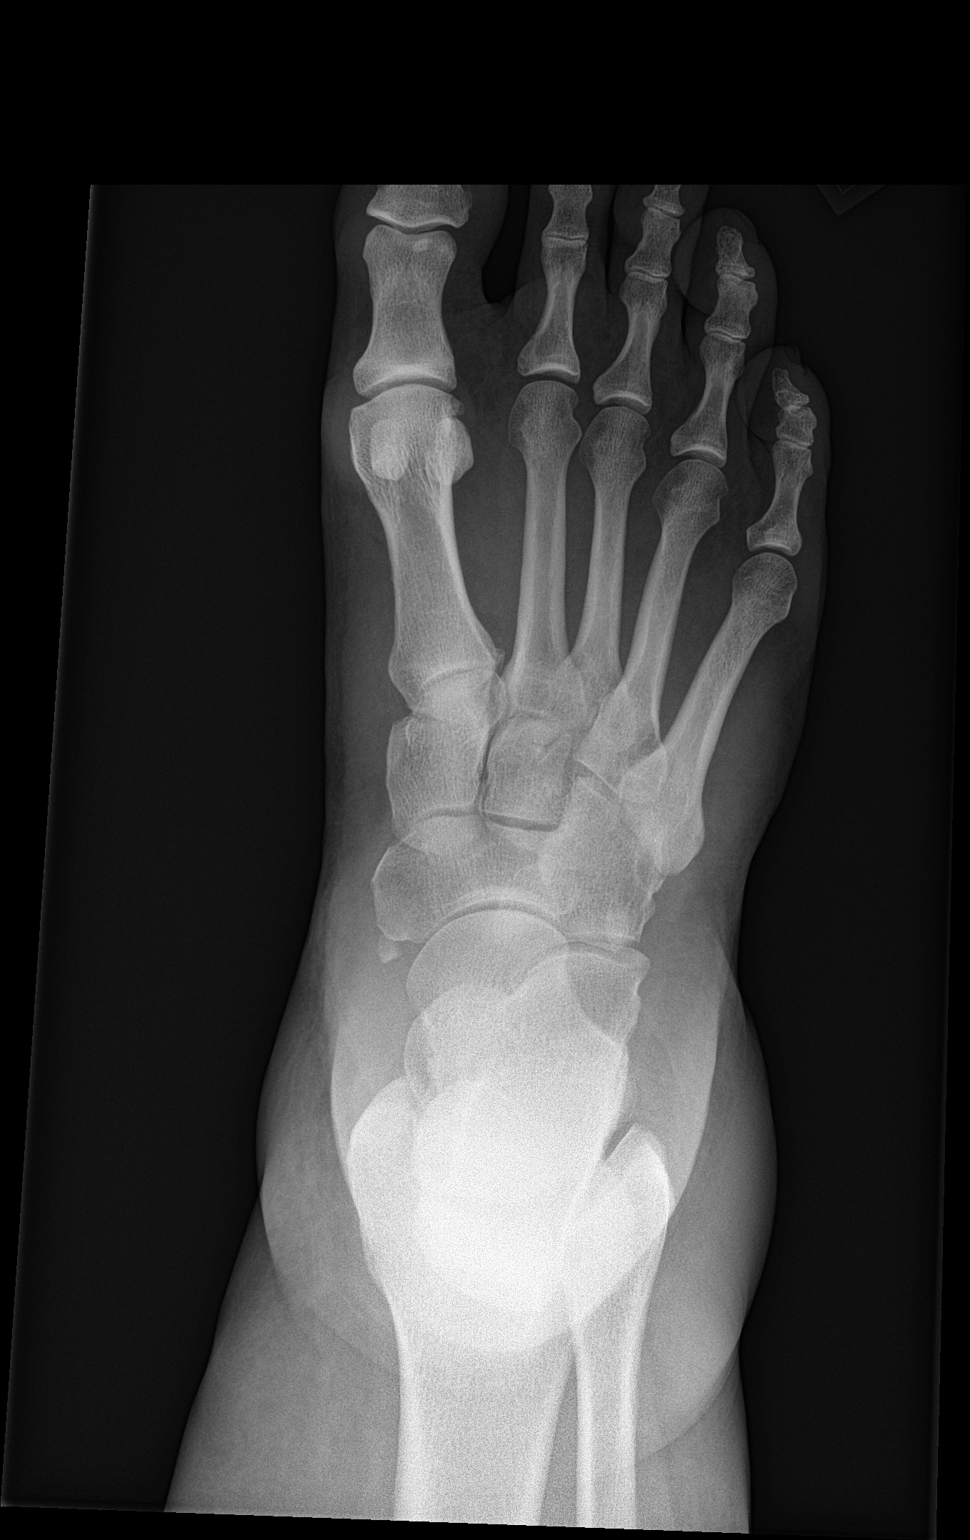

[foot lat]
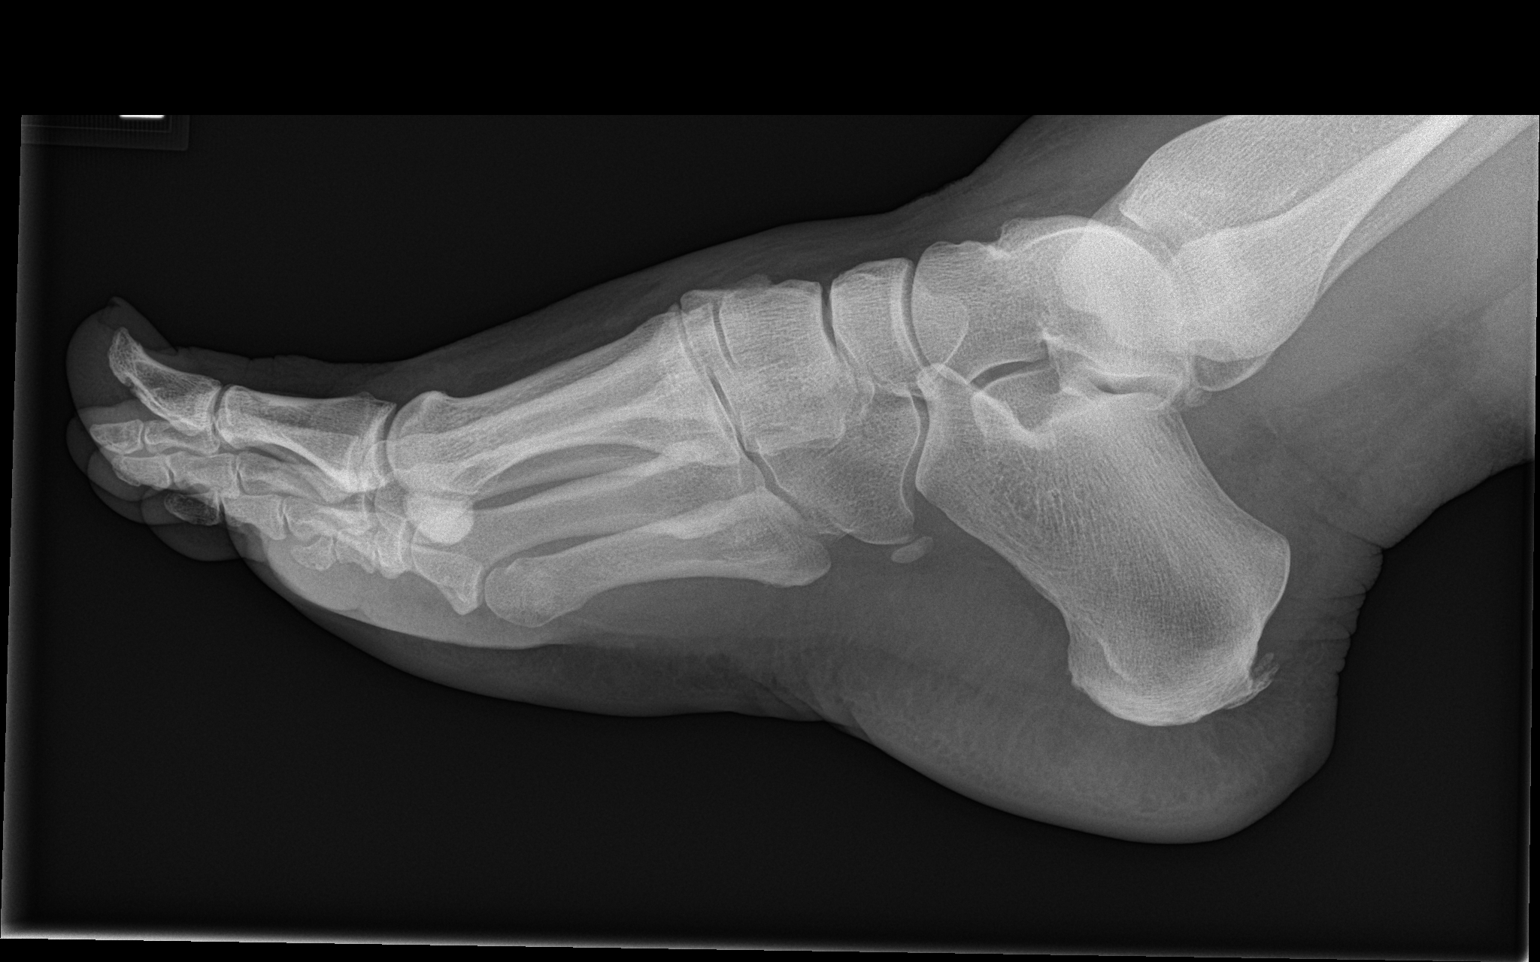

[2 of 2 positions shown; findings below may reference images not displayed]

FINDINGS: There is no evidence of fracture or dislocation. There is no erosive
change. There is no periosteal reaction or bone destruction. There
is mild osteoarthritis of the first TMT joint. There is mild
osteoarthritis of the first MTP joint. There is soft tissue swelling
over the midfoot.
IMPRESSION: No acute osseous injury of the left foot.

## 2019-12-30 ENCOUNTER — Encounter: Payer: Self-pay | Admitting: Skilled Nursing Facility1

## 2019-12-30 ENCOUNTER — Encounter: Payer: BC Managed Care – PPO | Attending: General Surgery | Admitting: Skilled Nursing Facility1

## 2019-12-30 ENCOUNTER — Other Ambulatory Visit: Payer: Self-pay

## 2019-12-30 DIAGNOSIS — Z6841 Body Mass Index (BMI) 40.0 and over, adult: Secondary | ICD-10-CM

## 2019-12-30 NOTE — Progress Notes (Signed)
Nutrition Assessment for Bariatric Surgery Medical Nutrition Therapy  Patient was seen on 12/30/2019 for Pre-Operative Nutrition Assessment. Letter of approval faxed to [facility] bariatric surgery program coordinator on 12/30/2019  Referral stated Supervised Weight Loss (SWL) visits needed: 0  Planned surgery: sleeve gastrectomy  Pt expectation of surgery: to lose weight Pt expectation of dietitian: none identified   Dietitian Clearance: per pts current habits and behaviors pt seems appropriately prepared for surgery    NUTRITION ASSESSMENT   Anthropometrics  Start weight at [facility]: 324 lbs (date: 12/30/2019)  Height: 68 in BMI: 49.26 kg/m2     Clinical  Medical hx: prediabetes  Medications: see list Labs: A1C 5.8 Notable signs/symptoms: migraines, neck pain Any previous deficiencies? 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   24-Hr Dietary Recall First Meal: nuts + yogurt + wrap Snack:  Second Meal: lunch meat + carrots + tomatoes  Snack: cheese stick Third Meal: chicken tacos Snack: seconds Beverages: water, diet soda,    Estimated Energy Needs Calories: 1800   NUTRITION DIAGNOSIS  Overweight/obesity ([place]-3.3) related to past poor dietary habits and physical inactivity as evidenced by patient w/ planned sleeve gastrectomy surgery following dietary guidelines for continued weight loss.    NUTRITION INTERVENTION  Nutrition counseling (C-1) and education (E-2) to facilitate bariatric surgery goals.   Pre-Op Goals Reviewed with the Patient . Track food and beverage intake (pen and paper, MyFitness Pal, Baritastic app, etc.) . Make healthy food choices while monitoring portion  sizes . Consume 3 meals per day or try to eat every 3-5 hours . Avoid concentrated sugars and fried foods . Keep sugar & fat in the single digits per serving on food labels . Practice CHEWING your food (aim for applesauce consistency) . Practice not drinking 15 minutes before, during, and 30 minutes after each meal and snack . Avoid all carbonated beverages (ex: soda, sparkling beverages)  . Limit caffeinated beverages (ex: coffee, tea, energy drinks) . Avoid all sugar-sweetened beverages (ex: regular soda, sports drinks)  . Avoid alcohol  . Aim for 64-100 ounces of FLUID daily (with at least half of fluid intake being plain water)  . Aim for at least 60-80 grams of PROTEIN daily . Look for a liquid protein source that contains ?15 g protein and ?5 g carbohydrate (ex: shakes, drinks, shots) . Make a list of non-food related activities . Physical activity is an important part of a healthy lifestyle so keep it moving! The goal is to reach 150 minutes of exercise per week, including cardiovascular and weight baring activity.  *Goals that are bolded indicate the pt would like to start working towards these  Handouts Provided Include  . Bariatric Surgery handouts (Nutrition Visits, Pre-Op Goals, Protein Shakes, Vitamins & Minerals)  Learning Style & Readiness for Change Teaching method utilized: Visual & Auditory  Demonstrated degree of understanding via: Teach Back  . Barriers to learning/adherence to lifestyle change: none identified      MONITORING & EVALUATION Dietary intake, weekly physical activity, body weight, and pre-op goals reached at next nutrition visit.    Next Steps  Patient is to follow up at [facility] for Pre-Op Class >2 weeks before surgery for further nutrition education.

## 2020-01-12 ENCOUNTER — Ambulatory Visit (INDEPENDENT_AMBULATORY_CARE_PROVIDER_SITE_OTHER): Payer: BC Managed Care – PPO | Admitting: Physician Assistant

## 2020-01-12 ENCOUNTER — Encounter (INDEPENDENT_AMBULATORY_CARE_PROVIDER_SITE_OTHER): Payer: Self-pay | Admitting: Physician Assistant

## 2020-01-12 ENCOUNTER — Other Ambulatory Visit: Payer: Self-pay

## 2020-01-12 VITALS — BP 107/66 | HR 66 | Temp 98.1°F | Ht 68.0 in | Wt 320.0 lb

## 2020-01-12 DIAGNOSIS — E7849 Other hyperlipidemia: Secondary | ICD-10-CM

## 2020-01-12 DIAGNOSIS — E559 Vitamin D deficiency, unspecified: Secondary | ICD-10-CM | POA: Diagnosis not present

## 2020-01-12 DIAGNOSIS — R7303 Prediabetes: Secondary | ICD-10-CM | POA: Diagnosis not present

## 2020-01-12 DIAGNOSIS — Z9189 Other specified personal risk factors, not elsewhere classified: Secondary | ICD-10-CM | POA: Diagnosis not present

## 2020-01-12 DIAGNOSIS — Z6841 Body Mass Index (BMI) 40.0 and over, adult: Secondary | ICD-10-CM

## 2020-01-12 NOTE — Progress Notes (Signed)
Chief Complaint:   OBESITY Melanie [name] is here to discuss her progress with her obesity treatment plan along with follow-up of her obesity related diagnoses. Melanie [name] is on the Category 2 Plan + 200 calories and states she is following her eating plan approximately 80% of the time. Melanie [name] states she is exercising 0 minutes 0 times per week.  Today's visit was #: 68 Starting weight: 338 lbs Starting date: 05/23/2017 Today's weight: 320 lbs Today's date: 01/12/2020 Total lbs lost to date: 18 Total lbs lost since last in-office visit: 4  Interim History: Bianney started Saxenda and is now on 1.2 mg. She reports her sugar cravings have decreased and also states her general cravings are more under control. She is having appropriate hunger signals. She met with the bariatric dietician recently who made no changes to her plan. She will be meeting with her again soon.   Subjective:   Other hyperlipidemia. Melanie [name] is on pravastatin and denies chest pain or myalgias.   Lab Results  Component Value Date   CHOL 220 (H) 09/19/2019   HDL 59 09/19/2019   LDLCALC 128 (H) 09/19/2019   TRIG 187 (H) 09/19/2019   CHOLHDL 3.7 09/19/2019   Lab Results  Component Value Date   ALT 79 (H) 09/19/2019   AST 61 (H) 09/19/2019   ALKPHOS 152 (H) 09/19/2019   BILITOT 0.9 09/19/2019   The 10-year ASCVD risk score Mikey Bussing DC Jr., et al., 2013) is: 1.6%   Values used to calculate the score:     Age: 44 years     Sex: Female     Is Non-Hispanic African American: No     Diabetic: Yes     Tobacco smoker: No     Systolic Blood Pressure: 034 mmHg     Is BP treated: Yes     HDL Cholesterol: 59 mg/dL     Total Cholesterol: 220 mg/dL  Prediabetes. Melanie [name] has a diagnosis of prediabetes based on her elevated HgA1c and was informed this puts her at greater risk of developing diabetes. She continues to work on diet and exercise to decrease her risk of diabetes. She denies nausea or hypoglycemia. Melanie [name] stopped her metformin  after starting Saxenda. She is due for labs.  Lab Results  Component Value Date   HGBA1C 5.8 (H) 09/19/2019   Lab Results  Component Value Date   INSULIN 72.8 (H) 04/16/2019   INSULIN 46.3 (H) 12/24/2018   INSULIN 39.2 (H) 07/25/2018   INSULIN 30.4 (H) 05/01/2018   INSULIN 39.0 (H) 12/13/2017   Vitamin D deficiency. Melanie [name] is on Vitamin D twice daily. She reports low energy. She is due for labs.   Ref. Range 04/16/2019 09:41  Vitamin D, 25-Hydroxy Latest Ref Range: 30.0 - 100.0 ng/mL 43.7   At risk for heart disease. Melanie [name] is at a higher than average risk for cardiovascular disease due to obesity.   Assessment/Plan:   Other hyperlipidemia.  Cardiovascular risk and specific lipid/LDL goals reviewed.  We discussed several lifestyle modifications today and Melanie [name] will continue to work on diet, exercise and weight loss efforts. Orders and follow up as documented in patient record. Labs will be checked today.   Counseling Intensive lifestyle modifications are the first line treatment for this issue. . Dietary changes: Increase soluble fiber. Decrease simple carbohydrates. . Exercise changes: Moderate to vigorous-intensity aerobic activity 150 minutes per week if tolerated. . Lipid-lowering medications: see documented in medical record.  Prediabetes.  Melanie [name] will continue to work  on weight loss, exercise, and decreasing simple carbohydrates to help decrease the risk of diabetes. Labs will be checked today.   Vitamin D deficiency. Low Vitamin D level contributes to fatigue and are associated with obesity, breast, and colon cancer. VITAMIN D 25 Hydroxy (Vit-D Deficiency, Fractures) level will be checked today.   At risk for heart disease. Melanie [name] was given approximately 15 minutes of coronary artery disease prevention counseling today. She is 44 y.o. female and has risk factors for heart disease including obesity. We discussed intensive lifestyle modifications today with an emphasis on specific weight  loss instructions and strategies.   Repetitive spaced learning was employed today to elicit superior memory formation and behavioral change.  Class 3 severe obesity with serious comorbidity and body mass index (BMI) of 45.0 to 49.9 in adult, unspecified obesity type (HCC).  Melanie [name] is currently in the action stage of change. As such, her goal is to continue with weight loss efforts. She has agreed to the Category 2 Plan + 200 calories.   Exercise goals: For substantial health benefits, adults should do at least 150 minutes (2 hours and 30 minutes) a week of moderate-intensity, or 75 minutes (1 hour and 15 minutes) a week of vigorous-intensity aerobic physical activity, or an equivalent combination of moderate- and vigorous-intensity aerobic activity. Aerobic activity should be performed in episodes of at least 10 minutes, and preferably, it should be spread throughout the week.  Behavioral modification strategies: meal planning and cooking strategies and keeping healthy foods in the home.  Melanie [name] has agreed to follow-up with our clinic in 3 weeks. She was informed of the importance of frequent follow-up visits to maximize her success with intensive lifestyle modifications for her multiple health conditions.   Melanie [name] was informed we would discuss her lab results at her next visit unless there is a critical issue that needs to be addressed sooner. Melanie [name] agreed to keep her next visit at the agreed upon time to discuss these results.  Objective:   Blood pressure 107/66, pulse 66, temperature 98.1 F (36.7 C), height 5' 8" (1.727 m), weight (!) 320 lb (145.2 kg), SpO2 98 %. Body mass index is 48.66 kg/m.  General: Cooperative, alert, well developed, in no acute distress. HEENT: Conjunctivae and lids unremarkable. Cardiovascular: Regular rhythm.  Lungs: Normal work of breathing. Neurologic: No focal deficits.   Lab Results  Component Value Date   CREATININE 1.17 (H) 09/19/2019   BUN 24 09/19/2019     NA 139 09/19/2019   K 3.5 09/19/2019   CL 98 09/19/2019   CO2 27 09/19/2019   Lab Results  Component Value Date   ALT 79 (H) 09/19/2019   AST 61 (H) 09/19/2019   ALKPHOS 152 (H) 09/19/2019   BILITOT 0.9 09/19/2019   Lab Results  Component Value Date   HGBA1C 5.8 (H) 09/19/2019   HGBA1C 5.6 04/16/2019   HGBA1C 5.6 12/24/2018   HGBA1C 5.5 07/25/2018   HGBA1C 5.5 05/01/2018   Lab Results  Component Value Date   INSULIN 72.8 (H) 04/16/2019   INSULIN 46.3 (H) 12/24/2018   INSULIN 39.2 (H) 07/25/2018   INSULIN 30.4 (H) 05/01/2018   INSULIN 39.0 (H) 12/13/2017   Lab Results  Component Value Date   TSH 0.580 09/19/2019   Lab Results  Component Value Date   CHOL 220 (H) 09/19/2019   HDL 59 09/19/2019   LDLCALC 128 (H) 09/19/2019   TRIG 187 (H) 09/19/2019   CHOLHDL 3.7 09/19/2019   Lab Results  Component   Value Date   WBC 6.2 09/19/2019   HGB 13.5 09/19/2019   HCT 38.9 09/19/2019   MCV 92 09/19/2019   PLT 185 09/19/2019   Lab Results  Component Value Date   IRON 95 06/27/2013   TIBC 371 06/27/2013   FERRITIN 401 (H) 06/27/2013   Attestation Statements:   Reviewed by clinician on day of visit: allergies, medications, problem list, medical history, surgical history, family history, social history, and previous encounter notes.  I, Denise Haag, am acting as transcriptionist for Tracey [name], PA-C   I have reviewed the above documentation for accuracy and completeness, and I agree with the above. -  Tracey [name], PA-C  

## 2020-01-13 LAB — COMPREHENSIVE METABOLIC PANEL
ALT: 81 IU/L — ABNORMAL HIGH (ref 0–32)
AST: 51 IU/L — ABNORMAL HIGH (ref 0–40)
Albumin/Globulin Ratio: 1.8 (ref 1.2–2.2)
Albumin: 4.6 g/dL (ref 3.8–4.8)
Alkaline Phosphatase: 171 IU/L — ABNORMAL HIGH (ref 44–121)
BUN/Creatinine Ratio: 12 (ref 9–23)
BUN: 16 mg/dL (ref 6–24)
Bilirubin Total: 0.7 mg/dL (ref 0.0–1.2)
CO2: 26 mmol/L (ref 20–29)
Calcium: 11 mg/dL — ABNORMAL HIGH (ref 8.7–10.2)
Chloride: 100 mmol/L (ref 96–106)
Creatinine, Ser: 1.34 mg/dL — ABNORMAL HIGH (ref 0.57–1.00)
GFR calc Af Amer: 55 mL/min/{1.73_m2} — ABNORMAL LOW (ref >59)
GFR calc non Af Amer: 48 mL/min/{1.73_m2} — ABNORMAL LOW (ref >59)
Globulin, Total: 2.5 g/dL (ref 1.5–4.5)
Glucose: 150 mg/dL — ABNORMAL HIGH (ref 65–99)
Potassium: 3.3 mmol/L — ABNORMAL LOW (ref 3.5–5.2)
Sodium: 141 mmol/L (ref 134–144)
Total Protein: 7.1 g/dL (ref 6.0–8.5)

## 2020-01-13 LAB — HEMOGLOBIN A1C
Est. average glucose Bld gHb Est-mCnc: 126 mg/dL
Hgb A1c MFr Bld: 6 % — ABNORMAL HIGH (ref 4.8–5.6)

## 2020-01-13 LAB — LIPID PANEL
Chol/HDL Ratio: 4.5 ratio — ABNORMAL HIGH (ref 0.0–4.4)
Cholesterol, Total: 186 mg/dL (ref 100–199)
HDL: 41 mg/dL (ref >39)
LDL Chol Calc (NIH): 103 mg/dL — ABNORMAL HIGH (ref 0–99)
Triglycerides: 245 mg/dL — ABNORMAL HIGH (ref 0–149)
VLDL Cholesterol Cal: 42 mg/dL — ABNORMAL HIGH (ref 5–40)

## 2020-01-13 LAB — VITAMIN D 25 HYDROXY (VIT D DEFICIENCY, FRACTURES): Vit D, 25-Hydroxy: 64.9 ng/mL (ref 30.0–100.0)

## 2020-01-13 LAB — INSULIN, RANDOM: INSULIN: 82.8 u[IU]/mL — ABNORMAL HIGH (ref 2.6–24.9)

## 2020-01-18 ENCOUNTER — Other Ambulatory Visit: Payer: Self-pay | Admitting: Registered Nurse

## 2020-01-18 DIAGNOSIS — F3289 Other specified depressive episodes: Secondary | ICD-10-CM

## 2020-01-20 ENCOUNTER — Encounter: Payer: Self-pay | Admitting: Nurse Practitioner

## 2020-01-20 ENCOUNTER — Other Ambulatory Visit: Payer: Self-pay

## 2020-01-20 ENCOUNTER — Ambulatory Visit: Payer: BC Managed Care – PPO | Admitting: Nurse Practitioner

## 2020-01-20 VITALS — BP 120/74 | Ht 68.0 in | Wt 324.0 lb

## 2020-01-20 DIAGNOSIS — N951 Menopausal and female climacteric states: Secondary | ICD-10-CM | POA: Diagnosis not present

## 2020-01-20 DIAGNOSIS — Z6841 Body Mass Index (BMI) 40.0 and over, adult: Secondary | ICD-10-CM

## 2020-01-20 DIAGNOSIS — Z01419 Encounter for gynecological examination (general) (routine) without abnormal findings: Secondary | ICD-10-CM

## 2020-01-20 DIAGNOSIS — Z1151 Encounter for screening for human papillomavirus (HPV): Secondary | ICD-10-CM

## 2020-01-20 NOTE — Addendum Note (Signed)
Addended by: Nelva Nay on: 01/20/2020 09:47 AM   Modules accepted: Orders

## 2020-01-20 NOTE — Progress Notes (Signed)
Melanie [name] 1975-04-08 [id number]   History:  44 y.o. G0 +5 adopted presents to establish care. Irregular cycles for years. Has started having night sweats and hot flashes over the past year. Pap and mammogram years ago. Normal pap and mammogram history. History of vitamin D deficiency, nonalcoholic fatty liver, hypothyroidism, migraines, and infertility. Bariatric surgery planned for around February.   Gynecologic History Patient's last menstrual period was 09/11/2019. Period Duration (Days): 1 Period Pattern: (!) Irregular Menstrual Flow: Light Dysmenorrhea: None Contraception: none Last Pap: UNK Last mammogram: UNK  Past medical history, past surgical history, family history and social history were all reviewed and documented in the EPIC chart.  ROS:  A ROS was performed and pertinent positives and negatives are included.  Exam:  Vitals:   01/20/20 0905  BP: 120/74  Weight: (!) 324 lb (147 kg)  Height: 5\' 8"  (1.727 m)   Body mass index is 49.26 kg/m.  General appearance:  Normal Thyroid:  Symmetrical, normal in size, without palpable masses or nodularity. Respiratory  Auscultation:  Clear without wheezing or rhonchi Cardiovascular  Auscultation:  Regular rate, without rubs, murmurs or gallops  Edema/varicosities:  Not grossly evident Abdominal  Soft,nontender, without masses, guarding or rebound.  Liver/spleen:  No organomegaly noted  Hernia:  None appreciated  Skin  Inspection:  Grossly normal   Breasts: Examined lying and sitting.   Right: Without masses, retractions, discharge or axillary adenopathy.   Left: Without masses, retractions, discharge or axillary adenopathy. Gentitourinary   Inguinal/mons:  Normal without inguinal adenopathy  External genitalia:  Normal  BUS/Urethra/Skene's glands:  Normal  Vagina:  Normal  Cervix:  Normal  Uterus:  Difficult to palpate due to body habitus but no gross masses or tenderness  Adnexa/parametria:      Rt: Without masses or tenderness.   Lt: Without masses or tenderness.  Anus and perineum: Normal  Assessment/Plan:  44 y.o. G0 for annual exam.   Well female exam with routine gynecological exam - Education provided on SBEs, importance of preventative screenings, current guidelines, high calcium diet, regular exercise, and multivitamin daily. Labs with PCP.  Menopausal symptoms - Plan: Follicle stimulating hormone. Complains of hot flashes and night sweats worsening over the last year. History of irregular cycles.   BMI 45.0-49.9, adult (HCC) - in the early stages of planning for bariatric surgery with expected surgery date around February.   Screening for cervical cancer - Has not had pap smear in many years. Normal pap history. Pap with HR HPV today.   Screening for breast cancer - Has not had mammogram in many years. Normal mammogram history. Discussed current guidelines and importance of preventative screenings. Information provided on the breast center. Normal breast exam today.   Follow up in 1 year for annual.       [name], 9:29 AM 01/20/2020

## 2020-01-20 NOTE — Patient Instructions (Addendum)
Schedule mammogram! [facility] [phone] [address]  [place], [place] [address]  Health Maintenance, Female Adopting a healthy lifestyle and getting preventive care are important in promoting health and wellness. Ask your health care provider about:  The right schedule for you to have regular tests and exams.  Things you can do on your own to prevent diseases and keep yourself healthy. What should I know about diet, weight, and exercise? Eat a healthy diet   Eat a diet that includes plenty of vegetables, fruits, low-fat dairy products, and lean protein.  Do not eat a lot of foods that are high in solid fats, added sugars, or sodium. Maintain a healthy weight Body mass index (BMI) is used to identify weight problems. It estimates body fat based on height and weight. Your health care provider can help determine your BMI and help you achieve or maintain a healthy weight. Get regular exercise Get regular exercise. This is one of the most important things you can do for your health. Most adults should:  Exercise for at least 150 minutes each week. The exercise should increase your heart rate and make you sweat (moderate-intensity exercise).  Do strengthening exercises at least twice a week. This is in addition to the moderate-intensity exercise.  Spend less time sitting. Even light physical activity can be beneficial. Watch cholesterol and blood lipids Have your blood tested for lipids and cholesterol at 46 years of age, then have this test every 5 years. Have your cholesterol levels checked more often if:  Your lipid or cholesterol levels are high.  You are older than 46 years of age.  You are at high risk for heart disease. What should I know about cancer screening? Depending on your health history and family history, you may need to have cancer screening at various ages. This may include screening for:  Breast cancer.  Cervical  cancer.  Colorectal cancer.  Skin cancer.  Lung cancer. What should I know about heart disease, diabetes, and high blood pressure? Blood pressure and heart disease  High blood pressure causes heart disease and increases the risk of stroke. This is more likely to develop in people who have high blood pressure readings, are of African descent, or are overweight.  Have your blood pressure checked: ? Every 3-5 years if you are 18-39 years of age. ? Every year if you are 40 years old or older. Diabetes Have regular diabetes screenings. This checks your fasting blood sugar level. Have the screening done:  Once every three years after age 40 if you are at a normal weight and have a low risk for diabetes.  More often and at a younger age if you are overweight or have a high risk for diabetes. What should I know about preventing infection? Hepatitis B If you have a higher risk for hepatitis B, you should be screened for this virus. Talk with your health care provider to find out if you are at risk for hepatitis B infection. Hepatitis C Testing is recommended for:  Everyone born from 1945 through 1965.  Anyone with known risk factors for hepatitis C. Sexually transmitted infections (STIs)  Get screened for STIs, including gonorrhea and chlamydia, if: ? You are sexually active and are younger than 46 years of age. ? You are older than 46 years of age and your health care provider tells you that you are at risk for this type of infection. ? Your sexual activity has changed since you   were last screened, and you are at increased risk for chlamydia or gonorrhea. Ask your health care provider if you are at risk.  Ask your health care provider about whether you are at high risk for HIV. Your health care provider may recommend a prescription medicine to help prevent HIV infection. If you choose to take medicine to prevent HIV, you should first get tested for HIV. You should then be tested every 3  months for as long as you are taking the medicine. Pregnancy  If you are about to stop having your period (premenopausal) and you may become pregnant, seek counseling before you get pregnant.  Take 400 to 800 micrograms (mcg) of folic acid every day if you become pregnant.  Ask for birth control (contraception) if you want to prevent pregnancy. Osteoporosis and menopause Osteoporosis is a disease in which the bones lose minerals and strength with aging. This can result in bone fractures. If you are 65 years old or older, or if you are at risk for osteoporosis and fractures, ask your health care provider if you should:  Be screened for bone loss.  Take a calcium or vitamin D supplement to lower your risk of fractures.  Be given hormone replacement therapy (HRT) to treat symptoms of menopause. Follow these instructions at home: Lifestyle  Do not use any products that contain nicotine or tobacco, such as cigarettes, e-cigarettes, and chewing tobacco. If you need help quitting, ask your health care provider.  Do not use street drugs.  Do not share needles.  Ask your health care provider for help if you need support or information about quitting drugs. Alcohol use  Do not drink alcohol if: ? Your health care provider tells you not to drink. ? You are pregnant, may be pregnant, or are planning to become pregnant.  If you drink alcohol: ? Limit how much you use to 0-1 drink a day. ? Limit intake if you are breastfeeding.  Be aware of how much alcohol is in your drink. In the [place] one drink equals one 12 oz bottle of beer (355 mL), one 5 oz glass of wine (148 mL), or one 1 oz glass of hard liquor (44 mL). General instructions  Schedule regular health, dental, and eye exams.  Stay current with your vaccines.  Tell your health care provider if: ? You often feel depressed. ? You have ever been abused or do not feel safe at home. Summary  Adopting a healthy lifestyle and getting  preventive care are important in promoting health and wellness.  Follow your health care provider's instructions about healthy diet, exercising, and getting tested or screened for diseases.  Follow your health care provider's instructions on monitoring your cholesterol and blood pressure. This information is not intended to replace advice given to you by your health care provider. Make sure you discuss any questions you have with your health care provider. Document Revised: 02/20/2018 Document Reviewed: 02/20/2018 Elsevier Patient Education  2020 [employer]  

## 2020-01-21 ENCOUNTER — Other Ambulatory Visit (INDEPENDENT_AMBULATORY_CARE_PROVIDER_SITE_OTHER): Payer: Self-pay | Admitting: Physician Assistant

## 2020-01-21 DIAGNOSIS — Z6841 Body Mass Index (BMI) 40.0 and over, adult: Secondary | ICD-10-CM

## 2020-01-21 LAB — FOLLICLE STIMULATING HORMONE: FSH: 17.3 m[IU]/mL

## 2020-01-21 NOTE — Telephone Encounter (Signed)
Would you like me to refill her rx?

## 2020-01-21 NOTE — Telephone Encounter (Signed)
Pt last seen by Linus Orn.

## 2020-01-21 NOTE — Telephone Encounter (Signed)
Ok to send    thanks

## 2020-01-22 ENCOUNTER — Telehealth: Payer: Self-pay | Admitting: *Deleted

## 2020-01-22 ENCOUNTER — Other Ambulatory Visit (INDEPENDENT_AMBULATORY_CARE_PROVIDER_SITE_OTHER): Payer: Self-pay | Admitting: Physician Assistant

## 2020-01-22 DIAGNOSIS — E559 Vitamin D deficiency, unspecified: Secondary | ICD-10-CM

## 2020-01-22 LAB — PAP, TP IMAGING W/ HPV RNA, RFLX HPV TYPE 16,18/45: HPV DNA High Risk: NOT DETECTED

## 2020-01-22 NOTE — Telephone Encounter (Signed)
Schedule a mammogram

## 2020-01-26 NOTE — Telephone Encounter (Signed)
Pt saw Linus Orn last

## 2020-01-26 NOTE — Telephone Encounter (Signed)
This patient was last seen by Abby Potash, PA-C, and currently has an upcoming appt scheduled on 01/28/20 with her.

## 2020-01-28 ENCOUNTER — Encounter (INDEPENDENT_AMBULATORY_CARE_PROVIDER_SITE_OTHER): Payer: Self-pay

## 2020-01-28 NOTE — Telephone Encounter (Signed)
I will give her a refill when I see her in office. Thanks

## 2020-01-28 NOTE — Telephone Encounter (Signed)
Please advise about refill

## 2020-01-28 NOTE — Telephone Encounter (Signed)
Message sent to pt-CAS 

## 2020-02-02 ENCOUNTER — Ambulatory Visit (INDEPENDENT_AMBULATORY_CARE_PROVIDER_SITE_OTHER): Payer: BC Managed Care – PPO | Admitting: Physician Assistant

## 2020-02-02 ENCOUNTER — Other Ambulatory Visit: Payer: Self-pay

## 2020-02-02 ENCOUNTER — Encounter (INDEPENDENT_AMBULATORY_CARE_PROVIDER_SITE_OTHER): Payer: Self-pay | Admitting: Physician Assistant

## 2020-02-02 VITALS — BP 110/73 | HR 64 | Temp 98.4°F | Ht 68.0 in | Wt 314.0 lb

## 2020-02-02 DIAGNOSIS — E559 Vitamin D deficiency, unspecified: Secondary | ICD-10-CM

## 2020-02-02 DIAGNOSIS — Z6841 Body Mass Index (BMI) 40.0 and over, adult: Secondary | ICD-10-CM | POA: Diagnosis not present

## 2020-02-02 DIAGNOSIS — Z9189 Other specified personal risk factors, not elsewhere classified: Secondary | ICD-10-CM

## 2020-02-02 DIAGNOSIS — R7303 Prediabetes: Secondary | ICD-10-CM | POA: Diagnosis not present

## 2020-02-03 NOTE — Progress Notes (Signed)
Chief Complaint:   OBESITY Melanie [name] is here to discuss her progress with her obesity treatment plan along with follow-up of her obesity related diagnoses. Melanie [name] is on the Category 2 Plan and states she is following her eating plan approximately 85% of the time. Melanie [name] states she is exercising 0 minutes 0 times per week.  Today's visit was #: 27 Starting weight: 338 lbs Starting date: 05/23/2017 Today's weight: 314 lbs Today's date: 02/02/2020 Total lbs lost to date: 24 Total lbs lost since last in-office visit: 6  Interim History: Melanie [name] reports that the last few weeks have been very good. She keeps a lot of food and snacks at work, which help her stay on plan. She reports her cravings have decreased. She is on Saxenda 1.8 mg, which is helping with her appetite.  Subjective:   Vitamin D deficiency. Melanie [name] is on Vitamin D twice weekly. Last level was not at goal.   Ref. Range 01/12/2020 08:37  Vitamin D, 25-Hydroxy Latest Ref Range: 30.0 - 100.0 ng/mL 64.9   Prediabetes. Melanie [name] has a diagnosis of prediabetes based on her elevated HgA1c and was informed this puts her at greater risk of developing diabetes. She continues to work on diet and exercise to decrease her risk of diabetes. Melanie [name] is on metformin. No nausea, vomiting, or diarrhea.   Lab Results  Component Value Date   HGBA1C 6.0 (H) 01/12/2020   Lab Results  Component Value Date   INSULIN 82.8 (H) 01/12/2020   INSULIN 72.8 (H) 04/16/2019   INSULIN 46.3 (H) 12/24/2018   INSULIN 39.2 (H) 07/25/2018   INSULIN 30.4 (H) 05/01/2018   At risk for diabetes mellitus. Melanie [name] is at higher than average risk for developing diabetes due to obesity.   Assessment/Plan:   Vitamin D deficiency. Low Vitamin D level contributes to fatigue and are associated with obesity, breast, and colon cancer. She will change Vitamin D, Ergocalciferol, (DRISDOL) 1.25 MG (50000 UNIT) CAPS capsule to once weekly #12 with 0 refills and will add 4,000 units  of OTC Vitamin D daily. She will follow-up for routine testing of Vitamin D, at least 2-3 times per year to avoid over-replacement.   Prediabetes. Melanie [name] will continue to work on weight loss, exercise, and decreasing simple carbohydrates to help decrease the risk of diabetes. Refill was given for metFORMIN (GLUCOPHAGE) 500 MG tablet #180 with 0 refills.  At risk for diabetes mellitus. Melanie [name] was given approximately 15 minutes of diabetes education and counseling today. We discussed intensive lifestyle modifications today with an emphasis on weight loss as well as increasing exercise and decreasing simple carbohydrates in her diet. We also reviewed medication options with an emphasis on risk versus benefit of those discussed.   Repetitive spaced learning was employed today to elicit superior memory formation and behavioral change.  Class 3 severe obesity with serious comorbidity and body mass index (BMI) of 45.0 to 49.9 in adult, unspecified obesity type ([place]).  Melanie [name] is currently in the action stage of change. As such, her goal is to continue with weight loss efforts. She has agreed to the Category 2 Plan.   Exercise goals: For substantial health benefits, adults should do at least 150 minutes (2 hours and 30 minutes) a week of moderate-intensity, or 75 minutes (1 hour and 15 minutes) a week of vigorous-intensity aerobic physical activity, or an equivalent combination of moderate- and vigorous-intensity aerobic activity. Aerobic activity should be performed in episodes of at least 10 minutes, and preferably, it  should be spread throughout the week.  Behavioral modification strategies: meal planning and cooking strategies and keeping healthy foods in the home.  Melanie [name] has agreed to follow-up with our clinic in 3 weeks. She was informed of the importance of frequent follow-up visits to maximize her success with intensive lifestyle modifications for her multiple health conditions.   Objective:   Blood  pressure 110/73, pulse 64, temperature 98.4 F (36.9 C), height 5' 8"  (1.727 m), weight (!) 314 lb (142.4 kg), SpO2 98 %. Body mass index is 47.74 kg/m.  General: Cooperative, alert, well developed, in no acute distress. HEENT: Conjunctivae and lids unremarkable. Cardiovascular: Regular rhythm.  Lungs: Normal work of breathing. Neurologic: No focal deficits.   Lab Results  Component Value Date   CREATININE 1.34 (H) 01/12/2020   BUN 16 01/12/2020   NA 141 01/12/2020   K 3.3 (L) 01/12/2020   CL 100 01/12/2020   CO2 26 01/12/2020   Lab Results  Component Value Date   ALT 81 (H) 01/12/2020   AST 51 (H) 01/12/2020   ALKPHOS 171 (H) 01/12/2020   BILITOT 0.7 01/12/2020   Lab Results  Component Value Date   HGBA1C 6.0 (H) 01/12/2020   HGBA1C 5.8 (H) 09/19/2019   HGBA1C 5.6 04/16/2019   HGBA1C 5.6 12/24/2018   HGBA1C 5.5 07/25/2018   Lab Results  Component Value Date   INSULIN 82.8 (H) 01/12/2020   INSULIN 72.8 (H) 04/16/2019   INSULIN 46.3 (H) 12/24/2018   INSULIN 39.2 (H) 07/25/2018   INSULIN 30.4 (H) 05/01/2018   Lab Results  Component Value Date   TSH 0.580 09/19/2019   Lab Results  Component Value Date   CHOL 186 01/12/2020   HDL 41 01/12/2020   LDLCALC 103 (H) 01/12/2020   TRIG 245 (H) 01/12/2020   CHOLHDL 4.5 (H) 01/12/2020   Lab Results  Component Value Date   WBC 6.2 09/19/2019   HGB 13.5 09/19/2019   HCT 38.9 09/19/2019   MCV 92 09/19/2019   PLT 185 09/19/2019   Lab Results  Component Value Date   IRON 95 06/27/2013   TIBC 371 06/27/2013   FERRITIN 401 (H) 06/27/2013   Attestation Statements:   Reviewed by clinician on day of visit: allergies, medications, problem list, medical history, surgical history, family history, social history, and previous encounter notes.  IMichaelene Song, am acting as transcriptionist for Abby Potash, PA-C   I have reviewed the above documentation for accuracy and completeness, and I agree with the above. Abby Potash, PA-C

## 2020-02-08 MED ORDER — METFORMIN HCL 500 MG PO TABS: 500.0000 mg | ORAL_TABLET | Freq: Two times a day (BID) | ORAL | 0 refills | Status: DC

## 2020-02-08 MED ORDER — VITAMIN D (ERGOCALCIFEROL) 1.25 MG (50000 UNIT) PO CAPS: 50000.0000 [IU] | ORAL_CAPSULE | ORAL | 0 refills | Status: DC

## 2020-02-11 ENCOUNTER — Other Ambulatory Visit: Payer: Self-pay | Admitting: Registered Nurse

## 2020-02-11 ENCOUNTER — Ambulatory Visit (HOSPITAL_COMMUNITY)
Admission: RE | Admit: 2020-02-11 | Discharge: 2020-02-11 | Disposition: A | Discharge status: Home / Self Care | Payer: BC Managed Care – PPO | Source: Ambulatory Visit | Attending: General Surgery | Admitting: General Surgery

## 2020-02-11 ENCOUNTER — Telehealth (INDEPENDENT_AMBULATORY_CARE_PROVIDER_SITE_OTHER): Payer: Self-pay

## 2020-02-11 ENCOUNTER — Other Ambulatory Visit (INDEPENDENT_AMBULATORY_CARE_PROVIDER_SITE_OTHER): Payer: Self-pay

## 2020-02-11 ENCOUNTER — Other Ambulatory Visit: Payer: Self-pay

## 2020-02-11 DIAGNOSIS — F3289 Other specified depressive episodes: Secondary | ICD-10-CM

## 2020-02-11 DIAGNOSIS — E559 Vitamin D deficiency, unspecified: Secondary | ICD-10-CM

## 2020-02-11 MED ORDER — VITAMIN D (ERGOCALCIFEROL) 1.25 MG (50000 UNIT) PO CAPS: ORAL_CAPSULE | ORAL | 0 refills | Status: DC

## 2020-02-11 MED ORDER — BUPROPION HCL ER (SR) 150 MG PO TB12: 150.0000 mg | ORAL_TABLET | Freq: Two times a day (BID) | ORAL | 0 refills | Status: DC

## 2020-02-11 NOTE — Telephone Encounter (Signed)
Medication Refill - Medication: buPROPion (WELLBUTRIN SR) 150 MG 12 hr tablet    Has the patient contacted their pharmacy? yes (Agent: If no, request that the patient contact the pharmacy for the refill.) (Agent: If yes, when and what did the pharmacy advise?)Contact PCP  Preferred Pharmacy (with phone number or street name):  [facility] [address], [place] Phone:  [phone]  Fax:  [phone]       Agent: Please be advised that RX refills may take up to 3 business days. We ask that you follow-up with your pharmacy.

## 2020-02-11 NOTE — Telephone Encounter (Signed)
Resent the Rx with the correct instructions to the pharmacy. Melanie [name], [place]

## 2020-02-11 NOTE — Telephone Encounter (Signed)
Last OV with Melanie [name]

## 2020-02-11 NOTE — Telephone Encounter (Signed)
Melanie [name].

## 2020-02-23 ENCOUNTER — Other Ambulatory Visit (INDEPENDENT_AMBULATORY_CARE_PROVIDER_SITE_OTHER): Payer: Self-pay | Admitting: Physician Assistant

## 2020-02-23 DIAGNOSIS — Z6841 Body Mass Index (BMI) 40.0 and over, adult: Secondary | ICD-10-CM

## 2020-02-23 NOTE — Telephone Encounter (Signed)
This patient was last seen by Abby Potash, PA-C, and currently has an upcoming appt scheduled on 02/25/20 with her.

## 2020-02-25 ENCOUNTER — Other Ambulatory Visit: Payer: Self-pay | Admitting: Registered Nurse

## 2020-02-25 ENCOUNTER — Other Ambulatory Visit (INDEPENDENT_AMBULATORY_CARE_PROVIDER_SITE_OTHER): Payer: Self-pay | Admitting: Physician Assistant

## 2020-02-25 ENCOUNTER — Ambulatory Visit (INDEPENDENT_AMBULATORY_CARE_PROVIDER_SITE_OTHER): Payer: BC Managed Care – PPO | Admitting: Physician Assistant

## 2020-02-25 DIAGNOSIS — K219 Gastro-esophageal reflux disease without esophagitis: Secondary | ICD-10-CM

## 2020-02-25 DIAGNOSIS — F419 Anxiety disorder, unspecified: Secondary | ICD-10-CM

## 2020-02-25 DIAGNOSIS — E038 Other specified hypothyroidism: Secondary | ICD-10-CM

## 2020-02-25 MED ORDER — CITALOPRAM HYDROBROMIDE 40 MG PO TABS: 40.0000 mg | ORAL_TABLET | Freq: Every day | ORAL | 0 refills | Status: DC

## 2020-02-25 NOTE — Telephone Encounter (Signed)
Pt was last seen by Abby Potash, PA-C and currently does not have a follow up scheduled with her.

## 2020-02-25 NOTE — Telephone Encounter (Signed)
Patient is requesting a refill of the following medications: Requested Prescriptions   Pending Prescriptions Disp Refills  . citalopram (CELEXA) 40 MG tablet 90 tablet 0    Sig: Take 1 tablet (40 mg total) by mouth daily.    Date of patient request: 02/25/20 Last office visit: 11/10/19 CPE Date of last refill: 06/22/19 Last refill amount: 90 + 1 Follow up time period per chart: none scheduled yet

## 2020-02-25 NOTE — Telephone Encounter (Signed)
Pt called in and stated that she is needing a refill on Rx listed blow stated she has a refill left but pharmacy doesn't have it. Pt is leaving out of town on Saturday and needs this Rx filled. Please advise.  citalopram (CELEXA) 40 MG tablet [[id number]]   [name] [facility] [address] [place]

## 2020-03-20 ENCOUNTER — Other Ambulatory Visit: Payer: Self-pay | Admitting: Adult Health

## 2020-03-20 ENCOUNTER — Other Ambulatory Visit (INDEPENDENT_AMBULATORY_CARE_PROVIDER_SITE_OTHER): Payer: Self-pay | Admitting: Physician Assistant

## 2020-03-20 DIAGNOSIS — E559 Vitamin D deficiency, unspecified: Secondary | ICD-10-CM

## 2020-03-29 ENCOUNTER — Telehealth (INDEPENDENT_AMBULATORY_CARE_PROVIDER_SITE_OTHER): Payer: BC Managed Care – PPO | Admitting: Physician Assistant

## 2020-03-29 DIAGNOSIS — K219 Gastro-esophageal reflux disease without esophagitis: Secondary | ICD-10-CM

## 2020-03-29 DIAGNOSIS — E7849 Other hyperlipidemia: Secondary | ICD-10-CM

## 2020-03-29 DIAGNOSIS — E038 Other specified hypothyroidism: Secondary | ICD-10-CM | POA: Diagnosis not present

## 2020-03-29 DIAGNOSIS — Z6841 Body Mass Index (BMI) 40.0 and over, adult: Secondary | ICD-10-CM

## 2020-03-29 MED ORDER — BD PEN NEEDLE NANO 2ND GEN 32G X 4 MM MISC
1.0000 | Freq: Every day | 0 refills | Status: DC
Start: 2020-03-29 — End: 2020-05-21

## 2020-03-29 MED ORDER — OMEPRAZOLE 20 MG PO CPDR: 20.0000 mg | DELAYED_RELEASE_CAPSULE | Freq: Every day | ORAL | 0 refills | Status: DC

## 2020-03-29 MED ORDER — LEVOTHYROXINE SODIUM 88 MCG PO TABS: 88.0000 ug | ORAL_TABLET | Freq: Every day | ORAL | 0 refills | Status: DC

## 2020-03-29 MED ORDER — SAXENDA 18 MG/3ML ~~LOC~~ SOPN: 3.0000 mg | PEN_INJECTOR | Freq: Every day | SUBCUTANEOUS | 0 refills | Status: DC

## 2020-03-31 NOTE — Progress Notes (Signed)
TeleHealth Visit:  Due to the COVID-19 pandemic, this visit was completed with telemedicine (audio/video) technology to reduce patient and provider exposure as well as to preserve personal protective equipment.   Brianni has verbally consented to this TeleHealth visit. The patient is located at home, the provider is located at the [facility] office. The participants in this visit include the listed provider and patient. The visit was conducted today via MyChart video.   Chief Complaint: OBESITY Bina is here to discuss her progress with her obesity treatment plan along with follow-up of her obesity related diagnoses. Julieann is on the Category 2 Plan and states she is following her eating plan approximately 75% of the time. Pete states she is doing 0 minutes 0 times per week.  Today's visit was #: 12 Starting weight: 338 lbs Starting date: 05/23/2017  Interim History: Yarelis is on Saxenda 2.4 mg, which is helping with her appetite. She reports that she is doing well with her plan, is doing less stress eating and doing well with portion control. She reports that her weight yesterday was 297 lbs. She continues to go to the bariatric clinic for visits prior to being scheduled for her surgery.  Subjective:   1. Other hyperlipidemia Adraine sees the lipid clinic, with Dr. Debara Pickett. She is on pravastatin and Omega 3's.  2. Other specified hypothyroidism Ashwika is on levothyroxine, and she denies excessive hair los or fatigue.  3. Gastroesophageal reflux disease, unspecified whether esophagitis present Kinnedy denies reflux symptoms, and she denies hematochezia or blood in her stool.  Assessment/Plan:   1. Other hyperlipidemia Cardiovascular risk and specific lipid/LDL goals reviewed. We discussed several lifestyle modifications today. Marque will continue to work on diet, exercise and weight loss efforts. She will follow up with Dr. Debara Pickett at the lipid clinic. Orders and follow up as documented  in patient record.   Counseling Intensive lifestyle modifications are the first line treatment for this issue. . Dietary changes: Increase soluble fiber. Decrease simple carbohydrates. . Exercise changes: Moderate to vigorous-intensity aerobic activity 150 minutes per week if tolerated. . Lipid-lowering medications: see documented in medical record.  2. Other specified hypothyroidism Patient with long-standing hypothyroidism. We will refill levothyroxine for 1 month. Florence appears euthyroid. Orders and follow up as documented in patient record.  Counseling . Good thyroid control is important for overall health. Supratherapeutic thyroid levels are dangerous and will not improve weight loss results. . The correct way to take levothyroxine is fasting, with water, separated by at least 30 minutes from breakfast, and separated by more than 4 hours from calcium, iron, multivitamins, acid reflux medications (PPIs).   - levothyroxine (SYNTHROID) 88 MCG tablet; Take 1 tablet (88 mcg total) by mouth daily before breakfast.  Dispense: 90 tablet; Refill: 0  3. Gastroesophageal reflux disease, unspecified whether esophagitis present Intensive lifestyle modifications are the first line treatment for this issue. We discussed several lifestyle modifications today. We will refill omeprazole for 1 month. Krithika will continue to work on diet, exercise and weight loss efforts. Orders and follow up as documented in patient record.   Counseling . If a person has gastroesophageal reflux disease (GERD), food and stomach acid move back up into the esophagus and cause symptoms or problems such as damage to the esophagus. . Anti-reflux measures include: raising the head of the bed, avoiding tight clothing or belts, avoiding eating late at night, not lying down shortly after mealtime, and achieving weight loss. . Avoid ASA, NSAID's, caffeine,  alcohol, and tobacco.  . OTC Pepcid and/or Tums are often very helpful for as  needed use.  Marland Kitchen However, for persisting chronic or daily symptoms, stronger medications like Omeprazole may be needed. . You may need to avoid foods and drinks such as: ? Coffee and tea (with or without caffeine). ? Drinks that contain alcohol. ? Energy drinks and sports drinks. ? Bubbly (carbonated) drinks or sodas. ? Chocolate and cocoa. ? Peppermint and mint flavorings. ? Garlic and onions. ? Horseradish. ? Spicy and acidic foods. These include peppers, chili powder, curry powder, vinegar, hot sauces, and BBQ sauce. ? Citrus fruit juices and citrus fruits, such as oranges, lemons, and limes. ? Tomato-based foods. These include red sauce, chili, salsa, and pizza with red sauce. ? Fried and fatty foods. These include donuts, french fries, potato chips, and high-fat dressings. ? High-fat meats. These include hot dogs, rib eye steak, sausage, ham, and bacon.  - omeprazole (PRILOSEC) 20 MG capsule; Take 1 capsule (20 mg total) by mouth daily.  Dispense: 90 capsule; Refill: 0  4. 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 As such, her goal is to continue with weight loss efforts. She has agreed to the Category 2 Plan.   We discussed various medication options to help Eular with her weight loss efforts and we both agreed to continue Saxenda and we will refill for 1 month, and we will refill pen needles #100 with no refills.  - Liraglutide -Weight Management (SAXENDA) 18 MG/3ML SOPN; Inject 3 mg into the skin daily.  Dispense: 3 mL; Refill: 0 - Insulin Pen Needle (BD PEN NEEDLE NANO 2ND GEN) 32G X 4 MM MISC; 1 Package by Does not apply route daily.  Dispense: 100 each; Refill: 0  Exercise goals: No exercise has been prescribed at this time.  Behavioral modification strategies: meal planning and cooking strategies, avoiding temptations and planning for  success.  Kindel has agreed to follow-up with our clinic in 3 to 4 weeks. She was informed of the importance of frequent follow-up visits to maximize her success with intensive lifestyle modifications for her multiple health conditions.  Objective:   VITALS: Per patient if applicable, see vitals. GENERAL: Alert and in no acute distress. CARDIOPULMONARY: No increased WOB. Speaking in clear sentences.  PSYCH: Pleasant and cooperative. Speech normal rate and rhythm. Affect is appropriate. Insight and judgement are appropriate. Attention is focused, linear, and appropriate.  NEURO: Oriented as arrived to appointment on time with no prompting.   Lab Results  Component Value Date   CREATININE 1.34 (H) 01/12/2020   BUN 16 01/12/2020   NA 141 01/12/2020   K 3.3 (L) 01/12/2020   CL 100 01/12/2020   CO2 26 01/12/2020   Lab Results  Component Value Date   ALT 81 (H) 01/12/2020   AST 51 (H) 01/12/2020   ALKPHOS 171 (H) 01/12/2020   BILITOT 0.7 01/12/2020   Lab Results  Component Value Date   HGBA1C 6.0 (H) 01/12/2020   HGBA1C 5.8 (H) 09/19/2019   HGBA1C 5.6 04/16/2019   HGBA1C 5.6 12/24/2018   HGBA1C 5.5 07/25/2018   Lab Results  Component Value Date   INSULIN 82.8 (H) 01/12/2020   INSULIN 72.8 (H) 04/16/2019   INSULIN 46.3 (H) 12/24/2018   INSULIN 39.2 (H) 07/25/2018   INSULIN 30.4 (H) 05/01/2018   Lab Results  Component Value  Date   TSH 0.580 09/19/2019   Lab Results  Component Value Date   CHOL 186 01/12/2020   HDL 41 01/12/2020   LDLCALC 103 (H) 01/12/2020   TRIG 245 (H) 01/12/2020   CHOLHDL 4.5 (H) 01/12/2020   Lab Results  Component Value Date   WBC 6.2 09/19/2019   HGB 13.5 09/19/2019   HCT 38.9 09/19/2019   MCV 92 09/19/2019   PLT 185 09/19/2019   Lab Results  Component Value Date   IRON 95 06/27/2013   TIBC 371 06/27/2013   FERRITIN 401 (H) 06/27/2013    Attestation Statements:   Reviewed by clinician on day of visit: allergies, medications, problem  list, medical history, surgical history, family history, social history, and previous encounter notes.   Wilhemena Durie, am acting as transcriptionist for [employer], PA-C.  I have reviewed the above documentation for accuracy and completeness, and I agree with the above. Abby Potash, PA-C

## 2020-04-05 ENCOUNTER — Encounter (INDEPENDENT_AMBULATORY_CARE_PROVIDER_SITE_OTHER): Payer: Self-pay | Admitting: Physician Assistant

## 2020-04-05 NOTE — Telephone Encounter (Signed)
Last OV with Linus Orn

## 2020-04-22 ENCOUNTER — Other Ambulatory Visit: Payer: Self-pay | Admitting: Registered Nurse

## 2020-04-22 ENCOUNTER — Other Ambulatory Visit (INDEPENDENT_AMBULATORY_CARE_PROVIDER_SITE_OTHER): Payer: Self-pay | Admitting: Physician Assistant

## 2020-04-22 DIAGNOSIS — R7303 Prediabetes: Secondary | ICD-10-CM

## 2020-04-22 DIAGNOSIS — F3289 Other specified depressive episodes: Secondary | ICD-10-CM

## 2020-04-22 NOTE — Telephone Encounter (Signed)
Last OV with Linus Orn

## 2020-04-22 NOTE — Telephone Encounter (Signed)
Don't fill. Patient will need appt with labwork before refilling. Thanks

## 2020-05-04 ENCOUNTER — Telehealth (INDEPENDENT_AMBULATORY_CARE_PROVIDER_SITE_OTHER): Payer: Self-pay

## 2020-05-04 NOTE — Telephone Encounter (Signed)
See my chart message

## 2020-05-06 ENCOUNTER — Other Ambulatory Visit (INDEPENDENT_AMBULATORY_CARE_PROVIDER_SITE_OTHER): Payer: Self-pay | Admitting: Physician Assistant

## 2020-05-07 ENCOUNTER — Other Ambulatory Visit: Payer: Self-pay | Admitting: Registered Nurse

## 2020-05-07 DIAGNOSIS — F3289 Other specified depressive episodes: Secondary | ICD-10-CM

## 2020-05-07 MED ORDER — BUPROPION HCL ER (SR) 150 MG PO TB12: 150.0000 mg | ORAL_TABLET | Freq: Two times a day (BID) | ORAL | 0 refills | Status: DC

## 2020-05-07 NOTE — Telephone Encounter (Signed)
Copied from [place] [id number]. Topic: Quick Communication - Rx Refill/Question >> May 07, 2020  2:36 PM Leward Quan A wrote: Medication: buPROPion (WELLBUTRIN SR) 150 MG 12 hr tablet   Has the patient contacted their pharmacy? Yes.   (Agent: If no, request that the patient contact the pharmacy for the refill.) (Agent: If yes, when and what did the pharmacy advise?)  Preferred Pharmacy (with phone number or street name): [facility] [address], [place]  Phone:  [phone] Fax:  [phone]     Agent: Please be advised that RX refills may take up to 3 business days. We ask that you follow-up with your pharmacy.

## 2020-05-10 ENCOUNTER — Ambulatory Visit: Payer: Self-pay | Admitting: General Surgery

## 2020-05-10 ENCOUNTER — Other Ambulatory Visit: Payer: Self-pay

## 2020-05-10 ENCOUNTER — Encounter: Payer: BC Managed Care – PPO | Attending: General Surgery | Admitting: Skilled Nursing Facility1

## 2020-05-10 DIAGNOSIS — Z6841 Body Mass Index (BMI) 40.0 and over, adult: Secondary | ICD-10-CM | POA: Insufficient documentation

## 2020-05-12 NOTE — Progress Notes (Signed)
Pre-Operative Nutrition Class:  Appt start time: 5521   End time:  1830.  Patient was seen on 05/10/2020 for Pre-Operative Bariatric Surgery Education [facility]    Surgery date: 05/31/2020 Surgery type: sleeve Start weight at [facility]: 324 Weight today: virtual class; pt identified via name and DOB, pt agrees to the limitations of this visit type  The following the learning objectives were met by the patient during this course:  Identify Pre-Op Dietary Goals and will begin 2 weeks pre-operatively  Identify appropriate sources of fluids and proteins   State protein recommendations and appropriate sources pre and post-operatively  Identify Post-Operative Dietary Goals and will follow for 2 weeks post-operatively  Identify appropriate multivitamin and calcium sources  Describe the need for physical activity post-operatively and will follow MD recommendations  State when to call healthcare provider regarding medication questions or post-operative complications  Handouts given during class include:  Pre-Op Bariatric Surgery Diet Handout  Protein Shake Handout  Post-Op Bariatric Surgery Nutrition Handout  [facility] Program Information Flyer  Support Group Information Flyer  [facility] Bariatric Supplements Price List  Follow-Up Plan: Patient will follow-up at [facility] 2 weeks post operatively for diet advancement per MD.

## 2020-05-15 ENCOUNTER — Encounter (INDEPENDENT_AMBULATORY_CARE_PROVIDER_SITE_OTHER): Payer: Self-pay | Admitting: Physician Assistant

## 2020-05-17 NOTE — Telephone Encounter (Signed)
I have not contacted the patient to schedule.  Please advise.  Thank you.

## 2020-05-17 NOTE — Telephone Encounter (Signed)
Please advise 

## 2020-05-17 NOTE — Telephone Encounter (Signed)
No need to schedule per Linus Orn.

## 2020-05-17 NOTE — Telephone Encounter (Signed)
No need to schedule. Thanks

## 2020-05-21 ENCOUNTER — Encounter: Payer: Self-pay | Admitting: Registered Nurse

## 2020-05-21 ENCOUNTER — Telehealth: Payer: Self-pay | Admitting: Registered Nurse

## 2020-05-21 ENCOUNTER — Other Ambulatory Visit: Payer: Self-pay

## 2020-05-21 ENCOUNTER — Ambulatory Visit: Payer: BC Managed Care – PPO | Admitting: Registered Nurse

## 2020-05-21 VITALS — BP 112/65 | HR 75 | Temp 98.0°F | Resp 18 | Ht 68.0 in | Wt 289.0 lb

## 2020-05-21 DIAGNOSIS — E038 Other specified hypothyroidism: Secondary | ICD-10-CM

## 2020-05-21 DIAGNOSIS — R519 Headache, unspecified: Secondary | ICD-10-CM

## 2020-05-21 DIAGNOSIS — F3289 Other specified depressive episodes: Secondary | ICD-10-CM

## 2020-05-21 DIAGNOSIS — K219 Gastro-esophageal reflux disease without esophagitis: Secondary | ICD-10-CM

## 2020-05-21 DIAGNOSIS — Z6841 Body Mass Index (BMI) 40.0 and over, adult: Secondary | ICD-10-CM

## 2020-05-21 DIAGNOSIS — F418 Other specified anxiety disorders: Secondary | ICD-10-CM

## 2020-05-21 DIAGNOSIS — F419 Anxiety disorder, unspecified: Secondary | ICD-10-CM

## 2020-05-21 DIAGNOSIS — I10 Essential (primary) hypertension: Secondary | ICD-10-CM

## 2020-05-21 DIAGNOSIS — J3089 Other allergic rhinitis: Secondary | ICD-10-CM

## 2020-05-21 DIAGNOSIS — E559 Vitamin D deficiency, unspecified: Secondary | ICD-10-CM

## 2020-05-21 DIAGNOSIS — R7303 Prediabetes: Secondary | ICD-10-CM | POA: Diagnosis not present

## 2020-05-21 DIAGNOSIS — R42 Dizziness and giddiness: Secondary | ICD-10-CM

## 2020-05-21 DIAGNOSIS — T7840XS Allergy, unspecified, sequela: Secondary | ICD-10-CM

## 2020-05-21 MED ORDER — LORAZEPAM 1 MG PO TABS: 1.0000 mg | ORAL_TABLET | Freq: Two times a day (BID) | ORAL | 0 refills | Status: DC | PRN

## 2020-05-21 MED ORDER — LEVOTHYROXINE SODIUM 88 MCG PO TABS: 88.0000 ug | ORAL_TABLET | Freq: Every day | ORAL | 0 refills | Status: DC

## 2020-05-21 MED ORDER — BD PEN NEEDLE NANO 2ND GEN 32G X 4 MM MISC: 1.0000 | Freq: Every day | 0 refills | Status: DC

## 2020-05-21 MED ORDER — BUPROPION HCL ER (SR) 150 MG PO TB12: 150.0000 mg | ORAL_TABLET | Freq: Two times a day (BID) | ORAL | 0 refills | Status: DC

## 2020-05-21 MED ORDER — CHLORTHALIDONE 25 MG PO TABS
25.0000 mg | ORAL_TABLET | Freq: Every day | ORAL | 0 refills | Status: DC
Start: 2020-05-21 — End: 2020-06-02

## 2020-05-21 MED ORDER — SAXENDA 18 MG/3ML ~~LOC~~ SOPN
2.4000 mg | PEN_INJECTOR | Freq: Every day | SUBCUTANEOUS | 1 refills | Status: DC
Start: 2020-05-21 — End: 2020-06-02

## 2020-05-21 MED ORDER — VITAMIN D (ERGOCALCIFEROL) 1.25 MG (50000 UNIT) PO CAPS: 50000.0000 [IU] | ORAL_CAPSULE | ORAL | 3 refills | Status: DC

## 2020-05-21 MED ORDER — OMEPRAZOLE 20 MG PO CPDR: 20.0000 mg | DELAYED_RELEASE_CAPSULE | Freq: Every day | ORAL | 0 refills | Status: DC

## 2020-05-21 MED ORDER — BUSPIRONE HCL 15 MG PO TABS: 15.0000 mg | ORAL_TABLET | Freq: Two times a day (BID) | ORAL | 3 refills | Status: DC

## 2020-05-21 MED ORDER — METFORMIN HCL 500 MG PO TABS: 500.0000 mg | ORAL_TABLET | Freq: Two times a day (BID) | ORAL | 0 refills | Status: DC

## 2020-05-21 MED ORDER — FLUTICASONE PROPIONATE 50 MCG/ACT NA SUSP: 2.0000 | Freq: Every day | NASAL | 3 refills | Status: DC

## 2020-05-21 MED ORDER — CITALOPRAM HYDROBROMIDE 40 MG PO TABS: 40.0000 mg | ORAL_TABLET | Freq: Every day | ORAL | 0 refills | Status: DC

## 2020-05-21 MED ORDER — TOPIRAMATE 100 MG PO TABS: 100.0000 mg | ORAL_TABLET | Freq: Every day | ORAL | 0 refills | Status: DC

## 2020-05-21 MED ORDER — FLUTICASONE PROPIONATE 50 MCG/ACT NA SUSP: 2.0000 | Freq: Two times a day (BID) | NASAL | 3 refills | Status: DC | PRN

## 2020-05-21 MED ORDER — PITAVASTATIN CALCIUM 2 MG PO TABS: 1.0000 | ORAL_TABLET | Freq: Every day | ORAL | 11 refills | Status: DC

## 2020-05-21 MED ORDER — UBRELVY 50 MG PO TABS
50.0000 mg | ORAL_TABLET | ORAL | 2 refills | Status: DC | PRN
Start: 2020-05-21 — End: 2022-06-28

## 2020-05-21 MED ORDER — MONTELUKAST SODIUM 10 MG PO TABS
10.0000 mg | ORAL_TABLET | Freq: Every day | ORAL | 3 refills | Status: DC
Start: 2020-05-21 — End: 2021-06-27

## 2020-05-21 NOTE — Patient Instructions (Signed)
° ° ° °  If you have lab work done today you will be contacted with your lab results within the next 2 weeks.  If you have not heard from us then please contact us. The fastest way to get your results is to register for My Chart. ° ° °IF you received an x-ray today, you will receive an invoice from [facility]. Please contact  Radiology at [phone] with questions or concerns regarding your invoice.  ° °IF you received labwork today, you will receive an invoice from [facility]. Please contact [facility] at [phone] with questions or concerns regarding your invoice.  ° °Our billing staff will not be able to assist you with questions regarding bills from these companies. ° °You will be contacted with the lab results as soon as they are available. The fastest way to get your results is to activate your My Chart account. Instructions are located on the last page of this paperwork. If you have not heard from us regarding the results in 2 weeks, please contact this office. °  ° ° ° °

## 2020-05-21 NOTE — Progress Notes (Signed)
Established Patient Office Visit  Subjective:  Patient ID: Melanie [name], female    DOB: 15-Jun-1975  Age: 45 y.o. MRN: [id number]  CC:  Chief Complaint  Patient presents with  . Medication Management    Patient states she would like to discuss medications and going into surgery next week.    HPI [name] presents for med management  Has upcoming bariatric surgery - will be transitioning from [facility] to [facility] care, wants to ensure medications filled here.  Discussed liraglutide - benefits of continuing this vs. Convenience of stopping. Will continue to consider.  Discussed holding meds per anesthesiology - I defer to them on this but discussed sedating medications like topiramate, lorazepam, sumatriptan, and others will be important to discuss. Encourage her to discuss topical NSAID that she uses for OA of L knee with the surgical group as well  Otherwise no acute concerns, feeling well, looking forward to procedure.  Past Medical History:  Diagnosis Date  . Allergy   . Anemia   . Anxiety   . Back pain   . Constipation   . COVID-19 05/27/2019  . Depression   . Fatty liver   . GERD (gastroesophageal reflux disease)   . Hyperlipidemia   . Infertility, female   . Joint pain   . Kidney problem   . 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 Migraines    LIGHT SENSITIVITY , NOW ON IMITREX SINCE 08-02-2018,NOTICES IMPROVEMENT   . Multiple food allergies   . Muscle pain   . Numbness and tingling in both hands   . Pre-diabetes   . Shortness of breath    was due to a medication side effect, now off medication , SOB resolved   . Sleep apnea   . Stress   . Swelling of both lower extremities     Past Surgical History:  Procedure Laterality Date  . ELBOW LIGAMENT RECONSTRUCTION Right 08/21/2018   Procedure: LATERAL COLLATERAL LIGAMENT REPAIR;  Surgeon: Bjorn Pippin, MD;  Location: [facility];  Service: Orthopedics;  Laterality:  Right;  . FINGER SURGERY Left     index finger  . KNEE SURGERY Left   . LIVER BIOPSY N/A 05/15/2014   Procedure: LIVER BIOPSY;  Surgeon: Louis Meckel, MD;  Location: [facility];  Service: Endoscopy;  Laterality: N/A;  ultrasound to mark the liver  . TENNIS ELBOW RELEASE/NIRSCHEL PROCEDURE Right 08/21/2018   Procedure: TENNIS ELBOW RELEASE/NIRSCHEL PROCEDURE;  Surgeon: Bjorn Pippin, MD;  Location: [facility];  Service: Orthopedics;  Laterality: Right;  . TONSILLECTOMY AND ADENOIDECTOMY  1978  . TYMPANOSTOMY TUBE PLACEMENT    . ULNAR NERVE TRANSPOSITION Right 08/21/2018   Procedure: ULNAR NERVE DECOMPRESSION/TRANSPOSITION;  Surgeon: Bjorn Pippin, MD;  Location: [facility];  Service: Orthopedics;  Laterality: Right;  . wrist cyst Left     Family History  Problem Relation Age of Onset  . Diabetes Mother   . Liver disease Mother        ESLD- unknown etiology  . Obesity Mother   . Kidney disease Mother   . Diabetes Maternal Grandmother   . Diabetes Paternal Grandmother   . Breast cancer Paternal Grandmother   . Stroke Maternal Grandfather   . Hyperlipidemia Father     Social History   Socioeconomic History  . Marital status: Married    Spouse name: [name]  . Number of children: 5  . Years of education: in [place] program  .  Highest education level: Bachelor's degree (e.g., BA, AB, BS)  Occupational History  . Occupation: Magazine features editor: [employer]    Comment: ESL  Tobacco Use  . Smoking status: Never Smoker  . Smokeless tobacco: Never Used  Vaping Use  . Vaping Use: Never used  Substance and Sexual Activity  . Alcohol use: No    Alcohol/week: 0.0 standard drinks  . Drug use: No  . Sexual activity: Yes    Partners: Male    Birth control/protection: None    Comment: 1st intercourse 28yo-1 partner  Other Topics Concern  . Not on file  Social History Narrative   Lives with her husband and their 5 adpoted children.    Adopted 2 sets of siblings, all with  psychiatric diagnoses (ADHD, Asperger's, ODD, PTSD, depression, bipolar disorder).   Her husband has Asperger's, and works in disability services.   Occasionally drinks coke or pepsi    Social Determinants of Health   Financial Resource Strain: Not on file  Food Insecurity: Not on file  Transportation Needs: Not on file  Physical Activity: Not on file  Stress: Not on file  Social Connections: Not on file  Intimate Partner Violence: Not on file    Outpatient Medications Prior to Visit  Medication Sig Dispense Refill  . Alirocumab (PRALUENT) 150 MG/ML SOAJ Inject 1 Dose into the skin every 14 (fourteen) days. (Patient taking differently: Inject 150 mg into the skin every 14 (fourteen) days.) 2 pen 11  . cetirizine (ZYRTEC) 10 MG tablet Take 10 mg by mouth at bedtime.     . cholecalciferol (VITAMIN D) 25 MCG (1000 UNIT) tablet Take 4,000 Units by mouth daily.    . Multiple Vitamin (MULTIVITAMIN WITH MINERALS) TABS tablet Take 1 tablet by mouth daily.    . Omega-3 Fatty Acids (FISH OIL) 1200 MG CAPS Take 1,200 mg by mouth in the morning and at bedtime.    Bertram Gala Glycol-Propyl Glycol (LUBRICANT EYE DROPS) 0.4-0.3 % SOLN Place 1 drop into both eyes 3 (three) times daily as needed (dry/irritated eyes).    Marland Kitchen buPROPion (WELLBUTRIN SR) 150 MG 12 hr tablet Take 1 tablet (150 mg total) by mouth 2 (two) times daily. 180 tablet 0  . busPIRone (BUSPAR) 15 MG tablet Take 1 tablet (15 mg total) by mouth 2 (two) times daily. 180 tablet 3  . chlorthalidone (HYGROTON) 25 MG tablet Take 1 tablet (25 mg total) by mouth daily. 90 tablet 0  . citalopram (CELEXA) 40 MG tablet Take 1 tablet (40 mg total) by mouth daily. 90 tablet 0  . fluticasone (FLONASE) 50 MCG/ACT nasal spray Place 2 sprays into both nostrils daily. (Patient taking differently: Place 2 sprays into both nostrils 2 (two) times daily as needed for allergies.) 48 g 3  . Insulin Pen Needle (BD PEN NEEDLE NANO 2ND GEN) 32G X 4 MM MISC 1 Package  by Does not apply route daily. 100 each 0  . levothyroxine (SYNTHROID) 88 MCG tablet Take 1 tablet (88 mcg total) by mouth daily before breakfast. 90 tablet 0  . Liraglutide -Weight Management (SAXENDA) 18 MG/3ML SOPN Inject 3 mg into the skin daily. (Patient taking differently: Inject 2.4 mg into the skin daily.) 3 mL 0  . LORazepam (ATIVAN) 1 MG tablet Take 1 tablet (1 mg total) by mouth 2 (two) times daily as needed for anxiety. 20 tablet 0  . metFORMIN (GLUCOPHAGE) 500 MG tablet Take 1 tablet (500 mg total) by mouth 2 (  two) times daily. 180 tablet 0  . montelukast (SINGULAIR) 10 MG tablet Take 1 tablet by mouth at bedtime. (Patient taking differently: Take 10 mg by mouth at bedtime.) 90 tablet 3  . omeprazole (PRILOSEC) 20 MG capsule Take 1 capsule (20 mg total) by mouth daily. 90 capsule 0  . Pitavastatin Calcium 2 MG TABS Take 1 tablet (2 mg total) by mouth daily. 30 tablet 11  . topiramate (TOPAMAX) 100 MG tablet Take 1 tablet by mouth at bedtime. (Patient taking differently: Take 100 mg by mouth at bedtime.) 90 tablet 0  . Ubrogepant (UBRELVY) 50 MG TABS Take 50 mg by mouth as needed (may repeat once after 2 hours). (Patient taking differently: Take 50 mg by mouth every 2 (two) hours as needed (may repeat once after 2 hours).) 20 tablet 3  . Vitamin D, Ergocalciferol, (DRISDOL) 1.25 MG (50000 UNIT) CAPS capsule One tab every 7 days (Patient taking differently: Take 50,000 Units by mouth every Sunday.) 12 capsule 0  . SUMAtriptan (IMITREX) 50 MG tablet TAKE ONE TABLET BY MOUTH AT ONSET OF HEADACHE; MAY REPEAT ONE TABLET IN 2 HOURS IF NEEDED. (Patient taking differently: Take 50 mg by mouth See admin instructions. Take 1 tablet at onset of headache; may repeat one tablet in 2 hours if needed.) 10 tablet 0   No facility-administered medications prior to visit.    Allergies  Allergen Reactions  . Welchol [Colesevelam] Shortness Of Breath  . Lidocaine Other (See Comments)    Flu like sx's  .  Pistachio Nut (Diagnostic) Itching and Swelling    Swollen tongue/itchy mouth  . Relafen [Nabumetone] Other (See Comments)    migraine  . Celebrex [Celecoxib] Rash  . Sulfa Antibiotics Rash    ROS Review of Systems  Constitutional: Negative.   HENT: Negative.   Eyes: Negative.   Respiratory: Negative.   Cardiovascular: Negative.   Gastrointestinal: Negative.   Genitourinary: Negative.   Musculoskeletal: Negative.   Skin: Negative.   Neurological: Negative.   Psychiatric/Behavioral: Negative.   All other systems reviewed and are negative.     Objective:    Physical Exam Vitals and nursing note reviewed.  Constitutional:      General: She is not in acute distress.    Appearance: Normal appearance. She is obese. She is not ill-appearing, toxic-appearing or diaphoretic.  Cardiovascular:     Rate and Rhythm: Normal rate and regular rhythm.     Heart sounds: Normal heart sounds. No murmur heard. No friction rub. No gallop.   Pulmonary:     Effort: Pulmonary effort is normal. No respiratory distress.     Breath sounds: Normal breath sounds. No stridor. No wheezing, rhonchi or rales.  Chest:     Chest wall: No tenderness.  Skin:    General: Skin is warm and dry.  Neurological:     General: No focal deficit present.     Mental Status: She is alert and oriented to person, place, and time. Mental status is at baseline.  Psychiatric:        Mood and Affect: Mood normal.        Behavior: Behavior normal.        Thought Content: Thought content normal.        Judgment: Judgment normal.     BP 112/65   Pulse 75   Temp 98 F (36.7 C) (Temporal)   Resp 18   Ht 5\' 8"  (1.727 m)   Wt 289 lb (131.1 kg)   SpO2  96%   BMI 43.94 kg/m  Wt Readings from Last 3 Encounters:  05/21/20 289 lb (131.1 kg)  02/02/20 (!) 314 lb (142.4 kg)  01/20/20 (!) 324 lb (147 kg)     There are no preventive care reminders to display for this patient.  There are no preventive care reminders  to display for this patient.  Lab Results  Component Value Date   TSH 0.580 09/19/2019   Lab Results  Component Value Date   WBC 6.2 09/19/2019   HGB 13.5 09/19/2019   HCT 38.9 09/19/2019   MCV 92 09/19/2019   PLT 185 09/19/2019   Lab Results  Component Value Date   NA 141 01/12/2020   K 3.3 (L) 01/12/2020   CO2 26 01/12/2020   GLUCOSE 150 (H) 01/12/2020   BUN 16 01/12/2020   CREATININE 1.34 (H) 01/12/2020   BILITOT 0.7 01/12/2020   ALKPHOS 171 (H) 01/12/2020   AST 51 (H) 01/12/2020   ALT 81 (H) 01/12/2020   PROT 7.1 01/12/2020   ALBUMIN 4.6 01/12/2020   CALCIUM 11.0 (H) 01/12/2020   ANIONGAP 11 05/27/2019   Lab Results  Component Value Date   CHOL 186 01/12/2020   Lab Results  Component Value Date   HDL 41 01/12/2020   Lab Results  Component Value Date   LDLCALC 103 (H) 01/12/2020   Lab Results  Component Value Date   TRIG 245 (H) 01/12/2020   Lab Results  Component Value Date   CHOLHDL 4.5 (H) 01/12/2020   Lab Results  Component Value Date   HGBA1C 6.0 (H) 01/12/2020      Assessment & Plan:   Problem List Items Addressed This Visit      Respiratory   Perennial allergic rhinitis   Relevant Medications   fluticasone (FLONASE) 50 MCG/ACT nasal spray     Other   Depression with anxiety   Relevant Medications   citalopram (CELEXA) 40 MG tablet   buPROPion (WELLBUTRIN SR) 150 MG 12 hr tablet   busPIRone (BUSPAR) 15 MG tablet   LORazepam (ATIVAN) 1 MG tablet   Prediabetes   Relevant Medications   metFORMIN (GLUCOPHAGE) 500 MG tablet   Vitamin D deficiency   Relevant Medications   Vitamin D, Ergocalciferol, (DRISDOL) 1.25 MG (50000 UNIT) CAPS capsule   Class 3 severe obesity with serious comorbidity and body mass index (BMI) of 45.0 to 49.9 in adult [facility]) - Primary   Relevant Medications   metFORMIN (GLUCOPHAGE) 500 MG tablet   Liraglutide -Weight Management (SAXENDA) 18 MG/3ML SOPN   Pitavastatin Calcium 2 MG TABS   Insulin Pen Needle (BD  PEN NEEDLE NANO 2ND GEN) 32G X 4 MM MISC    Other Visit Diagnoses    Other specified hypothyroidism       Relevant Medications   levothyroxine (SYNTHROID) 88 MCG tablet   Anxiety       Relevant Medications   citalopram (CELEXA) 40 MG tablet   buPROPion (WELLBUTRIN SR) 150 MG 12 hr tablet   busPIRone (BUSPAR) 15 MG tablet   LORazepam (ATIVAN) 1 MG tablet   Essential hypertension       Relevant Medications   chlorthalidone (HYGROTON) 25 MG tablet   Pitavastatin Calcium 2 MG TABS   Gastroesophageal reflux disease, unspecified whether esophagitis present       Relevant Medications   omeprazole (PRILOSEC) 20 MG capsule   Other depression       with emotional eating   Relevant Medications   citalopram (CELEXA) 40  MG tablet   buPROPion (WELLBUTRIN SR) 150 MG 12 hr tablet   busPIRone (BUSPAR) 15 MG tablet   LORazepam (ATIVAN) 1 MG tablet   Other depression       Relevant Medications   citalopram (CELEXA) 40 MG tablet   buPROPion (WELLBUTRIN SR) 150 MG 12 hr tablet   busPIRone (BUSPAR) 15 MG tablet   LORazepam (ATIVAN) 1 MG tablet   Severe allergy, sequela       Relevant Medications   montelukast (SINGULAIR) 10 MG tablet   Dizziness       Nonintractable headache, unspecified chronicity pattern, unspecified headache type       Relevant Medications   Ubrogepant (UBRELVY) 50 MG TABS   citalopram (CELEXA) 40 MG tablet   buPROPion (WELLBUTRIN SR) 150 MG 12 hr tablet   topiramate (TOPAMAX) 100 MG tablet      Meds ordered this encounter  Medications  . levothyroxine (SYNTHROID) 88 MCG tablet    Sig: Take 1 tablet (88 mcg total) by mouth daily before breakfast.    Dispense:  90 tablet    Refill:  0    Order Specific Question:   Supervising Provider    Answer:   Neva Seat, JEFFREY R [[id number]]  . metFORMIN (GLUCOPHAGE) 500 MG tablet    Sig: Take 1 tablet (500 mg total) by mouth 2 (two) times daily.    Dispense:  180 tablet    Refill:  0    Order Specific Question:   Supervising  Provider    Answer:   Neva Seat, JEFFREY R [[id number]]  . Ubrogepant (UBRELVY) 50 MG TABS    Sig: Take 50 mg by mouth every 2 (two) hours as needed (may repeat once after 2 hours).    Dispense:  30 tablet    Refill:  2    Order Specific Question:   Supervising Provider    Answer:   Neva Seat, JEFFREY R [[id number]]  . citalopram (CELEXA) 40 MG tablet    Sig: Take 1 tablet (40 mg total) by mouth daily.    Dispense:  90 tablet    Refill:  0    Order Specific Question:   Supervising Provider    Answer:   Neva Seat, JEFFREY R [[id number]]  . Liraglutide -Weight Management (SAXENDA) 18 MG/3ML SOPN    Sig: Inject 2.4 mg into the skin daily.    Dispense:  30 mL    Refill:  1    Order Specific Question:   Supervising Provider    Answer:   Neva Seat, JEFFREY R [[id number]]  . chlorthalidone (HYGROTON) 25 MG tablet    Sig: Take 1 tablet (25 mg total) by mouth daily.    Dispense:  90 tablet    Refill:  0    Order Specific Question:   Supervising Provider    Answer:   Neva Seat, JEFFREY R [[id number]]  . omeprazole (PRILOSEC) 20 MG capsule    Sig: Take 1 capsule (20 mg total) by mouth daily.    Dispense:  90 capsule    Refill:  0    Order Specific Question:   Supervising Provider    Answer:   Neva Seat, JEFFREY R [[id number]]  . fluticasone (FLONASE) 50 MCG/ACT nasal spray    Sig: Place 2 sprays into both nostrils 2 (two) times daily as needed for allergies.    Dispense:  16 g    Refill:  3    Order Specific Question:   Supervising Provider    Answer:   Neva Seat, JEFFREY R [[id number]]  .  buPROPion (WELLBUTRIN SR) 150 MG 12 hr tablet    Sig: Take 1 tablet (150 mg total) by mouth 2 (two) times daily.    Dispense:  180 tablet    Refill:  0    Order Specific Question:   Supervising Provider    Answer:   Neva Seat, JEFFREY R [[id number]]  . busPIRone (BUSPAR) 15 MG tablet    Sig: Take 1 tablet (15 mg total) by mouth 2 (two) times daily.    Dispense:  180 tablet    Refill:  3    Order Specific Question:   Supervising Provider    Answer:   Neva Seat,  JEFFREY R [[id number]]  . Pitavastatin Calcium 2 MG TABS    Sig: Take 1 tablet (2 mg total) by mouth daily.    Dispense:  30 tablet    Refill:  11    Approved until 07/03/2020    Order Specific Question:   Supervising Provider    Answer:   Neva Seat, JEFFREY R [[id number]]  . Insulin Pen Needle (BD PEN NEEDLE NANO 2ND GEN) 32G X 4 MM MISC    Sig: 1 Package by Does not apply route daily.    Dispense:  100 each    Refill:  0    Order Specific Question:   Supervising Provider    Answer:   Neva Seat, JEFFREY R [[id number]]  . LORazepam (ATIVAN) 1 MG tablet    Sig: Take 1 tablet (1 mg total) by mouth 2 (two) times daily as needed for anxiety.    Dispense:  20 tablet    Refill:  0    Order Specific Question:   Supervising Provider    Answer:   Neva Seat, JEFFREY R [[id number]]  . montelukast (SINGULAIR) 10 MG tablet    Sig: Take 1 tablet (10 mg total) by mouth at bedtime.    Dispense:  90 tablet    Refill:  3    Order Specific Question:   Supervising Provider    Answer:   Neva Seat, JEFFREY R [[id number]]  . topiramate (TOPAMAX) 100 MG tablet    Sig: Take 1 tablet (100 mg total) by mouth at bedtime.    Dispense:  90 tablet    Refill:  0    Order Specific Question:   Supervising Provider    Answer:   Neva Seat, JEFFREY R [[id number]]  . Vitamin D, Ergocalciferol, (DRISDOL) 1.25 MG (50000 UNIT) CAPS capsule    Sig: Take 1 capsule (50,000 Units total) by mouth every 7 (seven) days. One tab every 7 days    Dispense:  12 capsule    Refill:  3    Order Specific Question:   Supervising Provider    Answer:   Neva Seat, JEFFREY R [[id number]]    Follow-up: No follow-ups on file.   PLAN  Pt good fit for bariatric surgery, I believe she will be successful in maintaining weight loss. Discussed benefits for cholesterol, blood pressure, sugars, msk benefits, and others.  Return in 3-4 months  Patient encouraged to call clinic with any questions, comments, or concerns.  [name], NP

## 2020-05-21 NOTE — Telephone Encounter (Signed)
Tried calling pharm no answer. Sent new Rx with directions

## 2020-05-21 NOTE — Telephone Encounter (Signed)
CRM for notification. See Telephone encounter for: 05/21/20.  Tyler calling from [facility] is calling to receive clarification on the sig for fluticasone (FLONASE) 50 MCG/ACT nasal spray [[id number]]  Currently the sig is Place 2 sprays into both nostrils 2 (two) times daily as needed for allergies.    Normally the sig states - 2 sprays in each nostrils daily or 1 spray in each nostril 2 daily.  Please advise   Cb- [phone]

## 2020-05-24 NOTE — Patient Instructions (Addendum)
DUE TO COVID-19 ONLY ONE VISITOR IS ALLOWED TO COME WITH YOU AND STAY IN THE WAITING ROOM ONLY DURING PRE OP AND PROCEDURE DAY OF SURGERY. THE 1 VISITOR  MAY VISIT WITH YOU AFTER SURGERY IN YOUR PRIVATE ROOM DURING VISITING HOURS ONLY!  YOU NEED TO HAVE A COVID 19 TEST ON: 05/28/20 @  9:00 AM , THIS TEST MUST BE DONE BEFORE SURGERY,  COVID TESTING SITE [place] [id number], IT IS ON THE RIGHT GOING OUT [address] APPROXIMATELY  2 MINUTES PAST [employer] ON THE RIGHT. ONCE YOUR COVID TEST IS COMPLETED,  PLEASE BEGIN THE QUARANTINE INSTRUCTIONS AS OUTLINED IN YOUR HANDOUT.                Melanie [name]   Your procedure is scheduled on: 05/31/20   Report to [facility] Main  Entrance   Report to admitting at: 9:00 AM     Call this number if you have problems the morning of surgery [phone]    Remember:  MORNING OF SURGERY DRINK:   DRINK 1 G2 drink BEFORE YOU LEAVE HOME, DRINK ALL OF THE  G2 DRINK AT ONE TIME.   NO SOLID FOOD AFTER 600 PM THE NIGHT BEFORE YOUR SURGERY. YOU MAY DRINK CLEAR FLUIDS. THE G2 DRINK YOU DRINK BEFORE YOU LEAVE HOME WILL BE THE LAST FLUIDS YOU DRINK BEFORE SURGERY(AT: 8:00 AM).  PAIN IS EXPECTED AFTER SURGERY AND WILL NOT BE COMPLETELY ELIMINATED. AMBULATION AND TYLENOL WILL HELP REDUCE INCISIONAL AND GAS PAIN. MOVEMENT IS KEY!  YOU ARE EXPECTED TO BE OUT OF BED WITHIN 4 HOURS OF ADMISSION TO YOUR PATIENT ROOM.  SITTING IN THE RECLINER THROUGHOUT THE DAY IS IMPORTANT FOR DRINKING FLUIDS AND MOVING GAS THROUGHOUT THE GI TRACT.  COMPRESSION STOCKINGS SHOULD BE WORN [place] UNLESS YOU ARE WALKING.   INCENTIVE SPIROMETER SHOULD BE USED EVERY HOUR WHILE AWAKE TO DECREASE POST-OPERATIVE COMPLICATIONS SUCH AS PNEUMONIA.  WHEN DISCHARGED HOME, IT IS IMPORTANT TO CONTINUE TO WALK EVERY HOUR AND USE THE INCENTIVE SPIROMETER EVERY HOUR.   CLEAR LIQUID DIET  Foods Allowed                                                                      Foods Excluded  Coffee and tea, regular and decaf                             liquids that you cannot  Plain Jell-O any favor except red or purple                                           see through such as: Fruit ices (not with fruit pulp)                                     milk, soups, orange juice  Iced Popsicles  All solid food Carbonated beverages, regular and diet                                    Cranberry, grape and apple juices Sports drinks like Gatorade Lightly seasoned clear broth or consume(fat free) Sugar, honey syrup  Sample Menu Breakfast                                Lunch                                     Supper Cranberry juice                    Beef broth                            Chicken broth Jell-O                                     Grape juice                           Apple juice Coffee or tea                        Jell-O                                      Popsicle                                                Coffee or tea                        Coffee or tea  _____________________________________________________________________   BRUSH YOUR TEETH MORNING OF SURGERY AND RINSE YOUR MOUTH OUT, NO CHEWING GUM CANDY OR MINTS.    Take these medicines the morning of surgery with A SIP OF WATER: bupropion,buspar,citalopram,levothyroxine,omeprazole.Flonase as usual.Lorazepam as needed.  How to Manage Your Diabetes Before and After Surgery  Why is it important to control my blood sugar before and after surgery? . Improving blood sugar levels before and after surgery helps healing and can limit problems. . A way of improving blood sugar control is eating a healthy diet by: o  Eating less sugar and carbohydrates o  Increasing activity/exercise o  Talking with your doctor about reaching your blood sugar goals . High blood sugars (greater than 180 mg/dL) can raise your risk of infections  and slow your recovery, so you will need to focus on controlling your diabetes during the weeks before surgery. . Make sure that the doctor who takes care of your diabetes knows about your planned surgery including the date and location.  How do I manage my blood sugar before surgery? . Check your blood sugar at least 4 times a day, starting 2 days before surgery, to make sure that the level  is not too high or low. o Check your blood sugar the morning of your surgery when you wake up and every 2 hours until you get to the Short Stay unit. . If your blood sugar is less than 70 mg/dL, you will need to treat for low blood sugar: o Do not take insulin. o Treat a low blood sugar (less than 70 mg/dL) with  cup of clear juice (cranberry or apple), 4 glucose tablets, OR glucose gel. o Recheck blood sugar in 15 minutes after treatment (to make sure it is greater than 70 mg/dL). If your blood sugar is not greater than 70 mg/dL on recheck, call [phone] for further instructions. . Report your blood sugar to the short stay nurse when you get to Short Stay.  . If you are admitted to the hospital after surgery: o Your blood sugar will be checked by the staff and you will probably be given insulin after surgery (instead of oral diabetes medicines) to make sure you have good blood sugar levels. o The goal for blood sugar control after surgery is 80-180 mg/dL.   WHAT DO I DO ABOUT MY DIABETES MEDICATION?  Marland Kitchen Do not take oral diabetes medicines (pills) the morning of surgery.  . THE DAY BEFORE SURGERY, take Metformin as usual.       . THE MORNING OF SURGERY, DO NOT take Metformin.                                You may not have any metal on your body including hair pins and              piercings  Do not wear jewelry, make-up, lotions, powders or perfumes, deodorant             Do not wear nail polish on your fingernails.  Do not shave  48 hours prior to surgery.    Do not bring valuables to the  hospital. [place].  Contacts, dentures or bridgework may not be worn into surgery.  Leave suitcase in the car. After surgery it may be brought to your room.     Patients discharged the day of surgery will not be allowed to drive home. IF YOU ARE HAVING SURGERY AND GOING HOME THE SAME DAY, YOU MUST HAVE AN ADULT TO DRIVE YOU HOME AND BE WITH YOU FOR 24 HOURS. YOU MAY GO HOME BY TAXI OR UBER OR ORTHERWISE, BUT AN ADULT MUST ACCOMPANY YOU HOME AND STAY WITH YOU FOR 24 HOURS.  Name and phone number of your driver:  Special Instructions: N/A              Please read over the following fact sheets you were given: _____________________________________________________________________        [facility] - Preparing for Surgery Before surgery, you can play an important role.  Because skin is not sterile, your skin needs to be as free of germs as possible.  You can reduce the number of germs on your skin by washing with CHG (chlorahexidine gluconate) soap before surgery.  CHG is an antiseptic cleaner which kills germs and bonds with the skin to continue killing germs even after washing. Please DO NOT use if you have an allergy to CHG or antibacterial soaps.  If your skin becomes reddened/irritated stop using the CHG  and inform your nurse when you arrive at Short Stay. Do not shave (including legs and underarms) for at least 48 hours prior to the first CHG shower.  You may shave your face/neck. Please follow these instructions carefully:  1.  Shower with CHG Soap the night before surgery and the  morning of Surgery.  2.  If you choose to wash your hair, wash your hair first as usual with your  normal  shampoo.  3.  After you shampoo, rinse your hair and body thoroughly to remove the  shampoo.                           4.  Use CHG as you would any other liquid soap.  You can apply chg directly  to the skin and wash                       Gently with a  scrungie or clean washcloth.  5.  Apply the CHG Soap to your body ONLY FROM THE NECK DOWN.   Do not use on face/ open                           Wound or open sores. Avoid contact with eyes, ears mouth and genitals (private parts).                       Wash face,  Genitals (private parts) with your normal soap.             6.  Wash thoroughly, paying special attention to the area where your surgery  will be performed.  7.  Thoroughly rinse your body with warm water from the neck down.  8.  DO NOT shower/wash with your normal soap after using and rinsing off  the CHG Soap.                9.  Pat yourself dry with a clean towel.            10.  Wear clean pajamas.            11.  Place clean sheets on your bed the night of your first shower and do not  sleep with pets. Day of Surgery : Do not apply any lotions/deodorants the morning of surgery.  Please wear clean clothes to the hospital/surgery center.  FAILURE TO FOLLOW THESE INSTRUCTIONS MAY RESULT IN THE CANCELLATION OF YOUR SURGERY PATIENT SIGNATURE_________________________________  NURSE SIGNATURE__________________________________  ________________________________________________________________________

## 2020-05-25 ENCOUNTER — Encounter (HOSPITAL_COMMUNITY)
Admission: RE | Admit: 2020-05-25 | Discharge: 2020-05-25 | Disposition: A | Discharge status: Home / Self Care | Payer: BC Managed Care – PPO | Source: Ambulatory Visit | Attending: General Surgery | Admitting: General Surgery

## 2020-05-25 ENCOUNTER — Encounter (HOSPITAL_COMMUNITY): Payer: Self-pay

## 2020-05-25 ENCOUNTER — Other Ambulatory Visit: Payer: Self-pay

## 2020-05-25 DIAGNOSIS — Z01812 Encounter for preprocedural laboratory examination: Secondary | ICD-10-CM | POA: Diagnosis not present

## 2020-05-25 HISTORY — DX: Unspecified osteoarthritis, unspecified site: M19.90

## 2020-05-25 HISTORY — DX: Thyrotoxicosis, unspecified without thyrotoxic crisis or storm: E05.90

## 2020-05-25 LAB — CBC WITH DIFFERENTIAL/PLATELET
Abs Immature Granulocytes: 0.02 10*3/uL (ref 0.00–0.07)
Basophils Absolute: 0 10*3/uL (ref 0.0–0.1)
Basophils Relative: 1 %
Eosinophils Absolute: 0.2 10*3/uL (ref 0.0–0.5)
Eosinophils Relative: 3 %
HCT: 37.6 % (ref 36.0–46.0)
Hemoglobin: 13.2 g/dL (ref 12.0–15.0)
Immature Granulocytes: 0 %
Lymphocytes Relative: 32 %
Lymphs Abs: 1.8 10*3/uL (ref 0.7–4.0)
MCH: 31.9 pg (ref 26.0–34.0)
MCHC: 35.1 g/dL (ref 30.0–36.0)
MCV: 90.8 fL (ref 80.0–100.0)
Monocytes Absolute: 0.3 10*3/uL (ref 0.1–1.0)
Monocytes Relative: 5 %
Neutro Abs: 3.4 10*3/uL (ref 1.7–7.7)
Neutrophils Relative %: 59 %
Platelets: 196 10*3/uL (ref 150–400)
RBC: 4.14 MIL/uL (ref 3.87–5.11)
RDW: 13.2 % (ref 11.5–15.5)
WBC: 5.8 10*3/uL (ref 4.0–10.5)
nRBC: 0 % (ref 0.0–0.2)

## 2020-05-25 LAB — COMPREHENSIVE METABOLIC PANEL
ALT: 63 U/L — ABNORMAL HIGH (ref 0–44)
AST: 45 U/L — ABNORMAL HIGH (ref 15–41)
Albumin: 4.5 g/dL (ref 3.5–5.0)
Alkaline Phosphatase: 118 U/L (ref 38–126)
Anion gap: 12 (ref 5–15)
BUN: 40 mg/dL — ABNORMAL HIGH (ref 6–20)
CO2: 28 mmol/L (ref 22–32)
Calcium: 11.1 mg/dL — ABNORMAL HIGH (ref 8.9–10.3)
Chloride: 97 mmol/L — ABNORMAL LOW (ref 98–111)
Creatinine, Ser: 1.7 mg/dL — ABNORMAL HIGH (ref 0.44–1.00)
GFR, Estimated: 37 mL/min — ABNORMAL LOW (ref >60)
Glucose, Bld: 105 mg/dL — ABNORMAL HIGH (ref 70–99)
Potassium: 3.3 mmol/L — ABNORMAL LOW (ref 3.5–5.1)
Sodium: 137 mmol/L (ref 135–145)
Total Bilirubin: 1.6 mg/dL — ABNORMAL HIGH (ref 0.3–1.2)
Total Protein: 7.3 g/dL (ref 6.5–8.1)

## 2020-05-25 LAB — HEMOGLOBIN A1C
Hgb A1c MFr Bld: 4.7 % — ABNORMAL LOW (ref 4.8–5.6)
Mean Plasma Glucose: 88.19 mg/dL

## 2020-05-25 NOTE — Progress Notes (Addendum)
COVID Vaccine Completed: Yes Date COVID Vaccine completed: 03/2020 [place] COVID vaccine manufacturer: [place]      PCP - Maximiano Coss: NP  Cardiologist - Dr. Lyman Bishop  Chest x-ray - 02/11/20 EKG - 02/11/20 Stress Test -  ECHO - 05/25/17 Cardiac Cath -  Pacemaker/ICD device last checked:  Sleep Study - Yes CPAP - Yes  Fasting Blood Sugar -  Checks Blood Sugar _____ times a day  Blood Thinner Instructions: Aspirin Instructions: Last Dose:  Anesthesia review: Hx: Pre-DIA,OSA(CPAP)  Patient denies shortness of breath, fever, cough and chest pain at PAT appointment   Patient verbalized understanding of instructions that were given to them at the PAT appointment. Patient was also instructed that they will need to review over the PAT instructions again at home before surgery.COVID Vaccine Completed: Date COVID Vaccine completed: COVID vaccine manufacturer: [place]   PCP -  Maximiano Coss: NP Cardiologist - Dr. Lyman Bishop  Chest x-ray -  EKG -  Stress Test -  ECHO -  Cardiac Cath -  Pacemaker/ICD device last checked:  Sleep Study -  CPAP -   Fasting Blood Sugar -  Checks Blood Sugar _____ times a day  Blood Thinner Instructions: Aspirin Instructions: Last Dose:  Anesthesia review: Hx: DIA,OSA  Patient denies shortness of breath, fever, cough and chest pain at PAT appointment   Patient verbalized understanding of instructions that were given to them at the PAT appointment. Patient was also instructed that they will need to review over the PAT instructions again at home before surgery.

## 2020-05-25 NOTE — Progress Notes (Signed)
Lab results: Cr: 1.70.

## 2020-05-26 ENCOUNTER — Encounter: Payer: Self-pay | Admitting: *Deleted

## 2020-05-28 ENCOUNTER — Other Ambulatory Visit (HOSPITAL_COMMUNITY)
Admission: RE | Admit: 2020-05-28 | Discharge: 2020-05-28 | Disposition: A | Discharge status: Home / Self Care | Payer: BC Managed Care – PPO | Source: Ambulatory Visit | Attending: General Surgery | Admitting: General Surgery

## 2020-05-28 DIAGNOSIS — Z01812 Encounter for preprocedural laboratory examination: Secondary | ICD-10-CM | POA: Insufficient documentation

## 2020-05-28 DIAGNOSIS — Z20822 Contact with and (suspected) exposure to covid-19: Secondary | ICD-10-CM | POA: Insufficient documentation

## 2020-05-28 LAB — SARS CORONAVIRUS 2 (TAT 6-24 HRS): SARS Coronavirus 2: NEGATIVE

## 2020-05-30 MED ORDER — BUPIVACAINE LIPOSOME 1.3 % IJ SUSP
20.0000 mL | Freq: Once | INTRAMUSCULAR | Status: DC
  Filled 2020-05-30: qty 20

## 2020-05-31 ENCOUNTER — Other Ambulatory Visit: Payer: Self-pay

## 2020-05-31 ENCOUNTER — Inpatient Hospital Stay (HOSPITAL_COMMUNITY): Payer: BC Managed Care – PPO | Admitting: Registered Nurse

## 2020-05-31 ENCOUNTER — Encounter (HOSPITAL_COMMUNITY): Payer: Self-pay | Admitting: General Surgery

## 2020-05-31 ENCOUNTER — Inpatient Hospital Stay (HOSPITAL_COMMUNITY)
Admission: RE | Admit: 2020-05-31 | Discharge: 2020-06-02 | DRG: 621 | Disposition: A | Discharge status: Home / Self Care | Payer: BC Managed Care – PPO | Attending: General Surgery | Admitting: General Surgery

## 2020-05-31 ENCOUNTER — Encounter (HOSPITAL_COMMUNITY)
Admission: RE | Disposition: A | Discharge status: Home / Self Care | Payer: Self-pay | Source: Home / Self Care | Attending: General Surgery

## 2020-05-31 DIAGNOSIS — Z6841 Body Mass Index (BMI) 40.0 and over, adult: Secondary | ICD-10-CM

## 2020-05-31 DIAGNOSIS — K760 Fatty (change of) liver, not elsewhere classified: Secondary | ICD-10-CM | POA: Diagnosis present

## 2020-05-31 DIAGNOSIS — Z884 Allergy status to anesthetic agent status: Secondary | ICD-10-CM | POA: Diagnosis not present

## 2020-05-31 DIAGNOSIS — K219 Gastro-esophageal reflux disease without esophagitis: Secondary | ICD-10-CM | POA: Diagnosis present

## 2020-05-31 DIAGNOSIS — Z886 Allergy status to analgesic agent status: Secondary | ICD-10-CM | POA: Diagnosis not present

## 2020-05-31 DIAGNOSIS — Z882 Allergy status to sulfonamides status: Secondary | ICD-10-CM

## 2020-05-31 DIAGNOSIS — Z8616 Personal history of COVID-19: Secondary | ICD-10-CM

## 2020-05-31 DIAGNOSIS — E0590 Thyrotoxicosis, unspecified without thyrotoxic crisis or storm: Secondary | ICD-10-CM | POA: Diagnosis present

## 2020-05-31 DIAGNOSIS — Z79899 Other long term (current) drug therapy: Secondary | ICD-10-CM

## 2020-05-31 DIAGNOSIS — E785 Hyperlipidemia, unspecified: Secondary | ICD-10-CM | POA: Diagnosis present

## 2020-05-31 DIAGNOSIS — Z83438 Family history of other disorder of lipoprotein metabolism and other lipidemia: Secondary | ICD-10-CM | POA: Diagnosis not present

## 2020-05-31 DIAGNOSIS — G4730 Sleep apnea, unspecified: Secondary | ICD-10-CM | POA: Diagnosis present

## 2020-05-31 DIAGNOSIS — Z79890 Hormone replacement therapy (postmenopausal): Secondary | ICD-10-CM

## 2020-05-31 DIAGNOSIS — R7303 Prediabetes: Secondary | ICD-10-CM | POA: Diagnosis present

## 2020-05-31 DIAGNOSIS — M1990 Unspecified osteoarthritis, unspecified site: Secondary | ICD-10-CM | POA: Diagnosis present

## 2020-05-31 DIAGNOSIS — Z91018 Allergy to other foods: Secondary | ICD-10-CM

## 2020-05-31 DIAGNOSIS — F32A Depression, unspecified: Secondary | ICD-10-CM | POA: Diagnosis present

## 2020-05-31 DIAGNOSIS — Z7984 Long term (current) use of oral hypoglycemic drugs: Secondary | ICD-10-CM | POA: Diagnosis not present

## 2020-05-31 DIAGNOSIS — Z888 Allergy status to other drugs, medicaments and biological substances status: Secondary | ICD-10-CM

## 2020-05-31 DIAGNOSIS — G43909 Migraine, unspecified, not intractable, without status migrainosus: Secondary | ICD-10-CM | POA: Diagnosis present

## 2020-05-31 DIAGNOSIS — Z833 Family history of diabetes mellitus: Secondary | ICD-10-CM

## 2020-05-31 DIAGNOSIS — I951 Orthostatic hypotension: Secondary | ICD-10-CM | POA: Diagnosis not present

## 2020-05-31 DIAGNOSIS — Z20822 Contact with and (suspected) exposure to covid-19: Secondary | ICD-10-CM | POA: Diagnosis present

## 2020-05-31 DIAGNOSIS — F419 Anxiety disorder, unspecified: Secondary | ICD-10-CM | POA: Diagnosis present

## 2020-05-31 HISTORY — PX: UPPER GI ENDOSCOPY: SHX6162

## 2020-05-31 HISTORY — PX: LAPAROSCOPIC GASTRIC SLEEVE RESECTION: SHX5895

## 2020-05-31 LAB — TYPE AND SCREEN
ABO/RH(D): A NEG
Antibody Screen: NEGATIVE

## 2020-05-31 LAB — HCG, SERUM, QUALITATIVE: Preg, Serum: NEGATIVE

## 2020-05-31 LAB — GLUCOSE, CAPILLARY
Glucose-Capillary: 103 mg/dL — ABNORMAL HIGH (ref 70–99)
Glucose-Capillary: 142 mg/dL — ABNORMAL HIGH (ref 70–99)
Glucose-Capillary: 197 mg/dL — ABNORMAL HIGH (ref 70–99)

## 2020-05-31 LAB — HEMOGLOBIN AND HEMATOCRIT, BLOOD
HCT: 37 % (ref 36.0–46.0)
Hemoglobin: 12.5 g/dL (ref 12.0–15.0)

## 2020-05-31 SURGERY — GASTRECTOMY, SLEEVE, LAPAROSCOPIC
Anesthesia: General | Site: Esophagus

## 2020-05-31 MED ORDER — EPHEDRINE 5 MG/ML INJ
INTRAVENOUS | Status: AC
  Filled 2020-05-31: qty 10

## 2020-05-31 MED ORDER — PROMETHAZINE HCL 25 MG/ML IJ SOLN: 6.2500 mg | INTRAMUSCULAR | Status: DC | PRN

## 2020-05-31 MED ORDER — LORAZEPAM 1 MG PO TABS: 1.0000 mg | ORAL_TABLET | Freq: Two times a day (BID) | ORAL | Status: DC | PRN

## 2020-05-31 MED ORDER — EPHEDRINE SULFATE-NACL 50-0.9 MG/10ML-% IV SOSY
PREFILLED_SYRINGE | INTRAVENOUS | Status: DC | PRN
  Administered 2020-05-31 (×2): 10 mg via INTRAVENOUS

## 2020-05-31 MED ORDER — DEXAMETHASONE SODIUM PHOSPHATE 10 MG/ML IJ SOLN
INTRAMUSCULAR | Status: AC
  Filled 2020-05-31: qty 1

## 2020-05-31 MED ORDER — MIDAZOLAM HCL 5 MG/5ML IJ SOLN
INTRAMUSCULAR | Status: DC | PRN
  Administered 2020-05-31: 2 mg via INTRAVENOUS

## 2020-05-31 MED ORDER — OXYCODONE HCL 5 MG/5ML PO SOLN
5.0000 mg | Freq: Four times a day (QID) | ORAL | Status: DC | PRN
  Administered 2020-05-31 – 2020-06-01 (×2): 5 mg via ORAL
  Filled 2020-05-31 (×2): qty 5

## 2020-05-31 MED ORDER — FENTANYL CITRATE (PF) 100 MCG/2ML IJ SOLN
INTRAMUSCULAR | Status: AC
  Filled 2020-05-31: qty 2

## 2020-05-31 MED ORDER — AMISULPRIDE (ANTIEMETIC) 5 MG/2ML IV SOLN
10.0000 mg | Freq: Once | INTRAVENOUS | Status: AC | PRN
  Administered 2020-05-31: 10 mg via INTRAVENOUS

## 2020-05-31 MED ORDER — ROCURONIUM BROMIDE 10 MG/ML (PF) SYRINGE
PREFILLED_SYRINGE | INTRAVENOUS | Status: AC
  Filled 2020-05-31: qty 10

## 2020-05-31 MED ORDER — DEXAMETHASONE SODIUM PHOSPHATE 10 MG/ML IJ SOLN
INTRAMUSCULAR | Status: DC | PRN
  Administered 2020-05-31: 8 mg via INTRAVENOUS

## 2020-05-31 MED ORDER — PHENYLEPHRINE 40 MCG/ML (10ML) SYRINGE FOR IV PUSH (FOR BLOOD PRESSURE SUPPORT)
PREFILLED_SYRINGE | INTRAVENOUS | Status: AC
  Filled 2020-05-31: qty 10

## 2020-05-31 MED ORDER — BUPIVACAINE LIPOSOME 1.3 % IJ SUSP
INTRAMUSCULAR | Status: DC | PRN
  Administered 2020-05-31: 20 mL

## 2020-05-31 MED ORDER — TOPIRAMATE 100 MG PO TABS
100.0000 mg | ORAL_TABLET | Freq: Every day | ORAL | Status: DC
  Administered 2020-05-31 – 2020-06-01 (×2): 100 mg via ORAL
  Filled 2020-05-31 (×2): qty 1

## 2020-05-31 MED ORDER — ACETAMINOPHEN 500 MG PO TABS
1000.0000 mg | ORAL_TABLET | ORAL | Status: AC
  Administered 2020-05-31: 1000 mg via ORAL
  Filled 2020-05-31: qty 2

## 2020-05-31 MED ORDER — LORATADINE 10 MG PO TABS
10.0000 mg | ORAL_TABLET | Freq: Every day | ORAL | Status: DC
  Administered 2020-06-01 – 2020-06-02 (×2): 10 mg via ORAL
  Filled 2020-05-31 (×2): qty 1

## 2020-05-31 MED ORDER — ONDANSETRON HCL 4 MG/2ML IJ SOLN
INTRAMUSCULAR | Status: AC
  Filled 2020-05-31: qty 2

## 2020-05-31 MED ORDER — MIDAZOLAM HCL 2 MG/2ML IJ SOLN
INTRAMUSCULAR | Status: AC
  Filled 2020-05-31: qty 2

## 2020-05-31 MED ORDER — FENTANYL CITRATE (PF) 100 MCG/2ML IJ SOLN
INTRAMUSCULAR | Status: DC | PRN
  Administered 2020-05-31 (×2): 50 ug via INTRAVENOUS
  Administered 2020-05-31 (×2): 25 ug via INTRAVENOUS
  Administered 2020-05-31: 50 ug via INTRAVENOUS

## 2020-05-31 MED ORDER — PHENYLEPHRINE 40 MCG/ML (10ML) SYRINGE FOR IV PUSH (FOR BLOOD PRESSURE SUPPORT)
PREFILLED_SYRINGE | INTRAVENOUS | Status: DC | PRN
  Administered 2020-05-31: 80 ug via INTRAVENOUS

## 2020-05-31 MED ORDER — LACTATED RINGERS IV SOLN: INTRAVENOUS | Status: DC

## 2020-05-31 MED ORDER — SUGAMMADEX SODIUM 500 MG/5ML IV SOLN
INTRAVENOUS | Status: DC | PRN
  Administered 2020-05-31: 300 mg via INTRAVENOUS

## 2020-05-31 MED ORDER — CHLORHEXIDINE GLUCONATE CLOTH 2 % EX PADS: 6.0000 | MEDICATED_PAD | Freq: Once | CUTANEOUS | Status: DC

## 2020-05-31 MED ORDER — SIMETHICONE 80 MG PO CHEW
80.0000 mg | CHEWABLE_TABLET | Freq: Four times a day (QID) | ORAL | Status: DC | PRN
  Filled 2020-05-31 (×2): qty 1

## 2020-05-31 MED ORDER — LACTATED RINGERS IR SOLN
Status: DC | PRN
  Administered 2020-05-31: 1000 mL

## 2020-05-31 MED ORDER — FENTANYL CITRATE (PF) 100 MCG/2ML IJ SOLN
25.0000 ug | INTRAMUSCULAR | Status: DC | PRN
  Administered 2020-05-31 (×2): 50 ug via INTRAVENOUS

## 2020-05-31 MED ORDER — HEPARIN SODIUM (PORCINE) 5000 UNIT/ML IJ SOLN
5000.0000 [IU] | INTRAMUSCULAR | Status: AC
  Administered 2020-05-31: 5000 [IU] via SUBCUTANEOUS
  Filled 2020-05-31: qty 1

## 2020-05-31 MED ORDER — DEXAMETHASONE SODIUM PHOSPHATE 4 MG/ML IJ SOLN: 4.0000 mg | INTRAMUSCULAR | Status: DC

## 2020-05-31 MED ORDER — ACETAMINOPHEN 160 MG/5ML PO SOLN: 1000.0000 mg | Freq: Three times a day (TID) | ORAL | Status: DC

## 2020-05-31 MED ORDER — OXYCODONE HCL 5 MG PO TABS: 5.0000 mg | ORAL_TABLET | Freq: Once | ORAL | Status: DC | PRN

## 2020-05-31 MED ORDER — SCOPOLAMINE 1 MG/3DAYS TD PT72
1.0000 | MEDICATED_PATCH | TRANSDERMAL | Status: DC
  Administered 2020-05-31: 1.5 mg via TRANSDERMAL
  Filled 2020-05-31: qty 1

## 2020-05-31 MED ORDER — DEXTROSE-NACL 5-0.45 % IV SOLN: INTRAVENOUS | Status: DC

## 2020-05-31 MED ORDER — PROPOFOL 10 MG/ML IV BOLUS
INTRAVENOUS | Status: AC
  Filled 2020-05-31: qty 40

## 2020-05-31 MED ORDER — ENSURE MAX PROTEIN PO LIQD
2.0000 [oz_av] | ORAL | Status: DC
  Administered 2020-06-01 – 2020-06-02 (×10): 2 [oz_av] via ORAL

## 2020-05-31 MED ORDER — ENOXAPARIN SODIUM 30 MG/0.3ML ~~LOC~~ SOLN
30.0000 mg | Freq: Two times a day (BID) | SUBCUTANEOUS | Status: DC
  Administered 2020-05-31 – 2020-06-02 (×4): 30 mg via SUBCUTANEOUS
  Filled 2020-05-31 (×4): qty 0.3

## 2020-05-31 MED ORDER — AMISULPRIDE (ANTIEMETIC) 5 MG/2ML IV SOLN
INTRAVENOUS | Status: AC
  Filled 2020-05-31: qty 4

## 2020-05-31 MED ORDER — ACETAMINOPHEN 500 MG PO TABS
1000.0000 mg | ORAL_TABLET | Freq: Three times a day (TID) | ORAL | Status: DC
  Administered 2020-05-31 – 2020-06-02 (×5): 1000 mg via ORAL
  Filled 2020-05-31 (×6): qty 2

## 2020-05-31 MED ORDER — BUSPIRONE HCL 5 MG PO TABS
15.0000 mg | ORAL_TABLET | Freq: Two times a day (BID) | ORAL | Status: DC
  Administered 2020-05-31 – 2020-06-02 (×4): 15 mg via ORAL
  Filled 2020-05-31 (×4): qty 3

## 2020-05-31 MED ORDER — CITALOPRAM HYDROBROMIDE 20 MG PO TABS
40.0000 mg | ORAL_TABLET | Freq: Every day | ORAL | Status: DC
  Administered 2020-06-01 – 2020-06-02 (×2): 40 mg via ORAL
  Filled 2020-05-31 (×2): qty 2

## 2020-05-31 MED ORDER — SODIUM CHLORIDE 0.9 % IV SOLN
2.0000 g | INTRAVENOUS | Status: AC
  Administered 2020-05-31: 2 g via INTRAVENOUS
  Filled 2020-05-31: qty 2

## 2020-05-31 MED ORDER — GABAPENTIN 300 MG PO CAPS
300.0000 mg | ORAL_CAPSULE | ORAL | Status: AC
  Administered 2020-05-31: 300 mg via ORAL
  Filled 2020-05-31: qty 1

## 2020-05-31 MED ORDER — PROPOFOL 10 MG/ML IV BOLUS
INTRAVENOUS | Status: DC | PRN
  Administered 2020-05-31: 200 mg via INTRAVENOUS

## 2020-05-31 MED ORDER — OXYCODONE HCL 5 MG/5ML PO SOLN: 5.0000 mg | Freq: Once | ORAL | Status: DC | PRN

## 2020-05-31 MED ORDER — CHLORHEXIDINE GLUCONATE 0.12 % MT SOLN
15.0000 mL | Freq: Once | OROMUCOSAL | Status: AC
  Administered 2020-05-31: 15 mL via OROMUCOSAL

## 2020-05-31 MED ORDER — INSULIN ASPART 100 UNIT/ML ~~LOC~~ SOLN
0.0000 [IU] | SUBCUTANEOUS | Status: DC
  Administered 2020-05-31: 4 [IU] via SUBCUTANEOUS
  Administered 2020-05-31 – 2020-06-01 (×3): 3 [IU] via SUBCUTANEOUS
  Administered 2020-06-01: 4 [IU] via SUBCUTANEOUS
  Administered 2020-06-01: 3 [IU] via SUBCUTANEOUS
  Administered 2020-06-01: 4 [IU] via SUBCUTANEOUS
  Administered 2020-06-02 (×2): 3 [IU] via SUBCUTANEOUS
  Administered 2020-06-02: 4 [IU] via SUBCUTANEOUS

## 2020-05-31 MED ORDER — HYDRALAZINE HCL 25 MG PO TABS: 25.0000 mg | ORAL_TABLET | Freq: Three times a day (TID) | ORAL | Status: DC | PRN

## 2020-05-31 MED ORDER — ROCURONIUM BROMIDE 10 MG/ML (PF) SYRINGE
PREFILLED_SYRINGE | INTRAVENOUS | Status: DC | PRN
  Administered 2020-05-31: 60 mg via INTRAVENOUS

## 2020-05-31 MED ORDER — ORAL CARE MOUTH RINSE: 15.0000 mL | Freq: Once | OROMUCOSAL | Status: AC

## 2020-05-31 MED ORDER — BUPIVACAINE HCL 0.25 % IJ SOLN
INTRAMUSCULAR | Status: AC
  Filled 2020-05-31: qty 1

## 2020-05-31 MED ORDER — ONDANSETRON HCL 4 MG/2ML IJ SOLN
4.0000 mg | INTRAMUSCULAR | Status: DC | PRN
  Administered 2020-05-31: 4 mg via INTRAVENOUS
  Filled 2020-05-31: qty 2

## 2020-05-31 MED ORDER — APREPITANT 40 MG PO CAPS
40.0000 mg | ORAL_CAPSULE | ORAL | Status: AC
  Administered 2020-05-31: 40 mg via ORAL
  Filled 2020-05-31: qty 1

## 2020-05-31 MED ORDER — BUPROPION HCL ER (SR) 150 MG PO TB12
150.0000 mg | ORAL_TABLET | Freq: Two times a day (BID) | ORAL | Status: DC
  Administered 2020-05-31 – 2020-06-02 (×4): 150 mg via ORAL
  Filled 2020-05-31 (×4): qty 1

## 2020-05-31 MED ORDER — FAMOTIDINE IN NACL 20-0.9 MG/50ML-% IV SOLN
20.0000 mg | Freq: Two times a day (BID) | INTRAVENOUS | Status: DC
  Administered 2020-05-31 – 2020-06-01 (×3): 20 mg via INTRAVENOUS
  Filled 2020-05-31 (×4): qty 50

## 2020-05-31 MED ORDER — 0.9 % SODIUM CHLORIDE (POUR BTL) OPTIME
TOPICAL | Status: DC | PRN
  Administered 2020-05-31: 1000 mL

## 2020-05-31 MED ORDER — BUPIVACAINE HCL 0.25 % IJ SOLN
INTRAMUSCULAR | Status: DC | PRN
  Administered 2020-05-31: 30 mL

## 2020-05-31 MED ORDER — ONDANSETRON HCL 4 MG/2ML IJ SOLN
INTRAMUSCULAR | Status: DC | PRN
  Administered 2020-05-31: 4 mg via INTRAVENOUS

## 2020-05-31 MED ORDER — MORPHINE SULFATE (PF) 2 MG/ML IV SOLN: 1.0000 mg | INTRAVENOUS | Status: DC | PRN

## 2020-05-31 SURGICAL SUPPLY — 52 items
APPLIER CLIP ROT 13.4 12 LRG (CLIP)
BAG LAPAROSCOPIC 12 15 PORT 16 (BASKET) ×2 IMPLANT
BAG RETRIEVAL 12/15 (BASKET) ×3
BENZOIN TINCTURE PRP APPL 2/3 (GAUZE/BANDAGES/DRESSINGS) ×3 IMPLANT
BLADE SURG SZ11 CARB STEEL (BLADE) ×3 IMPLANT
BNDG ADH 1X3 SHEER STRL LF (GAUZE/BANDAGES/DRESSINGS) ×3 IMPLANT
CABLE HIGH FREQUENCY MONO STRZ (ELECTRODE) IMPLANT
CHLORAPREP W/TINT 26 (MISCELLANEOUS) ×3 IMPLANT
CLIP APPLIE ROT 13.4 12 LRG (CLIP) IMPLANT
COVER SURGICAL LIGHT HANDLE (MISCELLANEOUS) ×3 IMPLANT
COVER WAND RF STERILE (DRAPES) IMPLANT
DECANTER SPIKE VIAL GLASS SM (MISCELLANEOUS) ×3 IMPLANT
DRAPE UTILITY XL STRL (DRAPES) ×6 IMPLANT
ELECT REM PT RETURN 15FT ADLT (MISCELLANEOUS) ×3 IMPLANT
GAUZE 4X4 16PLY RFD (DISPOSABLE) ×3 IMPLANT
GLOVE SURG POLYISO LF SZ7 (GLOVE) ×3 IMPLANT
GLOVE SURG UNDER POLY LF SZ7 (GLOVE) ×3 IMPLANT
GOWN STRL REUS W/TWL LRG LVL3 (GOWN DISPOSABLE) ×3 IMPLANT
GOWN STRL REUS W/TWL XL LVL3 (GOWN DISPOSABLE) ×9 IMPLANT
GRASPER SUT TROCAR 14GX15 (MISCELLANEOUS) ×3 IMPLANT
KIT BASIN OR (CUSTOM PROCEDURE TRAY) ×3 IMPLANT
KIT TURNOVER KIT A (KITS) ×3 IMPLANT
MARKER SKIN DUAL TIP RULER LAB (MISCELLANEOUS) ×3 IMPLANT
MAT PREVALON FULL STRYKER (MISCELLANEOUS) IMPLANT
NEEDLE SPNL 22GX3.5 QUINCKE BK (NEEDLE) ×3 IMPLANT
PACK UNIVERSAL I (CUSTOM PROCEDURE TRAY) ×3 IMPLANT
RELOAD STAPLER BLUE 60MM (STAPLE) ×10 IMPLANT
RELOAD STAPLER GOLD 60MM (STAPLE) ×2 IMPLANT
RELOAD STAPLER GREEN 60MM (STAPLE) ×2 IMPLANT
SCISSORS LAP 5X45 EPIX DISP (ENDOMECHANICALS) IMPLANT
SET IRRIG TUBING LAPAROSCOPIC (IRRIGATION / IRRIGATOR) ×3 IMPLANT
SET TUBE SMOKE EVAC HIGH FLOW (TUBING) ×3 IMPLANT
SHEARS HARMONIC ACE PLUS 45CM (MISCELLANEOUS) ×3 IMPLANT
SLEEVE GASTRECTOMY 40FR VISIGI (MISCELLANEOUS) ×3 IMPLANT
SLEEVE XCEL OPT CAN 5 100 (ENDOMECHANICALS) ×6 IMPLANT
SOL ANTI FOG 6CC (MISCELLANEOUS) ×2 IMPLANT
SOLUTION ANTI FOG 6CC (MISCELLANEOUS) ×1
STAPLER ECHELON LONG 60 440 (INSTRUMENTS) ×3 IMPLANT
STAPLER RELOAD BLUE 60MM (STAPLE) ×15
STAPLER RELOAD GOLD 60MM (STAPLE) ×3
STAPLER RELOAD GREEN 60MM (STAPLE) ×3
STRIP CLOSURE SKIN 1/2X4 (GAUZE/BANDAGES/DRESSINGS) ×3 IMPLANT
SUT ETHIBOND 0 36 GRN (SUTURE) IMPLANT
SUT MNCRL AB 4-0 PS2 18 (SUTURE) ×3 IMPLANT
SUT VICRYL 0 TIES 12 18 (SUTURE) ×3 IMPLANT
SYR 20ML LL LF (SYRINGE) ×3 IMPLANT
SYR 50ML LL SCALE MARK (SYRINGE) ×3 IMPLANT
TOWEL OR 17X26 10 PK STRL BLUE (TOWEL DISPOSABLE) ×3 IMPLANT
TOWEL OR NON WOVEN STRL DISP B (DISPOSABLE) ×3 IMPLANT
TROCAR BLADELESS 15MM (ENDOMECHANICALS) ×3 IMPLANT
TROCAR BLADELESS OPT 5 100 (ENDOMECHANICALS) ×3 IMPLANT
TUBING CONNECTING 10 (TUBING) ×6 IMPLANT

## 2020-05-31 NOTE — Progress Notes (Signed)
Discussed post op day goals with patient including ambulation, IS, diet progression, pain, and nausea control.  BSTOP education provided including BSTOP information guide, "Guide for Pain Management after your Bariatric Procedure".  Questions answered. 

## 2020-05-31 NOTE — Anesthesia Postprocedure Evaluation (Signed)
Anesthesia Post Note  Patient: Melanie [name]  Procedure(s) Performed: LAPAROSCOPIC GASTRIC SLEEVE RESECTION (N/A Abdomen) UPPER GI ENDOSCOPY (N/A Esophagus)     Patient location during evaluation: PACU Anesthesia Type: General Level of consciousness: awake Pain management: pain level controlled Vital Signs Assessment: post-procedure vital signs reviewed and stable Respiratory status: spontaneous breathing, nonlabored ventilation, respiratory function stable and patient connected to nasal cannula oxygen Cardiovascular status: blood pressure returned to baseline and stable Postop Assessment: no apparent nausea or vomiting Anesthetic complications: no   No complications documented.  Last Vitals:  Vitals:   05/31/20 1600 05/31/20 1622  BP: 114/65 124/70  Pulse: (!) 58 (!) 50  Resp: 14 14  Temp:    SpO2: 95% 100%    Last Pain:  Vitals:   05/31/20 1654  TempSrc:   PainSc: 4                  Crystina Borrayo P Arlenis Blaydes

## 2020-05-31 NOTE — Anesthesia Procedure Notes (Signed)
Procedure Name: Intubation Date/Time: 05/31/2020 11:34 AM Performed by: Victoriano Lain, CRNA Pre-anesthesia Checklist: Patient identified, Emergency Drugs available, Suction available, Patient being monitored and Timeout performed Patient Re-evaluated:Patient Re-evaluated prior to induction Oxygen Delivery Method: Circle system utilized Preoxygenation: Pre-oxygenation with 100% oxygen Induction Type: IV induction Ventilation: Mask ventilation without difficulty Laryngoscope Size: Glidescope and 3 Grade View: Grade I Tube type: Oral Tube size: 7.5 mm Number of attempts: 2 Airway Equipment and Method: Stylet and Video-laryngoscopy Placement Confirmation: ETT inserted through vocal cords under direct vision,  positive ETCO2 and breath sounds checked- equal and bilateral Secured at: 22 cm Tube secured with: Tape Dental Injury: Teeth and Oropharynx as per pre-operative assessment  Difficulty Due To: Difficult Airway- due to dentition Comments: Smooth IV induction, easy mask. DL X 1 with MAC 4. Grade 2-3 view. - ETCO2. ETT removed. Easy mask. DL X 1 with Glidescope #3. Grade 1 view. ATOI. + ETCO2. BBS=. Front teeth noted to be prominent

## 2020-05-31 NOTE — H&P (Signed)
Melanie [name] is an 45 y.o. female.   Chief Complaint: obesity HPI: 45 yo female with long history of obesity and multiple comorbid conditions. She has completed all requirements and presents for bariatric surgery.  Past Medical History:  Diagnosis Date  . Allergy   . Anemia   . Anxiety   . Arthritis   . Back pain   . Constipation   . COVID-19 05/27/2019  . Depression   . Fatty liver   . GERD (gastroesophageal reflux disease)   . Hyperlipidemia   . Hyperthyroidism   . Infertility, female   . Joint pain   . Kidney problem   . 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 Migraines    LIGHT SENSITIVITY , NOW ON IMITREX SINCE 08-02-2018,NOTICES IMPROVEMENT   . Multiple food allergies   . Muscle pain   . Numbness and tingling in both hands   . Pre-diabetes   . Shortness of breath    was due to a medication side effect, now off medication , SOB resolved   . Sleep apnea    CPAP  . Stress   . Swelling of both lower extremities     Past Surgical History:  Procedure Laterality Date  . ELBOW LIGAMENT RECONSTRUCTION Right 08/21/2018   Procedure: LATERAL COLLATERAL LIGAMENT REPAIR;  Surgeon: Hiram Gash, MD;  Location: [facility];  Service: Orthopedics;  Laterality: Right;  . FINGER SURGERY Left     index finger  . KNEE SURGERY Left   . LIVER BIOPSY N/A 05/15/2014   Procedure: LIVER BIOPSY;  Surgeon: Inda Castle, MD;  Location: [facility];  Service: Endoscopy;  Laterality: N/A;  ultrasound to mark the liver  . TENNIS ELBOW RELEASE/NIRSCHEL PROCEDURE Right 08/21/2018   Procedure: TENNIS ELBOW RELEASE/NIRSCHEL PROCEDURE;  Surgeon: Hiram Gash, MD;  Location: [facility];  Service: Orthopedics;  Laterality: Right;  . TONSILLECTOMY AND ADENOIDECTOMY  1978  . TYMPANOSTOMY TUBE PLACEMENT    . ULNAR NERVE TRANSPOSITION Right 08/21/2018   Procedure: ULNAR NERVE DECOMPRESSION/TRANSPOSITION;  Surgeon: Hiram Gash, MD;  Location: [facility];  Service: Orthopedics;  Laterality: Right;   . wrist cyst Left     Family History  Problem Relation Age of Onset  . Diabetes Mother   . Liver disease Mother        ESLD- unknown etiology  . Obesity Mother   . Kidney disease Mother   . Diabetes Maternal Grandmother   . Diabetes Paternal Grandmother   . Breast cancer Paternal Grandmother   . Stroke Maternal Grandfather   . Hyperlipidemia Father    Social History:  reports that she has never smoked. She has never used smokeless tobacco. She reports that she does not drink alcohol and does not use drugs.  Allergies:  Allergies  Allergen Reactions  . Welchol [Colesevelam] Shortness Of Breath  . Lidocaine Other (See Comments)    Flu like sx's  . Pistachio Nut (Diagnostic) Itching and Swelling    Swollen tongue/itchy mouth  . Relafen [Nabumetone] Other (See Comments)    migraine  . Celebrex [Celecoxib] Rash  . Sulfa Antibiotics Rash    Medications Prior to Admission  Medication Sig Dispense Refill  . Alirocumab (PRALUENT) 150 MG/ML SOAJ Inject 1 Dose into the skin every 14 (fourteen) days. (Patient taking differently: Inject 150 mg into the skin every 14 (fourteen) days.) 2 pen 11  . buPROPion (WELLBUTRIN SR) 150 MG 12 hr tablet Take 1 tablet (150 mg  total) by mouth 2 (two) times daily. 180 tablet 0  . busPIRone (BUSPAR) 15 MG tablet Take 1 tablet (15 mg total) by mouth 2 (two) times daily. 180 tablet 3  . cetirizine (ZYRTEC) 10 MG tablet Take 10 mg by mouth at bedtime.     . chlorthalidone (HYGROTON) 25 MG tablet Take 1 tablet (25 mg total) by mouth daily. 90 tablet 0  . cholecalciferol (VITAMIN D) 25 MCG (1000 UNIT) tablet Take 4,000 Units by mouth daily.    . citalopram (CELEXA) 40 MG tablet Take 1 tablet (40 mg total) by mouth daily. 90 tablet 0  . fluticasone (FLONASE) 50 MCG/ACT nasal spray Place 2 sprays into both nostrils daily. 16 g 3  . levothyroxine (SYNTHROID) 88 MCG tablet Take 1 tablet (88 mcg total) by mouth daily before breakfast. 90 tablet 0  . LORazepam  (ATIVAN) 1 MG tablet Take 1 tablet (1 mg total) by mouth 2 (two) times daily as needed for anxiety. 20 tablet 0  . metFORMIN (GLUCOPHAGE) 500 MG tablet Take 1 tablet (500 mg total) by mouth 2 (two) times daily. 180 tablet 0  . montelukast (SINGULAIR) 10 MG tablet Take 1 tablet (10 mg total) by mouth at bedtime. 90 tablet 3  . Multiple Vitamin (MULTIVITAMIN WITH MINERALS) TABS tablet Take 1 tablet by mouth daily.    . Omega-3 Fatty Acids (FISH OIL) 1200 MG CAPS Take 1,200 mg by mouth in the morning and at bedtime.    Marland Kitchen omeprazole (PRILOSEC) 20 MG capsule Take 1 capsule (20 mg total) by mouth daily. 90 capsule 0  . Pitavastatin Calcium 2 MG TABS Take 1 tablet (2 mg total) by mouth daily. 30 tablet 11  . Polyethyl Glycol-Propyl Glycol (LUBRICANT EYE DROPS) 0.4-0.3 % SOLN Place 1 drop into both eyes 3 (three) times daily as needed (dry/irritated eyes).    . topiramate (TOPAMAX) 100 MG tablet Take 1 tablet (100 mg total) by mouth at bedtime. 90 tablet 0  . Vitamin D, Ergocalciferol, (DRISDOL) 1.25 MG (50000 UNIT) CAPS capsule Take 1 capsule (50,000 Units total) by mouth every 7 (seven) days. One tab every 7 days 12 capsule 3  . Insulin Pen Needle (BD PEN NEEDLE NANO 2ND GEN) 32G X 4 MM MISC 1 Package by Does not apply route daily. 100 each 0  . Liraglutide -Weight Management (SAXENDA) 18 MG/3ML SOPN Inject 2.4 mg into the skin daily. 30 mL 1  . Ubrogepant (UBRELVY) 50 MG TABS Take 50 mg by mouth every 2 (two) hours as needed (may repeat once after 2 hours). 30 tablet 2    Results for orders placed or performed during the hospital encounter of 05/31/20 (from the past 48 hour(s))  Glucose, capillary     Status: Abnormal   Collection Time: 05/31/20  9:57 AM  Result Value Ref Range   Glucose-Capillary 103 (H) 70 - 99 mg/dL    Comment: Glucose reference range applies only to samples taken after fasting for at least 8 hours.   Comment 1 Notify RN    No results found.  Review of Systems   Constitutional: Negative for chills and fever.  HENT: Negative for hearing loss.   Respiratory: Negative for cough.   Cardiovascular: Negative for chest pain and palpitations.  Gastrointestinal: Negative for abdominal pain, nausea and vomiting.  Genitourinary: Negative for dysuria and urgency.  Musculoskeletal: Negative for myalgias and neck pain.  Skin: Negative for rash.  Neurological: Negative for dizziness and headaches.  Hematological: Does not bruise/bleed easily.  Psychiatric/Behavioral: Negative for suicidal ideas.    Blood pressure 131/85, pulse 64, temperature 98.1 F (36.7 C), temperature source Oral, resp. rate 16, height 5' 8"  (1.727 m), weight 131.1 kg, SpO2 95 %. Physical Exam Vitals reviewed.  Constitutional:      Appearance: She is well-developed.  HENT:     Head: Normocephalic and atraumatic.  Eyes:     Conjunctiva/sclera: Conjunctivae normal.     Pupils: Pupils are equal, round, and reactive to light.  Cardiovascular:     Rate and Rhythm: Normal rate and regular rhythm.  Pulmonary:     Effort: Pulmonary effort is normal.     Breath sounds: Normal breath sounds.  Abdominal:     General: Bowel sounds are normal. There is no distension.     Palpations: Abdomen is soft.     Tenderness: There is no abdominal tenderness.  Musculoskeletal:        General: Normal range of motion.     Cervical back: Normal range of motion and neck supple.  Skin:    General: Skin is warm and dry.  Neurological:     Mental Status: She is alert and oriented to person, place, and time.  Psychiatric:        Behavior: Behavior normal.      Assessment/Plan 45 yo female with morbid obesity -lap sleeve gastrectomy -ERAS and bariatric protocols -inpatient admission  Mickeal Skinner, MD 05/31/2020, 10:32 AM

## 2020-05-31 NOTE — Op Note (Signed)
Preop Diagnosis: Obesity Class III  Postop Diagnosis: same  Procedure performed: laparoscopic Sleeve Gastrectomy  Assitant: Kaylyn Lim  Indications:  The patient is a 45 y.o. year-old morbidly obese female who has been followed in the Bariatric Clinic as an outpatient. This patient was diagnosed with morbid obesity with a BMI of Body mass index is 43.95 kg/m. and significant co-morbidities including osteoarthritis and sleep apnea.  The patient was counseled extensively in the Bariatric Outpatient Clinic and after a thorough explanation of the risks and benefits of surgery (including death from complications, bowel leak, infection such as peritonitis and/or sepsis, internal hernia, bleeding, need for blood transfusion, bowel obstruction, organ failure, pulmonary embolus, deep venous thrombosis, wound infection, incisional hernia, skin breakdown, and others entailed on the consent form) and after a compliant diet and exercise program, the patient was scheduled for an elective laparoscopic sleeve gastrectomy.  Description of Operation:  Following informed consent, the patient was taken to the operating room and placed on the operating table in the supine position.  She had previously received prophylactic antibiotics and subcutaneous heparin for DVT prophylaxis in the pre-op holding area.  After induction of general endotracheal anesthesia by the anesthesiologist, the patient underwent placement of sequential compression devices and an oro-gastric tube.  A timeout was confirmed by the surgery and anesthesia teams.  The patient was adequately padded at all pressure points and placed on a footboard to prevent slippage from the OR table during extremes of position during surgery.  She underwent a routine sterile prep and drape of her entire abdomen.    Next, A transverse incision was made under the left subcostal area and a 40m optical viewing trocar was introduced into the peritoneal cavity.  Pneumoperitoneum was applied with a high flow and low pressure. A laparoscope was inserted to confirm placement. A extraperitoneal block was then placed at the lateral abdominal wall using exparel diluted with marcaine. 5 additional incisions were placed: 1 572mtrocar to the left of the midline. 1 additional 78m52mrocar in the left lateral area, 1 42m43mocar in the right mid abdomen, 1 78mm 978mcar in the right subcostal area, and a Nathanson retractor was placed through a subxiphoid incision.  Next, a hole was created through the lesser omentum along the greater curve of the stomach to enter the lesser sac. The vessels along the greater omentum were  Then ligated and divided using the Harmonic scalpel moving towards the spleen and then short gastric vessels were ligated and divided in the same fashion to fully mobilize the fundus. The left crus was identified to ensure completion of the dissection. Next the antrum was measured and dissection continued inferiorly along the greater curve towards the pylorus and stopped 6cm from the pylorus.   A 40Fr ViSiGi dilator was placed into the esophgaus and along the lesser curve of the stomach and placed on suction. 1 non-reinforced 60mm 56mn load echelon stapler(s) followed by 1 60mm G52mload echelon stapler(s) followed by 4 60mm bl49moad echelon stapler(s) were used to make the resection along the antrum being sure to stay well away from the angularis by angling the jaws of the stapler towards the greater curve and later completing the resection staying along the ViSiGi [place] the fundus was not retained by appropriately retracting it lateral. Air was inserted through the ViSiGi [place] a leak test showing no bubbles and a neutral lie of the stomach.  The assistant then went and performed an upper endoscopy and leak test.  No bubbles were seen and the sleeve and antrum distended appropriately. The specimen was then placed in an endocatch bag and removed by  the 1m port. The fascia of the 145mport was closed with a 0 vicryl by suture passer. Hemostasis was ensured. Pneumoperitoneum was evacuated, all ports were removed and all incisions closed with 4-0 monocryl suture in subcuticular fashion. Steristrips and bandaids were put in place for dressing. The patient awoke from anesthesia and was brought to pacu in stable condition. All counts were correct.  Estimated blood loss: <3026mSpecimens:  Sleeve gastrectomy  Local Anesthesia: 50 ml Exparel:0.5% Marcaine mix  Post-Op Plan:       Pain Management: PO, prn      Antibiotics: Prophylactic      Anticoagulation: Prophylactic, Starting now      Post Op Studies/Consults: Not applicable      Intended Discharge: within 48h      Intended Outpatient Follow-Up: Two Week      Intended Outpatient Studies: Not Applicable      Other: Not Applicable  LukArta Brucensinger

## 2020-05-31 NOTE — Progress Notes (Signed)
Patient arrived to unit in wheelchair. Upon attempting to walk to bathroom, patient's knees began to buckle and she stated she thought she was going to faint. She transferred to the bed. Pale in color. B[employer] Public affairs consultant made aware of this observation. Patient started po intake @ 1745. She demonstrated IS use. Patient c/o nausea. Prn given. Pain is 4/10. Sites intact and dry. Vitals stable.

## 2020-05-31 NOTE — Transfer of Care (Signed)
Immediate Anesthesia Transfer of Care Note  Patient: Melanie [name]  Procedure(s) Performed: LAPAROSCOPIC GASTRIC SLEEVE RESECTION (N/A Abdomen) UPPER GI ENDOSCOPY (N/A Esophagus)  Patient Location: PACU  Anesthesia Type:General  Level of Consciousness: awake, alert , oriented and patient cooperative  Airway & Oxygen Therapy: Patient Spontanous Breathing and Patient connected to face mask oxygen  Post-op Assessment: Report given to RN, Post -op Vital signs reviewed and stable and Patient moving all extremities  Post vital signs: Reviewed and stable  Last Vitals:  Vitals Value Taken Time  BP 99/85 05/31/20 1302  Temp    Pulse 72 05/31/20 1303  Resp 20 05/31/20 1303  SpO2 100 % 05/31/20 1303  Vitals shown include unvalidated device data.  Last Pain:  Vitals:   05/31/20 0930  TempSrc:   PainSc: 0-No pain         Complications: No complications documented.

## 2020-05-31 NOTE — Anesthesia Preprocedure Evaluation (Addendum)
Anesthesia Evaluation  Patient identified by MRN, date of birth, ID band Patient awake    Reviewed: Allergy & Precautions, NPO status , Patient's Chart, lab work & pertinent test results  Airway Mallampati: II  TM Distance: >3 FB Neck ROM: Full    Dental no notable dental hx.    Pulmonary sleep apnea and Continuous Positive Airway Pressure Ventilation ,    Pulmonary exam normal breath sounds clear to auscultation       Cardiovascular negative cardio ROS Normal cardiovascular exam Rhythm:Regular Rate:Normal  ECG: NSR   Neuro/Psych  Headaches, PSYCHIATRIC DISORDERS Anxiety Depression    GI/Hepatic GERD  Medicated and Controlled,(+) Hepatitis -  Endo/Other  diabetes (PRE), Oral Hypoglycemic AgentsHypothyroidism Morbid obesity  Renal/GU Renal InsufficiencyRenal disease     Musculoskeletal  (+) Arthritis ,   Abdominal (+) + obese,   Peds  Hematology HLD   Anesthesia Other Findings morbid obesity  Reproductive/Obstetrics                          Anesthesia Physical Anesthesia Plan  ASA: III  Anesthesia Plan: General   Post-op Pain Management:    Induction: Intravenous  PONV Risk Score and Plan: 4 or greater and Ondansetron, Dexamethasone, Midazolam, Scopolamine patch - Pre-op and Treatment may vary due to age or medical condition  Airway Management Planned: Oral ETT  Additional Equipment:   Intra-op Plan:   Post-operative Plan: Extubation in OR  Informed Consent: I have reviewed the patients History and Physical, chart, labs and discussed the procedure including the risks, benefits and alternatives for the proposed anesthesia with the patient or authorized representative who has indicated his/her understanding and acceptance.     Dental advisory given  Plan Discussed with: CRNA  Anesthesia Plan Comments:        Anesthesia Quick Evaluation

## 2020-05-31 NOTE — Progress Notes (Addendum)
PHARMACY CONSULT FOR:  Risk Assessment for Post-Discharge VTE Following Bariatric Surgery  Post-Discharge VTE Risk Assessment: This patient's probability of 30-day post-discharge VTE is increased due to the factors marked:   Female    Age >/=60 years    BMI >/=50 kg/m2    CHF    Dyspnea at Rest    Paraplegia  X  Non-gastric-band surgery    Operation Time >/=3 hr    Return to OR     Length of Stay >/= 3 d   Hx of VTE   Hypercoagulable condition   Significant venous stasis       Predicted probability of 30-day post-discharge VTE: 0.16%  Other patient-specific factors to consider:    Recommendation for Discharge: No pharmacologic prophylaxis post-discharge   Melanie [name] is a 45 y.o. female who underwent  laparoscopic sleeve gastrectomy on 3/21   Case start: 1152 Case end: 1253   Allergies  Allergen Reactions  . Welchol [Colesevelam] Shortness Of Breath  . Lidocaine Other (See Comments)    Flu like sx's  . Pistachio Nut (Diagnostic) Itching and Swelling    Swollen tongue/itchy mouth  . Relafen [Nabumetone] Other (See Comments)    migraine  . Celebrex [Celecoxib] Rash  . Sulfa Antibiotics Rash    Patient Measurements: Height: 5' 8"  (172.7 cm) Weight: 131.1 kg (289 lb 0.4 oz) IBW/kg (Calculated) : 63.9 Body mass index is 43.95 kg/m.  Recent Labs    05/31/20 1325  HGB 12.5  HCT 37.0   Estimated Creatinine Clearance: 59.3 mL/min (A) (by C-G formula based on SCr of 1.7 mg/dL (H)).    Past Medical History:  Diagnosis Date  . Allergy   . Anemia   . Anxiety   . Arthritis   . Back pain   . Constipation   . COVID-19 05/27/2019  . Depression   . Fatty liver   . GERD (gastroesophageal reflux disease)   . Hyperlipidemia   . Hyperthyroidism   . Infertility, female   . Joint pain   . Kidney problem   . 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 Migraines    LIGHT SENSITIVITY , NOW ON IMITREX SINCE 08-02-2018,NOTICES IMPROVEMENT   .  Multiple food allergies   . Muscle pain   . Numbness and tingling in both hands   . Pre-diabetes   . Shortness of breath    was due to a medication side effect, now off medication , SOB resolved   . Sleep apnea    CPAP  . Stress   . Swelling of both lower extremities      Medications Prior to Admission  Medication Sig Dispense Refill Last Dose  . Alirocumab (PRALUENT) 150 MG/ML SOAJ Inject 1 Dose into the skin every 14 (fourteen) days. (Patient taking differently: Inject 150 mg into the skin every 14 (fourteen) days.) 2 pen 11 05/26/2020  . buPROPion (WELLBUTRIN SR) 150 MG 12 hr tablet Take 1 tablet (150 mg total) by mouth 2 (two) times daily. 180 tablet 0 05/31/2020 at 0700  . busPIRone (BUSPAR) 15 MG tablet Take 1 tablet (15 mg total) by mouth 2 (two) times daily. 180 tablet 3 05/31/2020 at 0700  . cetirizine (ZYRTEC) 10 MG tablet Take 10 mg by mouth at bedtime.    05/30/2020 at Unknown time  . chlorthalidone (HYGROTON) 25 MG tablet Take 1 tablet (25 mg total) by mouth daily. 90 tablet 0 05/30/2020 at Unknown time  . cholecalciferol (VITAMIN D)  25 MCG (1000 UNIT) tablet Take 4,000 Units by mouth daily.   05/30/2020 at Unknown time  . citalopram (CELEXA) 40 MG tablet Take 1 tablet (40 mg total) by mouth daily. 90 tablet 0 05/31/2020 at 0700  . fluticasone (FLONASE) 50 MCG/ACT nasal spray Place 2 sprays into both nostrils daily. 16 g 3 Past Month at Unknown time  . levothyroxine (SYNTHROID) 88 MCG tablet Take 1 tablet (88 mcg total) by mouth daily before breakfast. 90 tablet 0 05/31/2020 at 0700  . LORazepam (ATIVAN) 1 MG tablet Take 1 tablet (1 mg total) by mouth 2 (two) times daily as needed for anxiety. 20 tablet 0 Past Month at Unknown time  . metFORMIN (GLUCOPHAGE) 500 MG tablet Take 1 tablet (500 mg total) by mouth 2 (two) times daily. 180 tablet 0 05/30/2020 at Unknown time  . montelukast (SINGULAIR) 10 MG tablet Take 1 tablet (10 mg total) by mouth at bedtime. 90 tablet 3 05/30/2020 at  Unknown time  . Multiple Vitamin (MULTIVITAMIN WITH MINERALS) TABS tablet Take 1 tablet by mouth daily.   05/30/2020 at Unknown time  . Omega-3 Fatty Acids (FISH OIL) 1200 MG CAPS Take 1,200 mg by mouth in the morning and at bedtime.   05/30/2020 at Unknown time  . omeprazole (PRILOSEC) 20 MG capsule Take 1 capsule (20 mg total) by mouth daily. 90 capsule 0 05/31/2020 at 0700  . Pitavastatin Calcium 2 MG TABS Take 1 tablet (2 mg total) by mouth daily. 30 tablet 11 05/30/2020 at Unknown time  . Polyethyl Glycol-Propyl Glycol (LUBRICANT EYE DROPS) 0.4-0.3 % SOLN Place 1 drop into both eyes 3 (three) times daily as needed (dry/irritated eyes).   Past Week at Unknown time  . topiramate (TOPAMAX) 100 MG tablet Take 1 tablet (100 mg total) by mouth at bedtime. 90 tablet 0 05/30/2020 at Unknown time  . Vitamin D, Ergocalciferol, (DRISDOL) 1.25 MG (50000 UNIT) CAPS capsule Take 1 capsule (50,000 Units total) by mouth every 7 (seven) days. One tab every 7 days 12 capsule 3 05/30/2020  . Insulin Pen Needle (BD PEN NEEDLE NANO 2ND GEN) 32G X 4 MM MISC 1 Package by Does not apply route daily. 100 each 0 supply  . Liraglutide -Weight Management (SAXENDA) 18 MG/3ML SOPN Inject 2.4 mg into the skin daily. 30 mL 1 05/27/2020  . Ubrogepant (UBRELVY) 50 MG TABS Take 50 mg by mouth every 2 (two) hours as needed (may repeat once after 2 hours). 30 tablet 2 More than a month at Unknown time       Napoleon Form 05/31/2020,4:40 PM

## 2020-06-01 ENCOUNTER — Encounter (HOSPITAL_COMMUNITY): Payer: Self-pay | Admitting: General Surgery

## 2020-06-01 LAB — GLUCOSE, CAPILLARY
Glucose-Capillary: 123 mg/dL — ABNORMAL HIGH (ref 70–99)
Glucose-Capillary: 124 mg/dL — ABNORMAL HIGH (ref 70–99)
Glucose-Capillary: 132 mg/dL — ABNORMAL HIGH (ref 70–99)
Glucose-Capillary: 135 mg/dL — ABNORMAL HIGH (ref 70–99)
Glucose-Capillary: 162 mg/dL — ABNORMAL HIGH (ref 70–99)
Glucose-Capillary: 175 mg/dL — ABNORMAL HIGH (ref 70–99)

## 2020-06-01 LAB — CBC WITH DIFFERENTIAL/PLATELET
Abs Immature Granulocytes: 0.04 10*3/uL (ref 0.00–0.07)
Basophils Absolute: 0 10*3/uL (ref 0.0–0.1)
Basophils Relative: 0 %
Eosinophils Absolute: 0 10*3/uL (ref 0.0–0.5)
Eosinophils Relative: 0 %
HCT: 29.3 % — ABNORMAL LOW (ref 36.0–46.0)
Hemoglobin: 10.2 g/dL — ABNORMAL LOW (ref 12.0–15.0)
Immature Granulocytes: 1 %
Lymphocytes Relative: 17 %
Lymphs Abs: 1.5 10*3/uL (ref 0.7–4.0)
MCH: 32.1 pg (ref 26.0–34.0)
MCHC: 34.8 g/dL (ref 30.0–36.0)
MCV: 92.1 fL (ref 80.0–100.0)
Monocytes Absolute: 0.6 10*3/uL (ref 0.1–1.0)
Monocytes Relative: 7 %
Neutro Abs: 6.5 10*3/uL (ref 1.7–7.7)
Neutrophils Relative %: 75 %
Platelets: 212 10*3/uL (ref 150–400)
RBC: 3.18 MIL/uL — ABNORMAL LOW (ref 3.87–5.11)
RDW: 13.4 % (ref 11.5–15.5)
WBC: 8.6 10*3/uL (ref 4.0–10.5)
nRBC: 0 % (ref 0.0–0.2)

## 2020-06-01 LAB — COMPREHENSIVE METABOLIC PANEL
ALT: 54 U/L — ABNORMAL HIGH (ref 0–44)
AST: 38 U/L (ref 15–41)
Albumin: 3.4 g/dL — ABNORMAL LOW (ref 3.5–5.0)
Alkaline Phosphatase: 97 U/L (ref 38–126)
Anion gap: 9 (ref 5–15)
BUN: 27 mg/dL — ABNORMAL HIGH (ref 6–20)
CO2: 26 mmol/L (ref 22–32)
Calcium: 10.1 mg/dL (ref 8.9–10.3)
Chloride: 100 mmol/L (ref 98–111)
Creatinine, Ser: 1.66 mg/dL — ABNORMAL HIGH (ref 0.44–1.00)
GFR, Estimated: 38 mL/min — ABNORMAL LOW (ref >60)
Glucose, Bld: 151 mg/dL — ABNORMAL HIGH (ref 70–99)
Potassium: 3.1 mmol/L — ABNORMAL LOW (ref 3.5–5.1)
Sodium: 135 mmol/L (ref 135–145)
Total Bilirubin: 0.9 mg/dL (ref 0.3–1.2)
Total Protein: 5.7 g/dL — ABNORMAL LOW (ref 6.5–8.1)

## 2020-06-01 LAB — SURGICAL PATHOLOGY

## 2020-06-01 MED ORDER — SODIUM CHLORIDE 0.9 % IV BOLUS
1000.0000 mL | Freq: Once | INTRAVENOUS | Status: AC
  Administered 2020-06-01: 1000 mL via INTRAVENOUS

## 2020-06-01 NOTE — Progress Notes (Signed)
Nutrition Education Note ° °Received consult for diet education for patient s/p bariatric surgery. ° °Discussed 2 week post op diet with pt. Emphasized that liquids must be non carbonated, non caffeinated, and sugar free. Fluid goals discussed. Pt to follow up with outpatient bariatric RD for further diet progression after 2 weeks. Multivitamins and minerals also reviewed. Teach back method used, pt expressed understanding, expect good compliance. ° °If nutrition issues arise, please consult RD. ° °Melanie Dowdell, MS, RD, LDN °Inpatient Clinical Dietitian °Contact information available via Amion ° ° °

## 2020-06-01 NOTE — Progress Notes (Signed)
Patient BP dropped to 74/46 when transitioning from lying position to standing position. Patient felt dizzy and lightheaded. When patient laid back down BP was up to 111/64. Educated patient on orthostatic hypotension and the importance of transitioning slowly. IV fluids continued, pt educated on safety. Vitals stable when in bed, Will continue to assess.

## 2020-06-01 NOTE — Progress Notes (Signed)
Progress Note: General Surgery Service   Chief Complaint/Subjective: Lightheaded yesterday when standing, tolerating liquids, moderate upper abdominal pain  Objective: Vital signs in last 24 hours: Temp:  [98 F (36.7 C)-98.4 F (36.9 C)] 98 F (36.7 C) (03/22 0454) Pulse Rate:  [50-71] 63 (03/22 0454) Resp:  [14-19] 18 (03/22 0454) BP: (74-162)/(46-85) 132/72 (03/22 0454) SpO2:  [94 %-100 %] 95 % (03/22 0454) Weight:  [131.1 kg] 131.1 kg (03/21 0913)    Intake/Output from previous day: 03/21 0701 - 03/22 0700 In: 3720 [P.O.:300; I.V.:3320; IV Piggyback:100] Out: 415 [Urine:400; Blood:15] Intake/Output this shift: No intake/output data recorded.  Gen: NAd  Resp: nonlabored  Card: RR  Abd: soft, incisions c/d/i  Lab Results: CBC  Recent Labs    05/31/20 1325 06/01/20 0500  WBC  --  8.6  HGB 12.5 10.2*  HCT 37.0 29.3*  PLT  --  212   BMET Recent Labs    06/01/20 0500  NA 135  K 3.1*  CL 100  CO2 26  GLUCOSE 151*  BUN 27*  CREATININE 1.66*  CALCIUM 10.1   PT/INR No results for input(s): LABPROT, INR in the last 72 hours. ABG No results for input(s): PHART, HCO3 in the last 72 hours.  Invalid input(s): PCO2, PO2  Anti-infectives: Anti-infectives (From admission, onward)   Start     Dose/Rate Route Frequency Ordered Stop   05/31/20 0915  cefoTEtan (CEFOTAN) 2 g in sodium chloride 0.9 % 100 mL IVPB        2 g 200 mL/hr over 30 Minutes Intravenous On call to O.R. 05/31/20 0903 05/31/20 1205      Medications: Scheduled Meds: . acetaminophen  1,000 mg Oral Q8H   Or  . acetaminophen (TYLENOL) oral liquid 160 mg/5 mL  1,000 mg Oral Q8H  . buPROPion  150 mg Oral BID  . busPIRone  15 mg Oral BID  . citalopram  40 mg Oral Daily  . enoxaparin (LOVENOX) injection  30 mg Subcutaneous Q12H  . insulin aspart  0-20 Units Subcutaneous Q4H  . loratadine  10 mg Oral Daily  . Ensure Max Protein  2 oz Oral Q2H  . topiramate  100 mg Oral QHS   Continuous  Infusions: . dextrose 5 % and 0.45% NaCl 125 mL/hr at 05/31/20 1724  . famotidine (PEPCID) IV 20 mg (05/31/20 2141)  . sodium chloride     PRN Meds:.hydrALAZINE, LORazepam, morphine injection, ondansetron (ZOFRAN) IV, oxyCODONE, simethicone  Assessment/Plan: s/p Procedure(s): LAPAROSCOPIC GASTRIC SLEEVE RESECTION UPPER GI ENDOSCOPY 05/31/2020 She is tolerating liquids well, moderate pain, lightheaded -IV fluid bolus -try to ambulate today -if no longer orthostatic possible discharge, but likely needs more time/fluid    LOS: 1 day   Mickeal Skinner, MD [facility]

## 2020-06-01 NOTE — Progress Notes (Signed)
Patient alert and oriented, pain is controlled. Patient is tolerating fluids, advanced to protein shake today, patient is tolerating well.  Reviewed Gastric sleeve discharge instructions with patient and patient is able to articulate understanding.  Provided information on BELT program, Support Group and [facility] outpatient pharmacy.   Pt continues to work towards ambulating in halls.  All questions answered, will continue to monitor.

## 2020-06-01 NOTE — Discharge Instructions (Signed)
GASTRIC BYPASS / SLEEVE  Home Care Instructions  These instructions are to help you care for yourself when you go home.  Call: If you have any problems. . Call [phone] and ask for the surgeon on call . If you have an emergency related to your surgery please use the ER at [facility].  . Tell the ER staff that you are a new post-op gastric bypass or gastric sleeve patient   Signs and symptoms to report: . Severe vomiting or nausea o If you cannot handle clear liquids for longer than 1 day, call your surgeon  . Abdominal pain which does not get better after taking your pain medication . Fever greater than 100.4 F and chills . Heart rate over 100 beats a minute . Trouble breathing . Chest pain .  Redness, swelling, drainage, or foul odor at incision (surgical) sites .  If your incisions open or pull apart . Swelling or pain in calf (lower leg) . Diarrhea (Loose bowel movements that happen often), frequent watery, uncontrolled bowel movements . Constipation, (no bowel movements for 3 days) if this happens:  o Take Milk of Magnesia, 2 tablespoons by mouth, 3 times a day for 2 days if needed o Stop taking Milk of Magnesia once you have had a bowel movement o Call your doctor if constipation continues Or o Take Miralax  (instead of Milk of Magnesia) following the label instructions o Stop taking Miralax once you have had a bowel movement o Call your doctor if constipation continues . Anything you think is "abnormal for you"   Normal side effects after surgery: . Unable to sleep at night or unable to concentrate . Irritability . Being tearful (crying) or depressed These are common complaints, possibly related to your anesthesia, stress of surgery and change in lifestyle, that usually go away a few weeks after surgery.  If these feelings continue, call your medical doctor.  Wound Care: You may have surgical glue, steri-strips, or staples over your incisions after surgery . Surgical  glue:  Looks like a clear film over your incisions and will wear off a little at a time . Steri-strips : Adhesive strips of tape over your incisions. You may notice a yellowish color on the skin under the steri-strips. This is used to make the   steri-strips stick better. Do not pull the steri-strips off - let them fall off . Staples: Staples may be removed before you leave the hospital o If you go home with staples, call [facility] at for an appointment with your surgeon's nurse to have staples removed 10 days after surgery, [phone] . Showering: You may shower two (2) days after your surgery unless your surgeon tells you differently o Wash gently around incisions with warm soapy water, rinse well, and gently pat dry  o If you have a drain (tube from your incision), you may need someone to hold this while you shower  o No tub baths until staples are removed and incisions are healed     Medications: . Medications should be liquid or crushed if larger than the size of a dime . Extended release pills (medication that releases a little bit at a time through the day) should not be crushed . Depending on the size and number of medications you take, you may need to space (take a few throughout the day)/change the time you take your medications so that you do not over-fill your pouch (smaller stomach) . Make sure you follow-up with   your primary care physician to make medication changes needed during rapid weight loss and life-style changes . If you have diabetes, follow up with the doctor that orders your diabetes medication(s) within one week after surgery and check your blood sugar regularly. . Do not drive while taking narcotics (pain medications) . DO NOT take NSAID'S (Examples of NSAID's include ibuprofen, naproxen)  Diet:                    First 2 Weeks  You will see the nutritionist about two (2) weeks after your surgery. The nutritionist will increase the types of foods you can  eat if you are handling liquids well: . If you have severe vomiting or nausea and cannot handle clear liquids lasting longer than 1 day, call your surgeon  Protein Shake . Drink at least 2 ounces of shake 5-6 times per day . Each serving of protein shakes (usually 8 - 12 ounces) should have a minimum of:  o 15 grams of protein  o And no more than 5 grams of carbohydrate  . Goal for protein each day: o Men = 80 grams per day o Women = 60 grams per day . Protein powder may be added to fluids such as non-fat milk or Lactaid milk or Soy milk (limit to 35 grams added protein powder per serving)  Hydration . Slowly increase the amount of water and other clear liquids as tolerated (See Acceptable Fluids) . Slowly increase the amount of protein shake as tolerated  .  Sip fluids slowly and throughout the day . May use sugar substitutes in small amounts (no more than 6 - 8 packets per day; i.e. Splenda)  Fluid Goal . The first goal is to drink at least 8 ounces of protein shake/drink per day (or as directed by the nutritionist);  See handout from pre-op Bariatric Education Class for examples of protein shake/drink.   o Slowly increase the amount of protein shake you drink as tolerated o You may find it easier to slowly sip shakes throughout the day o It is important to get your proteins in first . Your fluid goal is to drink 64 - 100 ounces of fluid daily o It may take a few weeks to build up to this . 32 oz (or more) should be clear liquids  And  . 32 oz (or more) should be full liquids (see below for examples) . Liquids should not contain sugar, caffeine, or carbonation  Clear Liquids: . Water or Sugar-free flavored water (i.e. Fruit H2O, Propel) . Decaffeinated coffee or tea (sugar-free) . Melanie [name] Lite, Wyler's Lite, Minute Maid Lite . Sugar-free Jell-O . Bouillon or broth . Sugar-free Popsicle:   *Less than 20 calories each; Limit 1 per day  Full Liquids: Protein Shakes/Drinks + 2  choices per day of other full liquids . Full liquids must be: o No More Than 12 grams of Carbs per serving  o No More Than 3 grams of Fat per serving . Strained low-fat cream soup . Non-Fat milk . Fat-free Lactaid Milk . Sugar-free yogurt (Dannon Lite & Fit, Greek yogurt)      Vitamins and Minerals . Start 1 day after surgery unless otherwise directed by your surgeon . Bariatric Specific Complete Multivitamins . Chewable Calcium Citrate with Vitamin D-3 (Example: 3 Chewable Calcium Plus 600 with Vitamin D-3) o Take 500 mg three (3) times a day for a total of 1500 mg each day o Do not take all 3 doses   of calcium at one time as it may cause constipation, and you can only absorb 500 mg  at a time  o Do not mix multivitamins containing iron with calcium supplements; take 2 hours apart  . Menstruating women and those at risk for anemia (a blood disease that causes weakness) may need extra iron o Talk with your doctor to see if you need more iron . If you need extra iron: Total daily Iron recommendation (including Vitamins) is 50 to 100 mg Iron/day . Do not stop taking or change any vitamins or minerals until you talk to your nutritionist or surgeon . Your nutritionist and/or surgeon must approve all vitamin and mineral supplements   Activity and Exercise: It is important to continue walking at home.  Limit your physical activity as instructed by your doctor.  During this time, use these guidelines: . Do not lift anything greater than ten (10) pounds for at least two (2) weeks . Do not go back to work or drive until your surgeon says you can . You may have sex when you feel comfortable  o It is VERY important for female patients to use a reliable birth control method; fertility often increases after surgery  o Do not get pregnant for at least 18 months . Start exercising as soon as your doctor tells you that you can o Make sure your doctor approves any physical activity . Start with a  simple walking program . Walk 5-15 minutes each day, 7 days per week.  . Slowly increase until you are walking 30-45 minutes per day Consider joining our BELT program. [phone] or email [email or web address]   Special Instructions Things to remember:  . Use your CPAP when sleeping if this applies to you, do not stop the use of CPAP unless directed by physician after a sleep study . [facility] has a free Bariatric Surgery Support Group that meets monthly, the 3rd Thursday, 6 pm.  Please review discharge information for date and location of this meeting. . It is very important to keep all follow up appointments with your surgeon, nutritionist, primary care physician, and behavioral health practitioner o After the first year, please follow up with your bariatric surgeon and nutritionist at least once a year in order to maintain best weight loss results   [facility]: [phone] [facility]: [phone] Bariatric Nurse Coordinator: [phone]      

## 2020-06-01 NOTE — Progress Notes (Signed)
Patient alert and oriented, Post op day 1.  Provided support and encouragement.  Encouraged pulmonary toilet, ambulation and small sips of liquids.    Pt is sitting up in chair.  IV bolus completed.  When pt walking from bathroom, had another episode of feeling dizzy, but states "it was not as bad as yesterday".  Pt is on 2nd cup of protein. MD updated.  All questions answered.  Will continue to monitor.

## 2020-06-02 ENCOUNTER — Other Ambulatory Visit (HOSPITAL_COMMUNITY): Payer: Self-pay | Admitting: General Surgery

## 2020-06-02 LAB — CBC WITH DIFFERENTIAL/PLATELET
Abs Immature Granulocytes: 0.01 10*3/uL (ref 0.00–0.07)
Basophils Absolute: 0 10*3/uL (ref 0.0–0.1)
Basophils Relative: 1 %
Eosinophils Absolute: 0.1 10*3/uL (ref 0.0–0.5)
Eosinophils Relative: 2 %
HCT: 26.9 % — ABNORMAL LOW (ref 36.0–46.0)
Hemoglobin: 9 g/dL — ABNORMAL LOW (ref 12.0–15.0)
Immature Granulocytes: 0 %
Lymphocytes Relative: 40 %
Lymphs Abs: 2.1 10*3/uL (ref 0.7–4.0)
MCH: 31.8 pg (ref 26.0–34.0)
MCHC: 33.5 g/dL (ref 30.0–36.0)
MCV: 95.1 fL (ref 80.0–100.0)
Monocytes Absolute: 0.3 10*3/uL (ref 0.1–1.0)
Monocytes Relative: 6 %
Neutro Abs: 2.7 10*3/uL (ref 1.7–7.7)
Neutrophils Relative %: 51 %
Platelets: 185 10*3/uL (ref 150–400)
RBC: 2.83 MIL/uL — ABNORMAL LOW (ref 3.87–5.11)
RDW: 14.1 % (ref 11.5–15.5)
WBC: 5.3 10*3/uL (ref 4.0–10.5)
nRBC: 0 % (ref 0.0–0.2)

## 2020-06-02 LAB — GLUCOSE, CAPILLARY
Glucose-Capillary: 126 mg/dL — ABNORMAL HIGH (ref 70–99)
Glucose-Capillary: 132 mg/dL — ABNORMAL HIGH (ref 70–99)
Glucose-Capillary: 140 mg/dL — ABNORMAL HIGH (ref 70–99)

## 2020-06-02 MED ORDER — ACETAMINOPHEN 500 MG PO TABS: 1000.0000 mg | ORAL_TABLET | Freq: Three times a day (TID) | ORAL | 0 refills | Status: AC

## 2020-06-02 MED ORDER — ONDANSETRON 4 MG PO TBDP: 4.0000 mg | ORAL_TABLET | Freq: Four times a day (QID) | ORAL | 0 refills | Status: DC | PRN

## 2020-06-02 MED FILL — ONDANSETRON ODT 4 MG TABLET: 4 | 4 days supply | Qty: 18 | Fill #0

## 2020-06-02 NOTE — Progress Notes (Signed)
Patient alert and oriented, Post op day 2.  Provided support and encouragement.  Encouraged pulmonary toilet, ambulation and small sips of liquids.  All questions answered.  Will continue to monitor. 

## 2020-06-02 NOTE — Progress Notes (Signed)
Pt was discharged home today. Instructions were reviewed with patient, and questions were answered. Pt was taken to main entrance via wheelchair by NT.  

## 2020-06-02 NOTE — Discharge Summary (Signed)
Physician Discharge Summary  Melanie [name] KGM:[id number] DOB: December 22, 1975 DOA: 05/31/2020  PCP: [name], NP  Admit date: 05/31/2020 Discharge date: 06/02/2020  Recommendations for Outpatient Follow-up:  1.  (include homehealth, outpatient follow-up instructions, specific recommendations for PCP to follow-up on, etc.)   Follow-up Information    [name], De Blanch, MD. Go on 06/18/2020.   Specialty: General Surgery Why: at 11:20am. Please arrive 15 minutes prior to your appointment time.  Thank you. Contact information: [address] [place] [address] [phone]        Surgery, [facility]. Go on 07/22/2020.   Specialty: General Surgery Why: at 3:20pm.  Please arrive 15 minutes prior to your appointment time.  Thank you. Contact information: [address] [place] [address] [phone]              Discharge Diagnoses:  Active Problems:   Morbid obesity (HCC)   Surgical Procedure: laparoscopic sleeve gastrectomy, upper endoscopy  Discharge Condition: Good Disposition: Home  Diet recommendation: Postoperative sleeve gastrectomy diet (liquids only)  Filed Weights   05/31/20 0913  Weight: 131.1 kg     Hospital Course:  The patient was admitted after undergoing laparoscopic sleeve gastrectomy. POD 0 she ambulated well. POD 1 she was started on the water diet protocol and tolerated 300 ml in the first shift. She had orthostatic hypotension and LH which responded to fluid bolus. Once meeting the water amount she was advanced to bariatric protein shakes which they tolerated and were discharged home POD 2.  Treatments: surgery: laparoscopic sleeve gastrectomy  Discharge Instructions  Discharge Instructions    Ambulate hourly while awake   Complete by: As directed    Call MD for:  difficulty breathing, headache or visual disturbances   Complete by: As directed    Call MD for:  persistant dizziness or light-headedness    Complete by: As directed    Call MD for:  persistant nausea and vomiting   Complete by: As directed    Call MD for:  redness, tenderness, or signs of infection (pain, swelling, redness, odor or green/yellow discharge around incision site)   Complete by: As directed    Call MD for:  severe uncontrolled pain   Complete by: As directed    Call MD for:  temperature >101 F   Complete by: As directed    Diet bariatric full liquid   Complete by: As directed    Discharge wound care:   Complete by: As directed    Remove Bandaids tomorrow, ok to shower tomorrow. Steristrips may fall off in 1-3 weeks.   Incentive spirometry   Complete by: As directed    Perform hourly while awake     Allergies as of 06/02/2020      Reactions   Welchol [colesevelam] Shortness Of Breath   Lidocaine Other (See Comments)   Flu like sx's   Pistachio Nut (diagnostic) Itching, Swelling   Swollen tongue/itchy mouth   Relafen [nabumetone] Other (See Comments)   migraine   Celebrex [celecoxib] Rash   Sulfa Antibiotics Rash      Medication List    STOP taking these medications   BD Pen Needle Nano 2nd Gen 32G X 4 MM Misc Generic drug: Insulin Pen Needle   chlorthalidone 25 MG tablet Commonly known as: HYGROTON   Saxenda 18 MG/3ML Sopn Generic drug: Liraglutide -Weight Management     TAKE these medications   acetaminophen 500 MG tablet Commonly known as: TYLENOL Take  2 tablets (1,000 mg total) by mouth every 8 (eight) hours for 5 days.   buPROPion 150 MG 12 hr tablet Commonly known as: WELLBUTRIN SR Take 1 tablet (150 mg total) by mouth 2 (two) times daily.   busPIRone 15 MG tablet Commonly known as: BUSPAR Take 1 tablet (15 mg total) by mouth 2 (two) times daily.   cetirizine 10 MG tablet Commonly known as: ZYRTEC Take 10 mg by mouth at bedtime.   cholecalciferol 25 MCG (1000 UNIT) tablet Commonly known as: VITAMIN D Take 4,000 Units by mouth daily.   citalopram 40 MG tablet Commonly  known as: CELEXA Take 1 tablet (40 mg total) by mouth daily.   Fish Oil 1200 MG Caps Take 1,200 mg by mouth in the morning and at bedtime.   fluticasone 50 MCG/ACT nasal spray Commonly known as: FLONASE Place 2 sprays into both nostrils daily.   levothyroxine 88 MCG tablet Commonly known as: SYNTHROID Take 1 tablet (88 mcg total) by mouth daily before breakfast.   LORazepam 1 MG tablet Commonly known as: ATIVAN Take 1 tablet (1 mg total) by mouth 2 (two) times daily as needed for anxiety.   Lubricant Eye Drops 0.4-0.3 % Soln Generic drug: Polyethyl Glycol-Propyl Glycol Place 1 drop into both eyes 3 (three) times daily as needed (dry/irritated eyes).   metFORMIN 500 MG tablet Commonly known as: GLUCOPHAGE Take 1 tablet (500 mg total) by mouth 2 (two) times daily. Notes to patient: Monitor Blood Sugar Frequently and keep a log for primary care physician, you may need to adjust medication dosage with rapid weight loss.     montelukast 10 MG tablet Commonly known as: SINGULAIR Take 1 tablet (10 mg total) by mouth at bedtime.   multivitamin with minerals Tabs tablet Take 1 tablet by mouth daily.   omeprazole 20 MG capsule Commonly known as: PRILOSEC Take 1 capsule (20 mg total) by mouth daily.   ondansetron 4 MG disintegrating tablet Commonly known as: ZOFRAN-ODT Take 1 tablet (4 mg total) by mouth every 6 (six) hours as needed for nausea or vomiting.   Pitavastatin Calcium 2 MG Tabs Take 1 tablet (2 mg total) by mouth daily.   Praluent 150 MG/ML Soaj Generic drug: Alirocumab Inject 1 Dose into the skin every 14 (fourteen) days. What changed: how much to take   topiramate 100 MG tablet Commonly known as: TOPAMAX Take 1 tablet (100 mg total) by mouth at bedtime.   Ubrelvy 50 MG Tabs Generic drug: Ubrogepant Take 50 mg by mouth every 2 (two) hours as needed (may repeat once after 2 hours).   Vitamin D (Ergocalciferol) 1.25 MG (50000 UNIT) Caps capsule Commonly  known as: DRISDOL Take 1 capsule (50,000 Units total) by mouth every 7 (seven) days. One tab every 7 days            Discharge Care Instructions  (From admission, onward)         Start     Ordered   06/02/20 0000  Discharge wound care:       Comments: Remove Bandaids tomorrow, ok to shower tomorrow. Steristrips may fall off in 1-3 weeks.   06/02/20 6734          Follow-up Information    Marissa Lowrey, De Blanch, MD. Go on 06/18/2020.   Specialty: General Surgery Why: at 11:20am. Please arrive 15 minutes prior to your appointment time.  Thank you. Contact information: [address] [place] [address] [phone]  Surgery, [facility]. Go on 07/22/2020.   Specialty: General Surgery Why: at 3:20pm.  Please arrive 15 minutes prior to your appointment time.  Thank you. Contact information: [address] [place] [address] [phone]                The results of significant diagnostics from this hospitalization (including imaging, microbiology, ancillary and laboratory) are listed below for reference.    Significant Diagnostic Studies: No results found.  Labs: Basic Metabolic Panel: Recent Labs  Lab 06/01/20 0500  NA 135  K 3.1*  CL 100  CO2 26  GLUCOSE 151*  BUN 27*  CREATININE 1.66*  CALCIUM 10.1   Liver Function Tests: Recent Labs  Lab 06/01/20 0500  AST 38  ALT 54*  ALKPHOS 97  BILITOT 0.9  PROT 5.7*  ALBUMIN 3.4*    CBC: Recent Labs  Lab 05/31/20 1325 06/01/20 0500 06/02/20 0451  WBC  --  8.6 5.3  NEUTROABS  --  6.5 2.7  HGB 12.5 10.2* 9.0*  HCT 37.0 29.3* 26.9*  MCV  --  92.1 95.1  PLT  --  212 185    CBG: Recent Labs  Lab 06/01/20 1536 06/01/20 2059 06/02/20 0030 06/02/20 0352 06/02/20 0749  GLUCAP 124* 132* 132* 140* 126*    Active Problems:   Morbid obesity (HCC)   VTE plan: no chemical prophylaxis  recommended ([email or web address])  Time coordinating discharge: 15 min

## 2020-06-02 NOTE — Progress Notes (Signed)
24hr fluid recall: 120m of protein and water.  Per dehydration protocol, Will call pt for f/u within one week post op.

## 2020-06-02 NOTE — Plan of Care (Signed)

## 2020-06-04 ENCOUNTER — Telehealth (HOSPITAL_COMMUNITY): Payer: Self-pay | Admitting: *Deleted

## 2020-06-07 NOTE — Telephone Encounter (Signed)
Patient called to discuss post bariatric surgery follow up questions.  See below:   1.  Tell me about your pain and pain management? Pt denies pain.  2.  Let's talk about fluid intake.  How much total fluid are you taking in? Pt states that she is working towards her goal of 64 fluid oz each day with protein shakes, water, flavored water, and broth.  She has been able to meet her goals most days, but did not meet it a few days.    3.  How much protein have you taken in the last 2 days? Pt states that she is working to meet her goal of 60g of protein each day with protein shakes and powder protein added to broth.  She has been able to meet her goal most days. Pt instructed to prioritize protein to meet goal each day.  4.  Have you had nausea?  Tell me about when have experienced nausea and what you did to help? Pt denies nausea.  5.  Has the frequency or color changed with your urine? Pt states that she is "urinating ok" and that the color and frequency of her urine is fine.  6.  Tell me what your incisions look like? Pt states that she called [facility] on Friday about incision pain and received guidance.  Pt states that pain is much better. Pt states that her "incisions look fine".  All of her cites still have the steri strips. Pt does states that one incision is "very sore and feels like I did 100 sit ups ".  Pt denies fever or chills.  Pt states that incisions are not open, draining, or swollen.  Pt encouraged to call [facility] if incisions change.     7.  Have you been passing gas? BM? Pt states that she has had several BMs, "some looser than others, but the last one was formed". Last BM 06/06/20. Pt instructed to call [facility] is BMs worsen to become more loose or like diarrhea.  8.  If a problem or question were to arise who would you call?  Do you know contact numbers for [facility], [facility], and [facility]? Pt states that she called [facility] about having a "dizzy spell" on Friday.  Pt received guidance from the office.  Pt  denies any current s/sx of dehydration. Pt is able to recall s/sx of dehydration.  Pt knows to call [facility] for surgical, [facility] for nutrition, and [facility] for non-urgent questions or concerns.  9.  How has the walking going? Pt states that she is walk around and be active without any difficulty.  10.  How are your vitamins and calcium going?  How are you taking them? Pt states she is taking her vitamin and supplements without any difficulty.    Reminded patient that first 30 days post-operatively are important for successful recovery.  Practice good hand hygiene, wearing a mask when appropriate (since optional in most places), and minimizing exposure to people who live outside of the home, especially if they are exhibiting any respiratory, GI, or illness-like symptoms.

## 2020-06-08 ENCOUNTER — Ambulatory Visit: Payer: BC Managed Care – PPO

## 2020-06-15 ENCOUNTER — Other Ambulatory Visit: Payer: Self-pay

## 2020-06-15 ENCOUNTER — Encounter: Payer: BC Managed Care – PPO | Attending: General Surgery | Admitting: Skilled Nursing Facility1

## 2020-06-15 DIAGNOSIS — Z6841 Body Mass Index (BMI) 40.0 and over, adult: Secondary | ICD-10-CM | POA: Insufficient documentation

## 2020-06-16 NOTE — Progress Notes (Signed)
2 Week Post-Operative Nutrition Class   Patient was seen on 05/07/18 for Post-Operative Nutrition education at [facility].    Surgery date: 05/31/2020 Surgery type: sleeve Start weight at [facility]: 324 Weight today: 273.1  Body Composition Scale 06/16/2020  Current Body Weight 273.1  Total Body Fat % 45.2  Visceral Fat 15  Fat-Free Mass % 54.7   Total Body Water % 41.8  Muscle-Mass lbs 34.8  BMI 41.5  Body Fat Displacement          Torso  lbs 76.6         Left Leg  lbs 15.3         Right Leg  lbs 15.3         Left Arm  lbs 7.6         Right Arm   lbs 7.6      The following the learning objectives were met by the patient during this course:  Identifies Phase 3 (Soft, High Proteins) Dietary Goals and will begin from 2 weeks post-operatively to 2 months post-operatively  Identifies appropriate sources of fluids and proteins   Identifies appropriate fat sources and healthy verses unhealthy fat types    States protein recommendations and appropriate sources post-operatively  Identifies the need for appropriate texture modifications, mastication, and bite sizes when consuming solids  Identifies appropriate multivitamin and calcium sources post-operatively  Describes the need for physical activity post-operatively and will follow MD recommendations  States when to call healthcare provider regarding medication questions or post-operative complications   Handouts given during class include:  Phase 3A: Soft, High Protein Diet Handout  Phase 3 High Protein Meals  Healthy Fats   Follow-Up Plan: Patient will follow-up at [facility] in 6 weeks for 2 month post-op nutrition visit for diet advancement per MD.

## 2020-06-21 ENCOUNTER — Telehealth: Payer: Self-pay | Admitting: Skilled Nursing Facility1

## 2020-06-21 NOTE — Telephone Encounter (Signed)
RD called pt to verify fluid intake once starting soft, solid proteins 2 week post-bariatric surgery.   Daily Fluid intake: 50-64 oz Daily Protein intake: 50-60 g  Concerns/issues:   None stated.

## 2020-07-27 ENCOUNTER — Other Ambulatory Visit: Payer: Self-pay

## 2020-07-27 ENCOUNTER — Encounter: Payer: BC Managed Care – PPO | Attending: General Surgery | Admitting: Skilled Nursing Facility1

## 2020-07-27 NOTE — Progress Notes (Signed)
Bariatric Nutrition Follow-Up Visit Medical Nutrition Therapy  Appt Start Time: 10:23 End Time:   2 Months Post-Operative sleeve Surgery   NUTRITION ASSESSMENT    Anthropometrics  Surgery date: 05/31/2020 Surgery type: sleeve Start weight at [facility]: 324 Weight today: 257  Body Composition Scale 06/16/2020 07/27/2020  Current Body Weight 273.1 257  Total Body Fat % 45.2 43.6  Visceral Fat 15   Fat-Free Mass % 54.7    Total Body Water % 41.8 42.6  Muscle-Mass lbs 34.8   BMI 41.5   Body Fat Displacement           Torso  lbs 76.6          Left Leg  lbs 15.3          Right Leg  lbs 15.3          Left Arm  lbs 7.6          Right Arm   lbs 7.6    Clinical  Medical hx: prediabetes  Medications: see list Labs: A1C 5.8 Notable signs/symptoms: migraines, neck pain Any previous deficiencies? Iron-Anemia when teenager   Lifestyle & Dietary Hx  Pt states she has felt some irritation or bump in her upper abdomen stating she starting protonix to see if it helps. Pt states this are has some pain any time not necessarily just with food. Pt states she has not had a chance to have her surgeon examine it.  Pt states it takes her a bout an hour to take all of her medicines.  Pt states se has been logging her fluids with an app.  Pt states in the last week her stomach has been more sensitive taking her longer int he morning to start eating or drinking.  Pt states she has had more nausea in the last week or 2. Pt states she plans to do BELT.   Estimated daily fluid intake:  oz Estimated daily protein intake: 60+ g Supplements: procare multi capsule and 3 calcium  Current average weekly physical activity: walking a little bit more  24-Hr Dietary Recall First Meal: cheese or chicken Snack:  Second Meal: chicken or yogurt or edamame  Snack: cheese Third Meal: chicken or hamburger patty or refried beans + cheese Snack:  Beverages: water, water + flavorings, skim milk  Post-Op Goals/  Signs/ Symptoms Using straws: no Drinking while eating: no Chewing/swallowing difficulties: no Changes in vision: no Changes to mood/headaches: no Hair loss/changes to skin/nails: no Difficulty focusing/concentrating: no Sweating: no Dizziness/lightheadedness: no Palpitations: no  Carbonated/caffeinated beverages: no N/V/D/C/Gas: stated constipation: taking laxatives a couple times a week Abdominal pain: no Dumping syndrome: no    NUTRITION DIAGNOSIS  Overweight/obesity ([place]-3.3) related to past poor dietary habits and physical inactivity as evidenced by completed bariatric surgery and following dietary guidelines for continued weight loss and healthy nutrition status.     NUTRITION INTERVENTION Nutrition counseling (C-1) and education (E-2) to facilitate bariatric surgery goals, including: . Diet advancement to the next phase (phase 4) now including non starchy  . The importance of consuming adequate calories as well as certain nutrients daily due to the body's need for essential vitamins, minerals, and fats . The importance of daily physical activity and to reach a goal of at least 150 minutes of moderate to vigorous physical activity weekly (or as directed by their physician) due to benefits such as increased musculature and improved lab values . The importance of intuitive eating specifically learning hunger-satiety cues and understanding the importance of  learning a new body: The importance of mindful eating to avoid grazing behaviors   Goals: -Try alkaline water pH 8.8 -Continue to aim for a minimum of 64 fluid ounces 7 days a week with at least 30 ounces being plain water -Eat non-starchy vegetables 2 times a day 7 days a week -Start out with soft cooked vegetables today and tomorrow; if tolerated begin to eat raw vegetables or cooked including salads -Eat your 3 ounces of protein first then start in on your non-starchy vegetables; once you understand how much of your meal  leads to satisfaction and not full while still eating 3 ounces of protein and non-starchy vegetables you can eat them in any order  -Continue to aim for 30 minutes of activity at least 5 times a week -Do NOT cook with/add to your food: alfredo sauce, cheese sauce, barbeque sauce, ketchup, fat back, butter, bacon grease, grease, Crisco, OR SUGAR -Switch your multi to after dinner   Handouts Provided Include   Phase 4  Learning Style & Readiness for Change Teaching method utilized: Visual & Auditory  Demonstrated degree of understanding via: Teach Back  Readiness Level: Action Barriers to learning/adherence to lifestyle change: none identified   RD's Notes for Next Visit . Assess adherence to pt chosen goals   MONITORING & EVALUATION Dietary intake, weekly physical activity, body weight  Next Steps Patient is to follow-up in

## 2020-08-20 ENCOUNTER — Other Ambulatory Visit: Payer: Self-pay | Admitting: Registered Nurse

## 2020-08-20 DIAGNOSIS — F419 Anxiety disorder, unspecified: Secondary | ICD-10-CM

## 2020-09-07 ENCOUNTER — Telehealth: Payer: BC Managed Care – PPO | Admitting: Adult Health

## 2020-09-22 ENCOUNTER — Other Ambulatory Visit: Payer: Self-pay | Admitting: Registered Nurse

## 2020-09-22 DIAGNOSIS — R7303 Prediabetes: Secondary | ICD-10-CM

## 2020-10-19 ENCOUNTER — Encounter: Payer: BC Managed Care – PPO | Attending: General Surgery | Admitting: Skilled Nursing Facility1

## 2020-10-19 ENCOUNTER — Other Ambulatory Visit: Payer: Self-pay

## 2020-10-19 DIAGNOSIS — Z6841 Body Mass Index (BMI) 40.0 and over, adult: Secondary | ICD-10-CM | POA: Diagnosis present

## 2020-10-20 NOTE — Progress Notes (Signed)
Follow-up visit:  Post-Operative sleeve gastrectomy Surgery  Medical Nutrition Therapy:  Appt start time: 6:00pm end time:  7:00pm  Primary concerns today: Post-operative Bariatric Surgery Nutrition Management 6 Month Post-Op Class  Anthropometrics  Surgery date: 05/31/2020 Surgery type: sleeve Start weight at [facility]: 324 pounds Weight today: pt declined  Body Composition Scale 06/16/2020 07/27/2020  Current Body Weight 273.1 257  Total Body Fat % 45.2 43.6  Visceral Fat 15   Fat-Free Mass % 54.7    Total Body Water % 41.8 42.6  Muscle-Mass lbs 34.8   BMI 41.5   Body Fat Displacement           Torso  lbs 76.6          Left Leg  lbs 15.3          Right Leg  lbs 15.3          Left Arm  lbs 7.6          Right Arm   lbs 7.6    Clinical  Medical hx: prediabetes  Medications: see list Labs: A1C 5.8 Notable signs/symptoms: migraines, neck pain Any previous deficiencies? Iron-Anemia when teenager    Information Reviewed/ Discussed During Appointment: -Review of composition scale numbers -Fluid requirements (64-100 ounces) -Protein requirements (60-80g) -Strategies for tolerating diet -Advancement of diet to include Starchy vegetables -Barriers to inclusion of new foods -Inclusion of appropriate multivitamin and calcium supplements  -Exercise recommendations   Fluid intake: adequate   Medications: See List Supplementation: appropriate    Using straws: no Drinking while eating: no Having you been chewing well: yes Chewing/swallowing difficulties: no Changes in vision: no Changes to mood/headaches: no Hair loss/Cahnges to skin/Changes to nails: no Any difficulty focusing or concentrating: no Sweating: no Dizziness/Lightheaded: no Palpitations: no  Carbonated beverages: no N/V/D/C/GAS: no Abdominal Pain: no Dumping syndrome: no  Recent physical activity:  ADL's  Progress Towards Goal(s):  In Progress Teaching method utilized: Visual & Auditory  Demonstrated  degree of understanding via: Teach Back  Readiness Level: Action Barriers to learning/adherence to lifestyle change: none identified  Handouts given during visit include: Phase V diet Progression  Goals Sheet The Benefits of Exercise are endless..... Support Group Topics   Teaching Method Utilized:  Visual Auditory Hands on  Demonstrated degree of understanding via:  Teach Back   Monitoring/Evaluation:  Dietary intake, exercise, and body weight. Follow up in 3 months for 9 month post-op visit.

## 2020-10-21 ENCOUNTER — Other Ambulatory Visit (INDEPENDENT_AMBULATORY_CARE_PROVIDER_SITE_OTHER): Payer: Self-pay | Admitting: Physician Assistant

## 2020-10-21 DIAGNOSIS — E038 Other specified hypothyroidism: Secondary | ICD-10-CM

## 2020-10-22 ENCOUNTER — Other Ambulatory Visit: Payer: Self-pay | Admitting: Adult Health

## 2020-11-06 ENCOUNTER — Other Ambulatory Visit: Payer: Self-pay | Admitting: Adult Health

## 2020-11-09 ENCOUNTER — Other Ambulatory Visit: Payer: Self-pay | Admitting: Registered Nurse

## 2020-11-09 ENCOUNTER — Other Ambulatory Visit: Payer: Self-pay | Admitting: Adult Health

## 2020-11-09 ENCOUNTER — Other Ambulatory Visit: Payer: Self-pay | Admitting: Internal Medicine

## 2020-11-09 DIAGNOSIS — F419 Anxiety disorder, unspecified: Secondary | ICD-10-CM

## 2020-11-18 ENCOUNTER — Other Ambulatory Visit: Payer: Self-pay

## 2020-11-18 ENCOUNTER — Telehealth (INDEPENDENT_AMBULATORY_CARE_PROVIDER_SITE_OTHER): Payer: BC Managed Care – PPO | Admitting: Registered Nurse

## 2020-11-18 DIAGNOSIS — Z903 Acquired absence of stomach [part of]: Secondary | ICD-10-CM

## 2020-11-18 DIAGNOSIS — E038 Other specified hypothyroidism: Secondary | ICD-10-CM | POA: Diagnosis not present

## 2020-11-18 DIAGNOSIS — G43709 Chronic migraine without aura, not intractable, without status migrainosus: Secondary | ICD-10-CM

## 2020-11-18 DIAGNOSIS — E559 Vitamin D deficiency, unspecified: Secondary | ICD-10-CM | POA: Diagnosis not present

## 2020-11-18 DIAGNOSIS — Z13228 Encounter for screening for other metabolic disorders: Secondary | ICD-10-CM

## 2020-11-18 DIAGNOSIS — Z13220 Encounter for screening for lipoid disorders: Secondary | ICD-10-CM

## 2020-11-18 DIAGNOSIS — Z1329 Encounter for screening for other suspected endocrine disorder: Secondary | ICD-10-CM

## 2020-11-18 DIAGNOSIS — Z130 Encounter for screening for diseases of the blood and blood-forming organs and certain disorders involving the immune mechanism: Secondary | ICD-10-CM

## 2020-11-18 MED ORDER — LEVOTHYROXINE SODIUM 88 MCG PO TABS: 88.0000 ug | ORAL_TABLET | Freq: Every day | ORAL | 0 refills | Status: DC

## 2020-11-18 MED ORDER — TOPIRAMATE 100 MG PO TABS: 100.0000 mg | ORAL_TABLET | Freq: Every day | ORAL | 0 refills | Status: DC

## 2020-11-18 NOTE — Progress Notes (Signed)
Telemedicine Encounter- SOAP NOTE Established Patient  This telephone encounter was conducted with the patient's (or proxy's) verbal consent via audio telecommunications: yes/no: Yes Patient was instructed to have this encounter in a suitably private space; and to only have persons present to whom they give permission to participate. In addition, patient identity was confirmed by use of name plus two identifiers (DOB and address).  I discussed the limitations, risks, security and privacy concerns of performing an evaluation and management service by telephone and the availability of in person appointments. I also discussed with the patient that there may be a patient responsible charge related to this service. The patient expressed understanding and agreed to proceed.  I spent a total of 14 minutes talking with the patient or their proxy.  Patient at home Provider in office  Participants: Kathrin Ruddy, NP and [name]  Chief Complaint  Patient presents with   Follow-up    Med recheck    Subjective   Melanie [name] is a 45 y.o. established patient. Telephone visit today for med check   HPI Levothyroxine: Has been on for a few years - 75mg po qd Good effect, no AE Has run out recently. Notes weight loss, but has been focused on diet and exercise, s/p gastric sleeve.  Topamax: 1022mpo qd Had been taking for migraines Ran out a few weeks ago, feeling more migraines since. No AE when taking.  Patient Active Problem List   Diagnosis Date Noted   Morbid obesity ([facility]03/21/2022   Vitamin D deficiency 12/26/2018   Class 3 severe obesity with serious comorbidity and body mass index (BMI) of 45.0 to 49.9 in adult ([facility]   Right tennis elbow 07/30/2018   Cubital tunnel syndrome 07/30/2018   Chronic pain of both feet 01/23/2018   Bilateral calcaneal spurs 01/23/2018   Prediabetes 05/23/2017   Liver fibrosis 05/23/2017   Hyperlipidemia 04/16/2017   OSA  (obstructive sleep apnea) 11/23/2015   NASH (nonalcoholic steatohepatitis) 08/27/2015   Dependent edema 08/27/2015   BMI 40.0-44.9, adult ([facility]01/04/2015   Hepatic fibrosis 06/08/2014   Depression with anxiety 06/06/2013   Perennial allergic rhinitis 06/06/2013    Past Medical History:  Diagnosis Date   Allergy    Anemia    Anxiety    Arthritis    Back pain    Constipation    COVID-19 05/27/2019   Depression    Fatty liver    GERD (gastroesophageal reflux disease)    Hyperlipidemia    Hyperthyroidism    Infertility, female    Joint pain    Kidney problem    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 (Patient taking differently: Inject 150 mg into the skin every 14 (fourteen) days.) 2 pen 11   buPROPion (WELLBUTRIN SR) 150 MG 12 hr tablet Take  1 tablet (150 mg total) by mouth 2 (two) times daily. 180 tablet 0   busPIRone (BUSPAR) 15 MG tablet Take 1 tablet (15 mg total) by mouth 2 (two) times daily. 180 tablet 3   cetirizine (ZYRTEC) 10 MG tablet Take 10 mg by mouth at bedtime.      cholecalciferol (VITAMIN D) 25 MCG (1000 UNIT) tablet Take 4,000 Units by mouth daily.     citalopram (CELEXA) 40 MG tablet TAKE ONE TABLET BY MOUTH DAILY 90 tablet 0   fluticasone (FLONASE) 50 MCG/ACT nasal spray Place 2 sprays into both nostrils daily. 16 g 3   LORazepam (ATIVAN) 1 MG tablet Take 1 tablet (1 mg total) by mouth 2 (two) times daily as needed for anxiety. 20  tablet 0   metFORMIN (GLUCOPHAGE) 500 MG tablet TAKE ONE TABLET BY MOUTH TWICE A DAY 180 tablet 0   montelukast (SINGULAIR) 10 MG tablet Take 1 tablet (10 mg total) by mouth at bedtime. 90 tablet 3   Multiple Vitamin (MULTIVITAMIN WITH MINERALS) TABS tablet Take 1 tablet by mouth daily.     Omega-3 Fatty Acids (FISH OIL) 1200 MG CAPS Take 1,200 mg by mouth in the morning and at bedtime.     omeprazole (PRILOSEC) 20 MG capsule Take 1 capsule (20 mg total) by mouth daily. 90 capsule 0   ondansetron (ZOFRAN-ODT) 4 MG disintegrating tablet DISSOLVE 1 TABLET BY MOUTH EVERY 6 HOURS AS NEEDED FOR NAUSEA OR VOMITING 20 tablet 0   Pitavastatin Calcium 2 MG TABS Take 1 tablet (2 mg total) by mouth daily. 30 tablet 11   Polyethyl Glycol-Propyl Glycol (LUBRICANT EYE DROPS) 0.4-0.3 % SOLN Place 1 drop into both eyes 3 (three) times daily as needed (dry/irritated eyes).     Ubrogepant (UBRELVY) 50 MG TABS Take 50 mg by mouth every 2 (two) hours as needed (may repeat once after 2 hours). 30 tablet 2   levothyroxine (SYNTHROID) 88 MCG tablet Take 1 tablet (88 mcg total) by mouth daily before breakfast. 90 tablet 0   topiramate (TOPAMAX) 100 MG tablet Take 1 tablet (100 mg total) by mouth at bedtime. 90 tablet 0   Vitamin D, Ergocalciferol, (DRISDOL) 1.25 MG (50000 UNIT) CAPS capsule Take 1 capsule (50,000 Units total) by mouth every 7 (seven) days. One tab every 7 days (Patient not taking: Reported on 11/18/2020) 12 capsule 3   No current facility-administered medications for this visit.    Allergies  Allergen Reactions   Welchol [Colesevelam] Shortness Of Breath   Lidocaine Other (See Comments)    Flu like sx's   Pistachio Nut (Diagnostic) Itching and Swelling    Swollen tongue/itchy mouth   Relafen [Nabumetone] Other (See Comments)    migraine   Celebrex [Celecoxib] Rash   Sulfa Antibiotics Rash    Social History   Socioeconomic History   Marital status: Married    Spouse name: [name]   Number of  children: 5   Years of education: in [employer] program   Highest education level: Bachelor's degree (e.g., BA, AB, BS)  Occupational History   Occupation: Product manager: [employer]    Comment: ESL  Tobacco Use   Smoking status: Never   Smokeless tobacco: Never  Vaping Use   Vaping Use: Never used  Substance and Sexual Activity   Alcohol use: No    Alcohol/week: 0.0 standard drinks   Drug use: No   Sexual activity: Yes    Partners: Male    Birth  control/protection: None    Comment: 1st intercourse 28yo-1 partner  Other Topics Concern   Not on file  Social History Narrative   Lives with her husband and their 5 adpoted children.    Adopted 2 sets of siblings, all with psychiatric diagnoses (ADHD, Asperger's, ODD, PTSD, depression, bipolar disorder).   Her husband has Asperger's, and works in disability services.   Occasionally drinks coke or pepsi    Social Determinants of Health   Financial Resource Strain: Not on file  Food Insecurity: Not on file  Transportation Needs: Not on file  Physical Activity: Not on file  Stress: Not on file  Social Connections: Not on file  Intimate Partner Violence: Not on file    Review of Systems  Constitutional: Negative.   HENT: Negative.    Eyes: Negative.   Respiratory: Negative.    Cardiovascular: Negative.   Gastrointestinal: Negative.   Genitourinary: Negative.   Musculoskeletal: Negative.   Skin: Negative.   Neurological: Negative.   Endo/Heme/Allergies: Negative.   Psychiatric/Behavioral: Negative.    All other systems reviewed and are negative.  Objective   Vitals as reported by the patient: There were no vitals filed for this visit.  Jelesa was seen today for follow-up.  Diagnoses and all orders for this visit:  Chronic migraine without aura without status migrainosus, not intractable -     topiramate (TOPAMAX) 100 MG tablet; Take 1 tablet (100 mg total) by mouth at bedtime.  Other specified  hypothyroidism -     levothyroxine (SYNTHROID) 88 MCG tablet; Take 1 tablet (88 mcg total) by mouth daily before breakfast. -     TSH; Future -     T4, free; Future  H/O gastric sleeve -     Vitamin D (25 hydroxy); Future -     B12 and Folate Panel; Future  Vitamin D deficiency -     Vitamin D (25 hydroxy); Future  Screening for endocrine, metabolic and immunity disorder -     CBC with Differential/Platelet; Future -     Comprehensive metabolic panel; Future -     Hemoglobin A1c; Future -     TSH; Future  Lipid screening -     Lipid panel; Future   PLAN Refill meds x 90 days Pt plans to come for lab only visit in about 1 week, fasting Future orders placed Patient encouraged to call clinic with any questions, comments, or concerns.  I discussed the assessment and treatment plan with the patient. The patient was provided an opportunity to ask questions and all were answered. The patient agreed with the plan and demonstrated an understanding of the instructions.   The patient was advised to call back or seek an in-person evaluation if the symptoms worsen or if the condition fails to improve as anticipated.  I provided 14 minutes of non-face-to-face time during this encounter.  Maximiano Coss, NP

## 2020-11-18 NOTE — Progress Notes (Signed)
I connected with  Melanie [name] on 11/18/20 by a video enabled telemedicine application and verified that I am speaking with the correct person using two identifiers.   I discussed the limitations of evaluation and management by telemedicine. The patient expressed understanding and agreed to proceed.

## 2020-11-29 ENCOUNTER — Other Ambulatory Visit: Payer: Self-pay | Admitting: Registered Nurse

## 2020-11-29 ENCOUNTER — Other Ambulatory Visit: Payer: Self-pay | Admitting: Internal Medicine

## 2020-11-29 DIAGNOSIS — F3289 Other specified depressive episodes: Secondary | ICD-10-CM

## 2020-11-29 DIAGNOSIS — F418 Other specified anxiety disorders: Secondary | ICD-10-CM

## 2020-12-03 ENCOUNTER — Telehealth: Payer: Self-pay | Admitting: Internal Medicine

## 2020-12-03 NOTE — Telephone Encounter (Signed)
PA for praluent submitted via [place]: [id number] - PA Case ID: [id number] - Rx #: [id number]

## 2020-12-06 NOTE — Telephone Encounter (Signed)
Patient approved for coverage of Praluent from 12/03/2020 - 12/03/2021

## 2020-12-26 ENCOUNTER — Other Ambulatory Visit: Payer: Self-pay | Admitting: Registered Nurse

## 2020-12-26 DIAGNOSIS — R7303 Prediabetes: Secondary | ICD-10-CM

## 2021-01-04 ENCOUNTER — Other Ambulatory Visit (INDEPENDENT_AMBULATORY_CARE_PROVIDER_SITE_OTHER): Payer: BC Managed Care – PPO

## 2021-01-04 DIAGNOSIS — Z130 Encounter for screening for diseases of the blood and blood-forming organs and certain disorders involving the immune mechanism: Secondary | ICD-10-CM | POA: Diagnosis not present

## 2021-01-04 DIAGNOSIS — Z13220 Encounter for screening for lipoid disorders: Secondary | ICD-10-CM | POA: Diagnosis not present

## 2021-01-04 DIAGNOSIS — E559 Vitamin D deficiency, unspecified: Secondary | ICD-10-CM | POA: Diagnosis not present

## 2021-01-04 DIAGNOSIS — Z1329 Encounter for screening for other suspected endocrine disorder: Secondary | ICD-10-CM

## 2021-01-04 DIAGNOSIS — E038 Other specified hypothyroidism: Secondary | ICD-10-CM

## 2021-01-04 DIAGNOSIS — Z13228 Encounter for screening for other metabolic disorders: Secondary | ICD-10-CM

## 2021-01-04 DIAGNOSIS — Z903 Acquired absence of stomach [part of]: Secondary | ICD-10-CM | POA: Diagnosis not present

## 2021-01-04 LAB — CBC WITH DIFFERENTIAL/PLATELET
Basophils Absolute: 0 10*3/uL (ref 0.0–0.1)
Basophils Relative: 0.3 % (ref 0.0–3.0)
Eosinophils Absolute: 0.1 10*3/uL (ref 0.0–0.7)
Eosinophils Relative: 3.3 % (ref 0.0–5.0)
HCT: 38.4 % (ref 36.0–46.0)
Hemoglobin: 13.1 g/dL (ref 12.0–15.0)
Lymphocytes Relative: 39.4 % (ref 12.0–46.0)
Lymphs Abs: 1.8 10*3/uL (ref 0.7–4.0)
MCHC: 34.3 g/dL (ref 30.0–36.0)
MCV: 91.2 fl (ref 78.0–100.0)
Monocytes Absolute: 0.2 10*3/uL (ref 0.1–1.0)
Monocytes Relative: 5 % (ref 3.0–12.0)
Neutro Abs: 2.4 10*3/uL (ref 1.4–7.7)
Neutrophils Relative %: 52 % (ref 43.0–77.0)
Platelets: 186 10*3/uL (ref 150.0–400.0)
RBC: 4.21 Mil/uL (ref 3.87–5.11)
RDW: 12.7 % (ref 11.5–15.5)
WBC: 4.5 10*3/uL (ref 4.0–10.5)

## 2021-01-04 LAB — B12 AND FOLATE PANEL
Folate: >23.4 ng/mL (ref >5.9)
Vitamin B-12: >1550 pg/mL — ABNORMAL HIGH (ref 211–911)

## 2021-01-04 LAB — COMPREHENSIVE METABOLIC PANEL
ALT: 40 U/L — ABNORMAL HIGH (ref 0–35)
AST: 30 U/L (ref 0–37)
Albumin: 3.9 g/dL (ref 3.5–5.2)
Alkaline Phosphatase: 155 U/L — ABNORMAL HIGH (ref 39–117)
BUN: 17 mg/dL (ref 6–23)
CO2: 29 mEq/L (ref 19–32)
Calcium: 10.8 mg/dL — ABNORMAL HIGH (ref 8.4–10.5)
Chloride: 107 mEq/L (ref 96–112)
Creatinine, Ser: 1.18 mg/dL (ref 0.40–1.20)
GFR: 55.27 mL/min — ABNORMAL LOW (ref >60.00)
Glucose, Bld: 88 mg/dL (ref 70–99)
Potassium: 4.7 mEq/L (ref 3.5–5.1)
Sodium: 141 mEq/L (ref 135–145)
Total Bilirubin: 1 mg/dL (ref 0.2–1.2)
Total Protein: 6.3 g/dL (ref 6.0–8.3)

## 2021-01-04 LAB — VITAMIN D 25 HYDROXY (VIT D DEFICIENCY, FRACTURES): VITD: 114.03 ng/mL (ref 30.00–100.00)

## 2021-01-04 LAB — LIPID PANEL
Cholesterol: 243 mg/dL — ABNORMAL HIGH (ref 0–200)
HDL: 68.3 mg/dL (ref >39.00)
LDL Cholesterol: 157 mg/dL — ABNORMAL HIGH (ref 0–99)
NonHDL: 175.02
Total CHOL/HDL Ratio: 4
Triglycerides: 91 mg/dL (ref 0.0–149.0)
VLDL: 18.2 mg/dL (ref 0.0–40.0)

## 2021-01-04 LAB — HEMOGLOBIN A1C: Hgb A1c MFr Bld: 4.7 % (ref 4.6–6.5)

## 2021-01-04 LAB — TSH: TSH: 3.42 u[IU]/mL (ref 0.35–5.50)

## 2021-01-04 LAB — T4, FREE: Free T4: 0.99 ng/dL (ref 0.60–1.60)

## 2021-01-19 ENCOUNTER — Encounter: Payer: BC Managed Care – PPO | Admitting: Skilled Nursing Facility1

## 2021-01-19 ENCOUNTER — Encounter: Payer: BC Managed Care – PPO | Attending: General Surgery | Admitting: Skilled Nursing Facility1

## 2021-01-19 NOTE — Progress Notes (Addendum)
Follow-up visit:  Post-Operative sleeve gastrectomy Surgery   Primary concerns today: Post-operative Bariatric Surgery Nutrition Management   Anthropometrics  Surgery date: 05/31/2020 Surgery type: sleeve Start weight at [facility]: 324 pounds Weight today: virtual appt  Body Composition Scale 06/16/2020 07/27/2020  Current Body Weight 273.1 257  Total Body Fat % 45.2 43.6  Visceral Fat 15   Fat-Free Mass % 54.7    Total Body Water % 41.8 42.6  Muscle-Mass lbs 34.8   BMI 41.5   Body Fat Displacement           Torso  lbs 76.6          Left Leg  lbs 15.3          Right Leg  lbs 15.3          Left Arm  lbs 7.6          Right Arm   lbs 7.6    Clinical  Medical hx: prediabetes  Medications: see list: was taking a over the counter vitamin D plus a D prescription streangth plus what was in her multivitamin: multivitamins and calcium Labs: A1C 4.7, alkaline phosphatase 155, ALT 40, Calcium 10.8, GFR 55.27, B12 over 1550 (having taken supplements before going), vitamin D 114.03, cholesterol 243, LDL 157 Notable signs/symptoms: migraines, neck pain Any previous deficiencies? Iron-Anemia when teenager  Pt states she does experience tiredness and has experienced shaky hands.  Pt states she does have the goal of increasing her fluid. Pt sates she sets alarms on her watch but lost the charger.  Pt states she no longer experiences nausea. Pt states she has had some constipation but not an issue.  Pt states she moved into her house which has stairs. Pt states [facility] so has been trying to go more.   24 hr recall: First meal: yogurt or cheese and nuts Snack: luna bar Second meal: cheese + chicken or beef + green beans or spinach or frozen meal Snack: Third meal: chicken and vegetables or edamame  Snack:  Beverages: water, diet soda  Fluid intake: 40 ounces   Medications: See List Supplementation: multi and calcium   Using straws: no Drinking while eating:  no Having you been chewing well: yes Chewing/swallowing difficulties: no Changes in vision: no Changes to mood/headaches: no Hair loss/Cahnges to skin/Changes to nails: no Any difficulty focusing or concentrating: no Sweating: no Dizziness/Lightheaded: no Palpitations: no  Carbonated beverages: yes N/V/D/C/GAS: no Abdominal Pain: no Dumping syndrome: no  Recent physical activity:  ADL's  Phase 6  Progress Towards Goal(s):  In Progress Teaching method utilized: Environmental health practitioner & Auditory  Demonstrated degree of understanding via: Teach Back  Readiness Level: Action Barriers to learning/adherence to lifestyle change: none identified  Teaching Method Utilized:  Visual Auditory Hands on  Demonstrated degree of understanding via:  Teach Back   Monitoring/Evaluation:  Dietary intake, exercise, and body weight. February

## 2021-01-24 ENCOUNTER — Other Ambulatory Visit: Payer: Self-pay | Admitting: Internal Medicine

## 2021-01-26 NOTE — Progress Notes (Deleted)
TESLYN DELAHOZ Sep 17, 1975 [id number]   History:  45 y.o. G0+5 adopted presents for annual exam. Irregular cycles for years, having night sweats and hot flashes. Normal pap and mammogram history. History of vitamin D deficiency, nonalcoholic fatty liver, pre-diabetes, hypothyroidism, migraines, and infertility. 05/2020 bariatric surgery.   Gynecologic History No LMP recorded. (Menstrual status: Perimenopausal).   Contraception/Family planning: none Sexually active: Yes  Health Maintenance Last Pap: 01/20/2020. Results were: Normal, 5-year repeat Last mammogram: Unknown Last colonoscopy: Never Last Dexa: Not indicated  Past medical history, past surgical history, family history and social history were all reviewed and documented in the EPIC chart.  ROS:  A ROS was performed and pertinent positives and negatives are included.  Exam:  There were no vitals filed for this visit.  There is no height or weight on file to calculate BMI.  General appearance:  Normal Thyroid:  Symmetrical, normal in size, without palpable masses or nodularity. Respiratory  Auscultation:  Clear without wheezing or rhonchi Cardiovascular  Auscultation:  Regular rate, without rubs, murmurs or gallops  Edema/varicosities:  Not grossly evident Abdominal  Soft,nontender, without masses, guarding or rebound.  Liver/spleen:  No organomegaly noted  Hernia:  None appreciated  Skin  Inspection:  Grossly normal   Breasts: Examined lying and sitting.   Right: Without masses, retractions, discharge or axillary adenopathy.   Left: Without masses, retractions, discharge or axillary adenopathy. Genitourinary   Inguinal/mons:  Normal without inguinal adenopathy  External genitalia:  Normal appearing vulva with no masses, tenderness, or lesions  BUS/Urethra/Skene's glands:  Normal  Vagina:  Normal appearing with normal color and discharge, no lesions  Cervix:  Normal appearing without discharge or  lesions  Uterus:  Normal in size, shape and contour.  Midline and mobile, nontender  Adnexa/parametria:     Rt: Normal in size, without masses or tenderness.   Lt: Normal in size, without masses or tenderness.  Anus and perineum: Normal  Digital rectal exam: Normal sphincter tone without palpated masses or tenderness  Patient informed chaperone available to be present for breast and pelvic exam. Patient has requested no chaperone to be present. Patient has been advised what will be completed during breast and pelvic exam.   Assessment/Plan:  45 y.o. G0 for annual exam.   Well female exam with routine gynecological exam - Education provided on SBEs, importance of preventative screenings, current guidelines, high calcium diet, regular exercise, and multivitamin daily. Labs with PCP.  Screening for cervical cancer - Has not had pap smear in many years. Normal pap history. Pap with HR HPV today.   Screening for breast cancer - Has not had mammogram in many years. Normal mammogram history. Discussed current guidelines and importance of preventative screenings. Information provided on the breast center. Normal breast exam today.   Follow up in 1 year for annual.       [name] [facility], 1:58 PM 01/26/2021

## 2021-01-27 ENCOUNTER — Ambulatory Visit: Payer: Self-pay | Admitting: Nurse Practitioner

## 2021-01-27 DIAGNOSIS — Z0289 Encounter for other administrative examinations: Secondary | ICD-10-CM

## 2021-01-27 DIAGNOSIS — Z01419 Encounter for gynecological examination (general) (routine) without abnormal findings: Secondary | ICD-10-CM

## 2021-02-11 ENCOUNTER — Other Ambulatory Visit: Payer: Self-pay

## 2021-02-11 ENCOUNTER — Encounter: Payer: Self-pay | Admitting: Registered Nurse

## 2021-02-11 ENCOUNTER — Ambulatory Visit: Payer: BC Managed Care – PPO | Admitting: Registered Nurse

## 2021-02-11 VITALS — BP 128/62 | HR 62 | Temp 98.3°F | Resp 18 | Ht 68.0 in | Wt 232.6 lb

## 2021-02-11 DIAGNOSIS — Z23 Encounter for immunization: Secondary | ICD-10-CM

## 2021-02-11 DIAGNOSIS — E038 Other specified hypothyroidism: Secondary | ICD-10-CM | POA: Diagnosis not present

## 2021-02-11 DIAGNOSIS — E673 Hypervitaminosis D: Secondary | ICD-10-CM

## 2021-02-11 DIAGNOSIS — R748 Abnormal levels of other serum enzymes: Secondary | ICD-10-CM

## 2021-02-11 DIAGNOSIS — F419 Anxiety disorder, unspecified: Secondary | ICD-10-CM

## 2021-02-11 DIAGNOSIS — G43709 Chronic migraine without aura, not intractable, without status migrainosus: Secondary | ICD-10-CM

## 2021-02-11 MED ORDER — TOPIRAMATE 100 MG PO TABS: 100.0000 mg | ORAL_TABLET | Freq: Every day | ORAL | 1 refills | Status: DC

## 2021-02-11 MED ORDER — LEVOTHYROXINE SODIUM 88 MCG PO TABS: 88.0000 ug | ORAL_TABLET | Freq: Every day | ORAL | 1 refills | Status: DC

## 2021-02-11 MED ORDER — CITALOPRAM HYDROBROMIDE 40 MG PO TABS: 40.0000 mg | ORAL_TABLET | Freq: Every day | ORAL | 1 refills | Status: DC

## 2021-02-11 NOTE — Progress Notes (Signed)
Established Patient Office Visit  Subjective:  Patient ID: Melanie [name], female    DOB: September 05, 1975  Age: 45 y.o. MRN: [id number]  CC:  Chief Complaint  Patient presents with   Follow-up    Patient states she is following up to discuss labs .    HPI [name] presents for review of lab work  Recent labs drawn 01/04/21  Noted GFR low at 55 but improved from 38 in March. Vitamin D high to 114. Had been on supplementation, has reduced this.  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    Migraines    LIGHT SENSITIVITY , NOW ON IMITREX SINCE 08-02-2018,NOTICES IMPROVEMENT    Multiple food allergies    Muscle pain    Numbness and tingling in both hands    Pre-diabetes    Shortness of breath    was due to a medication side effect, now off medication , SOB resolved    Sleep apnea    CPAP   Stress    Swelling of both lower extremities     Past Surgical History:  Procedure Laterality Date   ELBOW LIGAMENT RECONSTRUCTION Right 08/21/2018   Procedure: LATERAL COLLATERAL LIGAMENT REPAIR;  Surgeon: Bjorn Pippin, MD;  Location: [facility];  Service: Orthopedics;  Laterality: Right;   FINGER SURGERY Left     index finger   KNEE SURGERY Left    LAPAROSCOPIC GASTRIC SLEEVE RESECTION N/A 05/31/2020   Procedure: LAPAROSCOPIC GASTRIC SLEEVE RESECTION;  Surgeon: Sheliah Hatch, De Blanch, MD;  Location: [facility];  Service: General;  Laterality: N/A;  2 HOURS TOTAL ROOM 1   LIVER BIOPSY  N/A 05/15/2014   Procedure: LIVER BIOPSY;  Surgeon: Louis Meckel, MD;  Location: [facility];  Service: Endoscopy;  Laterality: N/A;  ultrasound to mark the liver   TENNIS ELBOW RELEASE/NIRSCHEL PROCEDURE Right 08/21/2018   Procedure: TENNIS ELBOW RELEASE/NIRSCHEL PROCEDURE;  Surgeon: Bjorn Pippin, MD;  Location: [facility];  Service: Orthopedics;  Laterality: Right;   TONSILLECTOMY AND ADENOIDECTOMY  1978   TYMPANOSTOMY TUBE PLACEMENT     ULNAR NERVE TRANSPOSITION Right 08/21/2018   Procedure: ULNAR NERVE DECOMPRESSION/TRANSPOSITION;  Surgeon: Bjorn Pippin, MD;  Location: [facility];  Service: Orthopedics;  Laterality: Right;   UPPER GI ENDOSCOPY N/A 05/31/2020   Procedure: UPPER GI ENDOSCOPY;  Surgeon: Sheliah Hatch, De Blanch, MD;  Location: [facility];  Service: General;  Laterality: N/A;   wrist cyst Left     Family History  Problem Relation Age of Onset   Diabetes Mother    Liver disease Mother        ESLD- unknown etiology   Obesity Mother    Kidney disease Mother    Diabetes Maternal Grandmother    Diabetes Paternal Grandmother    Breast cancer Paternal Grandmother  Stroke Maternal Grandfather    Hyperlipidemia Father     Social History   Socioeconomic History   Marital status: Married    Spouse name: [name]   Number of children: 5   Years of education: in [employer] program   Highest education level: Bachelor's degree (e.g., BA, AB, BS)  Occupational History   Occupation: Magazine features editor: [employer]    Comment: ESL  Tobacco Use   Smoking status: Never   Smokeless tobacco: Never  Vaping Use   Vaping Use: Never used  Substance and Sexual Activity   Alcohol use: No    Alcohol/week: 0.0 standard drinks   Drug use: No   Sexual activity: Yes    Partners: Male    Birth control/protection: None    Comment: 1st intercourse 28yo-1 partner  Other Topics Concern   Not on file  Social History Narrative   Lives with her husband and their 5 adpoted children.     Adopted 2 sets of siblings, all with psychiatric diagnoses (ADHD, Asperger's, ODD, PTSD, depression, bipolar disorder).   Her husband has Asperger's, and works in disability services.   Occasionally drinks coke or pepsi    Social Determinants of Health   Financial Resource Strain: Not on file  Food Insecurity: Not on file  Transportation Needs: Not on file  Physical Activity: Not on file  Stress: Not on file  Social Connections: Not on file  Intimate Partner Violence: Not on file    Outpatient Medications Prior to Visit  Medication Sig Dispense Refill   buPROPion (WELLBUTRIN SR) 150 MG 12 hr tablet Take 1 tablet (150 mg total) by mouth 2 (two) times daily. 180 tablet 1   busPIRone (BUSPAR) 15 MG tablet Take 1 tablet (15 mg total) by mouth 2 (two) times daily. 180 tablet 3   cetirizine (ZYRTEC) 10 MG tablet Take 10 mg by mouth at bedtime.      cholecalciferol (VITAMIN D) 25 MCG (1000 UNIT) tablet Take 4,000 Units by mouth daily.     fluticasone (FLONASE) 50 MCG/ACT nasal spray Place 2 sprays into both nostrils daily. 16 g 3   montelukast (SINGULAIR) 10 MG tablet Take 1 tablet (10 mg total) by mouth at bedtime. 90 tablet 3   Multiple Vitamin (MULTIVITAMIN WITH MINERALS) TABS tablet Take 1 tablet by mouth daily.     Omega-3 Fatty Acids (FISH OIL) 1200 MG CAPS Take 1,200 mg by mouth in the morning and at bedtime.     omeprazole (PRILOSEC) 20 MG capsule Take 1 capsule (20 mg total) by mouth daily. 90 capsule 0   ondansetron (ZOFRAN-ODT) 4 MG disintegrating tablet DISSOLVE 1 TABLET BY MOUTH EVERY 6 HOURS AS NEEDED FOR NAUSEA OR VOMITING 20 tablet 0   Pitavastatin Calcium 2 MG TABS Take 1 tablet (2 mg total) by mouth daily. 30 tablet 11   Polyethyl Glycol-Propyl Glycol (LUBRICANT EYE DROPS) 0.4-0.3 % SOLN Place 1 drop into both eyes 3 (three) times daily as needed (dry/irritated eyes).     Ubrogepant (UBRELVY) 50 MG TABS Take 50 mg by mouth every 2 (two) hours as needed (may repeat once after 2  hours). 30 tablet 2   citalopram (CELEXA) 40 MG tablet TAKE ONE TABLET BY MOUTH DAILY 90 tablet 0   levothyroxine (SYNTHROID) 88 MCG tablet Take 1 tablet (88 mcg total) by mouth daily before breakfast. 90 tablet 0   LORazepam (ATIVAN) 1 MG tablet TAKE ONE TABLET BY MOUTH TWICE A DAY AS NEEDED  FOR ANXIETY 20 tablet 0   metFORMIN (GLUCOPHAGE) 500 MG tablet TAKE ONE TABLET BY MOUTH TWICE A DAY 180 tablet 0   PRALUENT 150 MG/ML SOAJ INJECT 150 MG ( ) INTO THE SKIN EVERY 14 DAYS **MUST CALL MD FOR APPOINTMENT 2 mL 0   topiramate (TOPAMAX) 100 MG tablet Take 1 tablet (100 mg total) by mouth at bedtime. 90 tablet 0   Vitamin D, Ergocalciferol, (DRISDOL) 1.25 MG (50000 UNIT) CAPS capsule Take 1 capsule (50,000 Units total) by mouth every 7 (seven) days. One tab every 7 days (Patient not taking: Reported on 02/11/2021) 12 capsule 3   No facility-administered medications prior to visit.    Allergies  Allergen Reactions   Welchol [Colesevelam] Shortness Of Breath   Lidocaine Other (See Comments)    Flu like sx's   Pistachio Nut (Diagnostic) Itching and Swelling    Swollen tongue/itchy mouth   Relafen [Nabumetone] Other (See Comments)    migraine   Celebrex [Celecoxib] Rash   Sulfa Antibiotics Rash    ROS Review of Systems    Objective:    Physical Exam  BP 128/62   Pulse 62   Temp 98.3 F (36.8 C) (Temporal)   Resp 18   Ht 5\' 8"  (1.727 m)   Wt 232 lb 9.6 oz (105.5 kg)   SpO2 100%   BMI 35.37 kg/m  Wt Readings from Last 3 Encounters:  03/24/21 240 lb 9.6 oz (109.1 kg)  02/11/21 232 lb 9.6 oz (105.5 kg)  07/27/20 257 lb (116.6 kg)     Health Maintenance Due  Topic Date Due   COVID-19 Vaccine (3 - Booster for Pfizer series) 11/08/2019   URINE MICROALBUMIN  11/09/2020    There are no preventive care reminders to display for this patient.  Lab Results  Component Value Date   TSH 3.42 01/04/2021   Lab Results  Component Value Date   WBC 4.5 01/04/2021   HGB 13.1  01/04/2021   HCT 38.4 01/04/2021   MCV 91.2 01/04/2021   PLT 186.0 01/04/2021   Lab Results  Component Value Date   NA 141 02/11/2021   K 4.0 02/11/2021   CO2 26 02/11/2021   GLUCOSE 84 02/11/2021   BUN 18 02/11/2021   CREATININE 1.11 (H) 02/11/2021   BILITOT 0.9 02/11/2021   ALKPHOS 155 (H) 01/04/2021   AST 24 02/11/2021   ALT 39 (H) 02/11/2021   PROT 6.0 (L) 02/11/2021   ALBUMIN 3.9 01/04/2021   CALCIUM 10.7 (H) 02/11/2021   ANIONGAP 9 06/01/2020   GFR 55.27 (L) 01/04/2021   Lab Results  Component Value Date   CHOL 243 (H) 01/04/2021   Lab Results  Component Value Date   HDL 68.30 01/04/2021   Lab Results  Component Value Date   LDLCALC 157 (H) 01/04/2021   Lab Results  Component Value Date   TRIG 91.0 01/04/2021   Lab Results  Component Value Date   CHOLHDL 4 01/04/2021   Lab Results  Component Value Date   HGBA1C 4.7 01/04/2021      Assessment & Plan:   Problem List Items Addressed This Visit   None Visit Diagnoses     High vitamin D level    -  Primary   Relevant Orders   Vitamin D (25 hydroxy) (Completed)   Elevated liver enzymes       Relevant Orders   Comprehensive metabolic panel (Completed)   Flu vaccine need       Relevant Orders  Flu Vaccine QUAD 6+ mos PF IM (Fluarix Quad PF) (Completed)   Need for pneumococcal vaccine       Relevant Orders   Pneumococcal conjugate vaccine 20-valent (Prevnar 20) (Completed)   Other specified hypothyroidism       Relevant Medications   levothyroxine (SYNTHROID) 88 MCG tablet   Chronic migraine without aura without status migrainosus, not intractable       Relevant Medications   topiramate (TOPAMAX) 100 MG tablet   citalopram (CELEXA) 40 MG tablet   Anxiety       Relevant Medications   citalopram (CELEXA) 40 MG tablet       Meds ordered this encounter  Medications   levothyroxine (SYNTHROID) 88 MCG tablet    Sig: Take 1 tablet (88 mcg total) by mouth daily before breakfast.    Dispense:   90 tablet    Refill:  1    Order Specific Question:   Supervising Provider    Answer:   Neva Seat, JEFFREY R [[id number]]   topiramate (TOPAMAX) 100 MG tablet    Sig: Take 1 tablet (100 mg total) by mouth at bedtime.    Dispense:  90 tablet    Refill:  1    Order Specific Question:   Supervising Provider    Answer:   Neva Seat, JEFFREY R [[id number]]   citalopram (CELEXA) 40 MG tablet    Sig: Take 1 tablet (40 mg total) by mouth daily.    Dispense:  90 tablet    Refill:  1    Order Specific Question:   Supervising Provider    Answer:   Neva Seat, JEFFREY R [[id number]]    Follow-up: No follow-ups on file.   PLAN Refill synthroid, topamax, celexa. Labs collected. Will follow up with the patient as warranted. Return per labs Flu and pna vaccines given. Patient encouraged to call clinic with any questions, comments, or concerns.  [name], NP

## 2021-02-11 NOTE — Patient Instructions (Signed)
° ° ° °  If you have lab work done today you will be contacted with your lab results within the next 2 weeks.  If you have not heard from us then please contact us. The fastest way to get your results is to register for My Chart. ° ° °IF you received an x-ray today, you will receive an invoice from [facility]. Please contact [facility] at [phone] with questions or concerns regarding your invoice.  ° °IF you received labwork today, you will receive an invoice from [facility]. Please contact [facility] at [phone] with questions or concerns regarding your invoice.  ° °Our billing staff will not be able to assist you with questions regarding bills from these companies. ° °You will be contacted with the lab results as soon as they are available. The fastest way to get your results is to activate your My Chart account. Instructions are located on the last page of this paperwork. If you have not heard from us regarding the results in 2 weeks, please contact this office. °  ° ° ° °

## 2021-02-12 LAB — COMPREHENSIVE METABOLIC PANEL
AG Ratio: 1.7 (calc) (ref 1.0–2.5)
ALT: 39 U/L — ABNORMAL HIGH (ref 6–29)
AST: 24 U/L (ref 10–35)
Albumin: 3.8 g/dL (ref 3.6–5.1)
Alkaline phosphatase (APISO): 135 U/L — ABNORMAL HIGH (ref 31–125)
BUN/Creatinine Ratio: 16 (calc) (ref 6–22)
BUN: 18 mg/dL (ref 7–25)
CO2: 26 mmol/L (ref 20–32)
Calcium: 10.7 mg/dL — ABNORMAL HIGH (ref 8.6–10.2)
Chloride: 106 mmol/L (ref 98–110)
Creat: 1.11 mg/dL — ABNORMAL HIGH (ref 0.50–0.99)
Globulin: 2.2 g/dL (calc) (ref 1.9–3.7)
Glucose, Bld: 84 mg/dL (ref 65–99)
Potassium: 4 mmol/L (ref 3.5–5.3)
Sodium: 141 mmol/L (ref 135–146)
Total Bilirubin: 0.9 mg/dL (ref 0.2–1.2)
Total Protein: 6 g/dL — ABNORMAL LOW (ref 6.1–8.1)

## 2021-02-12 LAB — VITAMIN D 25 HYDROXY (VIT D DEFICIENCY, FRACTURES): Vit D, 25-Hydroxy: 84 ng/mL (ref 30–100)

## 2021-02-15 ENCOUNTER — Other Ambulatory Visit: Payer: Self-pay | Admitting: Internal Medicine

## 2021-02-15 ENCOUNTER — Other Ambulatory Visit: Payer: Self-pay | Admitting: Registered Nurse

## 2021-02-15 DIAGNOSIS — F418 Other specified anxiety disorders: Secondary | ICD-10-CM

## 2021-03-22 ENCOUNTER — Encounter: Payer: Self-pay | Admitting: Registered Nurse

## 2021-03-24 ENCOUNTER — Ambulatory Visit: Payer: BC Managed Care – PPO | Admitting: Family Medicine

## 2021-03-24 ENCOUNTER — Other Ambulatory Visit: Payer: Self-pay

## 2021-03-24 ENCOUNTER — Ambulatory Visit (INDEPENDENT_AMBULATORY_CARE_PROVIDER_SITE_OTHER)
Admission: RE | Admit: 2021-03-24 | Discharge: 2021-03-24 | Disposition: A | Discharge status: Home / Self Care | Payer: BC Managed Care – PPO | Source: Ambulatory Visit | Attending: Family Medicine | Admitting: Family Medicine

## 2021-03-24 ENCOUNTER — Encounter: Payer: Self-pay | Admitting: Family Medicine

## 2021-03-24 VITALS — BP 128/78 | HR 78 | Temp 98.1°F | Resp 17 | Ht 68.0 in | Wt 240.6 lb

## 2021-03-24 DIAGNOSIS — G8929 Other chronic pain: Secondary | ICD-10-CM | POA: Diagnosis not present

## 2021-03-24 DIAGNOSIS — Z8669 Personal history of other diseases of the nervous system and sense organs: Secondary | ICD-10-CM | POA: Diagnosis not present

## 2021-03-24 DIAGNOSIS — Z9181 History of falling: Secondary | ICD-10-CM

## 2021-03-24 DIAGNOSIS — M25562 Pain in left knee: Secondary | ICD-10-CM

## 2021-03-24 DIAGNOSIS — M542 Cervicalgia: Secondary | ICD-10-CM

## 2021-03-24 MED ORDER — DICLOFENAC SODIUM 1 % EX GEL: 4.0000 g | Freq: Four times a day (QID) | CUTANEOUS | 1 refills | Status: DC

## 2021-03-24 MED ORDER — CYCLOBENZAPRINE HCL 5 MG PO TABS: 5.0000 mg | ORAL_TABLET | Freq: Three times a day (TID) | ORAL | 1 refills | Status: DC | PRN

## 2021-03-24 NOTE — Progress Notes (Signed)
Subjective:  Patient ID: Melanie [name], female    DOB: Mar 14, 1975  Age: 46 y.o. MRN: [id number]  CC:  Chief Complaint  Patient presents with   Neck Pain    Pt reports neck pain starting December, pt fell backwards out of rocking chair display, reports has been hurting since and would like evaluated    Knee Pain    Pt reports also Lt knee painful starting today as well, reports had accident several years ago and has intermittent pain since     HPI Melanie [name] presents for   Neck pain Noted since fall backwards out of a rocking chair in December. 03/10/21. No head injury. Chair hit floor. In store when occurred. No immediate pain. Neck sore about 30 min later. Base of neck both sides some HA in back of head to front with head movement.  No n/v, vision loss, weakness.  Has not had medical eval or imaging. Feels worse, with worse headache - pain into head with neck movement. Pain on left side of head when leaning forward, limited in extension.  Attempted treatments: tylenol  - min effect.  No arm weakness or radiation of pain  No prior neck surgery or injury known. History of headache d/o, migraine.  Left knee pain Intermittent pain since surgery at age 19 after accident.  No imaging noted recently. Past year some soreness, noise - crunching. More sore today, no recent injury. No locking. Some decreased ROM at times.  No treatment, but has used topical rub in past with relief.  No ortho.    Allergies noted including sulfa and Celebrex with rash, Relafen causing migraine, prior gastric sleeve surgery.  History Patient Active Problem List   Diagnosis Date Noted   Morbid obesity (HCC) 05/31/2020   Vitamin D deficiency 12/26/2018   Class 3 severe obesity with serious comorbidity and body mass index (BMI) of 45.0 to 49.9 in adult [facility]) 12/26/2018   Right tennis elbow 07/30/2018   Cubital tunnel syndrome 07/30/2018   Chronic pain of both feet 01/23/2018   Bilateral calcaneal spurs  01/23/2018   Prediabetes 05/23/2017   Liver fibrosis 05/23/2017   Hyperlipidemia 04/16/2017   OSA (obstructive sleep apnea) 11/23/2015   NASH (nonalcoholic steatohepatitis) 08/27/2015   Dependent edema 08/27/2015   BMI 40.0-44.9, adult (HCC) 03/15/2015   Hepatic fibrosis 06/08/2014   Depression with anxiety 06/06/2013   Perennial allergic rhinitis 06/06/2013   Past Medical History:  Diagnosis Date   Allergy    Anemia    Anxiety    Arthritis    Back pain    Constipation    COVID-19 05/27/2019   Depression    Fatty liver    GERD (gastroesophageal reflux disease)    Hyperlipidemia    Hyperthyroidism    Infertility, female    Joint pain    Kidney problem    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    Migraines    LIGHT SENSITIVITY , NOW ON IMITREX SINCE 08-02-2018,NOTICES IMPROVEMENT    Multiple food allergies    Muscle pain    Numbness and tingling in both hands    Pre-diabetes    Shortness of breath    was due to a medication side effect, now off medication , SOB resolved    Sleep apnea    CPAP   Stress    Swelling of both lower extremities    Past Surgical History:  Procedure Laterality Date   ELBOW LIGAMENT RECONSTRUCTION Right  08/21/2018   Procedure: LATERAL COLLATERAL LIGAMENT REPAIR;  Surgeon: Bjorn Pippin, MD;  Location: [facility];  Service: Orthopedics;  Laterality: Right;   FINGER SURGERY Left     index finger   KNEE SURGERY Left    LAPAROSCOPIC GASTRIC SLEEVE RESECTION N/A 05/31/2020   Procedure: LAPAROSCOPIC GASTRIC SLEEVE RESECTION;  Surgeon: Sheliah Hatch, De Blanch, MD;  Location: [facility];  Service: General;  Laterality: N/A;  2 HOURS TOTAL ROOM 1   LIVER BIOPSY N/A 05/15/2014   Procedure: LIVER BIOPSY;  Surgeon: Louis Meckel, MD;  Location: [facility];  Service: Endoscopy;  Laterality: N/A;  ultrasound to mark the liver   TENNIS ELBOW RELEASE/NIRSCHEL PROCEDURE Right 08/21/2018   Procedure: TENNIS ELBOW RELEASE/NIRSCHEL PROCEDURE;  Surgeon:  Bjorn Pippin, MD;  Location: [facility];  Service: Orthopedics;  Laterality: Right;   TONSILLECTOMY AND ADENOIDECTOMY  1978   TYMPANOSTOMY TUBE PLACEMENT     ULNAR NERVE TRANSPOSITION Right 08/21/2018   Procedure: ULNAR NERVE DECOMPRESSION/TRANSPOSITION;  Surgeon: Bjorn Pippin, MD;  Location: [facility];  Service: Orthopedics;  Laterality: Right;   UPPER GI ENDOSCOPY N/A 05/31/2020   Procedure: UPPER GI ENDOSCOPY;  Surgeon: Sheliah Hatch, De Blanch, MD;  Location: [facility];  Service: General;  Laterality: N/A;   wrist cyst Left    Allergies  Allergen Reactions   Welchol [Colesevelam] Shortness Of Breath   Lidocaine Other (See Comments)    Flu like sx's   Pistachio Nut (Diagnostic) Itching and Swelling    Swollen tongue/itchy mouth   Relafen [Nabumetone] Other (See Comments)    migraine   Celebrex [Celecoxib] Rash   Sulfa Antibiotics Rash   Prior to Admission medications   Medication Sig Start Date End Date Taking? Authorizing Provider  buPROPion (WELLBUTRIN SR) 150 MG 12 hr tablet Take 1 tablet (150 mg total) by mouth 2 (two) times daily. 11/30/20  Yes [name], NP  busPIRone (BUSPAR) 15 MG tablet Take 1 tablet (15 mg total) by mouth 2 (two) times daily. 05/21/20  Yes [name], NP  cetirizine (ZYRTEC) 10 MG tablet Take 10 mg by mouth at bedtime.    Yes [provider]  cholecalciferol (VITAMIN D) 25 MCG (1000 UNIT) tablet Take 4,000 Units by mouth daily.   Yes [provider]  citalopram (CELEXA) 40 MG tablet Take 1 tablet (40 mg total) by mouth daily. 02/11/21  Yes [name], NP  fluticasone (FLONASE) 50 MCG/ACT nasal spray Place 2 sprays into both nostrils daily. 05/21/20  Yes [name], NP  levothyroxine (SYNTHROID) 88 MCG tablet Take 1 tablet (88 mcg total) by mouth daily before breakfast. 02/11/21  Yes [name], NP  LORazepam (ATIVAN) 1 MG tablet TAKE ONE TABLET BY MOUTH TWICE A DAY AS NEEDED FOR ANXIETY 02/16/21  Yes [name], NP  metFORMIN  (GLUCOPHAGE) 500 MG tablet TAKE ONE TABLET BY MOUTH TWICE A DAY 12/27/20  Yes [name], NP  montelukast (SINGULAIR) 10 MG tablet Take 1 tablet (10 mg total) by mouth at bedtime. 05/21/20  Yes [name], NP  Multiple Vitamin (MULTIVITAMIN WITH MINERALS) TABS tablet Take 1 tablet by mouth daily.   Yes [provider]  Omega-3 Fatty Acids (FISH OIL) 1200 MG CAPS Take 1,200 mg by mouth in the morning and at bedtime.   Yes [provider]  omeprazole (PRILOSEC) 20 MG capsule Take 1 capsule (20 mg total) by mouth daily. 05/21/20  Yes [name], NP  ondansetron (ZOFRAN-ODT) 4 MG disintegrating tablet DISSOLVE 1 TABLET BY MOUTH  EVERY 6 HOURS AS NEEDED FOR NAUSEA OR VOMITING 06/02/20 06/02/21 Yes Kinsinger, De Blanch, MD  Pitavastatin Calcium 2 MG TABS Take 1 tablet (2 mg total) by mouth daily. 05/21/20  Yes [name], NP  Polyethyl Glycol-Propyl Glycol (LUBRICANT EYE DROPS) 0.4-0.3 % SOLN Place 1 drop into both eyes 3 (three) times daily as needed (dry/irritated eyes).   Yes [provider]  PRALUENT 150 MG/ML SOAJ INJECT 150MG S INTO THE SKIN EVERY 14 DAYS 02/22/21  Yes Hilty, Lisette Abu, MD  topiramate (TOPAMAX) 100 MG tablet Take 1 tablet (100 mg total) by mouth at bedtime. 02/11/21  Yes [name], NP  Ubrogepant (UBRELVY) 50 MG TABS Take 50 mg by mouth every 2 (two) hours as needed (may repeat once after 2 hours). 05/21/20  Yes [name], NP   Social History   Socioeconomic History   Marital status: Married    Spouse name: [name]   Number of children: 5   Years of education: in [place] program   Highest education level: Bachelor's degree (e.g., BA, AB, BS)  Occupational History   Occupation: Magazine features editor: [employer]    Comment: ESL  Tobacco Use   Smoking status: Never   Smokeless tobacco: Never  Vaping Use   Vaping Use: Never used  Substance and Sexual Activity   Alcohol use: No    Alcohol/week: 0.0 standard drinks    Drug use: No   Sexual activity: Yes    Partners: Male    Birth control/protection: None    Comment: 1st intercourse 28yo-1 partner  Other Topics Concern   Not on file  Social History Narrative   Lives with her husband and their 5 adpoted children.    Adopted 2 sets of siblings, all with psychiatric diagnoses (ADHD, Asperger's, ODD, PTSD, depression, bipolar disorder).   Her husband has Asperger's, and works in disability services.   Occasionally drinks coke or pepsi    Social Determinants of Health   Financial Resource Strain: Not on file  Food Insecurity: Not on file  Transportation Needs: Not on file  Physical Activity: Not on file  Stress: Not on file  Social Connections: Not on file  Intimate Partner Violence: Not on file    Review of Systems Per HPI.   Objective:   Vitals:   03/24/21 0910  BP: 128/78  Pulse: 78  Resp: 17  Temp: 98.1 F (36.7 C)  TempSrc: Temporal  SpO2: 97%  Weight: 240 lb 9.6 oz (109.1 kg)  Height: 5\' 8"  (1.727 m)     Physical Exam Vitals reviewed.  Constitutional:      General: She is not in acute distress.    Appearance: Normal appearance. She is well-developed.  HENT:     Head: Normocephalic and atraumatic.     Comments: No hemotympanum, negative battle sign, no raccoon eyes Eyes:     Extraocular Movements: Extraocular movements intact.     Pupils: Pupils are equal, round, and reactive to light.     Comments: No nystagmus  Cardiovascular:     Rate and Rhythm: Normal rate.  Pulmonary:     Effort: Pulmonary effort is normal.  Musculoskeletal:     Comments: Scalp, no focal tenderness, scalp abrasion, or rash. C-spine, diffuse tenderness across mid C-spine, upper aspect, but primarily sore over the paraspinals bilaterally.  Some spasm. Strength intact upper extremities bilaterally.  Diminished C-spine range of motion, diffusely, primarily with extension.   Left knee with full extension, flexion to approximately  80 degrees, no  apparent effusion.  Skin intact without erythema.  Slight tenderness over the medial joint line.  Negative varus/valgus/drawer.   Neurological:     Mental Status: She is alert and oriented to person, place, and time.     GCS: GCS eye subscore is 4. GCS verbal subscore is 5. GCS motor subscore is 6.     Cranial Nerves: No cranial nerve deficit, dysarthria or facial asymmetry.     Motor: No pronator drift.     Coordination: Coordination is intact.     Gait: Gait is intact.     Comments: No focal weakness.  Psychiatric:        Mood and Affect: Mood normal.        Behavior: Behavior normal.       Assessment & Plan:  Melanie [name] is a 46 y.o. female . Neck pain History of fall- Plan: cyclobenzaprine (FLEXERIL) 5 MG tablet, DG Cervical Spine Complete  -Pain/sprain with spasm likely.  Check imaging, but unlikely fracture.  No radiation to arms, no weakness.  Trial of Flexeril potential side effects discussed, heat or ice, Tylenol, RTC precautions.  History of migraine  -If flare of headaches with migraine versus cervicogenic with neck pain.  Treatment with Tylenol, Flexeril as above, ER precautions but based on no specific head injury and exam today deferred imaging.  Patient in agreement with plan.  Chronic pain of left knee - Plan: diclofenac Sodium (VOLTAREN) 1 % GEL, DG Knee 1-2 Views Left  -Chronic pain with intermittent flares.  Option to try small patch of Voltaren gel to make sure she does not have a rash or reaction, then if tolerated can apply 4 times daily as needed.  Avoid oral NSAIDs given her medication allergies and history of gastric sleeve.  Check imaging, consider Ortho eval if persistent/recurrent pain.   Meds ordered this encounter  Medications   diclofenac Sodium (VOLTAREN) 1 % GEL    Sig: Apply 4 g topically 4 (four) times daily.    Dispense:  100 g    Refill:  1   cyclobenzaprine (FLEXERIL) 5 MG tablet    Sig: Take 1 tablet (5 mg total) by mouth 3 (three) times  daily as needed for muscle spasms (start qhs prn due to sedation).    Dispense:  15 tablet    Refill:  1   Patient Instructions  I suspect you have a sprain or strain of the neck which has caused some spasm in the muscles, and likely contributing to some of the headache.  Please have x-ray performed at the [facility] location.  Can try the muscle relaxant Flexeril up to every 8 hours but that can cause sedation so initially recommend that at bedtime.  Heat or ice to the area may be helpful, Tylenol is fine to continue for pain.  I expect symptoms to be improving into next week.  If any worsening or worsening headache please be seen sooner. Return to the clinic or go to the nearest emergency room if any of your symptoms worsen or new symptoms occur.  Can try a small amount of the diclofenac topical to make sure you do not have a rash or reaction to it.  If tolerated, then can apply to your left knee up to 4 times per day during knee pain flare.  If you continue to have issues with your knee I would recommend follow-up with orthopedist, but we will also check x-ray.  Let me know if  there are questions.  Hope you feel better soon.  Thanks for coming in today.    Signed,   Meredith Staggers, MD [facility], [facility] 03/24/21 9:51 AM

## 2021-03-24 NOTE — Patient Instructions (Signed)
I suspect you have a sprain or strain of the neck which has caused some spasm in the muscles, and likely contributing to some of the headache.  Please have x-ray performed at the [facility] location.  Can try the muscle relaxant Flexeril up to every 8 hours but that can cause sedation so initially recommend that at bedtime.  Heat or ice to the area may be helpful, Tylenol is fine to continue for pain.  I expect symptoms to be improving into next week.  If any worsening or worsening headache please be seen sooner. Return to the clinic or go to the nearest emergency room if any of your symptoms worsen or new symptoms occur.  Can try a small amount of the diclofenac topical to make sure you do not have a rash or reaction to it.  If tolerated, then can apply to your left knee up to 4 times per day during knee pain flare.  If you continue to have issues with your knee I would recommend follow-up with orthopedist, but we will also check x-ray.  Let me know if there are questions.  Hope you feel better soon.  Thanks for coming in today.

## 2021-03-25 ENCOUNTER — Ambulatory Visit: Payer: BC Managed Care – PPO | Admitting: Registered Nurse

## 2021-03-29 ENCOUNTER — Other Ambulatory Visit: Payer: Self-pay | Admitting: Registered Nurse

## 2021-03-29 DIAGNOSIS — R7303 Prediabetes: Secondary | ICD-10-CM

## 2021-04-04 ENCOUNTER — Other Ambulatory Visit: Payer: Self-pay | Admitting: Family Medicine

## 2021-04-04 DIAGNOSIS — M25562 Pain in left knee: Secondary | ICD-10-CM

## 2021-04-04 DIAGNOSIS — G8929 Other chronic pain: Secondary | ICD-10-CM

## 2021-04-28 ENCOUNTER — Encounter: Payer: BC Managed Care – PPO | Attending: General Surgery | Admitting: Skilled Nursing Facility1

## 2021-04-28 DIAGNOSIS — Z6841 Body Mass Index (BMI) 40.0 and over, adult: Secondary | ICD-10-CM | POA: Insufficient documentation

## 2021-04-28 NOTE — Progress Notes (Signed)
Follow-up visit:  Post-Operative sleeve gastrectomy Surgery   Primary concerns today: Post-operative Bariatric Surgery Nutrition Management   Anthropometrics  Surgery date: 05/31/2020 Surgery type: sleeve Start weight at [facility]: 324 pounds Weight today: virtual appt; pt identified by name and DOB, pt agreeable to limitations of this visit type  Body Composition Scale 06/16/2020 07/27/2020  Current Body Weight 273.1 257  Total Body Fat % 45.2 43.6  Visceral Fat 15   Fat-Free Mass % 54.7    Total Body Water % 41.8 42.6  Muscle-Mass lbs 34.8   BMI 41.5   Body Fat Displacement           Torso  lbs 76.6          Left Leg  lbs 15.3          Right Leg  lbs 15.3          Left Arm  lbs 7.6          Right Arm   lbs 7.6    Clinical  Medical hx: prediabetes  Medications: see list: was taking a over the counter vitamin D plus a D prescription streangth plus what was in her multivitamin: multivitamins and calcium Labs: A1C 4.7, alkaline phosphatase 155, ALT 40, Calcium 10.8, GFR 55.27, B12 over 1550 (having taken supplements before going), vitamin D 84, cholesterol 243, LDL 157 Notable signs/symptoms: migraines, neck pain Any previous deficiencies? Iron-Anemia when teenager   Pt states she has had some headaches recently from muscular pain in her neck after a fall. Pt states she has been dealing with a lot of stress and feeling very overwhelmed: pt states she will start working with a  therapist. Pt states she is vexed her weight is at a plateau and does recognize it is from emotionally eating on excess sugar foods.   24 hr recall: First meal: yogurt or cheese and nuts or protein oatmeal Snack: luna bar or p3 nuts and cranberries  Second meal: cheese + chicken or beef + green beans or spinach or frozen meal  Snack: Third meal: chicken and vegetables or edamame  Snack:  Beverages: water, diet soda  Fluid intake: 40 ounces   Medications: See List Supplementation: multi and  calcium   Using straws: no Drinking while eating: no Having you been chewing well: yes Chewing/swallowing difficulties: no Changes in vision: no Changes to mood/headaches: no Hair loss/Cahnges to skin/Changes to nails: no Any difficulty focusing or concentrating: no Sweating: no Dizziness/Lightheaded: no Palpitations: no  Carbonated beverages: yes N/V/D/C/GAS: no Abdominal Pain: no Dumping syndrome: no  Recent physical activity:  ADL's  Education Topics: Educated pt on the nutrition facts label  Encouraged patient to honor their body's internal hunger and fullness cues.  Throughout the day, check in mentally and rate hunger. Stop eating when satisfied not full regardless of how much food is left on the plate.  Get more if still hungry 20-30 minutes later.  The key is to honor satisfaction so throughout the meal, rate fullness factor and stop when comfortably satisfied not physically full. The key is to honor hunger and fullness without any feelings of guilt or shame.  Pay attention to what the internal cues are, rather than any external factors. This will enhance the confidence you have in listening to your own body and following those internal cues enabling you to increase how often you eat when you are hungry not out of appetite and stop when you are satisfied not full.  Encouraged pt to continue  to eat balanced meals inclusive of non starchy vegetables 2 times a day 7 days a week Encouraged pt to choose lean protein sources: limiting beef, pork, sausage, hotdogs, and lunch meat Encourage pt to choose healthy fats such as plant based limiting animal fats Encouraged pt to continue to drink a minium 64 fluid ounces with half being plain water to satisfy proper hydration  Importance of vegetables To have an overall healthy diet, adult men and women are recommended to consume anywhere from 2-3 cups of vegetables daily. Vegetables provide a wide range of vitamins and minerals such as  vitamin A, vitamin C, potassium, and folic acid. According to the [facility], including fruit and vegetables daily may reduce the risk of cardiovascular disease, certain cancers, and other non-communicable diseases.   Progress Towards Goal(s):  In Progress Teaching method utilized: Environmental health practitioner & Auditory  Demonstrated degree of understanding via: Teach Back  Readiness Level: Action Barriers to learning/adherence to lifestyle change: none identified  Teaching Method Utilized:  Visual Auditory Hands on  Demonstrated degree of understanding via:  Teach Back   Monitoring/Evaluation:  Dietary intake, exercise, and body weight.  Pt states he does not wish to make an appt at this time feeling she is doing well

## 2021-06-16 ENCOUNTER — Other Ambulatory Visit: Payer: Self-pay | Admitting: Registered Nurse

## 2021-06-16 DIAGNOSIS — F3289 Other specified depressive episodes: Secondary | ICD-10-CM

## 2021-06-27 ENCOUNTER — Other Ambulatory Visit: Payer: Self-pay | Admitting: Registered Nurse

## 2021-06-27 DIAGNOSIS — T7840XS Allergy, unspecified, sequela: Secondary | ICD-10-CM

## 2021-06-29 ENCOUNTER — Other Ambulatory Visit: Payer: Self-pay | Admitting: Registered Nurse

## 2021-06-29 DIAGNOSIS — R7303 Prediabetes: Secondary | ICD-10-CM

## 2021-06-29 DIAGNOSIS — F3289 Other specified depressive episodes: Secondary | ICD-10-CM

## 2021-07-21 ENCOUNTER — Ambulatory Visit: Payer: BC Managed Care – PPO | Admitting: Registered Nurse

## 2021-08-16 ENCOUNTER — Other Ambulatory Visit: Payer: Self-pay | Admitting: Registered Nurse

## 2021-08-16 DIAGNOSIS — G43709 Chronic migraine without aura, not intractable, without status migrainosus: Secondary | ICD-10-CM

## 2021-08-16 DIAGNOSIS — E038 Other specified hypothyroidism: Secondary | ICD-10-CM

## 2021-08-16 DIAGNOSIS — F419 Anxiety disorder, unspecified: Secondary | ICD-10-CM

## 2021-09-15 ENCOUNTER — Telehealth: Payer: Self-pay | Admitting: Internal Medicine

## 2021-09-15 MED ORDER — REPATHA SURECLICK 140 MG/ML ~~LOC~~ SOAJ: 1.0000 | SUBCUTANEOUS | 0 refills | Status: DC

## 2021-09-15 NOTE — Telephone Encounter (Signed)
Left message for patient regarding change from [place] to [place] given insurance company's change in preferred [id number]. Rx sent to [facility]. Advised in VM to obtain a co-pay card.   Med is authorized until 12/03/2021  Per review of chart, patient has not been seen since 02/2019 for telehealth visit. Message sent to [facility] regarding need for appt

## 2021-09-16 NOTE — Telephone Encounter (Signed)
LVM for patient to schedule

## 2021-09-23 NOTE — Telephone Encounter (Signed)
LVM for patient to schedule

## 2021-10-03 ENCOUNTER — Other Ambulatory Visit: Payer: Self-pay | Admitting: Registered Nurse

## 2021-10-03 DIAGNOSIS — F418 Other specified anxiety disorders: Secondary | ICD-10-CM

## 2021-10-03 DIAGNOSIS — R7303 Prediabetes: Secondary | ICD-10-CM

## 2021-10-04 NOTE — Telephone Encounter (Signed)
LVM for patient to schedule

## 2021-10-06 ENCOUNTER — Encounter: Payer: Self-pay | Admitting: Internal Medicine

## 2021-10-19 ENCOUNTER — Encounter (INDEPENDENT_AMBULATORY_CARE_PROVIDER_SITE_OTHER): Payer: Self-pay

## 2021-12-21 ENCOUNTER — Encounter (HOSPITAL_COMMUNITY): Payer: Self-pay | Admitting: *Deleted

## 2021-12-21 NOTE — Telephone Encounter (Signed)
Note not needed 

## 2021-12-28 ENCOUNTER — Telehealth: Payer: Self-pay | Admitting: Registered Nurse

## 2021-12-28 ENCOUNTER — Other Ambulatory Visit: Payer: Self-pay

## 2021-12-28 DIAGNOSIS — R7303 Prediabetes: Secondary | ICD-10-CM

## 2021-12-28 DIAGNOSIS — F3289 Other specified depressive episodes: Secondary | ICD-10-CM

## 2021-12-28 MED ORDER — BUPROPION HCL ER (SR) 150 MG PO TB12: 150.0000 mg | ORAL_TABLET | Freq: Two times a day (BID) | ORAL | 1 refills | Status: DC

## 2021-12-28 MED ORDER — METFORMIN HCL 500 MG PO TABS: 500.0000 mg | ORAL_TABLET | Freq: Two times a day (BID) | ORAL | 1 refills | Status: DC

## 2021-12-28 NOTE — Telephone Encounter (Signed)
Encourage patient to contact the pharmacy for refills or they can request refills through [facility]  (Please schedule appointment if patient has not been seen in over a year)  Last visit was 03/24/21  WHAT PHARMACY WOULD THEY LIKE THIS SENT TO: [facility] [phone] - [place], [place] NAME & DOSE: Metformin 500 mg and bupropion SR 150 mg  NOTES/COMMENTS FROM PATIENT: Pt has appt with Dr.Tabori tomorrow at 2.      [place] office please notify patient: It takes 48-72 hours to process rx refill requests Ask patient to call pharmacy to ensure rx is ready before heading there.

## 2021-12-28 NOTE — Telephone Encounter (Signed)
Sent refills patient will need to establish with a new PCP to continue refills, LM informing pt of refills

## 2021-12-29 ENCOUNTER — Ambulatory Visit: Payer: BC Managed Care – PPO | Admitting: Family Medicine

## 2021-12-29 ENCOUNTER — Encounter: Payer: Self-pay | Admitting: Family Medicine

## 2021-12-29 VITALS — BP 136/88 | HR 92 | Temp 98.1°F | Resp 17 | Ht 68.0 in | Wt 262.1 lb

## 2021-12-29 DIAGNOSIS — R7303 Prediabetes: Secondary | ICD-10-CM

## 2021-12-29 DIAGNOSIS — E669 Obesity, unspecified: Secondary | ICD-10-CM | POA: Diagnosis not present

## 2021-12-29 DIAGNOSIS — F418 Other specified anxiety disorders: Secondary | ICD-10-CM

## 2021-12-29 DIAGNOSIS — E039 Hypothyroidism, unspecified: Secondary | ICD-10-CM | POA: Diagnosis not present

## 2021-12-29 DIAGNOSIS — F3289 Other specified depressive episodes: Secondary | ICD-10-CM | POA: Diagnosis not present

## 2021-12-29 DIAGNOSIS — E7849 Other hyperlipidemia: Secondary | ICD-10-CM

## 2021-12-29 LAB — CBC WITH DIFFERENTIAL/PLATELET
Basophils Absolute: 0 10*3/uL (ref 0.0–0.1)
Basophils Relative: 0.4 % (ref 0.0–3.0)
Eosinophils Absolute: 0.3 10*3/uL (ref 0.0–0.7)
Eosinophils Relative: 3.8 % (ref 0.0–5.0)
HCT: 42.5 % (ref 36.0–46.0)
Hemoglobin: 14.5 g/dL (ref 12.0–15.0)
Lymphocytes Relative: 32 % (ref 12.0–46.0)
Lymphs Abs: 2.2 10*3/uL (ref 0.7–4.0)
MCHC: 34 g/dL (ref 30.0–36.0)
MCV: 90.4 fl (ref 78.0–100.0)
Monocytes Absolute: 0.4 10*3/uL (ref 0.1–1.0)
Monocytes Relative: 5 % (ref 3.0–12.0)
Neutro Abs: 4.1 10*3/uL (ref 1.4–7.7)
Neutrophils Relative %: 58.8 % (ref 43.0–77.0)
Platelets: 234 10*3/uL (ref 150.0–400.0)
RBC: 4.7 Mil/uL (ref 3.87–5.11)
RDW: 12.5 % (ref 11.5–15.5)
WBC: 7 10*3/uL (ref 4.0–10.5)

## 2021-12-29 LAB — HEMOGLOBIN A1C: Hgb A1c MFr Bld: 5.1 % (ref 4.6–6.5)

## 2021-12-29 MED ORDER — BUPROPION HCL ER (SR) 150 MG PO TB12: 150.0000 mg | ORAL_TABLET | Freq: Two times a day (BID) | ORAL | 1 refills | Status: DC

## 2021-12-29 MED ORDER — METFORMIN HCL 500 MG PO TABS: 500.0000 mg | ORAL_TABLET | Freq: Two times a day (BID) | ORAL | 3 refills | Status: DC

## 2021-12-29 MED ORDER — CITALOPRAM HYDROBROMIDE 20 MG PO TABS: 20.0000 mg | ORAL_TABLET | Freq: Every day | ORAL | 1 refills | Status: DC

## 2021-12-29 MED ORDER — FLUTICASONE PROPIONATE 50 MCG/ACT NA SUSP: 2.0000 | Freq: Every day | NASAL | 6 refills | Status: DC

## 2021-12-29 NOTE — Assessment & Plan Note (Signed)
Chronic problem.  Pt has not paid much attention to whether she is symptomatic or not as she states there has been 'a lot going on'.  Check labs.  Adjust meds prn

## 2021-12-29 NOTE — Assessment & Plan Note (Signed)
Deteriorated.  Pt has had a lot of family stress recently and admits to being overwhelmed.  Will increase the Citalopram to 75m daily and monitor for improvement.  Wellbutrin and Buspar refilled today.

## 2021-12-29 NOTE — Progress Notes (Signed)
Subjective:    Patient ID: Melanie [name], female    DOB: April 13, 1975, 46 y.o.   MRN: [id number]  HPI Hyperlipidemia- chronic problem, on Repatha 140mg  q14.  No CP, SOB, abd pain, N/V.  Pt admits to not remembering her injections and does not take them regularly  Obesity- current BMI 39.86  Pt has gained 20 lbs since January.  Pt is s/p lab band surgery.  Hypothyroid- chronic problem, on Levothyroxine daily.    Depression/anxiety- ongoing issue for pt, Currently on Wellbutrin SR 150mg  BID, Buspar 15mg  BID, Celexa 40mg  daily w/ adequate control.  Pt has had a lot of family stressors recently.   Review of Systems For ROS see HPI     Objective:   Physical Exam Vitals reviewed.  Constitutional:      General: She is not in acute distress.    Appearance: Normal appearance. She is well-developed. She is obese. She is not ill-appearing.  HENT:     Head: Normocephalic and atraumatic.  Eyes:     Conjunctiva/sclera: Conjunctivae normal.     Pupils: Pupils are equal, round, and reactive to light.  Neck:     Thyroid: No thyromegaly.  Cardiovascular:     Rate and Rhythm: Normal rate and regular rhythm.     Heart sounds: Normal heart sounds. No murmur heard. Pulmonary:     Effort: Pulmonary effort is normal. No respiratory distress.     Breath sounds: Normal breath sounds.  Abdominal:     General: There is no distension.     Palpations: Abdomen is soft.     Tenderness: There is no abdominal tenderness.  Musculoskeletal:     Cervical back: Normal range of motion and neck supple.  Lymphadenopathy:     Cervical: No cervical adenopathy.  Skin:    General: Skin is warm and dry.  Neurological:     General: No focal deficit present.     Mental Status: She is alert and oriented to person, place, and time.  Psychiatric:        Mood and Affect: Mood normal.        Behavior: Behavior normal.        Thought Content: Thought content normal.           Assessment & Plan:

## 2021-12-29 NOTE — Assessment & Plan Note (Signed)
Pt has gained 20 lbs since January.  Encouraged low carb diet and regular physical activity.  Check labs to risk stratify.  Will follow.

## 2021-12-29 NOTE — Patient Instructions (Signed)
Follow up in 4-6 weeks to recheck mood We'll notify you of your lab results and make any changes if needed Continue to work on healthy diet and regular exercise- you can do it!!! INCREASE the Citalopram to 80m daily- 1 248m+ 1 4036m 76m27mll with any questions or concerns Stay Safe!  Stay Healthy! Hang in there!!!

## 2021-12-29 NOTE — Assessment & Plan Note (Signed)
Chronic problem.  On Repatha q14 days but pt admits that she doesn't take this regularly.  Check labs.  Adjust meds prn

## 2021-12-29 NOTE — Assessment & Plan Note (Signed)
Metformin refilled.  Check labs and adjust meds prn.

## 2021-12-30 LAB — LIPID PANEL
Cholesterol: 436 mg/dL — ABNORMAL HIGH (ref 0–200)
HDL: 69 mg/dL (ref >39.00)
NonHDL: 367.17
Total CHOL/HDL Ratio: 6
Triglycerides: 283 mg/dL — ABNORMAL HIGH (ref 0.0–149.0)
VLDL: 56.6 mg/dL — ABNORMAL HIGH (ref 0.0–40.0)

## 2021-12-30 LAB — HEPATIC FUNCTION PANEL
ALT: 29 U/L (ref 0–35)
AST: 25 U/L (ref 0–37)
Albumin: 3.6 g/dL (ref 3.5–5.2)
Alkaline Phosphatase: 105 U/L (ref 39–117)
Bilirubin, Direct: 0.1 mg/dL (ref 0.0–0.3)
Total Bilirubin: 0.6 mg/dL (ref 0.2–1.2)
Total Protein: 6.1 g/dL (ref 6.0–8.3)

## 2021-12-30 LAB — BASIC METABOLIC PANEL
BUN: 15 mg/dL (ref 6–23)
CO2: 24 mEq/L (ref 19–32)
Calcium: 10.8 mg/dL — ABNORMAL HIGH (ref 8.4–10.5)
Chloride: 107 mEq/L (ref 96–112)
Creatinine, Ser: 1.31 mg/dL — ABNORMAL HIGH (ref 0.40–1.20)
GFR: 48.42 mL/min — ABNORMAL LOW (ref >60.00)
Glucose, Bld: 108 mg/dL — ABNORMAL HIGH (ref 70–99)
Potassium: 4.1 mEq/L (ref 3.5–5.1)
Sodium: 141 mEq/L (ref 135–145)

## 2021-12-30 LAB — LDL CHOLESTEROL, DIRECT: Direct LDL: 307 mg/dL

## 2021-12-30 LAB — TSH: TSH: 5.16 u[IU]/mL (ref 0.35–5.50)

## 2022-01-02 ENCOUNTER — Other Ambulatory Visit: Payer: Self-pay

## 2022-01-02 DIAGNOSIS — R7989 Other specified abnormal findings of blood chemistry: Secondary | ICD-10-CM

## 2022-01-02 NOTE — Progress Notes (Signed)
Informed pt of lab results. Lab only visit made and BMP order is in

## 2022-01-09 ENCOUNTER — Other Ambulatory Visit (INDEPENDENT_AMBULATORY_CARE_PROVIDER_SITE_OTHER): Payer: BC Managed Care – PPO

## 2022-01-09 DIAGNOSIS — R7989 Other specified abnormal findings of blood chemistry: Secondary | ICD-10-CM | POA: Diagnosis not present

## 2022-01-09 LAB — BASIC METABOLIC PANEL
BUN: 17 mg/dL (ref 6–23)
CO2: 29 mEq/L (ref 19–32)
Calcium: 10 mg/dL (ref 8.4–10.5)
Chloride: 106 mEq/L (ref 96–112)
Creatinine, Ser: 1.21 mg/dL — ABNORMAL HIGH (ref 0.40–1.20)
GFR: 53.25 mL/min — ABNORMAL LOW (ref >60.00)
Glucose, Bld: 101 mg/dL — ABNORMAL HIGH (ref 70–99)
Potassium: 4.3 mEq/L (ref 3.5–5.1)
Sodium: 139 mEq/L (ref 135–145)

## 2022-01-11 ENCOUNTER — Telehealth: Payer: Self-pay | Admitting: Pharmacist Clinician (PhC)/ Clinical Pharmacy Specialist

## 2022-01-11 MED ORDER — REPATHA SURECLICK 140 MG/ML ~~LOC~~ SOAJ: SUBCUTANEOUS | 0 refills | Status: AC

## 2022-01-11 NOTE — Telephone Encounter (Signed)
PA approved to 01-12-2023

## 2022-01-11 NOTE — Telephone Encounter (Signed)
PA sent to [facility]

## 2022-01-27 ENCOUNTER — Encounter: Payer: Self-pay | Admitting: Family Medicine

## 2022-01-27 ENCOUNTER — Ambulatory Visit (INDEPENDENT_AMBULATORY_CARE_PROVIDER_SITE_OTHER): Payer: BC Managed Care – PPO | Admitting: Family Medicine

## 2022-01-27 VITALS — BP 130/84 | HR 69 | Temp 98.2°F | Resp 17 | Ht 68.0 in | Wt 267.4 lb

## 2022-01-27 DIAGNOSIS — F418 Other specified anxiety disorders: Secondary | ICD-10-CM

## 2022-01-27 DIAGNOSIS — M545 Low back pain, unspecified: Secondary | ICD-10-CM | POA: Diagnosis not present

## 2022-01-27 LAB — POCT URINALYSIS DIPSTICK
Blood, UA: POSITIVE
Glucose, UA: NEGATIVE
Protein, UA: POSITIVE — AB
Spec Grav, UA: 1.02 (ref 1.010–1.025)
Urobilinogen, UA: 0.2 E.U./dL
pH, UA: 6.5 (ref 5.0–8.0)

## 2022-01-27 MED ORDER — CEPHALEXIN 500 MG PO CAPS: 500.0000 mg | ORAL_CAPSULE | Freq: Two times a day (BID) | ORAL | 0 refills | Status: AC

## 2022-01-27 NOTE — Patient Instructions (Addendum)
Schedule an appt w/ Di Kindle (the new nurse practitioner) to establish care in February CONTINUE the Citalopram 69m daily- you're doing great!! START the Cephalexin twice daily- take w/ food- for presumed UTI Drink LOTS of fluids to flush those kidneys Call with any questions or concerns Stay Safe!  Stay Healthy! Happy Holidays!!!

## 2022-01-27 NOTE — Progress Notes (Signed)
Subjective:    Patient ID: Melanie [name], female    DOB: 31-Jul-1975, 46 y.o.   MRN: [id number]  HPI Depression w/ anxiety- at last visit Citalopram was increased to 60mg  daily.  Pt feels increased dose is beneficial.  Also on Wellbutrin SR 150mg  BID.  Pt reports feeling 'more myself'.  Things don't feel as heavy.  'it's just easier to do things'.    'i think it's a kidney infection'- pt is having pain in her back.  Has had UTI previously and this feels similar.  No burning w/ urination.  + increased frequency.  + urgency.  Some suprapubic pressure.  + odor to urine.   Review of Systems For ROS see HPI     Objective:   Physical Exam Vitals reviewed.  Constitutional:      General: She is not in acute distress.    Appearance: Normal appearance. She is not ill-appearing.  HENT:     Head: Normocephalic and atraumatic.  Abdominal:     General: There is no distension.     Palpations: Abdomen is soft.     Tenderness: There is abdominal tenderness (mild suprapubic TTP). There is no right CVA tenderness or left CVA tenderness.  Skin:    General: Skin is warm and dry.  Neurological:     General: No focal deficit present.     Mental Status: She is alert and oriented to person, place, and time.  Psychiatric:        Mood and Affect: Mood normal.        Behavior: Behavior normal.        Thought Content: Thought content normal.           Assessment & Plan:  LBP- new.  Pt reports this feels similar to previous UTIs.  UA w/ moderate leuks.  + suprapubic TTP, urine odor, urgency and frequency.  Start Keflex for presumed infxn and wait on culture results.  Pt expressed understanding and is in agreement w/ plan.

## 2022-01-31 LAB — URINE CULTURE
MICRO NUMBER:: 14206997
SPECIMEN QUALITY:: ADEQUATE

## 2022-02-05 NOTE — Assessment & Plan Note (Signed)
Improved w/ increased dose of Citalopram.  At 6m daily she feels more like herself and that it's easier to do things.  Will continue Citalopram 632mdaily and Wellbutrin SR 15055mID.  Pt expressed understanding and is in agreement w/ plan.

## 2022-02-16 ENCOUNTER — Other Ambulatory Visit: Payer: Self-pay

## 2022-02-16 ENCOUNTER — Telehealth: Payer: Self-pay | Admitting: Registered Nurse

## 2022-02-16 DIAGNOSIS — G43709 Chronic migraine without aura, not intractable, without status migrainosus: Secondary | ICD-10-CM

## 2022-02-16 DIAGNOSIS — F419 Anxiety disorder, unspecified: Secondary | ICD-10-CM

## 2022-02-16 DIAGNOSIS — E038 Other specified hypothyroidism: Secondary | ICD-10-CM

## 2022-02-16 MED ORDER — LEVOTHYROXINE SODIUM 88 MCG PO TABS: ORAL_TABLET | ORAL | 1 refills | Status: DC

## 2022-02-16 MED ORDER — CITALOPRAM HYDROBROMIDE 40 MG PO TABS: 40.0000 mg | ORAL_TABLET | Freq: Every day | ORAL | 1 refills | Status: DC

## 2022-02-16 MED ORDER — TOPIRAMATE 100 MG PO TABS: 100.0000 mg | ORAL_TABLET | Freq: Every day | ORAL | 1 refills | Status: DC

## 2022-02-16 NOTE — Telephone Encounter (Signed)
Sent!

## 2022-02-16 NOTE — Telephone Encounter (Signed)
Pt informed

## 2022-02-16 NOTE — Telephone Encounter (Signed)
Encourage patient to contact the pharmacy for refills or they can request refills through [facility]  (Please schedule appointment if patient has not been seen in over a year) Last ov was 01/27/22 with Dr.Tabori   WHAT [facility] THIS SENT TO: [facility] [id number] - [place], [place] NAME & DOSE: levothyroxine 88 mcg, Topiramate  100 mg and citalopram 40 mg   NOTES/COMMENTS FROM PATIENT: Pt will be establishing with [facility] in January.        [place] office please notify patient: It takes 48-72 hours to process rx refill requests Ask patient to call pharmacy to ensure rx is ready before heading there.

## 2022-04-30 ENCOUNTER — Encounter (HOSPITAL_COMMUNITY): Payer: Self-pay | Admitting: Emergency Medicine

## 2022-04-30 ENCOUNTER — Emergency Department (HOSPITAL_COMMUNITY)
Admission: EM | Admit: 2022-04-30 | Discharge: 2022-04-30 | Disposition: A | Discharge status: Home / Self Care | Payer: BC Managed Care – PPO | Attending: Emergency Medicine | Admitting: Emergency Medicine

## 2022-04-30 ENCOUNTER — Other Ambulatory Visit: Payer: Self-pay

## 2022-04-30 ENCOUNTER — Emergency Department (HOSPITAL_COMMUNITY): Payer: BC Managed Care – PPO

## 2022-04-30 DIAGNOSIS — R202 Paresthesia of skin: Secondary | ICD-10-CM

## 2022-04-30 DIAGNOSIS — R7989 Other specified abnormal findings of blood chemistry: Secondary | ICD-10-CM | POA: Insufficient documentation

## 2022-04-30 DIAGNOSIS — R519 Headache, unspecified: Secondary | ICD-10-CM

## 2022-04-30 DIAGNOSIS — Z79899 Other long term (current) drug therapy: Secondary | ICD-10-CM | POA: Diagnosis not present

## 2022-04-30 DIAGNOSIS — R29810 Facial weakness: Secondary | ICD-10-CM | POA: Insufficient documentation

## 2022-04-30 LAB — CBC
HCT: 41.4 % (ref 36.0–46.0)
Hemoglobin: 14 g/dL (ref 12.0–15.0)
MCH: 30.9 pg (ref 26.0–34.0)
MCHC: 33.8 g/dL (ref 30.0–36.0)
MCV: 91.4 fL (ref 80.0–100.0)
Platelets: 193 10*3/uL (ref 150–400)
RBC: 4.53 MIL/uL (ref 3.87–5.11)
RDW: 12.2 % (ref 11.5–15.5)
WBC: 5.4 10*3/uL (ref 4.0–10.5)
nRBC: 0 % (ref 0.0–0.2)

## 2022-04-30 LAB — DIFFERENTIAL
Abs Immature Granulocytes: 0.03 10*3/uL (ref 0.00–0.07)
Basophils Absolute: 0 10*3/uL (ref 0.0–0.1)
Basophils Relative: 1 %
Eosinophils Absolute: 0.2 10*3/uL (ref 0.0–0.5)
Eosinophils Relative: 4 %
Immature Granulocytes: 1 %
Lymphocytes Relative: 39 %
Lymphs Abs: 2.1 10*3/uL (ref 0.7–4.0)
Monocytes Absolute: 0.3 10*3/uL (ref 0.1–1.0)
Monocytes Relative: 6 %
Neutro Abs: 2.7 10*3/uL (ref 1.7–7.7)
Neutrophils Relative %: 49 %

## 2022-04-30 LAB — COMPREHENSIVE METABOLIC PANEL
ALT: 31 U/L (ref 0–44)
AST: 30 U/L (ref 15–41)
Albumin: 3.3 g/dL — ABNORMAL LOW (ref 3.5–5.0)
Alkaline Phosphatase: 86 U/L (ref 38–126)
Anion gap: 7 (ref 5–15)
BUN: 16 mg/dL (ref 6–20)
CO2: 25 mmol/L (ref 22–32)
Calcium: 9.6 mg/dL (ref 8.9–10.3)
Chloride: 108 mmol/L (ref 98–111)
Creatinine, Ser: 1.24 mg/dL — ABNORMAL HIGH (ref 0.44–1.00)
GFR, Estimated: 54 mL/min — ABNORMAL LOW (ref >60)
Glucose, Bld: 95 mg/dL (ref 70–99)
Potassium: 3.7 mmol/L (ref 3.5–5.1)
Sodium: 140 mmol/L (ref 135–145)
Total Bilirubin: 0.9 mg/dL (ref 0.3–1.2)
Total Protein: 5.8 g/dL — ABNORMAL LOW (ref 6.5–8.1)

## 2022-04-30 LAB — CBG MONITORING, ED: Glucose-Capillary: 98 mg/dL (ref 70–99)

## 2022-04-30 LAB — PROTIME-INR
INR: 0.9 (ref 0.8–1.2)
Prothrombin Time: 12.4 seconds (ref 11.4–15.2)

## 2022-04-30 LAB — ETHANOL: Alcohol, Ethyl (B): <10 mg/dL (ref <10)

## 2022-04-30 LAB — HCG, QUANTITATIVE, PREGNANCY: hCG, Beta Chain, Quant, S: 4 m[IU]/mL (ref <5)

## 2022-04-30 LAB — APTT: aPTT: 26 seconds (ref 24–36)

## 2022-04-30 MED ORDER — DIPHENHYDRAMINE HCL 50 MG/ML IJ SOLN
25.0000 mg | Freq: Once | INTRAMUSCULAR | Status: AC
  Administered 2022-04-30: 25 mg via INTRAVENOUS
  Filled 2022-04-30: qty 1

## 2022-04-30 MED ORDER — IOHEXOL 350 MG/ML SOLN
75.0000 mL | Freq: Once | INTRAVENOUS | Status: AC | PRN
  Administered 2022-04-30: 75 mL via INTRAVENOUS

## 2022-04-30 MED ORDER — METOCLOPRAMIDE HCL 5 MG/ML IJ SOLN
10.0000 mg | Freq: Once | INTRAMUSCULAR | Status: AC
  Administered 2022-04-30: 10 mg via INTRAVENOUS
  Filled 2022-04-30: qty 2

## 2022-04-30 NOTE — ED Triage Notes (Signed)
Patient arrives in wheelchair with husband c/o right sided facial numbness, right sided facial droop and headache. Patient states she went to bed around 11pm with a small headache then woke up multiple times throughout the night with headache. Patient states at 9:42 she texted her husband saying that her right side of her face felt different. Patient A&0x4 on arrival.

## 2022-04-30 NOTE — Discharge Instructions (Addendum)
Please read and follow all provided instructions.  Your diagnoses today include:  1. Acute nonintractable headache, unspecified headache type   2. Facial paresthesia     Tests performed today include: CT of your head and neck which was normal and did not show any serious cause of your headache, such as bleeding or an aneurysm Complete blood cell count: Normal blood cell counts Complete metabolic panel: Slightly elevated creatinine (kidney function) Urine test: No signs of infection Vital signs. See below for your results today.   Medications:  In the Emergency Department you received: Reglan - antinausea/headache medication Benadryl - antihistamine to counteract potential side effects of reglan  Take any prescribed medications only as directed.  Additional information:  Follow any educational materials contained in this packet.  You are having a headache. No specific cause was found today for your headache. It may have been a migraine or other cause of headache. Stress, anxiety, fatigue, and depression are common triggers for headaches.   Your headache today does not appear to be life-threatening or require hospitalization, but often the exact cause of headaches is not determined in the emergency department. Therefore, follow-up with your doctor is very important to find out what may have caused your headache and whether or not you need any further diagnostic testing or treatment.   Sometimes headaches can appear benign (not harmful), but then more serious symptoms can develop which should prompt an immediate re-evaluation by your doctor or the emergency department.  BE VERY CAREFUL not to take multiple medicines containing Tylenol (also called acetaminophen). Doing so can lead to an overdose which can damage your liver and cause liver failure and possibly death.   Follow-up instructions: Please follow-up with your primary care provider in the next 3 days for further evaluation of  your symptoms.   Return instructions:  Please return to the Emergency Department if you experience worsening symptoms. Return if the medications do not resolve your headache, if it recurs, or if you have multiple episodes of vomiting or cannot keep down fluids. Return if you have a change from the usual headache. RETURN IMMEDIATELY IF you: Develop a sudden, severe headache Develop confusion or become poorly responsive or faint Develop a fever above 100.32F or problem breathing Have a change in speech, vision, swallowing, or understanding Develop new weakness, numbness, tingling, incoordination in your arms or legs Have a seizure Please return if you have any other emergent concerns.  Additional Information:  Your vital signs today were: BP (!) 145/79   Pulse (!) 57   Temp 98 F (36.7 C)   Resp 14   Ht 5' 8"$  (1.727 m)   Wt 121.1 kg   SpO2 100%   BMI 40.60 kg/m  If your blood pressure (BP) was elevated above 135/85 this visit, please have this repeated by your doctor within one month. --------------

## 2022-04-30 NOTE — ED Notes (Signed)
MD made aware of pt arrival to rm 15. Last known well 11pm 04/29/22 per pt.

## 2022-04-30 NOTE — ED Notes (Signed)
Pt initial NIH assessment 0, and passed swallow screen.

## 2022-04-30 NOTE — ED Notes (Signed)
Back from CT via stretcher, alert, amiable, smiling, NAD, calm, interactive.

## 2022-04-30 NOTE — ED Notes (Signed)
Pt able to ambulate and given cup of water.

## 2022-04-30 NOTE — ED Provider Notes (Signed)
[facility] AT [facility] Provider Note   CSN: [id number] Arrival date & time: 04/30/22  1009     History  Chief Complaint  Patient presents with   Facial Droop   Headache    Melanie [name] is a 47 y.o. female.  Patient with history of hyperlipidemia, migraine headaches --presents to the emergency department for evaluation of right-sided facial paresthesia and headache.  Patient went to bed around 11 PM last night.  At that time she had a mild headache.  She states that this is not unusual for her.  However she did wake up several times overnight with more severe headache.  She did not have associated nausea or vomiting at that time.  She got up this morning and went to church despite her headache.  Around 9:40 AM, she developed a unusual sensation in the right side of her face.  She reports blurry vision but no loss of vision as well.  She was able to text her husband and then they decided to come to the hospital.  Current headache is different than her usual migraine headaches.  She denies weakness in the arms or legs.  No fevers, nausea or vomiting.  She has had some bilateral neck pain which she describes as "tension".       Home Medications Prior to Admission medications   Medication Sig Start Date End Date Taking? Authorizing Provider  buPROPion (WELLBUTRIN SR) 150 MG 12 hr tablet Take 1 tablet (150 mg total) by mouth 2 (two) times daily. 12/28/21   Midge Minium, MD  buPROPion (WELLBUTRIN SR) 150 MG 12 hr tablet Take 1 tablet (150 mg total) by mouth 2 (two) times daily. 12/29/21   Midge Minium, MD  busPIRone (BUSPAR) 15 MG tablet TAKE ONE TABLET BY MOUTH TWICE A DAY 06/30/21   Maximiano Coss, NP  cetirizine (ZYRTEC) 10 MG tablet Take 10 mg by mouth at bedtime.     [provider]  cholecalciferol (VITAMIN D) 25 MCG (1000 UNIT) tablet Take 4,000 Units by mouth daily.    [provider]  citalopram (CELEXA) 20 MG  tablet Take 1 tablet (20 mg total) by mouth daily. In addition to the 22m dose for a total of 652m10/19/23   TaMidge MiniumMD  citalopram (CELEXA) 40 MG tablet Take 1 tablet (40 mg total) by mouth daily. 02/16/22   TaMidge MiniumMD  Evolocumab (REPATHA SURECLICK) 14XX123456G/ML SOAJ Inject 1 pen every 14 days, must schedule OV for further refills. 01/11/22   Hilty, KeNadean CorwinMD  fluticasone (FLONASE) 50 MCG/ACT nasal spray Place 2 sprays into both nostrils daily. 05/21/20   MoMaximiano CossNP  fluticasone (FLONASE) 50 MCG/ACT nasal spray Place 2 sprays into both nostrils daily. 12/29/21   TaMidge MiniumMD  levothyroxine (SYNTHROID) 88 MCG tablet TAKE ONE TABLET BY MOUTH DAILY BEFORE BREAKFAST 02/16/22   TaMidge MiniumMD  LORazepam (ATIVAN) 1 MG tablet TAKE ONE TABLET BY MOUTH TWICE A DAY AS NEEDED FOR ANXIETY 10/03/21   MoMaximiano CossNP  montelukast (SINGULAIR) 10 MG tablet TAKE ONE TABLET BY MOUTH EVERY NIGHT AT BEDTIME 06/27/21   MoMaximiano CossNP  Multiple Vitamin (MULTIVITAMIN WITH MINERALS) TABS tablet Take 1 tablet by mouth daily.    [provider]  Omega-3 Fatty Acids (FISH OIL) 1200 MG CAPS Take 1,200 mg by mouth in the morning and at bedtime.    [provider]  Polyethyl Glycol-Propyl  Glycol (LUBRICANT EYE DROPS) 0.4-0.3 % SOLN Place 1 drop into both eyes 3 (three) times daily as needed (dry/irritated eyes).    [provider]  topiramate (TOPAMAX) 100 MG tablet Take 1 tablet (100 mg total) by mouth at bedtime. 02/16/22   Midge Minium, MD  Ubrogepant (UBRELVY) 50 MG TABS Take 50 mg by mouth every 2 (two) hours as needed (may repeat once after 2 hours). 05/21/20   Maximiano Coss, NP      Allergies    Welchol [colesevelam], Lidocaine, Pistachio nut (diagnostic), Relafen [nabumetone], Celebrex [celecoxib], and Sulfa antibiotics    Review of Systems   Review of Systems  Physical Exam Updated Vital Signs BP (!) 155/96   Pulse 88    Temp 98.2 F (36.8 C) (Oral)   Resp 18   Ht 5' 8"$  (1.727 m)   Wt 121.1 kg   SpO2 100%   BMI 40.60 kg/m   Physical Exam Vitals and nursing note reviewed.  Constitutional:      General: She is not in acute distress.    Appearance: She is well-developed.  HENT:     Head: Normocephalic and atraumatic.     Right Ear: External ear normal.     Left Ear: External ear normal.     Nose: Nose normal.     Mouth/Throat:     Pharynx: Uvula midline.  Eyes:     General: Lids are normal.     Extraocular Movements:     Right eye: No nystagmus.     Left eye: No nystagmus.     Conjunctiva/sclera: Conjunctivae normal.     Pupils: Pupils are equal, round, and reactive to light.  Cardiovascular:     Rate and Rhythm: Normal rate and regular rhythm.     Heart sounds: No murmur heard. Pulmonary:     Effort: Pulmonary effort is normal. No respiratory distress.     Breath sounds: Normal breath sounds. No wheezing, rhonchi or rales.  Abdominal:     Palpations: Abdomen is soft.     Tenderness: There is no abdominal tenderness. There is no guarding or rebound.  Musculoskeletal:     Cervical back: Normal range of motion and neck supple. No tenderness or bony tenderness.     Right lower leg: No edema.     Left lower leg: No edema.  Skin:    General: Skin is warm and dry.     Findings: No rash.  Neurological:     General: No focal deficit present.     Mental Status: She is alert and oriented to person, place, and time. Mental status is at baseline.     GCS: GCS eye subscore is 4. GCS verbal subscore is 5. GCS motor subscore is 6.     Cranial Nerves: No cranial nerve deficit.     Sensory: No sensory deficit.     Motor: No weakness.     Coordination: Coordination normal.     Gait: Gait normal.     Comments: Upper extremity myotomes tested bilaterally:  C5 Shoulder abduction 5/5 C6 Elbow flexion/wrist extension 5/5 C7 Elbow extension 5/5 C8 Finger flexion 5/5 T1 Finger abduction 5/5  Lower  extremity myotomes tested bilaterally: L2 Hip flexion 5/5 L3 Knee extension 5/5 L4 Ankle dorsiflexion 5/5 S1 Ankle plantar flexion 5/5   Psychiatric:        Mood and Affect: Mood normal.     ED Results / Procedures / Treatments   Labs (all labs ordered are listed,  but only abnormal results are displayed) Labs Reviewed  COMPREHENSIVE METABOLIC PANEL - Abnormal; Notable for the following components:      Result Value   Creatinine, Ser 1.24 (*)    Total Protein 5.8 (*)    Albumin 3.3 (*)    GFR, Estimated 54 (*)    All other components within normal limits  PROTIME-INR  APTT  CBC  DIFFERENTIAL  ETHANOL  HCG, QUANTITATIVE, PREGNANCY  CBG MONITORING, ED    EKG None  Radiology CT Angio Head W or Wo Contrast  Result Date: 04/30/2022 CLINICAL DATA:  Cerebral vaso spasm suspected. Migraine with facial numbness on the left EXAM: CT ANGIOGRAPHY HEAD AND NECK TECHNIQUE: Multidetector CT imaging of the head and neck was performed using the standard protocol during bolus administration of intravenous contrast. Multiplanar CT image reconstructions and MIPs were obtained to evaluate the vascular anatomy. Carotid stenosis measurements (when applicable) are obtained utilizing NASCET criteria, using the distal internal carotid diameter as the denominator. RADIATION DOSE REDUCTION: This exam was performed according to the departmental dose-optimization program which includes automated exposure control, adjustment of the mA and/or kV according to patient size and/or use of iterative reconstruction technique. CONTRAST:  58m OMNIPAQUE IOHEXOL 350 MG/ML SOLN COMPARISON:  None Available. FINDINGS: CT HEAD FINDINGS Brain: No evidence of acute infarction, hemorrhage, hydrocephalus, extra-axial collection or mass lesion/mass effect. Vascular: No hyperdense vessel or unexpected calcification. Skull: Normal. Negative for fracture or focal lesion. Sinuses/Orbits: No acute finding. Review of the MIP images  confirms the above findings CTA NECK FINDINGS Aortic arch: Normal with 3 vessel branching Right carotid system: Vessels widely patent without atheromatous changes. Equivocal beading best seen on sagittal reformats but mainly seen at levels of streak artifact. Left carotid system: Vessels are widely patent. No atheromatous changes. Vertebral arteries: No proximal subclavian stenosis. The vertebral arteries are smoothly contoured and widely patent. Skeleton: Mild cervical spine degeneration. No acute or aggressive finding. Other neck: Negative. Upper chest: Clear apical lungs Review of the MIP images confirms the above findings CTA HEAD FINDINGS Anterior circulation: No significant stenosis, proximal occlusion, aneurysm, or vascular malformation. Posterior circulation: No significant stenosis, proximal occlusion, aneurysm, or vascular malformation. Venous sinuses: Diffusely patent. Arachnoid granulation at the left transverse sigmoid junction. Anatomic variants: None significant Review of the MIP images confirms the above findings IMPRESSION: 1. No emergent finding or explanation for symptoms. 2. Subtle findings in the cervical ICA which could reflect minor fibromuscular dysplasia. Electronically Signed   By: JJorje GuildM.D.   On: 04/30/2022 11:56   CT Angio Neck W and/or Wo Contrast  Result Date: 04/30/2022 CLINICAL DATA:  Cerebral vaso spasm suspected. Migraine with facial numbness on the left EXAM: CT ANGIOGRAPHY HEAD AND NECK TECHNIQUE: Multidetector CT imaging of the head and neck was performed using the standard protocol during bolus administration of intravenous contrast. Multiplanar CT image reconstructions and MIPs were obtained to evaluate the vascular anatomy. Carotid stenosis measurements (when applicable) are obtained utilizing NASCET criteria, using the distal internal carotid diameter as the denominator. RADIATION DOSE REDUCTION: This exam was performed according to the departmental  dose-optimization program which includes automated exposure control, adjustment of the mA and/or kV according to patient size and/or use of iterative reconstruction technique. CONTRAST:  763mOMNIPAQUE IOHEXOL 350 MG/ML SOLN COMPARISON:  None Available. FINDINGS: CT HEAD FINDINGS Brain: No evidence of acute infarction, hemorrhage, hydrocephalus, extra-axial collection or mass lesion/mass effect. Vascular: No hyperdense vessel or unexpected calcification. Skull: Normal. Negative for fracture or focal  lesion. Sinuses/Orbits: No acute finding. Review of the MIP images confirms the above findings CTA NECK FINDINGS Aortic arch: Normal with 3 vessel branching Right carotid system: Vessels widely patent without atheromatous changes. Equivocal beading best seen on sagittal reformats but mainly seen at levels of streak artifact. Left carotid system: Vessels are widely patent. No atheromatous changes. Vertebral arteries: No proximal subclavian stenosis. The vertebral arteries are smoothly contoured and widely patent. Skeleton: Mild cervical spine degeneration. No acute or aggressive finding. Other neck: Negative. Upper chest: Clear apical lungs Review of the MIP images confirms the above findings CTA HEAD FINDINGS Anterior circulation: No significant stenosis, proximal occlusion, aneurysm, or vascular malformation. Posterior circulation: No significant stenosis, proximal occlusion, aneurysm, or vascular malformation. Venous sinuses: Diffusely patent. Arachnoid granulation at the left transverse sigmoid junction. Anatomic variants: None significant Review of the MIP images confirms the above findings IMPRESSION: 1. No emergent finding or explanation for symptoms. 2. Subtle findings in the cervical ICA which could reflect minor fibromuscular dysplasia. Electronically Signed   By: Jorje Guild M.D.   On: 04/30/2022 11:56    Procedures Procedures    Medications Ordered in ED Medications  metoCLOPramide (REGLAN)  injection 10 mg (10 mg Intravenous Given 04/30/22 1100)  diphenhydrAMINE (BENADRYL) injection 25 mg (25 mg Intravenous Given 04/30/22 1100)  iohexol (OMNIPAQUE) 350 MG/ML injection 75 mL (75 mLs Intravenous Contrast Given 04/30/22 1129)    ED Course/ Medical Decision Making/ A&P    Patient seen and examined. History obtained directly from patient and husband at bedside. Currently patient without any objective neuro deficits. No code stroke for that reason. Will plan imaging and treat HA.   Labs/EKG: Ordered stroke order set.  Imaging: Ordered CTA head and neck.  Medications/Fluids: Ordered: Reglan, Benadryl.   Most recent vital signs reviewed and are as follows: BP (!) 155/96   Pulse 88   Temp 98.2 F (36.8 C) (Oral)   Resp 18   Ht 5' 8"$  (1.727 m)   Wt 121.1 kg   SpO2 100%   BMI 40.60 kg/m   Initial impression: Headache, unlikely thunderclap.  12:56 PM Reassessment performed. Patient appears stable.  She states that subjectively she is feeling a lot better, just sleepy from the migraine cocktail.  Still has some pressure in the right frontal area.  Labs personally reviewed and interpreted including: CBC with differential unremarkable; CMP normal electrolytes, creatinine mildly elevated at 1.24; PT/INR and APTT ordered as part of stroke workup order set were normal; ethanol negative; pregnancy negative.  Imaging personally visualized and interpreted including: CT angio of the head and neck were negative for emergent findings.  Reviewed pertinent lab work and imaging with patient at bedside. Questions answered.  She seems reassured.  Most current vital signs reviewed and are as follows: BP (!) 149/82   Pulse (!) 57   Temp 98.2 F (36.8 C) (Oral)   Resp 15   Ht 5' 8"$  (1.727 m)   Wt 121.1 kg   SpO2 99%   BMI 40.60 kg/m   Plan: Will reassess in a little bit.  I wanted patient to ambulate and drink something to ensure she continues to do well.  She will likely need neurology  follow-up as outpatient.  States next appointment is in April.  2:14 PM Reassessment performed. Patient appears stable.  She is back to her baseline.  She feels a little dizzy with standing after receiving medications here, but no weakness.  I helped the patient ambulate to the restroom and  she did not require any assistance.  Labs personally reviewed and interpreted including:  Imaging personally visualized and interpreted including:  Reviewed pertinent lab work and imaging with patient at bedside. Questions answered.   Most current vital signs reviewed and are as follows: BP (!) 145/79   Pulse (!) 57   Temp 98 F (36.7 C)   Resp 14   Ht 5' 8"$  (1.727 m)   Wt 121.1 kg   SpO2 100%   BMI 40.60 kg/m   Plan: Discharge to home.   Prescriptions written for: None  Other home care instructions discussed: Rest, good hydration  ED return instructions discussed: Patient counseled to return if they have weakness in their arms or legs, slurred speech, trouble walking or talking, confusion, trouble with their balance, or if they have any other concerns. Patient verbalizes understanding and agrees with plan.   Follow-up instructions discussed: Patient encouraged to follow-up with their PCP in 3 days, neuro in 1 week.                              Medical Decision Making Amount and/or Complexity of Data Reviewed Labs: ordered. Radiology: ordered.  Risk Prescription drug management.   In regards to the patient's headache, critical differentials were considered including subarachnoid hemorrhage, intracerebral hemorrhage, epidural/subdural hematoma, pituitary apoplexy, vertebral/carotid artery dissection, giant cell arteritis, central venous thrombosis, reversible cerebral vasoconstriction, acute angle closure glaucoma, idiopathic intracranial hypertension, bacterial meningitis, viral encephalitis, carbon monoxide poisoning, posterior reversible encephalopathy syndrome, pre-eclampsia.   Reg  flag symptoms related to these causes were considered including systemic symptoms (fever, weight loss), neurologic symptoms (confusion, mental status change, vision change, associated seizure), acute or sudden "thunderclap" onset, patient age 46 or older with new or progressive headache, patient of any age with first headache or change in headache pattern, pregnant or postpartum status, history of HIV or other immunocompromise, history of cancer, headache occurring with exertion, associated neck or shoulder pain, associated traumatic injury, concurrent use of anticoagulation, family history of spontaneous SAH, and concurrent drug use.    Other benign, more common causes of headache were considered including migraine, tension-type headache, cluster headache, referred pain from other cause such as sinus infection, dental pain, trigeminal neuralgia.   On exam, patient has a reassuring neuro exam including baseline mental status, no significant neck pain or meningeal signs, no signs of severe infection or fever.  She is back to her baseline.  Suspect complex migraine based on history, severe headache, lack of other findings.  The patient's vital signs, pertinent lab work and imaging were reviewed and interpreted as discussed in the ED course. Hospitalization was considered for further testing, treatments, or serial exams/observation. However as patient is well-appearing, has a stable exam over the course of their evaluation, and reassuring studies today, I do not feel that they warrant admission at this time. This plan was discussed with the patient who verbalizes agreement and comfort with this plan and seems reliable and able to return to the Emergency Department with worsening or changing symptoms.          Final Clinical Impression(s) / ED Diagnoses Final diagnoses:  Acute nonintractable headache, unspecified headache type  Facial paresthesia    Rx / DC Orders ED Discharge Orders     None          Carlisle Cater, PA-C 04/30/22 1415    Fredia Sorrow, MD 05/03/22 1410

## 2022-06-28 ENCOUNTER — Encounter: Payer: Self-pay | Admitting: Neurology

## 2022-06-28 ENCOUNTER — Ambulatory Visit: Payer: BC Managed Care – PPO | Admitting: Neurology

## 2022-06-28 VITALS — BP 151/82 | HR 66 | Ht 68.0 in | Wt 290.0 lb

## 2022-06-28 DIAGNOSIS — G43019 Migraine without aura, intractable, without status migrainosus: Secondary | ICD-10-CM

## 2022-06-28 DIAGNOSIS — G4733 Obstructive sleep apnea (adult) (pediatric): Secondary | ICD-10-CM

## 2022-06-28 DIAGNOSIS — G43709 Chronic migraine without aura, not intractable, without status migrainosus: Secondary | ICD-10-CM

## 2022-06-28 DIAGNOSIS — R519 Headache, unspecified: Secondary | ICD-10-CM

## 2022-06-28 MED ORDER — TOPIRAMATE 100 MG PO TABS: ORAL_TABLET | ORAL | 3 refills | Status: DC

## 2022-06-28 MED ORDER — UBRELVY 100 MG PO TABS: 100.0000 mg | ORAL_TABLET | ORAL | 3 refills | Status: DC | PRN

## 2022-06-28 NOTE — Patient Instructions (Signed)
Please avoid drinking caffeine, limit yourself to 1 or 2 servings per day.  Try to hydrate well with water, 6 to 8 cups of water per day are recommended, 8 ounce size each per day on average.  Try to get 7 to 8 hours of sleep and get back on your BiPAP consistently.  If you would like to try it on a lower pressure, I would be happy to make a pressure reduction.  Please get in touch with your DME provider about your supplies.  Keep your appointment with [facility].  We will increase her Topamax to 50 mg in the morning and 100 mg at bedtime.  We will increase your as needed Ubrelvy to 100 mg strength.  Follow-up in 4 months to see Megan.

## 2022-06-28 NOTE — Progress Notes (Signed)
Subjective:    Patient ID: Melanie [name] is a 47 y.o. female.  HPI    Interim history:   Melanie [name] is a 47 year old right-handed woman with an underlying medical prediabetes, hyperlipidemia, reflux disease, depression, anxiety, allergies, anemia, lower extremity edema, OSA and morbid obesity with a BMI of over 45, who presents for follow-up consultation of her recurrent headaches and OSA.  The patient is unaccompanied today and presents after a long gap of nearly 3 years. I last saw her on 02/13/2019 for evaluation of her recurrent headaches.  She was started on Topamax for headache prevention and Ubrelvy as needed.  She was seen by Butch Penny, NP on 08/20/19, at which time she was compliant with her BiPAP of 18/14 cm.   Today, 06/28/22: She reports a migraine frequency of up to 2/week.  She has typically had right-sided headaches, retro-orbital pain on the right but in the past few months she feels that her headaches are more in the temples and more bilateral, also behind the left eye.  She has a regular eye examination with [facility].  She has an appointment pending this month.  She has had dilated eye exams and no problem with her eye nerve has been found.  She has not used her BiPAP and reports that she often sleeps in a recliner due to back and hip pain.  She is also missing a part of the headgear and has not ordered any new supplies lately.  She is willing to get back on her BiPAP.  She has had weight fluctuation, altogether has lost about 60 pounds since her weight loss surgery but has gained some weight back.  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  She has 5 children have a special needs.  She has a new cat which likes to sleep close to her head at night, sleep is interrupted, does not get 8 hours of sleep.  Tries to hydrate well, drinks about 3-4 bottles of water per day but also drinks quite a bit of caffeine in the form of diet soda, 3 to 4 cans/day.  She takes the  Topamax 100 mg at bedtime per PCP and Ubrelvy 50 mg as needed also per PCP.  She is establishing with a new provider in her PCP office.  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 She had a recent CT angiogram of the head and neck with and without contrast on 04/30/2022 and I reviewed the results: IMPRESSION: 1. No emergent finding or explanation for symptoms. 2. Subtle findings in the cervical ICA which could reflect minor fibromuscular dysplasia.  She presented to the emergency room on 04/30/2022 with a headache with right-sided facial paresthesia.  She was treated symptomatically with Benadryl, Reglan and IV fluids.   I reviewed her BiPAP compliance data from the past 90 days.  She used her BiPAP machine 24 out of 90 days between 03/29/2022 and 06/26/2022.  Percent use days greater than 4 hours at 27%, indicating low compliance, essentially no usage since 05/21/2022.  Average AHI in the past 3 months was 6.8/h, secondary to his central and obstructive events, pressure of 18/14, leak acceptable at 14.9 L/min.    The patient's allergies, current medications, family history, past medical history, past social history, past surgical history and problem list were reviewed and updated as appropriate.    Previously:     I saw her in a virtual visit on 08/15/2018, at which time she reported a 2  to 85-month history of nearly daily headaches.  She had been using an oral appliance for her obstructive sleep apnea.  She had a prior diagnosis of moderate obstructive sleep apnea.  Her primary care physician had ordered a brain MRI.  She was advised to try Ubrelvy.  I suggested we proceed with a sleep study to evaluate her sleep apnea while she is on treatment with her oral appliance.  She had a sleep study on 10/03/2018 with her oral appliance in place, and she had residual sleep disordered breathing with a total AHI of 13.5/h, REM AHI of 41.9/h, supine AHI of 13.5/h and O2 nadir of 78%.  She had moderate  PLM's without significant arousals.  She was advised to proceed with a formal titration study for sleep apnea treatment.  She had a CPAP titration study on 11/08/2018.  She had eventual resolution of her sleep disordered breathing with a BiPAP pressure of 18/14.  She was fitted with a full facemask.  Sleep efficiency was 84.4%, she had normal REM sleep during that study.  She was advised to start home BiPAP therapy.   She had a brain MRI with and without contrast on 09/25/2018 and I reviewed the results: IMPRESSION: Partially empty sella turcica. This is a common incidental finding, although can be associated with idiopathic intracranial hypertension.   Otherwise unremarkable brain MRI.     02/13/19: I reviewed her BiPAP compliance data from 01/13/2019 through 02/11/2019 which is a total of 30 days, during which time she used her BiPAP every night with percent use days greater than 4 hours at 97%, indicating excellent compliance with an average usage of 7 hours and 22 minutes, residual AHI borderline at 6.9/h, leak on the low side with a 95th percentile at 4.6 L/min on a pressure of 18/14.  Her residual events are mostly obstructive in nature, central apnea index was 2.2/h.  She reports feeling better in terms of her daytime somnolence and sleep quality.  Headache severity is also better.  Nevertheless, she still has some intermittent head pressure, sometimes feels she has blurry vision but had a full eye examination about 3 to 4 months ago and got new prescription eyeglasses.  She has no visual field disturbance, sometimes she feels lightheaded especially if she stands up after sitting for prolonged period of time.  She has had some spinning sensation such as vertigo, no hearing loss or tinnitus.  She tries to hydrate well.  She drinks caffeine in the form of Coke zero, 1 or 2 cans/day on average.  She goes to weight management.     She reports that for the past 2 or 3 months she has had recurrent nearly  daily headaches, they are often behind her eyes, right side more than left, sometimes viselike, sometimes stabbing or throbbing, sometimes in the top of her head.  She has some associated photophobia, rare nausea or vomiting, today she had some nausea with it.  She has no prior diagnosis of migraine headaches.  She was given a prescription for Imitrex by Dr. Creta Levin in in May 2020 when she had a virtual visit with her.  The patient reports that she has taken it almost daily.  She is strongly discouraged to take it every day.  She is advised that it is for as needed use only.  She states that she got a refill just yesterday.  She has a prior history of morning headaches.   I have seen her over 3 years ago for evaluation  of her obstructive sleep apnea concerns.  She had a split-night sleep study in March 2017 which indicated moderate to severe obstructive sleep apnea.  She decided to proceed with treatment with an oral appliance through her dentist.  She follows with medical weight management for her obesity. Dr. Creta Levin ordered a brain MRI, this is pending for later this month.  She has surgery on her right ulnar nerve scheduled for 08/21/2018.  She has a torn tendon.  She reports that she has been using her oral appliance, she has received the oral appliance through her dentist practice, [facility].  She has not had a follow-up sleep study.  She has had interim weight gain, at the time of her sleep study on 06/08/2015 her weight was documented at 296 pounds, she currently reports her weight at 325 pounds.  Her weight has been plateaued lately.  She is due for an eye examination this summer.  She feels that she needs a new prescription.  She has prescription eyeglasses.  She has occasional blurry vision.  She feels that the Imitrex 50 mg strength has been somewhat helpful.  She tries to hydrate well with water, estimates that she drinks about 3-4 bottles of water per day.  She has limited caffeine  intake, typically 1 diet Coke per day on average.  She has had intermittent lightheadedness, has a history of vertigo but most recently has felt more lightheaded than vertiginous.  She has had some intermittent tremors affecting both hands, left more than right, it comes and goes, mostly when she holds something.   06/01/2015: Melanie [name] is a 47 year old right-handed woman with an underlying medical history of allergies, anxiety, depression, abnormal LFTs, and morbid obesity, who reports snoring and excessive daytime somnolence, as well as witnessed apneic breathing pauses and morning headaches, difficulty initiating and maintaining sleep at times. She had a tonsillectomy and adenoidectomy as a child. She has a longer standing history of difficulty initiating and maintaining sleep. She feels it is hard for her to shut down at night. She sometimes watches TV in the family room until she feels sleepy enough to go to bed. Sometimes and that she reads or uses her cell phone. She does not typically watch TV in bed. Her husband sleeps with a CPAP but it does not disturb her. She has woken herself up with her snoring and a sense of gasping for air. She has had occasional morning headaches and nocturia is about once per night. She denies restless leg symptoms. Sometimes one of her cats jumped onto the bed and disturbs her sleep. She has tried over-the-counter sleep aids including p.m. type medications and melatonin and while these medications can work for a couple of days, they also keep her quite groggy during the day so she does not usually take any of these medications during the workweek. She has also been tried in the past on prescription sleeping pills including Ambien and Lunesta and perhaps Remeron and again she did not have any sustained results with any of the prescription medications and also suffered from daytime grogginess. She suspects that there is family history of sleep apnea, at least in her father but  both parents snored quite loudly she recalls that she was growing up. Bedtime is around 10 PM and he can take her long to fall asleep. Rise time is around 5:30, she does not typically wake up rested and often feels tired first thing in the morning. She does not drink caffeine on a  regular basis, occasional cola. She is a nonsmoker and does not drink alcohol. She lives at home with her husband and 3 foster children ages 2, 6 and 66, she is in the process of adopting her foster children. She works as a Runner, broadcasting/film/video at [employer] Her Epworth sleepiness score is 14 out of 24 today, her fatigue score is 45 out of 63.      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    Migraines    LIGHT SENSITIVITY , NOW ON IMITREX SINCE 08-02-2018,NOTICES IMPROVEMENT    Multiple food allergies    Muscle pain    Numbness and tingling in both hands    Pre-diabetes    Shortness of breath    was due to a medication side effect, now off medication , SOB resolved    Sleep apnea    CPAP   Stress    Swelling of both lower extremities     Her Past Surgical History Is Significant For: Past Surgical History:  Procedure Laterality Date   ELBOW LIGAMENT RECONSTRUCTION Right 08/21/2018   Procedure: LATERAL COLLATERAL LIGAMENT REPAIR;  Surgeon: Bjorn Pippin, MD;  Location: [facility];  Service: Orthopedics;  Laterality: Right;   FINGER SURGERY Left     index finger   KNEE SURGERY Left    LAPAROSCOPIC GASTRIC SLEEVE RESECTION N/A 05/31/2020   Procedure: LAPAROSCOPIC GASTRIC SLEEVE RESECTION;  Surgeon: Sheliah Hatch, De Blanch, MD;  Location: [facility];  Service: General;   Laterality: N/A;  2 HOURS TOTAL ROOM 1   LIVER BIOPSY N/A 05/15/2014   Procedure: LIVER BIOPSY;  Surgeon: Louis Meckel, MD;  Location: [facility];  Service: Endoscopy;  Laterality: N/A;  ultrasound to mark the liver   TENNIS ELBOW RELEASE/NIRSCHEL PROCEDURE Right 08/21/2018   Procedure: TENNIS ELBOW RELEASE/NIRSCHEL PROCEDURE;  Surgeon: Bjorn Pippin, MD;  Location: [facility];  Service: Orthopedics;  Laterality: Right;   TONSILLECTOMY AND ADENOIDECTOMY  1978   TYMPANOSTOMY TUBE PLACEMENT     ULNAR NERVE TRANSPOSITION Right 08/21/2018   Procedure: ULNAR NERVE DECOMPRESSION/TRANSPOSITION;  Surgeon: Bjorn Pippin, MD;  Location: [facility];  Service: Orthopedics;  Laterality: Right;   UPPER GI ENDOSCOPY N/A 05/31/2020   Procedure: UPPER GI ENDOSCOPY;  Surgeon: Sheliah Hatch, De Blanch, MD;  Location: [facility];  Service: General;  Laterality: N/A;   wrist cyst Left     Her Family History Is Significant For: Family History  Problem Relation Age of Onset   Diabetes Mother    Liver disease Mother        ESLD- unknown etiology   Obesity Mother    Kidney disease Mother    Hyperlipidemia Father    Migraines Sister    Diabetes Maternal Grandmother    Stroke Maternal Grandfather    Diabetes Paternal Grandmother    Breast cancer Paternal Grandmother     Her Social History Is Significant For: Social History   Socioeconomic History   Marital status: Married    Spouse name: Toney Sang  Number of children: 5   Years of education: in [place] program   Highest education level: Bachelor's degree (e.g., BA, AB, BS)  Occupational History   Occupation: Magazine features editor: [employer]    Comment: ESL  Tobacco Use   Smoking status: Never   Smokeless tobacco: Never  Vaping Use   Vaping Use: Never used  Substance and Sexual Activity   Alcohol use: No    Alcohol/week: 0.0 standard drinks of alcohol   Drug use: No   Sexual activity: Yes    Partners: Male    Birth control/protection: None     Comment: 1st intercourse 28yo-1 partner  Other Topics Concern   Not on file  Social History Narrative   Lives with her husband and their 5 adpoted children.    Adopted 2 sets of siblings, all with psychiatric diagnoses (ADHD, Asperger's, ODD, PTSD, depression, bipolar disorder).   Her husband has Asperger's, and works in disability services.   Occasionally drinks coke or pepsi    Social Determinants of Health   Financial Resource Strain: Not on file  Food Insecurity: Not on file  Transportation Needs: Not on file  Physical Activity: Not on file  Stress: Not on file  Social Connections: Not on file    Her Allergies Are:  Allergies  Allergen Reactions   Welchol [Colesevelam] Shortness Of Breath   Lidocaine Other (See Comments)    Flu like sx's   Pistachio Nut (Diagnostic) Itching and Swelling    Swollen tongue/itchy mouth   Relafen [Nabumetone] Other (See Comments)    migraine   Celebrex [Celecoxib] Rash   Sulfa Antibiotics Rash  :   Her Current Medications Are:  Outpatient Encounter Medications as of 06/28/2022  Medication Sig   buPROPion (WELLBUTRIN SR) 150 MG 12 hr tablet Take 1 tablet (150 mg total) by mouth 2 (two) times daily.   busPIRone (BUSPAR) 15 MG tablet TAKE ONE TABLET BY MOUTH TWICE A DAY   cetirizine (ZYRTEC) 10 MG tablet Take 10 mg by mouth at bedtime.    cholecalciferol (VITAMIN D) 25 MCG (1000 UNIT) tablet Take 4,000 Units by mouth daily.   citalopram (CELEXA) 20 MG tablet Take 1 tablet (20 mg total) by mouth daily. In addition to the 40mg  dose for a total of 60mg    citalopram (CELEXA) 40 MG tablet Take 1 tablet (40 mg total) by mouth daily.   Evolocumab (REPATHA SURECLICK) 140 MG/ML SOAJ Inject 1 pen every 14 days, must schedule OV for further refills.   fluticasone (FLONASE) 50 MCG/ACT nasal spray Place 2 sprays into both nostrils daily.   levothyroxine (SYNTHROID) 88 MCG tablet TAKE ONE TABLET BY MOUTH DAILY BEFORE BREAKFAST   LORazepam (ATIVAN) 1 MG  tablet TAKE ONE TABLET BY MOUTH TWICE A DAY AS NEEDED FOR ANXIETY   montelukast (SINGULAIR) 10 MG tablet TAKE ONE TABLET BY MOUTH EVERY NIGHT AT BEDTIME   Multiple Vitamin (MULTIVITAMIN WITH MINERALS) TABS tablet Take 1 tablet by mouth daily.   Omega-3 Fatty Acids (FISH OIL) 1200 MG CAPS Take 1,200 mg by mouth in the morning and at bedtime.   Polyethyl Glycol-Propyl Glycol (LUBRICANT EYE DROPS) 0.4-0.3 % SOLN Place 1 drop into both eyes 3 (three) times daily as needed (dry/irritated eyes).   topiramate (TOPAMAX) 100 MG tablet Take 1 tablet (100 mg total) by mouth at bedtime.   Ubrogepant (UBRELVY) 50 MG TABS Take 50 mg by mouth every 2 (two) hours as needed (may repeat once after 2  hours).   buPROPion (WELLBUTRIN SR) 150 MG 12 hr tablet Take 1 tablet (150 mg total) by mouth 2 (two) times daily.   fluticasone (FLONASE) 50 MCG/ACT nasal spray Place 2 sprays into both nostrils daily.   No facility-administered encounter medications on file as of 06/28/2022.  :  Review of Systems:  Out of a complete 14 point review of systems, all are reviewed and negative with the exception of these symptoms as listed below:  Review of Systems  Neurological:        Pt here for Headaches Pt states daily headaches . Pt states headache pain starts behind both eyes and temples    ESS:8     Objective:  Neurological Exam  Physical Exam Physical Examination:   Vitals:   06/28/22 0743  BP: (!) 151/82  Pulse: 66    General Examination: The patient is a very pleasant 47 y.o. female in no acute distress. She appears well-developed and well-nourished and well groomed.   HEENT: Normocephalic, atraumatic, pupils are equal, round and reactive to light, extraocular tracking is good without limitation to gaze excursion or nystagmus noted. Funduscopic exam is benign, she has corrective eyeglasses.  Hearing is grossly intact. Face is symmetric with normal facial animation. Speech is clear with no dysarthria noted.  There is no hypophonia. There is no lip, neck/head, jaw or voice tremor. Neck is supple with FROM. There are no carotid bruits on auscultation. Oropharynx exam reveals: mild mouth dryness, adequate dental hygiene. Tongue protrudes centrally and palate elevates symmetrically.  Normal tongue movements, no facial weakness.   Chest: Clear to auscultation without wheezing, rhonchi or crackles noted.   Heart: S1+S2+0, regular and normal without murmurs, rubs or gallops noted.    Abdomen: Soft, non-tender and non-distended with normal bowel sounds appreciated on auscultation.   Extremities: There is trace pitting edema in the distal lower extremities bilaterally.    Skin: Warm and dry without trophic changes noted.    Musculoskeletal: exam reveals no obvious joint deformities, evidence of right elbow surgery with 2 distinct scars.    Neurologically:  Mental status: The patient is awake, alert and oriented in all 4 spheres. Her immediate and remote memory, attention, language skills and fund of knowledge are appropriate. There is no evidence of aphasia, agnosia, apraxia or anomia. Speech is clear with normal prosody and enunciation. Thought process is linear. Mood is normal and affect is normal.  Cranial nerves II - XII are as described above under HEENT exam.  Motor exam: Normal bulk, strength and tone is noted. There is no action or postural or resting tremor.  Reflexes are 1+ in the upper extremities and trace in the lower extremities.  Toes are downgoing bilaterally.  Fine motor skills and coordination: grossly intact. Sensory exam: intact to light touch in the upper and lower extremities.  Gait, station and balance: She stands easily. No veering to one side is noted. No leaning to one side is noted. Posture is age-appropriate and stance is narrow based. Gait shows normal stride length and normal pace. No problems turning are noted.                 Assessment and Plan:  In summary, Melanie [name]  is a 47 year old right-handed woman with an underlying medical prediabetes, hyperlipidemia, reflux disease, depression, anxiety, allergies, anemia, lower extremity edema, OSA and morbid obesity with a BMI of over 45, who presents for follow-up consultation of her recurrent headaches and OSA.  She has not been  using her BiPAP for at least 1 month and had sporadic use before.  She is willing to get back on her BiPAP consistently.  I offered a pressure reduction for better tolerance but she declines it.  She does not need any new prescription for supplies and is encouraged to talk to her DME provider about the missing part of her headgear.  She is advised to try to get 7 to 8 hours of sleep, avoid sleeping in the recliner and avoid caffeine.  She is encouraged to limit her caffeine to 1 or maybe 2 servings per day.  She is encouraged to stay well-hydrated with water, work on stress reduction.  She is advised to increase her Topamax to 50 mg in the daytime and 100 mg at bedtime.  She is also advised to increase her Ubrelvy to 100 mg strength as needed.  I will change her prescriptions in that regard.   We talked about migraine triggers again today.  She is encouraged to keep her appointment with her eye doctor as well.  She is advised to follow-up to see Everlene Other, NP in about 4 months, sooner if needed.  I answered all her questions today and she was in agreement.   I spent 45 minutes in total face-to-face time and in reviewing records during pre-charting, more than 50% of which was spent in counseling and coordination of care, reviewing test results, reviewing medications and treatment regimen and/or in discussing or reviewing the diagnosis of migraine headaches, OSA, the prognosis and treatment options. Pertinent laboratory and imaging test results that were available during this visit with the patient were reviewed by me and considered in my medical decision making (see chart for details).

## 2022-07-06 ENCOUNTER — Other Ambulatory Visit: Payer: Self-pay

## 2022-07-06 ENCOUNTER — Telehealth: Payer: Self-pay | Admitting: Registered Nurse

## 2022-07-06 DIAGNOSIS — T7840XS Allergy, unspecified, sequela: Secondary | ICD-10-CM

## 2022-07-06 MED ORDER — CITALOPRAM HYDROBROMIDE 20 MG PO TABS: 20.0000 mg | ORAL_TABLET | Freq: Every day | ORAL | 1 refills | Status: DC

## 2022-07-06 MED ORDER — MONTELUKAST SODIUM 10 MG PO TABS: 10.0000 mg | ORAL_TABLET | Freq: Every day | ORAL | 0 refills | Status: DC

## 2022-07-06 MED ORDER — CITALOPRAM HYDROBROMIDE 20 MG PO TABS: 20.0000 mg | ORAL_TABLET | Freq: Every day | ORAL | 0 refills | Status: DC

## 2022-07-06 NOTE — Telephone Encounter (Signed)
Called pt to make her aware . Left vm

## 2022-07-06 NOTE — Telephone Encounter (Signed)
RX resent . Called pt and left vm.

## 2022-07-06 NOTE — Telephone Encounter (Signed)
Encourage patient to contact the pharmacy for refills or they can request refills through [facility]  (Please schedule appointment if patient has not been seen in over a year)    WHAT PHARMACY WOULD THEY LIKE THIS SENT TO:  [facility] [id number] - [place], [place] - [address]    MEDICATION NAME & DOSE:citalopram (CELEXA) 20 MG tablet and montelukast (SINGULAIR) 10 MG tablet   NOTES/COMMENTS FROM PATIENT: patient called and reschedule(07/21/22) her NP appt with JoAnna. Patient tested positive for Covid(last night). Patient states she is out of medication. Patient wants know if she can get refills on these medications.      Front office please notify patient: It takes 48-72 hours to process rx refill requests Ask patient to call pharmacy to ensure rx is ready before heading there.

## 2022-07-06 NOTE — Telephone Encounter (Signed)
Sent in 30 day refills for this pt. Pt has upcoming appt at 07/21/2022

## 2022-07-07 ENCOUNTER — Encounter: Payer: Self-pay | Admitting: Family Medicine

## 2022-07-07 ENCOUNTER — Telehealth (INDEPENDENT_AMBULATORY_CARE_PROVIDER_SITE_OTHER): Payer: BC Managed Care – PPO | Admitting: Family Medicine

## 2022-07-07 ENCOUNTER — Ambulatory Visit: Payer: BC Managed Care – PPO | Admitting: Family Medicine

## 2022-07-07 VITALS — Temp 100.0°F | Ht 68.0 in | Wt 275.0 lb

## 2022-07-07 DIAGNOSIS — U071 COVID-19: Secondary | ICD-10-CM

## 2022-07-07 MED ORDER — DOXYCYCLINE HYCLATE 100 MG PO TABS: 100.0000 mg | ORAL_TABLET | Freq: Two times a day (BID) | ORAL | 0 refills | Status: AC

## 2022-07-07 NOTE — Telephone Encounter (Signed)
Pt is aware.  

## 2022-07-07 NOTE — Progress Notes (Signed)
Virtual Visit via Video Note  I connected with [name] on 07/07/22 at 12:03 PM by a video enabled telemedicine application and verified that I am speaking with the correct person using two identifiers. Call ended at 11:32 am.   Patient Location: Home Provider Location: office - [facility].    I discussed the limitations, risks, security and privacy concerns of performing an evaluation and management service by telephone and the availability of in person appointments. I also discussed with the patient that there may be a patient responsible charge related to this service. The patient expressed understanding and agreed to proceed, consent obtained  Chief Complaint  Patient presents with   Covid Positive    Pt reports she tested positive for COVID Thursday Sx -bodyache, fever , cough , headache,sob execration.     History of Present Illness: Melanie [name] is a 47 y.o. female that tested positive for covid yesterday, Thursday 04/26, with a home test.  On Tuesday or Wednesday, started feeling "run down". On Wednesday night, fever (102.8), frontal headache, chills, body aches, sore throat, non-productive cough, shortness of breath on exertion, wheezing, sinus pressure, or bilateral ear pain with no drainage.    Denies chest pain.   Patient reports she been taking Tylenol Cold yesterday, didn't help a lot.  Then, started only Tylenol for fever. She reports it has decreased her fever.   Patient Active Problem List   Diagnosis Date Noted   Hypothyroid 12/29/2021   Vitamin D deficiency 12/26/2018   Class 3 severe obesity with serious comorbidity and body mass index (BMI) of 45.0 to 49.9 in adult [facility]) 12/26/2018   Right tennis elbow 07/30/2018   Cubital tunnel syndrome 07/30/2018   Chronic pain of both feet 01/23/2018   Bilateral calcaneal spurs 01/23/2018   Prediabetes 05/23/2017   Liver fibrosis 05/23/2017   Hyperlipidemia 04/16/2017   OSA (obstructive sleep apnea)  11/23/2015   NASH (nonalcoholic steatohepatitis) 08/27/2015   Dependent edema 08/27/2015   Obesity (BMI 30-39.9) 03/15/2015   Hepatic fibrosis 06/08/2014   Depression with anxiety 06/06/2013   Perennial allergic rhinitis 06/06/2013   Past Medical History:  Diagnosis Date   Allergy    Anemia    Anxiety    Arthritis    Back pain    Constipation    COVID-19 05/27/2019   Depression    Fatty liver    GERD (gastroesophageal reflux disease)    Hyperlipidemia    Hyperthyroidism    Infertility, female    Joint pain    Kidney problem    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    Migraines    LIGHT SENSITIVITY , NOW ON IMITREX SINCE 08-02-2018,NOTICES IMPROVEMENT    Multiple food allergies    Muscle pain    Numbness and tingling in both hands    Pre-diabetes    Shortness of breath    was due to a medication side effect, now off medication , SOB resolved    Sleep apnea    CPAP   Stress    Swelling of both lower extremities    Past Surgical History:  Procedure Laterality Date   ELBOW LIGAMENT RECONSTRUCTION Right 08/21/2018   Procedure: LATERAL COLLATERAL LIGAMENT REPAIR;  Surgeon: Bjorn Pippin, MD;  Location: [facility];  Service: Orthopedics;  Laterality: Right;   FINGER SURGERY Left     index finger   KNEE SURGERY Left    LAPAROSCOPIC GASTRIC SLEEVE RESECTION N/A 05/31/2020   Procedure:  LAPAROSCOPIC GASTRIC SLEEVE RESECTION;  Surgeon: Kinsinger, De Blanch, MD;  Location: [facility];  Service: General;  Laterality: N/A;  2 HOURS TOTAL ROOM 1   LIVER BIOPSY N/A 05/15/2014   Procedure: LIVER BIOPSY;  Surgeon: Louis Meckel, MD;  [facility]: [facility];  Service: Endoscopy;  Laterality: N/A;  ultrasound to mark the liver   TENNIS ELBOW RELEASE/NIRSCHEL PROCEDURE Right 08/21/2018   Procedure: TENNIS ELBOW RELEASE/NIRSCHEL PROCEDURE;  Surgeon: Bjorn Pippin, MD;  [facility];  Service: Orthopedics;  Laterality: Right;   TONSILLECTOMY AND ADENOIDECTOMY  1978    TYMPANOSTOMY TUBE PLACEMENT     ULNAR NERVE TRANSPOSITION Right 08/21/2018   Procedure: ULNAR NERVE DECOMPRESSION/TRANSPOSITION;  Surgeon: Bjorn Pippin, MD;  [facility];  Service: Orthopedics;  Laterality: Right;   UPPER GI ENDOSCOPY N/A 05/31/2020   Procedure: UPPER GI ENDOSCOPY;  Surgeon: Sheliah Hatch, De Blanch, MD;  [facility]: [facility];  Service: General;  Laterality: N/A;   wrist cyst Left    Allergies  Allergen Reactions   Welchol [Colesevelam] Shortness Of Breath   Lidocaine Other (See Comments)    Flu like sx's   Pistachio Nut (Diagnostic) Itching and Swelling    Swollen tongue/itchy mouth   Relafen [Nabumetone] Other (See Comments)    migraine   Celebrex [Celecoxib] Rash   Sulfa Antibiotics Rash   Prior to Admission medications   Medication Sig Start Date End Date Taking? Authorizing Provider  buPROPion (WELLBUTRIN SR) 150 MG 12 hr tablet Take 1 tablet (150 mg total) by mouth 2 (two) times daily. 12/28/21  Yes Sheliah Hatch, MD  buPROPion [facility] SR) 150 MG 12 hr tablet Take 1 tablet (150 mg total) by mouth 2 (two) times daily. 12/29/21  Yes Sheliah Hatch, MD  busPIRone (BUSPAR) 15 MG tablet TAKE ONE TABLET BY MOUTH TWICE A DAY 06/30/21  Yes [name], NP  cetirizine (ZYRTEC) 10 MG tablet Take 10 mg by mouth at bedtime.    Yes [provider]  citalopram (CELEXA) 20 MG tablet Take 1 tablet (20 mg total) by mouth daily. In addition to the 40mg  dose for a total of 60mg  07/06/22  Yes Alveria Apley, NP  citalopram (CELEXA) 40 MG tablet Take 1 tablet (40 mg total) by mouth daily. 02/16/22  Yes Sheliah Hatch, MD  Evolocumab (REPATHA SURECLICK) 140 MG/ML SOAJ Inject 1 pen every 14 days, must schedule OV for further refills. 01/11/22  Yes Hilty, Lisette Abu, MD  fluticasone (FLONASE) 50 MCG/ACT nasal spray Place 2 sprays into both nostrils daily. 05/21/20  Yes [name], NP  levothyroxine (SYNTHROID) 88 MCG tablet TAKE ONE TABLET BY MOUTH DAILY  BEFORE BREAKFAST 02/16/22  Yes Sheliah Hatch, MD  LORazepam (ATIVAN) 1 MG tablet TAKE ONE TABLET BY MOUTH TWICE A DAY AS NEEDED FOR ANXIETY 10/03/21  Yes [name], NP  montelukast (SINGULAIR) 10 MG tablet Take 1 tablet (10 mg total) by mouth at bedtime. 07/06/22 08/05/22 Yes Alveria Apley, NP  Multiple Vitamin (MULTIVITAMIN WITH MINERALS) TABS tablet Take 1 tablet by mouth daily.   Yes [provider]  Omega-3 Fatty Acids (FISH OIL) 1200 MG CAPS Take 1,200 mg by mouth in the morning and at bedtime.   Yes [provider]  Polyethyl Glycol-Propyl Glycol (LUBRICANT EYE DROPS) 0.4-0.3 % SOLN Place 1 drop into both eyes 3 (three) times daily as needed (dry/irritated eyes).   Yes [provider]  topiramate (TOPAMAX) 100 MG tablet Half a pill in the morning  and 1 pill at bedtime daily. 06/28/22  Yes Huston Foley, MD  Ubrogepant (UBRELVY) 100 MG TABS Take 1 tablet (100 mg total) by mouth as needed. May repeat in 2 hours if needed, no more than 2 pills in 24 hours. 06/28/22  Yes Huston Foley, MD  cholecalciferol (VITAMIN D) 25 MCG (1000 UNIT) tablet Take 4,000 Units by mouth daily. Patient not taking: Reported on 07/07/2022    [provider]  fluticasone (FLONASE) 50 MCG/ACT nasal spray Place 2 sprays into both nostrils daily. Patient not taking: Reported on 07/07/2022 12/29/21   Sheliah Hatch, MD  metFORMIN (GLUCOPHAGE) 500 MG tablet Take 500 mg by mouth 2 (two) times daily. 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    Adopted 2 sets of siblings, all with psychiatric diagnoses (ADHD, Asperger's, ODD, PTSD, depression, bipolar disorder).   Her husband has Asperger's, and works in disability services.   Occasionally drinks coke or pepsi    Social Determinants of Health   Financial Resource Strain: Not on file  Food Insecurity: Not on file  Transportation Needs: Not on file  Physical Activity: Not on file  Stress: Not on file  Social Connections: Not on file  Intimate Partner Violence: Not on file    Observations/Objective: Today's Vitals   07/07/22 1045  Temp: 100 F (37.8 C)  Weight: 275 lb (124.7 kg)  Height: 5\' 8"  (1.727 m)   Physical Exam Constitutional:      General: She is not in acute distress.    Appearance: Normal appearance. She is obese. She is ill-appearing (Mild). She is not toxic-appearing or diaphoretic.  HENT:     Head: Normocephalic and atraumatic.  Eyes:     General:        Right eye: No discharge.        Left eye: No discharge.     Conjunctiva/sclera: Conjunctivae normal.     Comments: Wearing glasses.   Pulmonary:     Effort: Pulmonary effort is normal. No respiratory distress.     Comments: Patient is able to talk in full sentences with no obvious distress.  Skin:    General: Skin is dry.  Neurological:     Mental Status: She is alert and oriented to person, place, and time.  Psychiatric:        Mood and Affect: Mood normal.        Behavior: Behavior normal.        Thought Content: Thought content normal.        Judgment: Judgment normal.  Assessment and Plan: COVID  Other  orders -     Doxycycline Hyclate; Take 1 tablet (100 mg total) by mouth 2 (two) times daily for 7 days.  Dispense: 14 tablet; Refill: 0  1.Through shared decision making, decided to go with antibiotic for treatment of any secondary infections from covid versus taking an antiviral.  2. Discussed about taking vitamin C 1000mg  and Zinc 50-100mg  to support immune system.  3. Stay hydrated.  4. Stay upright when awake and take frequent deep breaths 5. Dicussed about taking Tylenol and Ibuprofen for fever control and OTC Mucinex or Tylenol Sinus to help with symptoms.  6. Discussed about red flags to go the emergency department. 7.Patient expressed understanding and is in agreement with plan.  Follow Up Instructions: Patient has an office visit on 05/10.    I discussed the assessment and treatment plan with the patient. The patient was provided an opportunity to ask questions and all were answered. The patient agreed with the plan and demonstrated an understanding of the instructions.   The patient was advised to call back or seek an in-person evaluation if the symptoms worsen or if the condition fails to improve as anticipated.  Zandra Abts, NP

## 2022-07-10 ENCOUNTER — Encounter: Payer: Self-pay | Admitting: Family Medicine

## 2022-07-11 ENCOUNTER — Encounter: Payer: Self-pay | Admitting: Family Medicine

## 2022-07-11 ENCOUNTER — Ambulatory Visit (INDEPENDENT_AMBULATORY_CARE_PROVIDER_SITE_OTHER)
Admission: RE | Admit: 2022-07-11 | Discharge: 2022-07-11 | Disposition: A | Discharge status: Home / Self Care | Payer: BC Managed Care – PPO | Source: Ambulatory Visit | Attending: Family Medicine | Admitting: Family Medicine

## 2022-07-11 ENCOUNTER — Telehealth (INDEPENDENT_AMBULATORY_CARE_PROVIDER_SITE_OTHER): Payer: BC Managed Care – PPO | Admitting: Family Medicine

## 2022-07-11 ENCOUNTER — Telehealth: Payer: Self-pay

## 2022-07-11 VITALS — Temp 97.2°F | Ht 68.0 in | Wt 275.0 lb

## 2022-07-11 DIAGNOSIS — R051 Acute cough: Secondary | ICD-10-CM

## 2022-07-11 DIAGNOSIS — U071 COVID-19: Secondary | ICD-10-CM

## 2022-07-11 DIAGNOSIS — R0602 Shortness of breath: Secondary | ICD-10-CM

## 2022-07-11 MED ORDER — ALBUTEROL SULFATE HFA 108 (90 BASE) MCG/ACT IN AERS: 2.0000 | INHALATION_SPRAY | Freq: Four times a day (QID) | RESPIRATORY_TRACT | 0 refills | Status: DC | PRN

## 2022-07-11 NOTE — Telephone Encounter (Signed)
Error

## 2022-07-11 NOTE — Telephone Encounter (Signed)
Changed to MyChart visit.

## 2022-07-11 NOTE — Progress Notes (Signed)
Virtual Visit via Video Note  I connected with Melanie [name] on 07/11/22 at 2:11pm by a video enabled telemedicine application and verified that I am speaking with the correct person using two identifiers.   Patient Location: Home Provider Location: office - [facility].    I discussed the limitations, risks, security and privacy concerns of performing an evaluation and management service by telephone and the availability of in person appointments. I also discussed with the patient that there may be a patient responsible charge related to this service. The patient expressed understanding and agreed to proceed, consent obtained  Chief Complaint  Patient presents with   Covid Exposure    Pt reports a lot of wheezing in her chest and coughing. Pt reports she is becoming dizzy when standing.    History of Present Illness: Melanie [name] is a 47 y.o. female that was covid positive on 07/07/2022.She was prescribed Doxycycline 100mg  BID for 7 days for any secondary infections she may have from covid. She reports she has been taking this medication. Patient reports she has had some new symptoms: loud wheezing, dizzy, and productive cough. She reports she has been watching her oxygen levels, around 93-94%.She reports the had a low grade fever of 99.0, nearly 100.0 last night. She reports she is experiencing pulmonary chest pain. Denies cardiac chest pain. She reports it is an achy pain. She reports a little bit of shortness of breath when talking. She reports when she talks a lot, she will start coughing, and developing shortness of breath.   Patient Active Problem List   Diagnosis Date Noted   Hypothyroid 12/29/2021   Vitamin D deficiency 12/26/2018   Class 3 severe obesity with serious comorbidity and body mass index (BMI) of 45.0 to 49.9 in adult [facility]) 12/26/2018   Right tennis elbow 07/30/2018   Cubital tunnel syndrome 07/30/2018   Chronic pain of both feet 01/23/2018   Bilateral  calcaneal spurs 01/23/2018   Prediabetes 05/23/2017   Liver fibrosis 05/23/2017   Hyperlipidemia 04/16/2017   OSA (obstructive sleep apnea) 11/23/2015   NASH (nonalcoholic steatohepatitis) 08/27/2015   Dependent edema 08/27/2015   Obesity (BMI 30-39.9) 03/15/2015   Hepatic fibrosis 06/08/2014   Depression with anxiety 06/06/2013   Perennial allergic rhinitis 06/06/2013   Past Medical History:  Diagnosis Date   Allergy    Anemia    Anxiety    Arthritis    Back pain    Constipation    COVID-19 05/27/2019   Depression    Fatty liver    GERD (gastroesophageal reflux disease)    Hyperlipidemia    Hyperthyroidism    Infertility, female    Joint pain    Kidney problem    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    Migraines    LIGHT SENSITIVITY , NOW ON IMITREX SINCE 08-02-2018,NOTICES IMPROVEMENT    Multiple food allergies    Muscle pain    Numbness and tingling in both hands    Pre-diabetes    Shortness of breath    was due to a medication side effect, now off medication , SOB resolved    Sleep apnea    CPAP   Stress    Swelling of both lower extremities    Past Surgical History:  Procedure Laterality Date   ELBOW LIGAMENT RECONSTRUCTION Right 08/21/2018   Procedure: LATERAL COLLATERAL LIGAMENT REPAIR;  Surgeon: Bjorn Pippin, MD;  [facility];  Service: Orthopedics;  Laterality: Right;  FINGER SURGERY Left     index finger   KNEE SURGERY Left    LAPAROSCOPIC GASTRIC SLEEVE RESECTION N/A 05/31/2020   Procedure: LAPAROSCOPIC GASTRIC SLEEVE RESECTION;  Surgeon: Sheliah Hatch, De Blanch, MD;  Location[facility];  Service: General;  Laterality: N/A;  2 HOURS TOTAL ROOM 1   LIVER BIOPSY N/A 05/15/2014   Procedure: LIVER BIOPSY;  Surgeon: Louis Meckel, MD;  [facility];  Service: Endoscopy;  Laterality: N/A;  ultrasound to mark the liver   TENNIS ELBOW RELEASE/NIRSCHEL PROCEDURE Right 08/21/2018   Procedure: TENNIS ELBOW RELEASE/NIRSCHEL PROCEDURE;   Surgeon: Bjorn Pippin, MD;  [facility];  Service: Orthopedics;  Laterality: Right;   TONSILLECTOMY AND ADENOIDECTOMY  1978   TYMPANOSTOMY TUBE PLACEMENT     ULNAR NERVE TRANSPOSITION Right 08/21/2018   Procedure: ULNAR NERVE DECOMPRESSION/TRANSPOSITION;  Surgeon: Bjorn Pippin, MD;  [facility];  Service: Orthopedics;  Laterality: Right;   UPPER GI ENDOSCOPY N/A 05/31/2020   Procedure: UPPER GI ENDOSCOPY;  Surgeon: Sheliah Hatch, De Blanch, MD;  [facility];  Service: General;  Laterality: N/A;   wrist cyst Left    Allergies  Allergen Reactions   Welchol [Colesevelam] Shortness Of Breath   Lidocaine Other (See Comments)    Flu like sx's   Pistachio Nut (Diagnostic) Itching and Swelling    Swollen tongue/itchy mouth   Relafen [Nabumetone] Other (See Comments)    migraine   Celebrex [Celecoxib] Rash   Sulfa Antibiotics Rash   Prior to Admission medications   Medication Sig Start Date End Date Taking? Authorizing Provider  buPROPion (WELLBUTRIN SR) 150 MG 12 hr tablet Take 1 tablet (150 mg total) by mouth 2 (two) times daily. 12/29/21  Yes Sheliah Hatch, MD  busPIRone (BUSPAR) 15 MG tablet TAKE ONE TABLET BY MOUTH TWICE A DAY 06/30/21  Yes [name], NP  Calcium Carbonate (CALCIUM 500 PO) Take 1 tablet by mouth daily.   Yes [provider]  cetirizine (ZYRTEC) 10 MG tablet Take 10 mg by mouth at bedtime.    Yes [provider]  citalopram (CELEXA) 20 MG tablet Take 1 tablet (20 mg total) by mouth daily. In addition to the 40mg  dose for a total of 60mg  07/06/22  Yes Alveria Apley, NP  citalopram (CELEXA) 40 MG tablet Take 1 tablet (40 mg total) by mouth daily. 02/16/22  Yes Sheliah Hatch, MD  doxycycline (VIBRA-TABS) 100 MG tablet Take 1 tablet (100 mg total) by mouth 2 (two) times daily for 7 days. 07/07/22 07/14/22 Yes Alveria Apley, NP  Evolocumab (REPATHA SURECLICK) 140 MG/ML SOAJ Inject 1 pen every 14 days, must schedule OV for  further refills. 01/11/22  Yes Hilty, Lisette Abu, MD  Fish Oil-Cholecalciferol (OMEGA-3 + VITAMIN D3 PO) Take 1 tablet by mouth daily. 4,000IU of vitamin D3 115mg  of Omega-3   Yes [provider]  fluticasone (FLONASE) 50 MCG/ACT nasal spray Place 2 sprays into both nostrils daily. 12/29/21  Yes Sheliah Hatch, MD  levothyroxine (SYNTHROID) 88 MCG tablet TAKE ONE TABLET BY MOUTH DAILY BEFORE BREAKFAST 02/16/22  Yes Sheliah Hatch, MD  LORazepam (ATIVAN) 1 MG tablet TAKE ONE TABLET BY MOUTH TWICE A DAY AS NEEDED FOR ANXIETY 10/03/21  Yes [name], NP  montelukast (SINGULAIR) 10 MG tablet Take 1 tablet (10 mg total) by mouth at bedtime. 07/06/22 08/05/22 Yes Alveria Apley, NP  Multiple Vitamin (MULTIVITAMIN WITH MINERALS) TABS tablet Take 1 tablet by mouth daily.  Yes [provider]  Polyethyl Glycol-Propyl Glycol (LUBRICANT EYE DROPS) 0.4-0.3 % SOLN Place 1 drop into both eyes 3 (three) times daily as needed (dry/irritated eyes).   Yes [provider]  topiramate (TOPAMAX) 100 MG tablet Half a pill in the morning and 1 pill at bedtime daily. 06/28/22  Yes Huston Foley, MD  Ubrogepant (UBRELVY) 100 MG TABS Take 1 tablet (100 mg total) by mouth as needed. May repeat in 2 hours if needed, no more than 2 pills in 24 hours. 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    Adopted 2 sets of siblings, all with psychiatric diagnoses (ADHD, Asperger's, ODD, PTSD, depression, bipolar disorder).   Her husband has Asperger's, and works in disability services.   Occasionally drinks coke or pepsi    Social Determinants of Health   Financial Resource Strain: Not on file  Food Insecurity: Not on file  Transportation Needs: Not on file  Physical Activity: Not on file  Stress: Not on file  Social Connections: Not on file  Intimate Partner Violence: Not on file    Observations/Objective: Today's Vitals   07/11/22 1345  Temp: (!) 97.2 F (36.2 C)  SpO2: 95%  Weight: 275 lb (124.7 kg)  Height: 5\' 8"  (1.727 m)   Physical Exam Vitals reviewed.  Constitutional:      General: She is not in acute distress.    Appearance: Normal appearance. She is ill-appearing (Mild). She is not toxic-appearing or diaphoretic.  Eyes:     General:        Right eye: No discharge.        Left eye: No discharge.     Conjunctiva/sclera: Conjunctivae normal.  Cardiovascular:     Heart sounds: Normal heart sounds. No murmur heard.    No friction rub. No gallop.  Pulmonary:     Effort: Pulmonary effort is normal. No respiratory distress.     Comments: Sound hoarse, able to speak in full sentences without stopping to cough or take a breath.  Skin:    General: Skin is dry.  Neurological:     General: No focal deficit present.     Mental Status: She is alert and oriented to person, place, and time. Mental status is at baseline.  Psychiatric:        Mood and Affect: Mood normal.        Behavior: Behavior normal.        Thought Content: Thought content normal.        Judgment: Judgment normal.     Comments: Smiling at end of visit  Assessment and Plan: COVID -     DG Chest 2 View; Future  Shortness of  breath -     DG Chest 2 View; Future -     Albuterol Sulfate HFA; Inhale 2 puffs into the lungs every 6 (six) hours as needed for wheezing or shortness of breath.  Dispense: 8 g; Refill: 0  Acute cough -     DG Chest 2 View; Future -     Albuterol Sulfate HFA; Inhale 2 puffs into the lungs every 6 (six) hours as needed for wheezing or shortness of breath.  Dispense: 8 g; Refill: 0   -Ordered a STAT chest x-ray for covid, shortness of breath, and wheezing. Advised to go to [facility] location for chest x-ray. Informed patient she would be contacted once the x-ray had resulted.  -Recommend to continue taking Doxycycline.  -Recommend to stay hydrated, 64-100oz fluid a day.  -Prescribed Albuterol Inhaler as needed for wheezing and shortness of breath.  -Advised if her symptoms became worse: chest pain, SHOB, or oxygen level decreased to 90% or less, she needed to go to the emergency department.  Follow Up Instructions:   I discussed the assessment and treatment plan with the patient. The patient was provided an opportunity to ask questions and all were answered. The patient agreed with the plan and demonstrated an understanding of the instructions.   The patient was advised to call back or seek an in-person evaluation if the symptoms worsen or if the condition fails to improve as anticipated.  Zandra Abts, NP

## 2022-07-11 NOTE — Telephone Encounter (Signed)
Great, Thank you!

## 2022-07-21 ENCOUNTER — Encounter: Payer: Self-pay | Admitting: Family Medicine

## 2022-07-21 ENCOUNTER — Ambulatory Visit: Payer: BC Managed Care – PPO | Admitting: Family Medicine

## 2022-07-21 VITALS — BP 162/108 | HR 64 | Temp 98.7°F | Ht 68.0 in | Wt 291.5 lb

## 2022-07-21 DIAGNOSIS — J3089 Other allergic rhinitis: Secondary | ICD-10-CM

## 2022-07-21 DIAGNOSIS — E7849 Other hyperlipidemia: Secondary | ICD-10-CM

## 2022-07-21 DIAGNOSIS — Z1231 Encounter for screening mammogram for malignant neoplasm of breast: Secondary | ICD-10-CM

## 2022-07-21 DIAGNOSIS — E039 Hypothyroidism, unspecified: Secondary | ICD-10-CM | POA: Diagnosis not present

## 2022-07-21 DIAGNOSIS — F418 Other specified anxiety disorders: Secondary | ICD-10-CM | POA: Diagnosis not present

## 2022-07-21 DIAGNOSIS — R030 Elevated blood-pressure reading, without diagnosis of hypertension: Secondary | ICD-10-CM

## 2022-07-21 DIAGNOSIS — Z1211 Encounter for screening for malignant neoplasm of colon: Secondary | ICD-10-CM

## 2022-07-21 DIAGNOSIS — Z1283 Encounter for screening for malignant neoplasm of skin: Secondary | ICD-10-CM

## 2022-07-21 DIAGNOSIS — Z7689 Persons encountering health services in other specified circumstances: Secondary | ICD-10-CM

## 2022-07-21 NOTE — Patient Instructions (Addendum)
It was a pleasure to meet you and I look forward to taking care of you.  -Ordered mammogram scan for breast cancer screening.  -Placed a referral to GI for colonoscopy for colon cancer screening.  -Placed a referral to dermatology for full body skin cancer screening.  -Ordered screening labs. Please make a lab appointment when fasting. Office will call with lab results and you may see them on MyChart.  -If you do not hear about appointments for mammogram or referrals within 2 weeks, please call back to the office.  -Blood pressure is elevated today, recommend to monitor your blood pressure twice a day, once in the AM and once in the PM. Bring in readings in 2 weeks. Prefer an upper arm blood pressure machine.  -Follow up in 2 weeks.

## 2022-07-21 NOTE — Assessment & Plan Note (Signed)
Stable. Continue with Bupropion 150mg  BID, Buspirone 15mg  BID, Citalopram 60mg  daily (two different strengths-40mg  and 20mg ) and Lorazepam 1mg  BID as needed. Denies any SI or HI. She is aware before Lorazepam can be refilled from this provider, she will need a controlled substance agreement signed and UDS completed. She reports she usually takes about once a month.

## 2022-07-21 NOTE — Assessment & Plan Note (Signed)
Stable. Continue with Cetirizine 10mg  daily, Fluticasone nasal spray, and Montelukast 10mg  daily.

## 2022-07-21 NOTE — Assessment & Plan Note (Signed)
Managed by cardiologist, but not had an appointment in a while. Ordered lipid panel since has not been done since 12/2021 with significant elevated results. Patient will come back for a lab visit when fasting.

## 2022-07-21 NOTE — Progress Notes (Signed)
New Patient Office Visit  Subjective    Patient ID: Melanie [name], female    DOB: 1975-06-21  Age: 47 y.o. MRN: [id number]  CC:  Chief Complaint  Patient presents with   Establish Care    Pt is here to [facility] Pt is FASTING    HPI Melanie [name] presents to establish care with new provider and chronic management.  Patients previous primary care provider was Melanie Agee, NP.  Last appointment was 11/2020.  Specialist: Cardiologist- [facility] with Dr. Zoila [name]  Neurologist-Melanie [name] with Dr. Huston Foley   Hypothyroidism: Chronic: Patient is taking Levothyroxine daily. Last TSH was 5.16, normal, on 12/29/2021. Denies any hypothyroidism symptoms.   Depression/Anxiety: Chronic. Patient is taking Bupropion 150mg  BID, Buspirone 15mg  BID, Citalopram 60mg  daily (Two different strengths- 40mg  and 20mg ) and Lorazepam 1mg  BID as needed. Effective. Denies SI and HI. Lorazepam she usually takes about once a month.   Allergies: Chronic. Patient is taking Cetirizine 10mg  daily, Fluticasone nasal spray, and Montelukast 10mg  daily. Effective.   Hyperlipidemia: Managed by cardiology. Takes Evolocumab injections and Fish Oil.  Lab Results  Component Value Date   CHOL 436 (H) 12/29/2021   HDL 69.00 12/29/2021   LDLCALC 157 (H) 01/04/2021   LDLDIRECT 307.0 12/29/2021   TRIG 283.0 (H) 12/29/2021   CHOLHDL 6 12/29/2021    Outpatient Encounter Medications as of 07/21/2022  Medication Sig   albuterol (VENTOLIN HFA) 108 (90 Base) MCG/ACT inhaler Inhale 2 puffs into the lungs every 6 (six) hours as needed for wheezing or shortness of breath.   buPROPion (WELLBUTRIN SR) 150 MG 12 hr tablet Take 1 tablet (150 mg total) by mouth 2 (two) times daily.   busPIRone (BUSPAR) 15 MG tablet TAKE ONE TABLET BY MOUTH TWICE A DAY   Calcium Carbonate (CALCIUM 500 PO) Take 1 tablet by mouth daily.   cetirizine (ZYRTEC) 10 MG tablet Take 10 mg  by mouth at bedtime.    citalopram (CELEXA) 20 MG tablet Take 1 tablet (20 mg total) by mouth daily. In addition to the 40mg  dose for a total of 60mg    Evolocumab (REPATHA SURECLICK) 140 MG/ML SOAJ Inject 1 pen every 14 days, must schedule OV for further refills.   Fish Oil-Cholecalciferol (OMEGA-3 + VITAMIN D3 PO) Take 1 tablet by mouth daily. 4,000IU of vitamin D3 115mg  of Omega-3   fluticasone (FLONASE) 50 MCG/ACT nasal spray Place 2 sprays into both nostrils daily.   levothyroxine (SYNTHROID) 88 MCG tablet TAKE ONE TABLET BY MOUTH DAILY BEFORE BREAKFAST   LORazepam (ATIVAN) 1 MG tablet TAKE ONE TABLET BY MOUTH TWICE A DAY AS NEEDED FOR ANXIETY   montelukast (SINGULAIR) 10 MG tablet Take 1 tablet (10 mg total) by mouth at bedtime.   Multiple Vitamin (MULTIVITAMIN WITH MINERALS) TABS tablet Take 1 tablet by mouth daily.   Polyethyl Glycol-Propyl Glycol (LUBRICANT EYE DROPS) 0.4-0.3 % SOLN Place 1 drop into both eyes 3 (three) times daily as needed (dry/irritated eyes).   topiramate (TOPAMAX) 100 MG tablet Half a pill in the morning and 1 pill at bedtime daily.   Ubrogepant (UBRELVY) 100 MG TABS Take 1 tablet (100 mg total) by mouth as needed. May repeat in 2 hours if needed, no more than 2 pills in 24 hours.   citalopram (CELEXA) 40 MG tablet Take 1 tablet (40 mg total) by mouth daily.   No facility-administered encounter medications on file as of 07/21/2022.    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    Migraines    LIGHT SENSITIVITY , NOW ON IMITREX SINCE 08-02-2018,NOTICES IMPROVEMENT    Multiple food allergies    Muscle pain    Numbness and tingling in both hands     Pre-diabetes    Shortness of breath    was due to a medication side effect, now off medication , SOB resolved    Sleep apnea    CPAP   Stress    Swelling of both lower extremities     Past Surgical History:  Procedure Laterality Date   ELBOW LIGAMENT RECONSTRUCTION Right 08/21/2018   Procedure: LATERAL COLLATERAL LIGAMENT REPAIR;  Surgeon: Bjorn Pippin, MD;  Location: [facility];  Service: Orthopedics;  Laterality: Right;   FINGER SURGERY Left     index finger   KNEE SURGERY Left    LAPAROSCOPIC GASTRIC SLEEVE RESECTION N/A 05/31/2020   Procedure: LAPAROSCOPIC GASTRIC SLEEVE RESECTION;  Surgeon: Melanie [name], De Blanch, MD;  Location: [facility];  Service: General;  Laterality: N/A;  2 HOURS TOTAL ROOM 1   LIVER BIOPSY N/A 05/15/2014   Procedure: LIVER BIOPSY;  Surgeon: Melanie Meckel, MD;  Location: [facility];  Service: Endoscopy;  Laterality: N/A;  ultrasound to mark the liver   TENNIS ELBOW RELEASE/NIRSCHEL PROCEDURE Right 08/21/2018   Procedure: TENNIS ELBOW RELEASE/NIRSCHEL PROCEDURE;  Surgeon: Bjorn Pippin, MD;  Location: [facility];  Service: Orthopedics;  Laterality: Right;   TONSILLECTOMY AND ADENOIDECTOMY  1978   TYMPANOSTOMY TUBE PLACEMENT     ULNAR NERVE TRANSPOSITION Right 08/21/2018   Procedure: ULNAR NERVE DECOMPRESSION/TRANSPOSITION;  Surgeon: Bjorn Pippin, MD;  Location: [facility];  Service: Orthopedics;  Laterality: Right;   UPPER GI ENDOSCOPY N/A 05/31/2020   Procedure: UPPER GI ENDOSCOPY;  Surgeon: Melanie [name], De Blanch, MD;  Location: [facility];  Service: General;  Laterality: N/A;   wrist cyst Left     Family History  Problem Relation Age of Onset   Diabetes Mother    Liver disease Mother        ESLD- unknown etiology   Obesity Mother    Kidney disease Mother    Colon cancer Mother    Hyperlipidemia Father    Melanoma Father    Migraines Sister    Bipolar disorder Brother    COPD Paternal Aunt    COPD Paternal Aunt    Diabetes Maternal Grandmother    Stroke Maternal  Grandfather    Diabetes Paternal Grandmother    Breast cancer Paternal Grandmother     Social History   Socioeconomic History   Marital status: Married    Spouse name: [name]   Number of children: 5   Years of education: in [place] program   Highest education level: Bachelor's degree (e.g., BA, AB, BS)  Occupational History   Occupation: TEACHER    Employer: [employer]    Comment: ESL  Tobacco Use   Smoking status: Never   Smokeless tobacco: Never  Vaping Use   Vaping Use: Never used  Substance and Sexual Activity   Alcohol use: No    Alcohol/week: 0.0 standard drinks of alcohol   Drug use: No   Sexual activity: Not Currently    Partners: Male    Birth control/protection: None    Comment: 1st intercourse 28yo-1 partner  Other Topics Concern   Not on file  Social History Narrative   Lives with her husband and their 5 adpoted children.    Adopted 2 sets of siblings, all with psychiatric diagnoses (ADHD, Asperger's, ODD, PTSD, depression, bipolar disorder).   Her husband has Asperger's, and works in disability services.   Occasionally drinks coke or pepsi    Social Determinants of Health   Financial Resource Strain: Low Risk  (07/21/2022)   Overall Financial Resource Strain (CARDIA)    Difficulty of Paying Living Expenses: Not hard at all  Food Insecurity: No Food Insecurity (07/21/2022)   Hunger Vital Sign    Worried About Running Out of Food in the Last Year: Never true    Ran Out of Food in the Last Year: Never true  Transportation Needs: No Transportation Needs (07/21/2022)   PRAPARE - Administrator, Civil Service (Medical): No    Lack of Transportation (Non-Medical): No  Physical Activity: Insufficiently Active (07/21/2022)   Exercise Vital Sign    Days of Exercise per Week: 1 day    Minutes of Exercise per Session: 20 min  Stress: Stress Concern Present (07/21/2022)   [employer] of Occupational Health - Occupational Stress  Questionnaire    Feeling of Stress : To some extent  Social Connections: Socially Integrated (07/21/2022)   Social Connection and Isolation Panel [NHANES]    Frequency of Communication with Friends and Family: Three times a week    Frequency of Social Gatherings with Friends and Family: Once a week    Attends Religious Services: More than 4 times per year    Active Member of [employer] or Organizations: Yes    Attends Engineer, structural: More than 4 times per year    Marital Status: Married  Catering manager Violence: Not At Risk (07/21/2022)   Humiliation, Afraid, Rape, and Kick questionnaire    Fear of Current or Ex-Partner: No    Emotionally Abused: No    Physically Abused: No    Sexually Abused: No    ROS See HPI above    Objective   BP (!) 162/108 (BP Location: Left Arm, Cuff Size: Large)   Pulse 64   Temp 98.7 F (37.1 C)   Ht 5\' 8"  (1.727 m)   Wt 291 lb 8 oz (132.2 kg)   SpO2 97%   BMI 44.32 kg/m   Physical Exam Vitals reviewed.  Constitutional:      General: She is not in acute distress.    Appearance: Normal appearance. She is obese. She is not ill-appearing, toxic-appearing or diaphoretic.  Eyes:     General:        Right eye: No discharge.        Left eye: No discharge.     Conjunctiva/sclera: Conjunctivae normal.  Cardiovascular:     Rate and Rhythm: Normal rate and regular rhythm.     Heart sounds: Normal heart sounds. No murmur heard.    No friction rub. No gallop.  Pulmonary:     Effort: Pulmonary effort is normal. No respiratory distress.     Breath sounds: Normal breath sounds.  Musculoskeletal:        General: Normal range of motion.  Skin:    General: Skin is warm and dry.  Neurological:     General: No focal deficit present.     Mental Status: She is alert and oriented to person, place, and time. Mental status is at baseline.  Psychiatric:        Mood and Affect: Mood normal.        Behavior: Behavior normal.        Thought Content:  Thought content normal.        Judgment: Judgment normal.      Assessment & Plan:  Hypothyroidism, unspecified type Assessment & Plan: Stable. Continue with Levothyroxine daily. Ordered TSH for a future lab when she has her lab visit when fasting for other labs.   Orders: -     TSH; Future  Other hyperlipidemia Assessment & Plan: Managed by cardiologist, but not had an appointment in a while. Ordered lipid panel since has not been done since 12/2021 with significant elevated results. Patient will come back for a lab visit when fasting.   Orders: -     Comprehensive metabolic panel; Future -     Lipid panel; Future  Depression with anxiety Assessment & Plan: Stable. Continue with Bupropion 150mg  BID, Buspirone 15mg  BID, Citalopram 60mg  daily (two different strengths-40mg  and 20mg ) and Lorazepam 1mg  BID as needed. Denies any SI or HI. She is aware before Lorazepam can be refilled from this provider, she will need a controlled substance agreement signed and UDS completed. She reports she usually takes about once a month.    Perennial allergic rhinitis Assessment & Plan: Stable. Continue with Cetirizine 10mg  daily, Fluticasone nasal spray, and Montelukast 10mg  daily.     Encounter for screening mammogram for malignant neoplasm of breast -     MM 3D DIAGNOSTIC MAMMOGRAM BILATERAL BREAST  Skin cancer screening -     Ambulatory referral to Dermatology  Colon cancer screening -     Ambulatory referral to Gastroenterology  Elevated blood pressure reading in office without diagnosis of hypertension  Encounter to establish care  1.Review health maintenance:  -Ordered mammogram scan for breast cancer screening.  -Placed a referral to GI for colonoscopy for colon cancer screening. -Placed a referral to dermatology for full body skin cancer screening.  2. Blood pressure is elevated today. Recommend patient monitor her blood pressure with an upper arm cuff machine BID for 2  weeks. Follow up in 2 weeks to assess blood pressure. If still elevated in the next weeks, will need to start medication management.    Return in about 2 weeks (around 08/04/2022) for  follow up needs lab visit for fasting, .   Zandra Abts, NP

## 2022-07-21 NOTE — Assessment & Plan Note (Signed)
Stable. Continue with Levothyroxine daily. Ordered TSH for a future lab when she has her lab visit when fasting for other labs.

## 2022-07-26 ENCOUNTER — Other Ambulatory Visit (INDEPENDENT_AMBULATORY_CARE_PROVIDER_SITE_OTHER): Payer: BC Managed Care – PPO

## 2022-07-26 ENCOUNTER — Other Ambulatory Visit: Payer: Self-pay

## 2022-07-26 DIAGNOSIS — E7849 Other hyperlipidemia: Secondary | ICD-10-CM | POA: Diagnosis not present

## 2022-07-26 DIAGNOSIS — E039 Hypothyroidism, unspecified: Secondary | ICD-10-CM | POA: Diagnosis not present

## 2022-07-26 DIAGNOSIS — N1831 Chronic kidney disease, stage 3a: Secondary | ICD-10-CM

## 2022-07-26 LAB — LIPID PANEL
Cholesterol: 290 mg/dL — ABNORMAL HIGH (ref 0–200)
HDL: 66.4 mg/dL (ref >39.00)
LDL Cholesterol: 197 mg/dL — ABNORMAL HIGH (ref 0–99)
NonHDL: 223.76
Total CHOL/HDL Ratio: 4
Triglycerides: 133 mg/dL (ref 0.0–149.0)
VLDL: 26.6 mg/dL (ref 0.0–40.0)

## 2022-07-26 LAB — COMPREHENSIVE METABOLIC PANEL
ALT: 36 U/L — ABNORMAL HIGH (ref 0–35)
AST: 38 U/L — ABNORMAL HIGH (ref 0–37)
Albumin: 3.4 g/dL — ABNORMAL LOW (ref 3.5–5.2)
Alkaline Phosphatase: 99 U/L (ref 39–117)
BUN: 16 mg/dL (ref 6–23)
CO2: 26 mEq/L (ref 19–32)
Calcium: 10 mg/dL (ref 8.4–10.5)
Chloride: 108 mEq/L (ref 96–112)
Creatinine, Ser: 1.24 mg/dL — ABNORMAL HIGH (ref 0.40–1.20)
GFR: 51.51 mL/min — ABNORMAL LOW (ref >60.00)
Glucose, Bld: 103 mg/dL — ABNORMAL HIGH (ref 70–99)
Potassium: 4.2 mEq/L (ref 3.5–5.1)
Sodium: 140 mEq/L (ref 135–145)
Total Bilirubin: 0.7 mg/dL (ref 0.2–1.2)
Total Protein: 6 g/dL (ref 6.0–8.3)

## 2022-07-26 LAB — TSH: TSH: 4.2 u[IU]/mL (ref 0.35–5.50)

## 2022-07-26 NOTE — Progress Notes (Signed)
Pt agrees to referral.  Pt has no concerns about labs at this time

## 2022-07-26 NOTE — Telephone Encounter (Signed)
-----   Message from Alveria Apley, NP sent at 07/26/2022  1:39 PM EDT ----- Your bad (LDL) cholesterol and total cholesterol are elevated, diet encouraged - increase intake of fresh fruits and vegetables, increase intake of lean proteins. Bake, broil, or grill foods. Avoid fried, greasy, and fatty foods. Avoid fast foods. Increase intake of fiber-rich whole grains. Exercise encouraged - at least 150 minutes per week and advance as tolerated. Your levels are not enough to need medication at this time.  Kidney function is about the same, but is decreased for your age. Recommend a nephrology referral (kidney specialist). Recommend to drink plenty fluids, preferably water, 64-100oz a day.

## 2022-07-28 ENCOUNTER — Other Ambulatory Visit (HOSPITAL_COMMUNITY): Payer: Self-pay

## 2022-07-28 ENCOUNTER — Telehealth: Payer: Self-pay

## 2022-07-28 NOTE — Telephone Encounter (Signed)
Pharmacy Patient Advocate Encounter   Received notification from [facility] that prior authorization for Ubrelvy 100MG  Tablets is required/requested.   PA submitted on 07/28/2022 to (ins) [employer] via [employer] or [facility]) confirmation # [id number] Status is pending

## 2022-07-31 MED ORDER — RIZATRIPTAN BENZOATE 10 MG PO TBDP: 10.0000 mg | ORAL_TABLET | ORAL | 2 refills | Status: DC | PRN

## 2022-07-31 NOTE — Telephone Encounter (Signed)
Pharmacy Patient Advocate Encounter  Received notification from [employer] that the request for prior authorization for Ubrelvy 100MG  Tablet has been denied due to see below.      Please be advised we currently do not have a Pharmacist to review denials, therefore you will need to process appeals accordingly as needed. Thanks for your support at this time.   You may call [phone] or fax [phone], to appeal.

## 2022-07-31 NOTE — Telephone Encounter (Signed)
Patient has only tried Sumatriptan. Dr Frances Furbish, what other triptan do you suggest patient try?

## 2022-07-31 NOTE — Telephone Encounter (Signed)
Please advise pt: We can try Maxalt orally disintegrating tab, 10 mg: take 1 pill early on when you suspect a migraine attack come on. May take another pill within 2 hours, no more than 2 pills in 24 hours. No more than 3 pills a week.   Most people who take triptans do not have any serious side-effects. However, they can cause drowsiness (remember to not drive or use heavy machinery when drowsy), nausea, dizziness, dry mouth. Less common side effects include strange sensations, such as tightness in your chest or throat, tingling, flushing, and feelings of heaviness or pressure in areas such as the face, limbs, and chest. These in the chest can mimic heart related pain (angina) and may cause alarm, but usually these sensations are not harmful or a sign of a heart attack. However, if you develop intense chest pain or sensations of discomfort, you should stop taking your medication and consult with me or your PCP or go to the nearest urgent care facility or ER or call 911.   Rx sent to pharmacy on file.

## 2022-08-01 NOTE — Telephone Encounter (Signed)
Called pt & LVM (ok per DPR) with all details from Dr Frances Furbish below regarding new prescription for Rizatriptan for patient to try since Bernita Raisin was denied by insurance. I left our office number for call back and advised patient that Rx was already sent to her pharmacy.

## 2022-08-02 NOTE — Progress Notes (Signed)
Referral to nephrology for GFR 51.51 and Creatinine 1.24 with continued decreased kidney function with labs over a year.

## 2022-08-02 NOTE — Telephone Encounter (Signed)
Called pt a second time & LVM (Ok per [place]) asking for a call back when she can to confirm receipt of message regarding Rizatriptan to try in place of Bernita Raisin which was denied. Left office number for call back.

## 2022-08-03 NOTE — Telephone Encounter (Signed)
Patient hasn't responded yet to our mychart message or phone calls regarding trial of Rizatriptan. Dr Frances Furbish is aware. We will wait to hear back from patient.

## 2022-08-04 ENCOUNTER — Ambulatory Visit: Payer: BC Managed Care – PPO | Admitting: Family Medicine

## 2022-08-04 VITALS — BP 148/82 | HR 76 | Ht 68.0 in | Wt 294.4 lb

## 2022-08-04 DIAGNOSIS — I10 Essential (primary) hypertension: Secondary | ICD-10-CM | POA: Diagnosis not present

## 2022-08-04 DIAGNOSIS — Z09 Encounter for follow-up examination after completed treatment for conditions other than malignant neoplasm: Secondary | ICD-10-CM

## 2022-08-04 DIAGNOSIS — R609 Edema, unspecified: Secondary | ICD-10-CM | POA: Diagnosis not present

## 2022-08-04 DIAGNOSIS — F3289 Other specified depressive episodes: Secondary | ICD-10-CM

## 2022-08-04 MED ORDER — HYDROCHLOROTHIAZIDE 12.5 MG PO CAPS
12.5000 mg | ORAL_CAPSULE | Freq: Every day | ORAL | 0 refills | Status: DC
Start: 2022-08-04 — End: 2022-08-08

## 2022-08-04 MED ORDER — BUSPIRONE HCL 15 MG PO TABS
15.0000 mg | ORAL_TABLET | Freq: Two times a day (BID) | ORAL | 1 refills | Status: DC
Start: 2022-08-04 — End: 2023-01-24

## 2022-08-04 MED ORDER — LISINOPRIL 10 MG PO TABS
10.0000 mg | ORAL_TABLET | Freq: Every day | ORAL | 0 refills | Status: DC
Start: 2022-08-04 — End: 2022-09-01

## 2022-08-04 NOTE — Progress Notes (Signed)
Established Patient Office Visit   Subjective:  Patient ID: Melanie [name], female    DOB: 1975/11/06  Age: 47 y.o. MRN: [id number]  Chief Complaint  Patient presents with   Follow-up    Pt is here today to F/U Pt has B/P reading with her today. Pt reports she has RT chest pain-Dull pain 2 days ago. Denies -SOB,Headaches     HPI On previous visit, patients blood pressure was elevated, 162/108. Recommend patient to monitor her blood pressure BID. Patient has her blood pressure readings with her. Blood pressures ranging 142-176/74-97. She reports she had right sided chest pain 2 days ago while walking around with no other associated symptoms. Denies any chest pain since. Denies headache, dizziness, and shortness of breath. Reports she does have lower extremity edema. She is concerned with going on a trip to [place] to see her mother who is ill.   ROS See HPI above    Objective:   BP (!) 148/82   Pulse 76   Ht 5\' 8"  (1.727 m)   Wt 294 lb 6 oz (133.5 kg)   SpO2 98%   BMI 44.76 kg/m    Physical Exam Vitals reviewed.  Constitutional:      General: She is not in acute distress.    Appearance: Normal appearance. She is obese. She is not ill-appearing, toxic-appearing or diaphoretic.  HENT:     Head: Normocephalic and atraumatic.  Eyes:     General:        Right eye: No discharge.        Left eye: No discharge.     Conjunctiva/sclera: Conjunctivae normal.  Cardiovascular:     Rate and Rhythm: Normal rate and regular rhythm.     Heart sounds: Normal heart sounds. No murmur heard.    No friction rub. No gallop.  Pulmonary:     Effort: Pulmonary effort is normal. No respiratory distress.     Breath sounds: Normal breath sounds.  Musculoskeletal:        General: Normal range of motion.     Right lower leg: Edema (Mild trace pitting edema) present.     Left lower leg: Edema (+1 pitting edema.) present.  Skin:    General: Skin is warm and dry.  Neurological:     General: No  focal deficit present.     Mental Status: She is alert and oriented to person, place, and time. Mental status is at baseline.  Psychiatric:        Mood and Affect: Mood normal.        Behavior: Behavior normal.        Thought Content: Thought content normal.        Judgment: Judgment normal.      Assessment & Plan:  Hypertension, unspecified type Assessment & Plan: Blood pressure is not controlled. Prescribed Lisinopril 10mg  tablet, 1 tablet at bedtime. Continue to monitor her blood pressure twice a day and bring in her readings in 2 weeks. Discussed about Lisinopril and how it protects her kidneys. Also, reviewed over labs with decrease in kidney function. Will need to draw a BMP in 1 month.   Orders: -     Lisinopril; Take 1 tablet (10 mg total) by mouth daily.  Dispense: 90 tablet; Refill: 0  Other depression -     busPIRone HCl; Take 1 tablet (15 mg total) by mouth 2 (two) times daily.  Dispense: 180 tablet; Refill: 1  Edema, unspecified type -  hydroCHLOROthiazide; Take 1 capsule (12.5 mg total) by mouth daily.  Dispense: 7 capsule; Refill: 0  Follow-up exam  -Please contact the following for nephrology from previous referral [facility] [address], [place], [place] [address] [phone]  -Prescribed Hydrochlorothiazide 12.5mg  tablet, 1 tablet once a day as needed for lower extremity edema while traveling to [place]. Discussed about concerns of taking two medications for treating blood pressure and the impact it can have on her kidneys. Also, she is going to wear compression stockings while traveling. Advised to stop frequently to move around and elevate legs when possible.  -Refilled Buspirone.  -Advised if she had chest pain again, she needs further workup. She denies any chest pain in office or today.   Return in about 2 weeks (around 08/18/2022) for follow-up.   Zandra Abts, NP

## 2022-08-04 NOTE — Patient Instructions (Addendum)
-  Please contact the following for a nephrology referral [facility] [address] [place], [place] [address] [phone]  -Blood pressure is not controlled. START Lisinopril 10mg  tablet, 1 tablet at bedtime. Continue to monitor your blood pressure twice a day and bring in your readings in 2 weeks.  -Prescribed Hydrochlorothiazide 12.5mg  tablet, 1 tablet once a day as needed for lower extremity edema while traveling to [place].  -Follow up in 2 weeks.  -Sending positive thoughts about your mom.

## 2022-08-04 NOTE — Assessment & Plan Note (Signed)
Blood pressure is not controlled. Prescribed Lisinopril 10mg  tablet, 1 tablet at bedtime. Continue to monitor her blood pressure twice a day and bring in her readings in 2 weeks. Discussed about Lisinopril and how it protects her kidneys. Also, reviewed over labs with decrease in kidney function. Will need to draw a BMP in 1 month.

## 2022-08-08 ENCOUNTER — Other Ambulatory Visit: Payer: Self-pay

## 2022-08-08 ENCOUNTER — Other Ambulatory Visit: Payer: Self-pay | Admitting: Family Medicine

## 2022-08-08 DIAGNOSIS — E038 Other specified hypothyroidism: Secondary | ICD-10-CM

## 2022-08-08 DIAGNOSIS — R609 Edema, unspecified: Secondary | ICD-10-CM

## 2022-08-08 MED ORDER — LEVOTHYROXINE SODIUM 88 MCG PO TABS
ORAL_TABLET | ORAL | 1 refills | Status: DC
Start: 2022-08-08 — End: 2023-02-14

## 2022-08-11 ENCOUNTER — Other Ambulatory Visit: Payer: Self-pay

## 2022-08-11 ENCOUNTER — Telehealth: Payer: Self-pay

## 2022-08-11 DIAGNOSIS — F419 Anxiety disorder, unspecified: Secondary | ICD-10-CM

## 2022-08-11 MED ORDER — CITALOPRAM HYDROBROMIDE 40 MG PO TABS
40.0000 mg | ORAL_TABLET | Freq: Every day | ORAL | 1 refills | Status: DC
Start: 2022-08-11 — End: 2023-02-14

## 2022-08-11 NOTE — Telephone Encounter (Signed)
Pharmacy is sending a refill for pt Topirmate 100 mg  She has just est care with you and another provider Dr Sidonie Dickens? Gave the original Rx . Pt is asking if you can refill medication ?

## 2022-08-11 NOTE — Telephone Encounter (Signed)
I spoke to the and she states she is not sure why this was sent here and not to her Neurologist .

## 2022-08-13 ENCOUNTER — Other Ambulatory Visit: Payer: Self-pay | Admitting: Family Medicine

## 2022-08-13 DIAGNOSIS — R609 Edema, unspecified: Secondary | ICD-10-CM

## 2022-08-18 ENCOUNTER — Telehealth: Payer: Self-pay

## 2022-08-18 NOTE — Telephone Encounter (Signed)
error 

## 2022-08-31 ENCOUNTER — Other Ambulatory Visit: Payer: Self-pay | Admitting: Family Medicine

## 2022-08-31 ENCOUNTER — Other Ambulatory Visit: Payer: Self-pay

## 2022-08-31 DIAGNOSIS — T7840XS Allergy, unspecified, sequela: Secondary | ICD-10-CM

## 2022-08-31 MED ORDER — MONTELUKAST SODIUM 10 MG PO TABS
10.0000 mg | ORAL_TABLET | Freq: Every day | ORAL | 0 refills | Status: AC
Start: 2022-08-31 — End: ?

## 2022-09-01 ENCOUNTER — Ambulatory Visit: Payer: BC Managed Care – PPO | Admitting: Family Medicine

## 2022-09-01 ENCOUNTER — Encounter: Payer: Self-pay | Admitting: Family Medicine

## 2022-09-01 VITALS — BP 128/74 | HR 63 | Temp 98.4°F | Ht 68.0 in | Wt 292.5 lb

## 2022-09-01 DIAGNOSIS — I10 Essential (primary) hypertension: Secondary | ICD-10-CM | POA: Diagnosis not present

## 2022-09-01 MED ORDER — LISINOPRIL 20 MG PO TABS
20.0000 mg | ORAL_TABLET | Freq: Every day | ORAL | 0 refills | Status: DC
Start: 2022-09-01 — End: 2022-12-05

## 2022-09-01 NOTE — Patient Instructions (Signed)
-  INCREASE Lisinopril 20mg  daily. You can take 2 tablets of the 10mg  to make up 20mg  dose until you have finsihed taking the 10mg  tablets.  -Continue to monitor blood pressure twice daily. Doing a great job with that. -Ordered BMP for kidney function and potassium level.  -Follow up in 2 weeks.

## 2022-09-01 NOTE — Assessment & Plan Note (Signed)
Blood pressure is not completely controlled. Increase Lisinopril to 20mg  from 10mg . Advised that she can take 2 tablets of 10mg  to equal 20mg  dose. Continue to monitor blood pressure BID. Ordered BMP to assess kidney function and potassium.

## 2022-09-01 NOTE — Progress Notes (Signed)
Established Patient Office Visit   Subjective:  Patient ID: Melanie [name], female    DOB: 09-Dec-1975  Age: 47 y.o. MRN: [id number]  Chief Complaint  Patient presents with   Follow-up    Pt is here today to F/U on B/P reading. Pt has reading with her today. Pt has no concerns with meds.    HPI Patient is taking Lisinopril 10mg  daily. Readings are ranging 117-156/70-98. Blood pressure is not completing controlled. While gone to [place], she did take HCTZ 12.5mg  while driving for lower extremity edema. Denies any concerns with medication.   ROS See HPI above    Objective:   BP 128/74   Pulse 63   Temp 98.4 F (36.9 C)   Ht 5\' 8"  (1.727 m)   Wt 292 lb 8 oz (132.7 kg)   SpO2 98%   BMI 44.47 kg/m    Physical Exam Vitals reviewed.  Constitutional:      General: She is not in acute distress.    Appearance: Normal appearance. She is obese. She is not ill-appearing, toxic-appearing or diaphoretic.  Eyes:     General:        Right eye: No discharge.        Left eye: No discharge.     Conjunctiva/sclera: Conjunctivae normal.  Cardiovascular:     Rate and Rhythm: Normal rate and regular rhythm.     Heart sounds: Normal heart sounds. No murmur heard.    No friction rub. No gallop.     Bilateral lower extremity edema-wearing compression stockings  Pulmonary:     Effort: Pulmonary effort is normal. No respiratory distress.     Breath sounds: Normal breath sounds.  Musculoskeletal:        General: Normal range of motion.  Skin:    General: Skin is warm and dry.  Neurological:     General: No focal deficit present.     Mental Status: She is alert and oriented to person, place, and time. Mental status is at baseline.  Psychiatric:        Mood and Affect: Mood normal.        Behavior: Behavior normal.        Thought Content: Thought content normal.        Judgment: Judgment normal.      Assessment & Plan:  Hypertension, unspecified type Assessment & Plan: Blood pressure  is not completely controlled. Increase Lisinopril to 20mg  from 10mg . Advised that she can take 2 tablets of 10mg  to equal 20mg  dose. Continue to monitor blood pressure BID. Ordered BMP to assess kidney function and potassium.   Orders: -     Lisinopril; Take 1 tablet (20 mg total) by mouth daily.  Dispense: 90 tablet; Refill: 0 -     Basic metabolic panel   Return in about 2 weeks (around 09/15/2022) for follow-up.   Zandra Abts, NP

## 2022-09-02 LAB — SPECIMEN COMPROMISED

## 2022-09-02 LAB — BASIC METABOLIC PANEL
BUN/Creatinine Ratio: 12 (calc) (ref 6–22)
BUN: 17 mg/dL (ref 7–25)
CO2: 25 mmol/L (ref 20–32)
Calcium: 10.2 mg/dL (ref 8.6–10.2)
Chloride: 108 mmol/L (ref 98–110)
Creat: 1.4 mg/dL — ABNORMAL HIGH (ref 0.50–0.99)
Glucose, Bld: 132 mg/dL — ABNORMAL HIGH (ref 65–99)
Potassium: 3.9 mmol/L (ref 3.5–5.3)
Sodium: 138 mmol/L (ref 135–146)

## 2022-09-04 ENCOUNTER — Telehealth: Payer: Self-pay

## 2022-09-04 NOTE — Telephone Encounter (Signed)
-----   Message from Alveria Apley, NP sent at 09/04/2022  8:12 AM EDT ----- Kidney function is a little worse. Recommend to hydrate. Will check kidney function at next appointment.

## 2022-09-15 ENCOUNTER — Ambulatory Visit: Payer: BC Managed Care – PPO | Admitting: Family Medicine

## 2022-09-15 VITALS — BP 138/76 | HR 68 | Temp 98.7°F | Ht 68.0 in | Wt 294.4 lb

## 2022-09-15 DIAGNOSIS — Z09 Encounter for follow-up examination after completed treatment for conditions other than malignant neoplasm: Secondary | ICD-10-CM

## 2022-09-15 DIAGNOSIS — F418 Other specified anxiety disorders: Secondary | ICD-10-CM

## 2022-09-15 DIAGNOSIS — I10 Essential (primary) hypertension: Secondary | ICD-10-CM

## 2022-09-15 MED ORDER — CITALOPRAM HYDROBROMIDE 20 MG PO TABS
20.0000 mg | ORAL_TABLET | Freq: Every day | ORAL | 5 refills | Status: DC
Start: 2022-09-15 — End: 2023-07-11

## 2022-09-15 NOTE — Assessment & Plan Note (Signed)
Blood pressure is improved overall with taking Lisinopril 20mg  daily. Continue taking Lisinopril 20mg . Ordered BMP to assess kidney function. She is awaiting to go to a nephrologist on 08/26.

## 2022-09-15 NOTE — Assessment & Plan Note (Signed)
Stable. Refilled Citalopram 20mg  daily that she takes with Citalopram 40mg  to equal 60mg  daily.

## 2022-09-15 NOTE — Progress Notes (Signed)
Established Patient Office Visit   Subjective:  Patient ID: Melanie [name], female    DOB: 04-Dec-1975  Age: 47 y.o. MRN: [id number]  Chief Complaint  Patient presents with   Hypertension    2 week f/u no concerns, notes no side effects,     Hypertension   On previous appointment, increased Lisinopril to 20mg  daily since her readings were still elevated above 140/90 at home. Patient has brought in readings of blood pressure, ranging 114-158/65-93. There are 15 blood pressures with only 5 blood pressures being elevated above 140/90.  BP Readings from Last 3 Encounters:  09/15/22 138/76  09/01/22 128/74  08/04/22 (!) 148/82     Kidney function was a little worse than previous lab. Asked to promote hydration and will recheck kidney function today.   Requesting a refill of Citalopram 20mg  daily to take with Citalopram 40mg  to equal 60mg  daily. Effective.  ROS See HPI above     Objective:   BP 138/76   Pulse 68   Temp 98.7 F (37.1 C) (Temporal)   Ht 5\' 8"  (1.727 m)   Wt 294 lb 6.4 oz (133.5 kg)   SpO2 97%   BMI 44.76 kg/m    Physical Exam Vitals reviewed.  Constitutional:      General: She is not in acute distress.    Appearance: Normal appearance. She is obese. She is not ill-appearing, toxic-appearing or diaphoretic.  HENT:     Head: Normocephalic and atraumatic.  Eyes:     General:        Right eye: No discharge.        Left eye: No discharge.     Conjunctiva/sclera: Conjunctivae normal.  Cardiovascular:     Rate and Rhythm: Normal rate and regular rhythm.     Heart sounds: Normal heart sounds. No murmur heard.    No friction rub. No gallop.  Pulmonary:     Effort: Pulmonary effort is normal. No respiratory distress.     Breath sounds: Normal breath sounds.  Musculoskeletal:        General: Normal range of motion.  Skin:    General: Skin is warm and dry.  Neurological:     General: No focal deficit present.     Mental Status: She is alert and oriented  to person, place, and time. Mental status is at baseline.  Psychiatric:        Mood and Affect: Mood normal.        Behavior: Behavior normal.        Thought Content: Thought content normal.        Judgment: Judgment normal.      Assessment & Plan:  Hypertension, unspecified type Assessment & Plan: Blood pressure is improved overall with taking Lisinopril 20mg  daily. Continue taking Lisinopril 20mg . Ordered BMP to assess kidney function. She is awaiting to go to a nephrologist on 08/26.   Orders: -     Basic metabolic panel  Depression with anxiety Assessment & Plan: Stable. Refilled Citalopram 20mg  daily that she takes with Citalopram 40mg  to equal 60mg  daily.    Orders: -     Citalopram Hydrobromide; Take 1 tablet (20 mg total) by mouth daily. In addition to the 40mg  dose for a total of 60mg   Dispense: 30 tablet; Refill: 5  Follow-up exam   Return in about 6 months (around 03/18/2023) for chronic management.   Zandra Abts, NP

## 2022-09-15 NOTE — Patient Instructions (Addendum)
-  Blood pressure is stable with taking Lisinopril 20mg  daily. Continue to taking Lisinopril. -Refilled Citalopram for depression/anxiety.  -Follow up in 6 months.

## 2022-10-12 ENCOUNTER — Other Ambulatory Visit: Payer: Self-pay

## 2022-10-12 DIAGNOSIS — F3289 Other specified depressive episodes: Secondary | ICD-10-CM

## 2022-10-12 MED ORDER — BUPROPION HCL ER (SR) 150 MG PO TB12
150.0000 mg | ORAL_TABLET | Freq: Two times a day (BID) | ORAL | 1 refills | Status: DC
Start: 2022-10-12 — End: 2023-05-04

## 2022-10-17 ENCOUNTER — Encounter: Payer: Self-pay | Admitting: Gastroenterology

## 2022-11-02 ENCOUNTER — Encounter: Payer: Self-pay | Admitting: *Deleted

## 2022-11-02 NOTE — Progress Notes (Deleted)
PATIENT: Melanie [name] DOB: 25-Dec-1975  REASON FOR VISIT: follow up HISTORY FROM: patient PRIMARY NEUROLOGIST:  Virtual Visit via Video Note  I connected with Melanie [name] on 11/06/22 at 11:30 AM EDT by a video enabled telemedicine application located remotely at [facility] and verified that I am speaking with the correct person using two identifiers who was located at their own home.   I discussed the limitations of evaluation and management by telemedicine and the availability of in person appointments. The patient expressed understanding and agreed to proceed. HISTORY OF PRESENT ILLNESS: Today 11/05/22:  Melanie [name] is a 47 y.o. female with a history of OSA on CPAP and Migraine headaches. Returns today for follow-up. Lat visit Topamax was increased to 50 mg in the AM and 100 mg in the PM. Ubrelvy was increased to 100 mg.       HISTORY   REVIEW OF SYSTEMS: Out of a complete 14 system review of symptoms, the patient complains only of the following symptoms, and all other reviewed systems are negative.  FSS ESS  ALLERGIES: Allergies  Allergen Reactions   Welchol [Colesevelam] Shortness Of Breath   Lidocaine Other (See Comments)    Flu like sx's   Pistachio Nut (Diagnostic) Itching and Swelling    Swollen tongue/itchy mouth   Relafen [Nabumetone] Other (See Comments)    migraine   Celebrex [Celecoxib] Rash   Sulfa Antibiotics Rash    HOME MEDICATIONS: Outpatient Medications Prior to Visit  Medication Sig Dispense Refill   albuterol (VENTOLIN HFA) 108 (90 Base) MCG/ACT inhaler Inhale 2 puffs into the lungs every 6 (six) hours as needed for wheezing or shortness of breath. 8 g 0   buPROPion (WELLBUTRIN SR) 150 MG 12 hr tablet Take 1 tablet (150 mg total) by mouth 2 (two) times daily. 180 tablet 1   busPIRone (BUSPAR) 15 MG tablet Take 1 tablet (15 mg total) by mouth 2 (two) times daily. 180 tablet 1   Calcium Carbonate (CALCIUM 500 PO) Take 1  tablet by mouth daily.     cetirizine (ZYRTEC) 10 MG tablet Take 10 mg by mouth at bedtime.      citalopram (CELEXA) 20 MG tablet Take 1 tablet (20 mg total) by mouth daily. In addition to the 40mg  dose for a total of 60mg  30 tablet 5   citalopram (CELEXA) 40 MG tablet Take 1 tablet (40 mg total) by mouth daily. 90 tablet 1   Evolocumab (REPATHA SURECLICK) 140 MG/ML SOAJ Inject 1 pen every 14 days, must schedule OV for further refills. 6 mL 0   Fish Oil-Cholecalciferol (OMEGA-3 + VITAMIN D3 PO) Take 1 tablet by mouth daily. 4,000IU of vitamin D3 115mg  of Omega-3     fluticasone (FLONASE) 50 MCG/ACT nasal spray Place 2 sprays into both nostrils daily. 16 g 6   levothyroxine (SYNTHROID) 88 MCG tablet TAKE ONE TABLET BY MOUTH DAILY BEFORE BREAKFAST 90 tablet 1   lisinopril (ZESTRIL) 20 MG tablet Take 1 tablet (20 mg total) by mouth daily. 90 tablet 0   LORazepam (ATIVAN) 1 MG tablet TAKE ONE TABLET BY MOUTH TWICE A DAY AS NEEDED FOR ANXIETY 20 tablet 0   montelukast (SINGULAIR) 10 MG tablet Take 1 tablet (10 mg total) by mouth at bedtime. 90 tablet 0   Multiple Vitamin (MULTIVITAMIN WITH MINERALS) TABS tablet Take 1 tablet by mouth daily.     Polyethyl Glycol-Propyl Glycol (LUBRICANT EYE DROPS) 0.4-0.3 % SOLN Place 1 drop into both  eyes 3 (three) times daily as needed (dry/irritated eyes).     rizatriptan (MAXALT-MLT) 10 MG disintegrating tablet Take 1 tablet (10 mg total) by mouth as needed for migraine. May repeat in 2 hours if needed 9 tablet 2   topiramate (TOPAMAX) 100 MG tablet Half a pill in the morning and 1 pill at bedtime daily. 45 tablet 3   Ubrogepant (UBRELVY) 100 MG TABS Take 1 tablet (100 mg total) by mouth as needed. May repeat in 2 hours if needed, no more than 2 pills in 24 hours. 10 tablet 3   No facility-administered medications prior to visit.    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    Migraines    LIGHT SENSITIVITY , NOW ON IMITREX SINCE 08-02-2018,NOTICES IMPROVEMENT    Multiple food allergies    Muscle pain    Numbness and tingling in both hands    Pre-diabetes    Shortness of breath    was due to a medication side effect, now off medication , SOB resolved    Sleep apnea    CPAP   Stress    Swelling of both lower extremities     PAST SURGICAL HISTORY: Past Surgical History:  Procedure Laterality Date   ELBOW LIGAMENT RECONSTRUCTION Right 08/21/2018   Procedure: LATERAL COLLATERAL LIGAMENT REPAIR;  Surgeon: Bjorn Pippin, MD;  Location: [facility];  Service: Orthopedics;  Laterality: Right;   FINGER SURGERY Left     index finger   KNEE SURGERY Left    LAPAROSCOPIC GASTRIC SLEEVE RESECTION N/A 05/31/2020   Procedure: LAPAROSCOPIC GASTRIC SLEEVE RESECTION;  Surgeon: Sheliah Hatch, De Blanch, MD;  Location: [facility];  Service: General;  Laterality: N/A;  2 HOURS TOTAL ROOM 1   LIVER BIOPSY N/A 05/15/2014   Procedure: LIVER BIOPSY;  Surgeon: Louis Meckel, MD;  Location: [facility];  Service: Endoscopy;  Laterality: N/A;  ultrasound to mark the liver   TENNIS ELBOW RELEASE/NIRSCHEL PROCEDURE Right 08/21/2018   Procedure: TENNIS ELBOW RELEASE/NIRSCHEL PROCEDURE;  Surgeon: Bjorn Pippin, MD;  Location: [facility];  Service: Orthopedics;  Laterality: Right;   TONSILLECTOMY AND ADENOIDECTOMY  1978   TYMPANOSTOMY TUBE PLACEMENT     ULNAR NERVE TRANSPOSITION Right 08/21/2018   Procedure: ULNAR NERVE DECOMPRESSION/TRANSPOSITION;  Surgeon: Bjorn Pippin, MD;  Location: [facility];  Service: Orthopedics;  Laterality: Right;   UPPER GI ENDOSCOPY N/A 05/31/2020   Procedure: UPPER GI ENDOSCOPY;  Surgeon: Sheliah Hatch, De Blanch, MD;   Location: [facility];  Service: General;  Laterality: N/A;   wrist cyst Left     FAMILY HISTORY: Family History  Problem Relation Age of Onset   Diabetes Mother    Liver disease Mother        ESLD- unknown etiology   Obesity Mother    Kidney disease Mother    Colon cancer Mother    Hyperlipidemia Father    Melanoma Father    Migraines Sister    Bipolar disorder Brother    COPD Paternal Aunt  COPD Paternal Aunt    Diabetes Maternal Grandmother    Stroke Maternal Grandfather    Diabetes Paternal Grandmother    Breast cancer Paternal Grandmother     SOCIAL HISTORY: Social History   Socioeconomic History   Marital status: Married    Spouse name: [name]   Number of children: 5   Years of education: in [place] program   Highest education level: Bachelor's degree (e.g., BA, AB, BS)  Occupational History   Occupation: Magazine features editor: [employer]    Comment: ESL  Tobacco Use   Smoking status: Never   Smokeless tobacco: Never  Vaping Use   Vaping status: Never Used  Substance and Sexual Activity   Alcohol use: No    Alcohol/week: 0.0 standard drinks of alcohol   Drug use: No   Sexual activity: Not Currently    Partners: Male    Birth control/protection: None    Comment: 1st intercourse 28yo-1 partner  Other Topics Concern   Not on file  Social History Narrative   Lives with her husband and their 5 adpoted children.    Adopted 2 sets of siblings, all with psychiatric diagnoses (ADHD, Asperger's, ODD, PTSD, depression, bipolar disorder).   Her husband has Asperger's, and works in disability services.   Occasionally drinks coke or pepsi    Social Determinants of Health   Financial Resource Strain: Low Risk  (07/21/2022)   Overall Financial Resource Strain (CARDIA)    Difficulty of Paying Living Expenses: Not hard at all  Food Insecurity: No Food Insecurity (07/21/2022)   Hunger Vital Sign    Worried About Running Out of Food in the Last Year: Never true     Ran Out of Food in the Last Year: Never true  Transportation Needs: No Transportation Needs (07/21/2022)   PRAPARE - Administrator, Civil Service (Medical): No    Lack of Transportation (Non-Medical): No  Physical Activity: Insufficiently Active (07/21/2022)   Exercise Vital Sign    Days of Exercise per Week: 1 day    Minutes of Exercise per Session: 20 min  Stress: Stress Concern Present (07/21/2022)   [employer] of Occupational Health - Occupational Stress Questionnaire    Feeling of Stress : To some extent  Social Connections: Socially Integrated (07/21/2022)   Social Connection and Isolation Panel [NHANES]    Frequency of Communication with Friends and Family: Three times a week    Frequency of Social Gatherings with Friends and Family: Once a week    Attends Religious Services: More than 4 times per year    Active Member of [employer] or Organizations: Yes    Attends Engineer, structural: More than 4 times per year    Marital Status: Married  Catering manager Violence: Not At Risk (07/21/2022)   Humiliation, Afraid, Rape, and Kick questionnaire    Fear of Current or Ex-Partner: No    Emotionally Abused: No    Physically Abused: No    Sexually Abused: No      PHYSICAL EXAM  There were no vitals filed for this visit. There is no height or weight on file to calculate BMI.  Generalized: Well developed, in no acute distress  Chest: Lungs clear to auscultation bilaterally  Neurological examination  Mentation: Alert oriented to time, place, history taking. Follows all commands speech and language fluent Cranial nerve II-XII: Extraocular movements were full, visual field were full on confrontational test Head turning and shoulder shrug  were normal  and symmetric. Motor: The motor testing reveals 5 over 5 strength of all 4 extremities. Good symmetric motor tone is noted throughout.  Sensory: Sensory testing is intact to soft touch on all 4 extremities. No  evidence of extinction is noted.  Gait and station: Gait is normal.    DIAGNOSTIC DATA (LABS, IMAGING, TESTING) - I reviewed patient records, labs, notes, testing and imaging myself where available.  Lab Results  Component Value Date   WBC 5.4 04/30/2022   HGB 14.0 04/30/2022   HCT 41.4 04/30/2022   MCV 91.4 04/30/2022   PLT 193 04/30/2022      Component Value Date/Time   NA 138 09/01/2022 1605   NA 141 01/12/2020 0837   K 3.9 09/01/2022 1605   CL 108 09/01/2022 1605   CO2 25 09/01/2022 1605   GLUCOSE 132 (H) 09/01/2022 1605   BUN 17 09/01/2022 1605   BUN 16 01/12/2020 0837   CREATININE 1.40 (H) 09/01/2022 1605   CALCIUM 10.2 09/01/2022 1605   PROT 6.0 07/26/2022 0828   PROT 7.1 01/12/2020 0837   ALBUMIN 3.4 (L) 07/26/2022 0828   ALBUMIN 4.6 01/12/2020 0837   AST 38 (H) 07/26/2022 0828   ALT 36 (H) 07/26/2022 0828   ALKPHOS 99 07/26/2022 0828   BILITOT 0.7 07/26/2022 0828   BILITOT 0.7 01/12/2020 0837   GFRNONAA 54 (L) 04/30/2022 1025   GFRNONAA 85 09/08/2015 1514   GFRAA 55 (L) 01/12/2020 0837   GFRAA >89 09/08/2015 1514   Lab Results  Component Value Date   CHOL 290 (H) 07/26/2022   HDL 66.40 07/26/2022   LDLCALC 197 (H) 07/26/2022   LDLDIRECT 307.0 12/29/2021   TRIG 133.0 07/26/2022   CHOLHDL 4 07/26/2022   Lab Results  Component Value Date   HGBA1C 5.1 12/29/2021   Lab Results  Component Value Date   VITAMINB12 >1550 (H) 01/04/2021   Lab Results  Component Value Date   TSH 4.20 07/26/2022      ASSESSMENT AND PLAN 46 y.o. year old female  has a past medical history of Allergy, Anemia, Anxiety, Arthritis, Back pain, Constipation, COVID-19 (05/27/2019), Depression, Fatty liver, GERD (gastroesophageal reflux disease), Hyperlipidemia, Hypothyroidism, Infertility, female, Joint pain, Kidney problem, Membranous nephrosis, Migraines, Multiple food allergies, Muscle pain, Numbness and tingling in both hands, Pre-diabetes, Shortness of breath, Sleep apnea,  Stress, and Swelling of both lower extremities. here with:  OSA on CPAP  - CPAP compliance excellent - Good treatment of AHI  - Encourage patient to use CPAP nightly and > 4 hours each night - F/U in 1 year or sooner if needed     Butch Penny, MSN, NP-C 11/05/2022, 2:47 PM [facility] [address], [address] [place], [place] [address] [phone]

## 2022-11-06 ENCOUNTER — Telehealth: Payer: BC Managed Care – PPO | Admitting: Adult Health

## 2022-11-22 ENCOUNTER — Other Ambulatory Visit: Payer: Self-pay | Admitting: Neurology

## 2022-11-22 DIAGNOSIS — G43709 Chronic migraine without aura, not intractable, without status migrainosus: Secondary | ICD-10-CM

## 2022-11-23 ENCOUNTER — Telehealth (INDEPENDENT_AMBULATORY_CARE_PROVIDER_SITE_OTHER): Payer: BC Managed Care – PPO | Admitting: Family Medicine

## 2022-11-23 ENCOUNTER — Other Ambulatory Visit: Payer: Self-pay | Admitting: Nephrology

## 2022-11-23 VITALS — Ht 68.0 in | Wt 300.0 lb

## 2022-11-23 DIAGNOSIS — B029 Zoster without complications: Secondary | ICD-10-CM

## 2022-11-23 DIAGNOSIS — N1831 Chronic kidney disease, stage 3a: Secondary | ICD-10-CM

## 2022-11-23 MED ORDER — VALACYCLOVIR HCL 1 G PO TABS
1000.0000 mg | ORAL_TABLET | Freq: Three times a day (TID) | ORAL | 0 refills | Status: AC
Start: 2022-11-23 — End: 2022-11-30

## 2022-11-23 MED ORDER — GABAPENTIN 300 MG PO CAPS
300.0000 mg | ORAL_CAPSULE | Freq: Two times a day (BID) | ORAL | 0 refills | Status: DC
Start: 2022-11-23 — End: 2023-01-02

## 2022-11-23 NOTE — Progress Notes (Signed)
Virtual Visit via Video Note  I connected with Melanie [name] on 11/23/22 at 11:22am by a video enabled telemedicine application and verified that I am speaking with the correct person using two identifiers.   Patient Location: Home Provider Location: office - [facility].    I discussed the limitations, risks, security and privacy concerns of performing an evaluation and management service by telephone and the availability of in person appointments. I also discussed with the patient that there may be a patient responsible charge related to this service. The patient expressed understanding and agreed to proceed, consent obtained  Chief Complaint  Patient presents with   Rash    Pt reports she has shingles 9/8-9/9 spot on top of rt, buttocks Pt reports she pain,sting , tender to touch. Pt reports she can not check vitals today.    History of Present Illness: Melanie [name] is a 47 y.o. female that presents with a rash about the size of a sliver dollar on right top thigh that appeared on Saturday or Sunday. Then, yesterday morning she had two other areas of a rash. One is more linear and one is more clumped like the first area of a sliver dollar piece. Rash is very painful. Patient reports she felt pain in the first spot before before the rash. Rash is burning, deep ache. Rash is only on one side of body. Reports itching. She reports rash is fluid filled blisters.   Patient reports she went to nephrology yesterday, provider there looked at it, and advised for patient to have rash checked by PCP.   Patient Active Problem List   Diagnosis Date Noted   Hypertension 08/04/2022   Hypothyroid 12/29/2021   Vitamin D deficiency 12/26/2018   Class 3 severe obesity with serious comorbidity and body mass index (BMI) of 45.0 to 49.9 in adult [facility]) 12/26/2018   Right tennis elbow 07/30/2018   Cubital tunnel syndrome 07/30/2018   Chronic pain of both feet 01/23/2018   Bilateral calcaneal spurs  01/23/2018   Prediabetes 05/23/2017   Liver fibrosis 05/23/2017   Hyperlipidemia 04/16/2017   OSA (obstructive sleep apnea) 11/23/2015   NASH (nonalcoholic steatohepatitis) 08/27/2015   Dependent edema 08/27/2015   Obesity (BMI 30-39.9) 03/15/2015   Hepatic fibrosis 06/08/2014   Depression with anxiety 06/06/2013   Perennial allergic rhinitis 06/06/2013   Past Medical History:  Diagnosis Date   Allergy    Anemia    Anxiety    Arthritis    Back pain    Constipation    COVID-19 05/27/2019   Depression    Fatty liver    GERD (gastroesophageal reflux disease)    Hyperlipidemia    Hypothyroidism    Infertility, female    Joint pain    Kidney problem    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    Migraines    LIGHT SENSITIVITY , NOW ON IMITREX SINCE 08-02-2018,NOTICES IMPROVEMENT    Multiple food allergies    Muscle pain    Numbness and tingling in both hands    Pre-diabetes    Shortness of breath    was due to a medication side effect, now off medication , SOB resolved    Sleep apnea    CPAP   Stress    Swelling of both lower extremities    Past Surgical History:  Procedure Laterality Date   ELBOW LIGAMENT RECONSTRUCTION Right 08/21/2018   Procedure: LATERAL COLLATERAL LIGAMENT REPAIR;  Surgeon: Bjorn Pippin, MD;  Location: [facility];  Service: Orthopedics;  Laterality: Right;   FINGER SURGERY Left     index finger   KNEE SURGERY Left    LAPAROSCOPIC GASTRIC SLEEVE RESECTION N/A 05/31/2020   Procedure: LAPAROSCOPIC GASTRIC SLEEVE RESECTION;  Surgeon: Sheliah Hatch, De Blanch, MD;  Location: [facility];  Service: General;  Laterality: N/A;  2 HOURS TOTAL ROOM 1   LIVER BIOPSY N/A 05/15/2014   Procedure: LIVER BIOPSY;  Surgeon: Louis Meckel, MD;  [facility];  Service: Endoscopy;  Laterality: N/A;  ultrasound to mark the liver   TENNIS ELBOW RELEASE/NIRSCHEL PROCEDURE Right 08/21/2018   Procedure: TENNIS ELBOW RELEASE/NIRSCHEL PROCEDURE;  Surgeon: Bjorn Pippin, MD;  [facility]: [facility];  Service: Orthopedics;  Laterality: Right;   TONSILLECTOMY AND ADENOIDECTOMY  1978   TYMPANOSTOMY TUBE PLACEMENT     ULNAR NERVE TRANSPOSITION Right 08/21/2018   Procedure: ULNAR NERVE DECOMPRESSION/TRANSPOSITION;  Surgeon: Bjorn Pippin, MD;  [facility];  Service: Orthopedics;  Laterality: Right;   UPPER GI ENDOSCOPY N/A 05/31/2020   Procedure: UPPER GI ENDOSCOPY;  Surgeon: Sheliah Hatch, De Blanch, MD;  [facility]: [facility];  Service: General;  Laterality: N/A;   wrist cyst Left    Allergies  Allergen Reactions   Welchol [Colesevelam] Shortness Of Breath   Lidocaine Other (See Comments)    Flu like sx's   Pistachio Nut (Diagnostic) Itching and Swelling    Swollen tongue/itchy mouth   Relafen [Nabumetone] Other (See Comments)    migraine   Celebrex [Celecoxib] Rash   Sulfa Antibiotics Rash   Prior to Admission medications   Medication Sig Start Date End Date Taking? Authorizing Provider  buPROPion (WELLBUTRIN SR) 150 MG 12 hr tablet Take 1 tablet (150 mg total) by mouth 2 (two) times daily. 10/12/22  Yes Alveria Apley, NP  busPIRone (BUSPAR) 15 MG tablet Take 1 tablet (15 mg total) by mouth 2 (two) times daily. 08/04/22  Yes Alveria Apley, NP  Calcium Carbonate (CALCIUM 500 PO) Take 1 tablet by mouth daily.   Yes [provider]  cetirizine (ZYRTEC) 10 MG tablet Take 10 mg by mouth at bedtime.    Yes [provider]  citalopram (CELEXA) 20 MG tablet Take 1 tablet (20 mg total) by mouth daily. In addition to the 40mg  dose for a total of 60mg  09/15/22 03/14/23 Yes Alveria Apley, NP  citalopram (CELEXA) 40 MG tablet Take 1 tablet (40 mg total) by mouth daily. 08/11/22  Yes Sheliah Hatch, MD  Evolocumab (REPATHA SURECLICK) 140 MG/ML SOAJ Inject 1 pen every 14 days, must schedule OV for further refills. 01/11/22  Yes Hilty, Lisette Abu, MD  Fish Oil-Cholecalciferol (OMEGA-3 + VITAMIN D3 PO) Take 1 tablet by mouth daily. 4,000IU of  vitamin D3 115mg  of Omega-3   Yes [provider]  fluticasone (FLONASE) 50 MCG/ACT nasal spray Place 2 sprays into both nostrils daily. 12/29/21  Yes Sheliah Hatch, MD  levothyroxine (SYNTHROID) 88 MCG tablet TAKE ONE TABLET BY MOUTH DAILY BEFORE BREAKFAST 08/08/22  Yes Sheliah Hatch, MD  lisinopril (ZESTRIL) 20 MG tablet Take 1 tablet (20 mg total) by mouth daily. 09/01/22 11/30/22 Yes Alveria Apley, NP  LORazepam (ATIVAN) 1 MG tablet TAKE ONE TABLET BY MOUTH TWICE A DAY AS NEEDED FOR ANXIETY 10/03/21  Yes [name], NP  montelukast (SINGULAIR) 10 MG tablet Take 1 tablet (10 mg total) by mouth at bedtime. 08/31/22  Yes Alveria Apley, NP  Multiple Vitamin (MULTIVITAMIN WITH MINERALS)  TABS tablet Take 1 tablet by mouth daily.   Yes [provider]  Polyethyl Glycol-Propyl Glycol (LUBRICANT EYE DROPS) 0.4-0.3 % SOLN Place 1 drop into both eyes 3 (three) times daily as needed (dry/irritated eyes).   Yes [provider]  rizatriptan (MAXALT-MLT) 10 MG disintegrating tablet Take 1 tablet (10 mg total) by mouth as needed for migraine. May repeat in 2 hours if needed 07/31/22  Yes Athar, Ladell Heads, MD  Ubrogepant (UBRELVY) 100 MG TABS Take 1 tablet (100 mg total) by mouth as needed. May repeat in 2 hours if needed, no more than 2 pills in 24 hours. 06/28/22  Yes Huston Foley, MD  albuterol (VENTOLIN HFA) 108 (90 Base) MCG/ACT inhaler Inhale 2 puffs into the lungs every 6 (six) hours as needed for wheezing or shortness of breath. 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    Adopted 2 sets of siblings, all with psychiatric diagnoses (ADHD, Asperger's, ODD, PTSD, depression, bipolar disorder).   Her husband has Asperger's, and works in disability services.   Occasionally drinks coke or pepsi    Social Determinants of Health   Financial Resource Strain: Low Risk  (07/21/2022)   Overall Financial Resource Strain (CARDIA)    Difficulty of Paying Living Expenses: Not hard at all  Food Insecurity: No Food Insecurity (07/21/2022)   Hunger Vital Sign    Worried About Running Out of Food in the Last Year: Never true    Ran Out of Food in the Last Year: Never true  Transportation Needs: No Transportation Needs (07/21/2022)   PRAPARE - Administrator, Civil Service (Medical): No    Lack of Transportation (Non-Medical): No  Physical Activity: Insufficiently Active (07/21/2022)   Exercise Vital Sign    Days of Exercise per Week: 1 day    Minutes of Exercise per Session: 20 min  Stress: Stress Concern Present (07/21/2022)   [employer] of Occupational Health - Occupational Stress Questionnaire    Feeling of Stress : To some extent  Social Connections: Socially Integrated (07/21/2022)   Social Connection and Isolation Panel [NHANES]    Frequency of Communication with Friends and Family: Three times a week    Frequency of Social Gatherings with Friends and Family: Once a week    Attends  Religious Services: More than 4 times per year    Active Member of Clubs or  Organizations: Yes    Attends Engineer, structural: More than 4 times per year    Marital Status: Married  Catering manager Violence: Not At Risk (07/21/2022)   Humiliation, Afraid, Rape, and Kick questionnaire    Fear of Current or Ex-Partner: No    Emotionally Abused: No    Physically Abused: No    Sexually Abused: No    Observations/Objective: Today's Vitals   11/23/22 1038  Weight: 300 lb (136.1 kg)  Height: 5\' 8"  (1.727 m)  Patient reported vital signs, could not obtain other vital signs due to not having equipment.  Physical Exam Vitals reviewed.  Constitutional:      General: She is not in acute distress.    Appearance: Normal appearance. She is obese. She is not ill-appearing, toxic-appearing or diaphoretic.  HENT:     Head: Normocephalic and atraumatic.  Eyes:     General:        Right eye: No discharge.        Left eye: No discharge.     Conjunctiva/sclera: Conjunctivae normal.  Cardiovascular:     Rate and Rhythm: Normal rate.  Pulmonary:     Effort: Pulmonary effort is normal. No respiratory distress.  Skin:    General: Skin is dry.  Neurological:     General: No focal deficit present.     Mental Status: She is alert and oriented to person, place, and time. Mental status is at baseline.  Psychiatric:        Mood and Affect: Mood normal.        Behavior: Behavior normal.        Thought Content: Thought content normal.        Judgment: Judgment normal.     Assessment and Plan: Herpes zoster without complication -     Gabapentin; Take 1 capsule (300 mg total) by mouth 2 (two) times daily.  Dispense: 60 capsule; Refill: 0 -     valACYclovir HCl; Take 1 tablet (1,000 mg total) by mouth 3 (three) times daily for 7 days.  Dispense: 20 tablet; Refill: 0  -Prescribed Valacyclovir for herpes zoster. Take 1,000mg  TID for 7 days.  -Prescribed Gabapentin 300mg  BID for 30 days to  take for pain. Reduced the dose due to kidney function.  -Provided patient information about Shingles and discussed about the care of having shingles.  -Follow up if not improved.    I discussed the assessment and treatment plan with the patient. The patient was provided an opportunity to ask questions and all were answered. The patient agreed with the plan and demonstrated an understanding of the instructions.   The patient was advised to call back or seek an in-person evaluation if the symptoms worsen or if the condition fails to improve as anticipated.  Zandra Abts, NP

## 2022-11-23 NOTE — Telephone Encounter (Signed)
Last seen on 06/28/22 Follow up scheduled on 12/05/22

## 2022-12-01 ENCOUNTER — Ambulatory Visit
Admission: RE | Admit: 2022-12-01 | Discharge: 2022-12-01 | Disposition: A | Discharge status: Home / Self Care | Payer: BC Managed Care – PPO | Source: Ambulatory Visit | Attending: Nephrology | Admitting: Nephrology

## 2022-12-01 DIAGNOSIS — N1831 Chronic kidney disease, stage 3a: Secondary | ICD-10-CM

## 2022-12-04 NOTE — Progress Notes (Unsigned)
PATIENT: Melanie [name] DOB: October 17, 1975  REASON FOR VISIT: follow up HISTORY FROM: patient PRIMARY NEUROLOGIST: Dr. Frances Furbish  No chief complaint on file.    HISTORY OF PRESENT ILLNESS: Today 12/04/22:  Melanie [name] is a 47 y.o. female with a history of OSA on CPAP. Returns today for follow-up.        REVIEW OF SYSTEMS: Out of a complete 14 system review of symptoms, the patient complains only of the following symptoms, and all other reviewed systems are negative.  FSS ESS  ALLERGIES: Allergies  Allergen Reactions   Welchol [Colesevelam] Shortness Of Breath   Lidocaine Other (See Comments)    Flu like sx's   Pistachio Nut (Diagnostic) Itching and Swelling    Swollen tongue/itchy mouth   Relafen [Nabumetone] Other (See Comments)    migraine   Celebrex [Celecoxib] Rash   Sulfa Antibiotics Rash    HOME MEDICATIONS: Outpatient Medications Prior to Visit  Medication Sig Dispense Refill   buPROPion (WELLBUTRIN SR) 150 MG 12 hr tablet Take 1 tablet (150 mg total) by mouth 2 (two) times daily. 180 tablet 1   busPIRone (BUSPAR) 15 MG tablet Take 1 tablet (15 mg total) by mouth 2 (two) times daily. 180 tablet 1   Calcium Carbonate (CALCIUM 500 PO) Take 1 tablet by mouth daily.     cetirizine (ZYRTEC) 10 MG tablet Take 10 mg by mouth at bedtime.      citalopram (CELEXA) 20 MG tablet Take 1 tablet (20 mg total) by mouth daily. In addition to the 40mg  dose for a total of 60mg  30 tablet 5   citalopram (CELEXA) 40 MG tablet Take 1 tablet (40 mg total) by mouth daily. 90 tablet 1   Evolocumab (REPATHA SURECLICK) 140 MG/ML SOAJ Inject 1 pen every 14 days, must schedule OV for further refills. 6 mL 0   Fish Oil-Cholecalciferol (OMEGA-3 + VITAMIN D3 PO) Take 1 tablet by mouth daily. 4,000IU of vitamin D3 115mg  of Omega-3     fluticasone (FLONASE) 50 MCG/ACT nasal spray Place 2 sprays into both nostrils daily. 16 g 6   gabapentin (NEURONTIN) 300 MG capsule Take 1 capsule (300 mg  total) by mouth 2 (two) times daily. 60 capsule 0   levothyroxine (SYNTHROID) 88 MCG tablet TAKE ONE TABLET BY MOUTH DAILY BEFORE BREAKFAST 90 tablet 1   lisinopril (ZESTRIL) 20 MG tablet Take 1 tablet (20 mg total) by mouth daily. 90 tablet 0   LORazepam (ATIVAN) 1 MG tablet TAKE ONE TABLET BY MOUTH TWICE A DAY AS NEEDED FOR ANXIETY 20 tablet 0   montelukast (SINGULAIR) 10 MG tablet Take 1 tablet (10 mg total) by mouth at bedtime. 90 tablet 0   Multiple Vitamin (MULTIVITAMIN WITH MINERALS) TABS tablet Take 1 tablet by mouth daily.     Polyethyl Glycol-Propyl Glycol (LUBRICANT EYE DROPS) 0.4-0.3 % SOLN Place 1 drop into both eyes 3 (three) times daily as needed (dry/irritated eyes).     rizatriptan (MAXALT-MLT) 10 MG disintegrating tablet Take 1 tablet (10 mg total) by mouth as needed for migraine. May repeat in 2 hours if needed 9 tablet 2   topiramate (TOPAMAX) 100 MG tablet TAKE 1/2 TABLET BY MOUTH EVERY MORNING AND 1 TABLET AT BEDTIME AS DIRECTED 45 tablet 0   Ubrogepant (UBRELVY) 100 MG TABS Take 1 tablet (100 mg total) by mouth as needed. May repeat in 2 hours if needed, no more than 2 pills in 24 hours. 10 tablet 3   No facility-administered  medications prior to visit.    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    Migraines    LIGHT SENSITIVITY , NOW ON IMITREX SINCE 08-02-2018,NOTICES IMPROVEMENT    Multiple food allergies    Muscle pain    Numbness and tingling in both hands    Pre-diabetes    Shortness of breath    was due to a medication side effect, now off medication , SOB resolved    Sleep apnea    CPAP   Stress    Swelling of  both lower extremities     PAST SURGICAL HISTORY: Past Surgical History:  Procedure Laterality Date   ELBOW LIGAMENT RECONSTRUCTION Right 08/21/2018   Procedure: LATERAL COLLATERAL LIGAMENT REPAIR;  Surgeon: Bjorn Pippin, MD;  [facility];  Service: Orthopedics;  Laterality: Right;   FINGER SURGERY Left     index finger   KNEE SURGERY Left    LAPAROSCOPIC GASTRIC SLEEVE RESECTION N/A 05/31/2020   Procedure: LAPAROSCOPIC GASTRIC SLEEVE RESECTION;  Surgeon: Sheliah Hatch, De Blanch, MD;  [facility];  Service: General;  Laterality: N/A;  2 HOURS TOTAL ROOM 1   LIVER BIOPSY N/A 05/15/2014   Procedure: LIVER BIOPSY;  Surgeon: Louis Meckel, MD;  [facility];  Service: Endoscopy;  Laterality: N/A;  ultrasound to mark the liver   TENNIS ELBOW RELEASE/NIRSCHEL PROCEDURE Right 08/21/2018   Procedure: TENNIS ELBOW RELEASE/NIRSCHEL PROCEDURE;  Surgeon: Bjorn Pippin, MD;  [facility];  Service: Orthopedics;  Laterality: Right;   TONSILLECTOMY AND ADENOIDECTOMY  1978   TYMPANOSTOMY TUBE PLACEMENT     ULNAR NERVE TRANSPOSITION Right 08/21/2018   Procedure: ULNAR NERVE DECOMPRESSION/TRANSPOSITION;  Surgeon: Bjorn Pippin, MD;  [facility];  Service: Orthopedics;  Laterality: Right;   UPPER GI ENDOSCOPY N/A 05/31/2020   Procedure: UPPER GI ENDOSCOPY;  Surgeon: Sheliah Hatch, De Blanch, MD;  [facility];  Service: General;  Laterality: N/A;   wrist cyst Left     FAMILY HISTORY: Family History  Problem Relation Age of Onset   Diabetes Mother    Liver disease Mother        ESLD- unknown etiology   Obesity Mother    Kidney disease Mother    Colon cancer Mother    Hyperlipidemia Father    Melanoma Father    Migraines Sister    Bipolar disorder Brother    COPD Paternal Aunt    COPD Paternal Aunt    Diabetes Maternal Grandmother    Stroke Maternal Grandfather    Diabetes Paternal Grandmother    Breast cancer Paternal Grandmother     SOCIAL HISTORY: Social History    Socioeconomic History   Marital status: Married    Spouse name: [name]   Number of children: 5   Years of education: in [employer] program   Highest education level: Bachelor's degree (e.g., BA, AB, BS)  Occupational History   Occupation: TEACHER    Employer: [employer]    Comment: ESL  Tobacco Use  Smoking status: Never   Smokeless tobacco: Never  Vaping Use   Vaping status: Never Used  Substance and Sexual Activity   Alcohol use: No    Alcohol/week: 0.0 standard drinks of alcohol   Drug use: No   Sexual activity: Not Currently    Partners: Male    Birth control/protection: None    Comment: 1st intercourse 28yo-1 partner  Other Topics Concern   Not on file  Social History Narrative   Lives with her husband and their 5 adpoted children.    Adopted 2 sets of siblings, all with psychiatric diagnoses (ADHD, Asperger's, ODD, PTSD, depression, bipolar disorder).   Her husband has Asperger's, and works in disability services.   Occasionally drinks coke or pepsi    Social Determinants of Health   Financial Resource Strain: Low Risk  (07/21/2022)   Overall Financial Resource Strain (CARDIA)    Difficulty of Paying Living Expenses: Not hard at all  Food Insecurity: No Food Insecurity (07/21/2022)   Hunger Vital Sign    Worried About Running Out of Food in the Last Year: Never true    Ran Out of Food in the Last Year: Never true  Transportation Needs: No Transportation Needs (07/21/2022)   PRAPARE - Administrator, Civil Service (Medical): No    Lack of Transportation (Non-Medical): No  Physical Activity: Insufficiently Active (07/21/2022)   Exercise Vital Sign    Days of Exercise per Week: 1 day    Minutes of Exercise per Session: 20 min  Stress: Stress Concern Present (07/21/2022)   [employer] of Occupational Health - Occupational Stress Questionnaire    Feeling of Stress : To some extent  Social Connections: Socially Integrated (07/21/2022)    Social Connection and Isolation Panel [NHANES]    Frequency of Communication with Friends and Family: Three times a week    Frequency of Social Gatherings with Friends and Family: Once a week    Attends Religious Services: More than 4 times per year    Active Member of [employer] or Organizations: Yes    Attends Engineer, structural: More than 4 times per year    Marital Status: Married  Catering manager Violence: Not At Risk (07/21/2022)   Humiliation, Afraid, Rape, and Kick questionnaire    Fear of Current or Ex-Partner: No    Emotionally Abused: No    Physically Abused: No    Sexually Abused: No      PHYSICAL EXAM  There were no vitals filed for this visit. There is no height or weight on file to calculate BMI.  Generalized: Well developed, in no acute distress  Chest: Lungs clear to auscultation bilaterally  Neurological examination  Mentation: Alert oriented to time, place, history taking. Follows all commands speech and language fluent Cranial nerve II-XII: Extraocular movements were full, visual field were full on confrontational test Head turning and shoulder shrug  were normal and symmetric. Motor: The motor testing reveals 5 over 5 strength of all 4 extremities. Good symmetric motor tone is noted throughout.  Sensory: Sensory testing is intact to soft touch on all 4 extremities. No evidence of extinction is noted.  Gait and station: Gait is normal.    DIAGNOSTIC DATA (LABS, IMAGING, TESTING) - I reviewed patient records, labs, notes, testing and imaging myself where available.  Lab Results  Component Value Date   WBC 5.4 04/30/2022   HGB 14.0 04/30/2022   HCT 41.4 04/30/2022   MCV 91.4 04/30/2022   PLT 193 04/30/2022  Component Value Date/Time   NA 138 09/01/2022 1605   NA 141 01/12/2020 0837   K 3.9 09/01/2022 1605   CL 108 09/01/2022 1605   CO2 25 09/01/2022 1605   GLUCOSE 132 (H) 09/01/2022 1605   BUN 17 09/01/2022 1605   BUN 16 01/12/2020 0837    CREATININE 1.40 (H) 09/01/2022 1605   CALCIUM 10.2 09/01/2022 1605   PROT 6.0 07/26/2022 0828   PROT 7.1 01/12/2020 0837   ALBUMIN 3.4 (L) 07/26/2022 0828   ALBUMIN 4.6 01/12/2020 0837   AST 38 (H) 07/26/2022 0828   ALT 36 (H) 07/26/2022 0828   ALKPHOS 99 07/26/2022 0828   BILITOT 0.7 07/26/2022 0828   BILITOT 0.7 01/12/2020 0837   GFRNONAA 54 (L) 04/30/2022 1025   GFRNONAA 85 09/08/2015 1514   GFRAA 55 (L) 01/12/2020 0837   GFRAA >89 09/08/2015 1514   Lab Results  Component Value Date   CHOL 290 (H) 07/26/2022   HDL 66.40 07/26/2022   LDLCALC 197 (H) 07/26/2022   LDLDIRECT 307.0 12/29/2021   TRIG 133.0 07/26/2022   CHOLHDL 4 07/26/2022   Lab Results  Component Value Date   HGBA1C 5.1 12/29/2021   Lab Results  Component Value Date   VITAMINB12 >1550 (H) 01/04/2021   Lab Results  Component Value Date   TSH 4.20 07/26/2022      ASSESSMENT AND PLAN 47 y.o. year old female  has a past medical history of Allergy, Anemia, Anxiety, Arthritis, Back pain, Constipation, COVID-19 (05/27/2019), Depression, Fatty liver, GERD (gastroesophageal reflux disease), Hyperlipidemia, Hypothyroidism, Infertility, female, Joint pain, Kidney problem, Membranous nephrosis, Migraines, Multiple food allergies, Muscle pain, Numbness and tingling in both hands, Pre-diabetes, Shortness of breath, Sleep apnea, Stress, and Swelling of both lower extremities. here with:  OSA on CPAP  - CPAP compliance excellent - Good treatment of AHI  - Encourage patient to use CPAP nightly and > 4 hours each night - F/U in 1 year or sooner if needed   I spent *** minutes of face-to-face and non-face-to-face time with patient.  This included previsit chart review, lab review, study review, order entry, electronic health record documentation, patient education.  Butch Penny, MSN, NP-C 12/04/2022, 3:40 PM [facility] [address] [place], [place] [address] [phone]

## 2022-12-05 ENCOUNTER — Other Ambulatory Visit: Payer: Self-pay

## 2022-12-05 ENCOUNTER — Other Ambulatory Visit (HOSPITAL_COMMUNITY): Payer: Self-pay | Admitting: Nephrology

## 2022-12-05 ENCOUNTER — Ambulatory Visit: Payer: BC Managed Care – PPO | Admitting: Adult Health

## 2022-12-05 ENCOUNTER — Encounter: Payer: Self-pay | Admitting: Adult Health

## 2022-12-05 VITALS — BP 136/73 | HR 64 | Ht 68.0 in | Wt 299.0 lb

## 2022-12-05 DIAGNOSIS — I10 Essential (primary) hypertension: Secondary | ICD-10-CM

## 2022-12-05 DIAGNOSIS — R251 Tremor, unspecified: Secondary | ICD-10-CM | POA: Diagnosis not present

## 2022-12-05 DIAGNOSIS — G4733 Obstructive sleep apnea (adult) (pediatric): Secondary | ICD-10-CM

## 2022-12-05 DIAGNOSIS — N1832 Chronic kidney disease, stage 3b: Secondary | ICD-10-CM

## 2022-12-05 DIAGNOSIS — G43709 Chronic migraine without aura, not intractable, without status migrainosus: Secondary | ICD-10-CM | POA: Diagnosis not present

## 2022-12-05 MED ORDER — LISINOPRIL 20 MG PO TABS: 20.0000 mg | ORAL_TABLET | Freq: Every day | ORAL | 1 refills | Status: DC

## 2022-12-05 MED ORDER — TOPIRAMATE 100 MG PO TABS
100.0000 mg | ORAL_TABLET | Freq: Two times a day (BID) | ORAL | 3 refills | Status: DC
Start: 2022-12-05 — End: 2024-01-01

## 2022-12-05 MED ORDER — UBRELVY 100 MG PO TABS: 100.0000 mg | ORAL_TABLET | ORAL | 3 refills | Status: DC | PRN

## 2022-12-05 NOTE — Progress Notes (Signed)
Cpap order sent to [facility].

## 2022-12-05 NOTE — Patient Instructions (Addendum)
Your Plan:  Increase Topamax 100 mg twice a day Ubrelvy for abortive therapy. Take at the onset of migraine repeat in 2 hours if needed. No more than 2 tabs in 24 hours Stop Rizatriptan Will decrease Bipap pressure If your symptoms worsen or you develop new symptoms please let us know.    Thank you for coming to see [place] at [facility]. I hope we have been able to provide you high quality care today.  You may receive a patient satisfaction survey over the next few weeks. We would appreciate your feedback and comments so that we may continue to improve ourselves and the health of our patients.

## 2022-12-06 ENCOUNTER — Encounter: Payer: Self-pay | Admitting: Gastroenterology

## 2022-12-06 ENCOUNTER — Ambulatory Visit (AMBULATORY_SURGERY_CENTER): Payer: BC Managed Care – PPO | Admitting: *Deleted

## 2022-12-06 VITALS — Ht 68.0 in | Wt 300.0 lb

## 2022-12-06 DIAGNOSIS — Z1211 Encounter for screening for malignant neoplasm of colon: Secondary | ICD-10-CM

## 2022-12-06 DIAGNOSIS — Z800 Family history of malignant neoplasm of digestive organs: Secondary | ICD-10-CM

## 2022-12-06 MED ORDER — NA SULFATE-K SULFATE-MG SULF 17.5-3.13-1.6 GM/177ML PO SOLN
1.0000 | Freq: Once | ORAL | 0 refills | Status: AC
Start: 2022-12-06 — End: 2022-12-06

## 2022-12-06 NOTE — Progress Notes (Signed)

## 2022-12-13 ENCOUNTER — Other Ambulatory Visit: Payer: Self-pay

## 2022-12-13 DIAGNOSIS — T7840XS Allergy, unspecified, sequela: Secondary | ICD-10-CM

## 2022-12-13 MED ORDER — MONTELUKAST SODIUM 10 MG PO TABS: 10.0000 mg | ORAL_TABLET | Freq: Every day | ORAL | 0 refills | Status: DC

## 2022-12-14 ENCOUNTER — Telehealth: Payer: Self-pay | Admitting: Pharmacy Technician

## 2022-12-14 NOTE — Telephone Encounter (Signed)
Pharmacy Patient Advocate Encounter   Received notification from Fax that prior authorization for repatha is required/requested.   Insurance verification completed.   The patient is insured through [facility] .   Per test claim: PA required; PA submitted to [facility] via CoverMyMeds Key/confirmation #/EOC FAXED Status is pending

## 2022-12-15 ENCOUNTER — Telehealth: Payer: Self-pay

## 2022-12-15 NOTE — Telephone Encounter (Signed)
*  [facility]  Pharmacy Patient Advocate Encounter   Received notification from CoverMyMeds that prior authorization for Ubrelvy 100MG  tablets  is required/requested.   Insurance verification completed.   The patient is insured through [facility] .   Per test claim: PA required; PA submitted to [facility] via CoverMyMeds Key/confirmation #/EOC [id number] Status is pending

## 2022-12-15 NOTE — Telephone Encounter (Signed)
Pharmacy Patient Advocate Encounter  Received notification from [facility] that Prior Authorization for repatha has been APPROVED from 12/14/22 to 12/14/23   PA #/Case ID/Reference #: [id number] MF

## 2022-12-19 ENCOUNTER — Other Ambulatory Visit: Payer: Self-pay | Admitting: Neurology

## 2022-12-19 DIAGNOSIS — G43709 Chronic migraine without aura, not intractable, without status migrainosus: Secondary | ICD-10-CM

## 2022-12-20 ENCOUNTER — Ambulatory Visit: Payer: BC Managed Care – PPO | Admitting: Gastroenterology

## 2022-12-20 ENCOUNTER — Encounter: Payer: Self-pay | Admitting: Gastroenterology

## 2022-12-20 VITALS — BP 138/70 | HR 52 | Temp 98.3°F | Resp 13 | Ht 68.0 in | Wt 300.0 lb

## 2022-12-20 DIAGNOSIS — Z1211 Encounter for screening for malignant neoplasm of colon: Secondary | ICD-10-CM

## 2022-12-20 DIAGNOSIS — D122 Benign neoplasm of ascending colon: Secondary | ICD-10-CM

## 2022-12-20 DIAGNOSIS — Z800 Family history of malignant neoplasm of digestive organs: Secondary | ICD-10-CM | POA: Diagnosis not present

## 2022-12-20 DIAGNOSIS — D120 Benign neoplasm of cecum: Secondary | ICD-10-CM | POA: Diagnosis not present

## 2022-12-20 DIAGNOSIS — D123 Benign neoplasm of transverse colon: Secondary | ICD-10-CM

## 2022-12-20 MED ORDER — SODIUM CHLORIDE 0.9 % IV SOLN: 500.0000 mL | INTRAVENOUS | Status: DC

## 2022-12-20 NOTE — Op Note (Signed)
Endoscopy Center Patient Name: Melanie [name] Procedure Date: 12/20/2022 10:26 AM MRN: [id number] Endoscopist: Napoleon Form , MD, [id number] Age: 47 Referring MD:  Date of Birth: [birth year] Gender: Female Account #: [id number] Procedure:                Colonoscopy Indications:              Screening in patient at increased risk: Colorectal                            cancer in mother 51 or older Medicines:                Monitored Anesthesia Care Procedure:                Pre-Anesthesia Assessment:                           - Prior to the procedure, a History and Physical                            was performed, and patient medications and                            allergies were reviewed. The patient's tolerance of                            previous anesthesia was also reviewed. The risks                            and benefits of the procedure and the sedation                            options and risks were discussed with the patient.                            All questions were answered, and informed consent                            was obtained. Prior Anticoagulants: The patient has                            taken no anticoagulant or antiplatelet agents. ASA                            Grade Assessment: III - A patient with severe                            systemic disease. After reviewing the risks and                            benefits, the patient was deemed in satisfactory                            condition to undergo the procedure.  After obtaining informed consent, the colonoscope                            was passed under direct vision. Throughout the                            procedure, the patient's blood pressure, pulse, and                            oxygen saturations were monitored continuously. The                            Olympus PCF-H190DL (#[id number]) Colonoscope was                            introduced through the  anus and advanced to the the                            cecum, identified by appendiceal orifice and                            ileocecal valve. The colonoscopy was performed                            without difficulty. The patient tolerated the                            procedure well. The quality of the bowel                            preparation was adequate to identify polyps greater                            than 5 mm in size. The ileocecal valve, appendiceal                            orifice, and rectum were photographed. Scope In: 10:35:57 AM Scope Out: 10:54:19 AM Scope Withdrawal Time: 0 hours 15 minutes 49 seconds  Total Procedure Duration: 0 hours 18 minutes 22 seconds  Findings:                 The perianal and digital rectal examinations were                            normal.                           Four sessile polyps were found in the transverse                            colon, ascending colon and cecum. The polyps were 4                            to 10 mm in size. These polyps were removed with a  cold snare. Resection and retrieval were complete.                           Scattered small-mouthed diverticula were found in                            the sigmoid colon and descending colon.                           Non-bleeding external and internal hemorrhoids were                            found during retroflexion. The hemorrhoids were                            medium-sized. Complications:            No immediate complications. Estimated Blood Loss:     Estimated blood loss was minimal. Impression:               - Four 4 to 10 mm polyps in the transverse colon,                            in the ascending colon and in the cecum, removed                            with a cold snare. Resected and retrieved.                           - Diverticulosis in the sigmoid colon and in the                            descending colon.                            - Non-bleeding external and internal hemorrhoids. Recommendation:           - Patient has a contact number available for                            emergencies. The signs and symptoms of potential                            delayed complications were discussed with the                            patient. Return to normal activities tomorrow.                            Written discharge instructions were provided to the                            patient.                           - Resume previous diet.                           -  Continue present medications.                           - Await pathology results.                           - Repeat colonoscopy in 3 years for surveillance                            based on pathology results. Napoleon Form, MD 12/20/2022 10:59:24 AM This report has been signed electronically.

## 2022-12-20 NOTE — Progress Notes (Unsigned)
Called to room to assist during endoscopic procedure.  Patient ID and intended procedure confirmed with present staff. Received instructions for my participation in the procedure from the performing physician.  

## 2022-12-20 NOTE — Progress Notes (Unsigned)
Report to PACU, RN, vss, BBS= Clear.  

## 2022-12-20 NOTE — Patient Instructions (Addendum)
Resume previous diet.                           - Continue present medications.                           - Await pathology results.                           - Repeat colonoscopy in 3 years for surveillance                            based on pathology results.  Handout on polyps and diverticulosis.     YOU HAD AN ENDOSCOPIC PROCEDURE TODAY AT THE [facility]:   Refer to the procedure report that was given to you for any specific questions about what was found during the examination.  If the procedure report does not answer your questions, please call your gastroenterologist to clarify.  If you requested that your care partner not be given the details of your procedure findings, then the procedure report has been included in a sealed envelope for you to review at your convenience later.  YOU SHOULD EXPECT: Some feelings of bloating in the abdomen. Passage of more gas than usual.  Walking can help get rid of the air that was put into your GI tract during the procedure and reduce the bloating. If you had a lower endoscopy (such as a colonoscopy or flexible sigmoidoscopy) you may notice spotting of blood in your stool or on the toilet paper. If you underwent a bowel prep for your procedure, you may not have a normal bowel movement for a few days.  Please Note:  You might notice some irritation and congestion in your nose or some drainage.  This is from the oxygen used during your procedure.  There is no need for concern and it should clear up in a day or so.  SYMPTOMS TO REPORT IMMEDIATELY:  Following lower endoscopy (colonoscopy or flexible sigmoidoscopy):  Excessive amounts of blood in the stool  Significant tenderness or worsening of abdominal pains  Swelling of the abdomen that is new, acute  Fever of 100F or higher   For urgent or emergent issues, a gastroenterologist can be reached at any hour by calling [phone]. Do not use MyChart messaging for urgent concerns.     DIET:  We do recommend a small meal at first, but then you may proceed to your regular diet.  Drink plenty of fluids but you should avoid alcoholic beverages for 24 hours.  ACTIVITY:  You should plan to take it easy for the rest of today and you should NOT DRIVE or use heavy machinery until tomorrow (because of the sedation medicines used during the test).    FOLLOW UP: Our staff will call the number listed on your records the next business day following your procedure.  We will call around 7:15- 8:00 am to check on you and address any questions or concerns that you may have regarding the information given to you following your procedure. If we do not reach you, we will leave a message.     If any biopsies were taken you will be contacted by phone or by letter within the next 1-3 weeks.  Please call us at [phone] if you have not heard about the  biopsies in 3 weeks.    SIGNATURES/CONFIDENTIALITY: You and/or your care partner have signed paperwork which will be entered into your electronic medical record.  These signatures attest to the fact that that the information above on your After Visit Summary has been reviewed and is understood.  Full responsibility of the confidentiality of this discharge information lies with you and/or your care-partner.

## 2022-12-20 NOTE — Progress Notes (Unsigned)
[facility] History and Physical   Primary Care Physician:  Alveria Apley, NP   Reason for Procedure:  Family history of colon cancer  Plan:    Screening colonoscopy with possible interventions as needed     HPI: Melanie [name] is a very pleasant 47 y.o. female here for screening colonoscopy. Denies any nausea, vomiting, abdominal pain, melena or bright red blood per rectum  The risks and benefits as well as alternatives of endoscopic procedure(s) have been discussed and reviewed. All questions answered. The patient agrees to proceed.    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    Migraines    LIGHT SENSITIVITY , NOW ON IMITREX SINCE 08-02-2018,NOTICES IMPROVEMENT    Multiple food allergies    Muscle pain    Numbness and tingling in both hands    Pre-diabetes    Seizures (HCC)    at age 31 fever seizure only had one single seizure   Shingles    dx 11/23/22,completed antiviral   Shortness of breath    was due to a medication side effect, now off medication , SOB resolved    Sleep apnea    CPAP   Stress    Swelling of both lower extremities     Past Surgical History:  Procedure Laterality Date   ELBOW LIGAMENT RECONSTRUCTION Right 08/21/2018   Procedure: LATERAL COLLATERAL LIGAMENT REPAIR;  Surgeon: Bjorn Pippin, MD;  Location: [facility];  Service: Orthopedics;  Laterality: Right;   FINGER SURGERY Left     index finger   KNEE SURGERY Left    LAPAROSCOPIC GASTRIC SLEEVE RESECTION N/A 05/31/2020   Procedure: LAPAROSCOPIC GASTRIC SLEEVE RESECTION;  Surgeon: Sheliah Hatch, De Blanch, MD;  Location: [facility];  Service:  General;  Laterality: N/A;  2 HOURS TOTAL ROOM 1   LIVER BIOPSY N/A 05/15/2014   Procedure: LIVER BIOPSY;  Surgeon: Louis Meckel, MD;  Location: [facility];  Service: Endoscopy;  Laterality: N/A;  ultrasound to mark the liver   RENAL BIOPSY     20 years ago,updated 12/06/22   TENNIS ELBOW RELEASE/NIRSCHEL PROCEDURE Right 08/21/2018   Procedure: TENNIS ELBOW RELEASE/NIRSCHEL PROCEDURE;  Surgeon: Bjorn Pippin, MD;  Location: [facility];  Service: Orthopedics;  Laterality: Right;   TONSILLECTOMY AND ADENOIDECTOMY  1978   TYMPANOSTOMY TUBE PLACEMENT     age 74   ULNAR NERVE TRANSPOSITION Right 08/21/2018   Procedure: ULNAR NERVE DECOMPRESSION/TRANSPOSITION;  Surgeon: Bjorn Pippin, MD;  Location: [facility];  Service: Orthopedics;  Laterality: Right;   UPPER GI ENDOSCOPY N/A 05/31/2020   Procedure: UPPER GI ENDOSCOPY;  Surgeon: Sheliah Hatch, De Blanch, MD;  Location: [facility];  Service: General;  Laterality: N/A;   wrist cyst Left     Prior to Admission medications   Medication Sig Start Date End Date Taking? Authorizing Provider  buPROPion (WELLBUTRIN SR) 150 MG 12 hr tablet Take 1 tablet (150 mg total) by mouth 2 (two) times daily. 10/12/22  Yes Alveria Apley, NP  busPIRone (BUSPAR) 15  MG tablet Take 1 tablet (15 mg total) by mouth 2 (two) times daily. 08/04/22  Yes Alveria Apley, NP  Calcium Carbonate (CALCIUM 500 PO) Take 1 tablet by mouth daily.   Yes [provider]  cetirizine (ZYRTEC) 10 MG tablet Take 10 mg by mouth at bedtime.    Yes [provider]  citalopram (CELEXA) 20 MG tablet Take 1 tablet (20 mg total) by mouth daily. In addition to the 40mg  dose for a total of 60mg  09/15/22 03/14/23 Yes Alveria Apley, NP  citalopram (CELEXA) 40 MG tablet Take 1 tablet (40 mg total) by mouth daily. 08/11/22  Yes Sheliah Hatch, MD  FARXIGA 10 MG TABS tablet Take 10 mg by mouth daily. 11/28/22  Yes [provider]  Fish Oil-Cholecalciferol (OMEGA-3 + VITAMIN  D3 PO) Take 1 tablet by mouth daily. 4,000IU of vitamin D3 115mg  of Omega-3   Yes [provider]  levothyroxine (SYNTHROID) 88 MCG tablet TAKE ONE TABLET BY MOUTH DAILY BEFORE BREAKFAST 08/08/22  Yes Sheliah Hatch, MD  lisinopril (ZESTRIL) 20 MG tablet Take 1 tablet (20 mg total) by mouth daily. 12/05/22 03/05/23 Yes Alveria Apley, NP  montelukast (SINGULAIR) 10 MG tablet Take 1 tablet (10 mg total) by mouth at bedtime. 12/13/22  Yes Alveria Apley, NP  Multiple Vitamin (MULTIVITAMIN WITH MINERALS) TABS tablet Take 1 tablet by mouth daily.   Yes [provider]  topiramate (TOPAMAX) 100 MG tablet Take 1 tablet (100 mg total) by mouth 2 (two) times daily. 12/05/22  Yes Millikan, Aundra Millet, NP  Ubrogepant (UBRELVY) 100 MG TABS Take 1 tablet (100 mg total) by mouth as needed. May repeat in 2 hours if needed, no more than 2 pills in 24 hours. 12/05/22  Yes Millikan, Aundra Millet, NP  Evolocumab (REPATHA SURECLICK) 140 MG/ML SOAJ Inject 1 pen every 14 days, must schedule OV for further refills. 01/11/22   Hilty, Lisette Abu, MD  fluticasone (FLONASE) 50 MCG/ACT nasal spray Place 2 sprays into both nostrils daily. Patient taking differently: Place 2 sprays into both nostrils as needed. 12/29/21   Sheliah Hatch, MD  gabapentin (NEURONTIN) 300 MG capsule Take 1 capsule (300 mg total) by mouth 2 (two) times daily. Patient taking differently: Take 300 mg by mouth as needed. 11/23/22 12/23/22  Alveria Apley, NP  LORazepam (ATIVAN) 1 MG tablet TAKE ONE TABLET BY MOUTH TWICE A DAY AS NEEDED FOR ANXIETY 10/03/21   [name], NP  Polyethyl Glycol-Propyl Glycol (LUBRICANT EYE DROPS) 0.4-0.3 % SOLN Place 1 drop into both eyes 3 (three) times daily as needed (dry/irritated eyes).    [provider]    Current Outpatient Medications  Medication Sig Dispense Refill   buPROPion (WELLBUTRIN SR) 150 MG 12 hr tablet Take 1 tablet (150 mg total) by mouth 2 (two) times daily.  180 tablet 1   busPIRone (BUSPAR) 15 MG tablet Take 1 tablet (15 mg total) by mouth 2 (two) times daily. 180 tablet 1   Calcium Carbonate (CALCIUM 500 PO) Take 1 tablet by mouth daily.     cetirizine (ZYRTEC) 10 MG tablet Take 10 mg by mouth at bedtime.      citalopram (CELEXA) 20 MG tablet Take 1 tablet (20 mg total) by mouth daily. In addition to the 40mg  dose for a total of 60mg  30 tablet 5   citalopram (CELEXA) 40 MG tablet Take 1 tablet (40 mg total) by mouth daily. 90 tablet 1   FARXIGA 10 MG TABS  tablet Take 10 mg by mouth daily.     Fish Oil-Cholecalciferol (OMEGA-3 + VITAMIN D3 PO) Take 1 tablet by mouth daily. 4,000IU of vitamin D3 115mg  of Omega-3     levothyroxine (SYNTHROID) 88 MCG tablet TAKE ONE TABLET BY MOUTH DAILY BEFORE BREAKFAST 90 tablet 1   lisinopril (ZESTRIL) 20 MG tablet Take 1 tablet (20 mg total) by mouth daily. 90 tablet 1   montelukast (SINGULAIR) 10 MG tablet Take 1 tablet (10 mg total) by mouth at bedtime. 90 tablet 0   Multiple Vitamin (MULTIVITAMIN WITH MINERALS) TABS tablet Take 1 tablet by mouth daily.     topiramate (TOPAMAX) 100 MG tablet Take 1 tablet (100 mg total) by mouth 2 (two) times daily. 180 tablet 3   Ubrogepant (UBRELVY) 100 MG TABS Take 1 tablet (100 mg total) by mouth as needed. May repeat in 2 hours if needed, no more than 2 pills in 24 hours. 10 tablet 3   Evolocumab (REPATHA SURECLICK) 140 MG/ML SOAJ Inject 1 pen every 14 days, must schedule OV for further refills. 6 mL 0   fluticasone (FLONASE) 50 MCG/ACT nasal spray Place 2 sprays into both nostrils daily. (Patient taking differently: Place 2 sprays into both nostrils as needed.) 16 g 6   gabapentin (NEURONTIN) 300 MG capsule Take 1 capsule (300 mg total) by mouth 2 (two) times daily. (Patient taking differently: Take 300 mg by mouth as needed.) 60 capsule 0   LORazepam (ATIVAN) 1 MG tablet TAKE ONE TABLET BY MOUTH TWICE A DAY AS NEEDED FOR ANXIETY 20 tablet 0   Polyethyl Glycol-Propyl Glycol  (LUBRICANT EYE DROPS) 0.4-0.3 % SOLN Place 1 drop into both eyes 3 (three) times daily as needed (dry/irritated eyes).     Current Facility-Administered Medications  Medication Dose Route Frequency Provider Last Rate Last Admin   0.9 %  sodium chloride infusion  500 mL Intravenous Continuous Jacelyn Cuen, Eleonore Chiquito, MD        Allergies as of 12/20/2022 - Review Complete 12/20/2022  Allergen Reaction Noted   Welchol [colesevelam] Shortness Of Breath 09/11/2018   Lidocaine Other (See Comments) 10/27/2013   Pistachio nut (diagnostic) Itching and Swelling 05/12/2020   Relafen [nabumetone] Other (See Comments) 01/15/2013   Celebrex [celecoxib] Rash 01/15/2013   Sulfa antibiotics Rash 01/15/2013    Family History  Problem Relation Age of Onset   Colon polyps Mother    Colon cancer Mother 38   Diabetes Mother    Liver disease Mother        ESLD- unknown etiology   Obesity Mother    Kidney disease Mother    Hyperlipidemia Father    Melanoma Father    Colon polyps Sister    Migraines Sister    Sleep apnea Sister    Bipolar disorder Brother    COPD Paternal Aunt    COPD Paternal Aunt    Diabetes Maternal Grandmother    Stroke Maternal Grandfather    Diabetes Paternal Grandmother    Breast cancer Paternal Grandmother    Crohn's disease Neg Hx    Esophageal cancer Neg Hx    Stomach cancer Neg Hx    Rectal cancer Neg Hx    Ulcerative colitis Neg Hx     Social History   Socioeconomic History   Marital status: Married    Spouse name: [name]   Number of children: 5   Years of education: in [employer] program   Highest education level: Bachelor's degree (e.g., BA, AB, BS)  Occupational History  Occupation: Magazine features editor: [employer]    Comment: ESL  Tobacco Use   Smoking status: Never   Smokeless tobacco: Never  Vaping Use   Vaping status: Never Used  Substance and Sexual Activity   Alcohol use: No    Alcohol/week: 0.0 standard drinks of alcohol   Drug use: No    Sexual activity: Not Currently    Partners: Male    Birth control/protection: None    Comment: 1st intercourse 28yo-1 partner  Other Topics Concern   Not on file  Social History Narrative   Lives with her husband and their 5 adpoted children.    Adopted 2 sets of siblings, all with psychiatric diagnoses (ADHD, Asperger's, ODD, PTSD, depression, bipolar disorder).   Her husband has Asperger's, and works in disability services.   Occasionally drinks coke or pepsi    Social Determinants of Health   Financial Resource Strain: Low Risk  (07/21/2022)   Overall Financial Resource Strain (CARDIA)    Difficulty of Paying Living Expenses: Not hard at all  Food Insecurity: No Food Insecurity (07/21/2022)   Hunger Vital Sign    Worried About Running Out of Food in the Last Year: Never true    Ran Out of Food in the Last Year: Never true  Transportation Needs: No Transportation Needs (07/21/2022)   PRAPARE - Administrator, Civil Service (Medical): No    Lack of Transportation (Non-Medical): No  Physical Activity: Insufficiently Active (07/21/2022)   Exercise Vital Sign    Days of Exercise per Week: 1 day    Minutes of Exercise per Session: 20 min  Stress: Stress Concern Present (07/21/2022)   [employer] of Occupational Health - Occupational Stress Questionnaire    Feeling of Stress : To some extent  Social Connections: Socially Integrated (07/21/2022)   Social Connection and Isolation Panel [NHANES]    Frequency of Communication with Friends and Family: Three times a week    Frequency of Social Gatherings with Friends and Family: Once a week    Attends Religious Services: More than 4 times per year    Active Member of [employer] or Organizations: Yes    Attends Engineer, structural: More than 4 times per year    Marital Status: Married  Catering manager Violence: Not At Risk (07/21/2022)   Humiliation, Afraid, Rape, and Kick questionnaire    Fear of Current or  Ex-Partner: No    Emotionally Abused: No    Physically Abused: No    Sexually Abused: No    Review of Systems:  All other review of systems negative except as mentioned in the HPI.  Physical Exam: Vital signs in last 24 hours: BP (!) 148/73   Pulse (!) 55   Temp 98.3 F (36.8 C)   Resp 14   Ht 5\' 8"  (1.727 m)   Wt 300 lb (136.1 kg)   SpO2 99%   BMI 45.61 kg/m  General:   Alert, NAD Lungs:  Clear .   Heart:  Regular rate and rhythm Abdomen:  Soft, nontender and nondistended. Neuro/Psych:  Alert and cooperative. Normal mood and affect. A and O x 3  Reviewed labs, radiology imaging, old records and pertinent past GI work up  Patient is appropriate for planned procedure(s) and anesthesia in an ambulatory setting   K. Scherry Ran , MD [phone]

## 2022-12-21 ENCOUNTER — Encounter: Payer: Self-pay | Admitting: Gastroenterology

## 2022-12-21 ENCOUNTER — Telehealth: Payer: Self-pay | Admitting: *Deleted

## 2022-12-21 ENCOUNTER — Telehealth: Payer: Self-pay | Admitting: Gastroenterology

## 2022-12-21 NOTE — Telephone Encounter (Signed)
Patient still c/o feeling dizzy and asking if this could be from the propofol from yesterdays procedure. I explained that propofol is usually out of your system in 60-90 minutes and that I did not think that would be causing dizziness this long afterwards. I advised that she increase her fluids and make sure she is eating. She said that she has been eating and drinking normal so I advised her to contact her PCP. She explained that she had no other symptoms and would call her PCP. No further questions

## 2022-12-21 NOTE — Telephone Encounter (Signed)
Inbound call from patient stating she is still feeling very dizzy from yesterday's 10/9 colonoscopy. Patient requesting a call to discuss further. Please advise, thank you.

## 2022-12-21 NOTE — Telephone Encounter (Signed)
Called patient back, complains of constant feeling of fogginess/dizziness, feels swimmy headed.  No difference with changing position from laying to sitting or standing.  Denies any other symptoms, tolerating diet.  Reassured patient that she received only propofol 150 mg during the procedure, it should have cleared from her system at this point.  Encouraged her to increase oral fluid intake, can use over-the-counter meclizine as needed if persistent contact PCP for management of possible benign positional vertigo

## 2022-12-21 NOTE — Telephone Encounter (Signed)
  Follow up Call-     12/20/2022    9:59 AM  Call back number  Post procedure Call Back phone  # [phone]  Permission to leave phone message Yes     Patient questions:  Do you have a fever, pain , or abdominal swelling? No. Pain Score  0 *  Have you tolerated food without any problems? Yes.    Have you been able to return to your normal activities? Yes.    Do you have any questions about your discharge instructions: Diet   No. Medications  No. Follow up visit  No.  Do you have questions or concerns about your Care? No.  Actions: * If pain score is 4 or above: No action needed, pain <4.

## 2022-12-22 ENCOUNTER — Other Ambulatory Visit (HOSPITAL_COMMUNITY): Payer: Self-pay

## 2022-12-22 LAB — SURGICAL PATHOLOGY

## 2022-12-22 NOTE — Telephone Encounter (Signed)
Pharmacy Patient Advocate Encounter  Received notification from [facility] that Prior Authorization for Ubrelvy 100MG  tablets has been APPROVED from 12-15-2022 to 12-14-2023. Ran test claim, Copay is $0.00. Quantity approved is 10 tablets per 30 days. This test claim was processed through [facility]- copay amounts may vary at other pharmacies due to pharmacy/plan contracts, or as the patient moves through the different stages of their insurance plan.   PA #/Case ID/Reference #: [id number]

## 2022-12-27 ENCOUNTER — Other Ambulatory Visit: Payer: Self-pay | Admitting: Radiology

## 2022-12-27 DIAGNOSIS — N189 Chronic kidney disease, unspecified: Secondary | ICD-10-CM

## 2022-12-28 ENCOUNTER — Ambulatory Visit (HOSPITAL_COMMUNITY)
Admission: RE | Admit: 2022-12-28 | Discharge: 2022-12-28 | Disposition: A | Discharge status: Home / Self Care | Payer: BC Managed Care – PPO | Source: Ambulatory Visit | Attending: Nephrology | Admitting: Nephrology

## 2022-12-28 ENCOUNTER — Encounter (HOSPITAL_COMMUNITY): Payer: Self-pay

## 2022-12-28 ENCOUNTER — Other Ambulatory Visit (HOSPITAL_COMMUNITY): Payer: Self-pay | Admitting: Nephrology

## 2022-12-28 ENCOUNTER — Other Ambulatory Visit: Payer: Self-pay

## 2022-12-28 DIAGNOSIS — I129 Hypertensive chronic kidney disease with stage 1 through stage 4 chronic kidney disease, or unspecified chronic kidney disease: Secondary | ICD-10-CM | POA: Insufficient documentation

## 2022-12-28 DIAGNOSIS — N062 Isolated proteinuria with diffuse membranous glomerulonephritis, unspecified: Secondary | ICD-10-CM | POA: Diagnosis present

## 2022-12-28 DIAGNOSIS — N189 Chronic kidney disease, unspecified: Secondary | ICD-10-CM

## 2022-12-28 DIAGNOSIS — N1832 Chronic kidney disease, stage 3b: Secondary | ICD-10-CM | POA: Insufficient documentation

## 2022-12-28 LAB — CBC
HCT: 39.6 % (ref 36.0–46.0)
Hemoglobin: 13.5 g/dL (ref 12.0–15.0)
MCH: 30.9 pg (ref 26.0–34.0)
MCHC: 34.1 g/dL (ref 30.0–36.0)
MCV: 90.6 fL (ref 80.0–100.0)
Platelets: 171 10*3/uL (ref 150–400)
RBC: 4.37 MIL/uL (ref 3.87–5.11)
RDW: 12.8 % (ref 11.5–15.5)
WBC: 4.9 10*3/uL (ref 4.0–10.5)
nRBC: 0 % (ref 0.0–0.2)

## 2022-12-28 LAB — PROTIME-INR
INR: 0.9 (ref 0.8–1.2)
Prothrombin Time: 12.8 s (ref 11.4–15.2)

## 2022-12-28 LAB — GLUCOSE, CAPILLARY: Glucose-Capillary: 109 mg/dL — ABNORMAL HIGH (ref 70–99)

## 2022-12-28 MED ORDER — FENTANYL CITRATE (PF) 100 MCG/2ML IJ SOLN
INTRAMUSCULAR | Status: AC
  Filled 2022-12-28: qty 2

## 2022-12-28 MED ORDER — TETRACAINE HCL 1 % IJ SOLN
10.0000 mg | Freq: Once | INTRAMUSCULAR | Status: DC
  Filled 2022-12-28: qty 2

## 2022-12-28 MED ORDER — FENTANYL CITRATE (PF) 100 MCG/2ML IJ SOLN
INTRAMUSCULAR | Status: AC | PRN
Start: 2022-12-28 — End: 2022-12-28
  Administered 2022-12-28: 25 ug via INTRAVENOUS

## 2022-12-28 MED ORDER — MIDAZOLAM HCL 2 MG/2ML IJ SOLN
INTRAMUSCULAR | Status: AC | PRN
Start: 2022-12-28 — End: 2022-12-28
  Administered 2022-12-28: 1 mg via INTRAVENOUS

## 2022-12-28 MED ORDER — SODIUM CHLORIDE 0.9% FLUSH: 10.0000 mL | INTRAVENOUS | Status: DC

## 2022-12-28 MED ORDER — MIDAZOLAM HCL 2 MG/2ML IJ SOLN
INTRAMUSCULAR | Status: AC
  Filled 2022-12-28: qty 2

## 2022-12-28 MED ORDER — TETRACAINE HCL 1 % IJ SOLN
100.0000 mg | Freq: Once | INTRAMUSCULAR | Status: AC
  Administered 2022-12-28: 10 mL via INTRADERMAL
  Filled 2022-12-28: qty 10

## 2022-12-28 NOTE — H&P (Addendum)
Chief Complaint: Patient was seen in consultation today for random renal biopsy at the request of Patel,Jay K  Referring Physician(s): Patel,Jay K  Supervising Physician: Roanna Banning  Patient Status: [facility] - Out-pt  History of Present Illness: Melanie [name] is a 47 y.o. female   FULL Code status per pt Hx HTN; membranous nephropathy Chronic kidney disease III Follows with PCP Dr Cherrie Gauze Referred to Nephrology recently for evaluation and management  Scheduled now for random renal biopsy per Dr [name] Katz   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    Migraines    LIGHT SENSITIVITY , NOW ON IMITREX SINCE 08-02-2018,NOTICES IMPROVEMENT    Multiple food allergies    Muscle pain    Numbness and tingling in both hands    Pre-diabetes    Seizures (HCC)    at age 89 fever seizure only had one single seizure   Shingles    dx 11/23/22,completed antiviral   Shortness of breath    was due to a medication side effect, now off medication , SOB resolved    Sleep apnea    CPAP   Stress    Swelling of both lower extremities     Past Surgical History:  Procedure Laterality Date   ELBOW LIGAMENT RECONSTRUCTION Right 08/21/2018   Procedure: LATERAL COLLATERAL LIGAMENT REPAIR;  Surgeon: Bjorn Pippin, MD;  Location: [facility];  Service: Orthopedics;  Laterality: Right;   FINGER SURGERY Left     index finger   KNEE SURGERY Left    LAPAROSCOPIC GASTRIC SLEEVE RESECTION N/A 05/31/2020   Procedure: LAPAROSCOPIC GASTRIC SLEEVE RESECTION;  Surgeon: Sheliah Hatch, De Blanch, MD;  Location: [facility];  Service: General;  Laterality: N/A;  2 HOURS TOTAL ROOM 1    LIVER BIOPSY N/A 05/15/2014   Procedure: LIVER BIOPSY;  Surgeon: Louis Meckel, MD;  Location: [facility];  Service: Endoscopy;  Laterality: N/A;  ultrasound to mark the liver   RENAL BIOPSY     20 years ago,updated 12/06/22   TENNIS ELBOW RELEASE/NIRSCHEL PROCEDURE Right 08/21/2018   Procedure: TENNIS ELBOW RELEASE/NIRSCHEL PROCEDURE;  Surgeon: Bjorn Pippin, MD;  Location: [facility];  Service: Orthopedics;  Laterality: Right;   TONSILLECTOMY AND ADENOIDECTOMY  1978   TYMPANOSTOMY TUBE PLACEMENT     age 57   ULNAR NERVE TRANSPOSITION Right 08/21/2018   Procedure: ULNAR NERVE DECOMPRESSION/TRANSPOSITION;  Surgeon: Bjorn Pippin, MD;  Location: [facility];  Service: Orthopedics;  Laterality: Right;   UPPER GI ENDOSCOPY N/A 05/31/2020   Procedure: UPPER GI ENDOSCOPY;  Surgeon: Sheliah Hatch, De Blanch, MD;  Location: [facility];  Service: General;  Laterality: N/A;   wrist cyst Left     Allergies: Welchol [colesevelam], Lidocaine, Pistachio nut (diagnostic), Relafen [nabumetone], Celebrex [celecoxib], and Sulfa antibiotics  Medications: Prior to Admission medications   Medication Sig Start Date End Date Taking? Authorizing Provider  buPROPion (WELLBUTRIN SR) 150 MG 12 hr tablet Take 1 tablet (150 mg total) by mouth 2 (two) times daily. 10/12/22   Clinton Sawyer,  Langley Adie, NP  busPIRone (BUSPAR) 15 MG tablet Take 1 tablet (15 mg total) by mouth 2 (two) times daily. 08/04/22   Alveria Apley, NP  Calcium Carbonate (CALCIUM 500 PO) Take 1 tablet by mouth daily.    [provider]  cetirizine (ZYRTEC) 10 MG tablet Take 10 mg by mouth at bedtime.     [provider]  citalopram (CELEXA) 20 MG tablet Take 1 tablet (20 mg total) by mouth daily. In addition to the 40mg  dose for a total of 60mg  09/15/22 03/14/23  Alveria Apley, NP  citalopram (CELEXA) 40 MG tablet Take 1 tablet (40 mg total) by mouth daily. 08/11/22   Sheliah Hatch, MD  Evolocumab (REPATHA SURECLICK) 140 MG/ML SOAJ  Inject 1 pen every 14 days, must schedule OV for further refills. 01/11/22   Hilty, Lisette Abu, MD  FARXIGA 10 MG TABS tablet Take 10 mg by mouth daily. 11/28/22   [provider]  Fish Oil-Cholecalciferol (OMEGA-3 + VITAMIN D3 PO) Take 1 tablet by mouth daily. 4,000IU of vitamin D3 115mg  of Omega-3    [provider]  fluticasone (FLONASE) 50 MCG/ACT nasal spray Place 2 sprays into both nostrils daily. Patient taking differently: Place 2 sprays into both nostrils as needed. 12/29/21   Sheliah Hatch, MD  gabapentin (NEURONTIN) 300 MG capsule Take 1 capsule (300 mg total) by mouth 2 (two) times daily. Patient taking differently: Take 300 mg by mouth as needed. 11/23/22 12/23/22  Alveria Apley, NP  levothyroxine (SYNTHROID) 88 MCG tablet TAKE ONE TABLET BY MOUTH DAILY BEFORE BREAKFAST 08/08/22   Sheliah Hatch, MD  lisinopril (ZESTRIL) 20 MG tablet Take 1 tablet (20 mg total) by mouth daily. 12/05/22 03/05/23  Alveria Apley, NP  LORazepam (ATIVAN) 1 MG tablet TAKE ONE TABLET BY MOUTH TWICE A DAY AS NEEDED FOR ANXIETY 10/03/21   [name], NP  montelukast (SINGULAIR) 10 MG tablet Take 1 tablet (10 mg total) by mouth at bedtime. 12/13/22   Alveria Apley, NP  Multiple Vitamin (MULTIVITAMIN WITH MINERALS) TABS tablet Take 1 tablet by mouth daily.    [provider]  Polyethyl Glycol-Propyl Glycol (LUBRICANT EYE DROPS) 0.4-0.3 % SOLN Place 1 drop into both eyes 3 (three) times daily as needed (dry/irritated eyes).    [provider]  topiramate (TOPAMAX) 100 MG tablet Take 1 tablet (100 mg total) by mouth 2 (two) times daily. 12/05/22   Butch Penny, NP  Ubrogepant (UBRELVY) 100 MG TABS Take 1 tablet (100 mg total) by mouth as needed. May repeat in 2 hours if needed, no more than 2 pills in 24 hours. 12/05/22   Butch Penny, NP     Family History  Problem Relation Age of Onset   Colon polyps Mother    Colon cancer Mother 67    Diabetes Mother    Liver disease Mother        ESLD- unknown etiology   Obesity Mother    Kidney disease Mother    Hyperlipidemia Father    Melanoma Father    Colon polyps Sister    Migraines Sister    Sleep apnea Sister    Bipolar disorder Brother    COPD Paternal Aunt    COPD Paternal Aunt    Diabetes Maternal Grandmother    Stroke Maternal Grandfather    Diabetes Paternal Grandmother    Breast cancer Paternal Grandmother    Crohn's disease Neg Hx    Esophageal cancer Neg Hx  Stomach cancer Neg Hx    Rectal cancer Neg Hx    Ulcerative colitis Neg Hx     Social History   Socioeconomic History   Marital status: Married    Spouse name: [name]   Number of children: 5   Years of education: in [employer] program   Highest education level: Bachelor's degree (e.g., BA, AB, BS)  Occupational History   Occupation: Magazine features editor: [employer]    Comment: ESL  Tobacco Use   Smoking status: Never   Smokeless tobacco: Never  Vaping Use   Vaping status: Never Used  Substance and Sexual Activity   Alcohol use: No    Alcohol/week: 0.0 standard drinks of alcohol   Drug use: No   Sexual activity: Not Currently    Partners: Male    Birth control/protection: None    Comment: 1st intercourse 28yo-1 partner  Other Topics Concern   Not on file  Social History Narrative   Lives with her husband and their 5 adpoted children.    Adopted 2 sets of siblings, all with psychiatric diagnoses (ADHD, Asperger's, ODD, PTSD, depression, bipolar disorder).   Her husband has Asperger's, and works in disability services.   Occasionally drinks coke or pepsi    Social Determinants of Health   Financial Resource Strain: Low Risk  (07/21/2022)   Overall Financial Resource Strain (CARDIA)    Difficulty of Paying Living Expenses: Not hard at all  Food Insecurity: No Food Insecurity (07/21/2022)   Hunger Vital Sign    Worried About Running Out of Food in the Last Year: Never true     Ran Out of Food in the Last Year: Never true  Transportation Needs: No Transportation Needs (07/21/2022)   PRAPARE - Administrator, Civil Service (Medical): No    Lack of Transportation (Non-Medical): No  Physical Activity: Insufficiently Active (07/21/2022)   Exercise Vital Sign    Days of Exercise per Week: 1 day    Minutes of Exercise per Session: 20 min  Stress: Stress Concern Present (07/21/2022)   [employer] of Occupational Health - Occupational Stress Questionnaire    Feeling of Stress : To some extent  Social Connections: Socially Integrated (07/21/2022)   Social Connection and Isolation Panel [NHANES]    Frequency of Communication with Friends and Family: Three times a week    Frequency of Social Gatherings with Friends and Family: Once a week    Attends Religious Services: More than 4 times per year    Active Member of [employer] or Organizations: Yes    Attends Engineer, structural: More than 4 times per year    Marital Status: Married    Review of Systems: A 12 point ROS discussed and pertinent positives are indicated in the HPI above.  All other systems are negative.  Review of Systems  Constitutional:  Negative for activity change, fatigue and fever.  Respiratory:  Negative for cough and shortness of breath.   Cardiovascular:  Negative for chest pain.  Gastrointestinal:  Negative for abdominal pain, nausea and vomiting.  Neurological:  Negative for weakness.  Psychiatric/Behavioral:  Negative for behavioral problems and confusion.     Vital Signs: BP 134/76   Pulse 61   Temp 98.3 F (36.8 C) (Temporal)   Resp 19   Ht 5\' 8"  (1.727 m)   Wt 300 lb (136.1 kg)   SpO2 100%   BMI 45.61 kg/m     Physical Exam Vitals reviewed.  HENT:     Mouth/Throat:     Mouth: Mucous membranes are moist.  Cardiovascular:     Rate and Rhythm: Normal rate and regular rhythm.     Heart sounds: Normal heart sounds.  Pulmonary:     Effort: Pulmonary effort  is normal.     Breath sounds: Normal breath sounds. No wheezing.  Abdominal:     Palpations: Abdomen is soft.     Tenderness: There is no abdominal tenderness.  Musculoskeletal:        General: Normal range of motion.     Right lower leg: No edema.     Left lower leg: No edema.  Skin:    General: Skin is warm.  Neurological:     Mental Status: She is alert and oriented to person, place, and time.  Psychiatric:        Behavior: Behavior normal.     Imaging: US RENAL  Result Date: 12/01/2022 CLINICAL DATA:  Chronic renal disease EXAM: RENAL / URINARY TRACT ULTRASOUND COMPLETE COMPARISON:  CT chest 04/30/2022 FINDINGS: Right Kidney: Renal measurements: 10.5 x 4.5 x 6.0 cm = volume: 145.9 mL. Echogenicity within normal limits. No mass or hydronephrosis visualized. Left Kidney: Renal measurements: 11.6 x 5.8 x 5.3 cm = volume: 187.4 mL. Echogenicity within normal limits. No mass or hydronephrosis visualized. Bladder: Appears normal for degree of bladder distention. Other: None. IMPRESSION: No hydronephrosis. Electronically Signed   By: Annia Belt M.D.   On: 12/01/2022 16:36    Labs:  CBC: Recent Labs    12/29/21 1433 04/30/22 1025  WBC 7.0 5.4  HGB 14.5 14.0  HCT 42.5 41.4  PLT 234.0 193    COAGS: Recent Labs    04/30/22 1025  INR 0.9  APTT 26    BMP: Recent Labs    01/09/22 0820 04/30/22 1025 07/26/22 0828 09/01/22 1605  NA 139 140 140 138  K 4.3 3.7 4.2 3.9  CL 106 108 108 108  CO2 29 25 26 25   GLUCOSE 101* 95 103* 132*  BUN 17 16 16 17   CALCIUM 10.0 9.6 10.0 10.2  CREATININE 1.21* 1.24* 1.24* 1.40*  GFRNONAA  --  54*  --   --     LIVER FUNCTION TESTS: Recent Labs    12/29/21 1433 04/30/22 1025 07/26/22 0828  BILITOT 0.6 0.9 0.7  AST 25 30 38*  ALT 29 31 36*  ALKPHOS 105 86 99  PROT 6.1 5.8* 6.0  ALBUMIN 3.6 3.3* 3.4*    TUMOR MARKERS: No results for input(s): "AFPTM", "CEA", "CA199", "CHROMGRNA" in the last 8760 hours.  Assessment and  Plan:  Scheduled for random renal biopsy Risks and benefits of random renal biopsy was discussed with the patient and/or patient's family including, but not limited to bleeding, infection, damage to adjacent structures or low yield requiring additional tests.  All of the questions were answered and there is agreement to proceed.  Consent signed and in chart.   Thank you for this interesting consult.  I greatly enjoyed meeting TULLY BURGO and look forward to participating in their care.  A copy of this report was sent to the requesting provider on this date.  Electronically Signed: Robet Leu, PA-C 12/28/2022, 7:00 AM   I spent a total of  30 Minutes   in face to face in clinical consultation, greater than 50% of which was counseling/coordinating care for random renal biopsy

## 2022-12-28 NOTE — Procedures (Addendum)
Interventional Radiology Procedure Note  Procedure: CT guided left renal biopsy, medical renal Complications: None EBL: None Recommendations: - Bedrest 2 hours.   - Routine wound care - Follow up pathology - Advance diet  - restart all home meds   Signed,  Gilmer Mor, DO

## 2022-12-28 NOTE — Progress Notes (Signed)
Patient walked to the bathroom without difficulties

## 2023-01-01 ENCOUNTER — Encounter (HOSPITAL_COMMUNITY): Payer: Self-pay

## 2023-01-01 LAB — SURGICAL PATHOLOGY

## 2023-01-02 ENCOUNTER — Other Ambulatory Visit: Payer: Self-pay

## 2023-01-02 DIAGNOSIS — B029 Zoster without complications: Secondary | ICD-10-CM

## 2023-01-02 MED ORDER — GABAPENTIN 300 MG PO CAPS: 300.0000 mg | ORAL_CAPSULE | Freq: Two times a day (BID) | ORAL | 0 refills | Status: DC

## 2023-01-04 ENCOUNTER — Encounter (HOSPITAL_COMMUNITY): Payer: Self-pay | Admitting: *Deleted

## 2023-01-11 ENCOUNTER — Encounter: Payer: Self-pay | Admitting: Gastroenterology

## 2023-01-11 LAB — HEPATIC FUNCTION PANEL
ALT: 34 U/L (ref 7–35)
AST: 29 (ref 13–35)
Alkaline Phosphatase: 129 — AB (ref 25–125)

## 2023-01-11 LAB — BASIC METABOLIC PANEL
BUN: 22 — AB (ref 4–21)
CO2: 25 — AB (ref 13–22)
Chloride: 107 (ref 99–108)
Creatinine: 1.4 — AB (ref 0.5–1.1)
Glucose: 105
Potassium: 4.3 meq/L (ref 3.5–5.1)
Sodium: 140 (ref 137–147)

## 2023-01-11 LAB — COMPREHENSIVE METABOLIC PANEL
Albumin: 4.2 (ref 3.5–5.0)
Globulin: 1.8
eGFR: 46

## 2023-01-11 LAB — VITAMIN D 25 HYDROXY (VIT D DEFICIENCY, FRACTURES): Vit D, 25-Hydroxy: 41

## 2023-01-11 LAB — PROTEIN / CREATININE RATIO, URINE: Creatinine, Urine: 40.9

## 2023-01-23 ENCOUNTER — Other Ambulatory Visit: Payer: Self-pay | Admitting: Family Medicine

## 2023-01-23 DIAGNOSIS — F3289 Other specified depressive episodes: Secondary | ICD-10-CM

## 2023-02-01 ENCOUNTER — Encounter: Payer: Self-pay | Admitting: Family Medicine

## 2023-02-14 ENCOUNTER — Other Ambulatory Visit: Payer: Self-pay | Admitting: *Deleted

## 2023-02-14 DIAGNOSIS — F419 Anxiety disorder, unspecified: Secondary | ICD-10-CM

## 2023-02-14 DIAGNOSIS — B029 Zoster without complications: Secondary | ICD-10-CM

## 2023-02-14 DIAGNOSIS — E038 Other specified hypothyroidism: Secondary | ICD-10-CM

## 2023-02-14 MED ORDER — CITALOPRAM HYDROBROMIDE 40 MG PO TABS: 40.0000 mg | ORAL_TABLET | Freq: Every day | ORAL | 1 refills | Status: DC

## 2023-02-14 MED ORDER — GABAPENTIN 300 MG PO CAPS: 300.0000 mg | ORAL_CAPSULE | Freq: Two times a day (BID) | ORAL | 0 refills | Status: DC

## 2023-02-14 MED ORDER — LEVOTHYROXINE SODIUM 88 MCG PO TABS: ORAL_TABLET | ORAL | 1 refills | Status: DC

## 2023-02-14 NOTE — Telephone Encounter (Signed)
Patient is requesting a refill of  gabapentin (NEURONTIN) 300 MG capsule (Exp  Okay to refill?

## 2023-02-14 NOTE — Telephone Encounter (Signed)
Refill sent.

## 2023-02-26 ENCOUNTER — Telehealth: Payer: Self-pay | Admitting: Adult Health

## 2023-02-26 NOTE — Telephone Encounter (Signed)
LVM and sent mychart msg informing pt of need to reschedule 03/16/23 appt - NP out

## 2023-03-16 ENCOUNTER — Telehealth: Payer: BC Managed Care – PPO | Admitting: Adult Health

## 2023-03-23 ENCOUNTER — Other Ambulatory Visit: Payer: Self-pay

## 2023-03-23 DIAGNOSIS — B029 Zoster without complications: Secondary | ICD-10-CM

## 2023-03-23 MED ORDER — GABAPENTIN 300 MG PO CAPS: 300.0000 mg | ORAL_CAPSULE | Freq: Two times a day (BID) | ORAL | 0 refills | Status: DC

## 2023-03-23 NOTE — Telephone Encounter (Signed)
 Per notes from 11/23/2022 video visit.  -Prescribed Gabapentin  300mg  BID for 30 days to take for pain. Reduced the dose due to kidney function.  -Provided patient information about Shingles and discussed about the care of having shingles.  -Follow up if not improved.   Attempted to reach pt about the Rx refill. Left a voicemail to call us  back.

## 2023-04-04 ENCOUNTER — Other Ambulatory Visit: Payer: Self-pay | Admitting: *Deleted

## 2023-04-04 DIAGNOSIS — T7840XS Allergy, unspecified, sequela: Secondary | ICD-10-CM

## 2023-04-04 MED ORDER — MONTELUKAST SODIUM 10 MG PO TABS: 10.0000 mg | ORAL_TABLET | Freq: Every day | ORAL | 0 refills | Status: DC

## 2023-04-30 ENCOUNTER — Other Ambulatory Visit: Payer: Self-pay | Admitting: *Deleted

## 2023-04-30 DIAGNOSIS — B029 Zoster without complications: Secondary | ICD-10-CM

## 2023-04-30 MED ORDER — GABAPENTIN 300 MG PO CAPS: 300.0000 mg | ORAL_CAPSULE | Freq: Two times a day (BID) | ORAL | 0 refills | Status: DC

## 2023-05-04 ENCOUNTER — Other Ambulatory Visit: Payer: Self-pay

## 2023-05-04 DIAGNOSIS — F3289 Other specified depressive episodes: Secondary | ICD-10-CM

## 2023-05-07 MED ORDER — BUPROPION HCL ER (SR) 150 MG PO TB12
150.0000 mg | ORAL_TABLET | Freq: Two times a day (BID) | ORAL | 1 refills | Status: DC
Start: 2023-05-07 — End: 2023-11-13

## 2023-06-08 ENCOUNTER — Other Ambulatory Visit: Payer: Self-pay

## 2023-06-08 DIAGNOSIS — I10 Essential (primary) hypertension: Secondary | ICD-10-CM

## 2023-06-08 DIAGNOSIS — B029 Zoster without complications: Secondary | ICD-10-CM

## 2023-06-08 MED ORDER — GABAPENTIN 300 MG PO CAPS: 300.0000 mg | ORAL_CAPSULE | Freq: Two times a day (BID) | ORAL | 0 refills | Status: DC

## 2023-06-08 MED ORDER — LISINOPRIL 20 MG PO TABS: 20.0000 mg | ORAL_TABLET | Freq: Every day | ORAL | 1 refills | Status: DC

## 2023-06-26 ENCOUNTER — Other Ambulatory Visit: Payer: Self-pay

## 2023-06-26 DIAGNOSIS — T7840XS Allergy, unspecified, sequela: Secondary | ICD-10-CM

## 2023-06-26 MED ORDER — MONTELUKAST SODIUM 10 MG PO TABS: 10.0000 mg | ORAL_TABLET | Freq: Every day | ORAL | 0 refills | Status: DC

## 2023-07-05 ENCOUNTER — Other Ambulatory Visit: Payer: Self-pay

## 2023-07-05 DIAGNOSIS — B029 Zoster without complications: Secondary | ICD-10-CM

## 2023-07-05 MED ORDER — GABAPENTIN 300 MG PO CAPS: 300.0000 mg | ORAL_CAPSULE | Freq: Two times a day (BID) | ORAL | 0 refills | Status: DC

## 2023-07-06 ENCOUNTER — Other Ambulatory Visit: Payer: Self-pay

## 2023-07-06 DIAGNOSIS — T7840XS Allergy, unspecified, sequela: Secondary | ICD-10-CM

## 2023-07-06 MED ORDER — MONTELUKAST SODIUM 10 MG PO TABS: 10.0000 mg | ORAL_TABLET | Freq: Every day | ORAL | 0 refills | Status: DC

## 2023-07-11 ENCOUNTER — Other Ambulatory Visit: Payer: Self-pay | Admitting: Family Medicine

## 2023-07-11 DIAGNOSIS — F418 Other specified anxiety disorders: Secondary | ICD-10-CM

## 2023-07-24 ENCOUNTER — Other Ambulatory Visit: Payer: Self-pay

## 2023-07-24 DIAGNOSIS — F3289 Other specified depressive episodes: Secondary | ICD-10-CM

## 2023-07-24 MED ORDER — BUSPIRONE HCL 15 MG PO TABS: 15.0000 mg | ORAL_TABLET | Freq: Two times a day (BID) | ORAL | 1 refills | Status: DC

## 2023-08-10 ENCOUNTER — Other Ambulatory Visit: Payer: Self-pay

## 2023-08-10 DIAGNOSIS — E038 Other specified hypothyroidism: Secondary | ICD-10-CM

## 2023-08-10 MED ORDER — LEVOTHYROXINE SODIUM 88 MCG PO TABS: ORAL_TABLET | ORAL | 1 refills | Status: DC

## 2023-08-13 ENCOUNTER — Other Ambulatory Visit: Payer: Self-pay | Admitting: Family Medicine

## 2023-08-13 DIAGNOSIS — F419 Anxiety disorder, unspecified: Secondary | ICD-10-CM

## 2023-08-31 ENCOUNTER — Ambulatory Visit: Admitting: Family Medicine

## 2023-08-31 ENCOUNTER — Encounter: Payer: Self-pay | Admitting: Family Medicine

## 2023-08-31 VITALS — BP 126/74 | HR 69 | Temp 98.0°F | Wt 317.0 lb

## 2023-08-31 DIAGNOSIS — L03039 Cellulitis of unspecified toe: Secondary | ICD-10-CM | POA: Diagnosis not present

## 2023-08-31 DIAGNOSIS — M79671 Pain in right foot: Secondary | ICD-10-CM

## 2023-08-31 MED ORDER — DOXYCYCLINE HYCLATE 100 MG PO TABS: 100.0000 mg | ORAL_TABLET | Freq: Two times a day (BID) | ORAL | 0 refills | Status: DC

## 2023-08-31 NOTE — Patient Instructions (Addendum)
 I do think you have a cellulitis or infection of the skin of the fifth toe.  This is sometimes also called a paronychia.  See information below.  Start antibiotics today, elevate foot as able, warm compresses few times per day may also be helpful but as we discussed if there is increased swelling or a visible pus pocket, urgent care evaluation may be needed in the next few days.  Tylenol  as needed for pain, be seen if any new or worsening symptoms.  Hope that toe gets better soon, and thank you for coming in today.  Cellulitis, Adult  Cellulitis is a skin infection. The infected area is usually warm, red, swollen, and tender. It most commonly occurs on the lower body, such as the legs, feet, and toes, but this condition can occur on any part of the body. The infection can travel to the muscles, blood, and underlying tissue and become life-threatening without treatment. It is important to get medical treatment right away for this condition. What are the causes? Cellulitis is caused by bacteria. The bacteria enter through a break in the skin, such as a cut, burn, insect or animal bite, open sore, or crack. What increases the risk? This condition is more likely to occur in people who: Have a weak body's defense system (immune system). Are older than 50 years old. Have diabetes. Have a type of long-term (chronic) liver disease (cirrhosis) or kidney disease. Are obese. Have a skin condition such as: An itchy rash, such as eczema or psoriasis. A fungal rash on the feet or in skinfolds. Blistering rashes, such as shingles or chickenpox. Slow movement of blood in the veins (venous stasis). Fluid buildup below the skin (edema). Have open wounds on the skin, such as cuts, puncture wounds, burns, bites, scrapes, tattoos, piercings, or wounds from surgery. Have had radiation therapy. Use IV drugs. What are the signs or symptoms? Symptoms of this condition include: Skin that looks red, purple, or  slightly darker than your usual skin color. Streaks or spots on the skin. Swollen area of the skin. Tenderness or pain when an area of the skin is touched. Warm skin. Fever or chills. Blisters. Tiredness (fatigue). How is this diagnosed? This condition is diagnosed based on a medical history and physical exam. You may also have tests, including: Blood tests. Imaging tests. Tests on a sample of fluid taken from the wound (wound culture). How is this treated? Treatment for this condition may include: Medicines. These may include antibiotics or medicines to treat allergies (antihistamines). Rest. Applying cold or warm wet cloths (compresses) to the skin. If the condition is severe, you may need to stay in the hospital and get antibiotics through an IV. The infection usually starts to get better within 1-2 days of treatment. Follow these instructions at home: Medicines Take over-the-counter and prescription medicines only as told by your health care provider. If you were prescribed antibiotics, take them as told by your provider. Do not stop using the antibiotic even if you start to feel better. General instructions Drink enough fluid to keep your pee (urine) pale yellow. Do not touch or rub the infected area. Raise (elevate) the infected area above the level of your heart while you are sitting or lying down. Return to your normal activities as told by your provider. Ask your provider what activities are safe for you. Apply warm or cold compresses to the affected area as told by your provider. Keep all follow-up visits. Your provider will need to make  sure that a more serious infection is not developing. Contact a health care provider if: You have a fever. Your symptoms do not improve within 1-2 days of starting treatment or you develop new symptoms. Your bone or joint underneath the infected area becomes painful after the skin has healed. Your infection returns in the same area or  another area. Signs of this may include: You notice a swollen bump in the infected area. Your red area gets larger, turns dark in color, or becomes more painful. Drainage increases. Pus or a bad smell develops in your infected area. You have more pain. You feel ill and have muscle aches and weakness. You develop vomiting or diarrhea that will not go away. Get help right away if: You notice red streaks coming from the infected area. You notice the skin turns purple or black and falls off. This symptom may be an emergency. Get help right away. Call 911. Do not wait to see if the symptom will go away. Do not drive yourself to the hospital. This information is not intended to replace advice given to you by your health care provider. Make sure you discuss any questions you have with your health care provider. Document Revised: 10/25/2021 Document Reviewed: 10/25/2021 Elsevier Patient Education  2024 Else[employer] Inc.  Paronychia Paronychia is an infection of the skin. It happens near a fingernail or toenail. It may cause pain and swelling around the nail. In some cases, a fluid-filled bump (abscess) can form near or under the nail. Often, this condition is not serious, and it clears up with treatment. What are the causes? This condition may be caused by a germ. The germ may be bacteria or a fungus. These germs can enter the body through an opening in the skin, such as a cut or a hangnail. Other causes include: Repeated injuries to your fingernails or toenails. Irritation of the base and sides of the nail (cuticle). What increases the risk? This condition is more likely to develop in people who: Get their hands wet often, such as a dishwasher. Bite their fingernails or the base and sides of their nails. Have other skin problems. Have hangnails or hurt fingertips. Come into contact with chemicals like detergents. Have diabetes. What are the signs or symptoms? Redness and swelling of the skin  near the nail. A tender feeling around the nail. Pus-filled bumps under the skin at the base and sides of the nail. Fluid or pus under the nail. Pain in the area. How is this treated? Treatment depends on the cause of your condition and how bad it is. If your condition is mild, it may clear up on its own in a few days or after soaking in warm water. If needed, treatment may include: Antibiotic medicine. Antifungal medicine. A procedure to drain pus from a fluid-filled bump. Medicine to treat irritation and swelling (corticosteroids). Taking off part of an ingrown toenail. A bandage (dressing) may be placed over the nail area. Follow these instructions at home: Wound care Keep the affected area clean. Soak the fingers or toes in warm water as told by your doctor. You may be told to do this for 20 minutes, 2-3 times a day. Keep the area dry when you are not soaking it. Do not try to drain a fluid-filled bump on your own. Follow instructions from your doctor about how to take care of the affected area. Make sure you: Wash your hands with soap and water for at least 20 seconds before and after  you change your bandage. If you cannot use soap and water, use hand sanitizer. Change your bandage as told by your doctor. If you had a fluid-filled bump and your doctor drained it, check the area every day for signs of infection. Check for: Redness, swelling, or pain. Fluid or blood. Warmth. Pus or a bad smell. Medicines  Take over-the-counter and prescription medicines only as told by your doctor. If you were prescribed an antibiotic medicine, take it as told by your doctor. Do not stop taking it even if you start to feel better. General instructions Avoid contact with anything that irritates your skin or that you are allergic to. Do not pick at the affected area. Keep all follow-up visits. Prevention To prevent this condition from happening again: Wear rubber gloves when putting your hands  in water for washing dishes or other tasks. Wear gloves if your hands might touch cleaners or chemicals. Avoid injuring your nails or fingertips. Do not bite your nails or tear hangnails. Do not cut your nails very short. Do not cut the skin at the base and sides of the nail. Use clean nail clippers or scissors when trimming nails. Contact a doctor if: You feel worse. You do not get better. You keep having or you have more fluid, blood, or pus coming from the affected area. Your affected finger, toe, or joint gets swollen or hard to move. You have a fever or chills. There is redness spreading from the affected area. Summary Paronychia is an infection of the skin. It happens near a fingernail or toenail. This condition may cause pain and swelling around the nail. Soak the fingers or toes in warm water as told by your doctor. Often, this condition is not serious, and it clears up with treatment. This information is not intended to replace advice given to you by your health care provider. Make sure you discuss any questions you have with your health care provider. Document Revised: 05/31/2020 Document Reviewed: 05/31/2020 Elsevier Patient Education  2024 [employer].

## 2023-08-31 NOTE — Progress Notes (Signed)
 Subjective:  Patient ID: Melanie [name], female    DOB: 1975/12/19  Age: 48 y.o. MRN: [id number]  CC:  Chief Complaint  Patient presents with   Toe Pain    Toe pain on right foot. Toe is very swollen and sore and is making the rest of her foot swollen and sore.     HPI Melanie [name] presents for acute visit for above, PCP is Francenia Ingle, NP   Right toe pain, foot pain.  5th toe has been sore, swollen initially - past week. Sore initially, then swelling past 2 days. Possibly from hangnail - pulled about a week ago, no bleeding. Feels like getting worse, more sore last night - into foot. Redness seems to be moving up.   Tx: cold spray - minimal effect.    History Patient Active Problem List   Diagnosis Date Noted   Hypertension 08/04/2022   Hypothyroid 12/29/2021   Vitamin D  deficiency 12/26/2018   Class 3 severe obesity with serious comorbidity and body mass index (BMI) of 45.0 to 49.9 in adult 12/26/2018   Right tennis elbow 07/30/2018   Cubital tunnel syndrome 07/30/2018   Chronic pain of both feet 01/23/2018   Bilateral calcaneal spurs 01/23/2018   Prediabetes 05/23/2017   Liver fibrosis 05/23/2017   Hyperlipidemia 04/16/2017   OSA (obstructive sleep apnea) 11/23/2015   NASH (nonalcoholic steatohepatitis) 08/27/2015   Dependent edema 08/27/2015   Obesity (BMI 30-39.9) 03/15/2015   Hepatic fibrosis 06/08/2014   Depression with anxiety 06/06/2013   Perennial allergic rhinitis 06/06/2013   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    Migraines    LIGHT SENSITIVITY , NOW ON IMITREX  SINCE 08-02-2018,NOTICES IMPROVEMENT     Multiple food allergies    Muscle pain    Numbness and tingling in both hands    Pre-diabetes    Seizures (HCC)    at age 21 fever seizure only had one single seizure   Shingles    dx 11/23/22,completed antiviral   Shortness of breath    was due to a medication side effect, now off medication , SOB resolved    Sleep apnea    CPAP   Stress    Swelling of both lower extremities    Past Surgical History:  Procedure Laterality Date   ELBOW LIGAMENT RECONSTRUCTION Right 08/21/2018   Procedure: LATERAL COLLATERAL LIGAMENT REPAIR;  Surgeon: Micheline Ahr, MD;  Location: [facility];  Service: Orthopedics;  Laterality: Right;   FINGER SURGERY Left     index finger   KNEE SURGERY Left    LAPAROSCOPIC GASTRIC SLEEVE RESECTION N/A 05/31/2020   Procedure: LAPAROSCOPIC GASTRIC SLEEVE RESECTION;  Surgeon: Dorrie Gaudier, Alphonso Aschoff, MD;  Location: [facility];  Service: General;  Laterality: N/A;  2 HOURS TOTAL ROOM 1   LIVER BIOPSY N/A 05/15/2014   Procedure: LIVER BIOPSY;  Surgeon: Claudette Cue, MD;  Location: [facility];  Service: Endoscopy;  Laterality: N/A;  ultrasound to mark the liver   RENAL BIOPSY  20 years ago,updated 12/06/22   TENNIS ELBOW RELEASE/NIRSCHEL PROCEDURE Right 08/21/2018   Procedure: TENNIS ELBOW RELEASE/NIRSCHEL PROCEDURE;  Surgeon: Micheline Ahr, MD;  Location: [facility];  Service: Orthopedics;  Laterality: Right;   TONSILLECTOMY AND ADENOIDECTOMY  1978   TYMPANOSTOMY TUBE PLACEMENT     age 83   ULNAR NERVE TRANSPOSITION Right 08/21/2018   Procedure: ULNAR NERVE DECOMPRESSION/TRANSPOSITION;  Surgeon: Micheline Ahr, MD;  Location: [facility];  Service: Orthopedics;  Laterality: Right;   UPPER GI ENDOSCOPY N/A 05/31/2020   Procedure: UPPER GI ENDOSCOPY;  Surgeon: Dorrie Gaudier, Alphonso Aschoff, MD;  Location: [facility];  Service: General;  Laterality: N/A;   wrist cyst Left    Allergies  Allergen Reactions   Welchol  [Colesevelam ] Shortness Of Breath   Lidocaine  Other (See Comments)    Flu  like sx's,topical   Pistachio Nut (Diagnostic) Itching and Swelling    Swollen tongue/itchy mouth   Relafen [Nabumetone] Other (See Comments)    migraine   Celebrex [Celecoxib] Rash   Sulfa Antibiotics Rash   Prior to Admission medications   Medication Sig Start Date End Date Taking? Authorizing Provider  buPROPion  (WELLBUTRIN  SR) 150 MG 12 hr tablet Take 1 tablet (150 mg total) by mouth 2 (two) times daily. 05/07/23  Yes Francenia Ingle, NP  busPIRone  (BUSPAR ) 15 MG tablet Take 1 tablet (15 mg total) by mouth 2 (two) times daily. 07/24/23  Yes Williamson, Joanna R, NP  Calcium  Carbonate (CALCIUM  500 PO) Take 1 tablet by mouth daily.   Yes [provider]  cetirizine (ZYRTEC) 10 MG tablet Take 10 mg by mouth at bedtime.    Yes [provider]  citalopram  (CELEXA ) 20 MG tablet TAKE 1 TABLET BY MOUTH DAILY IN ADDITION TO THE 40 MG DOSE FOR TOTAL OF 60 MG 07/11/23  Yes Williamson, Joanna R, NP  citalopram  (CELEXA ) 40 MG tablet TAKE 1 TABLET BY MOUTH DAILY 08/13/23  Yes Williamson, Joanna R, NP  Evolocumab  (REPATHA  SURECLICK) 140 MG/ML SOAJ Inject 1 pen every 14 days, must schedule OV for further refills. 01/11/22  Yes Hilty, Aviva Lemmings, MD  FARXIGA 10 MG TABS tablet Take 10 mg by mouth daily. 11/28/22  Yes [provider]  Fish Oil-Cholecalciferol (OMEGA-3 + VITAMIN D3 PO) Take 1 tablet by mouth daily. 4,000IU of vitamin D3 115mg  of Omega-3   Yes [provider]  fluticasone  (FLONASE ) 50 MCG/ACT nasal spray Place 2 sprays into both nostrils daily. Patient taking differently: Place 2 sprays into both nostrils as needed. 12/29/21  Yes Jess Morita, MD  levothyroxine  (SYNTHROID ) 88 MCG tablet TAKE ONE TABLET BY MOUTH DAILY BEFORE BREAKFAST 08/10/23  Yes Williamson, Joanna R, NP  lisinopril  (ZESTRIL ) 20 MG tablet Take 1 tablet (20 mg total) by mouth daily. 06/08/23 09/06/23 Yes Francenia Ingle, NP  LORazepam  (ATIVAN ) 1 MG tablet TAKE ONE TABLET BY MOUTH  TWICE A DAY AS NEEDED FOR ANXIETY 10/03/21  Yes Ulyess Gammons, NP  montelukast  (SINGULAIR ) 10 MG tablet Take 1 tablet (10 mg total) by mouth at bedtime. 07/06/23  Yes Francenia Ingle, NP  Multiple Vitamin (MULTIVITAMIN WITH MINERALS) TABS tablet Take 1 tablet by mouth daily.   Yes [provider]  Polyethyl Glycol-Propyl Glycol (LUBRICANT EYE DROPS) 0.4-0.3 % SOLN Place 1 drop into both eyes 3 (three) times daily as needed (dry/irritated eyes).   Yes [provider]  topiramate  (TOPAMAX ) 100 MG tablet Take 1 tablet (100 mg total) by mouth 2 (two) times daily. 12/05/22  Yes Millikan, Megan, NP  Ubrogepant  (UBRELVY ) 100 MG TABS Take 1 tablet (100 mg total) by mouth as needed. May repeat in 2 hours if needed, no more than 2 pills in 24 hours. 12/05/22  Yes Millikan, Megan, NP  gabapentin  (NEURONTIN ) 300 MG capsule Take 1 capsule (300 mg total) by mouth 2 (two) times daily. Patient not taking: Reported on 08/31/2023 07/05/23 08/04/23  Francenia Ingle, NP   Social History   Socioeconomic History   Marital status: Married    Spouse name: [name]   Number of children: 5   Years of education: in [employer] program   Highest education level: Bachelor's degree (e.g., BA, AB, BS)  Occupational History   Occupation: Magazine features editor: [employer]    Comment: ESL  Tobacco Use   Smoking status: Never   Smokeless tobacco: Never  Vaping Use   Vaping status: Never Used  Substance and Sexual Activity   Alcohol use: No    Alcohol/week: 0.0 standard drinks of alcohol   Drug use: No   Sexual activity: Not Currently    Partners: Male    Birth control/protection: None    Comment: 1st intercourse 28yo-1 partner  Other Topics Concern   Not on file  Social History Narrative   Lives with her husband and their 5 adpoted children.    Adopted 2 sets of siblings, all with psychiatric diagnoses (ADHD, Asperger's, ODD, PTSD, depression, bipolar disorder).   Her husband has  Asperger's, and works in disability services.   Occasionally drinks coke or pepsi    Social Drivers of Health   Financial Resource Strain: Low Risk  (07/21/2022)   Overall Financial Resource Strain (CARDIA)    Difficulty of Paying Living Expenses: Not hard at all  Food Insecurity: No Food Insecurity (07/21/2022)   Hunger Vital Sign    Worried About Running Out of Food in the Last Year: Never true    Ran Out of Food in the Last Year: Never true  Transportation Needs: No Transportation Needs (07/21/2022)   PRAPARE - Administrator, Civil Service (Medical): No    Lack of Transportation (Non-Medical): No  Physical Activity: Insufficiently Active (07/21/2022)   Exercise Vital Sign    Days of Exercise per Week: 1 day    Minutes of Exercise per Session: 20 min  Stress: Stress Concern Present (07/21/2022)   [employer] of Occupational Health - Occupational Stress Questionnaire    Feeling of Stress : To some extent  Social Connections: Socially Integrated (07/21/2022)   Social Connection and Isolation Panel    Frequency of Communication with Friends and Family: Three times a week    Frequency of Social Gatherings with Friends and Family: Once a week    Attends Religious Services: More than 4 times per year    Active Member of [employer] or Organizations: Yes    Attends Engineer, structural: More than 4 times per year    Marital Status: Married  Catering manager Violence: Not At Risk (07/21/2022)   Humiliation, Afraid, Rape, and Kick questionnaire    Fear of Current or Ex-Partner: No    Emotionally Abused: No    Physically Abused: No    Sexually Abused: No    Review of Systems   Objective:   Vitals:   08/31/23 1126  BP: 126/74  Pulse: 69  Temp: 98 F (36.7 C)  TempSrc: Temporal  SpO2: 99%  Weight: (!) 317 lb (143.8 kg)  Physical Exam Vitals reviewed.  Constitutional:      General: She is not in acute distress.    Appearance: Normal appearance. She is  well-developed.  HENT:     Head: Normocephalic and atraumatic.   Cardiovascular:     Rate and Rhythm: Normal rate.  Pulmonary:     Effort: Pulmonary effort is normal.   Musculoskeletal:     Comments: Right fifth toe with erythema, edema at medial greater than proximal and lateral aspect.  No identifiable fluctuance or discharge.  Tender to palpation primarily medial fifth toe, slight discomfort into the base of the fifth toe, minimal discomfort into lateral toes.  Erythema limited to toe, see photo.   Neurological:     Mental Status: She is alert and oriented to person, place, and time.   Psychiatric:        Mood and Affect: Mood normal.      Assessment & Plan:  Melanie [name] is a 48 y.o. female . Cellulitis of fifth toe - Plan: doxycycline  (VIBRA -TABS) 100 MG tablet  Right foot pain - Plan: doxycycline  (VIBRA -TABS) 100 MG tablet Right fifth toe cellulitis, possibly from prior hangnail removal.  No identifiable abscess at this time.    Start doxycycline  with potential side effects discussed, warm soaks or warm compresses, foot elevation, Tylenol  as needed for pain.  Declined additional meds.  RTC precautions, urgent care precautions given if worsening as may need incision/drainage, but does not appear necessary at this time.  All questions answered.  Meds ordered this encounter  Medications   doxycycline  (VIBRA -TABS) 100 MG tablet    Sig: Take 1 tablet (100 mg total) by mouth 2 (two) times daily.    Dispense:  20 tablet    Refill:  0   Patient Instructions  I do think you have a cellulitis or infection of the skin of the fifth toe.  This is sometimes also called a paronychia.  See information below.  Start antibiotics today, elevate foot as able, warm compresses few times per day may also be helpful but as we discussed if there is increased swelling or a visible pus pocket, urgent care evaluation may be needed in the next few days.  Tylenol  as needed for pain, be seen if any new  or worsening symptoms.  Hope that toe gets better soon, and thank you for coming in today.    Signed,   Caro Christmas, MD [facility], [facility] 08/31/23 12:04 PM

## 2023-11-08 ENCOUNTER — Other Ambulatory Visit: Payer: Self-pay | Admitting: Adult Health

## 2023-11-08 NOTE — Telephone Encounter (Signed)
 Due for appt. Two refills given with message to schedule appt.

## 2023-11-13 ENCOUNTER — Other Ambulatory Visit: Payer: Self-pay

## 2023-11-13 DIAGNOSIS — F3289 Other specified depressive episodes: Secondary | ICD-10-CM

## 2023-11-13 DIAGNOSIS — F418 Other specified anxiety disorders: Secondary | ICD-10-CM

## 2023-11-13 MED ORDER — CITALOPRAM HYDROBROMIDE 20 MG PO TABS: 20.0000 mg | ORAL_TABLET | Freq: Every day | ORAL | 1 refills | Status: DC

## 2023-11-13 MED ORDER — BUPROPION HCL ER (SR) 150 MG PO TB12
150.0000 mg | ORAL_TABLET | Freq: Two times a day (BID) | ORAL | 1 refills | Status: AC
Start: 2023-11-13 — End: ?

## 2023-11-29 ENCOUNTER — Other Ambulatory Visit: Payer: Self-pay

## 2023-11-29 ENCOUNTER — Emergency Department (HOSPITAL_COMMUNITY)
Admission: EM | Admit: 2023-11-29 | Discharge: 2023-11-29 | Disposition: A | Discharge status: Home / Self Care | Source: Ambulatory Visit | Attending: Emergency Medicine | Admitting: Emergency Medicine

## 2023-11-29 ENCOUNTER — Encounter (HOSPITAL_COMMUNITY): Payer: Self-pay

## 2023-11-29 ENCOUNTER — Ambulatory Visit: Payer: Self-pay

## 2023-11-29 DIAGNOSIS — Z7984 Long term (current) use of oral hypoglycemic drugs: Secondary | ICD-10-CM | POA: Diagnosis not present

## 2023-11-29 DIAGNOSIS — E162 Hypoglycemia, unspecified: Secondary | ICD-10-CM | POA: Diagnosis present

## 2023-11-29 LAB — CBC
HCT: 45.2 % (ref 36.0–46.0)
Hemoglobin: 14.7 g/dL (ref 12.0–15.0)
MCH: 30.1 pg (ref 26.0–34.0)
MCHC: 32.5 g/dL (ref 30.0–36.0)
MCV: 92.6 fL (ref 80.0–100.0)
Platelets: 197 K/uL (ref 150–400)
RBC: 4.88 MIL/uL (ref 3.87–5.11)
RDW: 12.2 % (ref 11.5–15.5)
WBC: 6 K/uL (ref 4.0–10.5)
nRBC: 0 % (ref 0.0–0.2)

## 2023-11-29 LAB — COMPREHENSIVE METABOLIC PANEL WITH GFR
ALT: 40 U/L (ref 0–44)
AST: 36 U/L (ref 15–41)
Albumin: 3.7 g/dL (ref 3.5–5.0)
Alkaline Phosphatase: 103 U/L (ref 38–126)
Anion gap: 13 (ref 5–15)
BUN: 20 mg/dL (ref 6–20)
CO2: 19 mmol/L — ABNORMAL LOW (ref 22–32)
Calcium: 10.2 mg/dL (ref 8.9–10.3)
Chloride: 107 mmol/L (ref 98–111)
Creatinine, Ser: 1.29 mg/dL — ABNORMAL HIGH (ref 0.44–1.00)
GFR, Estimated: 51 mL/min — ABNORMAL LOW (ref >60)
Glucose, Bld: 119 mg/dL — ABNORMAL HIGH (ref 70–99)
Potassium: 4.1 mmol/L (ref 3.5–5.1)
Sodium: 139 mmol/L (ref 135–145)
Total Bilirubin: 0.5 mg/dL (ref 0.0–1.2)
Total Protein: 5.8 g/dL — ABNORMAL LOW (ref 6.5–8.1)

## 2023-11-29 LAB — CBG MONITORING, ED
Glucose-Capillary: 96 mg/dL (ref 70–99)
Glucose-Capillary: 126 mg/dL — ABNORMAL HIGH (ref 70–99)

## 2023-11-29 NOTE — Telephone Encounter (Signed)
 Patient called in, stated previous NT told her to call and give an update from the ED. Patient not experiencing any new or worsening symptoms, blood sugar is back up. Appointment with PCP tomorrow.

## 2023-11-29 NOTE — ED Triage Notes (Addendum)
 Pt presents with near syncope episode and starting to feel shaky and unwell. Pt states to have taken she blood sugar and it was 50.  Pt was given sandwiches and apple juice to rise blood sugar.  Last CBG check read 126.

## 2023-11-29 NOTE — ED Provider Notes (Signed)
  EMERGENCY DEPARTMENT AT [facility] Provider Note   CSN: [id number] Arrival date & time: 11/29/23  1225     Patient presents with: Near Syncope   Melanie [name] is a 48 y.o. female.   48 year old female presents due to low blood sugar.  Patient states that she has no prior history of diabetes.  She states she felt shaky and weak.  Took her blood sugar and it was 50.  Did eat some sugar with some good response.  Denies any recent history of illness.  No recent medication changes.  No vomiting or diarrhea.       Prior to Admission medications   Medication Sig Start Date End Date Taking? Authorizing Provider  buPROPion  (WELLBUTRIN  SR) 150 MG 12 hr tablet Take 1 tablet (150 mg total) by mouth 2 (two) times daily. 11/13/23   Billy Philippe SAUNDERS, NP  busPIRone  (BUSPAR ) 15 MG tablet Take 1 tablet (15 mg total) by mouth 2 (two) times daily. 07/24/23   Williamson, Joanna R, NP  Calcium  Carbonate (CALCIUM  500 PO) Take 1 tablet by mouth daily.    [provider]  cetirizine (ZYRTEC) 10 MG tablet Take 10 mg by mouth at bedtime.     [provider]  citalopram  (CELEXA ) 20 MG tablet Take 1 tablet (20 mg total) by mouth daily. 11/13/23   Billy Philippe SAUNDERS, NP  citalopram  (CELEXA ) 40 MG tablet TAKE 1 TABLET BY MOUTH DAILY 08/13/23   Williamson, Joanna R, NP  doxycycline  (VIBRA -TABS) 100 MG tablet Take 1 tablet (100 mg total) by mouth 2 (two) times daily. 08/31/23   Levora Reyes SAUNDERS, MD  Evolocumab  (REPATHA  SURECLICK) 140 MG/ML SOAJ Inject 1 pen every 14 days, must schedule OV for further refills. 01/11/22   Hilty, Vinie BROCKS, MD  FARXIGA 10 MG TABS tablet Take 10 mg by mouth daily. 11/28/22   [provider]  Fish Oil-Cholecalciferol (OMEGA-3 + VITAMIN D3 PO) Take 1 tablet by mouth daily. 4,000IU of vitamin D3 115mg  of Omega-3    [provider]  fluticasone  (FLONASE ) 50 MCG/ACT nasal spray Place 2 sprays into both nostrils daily. Patient taking  differently: Place 2 sprays into both nostrils as needed. 12/29/21   Tabori, Katherine E, MD  gabapentin  (NEURONTIN ) 300 MG capsule Take 1 capsule (300 mg total) by mouth 2 (two) times daily. Patient not taking: Reported on 08/31/2023 07/05/23 08/04/23  Billy Philippe SAUNDERS, NP  levothyroxine  (SYNTHROID ) 88 MCG tablet TAKE ONE TABLET BY MOUTH DAILY BEFORE BREAKFAST 08/10/23   Williamson, Joanna R, NP  lisinopril  (ZESTRIL ) 20 MG tablet Take 1 tablet (20 mg total) by mouth daily. 06/08/23 09/06/23  Billy Philippe SAUNDERS, NP  LORazepam  (ATIVAN ) 1 MG tablet TAKE ONE TABLET BY MOUTH TWICE A DAY AS NEEDED FOR ANXIETY 10/03/21   Kip Ade, NP  montelukast  (SINGULAIR ) 10 MG tablet Take 1 tablet (10 mg total) by mouth at bedtime. 07/06/23   Williamson, Joanna R, NP  Multiple Vitamin (MULTIVITAMIN WITH MINERALS) TABS tablet Take 1 tablet by mouth daily.    [provider]  Polyethyl Glycol-Propyl Glycol (LUBRICANT EYE DROPS) 0.4-0.3 % SOLN Place 1 drop into both eyes 3 (three) times daily as needed (dry/irritated eyes).    [provider]  topiramate  (TOPAMAX ) 100 MG tablet Take 1 tablet (100 mg total) by mouth 2 (two) times daily. 12/05/22   Millikan, Megan, NP  Ubrogepant  (UBRELVY ) 100 MG TABS Take 1 tablet (100 mg total) by mouth as needed. May repeat in  2 hours if needed, no more than 2 pills in 24 hours. Must be seen for further refills. 11/08/23   Sherryl Bouchard, NP    Allergies: Welchol  [colesevelam ], Lidocaine , Pistachio nut (diagnostic), Relafen [nabumetone], Celebrex [celecoxib], and Sulfa antibiotics    Review of Systems  All other systems reviewed and are negative.   Updated Vital Signs BP (!) 178/117 (BP Location: Right Arm)   Pulse 73   Temp 98.4 F (36.9 C) (Oral)   Resp 18   Ht 1.626 m (5' 4)   Wt (!) 142.9 kg   SpO2 98%   BMI 54.07 kg/m   Physical Exam Vitals and nursing note reviewed.  Constitutional:      General: She is not in acute distress.    Appearance:  Normal appearance. She is well-developed. She is not toxic-appearing.  HENT:     Head: Normocephalic and atraumatic.  Eyes:     General: Lids are normal.     Conjunctiva/sclera: Conjunctivae normal.     Pupils: Pupils are equal, round, and reactive to light.  Neck:     Thyroid : No thyroid  mass.     Trachea: No tracheal deviation.  Cardiovascular:     Rate and Rhythm: Normal rate and regular rhythm.     Heart sounds: Normal heart sounds. No murmur heard.    No gallop.  Pulmonary:     Effort: Pulmonary effort is normal. No respiratory distress.     Breath sounds: Normal breath sounds. No stridor. No decreased breath sounds, wheezing, rhonchi or rales.  Abdominal:     General: There is no distension.     Palpations: Abdomen is soft.     Tenderness: There is no abdominal tenderness. There is no rebound.  Musculoskeletal:        General: No tenderness. Normal range of motion.     Cervical back: Normal range of motion and neck supple.  Skin:    General: Skin is warm and dry.     Findings: No abrasion or rash.  Neurological:     Mental Status: She is alert and oriented to person, place, and time. Mental status is at baseline.     GCS: GCS eye subscore is 4. GCS verbal subscore is 5. GCS motor subscore is 6.     Cranial Nerves: No cranial nerve deficit.     Sensory: No sensory deficit.     Motor: Motor function is intact.  Psychiatric:        Attention and Perception: Attention normal.        Speech: Speech normal.        Behavior: Behavior normal.     (all labs ordered are listed, but only abnormal results are displayed) Labs Reviewed  COMPREHENSIVE METABOLIC PANEL WITH GFR  CBC  URINALYSIS, ROUTINE W REFLEX MICROSCOPIC  CBG MONITORING, ED  CBG MONITORING, ED    EKG: EKG Interpretation Date/Time:  Thursday November 29 2023 12:42:47 EDT Ventricular Rate:  73 PR Interval:  166 QRS Duration:  93 QT Interval:  397 QTC Calculation: 438 R Axis:   63  Text  Interpretation: Sinus rhythm Low voltage, precordial leads No significant change since last tracing Confirmed by Dasie Faden ([id number]) on 11/29/2023 1:05:57 PM  Radiology: No results found.   Procedures   Medications Ordered in the ED - No data to display  Medical Decision Making Amount and/or Complexity of Data Reviewed Labs: ordered.   EKG shows sinus rhythm.  Patient is CBC electrolytes are reassuring here.  Patient's blood sugar rechecked is 126.  Unclear the etiology of her hypoglycemia.  She has a follow-up appoint with her physician tomorrow.     Final diagnoses:  None    ED Discharge Orders     None          Dasie Faden, MD 11/29/23 1525

## 2023-11-29 NOTE — Telephone Encounter (Signed)
 FYI Only or Action Required?: FYI only for provider.  Patient was last seen in primary care on 08/31/2023 by Levora Reyes SAUNDERS, MD.  Called Nurse Triage reporting Blood Sugar Problem.  Symptoms began today.  Interventions attempted: Other: 1oz soda.  Symptoms are: unchanged.  Triage Disposition: Go to ED Now (Notify PCP)  Patient/caregiver understands and will follow disposition?: Yes     Copied from CRM [id number]. Topic: Clinical - Red Word Triage >> Nov 29, 2023 12:02 PM Melanie [name] wrote: Red Word that prompted transfer to Nurse Triage: Patient is calling in with a blood sugar of 50 and has been feeling very shaky, and off for the past hour. Reason for Disposition  [1] Vomiting AND [2] signs of dehydration (e.g., very dry mouth, lightheaded, dark urine)  Answer Assessment - Initial Assessment Questions 1. SYMPTOMS: What symptoms are you concerned about?     Shaky, somewhat off, nervous , jittery 2. ONSET:  When did the symptoms start?     today 3. BLOOD GLUCOSE: What is your blood glucose level?      50 @1150  4. USUAL RANGE: What is your blood glucose level usually? (e.g., usual fasting morning value, usual evening value)     Pt is not a DM 5. TYPE 1 or 2:  Do you know what type of diabetes you have?  (e.g., Type 1, Type 2, Gestational; doesn't know)      2  6. INSULIN : Do you take insulin ? What type of insulin (s) do you use? What is the mode of delivery? (syringe, pen; injection or pump) When did you last give yourself an insulin  dose? (i.e., time or hours/minutes ago) How much did you give? (i.e., how many units)     no 7. DIABETES PILLS: Do you take any pills for your diabetes? If Yes, ask: What is the name of the medicine(s) that you take for high blood sugar?     no 8. OTHER SYMPTOMS: Do you have any symptoms? (e.g., fever, frequent urination, difficulty breathing, vomiting)     na 9. LOW BLOOD GLUCOSE TREATMENT: What have you done so far to treat  the low blood glucose level?     Pt drank some off brand soda but has not rechecked the BS 10. FOOD: When did you last eat or drink?       no 11. ALONE: Are you alone right now or is someone with you?        2 son's home at present time 12. PREGNANCY: Is there any chance you are pregnant? When was your last menstrual period?       Na   Nurse had pt get son in the room and then nurse explained that pt needs to go to ED asap by son or by 911.  Son agreed to take pt now.  Protocols used: Diabetes - Low Blood Sugar-A-AH

## 2023-11-29 NOTE — ED Notes (Signed)
 Discharge instructions, medications, and follow up care reviewed with and provided to pt. Pt denies any further questions, and has verbalized understanding.

## 2023-11-30 ENCOUNTER — Ambulatory Visit: Admitting: Family Medicine

## 2023-12-03 ENCOUNTER — Other Ambulatory Visit: Payer: Self-pay

## 2023-12-03 DIAGNOSIS — I10 Essential (primary) hypertension: Secondary | ICD-10-CM

## 2023-12-03 MED ORDER — LISINOPRIL 20 MG PO TABS: 20.0000 mg | ORAL_TABLET | Freq: Every day | ORAL | 1 refills | Status: AC

## 2023-12-04 ENCOUNTER — Encounter: Payer: Self-pay | Admitting: Family Medicine

## 2023-12-04 ENCOUNTER — Ambulatory Visit: Admitting: Family Medicine

## 2023-12-04 VITALS — BP 128/72 | HR 85 | Temp 97.4°F | Ht 64.0 in | Wt 316.0 lb

## 2023-12-04 DIAGNOSIS — F419 Anxiety disorder, unspecified: Secondary | ICD-10-CM

## 2023-12-04 DIAGNOSIS — Z634 Disappearance and death of family member: Secondary | ICD-10-CM | POA: Diagnosis not present

## 2023-12-04 DIAGNOSIS — F418 Other specified anxiety disorders: Secondary | ICD-10-CM

## 2023-12-04 MED ORDER — CITALOPRAM HYDROBROMIDE 20 MG PO TABS
20.0000 mg | ORAL_TABLET | Freq: Every day | ORAL | 1 refills | Status: AC
Start: 2023-12-04 — End: ?

## 2023-12-04 MED ORDER — BUSPIRONE HCL 15 MG PO TABS
ORAL_TABLET | ORAL | Status: DC
Start: 2023-12-04 — End: 2023-12-19

## 2023-12-04 MED ORDER — CITALOPRAM HYDROBROMIDE 40 MG PO TABS
40.0000 mg | ORAL_TABLET | Freq: Every day | ORAL | 0 refills | Status: AC
Start: 2023-12-04 — End: ?

## 2023-12-04 NOTE — Progress Notes (Signed)
 Established Patient Office Visit   Subjective:  Patient ID: Melanie [name], female    DOB: 26-Dec-1975  Age: 48 y.o. MRN: [id number]  Chief Complaint  Patient presents with   Anxiety   Tremors    HPI Patient has many concerns today and has not been seen in over a year.  Patient is most concerned about her anxiety. She has lost both parents this year, mother passing was expected-January; and father passed away in August 31, 2023 from a rare cancer. She reports she is having more anxiety and frequent panic attacks. She reports her triggers for panic attacks have changed. Previously she was aware of her triggers, but now uncertain of the triggers.   She is currently taking Bupropion  SR 150mg  BID, Citalopram  60mg  daily (20mg  and 40mg  tablet)  and Buspirone  15 mg BID. Has an old prescription from previous PCP for Lorazepam  1mg  BID PRN for anxiety. She reports she has took it more frequently in the last few months, but medication does cause severe drowsiness. Not in counseling.   ROS See HPI above     Objective:   BP 128/72   Pulse 85   Temp (!) 97.4 F (36.3 C) (Oral)   Ht 5' 4 (1.626 m)   Wt (!) 316 lb (143.3 kg)   SpO2 96%   BMI 54.24 kg/m    Physical Exam Vitals reviewed.  Constitutional:      General: She is not in acute distress.    Appearance: Normal appearance. She is obese. She is not ill-appearing, toxic-appearing or diaphoretic.  HENT:     Head: Normocephalic and atraumatic.  Eyes:     General:        Right eye: No discharge.        Left eye: No discharge.     Conjunctiva/sclera: Conjunctivae normal.  Cardiovascular:     Rate and Rhythm: Normal rate and regular rhythm.     Heart sounds: Normal heart sounds. No murmur heard.    No friction rub. No gallop.  Pulmonary:     Effort: Pulmonary effort is normal. No respiratory distress.     Breath sounds: Normal breath sounds.  Musculoskeletal:        General: Normal range of motion.  Skin:    General: Skin is warm and dry.   Neurological:     General: No focal deficit present.     Mental Status: She is alert and oriented to person, place, and time. Mental status is at baseline.  Psychiatric:        Mood and Affect: Mood is anxious and depressed. Affect is tearful.        Behavior: Behavior normal.        Thought Content: Thought content normal.        Judgment: Judgment normal.      Assessment & Plan:  Anxiety -     Ambulatory referral to Psychology -     busPIRone  HCl; Take 20mg  daily. -     Citalopram  Hydrobromide; Take 1 tablet (40 mg total) by mouth daily.  Dispense: 90 tablet; Refill: 0 -     Citalopram  Hydrobromide; Take 1 tablet (20 mg total) by mouth daily.  Dispense: 90 tablet; Refill: 1  Depression with anxiety -     Ambulatory referral to Psychology -     Citalopram  Hydrobromide; Take 1 tablet (40 mg total) by mouth daily.  Dispense: 90 tablet; Refill: 0 -     Citalopram  Hydrobromide; Take 1 tablet (20 mg  total) by mouth daily.  Dispense: 90 tablet; Refill: 1  Death of parent -     Ambulatory referral to Psychology  -Discussed about anxiety and depression today since this was most concerning to her. Patient scored 8 on PHQ-9 and 16 on GAD-7.  -Continue Bupropion  SR (Wellbutrin ) 150mg  twice a day and Citalopram  60mg  daily. Refilled Citalopram  20mg  tablet and 40mg  tablet to equal 60mg  daily. INCREASED Buspirone  (Buspar ) to 20mg  twice a day instead of 15mg  daily. Advised she may cut 15mg  tablet into 1/3 to take one whole  tablet and take 1/3 to equal 20mg .  -Placed a referral to psychology for counseling.  -Blood glucose obtained and was the same on office meter and her personal meter.  -Follow up in 2 weeks. Please be fasting. Will obtain labs and discuss further about other concerns.  Return in about 2 weeks (around 12/18/2023) for follow-up.   Rodgerick Gilliand, NP

## 2023-12-04 NOTE — Patient Instructions (Addendum)
-  It was good to see you today! -Discussed about anxiety and depression today since this was most concerning to you.  -Continue Bupropion  SR (Wellbutrin ) 150mg  twice a day and Citalopram  60mg  daily. Refilled Citalopram  20mg  tablet and 40mg  tablet to equal 60mg  daily. INCREASED Buspirone  (Buspar ) to 20mg  twice a day instead of 15mg  daily. You may cut 15mg  tablet into 1/3 to take one whole tablet and take 1/3 to equal 20mg .  -Placed a referral to psychology for counseling.  -Blood glucose obtained and was the same on our meter and your meter.  -Follow up in 2 weeks. Please be fasting. Will obtain labs and discuss further about other concerns.

## 2023-12-17 ENCOUNTER — Telehealth: Payer: Self-pay

## 2023-12-17 NOTE — Telephone Encounter (Signed)
 Pharmacy Patient Advocate Encounter   Received notification from CoverMyMeds that prior authorization for Ubrelvy  100mg  Tablet is due for renewal.   Insurance verification completed.   The patient is insured through [facility].  Action: Patient hasn't been seen in your office in over a year. Plan requires updated chart notes for PA renewal.

## 2023-12-19 ENCOUNTER — Encounter: Payer: Self-pay | Admitting: Family Medicine

## 2023-12-19 ENCOUNTER — Ambulatory Visit: Admitting: Family Medicine

## 2023-12-19 ENCOUNTER — Ambulatory Visit: Payer: Self-pay | Admitting: Family Medicine

## 2023-12-19 VITALS — BP 130/76 | HR 70 | Temp 98.0°F | Ht 64.0 in | Wt 322.0 lb

## 2023-12-19 DIAGNOSIS — Z23 Encounter for immunization: Secondary | ICD-10-CM | POA: Diagnosis not present

## 2023-12-19 DIAGNOSIS — F418 Other specified anxiety disorders: Secondary | ICD-10-CM

## 2023-12-19 DIAGNOSIS — F419 Anxiety disorder, unspecified: Secondary | ICD-10-CM | POA: Diagnosis not present

## 2023-12-19 DIAGNOSIS — R5383 Other fatigue: Secondary | ICD-10-CM

## 2023-12-19 DIAGNOSIS — E559 Vitamin D deficiency, unspecified: Secondary | ICD-10-CM

## 2023-12-19 DIAGNOSIS — E66813 Obesity, class 3: Secondary | ICD-10-CM | POA: Diagnosis not present

## 2023-12-19 DIAGNOSIS — J3089 Other allergic rhinitis: Secondary | ICD-10-CM

## 2023-12-19 DIAGNOSIS — E039 Hypothyroidism, unspecified: Secondary | ICD-10-CM | POA: Diagnosis not present

## 2023-12-19 DIAGNOSIS — E785 Hyperlipidemia, unspecified: Secondary | ICD-10-CM

## 2023-12-19 DIAGNOSIS — I10 Essential (primary) hypertension: Secondary | ICD-10-CM

## 2023-12-19 DIAGNOSIS — Z6841 Body Mass Index (BMI) 40.0 and over, adult: Secondary | ICD-10-CM

## 2023-12-19 LAB — CBC WITH DIFFERENTIAL/PLATELET
Basophils Absolute: 0 K/uL (ref 0.0–0.1)
Basophils Relative: 0.5 % (ref 0.0–3.0)
Eosinophils Absolute: 0.2 K/uL (ref 0.0–0.7)
Eosinophils Relative: 4.6 % (ref 0.0–5.0)
HCT: 45.6 % (ref 36.0–46.0)
Hemoglobin: 15.1 g/dL — ABNORMAL HIGH (ref 12.0–15.0)
Lymphocytes Relative: 33.3 % (ref 12.0–46.0)
Lymphs Abs: 1.8 K/uL (ref 0.7–4.0)
MCHC: 33.2 g/dL (ref 30.0–36.0)
MCV: 91.8 fl (ref 78.0–100.0)
Monocytes Absolute: 0.3 K/uL (ref 0.1–1.0)
Monocytes Relative: 4.7 % (ref 3.0–12.0)
Neutro Abs: 3 K/uL (ref 1.4–7.7)
Neutrophils Relative %: 56.9 % (ref 43.0–77.0)
Platelets: 190 K/uL (ref 150.0–400.0)
RBC: 4.97 Mil/uL (ref 3.87–5.11)
RDW: 13.3 % (ref 11.5–15.5)
WBC: 5.3 K/uL (ref 4.0–10.5)

## 2023-12-19 LAB — COMPREHENSIVE METABOLIC PANEL WITH GFR
ALT: 30 U/L (ref 0–35)
AST: 26 U/L (ref 0–37)
Albumin: 3.9 g/dL (ref 3.5–5.2)
Alkaline Phosphatase: 96 U/L (ref 39–117)
BUN: 17 mg/dL (ref 6–23)
CO2: 24 meq/L (ref 19–32)
Calcium: 9.8 mg/dL (ref 8.4–10.5)
Chloride: 110 meq/L (ref 96–112)
Creatinine, Ser: 1.38 mg/dL — ABNORMAL HIGH (ref 0.40–1.20)
GFR: 44.86 mL/min — ABNORMAL LOW (ref >60.00)
Glucose, Bld: 106 mg/dL — ABNORMAL HIGH (ref 70–99)
Potassium: 4.1 meq/L (ref 3.5–5.1)
Sodium: 141 meq/L (ref 135–145)
Total Bilirubin: 0.7 mg/dL (ref 0.2–1.2)
Total Protein: 6.1 g/dL (ref 6.0–8.3)

## 2023-12-19 LAB — LIPID PANEL
Cholesterol: 382 mg/dL — ABNORMAL HIGH (ref 0–200)
HDL: 61.6 mg/dL (ref >39.00)
LDL Cholesterol: 285 mg/dL — ABNORMAL HIGH (ref 0–99)
NonHDL: 320.36
Total CHOL/HDL Ratio: 6
Triglycerides: 177 mg/dL — ABNORMAL HIGH (ref 0.0–149.0)
VLDL: 35.4 mg/dL (ref 0.0–40.0)

## 2023-12-19 LAB — HEMOGLOBIN A1C: Hgb A1c MFr Bld: 5.5 % (ref 4.6–6.5)

## 2023-12-19 LAB — TSH: TSH: 4.61 u[IU]/mL (ref 0.35–5.50)

## 2023-12-19 LAB — VITAMIN B12: Vitamin B-12: 953 pg/mL — ABNORMAL HIGH (ref 211–911)

## 2023-12-19 LAB — VITAMIN D 25 HYDROXY (VIT D DEFICIENCY, FRACTURES): VITD: 21.84 ng/mL — ABNORMAL LOW (ref 30.00–100.00)

## 2023-12-19 MED ORDER — ROSUVASTATIN CALCIUM 10 MG PO TABS: 10.0000 mg | ORAL_TABLET | Freq: Every day | ORAL | 3 refills | Status: AC

## 2023-12-19 MED ORDER — BUSPIRONE HCL 15 MG PO TABS: ORAL_TABLET | ORAL | Status: DC

## 2023-12-19 MED ORDER — LORAZEPAM 0.5 MG PO TABS: 0.5000 mg | ORAL_TABLET | Freq: Two times a day (BID) | ORAL | 0 refills | Status: AC | PRN

## 2023-12-19 MED ORDER — BUSPIRONE HCL 10 MG PO TABS: 10.0000 mg | ORAL_TABLET | Freq: Two times a day (BID) | ORAL | 0 refills | Status: DC

## 2023-12-19 MED ORDER — VITAMIN D3 1.25 MG (50000 UT) PO CAPS: 1.2500 mg | ORAL_CAPSULE | ORAL | 0 refills | Status: DC

## 2023-12-19 MED ORDER — FLUTICASONE PROPIONATE 50 MCG/ACT NA SUSP: 2.0000 | NASAL | 1 refills | Status: AC | PRN

## 2023-12-19 MED ORDER — MONTELUKAST SODIUM 10 MG PO TABS: 10.0000 mg | ORAL_TABLET | Freq: Every day | ORAL | 0 refills | Status: DC

## 2023-12-19 NOTE — Assessment & Plan Note (Signed)
 Stable. Continue with Levothyroxine  88mcg daily. Ordered TSH.

## 2023-12-19 NOTE — Assessment & Plan Note (Signed)
 Stable. Continue Lisinopril  20mg  daily. Ordered CMP.

## 2023-12-19 NOTE — Patient Instructions (Addendum)
-  It was good to see you back today.  -Continue Bupropion  SR (Wellbutrin ) 150mg  twice a day and Citalopram  60mg  daily. INCREASED Buspirone  (Buspar ) to 25mg  twice a day instead of 20mg  daily. Advised she may take a whole 15mg  tablet and prescribed 10mg  tablet to take both to equal 25mg  twice a day.  -Urine drug screen ordered for management of Lorazepam  and will need to be collected every 6 months. Also, signed controlled substance agreement that will need to be renewed yearly. Changed the dose of Lorazepam  to 0.5mg  tablet twice a day as needed instead of 1mg  since it was causing more drowsiness.  -Continue all other medications. Refilled Fluticasone  nasal spray and Montelukast .  -Ordered labs. Office will call with results and will be available via MyChart.  -Influenza vaccine provided today.  -Follow up in 1 month to see how you are doing with anxiety and depression.

## 2023-12-19 NOTE — Progress Notes (Signed)
 Established Patient Office Visit   Subjective:  Patient ID: Melanie [name], female    DOB: [birth year]  Age: 48 y.o. MRN: [id number]  Chief Complaint  Patient presents with   Medical Management of Chronic Issues    2 week follow up     HPI Anxiety and Depression: Increased Buspirone  to 20mg  BID from 15mg  BID while continuing Bupropion  SR 150mg  BID and Citalopram  40mg  daily at last appointment. Has an appointment schedule with counseling on 11/05. She reports the adjustment in medication is helping a little more. Patient has previously been prescribed Lorazepam  1mg  BID for severe anxiety with a panic attack by previous provider back in 2023. Usually takes it once every 2 months, but took it more often this year with the loss of her parents. Also, previously 1 mg caused her to be too drowsy.    Allergic Rhinitis: Chronic. Patient takes Flonase  nasal spray, 2 sprays as needed instead of daily and Montelukast  10mg  daily.   Hypothyroidism: Chronic. Patient is taking Levothyroxine  88mcg daily. Lab Results  Component Value Date   TSH 4.61 12/19/2023   HTN: Chronic. Patient is taking Lisinopril  20mg  daily. Patient reports she does not monitor her blood pressure at home. Denies CP, SHOB, dizziness, or lower extremity edema. Always has headaches, daily-had for years since a teenager. BP Readings from Last 3 Encounters:  12/19/23 130/76  12/04/23 128/72  11/29/23 (!) 151/75    ROS See HPI above     Objective:   BP 130/76   Pulse 70   Temp 98 F (36.7 C) (Oral)   Ht 5' 4 (1.626 m)   Wt (!) 322 lb (146.1 kg)   SpO2 95%   BMI 55.27 kg/m    Physical Exam Vitals reviewed.  Constitutional:      General: She is not in acute distress.    Appearance: Normal appearance. She is obese. She is not ill-appearing, toxic-appearing or diaphoretic.  HENT:     Head: Normocephalic and atraumatic.  Eyes:     General:        Right eye: No discharge.        Left eye: No discharge.      Conjunctiva/sclera: Conjunctivae normal.  Cardiovascular:     Rate and Rhythm: Normal rate and regular rhythm.     Heart sounds: Normal heart sounds. No murmur heard.    No friction rub. No gallop.  Pulmonary:     Effort: Pulmonary effort is normal. No respiratory distress.     Breath sounds: Normal breath sounds.  Musculoskeletal:        General: Normal range of motion.  Skin:    General: Skin is warm and dry.  Neurological:     General: No focal deficit present.     Mental Status: She is alert and oriented to person, place, and time. Mental status is at baseline.  Psychiatric:        Mood and Affect: Mood normal.        Behavior: Behavior normal.        Thought Content: Thought content normal.        Judgment: Judgment normal.     Comments: Smiled some during the visit.     Results for orders placed or performed in visit on 12/19/23  CBC with Differential/Platelet  Result Value Ref Range   WBC 5.3 4.0 - 10.5 K/uL   RBC 4.97 3.87 - 5.11 Mil/uL   Hemoglobin 15.1 (H) 12.0 - 15.0 g/dL   HCT  45.6 36.0 - 46.0 %   MCV 91.8 78.0 - 100.0 fl   MCHC 33.2 30.0 - 36.0 g/dL   RDW 86.6 88.4 - 84.4 %   Platelets 190.0 150.0 - 400.0 K/uL   Neutrophils Relative % 56.9 43.0 - 77.0 %   Lymphocytes Relative 33.3 12.0 - 46.0 %   Monocytes Relative 4.7 3.0 - 12.0 %   Eosinophils Relative 4.6 0.0 - 5.0 %   Basophils Relative 0.5 0.0 - 3.0 %   Neutro Abs 3.0 1.4 - 7.7 K/uL   Lymphs Abs 1.8 0.7 - 4.0 K/uL   Monocytes Absolute 0.3 0.1 - 1.0 K/uL   Eosinophils Absolute 0.2 0.0 - 0.7 K/uL   Basophils Absolute 0.0 0.0 - 0.1 K/uL  Comprehensive metabolic panel with GFR  Result Value Ref Range   Sodium 141 135 - 145 mEq/L   Potassium 4.1 3.5 - 5.1 mEq/L   Chloride 110 96 - 112 mEq/L   CO2 24 19 - 32 mEq/L   Glucose, Bld 106 (H) 70 - 99 mg/dL   BUN 17 6 - 23 mg/dL   Creatinine, Ser 8.61 (H) 0.40 - 1.20 mg/dL   Total Bilirubin 0.7 0.2 - 1.2 mg/dL   Alkaline Phosphatase 96 39 - 117 U/L   AST  26 0 - 37 U/L   ALT 30 0 - 35 U/L   Total Protein 6.1 6.0 - 8.3 g/dL   Albumin 3.9 3.5 - 5.2 g/dL   GFR 55.13 (L) >39.99 mL/min   Calcium  9.8 8.4 - 10.5 mg/dL  Hemoglobin J8r  Result Value Ref Range   Hgb A1c MFr Bld 5.5 4.6 - 6.5 %  Lipid panel  Result Value Ref Range   Cholesterol 382 (H) 0 - 200 mg/dL   Triglycerides 822.9 (H) 0.0 - 149.0 mg/dL   HDL 38.39 >60.99 mg/dL   VLDL 64.5 0.0 - 59.9 mg/dL   LDL Cholesterol 714 (H) 0 - 99 mg/dL   Total CHOL/HDL Ratio 6    NonHDL 320.36   TSH  Result Value Ref Range   TSH 4.61 0.35 - 5.50 uIU/mL  VITAMIN D  25 Hydroxy (Vit-D Deficiency, Fractures)  Result Value Ref Range   VITD 21.84 (L) 30.00 - 100.00 ng/mL  Vitamin B12  Result Value Ref Range   Vitamin B-12 953 (H) 211 - 911 pg/mL    The ASCVD Risk score (Arnett DK, et al., 2019) failed to calculate for the following reasons:   The valid total cholesterol range is 130 to 320 mg/dL    Assessment & Plan:  Anxiety Assessment & Plan: -Patient scored 8 on PHQ-9 and 16 on GAD-7, which was the same as previously. However, patient reports she can tell a little difference in the right direction. Continue Bupropion  SR (Wellbutrin ) 150mg  twice a day and Citalopram  60mg  daily. INCREASED Buspirone  (Buspar ) to 25mg  twice a day instead of 20mg  daily. Advised she may take a whole 15mg  tablet and prescribed 10mg  tablet to take both to equal 25mg  twice a day.  -Urine drug screen ordered for management of Lorazepam  and will need to be collected every 6 months. Also, signed controlled substance agreement that will need to be renewed yearly. Changed the dose of Lorazepam  to 0.5mg  tablet twice a day as needed instead of 1mg  since it was causing more drowsiness.   Orders: -     busPIRone  HCl; Take one tablet twice a day -     busPIRone  HCl; Take 1 tablet (10 mg total) by mouth  2 (two) times daily.  Dispense: 180 tablet; Refill: 0 -     LORazepam ; Take 1 tablet (0.5 mg total) by mouth 2 (two) times daily  as needed for anxiety.  Dispense: 30 tablet; Refill: 0 -     DRUG MONITOR, PANEL 1, SCREEN, URINE  Depression with anxiety Assessment & Plan: -Patient scored 8 on PHQ-9 and 16 on GAD-7, which was the same as previously. However, patient reports she can tell a little difference in the right direction. Continue Bupropion  SR (Wellbutrin ) 150mg  twice a day and Citalopram  60mg  daily. INCREASED Buspirone  (Buspar ) to 25mg  twice a day instead of 20mg  daily. Advised she may take a whole 15mg  tablet and prescribed 10mg  tablet to take both to equal 25mg  twice a day.  -Urine drug screen ordered for management of Lorazepam  and will need to be collected every 6 months. Also, signed controlled substance agreement that will need to be renewed yearly. Changed the dose of Lorazepam  to 0.5mg  tablet twice a day as needed instead of 1mg  since it was causing more drowsiness.   Orders: -     busPIRone  HCl; Take one tablet twice a day -     busPIRone  HCl; Take 1 tablet (10 mg total) by mouth 2 (two) times daily.  Dispense: 180 tablet; Refill: 0  Perennial allergic rhinitis Assessment & Plan: Stable. Continue with Cetirizine 10mg  daily, Fluticasone  nasal spray, and Montelukast  10mg  daily. Refilled Fluticasone  nasal spray and Montelukast .     Orders: -     Fluticasone  Propionate; Place 2 sprays into both nostrils as needed.  Dispense: 18.2 mL; Refill: 1 -     Montelukast  Sodium; Take 1 tablet (10 mg total) by mouth at bedtime.  Dispense: 90 tablet; Refill: 0  Hypothyroidism, unspecified type Assessment & Plan: Stable. Continue with Levothyroxine  88mcg daily. Ordered TSH.   Orders: -     TSH  Hypertension, unspecified type Assessment & Plan: Stable. Continue Lisinopril  20mg  daily. Ordered CMP.   Orders: -     Comprehensive metabolic panel with GFR  Fatigue, unspecified type -     CBC with Differential/Platelet -     TSH -     VITAMIN D  25 Hydroxy (Vit-D Deficiency, Fractures) -     Vitamin B12  Class 3  severe obesity due to excess calories without serious comorbidity with body mass index (BMI) of 50.0 to 59.9 in adult (HCC) -     CBC with Differential/Platelet -     Comprehensive metabolic panel with GFR -     Hemoglobin A1c -     Lipid panel -     TSH  Immunization due -     Flu vaccine trivalent PF, 6mos and older(Flulaval,Afluria,Fluarix,Fluzone)  1.Ordered CBC, CMP, TSH, Vitamin B12/D, and lipid panel based on fatigue and BMI-obesity.  2.Influenza vaccine provided.  Return in about 1 month (around 01/19/2024) for follow-up.   Delta Pichon, NP

## 2023-12-19 NOTE — Assessment & Plan Note (Signed)
-  Patient scored 8 on PHQ-9 and 16 on GAD-7, which was the same as previously. However, patient reports she can tell a little difference in the right direction. Continue Bupropion  SR (Wellbutrin ) 150mg  twice a day and Citalopram  60mg  daily. INCREASED Buspirone  (Buspar ) to 25mg  twice a day instead of 20mg  daily. Advised she may take a whole 15mg  tablet and prescribed 10mg  tablet to take both to equal 25mg  twice a day.  -Urine drug screen ordered for management of Lorazepam  and will need to be collected every 6 months. Also, signed controlled substance agreement that will need to be renewed yearly. Changed the dose of Lorazepam  to 0.5mg  tablet twice a day as needed instead of 1mg  since it was causing more drowsiness.

## 2023-12-19 NOTE — Assessment & Plan Note (Signed)
 Stable. Continue with Cetirizine 10mg  daily, Fluticasone  nasal spray, and Montelukast  10mg  daily. Refilled Fluticasone  nasal spray and Montelukast .

## 2023-12-20 LAB — DRUG MONITOR, PANEL 1, SCREEN, URINE
Amphetamines: NEGATIVE ng/mL (ref <500)
Barbiturates: NEGATIVE ng/mL (ref <300)
Benzodiazepines: NEGATIVE ng/mL (ref <100)
Cocaine Metabolite: NEGATIVE ng/mL (ref <150)
Creatinine: 125.2 mg/dL (ref >=20.0)
Marijuana Metabolite: NEGATIVE ng/mL (ref <20)
Methadone Metabolite: NEGATIVE ng/mL (ref <100)
Opiates: NEGATIVE ng/mL (ref <100)
Oxidant: NEGATIVE ug/mL (ref <200)
Oxycodone: NEGATIVE ng/mL (ref <100)
Phencyclidine: NEGATIVE ng/mL (ref <25)
pH: 6.3 (ref 4.5–9.0)

## 2023-12-20 LAB — DM TEMPLATE

## 2023-12-31 ENCOUNTER — Other Ambulatory Visit: Payer: Self-pay

## 2024-01-01 ENCOUNTER — Other Ambulatory Visit: Payer: Self-pay | Admitting: Adult Health

## 2024-01-01 DIAGNOSIS — G43709 Chronic migraine without aura, not intractable, without status migrainosus: Secondary | ICD-10-CM

## 2024-01-08 ENCOUNTER — Encounter (HOSPITAL_COMMUNITY): Payer: Self-pay | Admitting: *Deleted

## 2024-01-08 ENCOUNTER — Other Ambulatory Visit: Payer: Self-pay

## 2024-01-08 DIAGNOSIS — J3089 Other allergic rhinitis: Secondary | ICD-10-CM

## 2024-01-08 MED ORDER — MONTELUKAST SODIUM 10 MG PO TABS: 10.0000 mg | ORAL_TABLET | Freq: Every day | ORAL | 0 refills | Status: DC

## 2024-01-12 DIAGNOSIS — Z87442 Personal history of urinary calculi: Secondary | ICD-10-CM

## 2024-01-12 HISTORY — DX: Personal history of urinary calculi: Z87.442

## 2024-01-16 ENCOUNTER — Ambulatory Visit: Admitting: Clinical

## 2024-01-16 DIAGNOSIS — F4323 Adjustment disorder with mixed anxiety and depressed mood: Secondary | ICD-10-CM

## 2024-01-16 NOTE — Progress Notes (Unsigned)
 [facility] Counselor Initial Adult In-Person - Comprehensive Clinical Assessment  Name: Melanie [name] Date: 01/16/2024 MRN: [id number] DOB: June 20, 1975 PCP: Billy Philippe SAUNDERS, NP  Session Time start: 4:10pm End time: 5pm Total time: 50 min  Types of Service: Comprehensive Clinical Assessment (CCA)  Guardian/Payee:  Self  Paperwork requested: No   Melanie [name] participated from office with therapist and consented to treatment. We reviewed the limits of confidentiality prior to the start of the evaluation. Melanie [name] expressed understanding and agreed to proceed.   Reason for referral in patient/family's own words:  Grieving the loss of her parents and managing family stressors  Client likes to be called Melanie.  Primary language at home is English.   Risk Assessment: Danger to Self:  No Self-injurious Behavior: No Danger to Others: No Duty to Warn:no Physical Aggression / Violence:No  Access to Firearms a concern: Not asked  Client and/or legal guardian was educated about steps to take if suicide or homicide risk level increases between visits: n/a While future psychiatric events cannot be accurately predicted, the patient does not currently require acute inpatient psychiatric care and does not currently meet [place]  involuntary commitment criteria.  Current Stressors:  Grief/losses and Work [employer] Manager - took care of father on hospice due to rare brain cancer.  Mother died 01/24/25Father died 2023-08-04. 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 and written up/blamed for things  Client and/or Family's Strengths/Protective Factors: Able to Communicate Effectively Intelligent, good sense of humour, able to find positives in things  Current Health Habits: Sleep:   Bedtime 9pm-11pm, sleep between  10/10:30pm, get up 6am, sleep throughout the night typically  Physical Activities: Stretching, has bad knees and arthritis - going on walks become more painful  Eating/Appetite: Good appetite Eats breakfast, lunch, dinner - fast food, don't snack Last few years - taste has changed since bariatric surgery, husband cooks well but hasn't been appealing, getting protein Drinking water  Current Medications and therapies:  Psychotropic medications: Current medications: Taking buPropion  Buspar , Celexa   Client taking medications as prescribed:  Yes Side effects reported: No  Therapies:  In the past behavioral therapy, agency unknown  Psychiatric Review of systems: Insomnia: Comes & goes Changes in appetite: Since bariatric surgery - changes in appetite Decreased need for sleep: No Hallucinations: No   Paranoia: No    Psychiatric History: Past psychiatry diagnosis: Anxiety disorder her life, started medications for it 18-19 years ago, after she got married, Depression related to events Patient currently being seen by therapist/psychiatrist:  No Prior Suicide Attempts: No Past psychiatry Hospitalization(s): No Past history of violence: No  Social History:  Living situation: Husband, 4 sons, 7 cats and fish Relationship status: Married Number of Children if applicable: 5 adopted children  Older 2 are sibling group (8 years) Younger 3 are sibling group (10 years) 63 yo son (anxious - difficulty sleeping) 56 yo son (tried to kill himself) 35 yo son (vasculitis) 62 yo son 104 yo daughter - lives in group home  Employment: Scientist, [employer] history: Yes Legal History/Concerns: Yes Religious/spiritual beliefs or involvement: Christian - Member of [employer] - hard to go to church since her father died - Anxiey attacks in main chapel for the last year, anxiety atttacks worsened  since father died. Haven't gone for  awhile. One of her goals is to go back to church  since she doesn't like that she can't go to church due to anxiety attacks.  Any cultural differences that may affect / interfere with treatment:  not applicable   Current or History of Alcohol/Substance use: Does client seem concerned about dependence or abuse of any substance? no  Traumatic Experiences/Abuse history: Have you ever been exposed, witnessed or experienced any form of abuse, what type?  Need additional information  Have you ever been exposed, witnessed or experienced something traumatic, describe?  Need additional information  Family of Origin (Childhood History)  Lived in [place]    Family History  Problem Relation Age of Onset   Colon polyps Mother    Colon cancer Mother 88   Diabetes Mother    Liver disease Mother        ESLD- unknown etiology   Obesity Mother    Kidney disease Mother    Hyperlipidemia Father    Melanoma Father    Colon polyps Sister    Migraines Sister    Sleep apnea Sister    Bipolar disorder Brother    COPD Paternal Aunt    COPD Paternal Aunt    Diabetes Maternal Grandmother    Stroke Maternal Grandfather    Diabetes Paternal Grandmother    Breast cancer Paternal Grandmother    Crohn's disease Neg Hx    Esophageal cancer Neg Hx    Stomach cancer Neg Hx    Rectal cancer Neg Hx    Ulcerative colitis Neg Hx      Client Medical history  Medical History/Surgical History: reviewed' 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    Migraines    LIGHT SENSITIVITY , NOW ON IMITREX  SINCE 08-02-2018,NOTICES IMPROVEMENT    Multiple food allergies    Muscle pain    Numbness and tingling in both hands     Pre-diabetes    Seizures (HCC)    at age 78 fever seizure only had one single seizure   Shingles    dx 11/23/22,completed antiviral   Shortness of breath    was due to a medication side effect, now off medication , SOB resolved    Sleep apnea    CPAP   Stress    Swelling of both lower extremities     Past Surgical History:  Procedure Laterality Date   ELBOW LIGAMENT RECONSTRUCTION Right 08/21/2018   Procedure: LATERAL COLLATERAL LIGAMENT REPAIR;  Surgeon: Cristy Bonner DASEN, MD;  Location: [facility];  Service: Orthopedics;  Laterality: Right;   FINGER SURGERY Left     index finger   KNEE SURGERY Left    LAPAROSCOPIC GASTRIC SLEEVE RESECTION N/A 05/31/2020   Procedure: LAPAROSCOPIC GASTRIC SLEEVE RESECTION;  Surgeon: Stevie, Herlene Righter, MD;  Location: [facility];  Service: General;  Laterality: N/A;  2 HOURS TOTAL ROOM 1   LIVER BIOPSY N/A 05/15/2014   Procedure: LIVER BIOPSY;  Surgeon: Lamar JONETTA Aho, MD;  Location: [facility];  Service: Endoscopy;  Laterality: N/A;  ultrasound to mark the liver   RENAL BIOPSY     20 years ago,updated 12/06/22   TENNIS ELBOW RELEASE/NIRSCHEL PROCEDURE Right 08/21/2018   Procedure: TENNIS ELBOW RELEASE/NIRSCHEL PROCEDURE;  Surgeon: Cristy Bonner DASEN, MD;  Location: [facility];  Service: Orthopedics;  Laterality: Right;   TONSILLECTOMY AND ADENOIDECTOMY  1978   TYMPANOSTOMY TUBE PLACEMENT     age 22   ULNAR NERVE TRANSPOSITION Right 08/21/2018   Procedure: ULNAR NERVE DECOMPRESSION/TRANSPOSITION;  Surgeon: Cristy Bonner DASEN, MD;  Location: [facility];  Service: Orthopedics;  Laterality: Right;   UPPER GI ENDOSCOPY N/A 05/31/2020   Procedure: UPPER GI ENDOSCOPY;  Surgeon: Stevie, Herlene Righter, MD;  Location: [facility];  Service: General;  Laterality: N/A;   wrist cyst Left     Medications: Current Outpatient Medications  Medication Sig Dispense Refill   buPROPion  (WELLBUTRIN  SR) 150 MG 12 hr tablet Take 1 tablet (150 mg total) by mouth 2 (two) times daily. 180 tablet 1    busPIRone  (BUSPAR ) 10 MG tablet Take 1 tablet (10 mg total) by mouth 2 (two) times daily. 180 tablet 0   busPIRone  (BUSPAR ) 15 MG tablet Take one tablet twice a day     Calcium  Carbonate (CALCIUM  500 PO) Take 1 tablet by mouth daily.     cetirizine (ZYRTEC) 10 MG tablet Take 10 mg by mouth at bedtime.      Cholecalciferol (VITAMIN D3) 1.25 MG (50000 UT) CAPS Take 1 capsule (1.25 mg total) by mouth every 7 (seven) days. 12 capsule 0   citalopram  (CELEXA ) 20 MG tablet Take 1 tablet (20 mg total) by mouth daily. 90 tablet 1   citalopram  (CELEXA ) 40 MG tablet Take 1 tablet (40 mg total) by mouth daily. 90 tablet 0   Evolocumab  (REPATHA  SURECLICK) 140 MG/ML SOAJ Inject 1 pen every 14 days, must schedule OV for further refills. 6 mL 0   FARXIGA 10 MG TABS tablet Take 10 mg by mouth daily.     Fish Oil-Cholecalciferol (OMEGA-3 + VITAMIN D3 PO) Take 1 tablet by mouth daily. 4,000IU of vitamin D3 115mg  of Omega-3     fluticasone  (FLONASE ) 50 MCG/ACT nasal spray Place 2 sprays into both nostrils as needed. 18.2 mL 1   levothyroxine  (SYNTHROID ) 88 MCG tablet TAKE ONE TABLET BY MOUTH DAILY BEFORE BREAKFAST 90 tablet 1   lisinopril  (ZESTRIL ) 20 MG tablet Take 1 tablet (20 mg total) by mouth daily. 90 tablet 1   LORazepam  (ATIVAN ) 0.5 MG tablet Take 1 tablet (0.5 mg total) by mouth 2 (two) times daily as needed for anxiety. 30 tablet 0   montelukast  (SINGULAIR ) 10 MG tablet Take 1 tablet (10 mg total) by mouth at bedtime. 90 tablet 0   Multiple Vitamin (MULTIVITAMIN WITH MINERALS) TABS tablet Take 1 tablet by mouth daily.     Polyethyl Glycol-Propyl Glycol (LUBRICANT EYE DROPS) 0.4-0.3 % SOLN Place 1 drop into both eyes 3 (three) times daily as needed (dry/irritated eyes).     rosuvastatin (CRESTOR) 10 MG tablet Take 1 tablet (10 mg total) by mouth daily. 90 tablet 3   topiramate  (TOPAMAX ) 100 MG tablet Take 1 tablet (100 mg total) by mouth 2 (two) times daily. *NEEDS APPOINTMENT FOR FURTHER REFILLS* 120 tablet  0   Ubrogepant  (UBRELVY ) 100 MG TABS Take 1 tablet (100 mg total) by mouth as needed. May repeat in 2 hours if needed, no more than 2 pills in 24 hours. Must be seen for further refills. 10 tablet 1   No current  facility-administered medications for this visit.    Allergies  Allergen Reactions   Welchol  [Colesevelam ] Shortness Of Breath   Lidocaine  Other (See Comments)    Flu like sx's,topical   Pistachio Nut (Diagnostic) Itching and Swelling    Swollen tongue/itchy mouth   Relafen [Nabumetone] Other (See Comments)    migraine   Celebrex [Celecoxib] Rash   Sulfa Antibiotics Rash     Interventions: Interventions utilized: This Clinician reviewed information on new patient paperwork, explained services, identified presenting concerns, explored goals and built rapport. Obtained additional information for comprehensive assessment and developed general plan of care.   Client Family Response:  Permelia presented to be engaged and provided information needed for today's visit.  She reported the following goals for herself: Wants to work on going to church and not have anxiety attacks Wants to get through the week without hiding in her room for the day - emotionally spent by the end of the week. Not to fall apart when doing things.  Mental status exam:   General Appearance Siegfried:  Neat Eye Contact:  Good Motor Behavior:  Normal Speech:  Normal Level of Consciousness:  Alert Mood:  Anxious Affect:  Appropriate and Tearful Anxiety Level:  Moderate Thought Process:  Coherent Thought Content:  WNL Perception:  Normal Judgment:  Good Insight:  Present   Clinical Assessment: Ms. Shikita Vaillancourt is a 48 yo female who is experiencing grief with the loss of her parents.  She also reported ongoing anxiety symptoms that is affecting her daily functioning.  Ms. Spagnolo is motivated to engage in psycho therapy and would benefit from enhancing her coping skills through therapeutic  interventions.   Diagnosis: Adjustment disorder with mixed anxiety and depressed mood  Coordination of Care: Written progress or summary reports in medical chart   Recommendations for Services/Supports/Treatments: Outpatient individual psycho therapy   Follow up Plan: A follow-up was scheduled to create a more specific treatment plan and begin treatment. Therapist answered all questions during the evaluation and contact information was provided.   Ladaja Yusupov P. Trudy, MSW, LCSW [employer] Therapist Main Office: [phone]

## 2024-01-18 ENCOUNTER — Ambulatory Visit (INDEPENDENT_AMBULATORY_CARE_PROVIDER_SITE_OTHER): Admitting: Family Medicine

## 2024-01-18 ENCOUNTER — Encounter: Payer: Self-pay | Admitting: Family Medicine

## 2024-01-18 VITALS — BP 122/80 | HR 71 | Temp 97.6°F | Ht 64.0 in | Wt 332.0 lb

## 2024-01-18 DIAGNOSIS — F418 Other specified anxiety disorders: Secondary | ICD-10-CM

## 2024-01-18 DIAGNOSIS — M79642 Pain in left hand: Secondary | ICD-10-CM | POA: Diagnosis not present

## 2024-01-18 DIAGNOSIS — F419 Anxiety disorder, unspecified: Secondary | ICD-10-CM | POA: Diagnosis not present

## 2024-01-18 DIAGNOSIS — M25532 Pain in left wrist: Secondary | ICD-10-CM

## 2024-01-18 DIAGNOSIS — E785 Hyperlipidemia, unspecified: Secondary | ICD-10-CM | POA: Diagnosis not present

## 2024-01-18 NOTE — Assessment & Plan Note (Signed)
-  Patient scored 1 on PHQ-9 which is improvement from 8 and 5 on GAD-7, which is an improvement. Continue Bupropion  SR (Wellbutrin ) 150mg  twice a day and Citalopram  60mg  daily, Buspirone  (Buspar ) to 25mg  twice a day instead of 20mg  daily.  Continue Lorazepam  to 0.5mg  tablet twice a day as needed.

## 2024-01-18 NOTE — Addendum Note (Signed)
 Addended by: Champion Corales R on: 01/18/2024 04:26 PM   Modules accepted: Orders

## 2024-01-18 NOTE — Progress Notes (Signed)
 Established Patient Office Visit   Subjective:  Patient ID: Melanie [name], female    DOB: [birth year]  Age: 48 y.o. MRN: [id number]  Chief Complaint  Patient presents with   Medical Management of Chronic Issues    1 month follow up    HPI Anxiety and Depression: On previous appointment increased Buspirone  to 25mg  BID from 20mg  BID while continuing Bupropion  SR 150mg  BID and Citalopram  60mg  daily. Also, started back on Lorazepam  0.5mg  tablet BID PRN for anxiety attacks. She reports a few weeks ago, she noticed she has not been crying a lot.  Wednesday, she went to her first counseling appointment with Rolin Pouch with Behavioral [facility]. It went well.   Patient is complaining of left lateral wrist pain and hand pain. She reports this started about a month ago. Intermittent pain, but now it is constant. She is wearing a brace on her left wrist. Described as ache, feels really tired, and inner cold feeling. Denies any injury. Denies numbness or tingling. Mild swelling. Not been taking any medication for pain. Today, is probably the worst. Pain radiating to elbow.  ROS See HPI above     Objective:   BP 122/80   Pulse 71   Temp 97.6 F (36.4 C) (Oral)   Ht 5' 4 (1.626 m)   Wt (!) 332 lb (150.6 kg)   SpO2 95%   BMI 56.99 kg/m    Physical Exam Vitals reviewed.  Constitutional:      General: She is not in acute distress.    Appearance: Normal appearance. She is morbidly obese. She is not ill-appearing, toxic-appearing or diaphoretic.  HENT:     Head: Normocephalic and atraumatic.  Eyes:     General:        Right eye: No discharge.        Left eye: No discharge.     Conjunctiva/sclera: Conjunctivae normal.  Cardiovascular:     Rate and Rhythm: Normal rate and regular rhythm.     Heart sounds: Normal heart sounds. No murmur heard.    No friction rub. No gallop.  Pulmonary:     Effort: Pulmonary effort is normal. No respiratory distress.     Breath sounds: Normal  breath sounds.  Musculoskeletal:     Left wrist: Swelling (Mild) present. No deformity or tenderness. Decreased range of motion (Complications with rotating. Able to flex and extend.). Normal pulse.  Skin:    General: Skin is warm and dry.  Neurological:     General: No focal deficit present.     Mental Status: She is alert and oriented to person, place, and time. Mental status is at baseline.  Psychiatric:        Mood and Affect: Mood normal.        Behavior: Behavior normal.        Thought Content: Thought content normal.        Judgment: Judgment normal.      Assessment & Plan:  Left wrist pain -     DG Wrist Complete Left; Future -     Ambulatory referral to Orthopedic Surgery  Left hand pain -     Ambulatory referral to Orthopedic Surgery -     DG Hand Complete Left; Future  Hyperlipidemia, unspecified hyperlipidemia type -     Comprehensive metabolic panel with GFR; Future  Anxiety Assessment & Plan: -Patient scored 1 on PHQ-9 which is improvement from 8 and 5 on GAD-7, which is an improvement. Continue Bupropion  SR (  Wellbutrin ) 150mg  twice a day and Citalopram  60mg  daily, Buspirone  (Buspar ) to 25mg  twice a day instead of 20mg  daily.  Continue Lorazepam  to 0.5mg  tablet twice a day as needed.      Depression with anxiety Assessment & Plan: -Patient scored 1 on PHQ-9 which is improvement from 8 and 5 on GAD-7, which is an improvement. Continue Bupropion  SR (Wellbutrin ) 150mg  twice a day and Citalopram  60mg  daily, Buspirone  (Buspar ) to 25mg  twice a day instead of 20mg  daily.  Continue Lorazepam  to 0.5mg  tablet twice a day as needed.        -As for wrist/hand pain: ordered left hand and wrist x-ray. Office will call with lab results and will be available via MyChart. Can apply warm or cool compresses to the area, 4-6 times a day, up to 20 minutes at a time.  May take Tylenol  1000mg  every 8 hours. Recommend to take either Ibuprofen  600-800mg  every 8 hours with food or  Aleve 500mg  twice a day for only 5 days to help with inflammation and pain. Do not take more than 5 days consistently. Eat when taking Ibuprofen  or Aleve. Placed a referral to orthopedic for left hand and wrist pain.   Return in about 3 months (around 04/19/2024) for chronic management: Lab visit for CMP .   Sherif Millspaugh, NP

## 2024-01-18 NOTE — Patient Instructions (Addendum)
-  It was good to see you today. I am glad that you doing better. -Continue all medications and attending counseling. -As for wrist/hand pain: ordered left hand and wrist x-ray. Office will call with lab results and will be available via MyChart. Can apply warm or cool compresses to the area, 4-6 times a day, up to 20 minutes at a time.  May take Tylenol  1000mg  every 8 hours. Recommend to take either Ibuprofen  600-800mg  every 8 hours with food or Aleve 500mg  twice a day for only 5 days to help with inflammation and pain. Do not take more than 5 days consistently. Eat when taking Ibuprofen  or Aleve. Placed a referral to orthopedic for left hand and wrist pain.  -Follow up in 3 months.

## 2024-01-22 ENCOUNTER — Ambulatory Visit: Admitting: Orthopedic Surgery

## 2024-01-24 ENCOUNTER — Other Ambulatory Visit: Payer: Self-pay

## 2024-01-24 ENCOUNTER — Emergency Department (HOSPITAL_COMMUNITY)

## 2024-01-24 ENCOUNTER — Encounter (HOSPITAL_COMMUNITY): Payer: Self-pay

## 2024-01-24 ENCOUNTER — Emergency Department (HOSPITAL_COMMUNITY)
Admission: EM | Admit: 2024-01-24 | Discharge: 2024-01-24 | Disposition: A | Discharge status: Home / Self Care | Attending: Emergency Medicine | Admitting: Emergency Medicine

## 2024-01-24 DIAGNOSIS — R509 Fever, unspecified: Secondary | ICD-10-CM | POA: Diagnosis not present

## 2024-01-24 DIAGNOSIS — I10 Essential (primary) hypertension: Secondary | ICD-10-CM | POA: Diagnosis not present

## 2024-01-24 DIAGNOSIS — R7989 Other specified abnormal findings of blood chemistry: Secondary | ICD-10-CM | POA: Diagnosis not present

## 2024-01-24 DIAGNOSIS — R109 Unspecified abdominal pain: Secondary | ICD-10-CM | POA: Diagnosis present

## 2024-01-24 DIAGNOSIS — R6883 Chills (without fever): Secondary | ICD-10-CM

## 2024-01-24 LAB — CBC WITH DIFFERENTIAL/PLATELET
Abs Immature Granulocytes: 0.03 K/uL (ref 0.00–0.07)
Basophils Absolute: 0 K/uL (ref 0.0–0.1)
Basophils Relative: 1 %
Eosinophils Absolute: 0.4 K/uL (ref 0.0–0.5)
Eosinophils Relative: 9 %
HCT: 40.5 % (ref 36.0–46.0)
Hemoglobin: 13.8 g/dL (ref 12.0–15.0)
Immature Granulocytes: 1 %
Lymphocytes Relative: 32 %
Lymphs Abs: 1.6 K/uL (ref 0.7–4.0)
MCH: 31.7 pg (ref 26.0–34.0)
MCHC: 34.1 g/dL (ref 30.0–36.0)
MCV: 92.9 fL (ref 80.0–100.0)
Monocytes Absolute: 0.5 K/uL (ref 0.1–1.0)
Monocytes Relative: 10 %
Neutro Abs: 2.3 K/uL (ref 1.7–7.7)
Neutrophils Relative %: 47 %
Platelets: 115 K/uL — ABNORMAL LOW (ref 150–400)
RBC: 4.36 MIL/uL (ref 3.87–5.11)
RDW: 13.6 % (ref 11.5–15.5)
WBC: 4.8 K/uL (ref 4.0–10.5)
nRBC: 0 % (ref 0.0–0.2)

## 2024-01-24 LAB — COMPREHENSIVE METABOLIC PANEL WITH GFR
ALT: 131 U/L — ABNORMAL HIGH (ref 0–44)
AST: 115 U/L — ABNORMAL HIGH (ref 15–41)
Albumin: 3.7 g/dL (ref 3.5–5.0)
Alkaline Phosphatase: 206 U/L — ABNORMAL HIGH (ref 38–126)
Anion gap: 9 (ref 5–15)
BUN: 19 mg/dL (ref 6–20)
CO2: 22 mmol/L (ref 22–32)
Calcium: 10.5 mg/dL — ABNORMAL HIGH (ref 8.9–10.3)
Chloride: 108 mmol/L (ref 98–111)
Creatinine, Ser: 1.42 mg/dL — ABNORMAL HIGH (ref 0.44–1.00)
GFR, Estimated: 45 mL/min — ABNORMAL LOW (ref >60)
Glucose, Bld: 92 mg/dL (ref 70–99)
Potassium: 3.8 mmol/L (ref 3.5–5.1)
Sodium: 139 mmol/L (ref 135–145)
Total Bilirubin: 1 mg/dL (ref 0.0–1.2)
Total Protein: 6.4 g/dL — ABNORMAL LOW (ref 6.5–8.1)

## 2024-01-24 LAB — URINALYSIS, ROUTINE W REFLEX MICROSCOPIC
Bilirubin Urine: NEGATIVE
Glucose, UA: >=500 mg/dL — AB
Ketones, ur: NEGATIVE mg/dL
Leukocytes,Ua: NEGATIVE
Nitrite: NEGATIVE
Protein, ur: >=300 mg/dL — AB
Specific Gravity, Urine: 1.023 (ref 1.005–1.030)
pH: 5 (ref 5.0–8.0)

## 2024-01-24 LAB — RESP PANEL BY RT-PCR (RSV, FLU A&B, COVID)  RVPGX2
Influenza A by PCR: NEGATIVE
Influenza B by PCR: NEGATIVE
Resp Syncytial Virus by PCR: NEGATIVE
SARS Coronavirus 2 by RT PCR: NEGATIVE

## 2024-01-24 LAB — HCG, SERUM, QUALITATIVE: Preg, Serum: NEGATIVE

## 2024-01-24 LAB — CK: Total CK: 42 U/L (ref 38–234)

## 2024-01-24 LAB — MONONUCLEOSIS SCREEN: Mono Screen: NEGATIVE

## 2024-01-24 MED ORDER — ACETAMINOPHEN 325 MG PO TABS
650.0000 mg | ORAL_TABLET | Freq: Once | ORAL | Status: AC
  Administered 2024-01-24: 650 mg via ORAL
  Filled 2024-01-24: qty 2

## 2024-01-24 MED ORDER — SODIUM CHLORIDE 0.9 % IV BOLUS
1000.0000 mL | Freq: Once | INTRAVENOUS | Status: AC
  Administered 2024-01-24: 1000 mL via INTRAVENOUS

## 2024-01-24 NOTE — Discharge Instructions (Addendum)
 As we discussed, you likely have a viral infection.  Your liver enzymes are elevated.  I have sent off a EBV titers.  Your monoscreen is negative and you should see your EBV titers on MyChart tomorrow.  If you need help interpreting them please call your primary care doctor  Continue Tylenol  or Motrin  for fever  You likely will need a repeat liver function test in a week with your doctor  Return to ER if you have severe abdominal pain or vomiting or persistent fever for a week

## 2024-01-24 NOTE — ED Provider Notes (Signed)
 [place] EMERGENCY DEPARTMENT AT [facility] Provider Note   CSN: [id number] Arrival date & time: 01/24/24  1743     Patient presents with: Generalized Body Aches, Fatigue, and Tingling   VIVIANNA [name] is a 48 y.o. female history of hypertension, here presenting with fevers and back pain.  Patient has been running fevers since yesterday.  She also noted darker urine than usual.  Patient also has some electric feeling throughout her body as well.  Patient does have 2 teenagers at home but denies any other sick contacts.  Patient has nonproductive cough as well.   The history is provided by the patient.       Prior to Admission medications   Medication Sig Start Date End Date Taking? Authorizing Provider  buPROPion  (WELLBUTRIN  SR) 150 MG 12 hr tablet Take 1 tablet (150 mg total) by mouth 2 (two) times daily. 11/13/23   Billy Philippe SAUNDERS, NP  busPIRone  (BUSPAR ) 10 MG tablet Take 1 tablet (10 mg total) by mouth 2 (two) times daily. 12/19/23 03/18/24  Billy Philippe SAUNDERS, NP  busPIRone  (BUSPAR ) 15 MG tablet Take one tablet twice a day 12/19/23   Williamson, Joanna R, NP  Calcium  Carbonate (CALCIUM  500 PO) Take 1 tablet by mouth daily.    [provider]  cetirizine (ZYRTEC) 10 MG tablet Take 10 mg by mouth at bedtime.     [provider]  Cholecalciferol (VITAMIN D3) 1.25 MG (50000 UT) CAPS Take 1 capsule (1.25 mg total) by mouth every 7 (seven) days. 12/19/23   Billy Philippe SAUNDERS, NP  citalopram  (CELEXA ) 20 MG tablet Take 1 tablet (20 mg total) by mouth daily. 12/04/23   Billy Philippe SAUNDERS, NP  citalopram  (CELEXA ) 40 MG tablet Take 1 tablet (40 mg total) by mouth daily. 12/04/23   Williamson, Joanna R, NP  Evolocumab  (REPATHA  SURECLICK) 140 MG/ML SOAJ Inject 1 pen every 14 days, must schedule OV for further refills. 01/11/22   Hilty, Vinie BROCKS, MD  FARXIGA 10 MG TABS tablet Take 10 mg by mouth daily. 11/28/22   [provider]  Fish Oil-Cholecalciferol  (OMEGA-3 + VITAMIN D3 PO) Take 1 tablet by mouth daily. 4,000IU of vitamin D3 115mg  of Omega-3    [provider]  fluticasone  (FLONASE ) 50 MCG/ACT nasal spray Place 2 sprays into both nostrils as needed. 12/19/23 03/18/24  Billy Philippe SAUNDERS, NP  levothyroxine  (SYNTHROID ) 88 MCG tablet TAKE ONE TABLET BY MOUTH DAILY BEFORE BREAKFAST 08/10/23   Williamson, Joanna R, NP  lisinopril  (ZESTRIL ) 20 MG tablet Take 1 tablet (20 mg total) by mouth daily. 12/03/23 03/02/24  Billy Philippe SAUNDERS, NP  LORazepam  (ATIVAN ) 0.5 MG tablet Take 1 tablet (0.5 mg total) by mouth 2 (two) times daily as needed for anxiety. 12/19/23   Billy Philippe SAUNDERS, NP  montelukast  (SINGULAIR ) 10 MG tablet Take 1 tablet (10 mg total) by mouth at bedtime. 01/08/24   Billy Philippe SAUNDERS, NP  Multiple Vitamin (MULTIVITAMIN WITH MINERALS) TABS tablet Take 1 tablet by mouth daily.    [provider]  Polyethyl Glycol-Propyl Glycol (LUBRICANT EYE DROPS) 0.4-0.3 % SOLN Place 1 drop into both eyes 3 (three) times daily as needed (dry/irritated eyes).    [provider]  rosuvastatin (CRESTOR) 10 MG tablet Take 1 tablet (10 mg total) by mouth daily. 12/19/23   Billy Philippe SAUNDERS, NP  topiramate  (TOPAMAX ) 100 MG tablet Take 1 tablet (100 mg total) by mouth 2 (two) times daily. *NEEDS APPOINTMENT FOR FURTHER REFILLS* 01/01/24  Sherryl Bouchard, NP  Ubrogepant  (UBRELVY ) 100 MG TABS Take 1 tablet (100 mg total) by mouth as needed. May repeat in 2 hours if needed, no more than 2 pills in 24 hours. Must be seen for further refills. 11/08/23   Sherryl Bouchard, NP    Allergies: Welchol  [colesevelam ], Lidocaine , Pistachio nut (diagnostic), Relafen [nabumetone], Celebrex [celecoxib], and Sulfa antibiotics    Review of Systems  Constitutional:  Positive for chills and fever.  Respiratory:  Positive for cough.   Gastrointestinal:  Positive for abdominal pain.  All other systems reviewed and are negative.   Updated Vital  Signs BP (!) 143/76   Pulse 81   Temp (!) 100.5 F (38.1 C) (Oral)   Resp 18   SpO2 97%   Physical Exam Vitals and nursing note reviewed.  Constitutional:      Appearance: Normal appearance.  HENT:     Head: Normocephalic.     Nose: Nose normal.     Mouth/Throat:     Mouth: Mucous membranes are moist.  Eyes:     Extraocular Movements: Extraocular movements intact.     Pupils: Pupils are equal, round, and reactive to light.  Cardiovascular:     Rate and Rhythm: Normal rate and regular rhythm.     Pulses: Normal pulses.     Heart sounds: Normal heart sounds.  Pulmonary:     Effort: Pulmonary effort is normal.     Breath sounds: Normal breath sounds.  Abdominal:     General: Abdomen is flat.     Comments: Mild epigastric tenderness  Musculoskeletal:        General: Normal range of motion.     Cervical back: Normal range of motion and neck supple.  Skin:    General: Skin is warm.     Capillary Refill: Capillary refill takes less than 2 seconds.  Neurological:     General: No focal deficit present.     Mental Status: She is alert and oriented to person, place, and time.  Psychiatric:        Mood and Affect: Mood normal.        Behavior: Behavior normal.     (all labs ordered are listed, but only abnormal results are displayed) Labs Reviewed  COMPREHENSIVE METABOLIC PANEL WITH GFR - Abnormal; Notable for the following components:      Result Value   Creatinine, Ser 1.42 (*)    Calcium  10.5 (*)    Total Protein 6.4 (*)    AST 115 (*)    ALT 131 (*)    Alkaline Phosphatase 206 (*)    GFR, Estimated 45 (*)    All other components within normal limits  CBC WITH DIFFERENTIAL/PLATELET - Abnormal; Notable for the following components:   Platelets 115 (*)    All other components within normal limits  RESP PANEL BY RT-PCR (RSV, FLU A&B, COVID)  RVPGX2  URINALYSIS, ROUTINE W REFLEX MICROSCOPIC  MONONUCLEOSIS SCREEN  EPSTEIN-BARR VIRUS VCA, IGG  EPSTEIN-BARR VIRUS VCA,  IGM  CK  HCG, SERUM, QUALITATIVE  HEPATITIS PANEL, ACUTE    EKG: None  Radiology: DG Chest 2 View Result Date: 01/24/2024 EXAM: 2 VIEW(S) XRAY OF THE CHEST 01/24/2024 09:21:17 PM COMPARISON: 07/11/2022 CLINICAL HISTORY: cough FINDINGS: LUNGS AND PLEURA: No focal infiltrate is noted. No pleural effusion. No pneumothorax. HEART AND MEDIASTINUM: No acute abnormality of the cardiac and mediastinal silhouettes. BONES AND SOFT TISSUES: No acute osseous abnormality. IMPRESSION: 1. No acute abnormality noted. Electronically signed by: Oneil Devonshire  MD 01/24/2024 09:25 PM EST RP Workstation: [id number]   CT Renal Stone Study Result Date: 01/24/2024 CLINICAL DATA:  Abdominal/flank pain. EXAM: CT ABDOMEN AND PELVIS WITHOUT CONTRAST TECHNIQUE: Multidetector CT imaging of the abdomen and pelvis was performed following the standard protocol without IV contrast. RADIATION DOSE REDUCTION: This exam was performed according to the departmental dose-optimization program which includes automated exposure control, adjustment of the mA and/or kV according to patient size and/or use of iterative reconstruction technique. COMPARISON:  Renal ultrasound dated 12/01/2022. Abdominal CT dated 12/28/2022. FINDINGS: Evaluation of this exam is limited in the absence of intravenous contrast. Lower chest: The visualized lung bases are clear. No intra-abdominal free air or free fluid. Hepatobiliary: The liver is unremarkable. No biliary dilatation. The gallbladder is unremarkable. Pancreas: Unremarkable. No pancreatic ductal dilatation or surrounding inflammatory changes. Spleen: Splenomegaly measuring 17 cm. Adrenals/Urinary Tract: The adrenal glands unremarkable. Mild bilateral renal parenchyma atrophy. There is a punctate nonobstructing right renal interpolar calculus. No hydronephrosis or obstructing stone on either side. The visualized ureters and urinary bladder unremarkable. Stomach/Bowel: Postsurgical changes of gastric sleeve.  There is moderate stool throughout the colon. There is no bowel obstruction or active inflammation. The appendix is normal. Vascular/Lymphatic: The abdominal aorta and IVC are grossly unremarkable on this noncontrast CT. No portal venous gas. Several rounded top-normal and mildly enlarged upper abdominal and gastrohepatic lymph nodes measure up to 12 mm of indeterminate etiology or clinical significance. Similar findings were seen on CT of 01/16/2019. Reproductive: The uterus is grossly unremarkable. No suspicious adnexal masses. Other: None Musculoskeletal: No acute or significant osseous findings. IMPRESSION: 1. No acute intra-abdominal or pelvic pathology. 2. Punctate nonobstructing right renal interpolar calculus. No hydronephrosis or obstructing stone. 3. Splenomegaly. 4. Postsurgical changes of gastric sleeve. No bowel obstruction. Normal appendix. Electronically Signed   By: Vanetta Chou M.D.   On: 01/24/2024 19:58     Procedures   Medications Ordered in the ED  acetaminophen  (TYLENOL ) tablet 650 mg (has no administration in time range)  sodium chloride  0.9 % bolus 1,000 mL (has no administration in time range)                                    Medical Decision Making BRIENA SWINGLER is a 48 y.o. female here with abdominal pain and cough and back pain.  Patient also has a fever as well.  Differential includes pyelonephritis versus biliary colic versus pneumonia versus UTI versus abdominal infection.  Plan to get CBC and CMP and CT abdomen pelvis and UA and chest x-ray.  10:38 PM Reviewed patient's labs and white blood cell count is normal.  Patient's liver enzymes are slightly elevated.  CT showed splenomegaly.  I am concerned for possible monoinfection.  Monoscreen is negative and added on EBV titers.  Her CK level is normal and urinalysis negative.  At this point I do not see any signs of bacterial infection.  I think likely viral infection such as mono causing her elevated LFTs.   Recommend antipyretics and follow-up with PCP.  Problems Addressed: Chills: acute illness or injury Elevated LFTs: acute illness or injury Fever, unspecified fever cause: acute illness or injury  Amount and/or Complexity of Data Reviewed Labs: ordered. Decision-making details documented in ED Course. Radiology: ordered and independent interpretation performed. Decision-making details documented in ED Course.  Risk OTC drugs.     Final diagnoses:  None    ED  Discharge Orders     None          Patt Alm Macho, MD 01/24/24 2240

## 2024-01-24 NOTE — ED Triage Notes (Signed)
 Pt arrives via POV from home. Pt reports tingling sensation to all extremities and head, feeling hot, body aches, lightheaded, and generalized weakness since last night. Reports her urine is darker in color. She does have a temp of 100.6 during triage. Pt is AxOx4.

## 2024-01-24 NOTE — ED Provider Triage Note (Signed)
 Emergency Medicine Provider Triage Evaluation Note  Melanie [name] , a 48 y.o. female  was evaluated in triage.  Pt complains of bodyaches, dysuria, dark urine, lightheadedness, chills x 2 days.  Patient reports yesterday she started feeling ill and has noted some recent dark-colored urine and dysuria.  Patient denies she had some lightheadedness and weakness along with some bodyaches as well.  She has significant history of chronic kidney disease and anxiety.  She reports an associated electric feeling throughout her entire body with associated body aches.  She does have history of shingles but denies any rashes and reports the feeling is all of her body and not in 1 single area.  Review of Systems  Positive: Body aches, chills, lightheadedness, weakness, dysuria Negative: Chest pain, shortness of breath, abdominal pain, nausea, vomiting  Physical Exam  BP 104/81   Pulse 78   Temp (!) 100.6 F (38.1 C) (Oral)   Resp 18   SpO2 96%  Gen:   Awake, no distress   Resp:  Normal effort lungs clear to auscultation all fields MSK:   Moves extremities without difficulty  Other:  Patient has no abdominal pain to palpation.  She does have some right CVA tenderness.  Medical Decision Making  Medically screening exam initiated at 6:16 PM.  Appropriate orders placed.  DONINIQUE LWIN was informed that the remainder of the evaluation will be completed by another provider, this initial triage assessment does not replace that evaluation, and the importance of remaining in the ED until their evaluation is complete.     Myriam Fonda RAMAN, [place] 01/24/24 1820

## 2024-01-25 ENCOUNTER — Other Ambulatory Visit

## 2024-01-25 LAB — HEPATITIS PANEL, ACUTE
HCV Ab: NONREACTIVE
Hep A IgM: NONREACTIVE
Hep B C IgM: NONREACTIVE
Hepatitis B Surface Ag: NONREACTIVE

## 2024-01-26 LAB — EPSTEIN-BARR VIRUS VCA, IGM: EBV VCA IgM: <36 U/mL (ref 0.0–35.9)

## 2024-01-26 LAB — EPSTEIN-BARR VIRUS VCA, IGG: EBV VCA IgG: 34.1 U/mL — ABNORMAL HIGH (ref 0.0–17.9)

## 2024-01-28 ENCOUNTER — Ambulatory Visit: Payer: Self-pay

## 2024-01-28 NOTE — Telephone Encounter (Signed)
 FYI Only or Action Required?: Action required by provider: request for appointment and clinical question for provider.  Patient was last seen in primary care on 01/18/2024 by Billy Philippe SAUNDERS, NP.  Called Nurse Triage reporting No chief complaint on file..  Symptoms began several days ago.  Interventions attempted: Rest, hydration, or home remedies and Other: ER Visit on Thursday.  Symptoms are: gradually worsening.  Triage Disposition: Go to ED Now (Notify PCP)  Patient/caregiver understands and will follow disposition?: Unsure   Copied from CRM [id number]. Topic: Clinical - Red Word Triage >> Jan 28, 2024 11:32 AM Revonda D wrote: Red Word that prompted transfer to Nurse Triage: pain   Pt stated that she isn't feeling well and feels weak. Pt stated that she is experiencing headaches, pain in lower back and dark urine. Pt would like to schedule a virtual visit with the provider. Reason for Disposition  Dark (cola or tea-colored) or red-colored urine  Answer Assessment - Initial Assessment Questions 1. ONSET: When did the muscle aches or body pains start?      Since Wednesday  2. LOCATION: What part of your body is hurting? (e.g., entire body, arms, legs)      Head, Neck, Back, Abdominal Pain  3. SEVERITY: How bad is the pain? (Scale 1-10; or mild, moderate, severe)     Moderate   4. CAUSE: What do you think is causing the pains?     Unsure  5. FEVER: Do you have a fever? If Yes, ask: What is your temperature, how was it measured, and  when did it start?      Unsure,   6. OTHER SYMPTOMS: Do you have any other symptoms? (e.g., chest pain, cold or flu symptoms, rash, weakness, weight loss)     Dark Urine, Nausea, Loss of Appetite  7. PREGNANCY: Is there any chance you are pregnant? When was your last menstrual period?     No and No  8. TRAVEL: Have you traveled out of the country in the last month? (e.g., exposures, travel history)     No  Protocols  used: Muscle Aches and Body Pain-A-AH

## 2024-01-29 ENCOUNTER — Emergency Department (HOSPITAL_BASED_OUTPATIENT_CLINIC_OR_DEPARTMENT_OTHER)
Admission: EM | Admit: 2024-01-29 | Discharge: 2024-01-30 | Disposition: A | Discharge status: Home / Self Care | Attending: Emergency Medicine | Admitting: Emergency Medicine

## 2024-01-29 ENCOUNTER — Emergency Department (HOSPITAL_BASED_OUTPATIENT_CLINIC_OR_DEPARTMENT_OTHER)

## 2024-01-29 ENCOUNTER — Other Ambulatory Visit: Payer: Self-pay

## 2024-01-29 ENCOUNTER — Ambulatory Visit: Payer: Self-pay

## 2024-01-29 DIAGNOSIS — R109 Unspecified abdominal pain: Secondary | ICD-10-CM | POA: Diagnosis not present

## 2024-01-29 DIAGNOSIS — N189 Chronic kidney disease, unspecified: Secondary | ICD-10-CM | POA: Diagnosis not present

## 2024-01-29 DIAGNOSIS — R7401 Elevation of levels of liver transaminase levels: Secondary | ICD-10-CM | POA: Insufficient documentation

## 2024-01-29 DIAGNOSIS — R590 Localized enlarged lymph nodes: Secondary | ICD-10-CM | POA: Diagnosis not present

## 2024-01-29 DIAGNOSIS — R591 Generalized enlarged lymph nodes: Secondary | ICD-10-CM

## 2024-01-29 DIAGNOSIS — R509 Fever, unspecified: Secondary | ICD-10-CM | POA: Insufficient documentation

## 2024-01-29 DIAGNOSIS — E039 Hypothyroidism, unspecified: Secondary | ICD-10-CM | POA: Diagnosis not present

## 2024-01-29 DIAGNOSIS — R7989 Other specified abnormal findings of blood chemistry: Secondary | ICD-10-CM

## 2024-01-29 DIAGNOSIS — I129 Hypertensive chronic kidney disease with stage 1 through stage 4 chronic kidney disease, or unspecified chronic kidney disease: Secondary | ICD-10-CM | POA: Diagnosis not present

## 2024-01-29 DIAGNOSIS — Z79899 Other long term (current) drug therapy: Secondary | ICD-10-CM | POA: Insufficient documentation

## 2024-01-29 LAB — RAPID HIV SCREEN (HIV 1/2 AB+AG)
HIV 1/2 Antibodies: NONREACTIVE
HIV-1 P24 Antigen - HIV24: NONREACTIVE

## 2024-01-29 LAB — COMPREHENSIVE METABOLIC PANEL WITH GFR
ALT: 120 U/L — ABNORMAL HIGH (ref 0–44)
AST: 100 U/L — ABNORMAL HIGH (ref 15–41)
Albumin: 3.4 g/dL — ABNORMAL LOW (ref 3.5–5.0)
Alkaline Phosphatase: 369 U/L — ABNORMAL HIGH (ref 38–126)
Anion gap: 10 (ref 5–15)
BUN: 24 mg/dL — ABNORMAL HIGH (ref 6–20)
CO2: 24 mmol/L (ref 22–32)
Calcium: 11.3 mg/dL — ABNORMAL HIGH (ref 8.9–10.3)
Chloride: 101 mmol/L (ref 98–111)
Creatinine, Ser: 1.56 mg/dL — ABNORMAL HIGH (ref 0.44–1.00)
GFR, Estimated: 40 mL/min — ABNORMAL LOW (ref >60)
Glucose, Bld: 108 mg/dL — ABNORMAL HIGH (ref 70–99)
Potassium: 4.2 mmol/L (ref 3.5–5.1)
Sodium: 135 mmol/L (ref 135–145)
Total Bilirubin: 0.9 mg/dL (ref 0.0–1.2)
Total Protein: 7.1 g/dL (ref 6.5–8.1)

## 2024-01-29 LAB — HEPATITIS PANEL, ACUTE
HCV Ab: NONREACTIVE
Hep A IgM: NONREACTIVE
Hep B C IgM: NONREACTIVE
Hepatitis B Surface Ag: NONREACTIVE

## 2024-01-29 LAB — CBC
HCT: 41.1 % (ref 36.0–46.0)
Hemoglobin: 14.2 g/dL (ref 12.0–15.0)
MCH: 31.3 pg (ref 26.0–34.0)
MCHC: 34.5 g/dL (ref 30.0–36.0)
MCV: 90.5 fL (ref 80.0–100.0)
Platelets: 158 K/uL (ref 150–400)
RBC: 4.54 MIL/uL (ref 3.87–5.11)
RDW: 13.8 % (ref 11.5–15.5)
WBC: 7.9 K/uL (ref 4.0–10.5)
nRBC: 0 % (ref 0.0–0.2)

## 2024-01-29 LAB — LIPASE, BLOOD: Lipase: 46 U/L (ref 11–51)

## 2024-01-29 LAB — URINALYSIS, ROUTINE W REFLEX MICROSCOPIC
Bilirubin Urine: NEGATIVE
Cellular Cast, UA: 1
Glucose, UA: 500 mg/dL — AB
Ketones, ur: NEGATIVE mg/dL
Leukocytes,Ua: NEGATIVE
Nitrite: NEGATIVE
Protein, ur: >300 mg/dL — AB
Specific Gravity, Urine: >1.046 — ABNORMAL HIGH (ref 1.005–1.030)
pH: 6 (ref 5.0–8.0)

## 2024-01-29 LAB — CBG MONITORING, ED: Glucose-Capillary: 114 mg/dL — ABNORMAL HIGH (ref 70–99)

## 2024-01-29 LAB — PREGNANCY, URINE: Preg Test, Ur: NEGATIVE

## 2024-01-29 MED ORDER — AZITHROMYCIN 250 MG PO TABS: 250.0000 mg | ORAL_TABLET | Freq: Every day | ORAL | 0 refills | Status: DC

## 2024-01-29 MED ORDER — SODIUM CHLORIDE 0.9 % IV SOLN
500.0000 mg | Freq: Once | INTRAVENOUS | Status: AC
  Administered 2024-01-29: 500 mg via INTRAVENOUS

## 2024-01-29 MED ORDER — IOHEXOL 300 MG/ML  SOLN
100.0000 mL | Freq: Once | INTRAMUSCULAR | Status: AC | PRN
  Administered 2024-01-29: 100 mL via INTRAVENOUS

## 2024-01-29 MED ORDER — FENTANYL CITRATE (PF) 50 MCG/ML IJ SOSY
50.0000 ug | PREFILLED_SYRINGE | Freq: Once | INTRAMUSCULAR | Status: AC
  Administered 2024-01-29: 50 ug via INTRAVENOUS
  Filled 2024-01-29: qty 1

## 2024-01-29 MED ORDER — LACTATED RINGERS IV BOLUS
1000.0000 mL | Freq: Once | INTRAVENOUS | Status: AC
  Administered 2024-01-29: 1000 mL via INTRAVENOUS

## 2024-01-29 MED ORDER — ONDANSETRON HCL 4 MG/2ML IJ SOLN
4.0000 mg | Freq: Once | INTRAMUSCULAR | Status: AC
  Administered 2024-01-29: 4 mg via INTRAVENOUS
  Filled 2024-01-29: qty 2

## 2024-01-29 MED ORDER — SODIUM CHLORIDE 0.9 % IV SOLN
INTRAVENOUS | Status: AC
  Filled 2024-01-29: qty 5

## 2024-01-29 MED ORDER — KETOROLAC TROMETHAMINE 15 MG/ML IJ SOLN
15.0000 mg | Freq: Once | INTRAMUSCULAR | Status: AC
  Administered 2024-01-30: 15 mg via INTRAVENOUS
  Filled 2024-01-29: qty 1

## 2024-01-29 MED ORDER — ONDANSETRON 4 MG PO TBDP: 4.0000 mg | ORAL_TABLET | Freq: Three times a day (TID) | ORAL | 0 refills | Status: AC | PRN

## 2024-01-29 MED ORDER — ALUM & MAG HYDROXIDE-SIMETH 200-200-20 MG/5ML PO SUSP
15.0000 mL | Freq: Once | ORAL | Status: AC
  Administered 2024-01-29: 15 mL via ORAL
  Filled 2024-01-29: qty 30

## 2024-01-29 NOTE — Telephone Encounter (Signed)
 FYI Only or Action Required?: FYI only for provider: ED advised.  Patient was last seen in primary care on 01/18/2024 by Melanie Philippe SAUNDERS, NP.  Called Nurse Triage reporting Headache and Fatigue.  Symptoms began several days ago.  Interventions attempted: Nothing.  Symptoms are: gradually worsening.  Triage Disposition: See HCP Within 4 Hours (Or PCP Triage)  Patient/caregiver understands and will follow disposition?:   Copied from CRM #[id number]. Topic: Clinical - Red Word Triage >> Jan 29, 2024  4:30 PM [name] wrote: [facility] that prompted transfer to Nurse Triage: Body weakness, patient cannot stand longer than 5 minutes. Pain around spleen and lower back. Patient also has headaches. Reason for Disposition  [1] SEVERE pain (e.g., excruciating) AND [2] present > 1 hour  Answer Assessment - Initial Assessment Questions Advised ED now.  Patient seen in ED last Thursday, reports worsening symptoms.  Patient requests letter from work; ED excuse only until today, patient still not feeling well and need more time off.  1. LOCATION: Where does it hurt?      Fatigue, left upper abd pain, lower back pain, bodyaches, HA, Dizziness, lightheadness, chills Able to stand and walk short distance, able to eat and drink, last BM today, urinating without difficulties; dark colored urine  Denies chest pain, sob, fever, n/v  3. ONSET: When did the pain begin? (e.g., minutes, hours or days ago)      Week ago  5. PATTERN Does the pain come and go, or is it constant?     constant 6. SEVERITY: How bad is the pain?  (e.g., Scale 1-10; mild, moderate, or severe)     4/10; tender to touch left upper abd 7. RECURRENT SYMPTOM: Have you ever had this type of stomach pain before? If Yes, ask: When was the last time? and What happened that time?      Since last Thursday 8. CAUSE: What do you think is causing the stomach pain? (e.g., gallstones, recent abdominal surgery)      no 10. OTHER SYMPTOMS: Do you have any other symptoms? (e.g., back pain, diarrhea, fever, urination pain, vomiting)  Protocols used: Abdominal Pain - Female-A-AH

## 2024-01-29 NOTE — ED Triage Notes (Signed)
 Pt POV reporting persistent fatigue, neck tingling, and headache, seen for same 11/13 in ED, concerned for mono, neg, advised to return if sx persist.

## 2024-01-29 NOTE — ED Provider Notes (Addendum)
 [place] [facility] AT [facility] Provider Note   CSN: [id number] Arrival date & time: 01/29/24  1741     Patient presents with: Fatigue and Generalized Body Aches   Melanie [name] is a 48 y.o. female.   HPI   48 year old female with medical history significant for obesity, OSA, HLD, liver fibrosis, chronic pain of both feet, vitamin D  deficiency, hypothyroidism, hypertension, anxiety, hyperlipidemia, seizures, shingles, CKD presenting to the emergency department with fever since this past Thursday, generalized body aches.  The patient has a fever since Thursday night, Tmax 101.  She has had electrical shock sensations in her limbs with profound weakness and fatigue, poor appetite, decreased oral intake, associated chills and sweats.  She endorses some soreness in her neck.  She has had electrical zaps in her neck and back.  She has had a functional decline at home with increasing exhaustion.  She could not fully shower herself last night needed help getting out of the shower and could not stand to get her nighttime medications.  She feels dehydrated.  This morning she had some improvement walked to the bathroom alone got meds by herself but by the afternoon she felt worse again.  She endorses abdominal pain located in the left quadrant between her ribs and hip.  Has been stabbing in quality radiating around to the back.  Pain has increased.  Urine today has been dark.  She slumped in triage today unable to sit up and went minimally responsive.  She had a period of confusion after this with emotional lability and now is back to baseline after essentially a near syncopal episode.  Does not have a fear of needles.  She still has a gallbladder, denies any right upper quadrant pain, has had no vomiting.  She has not had a full episode of syncope.  She denies any chest pain or shortness of breath.  Prior to Admission medications   Medication Sig Start Date End Date Taking?  Authorizing Provider  azithromycin  (ZITHROMAX ) 250 MG tablet Take 1 tablet (250 mg total) by mouth daily. Take first 2 tablets together, then 1 every day until finished. 01/29/24  Yes Jerrol Agent, MD  ondansetron  (ZOFRAN -ODT) 4 MG disintegrating tablet Take 1 tablet (4 mg total) by mouth every 8 (eight) hours as needed. 01/29/24  Yes Jerrol Agent, MD  buPROPion  (WELLBUTRIN  SR) 150 MG 12 hr tablet Take 1 tablet (150 mg total) by mouth 2 (two) times daily. 11/13/23   Billy Philippe SAUNDERS, NP  busPIRone  (BUSPAR ) 10 MG tablet Take 1 tablet (10 mg total) by mouth 2 (two) times daily. 12/19/23 03/18/24  Billy Philippe SAUNDERS, NP  busPIRone  (BUSPAR ) 15 MG tablet Take one tablet twice a day 12/19/23   Williamson, Joanna R, NP  Calcium  Carbonate (CALCIUM  500 PO) Take 1 tablet by mouth daily.    [provider]  cetirizine (ZYRTEC) 10 MG tablet Take 10 mg by mouth at bedtime.     [provider]  Cholecalciferol (VITAMIN D3) 1.25 MG (50000 UT) CAPS Take 1 capsule (1.25 mg total) by mouth every 7 (seven) days. 12/19/23   Billy Philippe SAUNDERS, NP  citalopram  (CELEXA ) 20 MG tablet Take 1 tablet (20 mg total) by mouth daily. 12/04/23   Billy Philippe SAUNDERS, NP  citalopram  (CELEXA ) 40 MG tablet Take 1 tablet (40 mg total) by mouth daily. 12/04/23   Billy Philippe SAUNDERS, NP  Evolocumab  (REPATHA  SURECLICK) 140 MG/ML SOAJ Inject 1 pen every 14 days, must schedule OV for  further refills. 01/11/22   Hilty, Vinie BROCKS, MD  FARXIGA 10 MG TABS tablet Take 10 mg by mouth daily. 11/28/22   [provider]  Fish Oil-Cholecalciferol (OMEGA-3 + VITAMIN D3 PO) Take 1 tablet by mouth daily. 4,000IU of vitamin D3 115mg  of Omega-3    [provider]  fluticasone  (FLONASE ) 50 MCG/ACT nasal spray Place 2 sprays into both nostrils as needed. 12/19/23 03/18/24  Billy Philippe SAUNDERS, NP  levothyroxine  (SYNTHROID ) 88 MCG tablet TAKE ONE TABLET BY MOUTH DAILY BEFORE BREAKFAST 08/10/23   Williamson, Joanna R, NP   lisinopril  (ZESTRIL ) 20 MG tablet Take 1 tablet (20 mg total) by mouth daily. 12/03/23 03/02/24  Billy Philippe SAUNDERS, NP  LORazepam  (ATIVAN ) 0.5 MG tablet Take 1 tablet (0.5 mg total) by mouth 2 (two) times daily as needed for anxiety. 12/19/23   Billy Philippe SAUNDERS, NP  montelukast  (SINGULAIR ) 10 MG tablet Take 1 tablet (10 mg total) by mouth at bedtime. 01/08/24   Billy Philippe SAUNDERS, NP  Multiple Vitamin (MULTIVITAMIN WITH MINERALS) TABS tablet Take 1 tablet by mouth daily.    [provider]  Polyethyl Glycol-Propyl Glycol (LUBRICANT EYE DROPS) 0.4-0.3 % SOLN Place 1 drop into both eyes 3 (three) times daily as needed (dry/irritated eyes).    [provider]  rosuvastatin (CRESTOR) 10 MG tablet Take 1 tablet (10 mg total) by mouth daily. 12/19/23   Billy Philippe SAUNDERS, NP  topiramate  (TOPAMAX ) 100 MG tablet Take 1 tablet (100 mg total) by mouth 2 (two) times daily. *NEEDS APPOINTMENT FOR FURTHER REFILLS* 01/01/24   Sherryl Bouchard, NP  Ubrogepant  (UBRELVY ) 100 MG TABS Take 1 tablet (100 mg total) by mouth as needed. May repeat in 2 hours if needed, no more than 2 pills in 24 hours. Must be seen for further refills. 11/08/23   Sherryl Bouchard, NP    Allergies: Welchol  [colesevelam ], Lidocaine , Pistachio nut (diagnostic), Relafen [nabumetone], Celebrex [celecoxib], and Sulfa antibiotics    Review of Systems  Constitutional:  Positive for chills and fever.  Gastrointestinal:  Positive for abdominal pain.  All other systems reviewed and are negative.   Updated Vital Signs BP 114/60   Pulse 74   Temp 98.4 F (36.9 C) (Oral)   Resp 18   SpO2 96%   Physical Exam Vitals and nursing note reviewed.  Constitutional:      General: She is not in acute distress.    Appearance: She is obese.  HENT:     Head: Normocephalic and atraumatic.  Eyes:     Conjunctiva/sclera: Conjunctivae normal.     Pupils: Pupils are equal, round, and reactive to light.  Neck:     Comments: No  meningismus Cardiovascular:     Rate and Rhythm: Normal rate and regular rhythm.  Pulmonary:     Effort: Pulmonary effort is normal. No respiratory distress.     Breath sounds: Normal breath sounds.  Abdominal:     General: There is no distension.     Tenderness: There is abdominal tenderness. There is no guarding.     Comments: Left-sided abdominal tenderness to palpation, no rebound or guarding  Musculoskeletal:        General: No deformity or signs of injury.     Cervical back: Neck supple.     Comments: Healing punctate wounds to the right lower extremity  Skin:    Findings: No lesion or rash.  Neurological:     General: No focal deficit present.     Mental Status: She  is alert. Mental status is at baseline.     Comments: MENTAL STATUS EXAM:    Orientation: Alert and oriented to person, place and time.  Memory: Cooperative, follows commands well.  Language: Speech is clear and language is normal.   CRANIAL NERVES:    CN 2 (Optic): Visual fields intact to confrontation.  CN 3,4,6 (EOM): Pupils equal and reactive to light. Full extraocular eye movement without nystagmus.  CN 5 (Trigeminal): Facial sensation is normal, no weakness of masticatory muscles.  CN 7 (Facial): No facial weakness or asymmetry.  CN 8 (Auditory): Auditory acuity grossly normal.  CN 9,10 (Glossophar): The uvula is midline, the palate elevates symmetrically.  CN 11 (spinal access): Normal sternocleidomastoid and trapezius strength.  CN 12 (Hypoglossal): The tongue is midline. No atrophy or fasciculations.SABRA   MOTOR:  Muscle Strength: 5/5RUE, 5/5LUE, 5/5RLE, 5/5LLE.   COORDINATION:   No tremor.   SENSATION:   Intact to light touch all four extremities.        (all labs ordered are listed, but only abnormal results are displayed) Labs Reviewed  COMPREHENSIVE METABOLIC PANEL WITH GFR - Abnormal; Notable for the following components:      Result Value   Glucose, Bld 108 (*)    BUN 24 (*)     Creatinine, Ser 1.56 (*)    Calcium  11.3 (*)    Albumin 3.4 (*)    AST 100 (*)    ALT 120 (*)    Alkaline Phosphatase 369 (*)    GFR, Estimated 40 (*)    All other components within normal limits  URINALYSIS, ROUTINE W REFLEX MICROSCOPIC - Abnormal; Notable for the following components:   Specific Gravity, Urine >1.046 (*)    Glucose, UA 500 (*)    Hgb urine dipstick MODERATE (*)    Protein, ur >300 (*)    Bacteria, UA RARE (*)    All other components within normal limits  CBG MONITORING, ED - Abnormal; Notable for the following components:   Glucose-Capillary 114 (*)    All other components within normal limits  RESPIRATORY PANEL BY PCR  CULTURE, BLOOD (ROUTINE X 2)  CULTURE, BLOOD (ROUTINE X 2)  LIPASE, BLOOD  CBC  PREGNANCY, URINE  HEPATITIS PANEL, ACUTE  RAPID HIV SCREEN (HIV 1/2 AB+AG)  RPR  BARTONELLA ANTIBODY PANEL  MISC [facility] TEST (SEND OUT)  CMV IGM  CMV ANTIBODY, IGG (EIA)    EKG: EKG Interpretation Date/Time:  Wednesday January 30 2024 00:57:28 EST Ventricular Rate:  61 PR Interval:  159 QRS Duration:  97 QT Interval:  415 QTC Calculation: 418 R Axis:   51  Text Interpretation: Sinus rhythm Minimal ST depression, inferior leads Baseline wander in lead(s) II III aVF Confirmed by Midge Golas ([id number]) on 01/30/2024 1:15:43 AM  Radiology: CT ABDOMEN PELVIS W CONTRAST Result Date: 01/29/2024 CLINICAL DATA:  Persistent fatigue, neck tingling and headache. EXAM: CT ABDOMEN AND PELVIS WITH CONTRAST TECHNIQUE: Multidetector CT imaging of the abdomen and pelvis was performed using the standard protocol following bolus administration of intravenous contrast. RADIATION DOSE REDUCTION: This exam was performed according to the departmental dose-optimization program which includes automated exposure control, adjustment of the mA and/or kV according to patient size and/or use of iterative reconstruction technique. CONTRAST:  OMNIPAQUE  IOHEXOL  300 MG/ML  SOLN  COMPARISON:  January 24, 2024 FINDINGS: Lower chest: Mild linear atelectasis is seen within the right lung base. Hepatobiliary: No focal liver abnormality is seen. No gallstones, gallbladder wall thickening, or biliary dilatation.  Pancreas: Unremarkable. No pancreatic ductal dilatation or surrounding inflammatory changes. Spleen: There is mild to moderate severity splenomegaly. Adrenals/Urinary Tract: Adrenal glands are unremarkable. Kidneys are normal, without renal calculi, focal lesion, or hydronephrosis. Bladder is unremarkable. Stomach/Bowel: Surgical sutures are seen throughout the gastric region. Appendix appears normal. No evidence of bowel wall thickening, distention, or inflammatory changes. Vascular/Lymphatic: No significant vascular findings are present. Multiple enlarged periportal lymph nodes are seen. These are increased in size when compared to the prior study. The largest measures approximately 3.5 cm. Stable subcentimeter para-aortic and aortocaval lymph nodes are noted. Enlarged, predominantly stable pelvic lymph nodes are seen along the right iliac vessels (the largest measures approximately 2.4 cm). There is stable marked severity right inguinal lymphadenopathy. Reproductive: Uterus and bilateral adnexa are unremarkable. Other: No abdominal wall hernia or abnormality. No abdominopelvic ascites. Musculoskeletal: No acute or significant osseous findings. IMPRESSION: 1. Multiple enlarged periportal lymph nodes, increased in size when compared to the prior study. 2. Stable pelvic and marked severity right inguinal lymphadenopathy. Further evaluation with a nuclear medicine PET/CT is recommended to exclude the presence of an underlying neoplastic process. 3. Evidence of prior gastric surgery. Electronically Signed   By: Suzen Dials M.D.   On: 01/29/2024 21:26     Procedures   Medications Ordered in the ED  lactated ringers  bolus 1,000 mL (0 mLs Intravenous Stopped 01/29/24 2111)   fentaNYL  (SUBLIMAZE ) injection 50 mcg (50 mcg Intravenous Given 01/29/24 2009)  ondansetron  (ZOFRAN ) injection 4 mg (4 mg Intravenous Given 01/29/24 2007)  iohexol  (OMNIPAQUE ) 300 MG/ML solution 100 mL (100 mLs Intravenous Contrast Given 01/29/24 2037)  lactated ringers  bolus 1,000 mL (0 mLs Intravenous Stopped 01/29/24 2359)  azithromycin  (ZITHROMAX ) 500 mg in sodium chloride  0.9 % 250 mL IVPB (0 mg Intravenous Stopped 01/30/24 0000)  alum & mag hydroxide-simeth (MAALOX/MYLANTA) 200-200-20 MG/5ML suspension 15 mL (15 mLs Oral Given 01/29/24 2314)  ketorolac (TORADOL) 15 MG/ML injection 15 mg (15 mg Intravenous Given 01/30/24 0009)                                    Medical Decision Making Amount and/or Complexity of Data Reviewed Labs: ordered. Radiology: ordered.  Risk OTC drugs. Prescription drug management.    47 year old female with medical history significant for obesity, OSA, HLD, liver fibrosis, chronic pain of both feet, vitamin D  deficiency, hypothyroidism, hypertension, anxiety, hyperlipidemia, seizures, shingles, CKD presenting to the emergency department with fever since this past Thursday, generalized body aches.  The patient has a fever since Thursday night, Tmax 101.  She has had electrical shock sensations in her limbs with profound weakness and fatigue, poor appetite, decreased oral intake, associated chills and sweats.  She endorses some soreness in her neck.  She has had electrical zaps in her neck and back.  She has had a functional decline at home with increasing exhaustion.  She could not fully shower herself last night needed help getting out of the shower and could not stand to get her nighttime medications.  She feels dehydrated.  This morning she had some improvement walked to the bathroom alone got meds by herself but by the afternoon she felt worse again.  She endorses abdominal pain located in the left quadrant between her ribs and hip.  Has been stabbing in  quality radiating around to the back.  Pain has increased.  Urine today has been dark.  She slumped in triage today  unable to sit up and went minimally responsive.  She had a period of confusion after this with emotional lability and now is back to baseline after essentially a near syncopal episode.  Does not have a fear of needles.  She still has a gallbladder, denies any right upper quadrant pain, has had no vomiting.  She has not had a full episode of syncope.  She denies any chest pain or shortness of breath.  On arrival, the patient was afebrile, not tachycardic or tachypneic, BP 119/78, saturating 94% on room air.  On exam the patient has left sided abdominal tenderness to palpation, no rebound or guarding.  She appeared fatigued. Neuro intact. No meningismus. Abd TTP in the left upper and lower quadrant.  Patient previous workup reviewed, COVID flu and RSV PCR testing had been negative and the patient had mononucleosis testing which showed a positive IgG but negative IgM indicating no acute infection.   EKG: Sinus rhythm, rate 61, no acute ischemic changes, no significant change from prior tracing  Labs: CBC without leukocytosis or anemia, CMP with creatinine close to baseline at 1.56, LFTs elevated similar to prior with an AST of 100, ALT of 120, alkaline phosphatase elevated at 369 T. bili normal.  CBG 114, lipase normal. Cr close to baseline, pt with elevated LFTs but also has hx of NASH.  CT abdomen pelvis: IMPRESSION:  1. Multiple enlarged periportal lymph nodes, increased in size when  compared to the prior study.  2. Stable pelvic and marked severity right inguinal lymphadenopathy.  Further evaluation with a nuclear medicine PET/CT is recommended to  exclude the presence of an underlying neoplastic process.  3. Evidence of prior gastric surgery.    Spoke with Dr. Autumn of on-call oncology and reviewed the patient's case and presentation with him.  He agreed with the plan for course  of azithromycin  and follow-up with primary care and if persistent symptoms then outpatient referral to oncology can be performed.  Dr. Overton of on-call infectious disease was consulted who recommended additional labs to include HIV antibody testing, RPR, recommended Bartonella antibody testing, CMV antibody testing, blood cultures, Bartonella PCR testing as a miscellaneous [facility] send out.  Remainder of labs: Urinalysis negative for UTI, urine pregnancy negative.  Blood cultures collected and pending, remaining labs recommended by ID collected and pending.   On reassessment, the patient was feeling symptomatically improved, still fatigued. No CP or SOB.  Plan for outpatient management discussed with the patient and family at bedside, patient overall stable for discharge. Plan at discharge to ambulate the patient, administer toradol for muscle aches, ambulatory trial pending at time of signout. If pt cannot safely ambulate, could consider admission for continued observation, monitoring and rehydration. Signout given to Dr. Midge at 0000, likely DC pending ambulatory trial.     Final diagnoses:  Fever, unspecified fever cause  Lymphadenopathy  Elevated LFTs    ED Discharge Orders          Ordered    Ambulatory referral to Infectious Disease        01/29/24 2202    azithromycin  (ZITHROMAX ) 250 MG tablet  Daily        01/29/24 2205    ondansetron  (ZOFRAN -ODT) 4 MG disintegrating tablet  Every 8 hours PRN        01/29/24 2207                   Jerrol Agent, MD 01/30/24 0119

## 2024-01-29 NOTE — Discharge Instructions (Addendum)
 Your symptoms of fever, lymphadenopathy, elevated LFTs could be due to a virus however in the setting of your recent cat scratches could be due to cat scratch fever.  Your workup was overall reassuring in the emergency department.  You were rehydrated with fluids, provided with nausea medicine and pain medicine and you were given a single dose of antibiotics.  Your case was discussed with on-call infectious disease who agreed with the plan of care and recommended additional lab tests which will be available on the patient portal and will not immediately result tonight.  It is recommended that you schedule a follow-up appointment with infectious disease and your primary care provider.  Return to the ER for any severe worsening symptoms.  While it is remotely possible that your lymphadenopathy and fever could be due to an underlying cancerous process, it is more likely that it is due to an acute infection and the on-call oncologist agreed. Azithromycin  has been prescribed as has Zofran .  Continue to orally rehydrate outpatient.

## 2024-01-30 LAB — RPR: RPR Ser Ql: NONREACTIVE

## 2024-01-30 LAB — RESPIRATORY PANEL BY PCR

## 2024-01-31 ENCOUNTER — Ambulatory Visit (HOSPITAL_COMMUNITY): Payer: Self-pay

## 2024-01-31 LAB — CMV IGM: CMV IgM: <30 [AU]/ml (ref 0.0–29.9)

## 2024-01-31 LAB — BARTONELLA ANTIBODY PANEL
B Quintana IgM: NEGATIVE {titer}
B henselae IgG: NEGATIVE {titer}
B henselae IgM: NEGATIVE {titer}
B quintana IgG: NEGATIVE {titer}

## 2024-01-31 LAB — CMV ANTIBODY, IGG (EIA): CMV Ab - IgG: 9.5 U/mL — ABNORMAL HIGH (ref 0.00–0.59)

## 2024-02-02 LAB — MISC LABCORP TEST (SEND OUT): Labcorp test code: 138350

## 2024-02-04 ENCOUNTER — Ambulatory Visit: Admitting: Family Medicine

## 2024-02-04 ENCOUNTER — Encounter: Payer: Self-pay | Admitting: Medical Oncology

## 2024-02-04 ENCOUNTER — Encounter: Payer: Self-pay | Admitting: Family Medicine

## 2024-02-04 VITALS — BP 118/74 | HR 76 | Temp 97.9°F | Ht 64.0 in | Wt 314.0 lb

## 2024-02-04 DIAGNOSIS — R5383 Other fatigue: Secondary | ICD-10-CM

## 2024-02-04 DIAGNOSIS — R748 Abnormal levels of other serum enzymes: Secondary | ICD-10-CM

## 2024-02-04 DIAGNOSIS — R935 Abnormal findings on diagnostic imaging of other abdominal regions, including retroperitoneum: Secondary | ICD-10-CM

## 2024-02-04 DIAGNOSIS — Z09 Encounter for follow-up examination after completed treatment for conditions other than malignant neoplasm: Secondary | ICD-10-CM

## 2024-02-04 LAB — CBC WITH DIFFERENTIAL/PLATELET
Basophils Absolute: 0.1 K/uL (ref 0.0–0.1)
Basophils Relative: 0.9 % (ref 0.0–3.0)
Eosinophils Absolute: 1.5 K/uL — ABNORMAL HIGH (ref 0.0–0.7)
Eosinophils Relative: 18.1 % — ABNORMAL HIGH (ref 0.0–5.0)
HCT: 38.3 % (ref 36.0–46.0)
Hemoglobin: 13.2 g/dL (ref 12.0–15.0)
Lymphocytes Relative: 28 % (ref 12.0–46.0)
Lymphs Abs: 2.4 K/uL (ref 0.7–4.0)
MCHC: 34.4 g/dL (ref 30.0–36.0)
MCV: 91.5 fl (ref 78.0–100.0)
Monocytes Absolute: 0.4 K/uL (ref 0.1–1.0)
Monocytes Relative: 4.2 % (ref 3.0–12.0)
Neutro Abs: 4.1 K/uL (ref 1.4–7.7)
Neutrophils Relative %: 48.8 % (ref 43.0–77.0)
Platelets: 246 K/uL (ref 150.0–400.0)
RBC: 4.19 Mil/uL (ref 3.87–5.11)
RDW: 14 % (ref 11.5–15.5)
WBC: 8.5 K/uL (ref 4.0–10.5)

## 2024-02-04 LAB — COMPREHENSIVE METABOLIC PANEL WITH GFR
ALT: 76 U/L — ABNORMAL HIGH (ref 0–35)
AST: 51 U/L — ABNORMAL HIGH (ref 0–37)
Albumin: 3.7 g/dL (ref 3.5–5.2)
Alkaline Phosphatase: 283 U/L — ABNORMAL HIGH (ref 39–117)
BUN: 24 mg/dL — ABNORMAL HIGH (ref 6–23)
CO2: 27 meq/L (ref 19–32)
Calcium: 11 mg/dL — ABNORMAL HIGH (ref 8.4–10.5)
Chloride: 107 meq/L (ref 96–112)
Creatinine, Ser: 1.71 mg/dL — ABNORMAL HIGH (ref 0.40–1.20)
GFR: 34.65 mL/min — ABNORMAL LOW (ref >60.00)
Glucose, Bld: 131 mg/dL — ABNORMAL HIGH (ref 70–99)
Potassium: 3.9 meq/L (ref 3.5–5.1)
Sodium: 138 meq/L (ref 135–145)
Total Bilirubin: 0.7 mg/dL (ref 0.2–1.2)
Total Protein: 7.1 g/dL (ref 6.0–8.3)

## 2024-02-04 LAB — CULTURE, BLOOD (ROUTINE X 2)
Culture: NO GROWTH
Culture: NO GROWTH
Special Requests: ADEQUATE
Special Requests: ADEQUATE

## 2024-02-04 NOTE — Progress Notes (Unsigned)
 Established Patient Office Visit   Subjective:  Patient ID: Melanie [name], female    DOB: 1975/06/27  Age: 48 y.o. MRN: [id number]  Chief Complaint  Patient presents with   Hospitalization Follow-up    HPI Patient is present for a hospital follow up. Patient was seen at [facility] on 01/24/2024 for generalized body aches, fatigue, fever, back pain, nonproductive cough, and tingling-electric feeling throughout her body. Also, noted darker urine than usual.   Patients liver enzymes were slightly elevated, CT showed splenomegaly. Monoscreen was negative and EBV IgM was negative, while IgG was positive. No signs of bacterial infection found and thought it was likely a viral infection, such as mono causing her elevated LFTs. Recommend antipyretics and follow up with PCP.   Patient seen again at [facility] ED at [facility] on 01/29/2024 for fever, electrical zaps, and generalized body aches. CBC without leukocytosis or anemia. CMP with creatinine close to baseline at 1.56, LFTs elevated similar to prior with AST of 100, ALT of 120, alkaline phosphatase elevated at 369. Total bilirubin was normal.   CT abd pelvis with contrast showed 1. Multiple enlarged periportal lymph nodes, increased in size when compared to the prior study. 2. Stable pelvic and marked severity right inguinal lymphadenopathy. Further evaluation with a nuclear medicine PET/CT is recommended to exclude the presence of an underlying neoplastic process. 3. Evidence of prior gastric surgery.  Dr. Autumn with oncology was consulted. He agreed with a course of Azithromycin  and follow up with PCP. If persistent symptoms then outpatient referral to oncology. Dr. Overton with infectious disease was consulted with recommending additional labs. CMV antibody IgG was positive with negative IgM. Discharged patient to follow up with PCP.   She reports she is still feeling tired, generalized weakness, and lightheadedness. She reports this  is the first time she has went back to work this morning, but spouse has drove her due to still be lightheaded. Does not have the electric shock as described in the ED visits, but is expecting it to occur. Also, still having left flank pain with taking Naproxen BID  some that helps with the pain. In the last few days, decreased dose to not needing it has much. Urine color is looking a lot better. Appetite is increasing some. Trying to stay hydrated with water. About 45oz of water day, and juice. Uncertain if she has had fevers since she has been taking Naproxen, but she still gets cold sweats.   ROS See HPI above     Objective:   BP 118/74   Pulse 76   Temp 97.9 F (36.6 C) (Oral)   Ht 5' 4 (1.626 m)   Wt (!) 314 lb (142.4 kg)   SpO2 96%   BMI 53.90 kg/m    Physical Exam Vitals reviewed.  Constitutional:      General: She is not in acute distress.    Appearance: Normal appearance. She is obese. She is not ill-appearing, toxic-appearing or diaphoretic.  HENT:     Head: Normocephalic and atraumatic.  Eyes:     General:        Right eye: No discharge.        Left eye: No discharge.     Conjunctiva/sclera: Conjunctivae normal.  Cardiovascular:     Rate and Rhythm: Normal rate and regular rhythm.     Heart sounds: Normal heart sounds. No murmur heard.    No friction rub. No gallop.  Pulmonary:     Effort: Pulmonary  effort is normal. No respiratory distress.     Breath sounds: Normal breath sounds.  Abdominal:     General: Bowel sounds are normal. There is no distension.     Palpations: Abdomen is soft. There is no mass.     Tenderness: There is abdominal tenderness.  Musculoskeletal:        General: Normal range of motion.  Skin:    General: Skin is warm and dry.  Neurological:     General: No focal deficit present.     Mental Status: She is alert and oriented to person, place, and time. Mental status is at baseline.  Psychiatric:        Mood and Affect: Mood normal.         Behavior: Behavior normal.        Thought Content: Thought content normal.        Judgment: Judgment normal.      Assessment & Plan:  Hospital discharge follow-up  Fatigue, unspecified type -     CBC with Differential/Platelet -     Comprehensive metabolic panel with GFR -     Ambulatory referral to Hematology / Oncology  Abnormal CT of the abdomen -     CBC with Differential/Platelet -     Comprehensive metabolic panel with GFR -     Ambulatory referral to Hematology / Oncology  Elevated liver enzymes -     Comprehensive metabolic panel with GFR  -Ordered CBC and CMP due abnormal findings from recent emergency department visit with continued symptoms. Office will call with lab results and will be available via MyChart. -Placed an urgent referral to oncology with continued symptoms, not much improvement with taking Azithromycin  as directed by Dr. Autumn from last ED visit.  -Recommend to stay hydrated with water, 64-100oz of water a day.  -Will provide work excuse note to be out the rest of the week. Will take it week by week if needed to be out. Please provide FMLA forms to be completed due to being out of work with this illness.  -If symptoms were to become worse, go directly back to the closes emergency department. -Follow up in 2 weeks.   Return in about 2 weeks (around 02/18/2024) for follow-up.   Mahathi Pokorney, NP

## 2024-02-05 ENCOUNTER — Ambulatory Visit: Admitting: Orthopedic Surgery

## 2024-02-05 ENCOUNTER — Ambulatory Visit: Payer: Self-pay | Admitting: Family Medicine

## 2024-02-05 DIAGNOSIS — R898 Other abnormal findings in specimens from other organs, systems and tissues: Secondary | ICD-10-CM

## 2024-02-05 NOTE — Patient Instructions (Addendum)
-  It was a pleasure to see you.  -Ordered CBC and CMP due abnormal findings from recent emergency department visit with continued symptoms. Office will call with lab results and will be available via MyChart. -Placed an urgent referral to oncology with continued symptoms, not much improvement with taking Azithromycin  as directed by Dr. Autumn from last ED visit.  -Recommend to stay hydrated with water, 64-100oz of water a day.  -Will provide work excuse note to be out the rest of the week. Will take it week by week if needed to be out. Please provide FMLA forms to be completed due to being out of work with this illness.  -If symptoms were to become worse, go directly back to the closes emergency department. -Follow up in 2 weeks.

## 2024-02-05 NOTE — Progress Notes (Unsigned)
 [facility] [phone]   [phone]  INITIAL CONSULTATION:  Patient Care Team: Billy Philippe SAUNDERS, NP as PCP - General (Family Medicine) Mona Vinie BROCKS, MD as PCP - Cardiology (Cardiology) Buck Saucer, MD as Attending Physician (Neurology) [facility], [facility] Leonetti, Forestine BROCKS, RN as Oncology Nurse Navigator (Medical Oncology)  CHIEF COMPLAINTS/PURPOSE OF CONSULTATION:  Lymphadenopathy   HISTORY OF PRESENTING ILLNESS:  Melanie [name] 48 y.o. female with medical history significant for hypertension, obstructive sleep apnea, hepatic fibrosis, NASH, liver fibrosis, hypothyroid, depression with anxiety, prediabetes, hyperlipidemia, vitamin D  deficiency.  On review of the previous records patient was referred by PCP Philippe Billy, NP.   Patient was seen by PCP on 11/24 for hospital follow-up.  Patient has had 2 recent ED visits on 01/24/2024 and 01/29/2024.  The first ED visit on 01/24/2024 was for evaluation of generalized bodyaches, fatigue and tingling sensation.  Patient had CT renal stone study during that visit revealed several round top-normal and mildly enlarged upper abdominal and gastrohepatic lymph nodes measuring up to 12 mm that radiologist commented were similar to CT findings on 01/16/2019.  CT scan did also show splenomegaly measuring 17 cm.  Lab work showed mild thrombocytopenia otherwise normal CBC, negative mononucleosis screening, Epstein bar virus VCA IgG was elevated however Epstein-Barr virus VCA IgM was negative indicating no acute infection, hepatitis panel was negative, CMP showing elevated liver enzymes, AST 115 and ALT 131 and alk phos 206.  EDP documented suspected viral infection and recommended PCP follow-up.  At patient's second ED visit on 01/29/2024 she was evaluated for fatigue, generalized bodyaches and fevers with Tmax of 101.  Infectious disease was consulted and recommended lab  work.  Lab work at that visit included a negative respiratory PCR panel, UA without infection, blood cultures, negative rapid HIV testing, nonreactive RPR, negative Bartonella antibody panel, negative CMV IgM, positive CMV AB IgG.  CMP showing liver enzymes were trending down however alkaline phosphatase remained elevated at 396.  CT abdomen and pelvis showing multiple enlarged periportal lymph nodes increasing in size when compared to CT x 5 days prior.  Largest lymph node was measured at 3.5 cm.  Also stable pelvic and marked severity right inguinal lymphadenopathy.  Patient then had PCP follow-up on 02/04/24.  At that visit patient reported she was still feeling symptomatic.  CBC collected showing normal WBC and lymphocytes.  Relative eosinophils were elevated at 18.1 and absolute eosinophils at 1.5.  CMP showing liver enzymes and alkaline phos were trending down. Further chart review shows CT AP on 01/16/2019 showing moderate hepatosplenomegaly, several mildly enlarged periportal and portacaval lymph nodes measuring up to 1.3 cm in short axis, likely reactive.  On exam today patient is accompanied by her spouse who provides additional details.  Patient reports her symptoms started 01/15/2022. She experienced generalized tiredness, weakness, and a sensation described as 'lightning' running through her body, generalized malaise, nausea, fever up to 101F, and significant fatigue. The fever started around her first hospital visit and lasted about a week. She continues to experience fatigue, weakness, and lightheadedness, even while sitting. She denies any enlarged lymph nodes. She describes body aches that were initially severe but have since subsided. The pain localized to her left lower back and around the front, which has improved from a 7-8/10 to a 1-2/10 after antibiotic treatment. Patient was treated with a z-pak for possibility of cat scratch disease as she had cat scratches on her legs while the Bartonella  panel was in process. The pain is worse when she is still.  She has experienced a decrease in appetite, with some days being hungrier than others. She reports a weight loss of 10-15 pounds over the last two weeks, which she attributes to reduced food intake and a drop in caffeine consumption. She also notes cold chills, cold sweats, and night sweats. She has a history of night sweats that are currently unchanged. She also takes rosuvastatin  for high cholesterol, which she started about two months ago. Previous statins elevated her liver enzymes, but she has not experienced muscle aches with them, so far tolerating rosuvastatin  well she thinks.  Her family history is significant for cancer; her mother had colon cancer and her father had skin cancer that progressed to leptomeningeal disease. Both parents passed away from cancer-related causes. She denies any history of smoking or alcohol use. She works in the adult midwife at [employer] Her last colonoscopy and mammogram were in 2024 and were normal per patient. Last PAP smear was in 2020 and also was normal.  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    Migraines    LIGHT SENSITIVITY , NOW ON IMITREX  SINCE 08-02-2018,NOTICES IMPROVEMENT    Multiple food allergies    Muscle pain    Numbness and tingling in both hands    Pre-diabetes    Seizures (HCC)    at age 76 fever seizure only had one single seizure   Shingles    dx 11/23/22,completed antiviral   Shortness of breath    was due to a medication side effect, now off medication , SOB resolved    Sleep apnea    CPAP   Stress    Swelling  of both lower extremities     SURGICAL HISTORY: Past Surgical History:  Procedure Laterality Date   ELBOW LIGAMENT RECONSTRUCTION Right 08/21/2018   Procedure: LATERAL COLLATERAL LIGAMENT REPAIR;  Surgeon: Cristy Bonner DASEN, MD;  Location: [facility];  Service: Orthopedics;  Laterality: Right;   FINGER SURGERY Left     index finger   KNEE SURGERY Left    LAPAROSCOPIC GASTRIC SLEEVE RESECTION N/A 05/31/2020   Procedure: LAPAROSCOPIC GASTRIC SLEEVE RESECTION;  Surgeon: Stevie, Herlene Righter, MD;  Location: [facility];  Service: General;  Laterality: N/A;  2 HOURS TOTAL ROOM 1   LIVER BIOPSY N/A 05/15/2014   Procedure: LIVER BIOPSY;  Surgeon: Lamar JONETTA Aho, MD;  Location: [facility];  Service: Endoscopy;  Laterality: N/A;  ultrasound to mark the liver   RENAL BIOPSY     20 years ago,updated 12/06/22   TENNIS ELBOW RELEASE/NIRSCHEL PROCEDURE Right 08/21/2018   Procedure: TENNIS ELBOW RELEASE/NIRSCHEL PROCEDURE;  Surgeon: Cristy Bonner DASEN, MD;  Location: [facility];  Service: Orthopedics;  Laterality: Right;   TONSILLECTOMY AND ADENOIDECTOMY  1978   TYMPANOSTOMY TUBE PLACEMENT     age 1   ULNAR NERVE TRANSPOSITION Right 08/21/2018   Procedure: ULNAR NERVE DECOMPRESSION/TRANSPOSITION;  Surgeon: Cristy Bonner DASEN, MD;  Location: [facility];  Service: Orthopedics;  Laterality: Right;   UPPER GI ENDOSCOPY N/A 05/31/2020   Procedure: UPPER GI ENDOSCOPY;  Surgeon: Stevie, Herlene Righter, MD;  Location: [facility];  Service: General;  Laterality: N/A;   wrist cyst Left     SOCIAL HISTORY: Social History   Socioeconomic History   Marital status: Married    Spouse name: [name]   Number of children: 5   Years of education: in [employer] program   Highest education level: Bachelor's degree (e.g., BA, AB, BS)  Occupational History   Occupation: Magazine Features Editor: [employer]    Comment: ESL  Tobacco Use   Smoking status: Never   Smokeless tobacco: Never  Vaping Use   Vaping status: Never Used  Substance and  Sexual Activity   Alcohol use: No    Alcohol/week: 0.0 standard drinks of alcohol   Drug use: No   Sexual activity: Not Currently    Partners: Male    Birth control/protection: None    Comment: 1st intercourse 28yo-1 partner  Other Topics Concern   Not on file  Social History Narrative   Lives with her husband and their 5 adpoted children.    Adopted 2 sets of siblings, all with psychiatric diagnoses (ADHD, Asperger's, ODD, PTSD, depression, bipolar disorder).   Her husband has Asperger's, and works in disability services.   Occasionally drinks coke or pepsi    Social Drivers of Health   Financial Resource Strain: Low Risk  (07/21/2022)   Overall Financial Resource Strain (CARDIA)    Difficulty of Paying Living Expenses: Not hard at all  Food Insecurity: No Food Insecurity (02/06/2024)   Hunger Vital Sign    Worried About Running Out of Food in the Last Year: Never true    Ran Out of Food in the Last Year: Never true  Transportation Needs: No Transportation Needs (02/06/2024)   PRAPARE - Administrator, Civil Service (Medical): No    Lack of Transportation (Non-Medical): No  Physical Activity: Insufficiently Active (07/21/2022)   Exercise Vital Sign    Days of Exercise per Week: 1 day    Minutes of Exercise per Session: 20 min  Stress: Stress Concern Present (07/21/2022)   [employer] of Occupational Health - Occupational Stress Questionnaire    Feeling of Stress : To some extent  Social Connections: Socially Integrated (07/21/2022)   Social Connection and Isolation Panel    Frequency of Communication with Friends and Family: Three times a week    Frequency of Social Gatherings with Friends and Family: Once a week    Attends Religious Services: More than 4 times per year    Active Member of [employer] or Organizations: Yes    Attends Engineer, Structural: More than 4 times per year    Marital Status: Married  Catering Manager Violence: Not At Risk  (07/21/2022)   Humiliation, Afraid, Rape, and Kick questionnaire    Fear of Current or Ex-Partner: No    Emotionally Abused: No    Physically Abused: No    Sexually Abused: No    FAMILY HISTORY: Family History  Problem Relation Age of Onset   Colon polyps Mother    Colon cancer Mother 58   Diabetes Mother    Liver disease Mother        ESLD- unknown etiology   Obesity Mother    Kidney disease Mother    Hyperlipidemia Father    Melanoma Father    Colon polyps Sister  Migraines Sister    Sleep apnea Sister    Bipolar disorder Brother    COPD Paternal Aunt    COPD Paternal Aunt    Diabetes Maternal Grandmother    Stroke Maternal Grandfather    Diabetes Paternal Grandmother    Breast cancer Paternal Grandmother    Crohn's disease Neg Hx    Esophageal cancer Neg Hx    Stomach cancer Neg Hx    Rectal cancer Neg Hx    Ulcerative colitis Neg Hx     ALLERGIES:  is allergic to welchol  [colesevelam ], lidocaine , pistachio nut (diagnostic), relafen [nabumetone], celebrex [celecoxib], and sulfa antibiotics.  MEDICATIONS:  Current Outpatient Medications  Medication Sig Dispense Refill   buPROPion  (WELLBUTRIN  SR) 150 MG 12 hr tablet Take 1 tablet (150 mg total) by mouth 2 (two) times daily. 180 tablet 1   busPIRone  (BUSPAR ) 10 MG tablet Take 1 tablet (10 mg total) by mouth 2 (two) times daily. 180 tablet 0   busPIRone  (BUSPAR ) 15 MG tablet Take one tablet twice a day     Calcium  Carbonate (CALCIUM  500 PO) Take 1 tablet by mouth daily.     cetirizine (ZYRTEC) 10 MG tablet Take 10 mg by mouth at bedtime.      Cholecalciferol (VITAMIN D3) 1.25 MG (50000 UT) CAPS Take 1 capsule (1.25 mg total) by mouth every 7 (seven) days. 12 capsule 0   citalopram  (CELEXA ) 20 MG tablet Take 1 tablet (20 mg total) by mouth daily. 90 tablet 1   citalopram  (CELEXA ) 40 MG tablet Take 1 tablet (40 mg total) by mouth daily. 90 tablet 0   Evolocumab  (REPATHA  SURECLICK) 140 MG/ML SOAJ Inject 1 pen every 14  days, must schedule OV for further refills. 6 mL 0   FARXIGA 10 MG TABS tablet Take 10 mg by mouth daily.     Fish Oil-Cholecalciferol (OMEGA-3 + VITAMIN D3 PO) Take 1 tablet by mouth daily. 4,000IU of vitamin D3 115mg  of Omega-3     fluticasone  (FLONASE ) 50 MCG/ACT nasal spray Place 2 sprays into both nostrils as needed. 18.2 mL 1   levothyroxine  (SYNTHROID ) 88 MCG tablet TAKE ONE TABLET BY MOUTH DAILY BEFORE BREAKFAST 90 tablet 1   lisinopril  (ZESTRIL ) 20 MG tablet Take 1 tablet (20 mg total) by mouth daily. 90 tablet 1   LORazepam  (ATIVAN ) 0.5 MG tablet Take 1 tablet (0.5 mg total) by mouth 2 (two) times daily as needed for anxiety. 30 tablet 0   montelukast  (SINGULAIR ) 10 MG tablet Take 1 tablet (10 mg total) by mouth at bedtime. 90 tablet 0   Multiple Vitamin (MULTIVITAMIN WITH MINERALS) TABS tablet Take 1 tablet by mouth daily.     ondansetron  (ZOFRAN -ODT) 4 MG disintegrating tablet Take 1 tablet (4 mg total) by mouth every 8 (eight) hours as needed. 20 tablet 0   Polyethyl Glycol-Propyl Glycol (LUBRICANT EYE DROPS) 0.4-0.3 % SOLN Place 1 drop into both eyes 3 (three) times daily as needed (dry/irritated eyes).     rosuvastatin  (CRESTOR ) 10 MG tablet Take 1 tablet (10 mg total) by mouth daily. 90 tablet 3   topiramate  (TOPAMAX ) 100 MG tablet Take 1 tablet (100 mg total) by mouth 2 (two) times daily. *NEEDS APPOINTMENT FOR FURTHER REFILLS* 120 tablet 0   Ubrogepant  (UBRELVY ) 100 MG TABS Take 1 tablet (100 mg total) by mouth as needed. May repeat in 2 hours if needed, no more than 2 pills in 24 hours. Must be seen for further refills. 10 tablet 1   No  current facility-administered medications for this visit.    REVIEW OF SYSTEMS:   All other systems are reviewed and are negative for acute change except as noted in the HPI.  PHYSICAL EXAMINATION: ECOG PERFORMANCE STATUS: 1 - Symptomatic but completely ambulatory  Vitals:   02/06/24 1241  BP: 124/73  Pulse: 70  Resp: 13  Temp: 97.7 F  (36.5 C)  SpO2: 97%   Filed Weights   02/06/24 1241  Weight: (!) 311 lb 1.6 oz (141.1 kg)    Physical Exam Vitals reviewed.  Constitutional:      Appearance: She is not ill-appearing or toxic-appearing.  HENT:     Head: Normocephalic.     Nose: Nose normal.     Mouth/Throat:     Mouth: Mucous membranes are moist.  Eyes:     General: No scleral icterus.    Conjunctiva/sclera: Conjunctivae normal.  Cardiovascular:     Rate and Rhythm: Normal rate and regular rhythm.     Pulses: Normal pulses.     Heart sounds: Normal heart sounds.  Pulmonary:     Effort: Pulmonary effort is normal.     Breath sounds: Normal breath sounds.  Abdominal:     General: There is no distension.     Palpations: Abdomen is soft.     Tenderness: There is abdominal tenderness. There is no guarding.     Comments: LLQ tenderness without peritoneal signs  Musculoskeletal:        General: Normal range of motion.     Cervical back: Normal range of motion.  Lymphadenopathy:     Head:     Right side of head: No occipital adenopathy.     Left side of head: No occipital adenopathy.     Cervical: No cervical adenopathy.     Upper Body:     Right upper body: No supraclavicular, axillary, pectoral or epitrochlear adenopathy.     Left upper body: No supraclavicular, axillary, pectoral or epitrochlear adenopathy.     Lower Body: No right inguinal adenopathy. No left inguinal adenopathy.  Skin:    General: Skin is warm and dry.  Neurological:     General: No focal deficit present.  Psychiatric:        Mood and Affect: Mood normal.       LABORATORY DATA:  I have reviewed the data as listed    Latest Ref Rng & Units 02/06/2024    2:11 PM 02/04/2024    2:37 PM 01/29/2024    6:10 PM  CBC  WBC 4.0 - 10.5 K/uL 9.1  8.5  7.9   Hemoglobin 12.0 - 15.0 g/dL 86.4  86.7  85.7   Hematocrit 36.0 - 46.0 % 39.4  38.3  41.1   Platelets 150 - 400 K/uL 243  246.0  158        Latest Ref Rng & Units 02/06/2024     2:11 PM 02/04/2024    2:37 PM 01/29/2024    6:10 PM  CMP  Glucose 70 - 99 mg/dL 95  868  891   BUN 6 - 20 mg/dL 20  24  24    Creatinine 0.44 - 1.00 mg/dL 8.58  8.28  8.43   Sodium 135 - 145 mmol/L 140  138  135   Potassium 3.5 - 5.1 mmol/L 4.2  3.9  4.2   Chloride 98 - 111 mmol/L 108  107  101   CO2 22 - 32 mmol/L 23  27  24    Calcium  8.9 -  10.3 mg/dL 88.7  88.9  88.6   Total Protein 6.5 - 8.1 g/dL 7.4  7.1  7.1   Total Bilirubin 0.0 - 1.2 mg/dL 0.6  0.7  0.9   Alkaline Phos 38 - 126 U/L 296  283  369   AST 15 - 41 U/L 56  51  100   ALT 0 - 44 U/L 81  76  120      RADIOGRAPHIC STUDIES: I have personally reviewed the radiological images as listed and agreed with the findings in the report. CT ABDOMEN PELVIS W CONTRAST Result Date: 01/29/2024 CLINICAL DATA:  Persistent fatigue, neck tingling and headache. EXAM: CT ABDOMEN AND PELVIS WITH CONTRAST TECHNIQUE: Multidetector CT imaging of the abdomen and pelvis was performed using the standard protocol following bolus administration of intravenous contrast. RADIATION DOSE REDUCTION: This exam was performed according to the departmental dose-optimization program which includes automated exposure control, adjustment of the mA and/or kV according to patient size and/or use of iterative reconstruction technique. CONTRAST:  OMNIPAQUE  IOHEXOL  300 MG/ML  SOLN COMPARISON:  January 24, 2024 FINDINGS: Lower chest: Mild linear atelectasis is seen within the right lung base. Hepatobiliary: No focal liver abnormality is seen. No gallstones, gallbladder wall thickening, or biliary dilatation. Pancreas: Unremarkable. No pancreatic ductal dilatation or surrounding inflammatory changes. Spleen: There is mild to moderate severity splenomegaly. Adrenals/Urinary Tract: Adrenal glands are unremarkable. Kidneys are normal, without renal calculi, focal lesion, or hydronephrosis. Bladder is unremarkable. Stomach/Bowel: Surgical sutures are seen throughout the  gastric region. Appendix appears normal. No evidence of bowel wall thickening, distention, or inflammatory changes. Vascular/Lymphatic: No significant vascular findings are present. Multiple enlarged periportal lymph nodes are seen. These are increased in size when compared to the prior study. The largest measures approximately 3.5 cm. Stable subcentimeter para-aortic and aortocaval lymph nodes are noted. Enlarged, predominantly stable pelvic lymph nodes are seen along the right iliac vessels (the largest measures approximately 2.4 cm). There is stable marked severity right inguinal lymphadenopathy. Reproductive: Uterus and bilateral adnexa are unremarkable. Other: No abdominal wall hernia or abnormality. No abdominopelvic ascites. Musculoskeletal: No acute or significant osseous findings. IMPRESSION: 1. Multiple enlarged periportal lymph nodes, increased in size when compared to the prior study. 2. Stable pelvic and marked severity right inguinal lymphadenopathy. Further evaluation with a nuclear medicine PET/CT is recommended to exclude the presence of an underlying neoplastic process. 3. Evidence of prior gastric surgery. Electronically Signed   By: Suzen Dials M.D.   On: 01/29/2024 21:26   US  Abdomen Limited RUQ (LIVER/GB) Result Date: 01/24/2024 EXAM: Right Upper Quadrant Abdominal Ultrasound 01/24/2024 10:12:27 PM TECHNIQUE: Real-time ultrasonography of the right upper quadrant of the abdomen was performed. COMPARISON: CT abdomen and pelvis earlier today. CLINICAL HISTORY: RUQ pain. FINDINGS: LIVER: Hyperechoic hepatic parenchyma, suggesting hepatic steatosis. No intrahepatic biliary ductal dilatation. No evidence of mass. BILIARY SYSTEM: No pericholecystic fluid or wall thickening. No cholelithiasis. Negative sonographic Murphy's sign. Common bile duct measures 2 mm. OTHER: No right upper quadrant ascites. IMPRESSION: 1. No acute findings. 2. Hepatic steatosis. Electronically signed by: Pinkie Pebbles MD 01/24/2024 10:16 PM EST RP Workstation: [id number]   DG Chest 2 View Result Date: 01/24/2024 EXAM: 2 VIEW(S) XRAY OF THE CHEST 01/24/2024 09:21:17 PM COMPARISON: 07/11/2022 CLINICAL HISTORY: cough FINDINGS: LUNGS AND PLEURA: No focal infiltrate is noted. No pleural effusion. No pneumothorax. HEART AND MEDIASTINUM: No acute abnormality of the cardiac and mediastinal silhouettes. BONES AND SOFT TISSUES: No acute osseous abnormality. IMPRESSION: 1. No acute abnormality  noted. Electronically signed by: Oneil Devonshire MD 01/24/2024 09:25 PM EST RP Workstation: [id number]   CT Renal Stone Study Result Date: 01/24/2024 CLINICAL DATA:  Abdominal/flank pain. EXAM: CT ABDOMEN AND PELVIS WITHOUT CONTRAST TECHNIQUE: Multidetector CT imaging of the abdomen and pelvis was performed following the standard protocol without IV contrast. RADIATION DOSE REDUCTION: This exam was performed according to the departmental dose-optimization program which includes automated exposure control, adjustment of the mA and/or kV according to patient size and/or use of iterative reconstruction technique. COMPARISON:  Renal ultrasound dated 12/01/2022. Abdominal CT dated 12/28/2022. FINDINGS: Evaluation of this exam is limited in the absence of intravenous contrast. Lower chest: The visualized lung bases are clear. No intra-abdominal free air or free fluid. Hepatobiliary: The liver is unremarkable. No biliary dilatation. The gallbladder is unremarkable. Pancreas: Unremarkable. No pancreatic ductal dilatation or surrounding inflammatory changes. Spleen: Splenomegaly measuring 17 cm. Adrenals/Urinary Tract: The adrenal glands unremarkable. Mild bilateral renal parenchyma atrophy. There is a punctate nonobstructing right renal interpolar calculus. No hydronephrosis or obstructing stone on either side. The visualized ureters and urinary bladder unremarkable. Stomach/Bowel: Postsurgical changes of gastric sleeve. There is moderate stool  throughout the colon. There is no bowel obstruction or active inflammation. The appendix is normal. Vascular/Lymphatic: The abdominal aorta and IVC are grossly unremarkable on this noncontrast CT. No portal venous gas. Several rounded top-normal and mildly enlarged upper abdominal and gastrohepatic lymph nodes measure up to 12 mm of indeterminate etiology or clinical significance. Similar findings were seen on CT of 01/16/2019. Reproductive: The uterus is grossly unremarkable. No suspicious adnexal masses. Other: None Musculoskeletal: No acute or significant osseous findings. IMPRESSION: 1. No acute intra-abdominal or pelvic pathology. 2. Punctate nonobstructing right renal interpolar calculus. No hydronephrosis or obstructing stone. 3. Splenomegaly. 4. Postsurgical changes of gastric sleeve. No bowel obstruction. Normal appendix. Electronically Signed   By: Vanetta Chou M.D.   On: 01/24/2024 19:58    ASSESSMENT & PLAN JOCI DRESS is a 48 y.o. female presenting to [facility] for consultation regarding lymphadenopathy. Patient will proceed with laboratory workup today.   #Lymphadenopathy - Reviewed ED work up x 2 and recent PCP visit with patient and spouse. - Discussed ddx includes infectious process, inflammatory process, autoimmune disease, lymphoproliferative disorder or metastatic disease. - No palpable LAD on exam. - Labs today to check CBC, CMP, LDH, flow cytometry, and CRP levels. Patient already had HIV testing, hepatitis panel and Bartonella testing. PCP ordered ANA, ESR, rheumatoid factor, and cyclic citrul peptide antibody IgG testing which patient has drawn PTA today. - PET scan ordered to evaluate FDG activity of lymphadenopathy. Results of labs and PET scans will determine if biopsy is needed/biopsy site.   #Age related screenings - Missed mammogram in 2025, plans to have one in 2026. Otherwise UTD.  -Patient will RTC when work up is complete.  Patient expressed  understanding of the recommended workup and is agreeable to move forward.   All questions were answered. The patient knows to call the clinic with any problems, questions or concerns.  Shared visit with Dr. Federico.  Orders Placed This Encounter  Procedures   NM PET Image Initial (PI) Skull Base To Thigh    [facility]    Standing Status:   Future    Expected Date:   02/07/2024    Expiration Date:   02/05/2025    If indicated for the ordered procedure, I authorize the administration of a radiopharmaceutical per Radiology protocol:   Yes  Is the patient pregnant?:   No    Preferred imaging location?:   [place]   CBC with Differential (Cancer Center Only)   CMP (Cancer Center only)   C-reactive protein   Flow Cytometry, Peripheral Blood (Oncology)   Lactate dehydrogenase      I have spent a total of 60 minutes minutes of face-to-face and non-face-to-face time, preparing to see the patient, obtaining and/or reviewing separately obtained history, performing a medically appropriate examination, counseling and educating the patient, ordering medications/tests/procedures, referring and communicating with other health care professionals, documenting clinical information in the electronic health record, independently interpreting results and communicating results to the patient, and care coordination.   Rakia Frayne Walisiewicz PA-C Department of Hematology/Oncology [facility] at [facility] Phone: [phone]  I have read the above note and personally examined the patient. I agree with the assessment and plan as noted above.  Briefly Mrs. Erianna Jolly a 48 year old female who presents for evaluation of multiple enlarged periportal lymph nodes.  Today we discussed the possible etiologies of lymphadenopathy including infection, inflammation, and malignancy.  Given the multifocal lymph nodes would recommend pursuing a PET CT scan in order to evaluate these  further.  Additionally if these are FDG avid would recommend pursuing an excisional biopsy if feasible, or core biopsy in order to further evaluate.  Additionally we will perform full workup to include CRP, ESR, rheumatological workup, as well as repeat CBC and CMP.  The patient voiced understanding of our findings and recommendations moving forward.   Norleen IVAR Kidney, MD Department of Hematology/Oncology [facility] at [facility] Phone: [phone] Pager: [phone] Email: [email or web address]

## 2024-02-06 ENCOUNTER — Inpatient Hospital Stay: Attending: Physician Assistant | Admitting: Physician Assistant

## 2024-02-06 ENCOUNTER — Encounter: Payer: Self-pay | Admitting: Medical Oncology

## 2024-02-06 ENCOUNTER — Other Ambulatory Visit

## 2024-02-06 ENCOUNTER — Encounter: Payer: Self-pay | Admitting: Physician Assistant

## 2024-02-06 ENCOUNTER — Inpatient Hospital Stay

## 2024-02-06 VITALS — BP 124/73 | HR 70 | Temp 97.7°F | Resp 13 | Wt 311.1 lb

## 2024-02-06 DIAGNOSIS — R898 Other abnormal findings in specimens from other organs, systems and tissues: Secondary | ICD-10-CM

## 2024-02-06 DIAGNOSIS — R591 Generalized enlarged lymph nodes: Secondary | ICD-10-CM

## 2024-02-06 LAB — CBC WITH DIFFERENTIAL (CANCER CENTER ONLY)
Abs Immature Granulocytes: 0.43 K/uL — ABNORMAL HIGH (ref 0.00–0.07)
Basophils Absolute: 0.2 K/uL — ABNORMAL HIGH (ref 0.0–0.1)
Basophils Relative: 2 %
Eosinophils Absolute: 2 K/uL — ABNORMAL HIGH (ref 0.0–0.5)
Eosinophils Relative: 22 %
HCT: 39.4 % (ref 36.0–46.0)
Hemoglobin: 13.5 g/dL (ref 12.0–15.0)
Immature Granulocytes: 5 %
Lymphocytes Relative: 27 %
Lymphs Abs: 2.5 K/uL (ref 0.7–4.0)
MCH: 31.3 pg (ref 26.0–34.0)
MCHC: 34.3 g/dL (ref 30.0–36.0)
MCV: 91.2 fL (ref 80.0–100.0)
Monocytes Absolute: 0.5 K/uL (ref 0.1–1.0)
Monocytes Relative: 5 %
Neutro Abs: 3.5 K/uL (ref 1.7–7.7)
Neutrophils Relative %: 39 %
Platelet Count: 243 K/uL (ref 150–400)
RBC: 4.32 MIL/uL (ref 3.87–5.11)
RDW: 13.4 % (ref 11.5–15.5)
WBC Count: 9.1 K/uL (ref 4.0–10.5)
nRBC: 0 % (ref 0.0–0.2)

## 2024-02-06 LAB — CMP (CANCER CENTER ONLY)
ALT: 81 U/L — ABNORMAL HIGH (ref 0–44)
AST: 56 U/L — ABNORMAL HIGH (ref 15–41)
Albumin: 3.9 g/dL (ref 3.5–5.0)
Alkaline Phosphatase: 296 U/L — ABNORMAL HIGH (ref 38–126)
Anion gap: 8 (ref 5–15)
BUN: 20 mg/dL (ref 6–20)
CO2: 23 mmol/L (ref 22–32)
Calcium: 11.2 mg/dL — ABNORMAL HIGH (ref 8.9–10.3)
Chloride: 108 mmol/L (ref 98–111)
Creatinine: 1.41 mg/dL — ABNORMAL HIGH (ref 0.44–1.00)
GFR, Estimated: 46 mL/min — ABNORMAL LOW (ref >60)
Glucose, Bld: 95 mg/dL (ref 70–99)
Potassium: 4.2 mmol/L (ref 3.5–5.1)
Sodium: 140 mmol/L (ref 135–145)
Total Bilirubin: 0.6 mg/dL (ref 0.0–1.2)
Total Protein: 7.4 g/dL (ref 6.5–8.1)

## 2024-02-06 LAB — LACTATE DEHYDROGENASE: LDH: 258 U/L — ABNORMAL HIGH (ref 105–235)

## 2024-02-06 LAB — SEDIMENTATION RATE: Sed Rate: 22 mm/h — ABNORMAL HIGH (ref 0–20)

## 2024-02-06 LAB — C-REACTIVE PROTEIN: CRP: 0.9 mg/dL (ref <1.0)

## 2024-02-06 NOTE — Progress Notes (Signed)
 [facility]  Patient presented to clinic, with her husband Vickye, for her scheduled appointment with DEVONNA Gift. I introduced myself and provided them with my direct contact information. Patient and spouse were both encouraged to call me with any questions/concerns they may have.  Colene KYM Raider, RN, BSN, [facility] Oncology Nurse Navigator, [facility] 02/06/2024 1:54 PM

## 2024-02-06 NOTE — Patient Instructions (Addendum)
 Rapid [facility] Office Visit Discharge Information and Instructions  Thank you for choosing [facility] for your healthcare needs.  Below is a summary of today's discussion, along with our contact information and an outline of what to expect next.  Reason for Visit:  enlarged lymph nodes  Proposed Diagnostic Care Plan: Labs collected today PET scan is scheduled. - Please arrive to [facility] admissions at 3:30 -- Nothing by mouth 6 hrs priors to scan. Water is ok     What to Expect: - Generally, when lab tests are ordered the results can take up to 1 week for results to be available.  At that point, we will contact you to discuss your results with you.  Unless there is a critical result, we will typically wait for all of your lab results to be available before contacting you. - If a biopsy is part of your Care Plan, those results can take on average 7-10 days to result.  Once results are available, we will contact you to discuss your pathology results and any next steps. - If you have additional imaging ordered, such as a CT Scan, MRI, Ultrasound, Bone Scan, or PET scan, your imaging will need to be authorized then scheduled with the earliest available appointment.  You may be asked to travel to another hospital within [facility] who has a sooner availability, please consider doing so if asked. - If you use MyChart, your results will be available to you in the MyChart portal.  Your provider will be in touch with you as soon as all of your results are available to be discussed.  Your Diagnostic Clinic Provider:  Robet Crutchfield Walisiewicz PA-C and Dr. Federico Your Diagnostic Navigator:  Colene Raider RN, office number [phone]  If you or your caregiver have number blocking on your cell phones, please ensure the cancer center's numbers are not blocked.  If you are not a registered MyChart user, please consider enrolling in MyChart to receive your test results and visit notes.  You can also  access your discharge instructions electronically.  MyChart also gives you an electronic means to communicate with your Care Team instead of needing to call in to the cancer center.  We appreciate you trusting us  with your healthcare and look forward to partnering with you as we work to uncover what your potential diagnosis may be.  Please do not hesitate to reach out at any point with questions or concerns.

## 2024-02-09 LAB — ANTI-NUCLEAR AB-TITER (ANA TITER): ANA Titer 1: 1:40 {titer} — ABNORMAL HIGH

## 2024-02-09 LAB — ANA: Anti Nuclear Antibody (ANA): POSITIVE — AB

## 2024-02-09 LAB — CYCLIC CITRUL PEPTIDE ANTIBODY, IGG: Cyclic Citrullin Peptide Ab: <16 U

## 2024-02-09 LAB — RHEUMATOID FACTOR: Rheumatoid fact SerPl-aCnc: <10 [IU]/mL (ref <14)

## 2024-02-11 ENCOUNTER — Ambulatory Visit: Payer: Self-pay | Admitting: Physician Assistant

## 2024-02-11 LAB — FLOW CYTOMETRY

## 2024-02-11 LAB — SURGICAL PATHOLOGY

## 2024-02-12 ENCOUNTER — Ambulatory Visit: Payer: Self-pay | Admitting: Family Medicine

## 2024-02-12 DIAGNOSIS — R7689 Other specified abnormal immunological findings in serum: Secondary | ICD-10-CM

## 2024-02-13 ENCOUNTER — Encounter (HOSPITAL_COMMUNITY)
Admission: RE | Admit: 2024-02-13 | Discharge: 2024-02-13 | Disposition: A | Discharge status: Home / Self Care | Source: Ambulatory Visit | Attending: Physician Assistant | Admitting: Physician Assistant

## 2024-02-13 ENCOUNTER — Ambulatory Visit: Admitting: Clinical

## 2024-02-13 ENCOUNTER — Other Ambulatory Visit: Payer: Self-pay

## 2024-02-13 DIAGNOSIS — E038 Other specified hypothyroidism: Secondary | ICD-10-CM

## 2024-02-13 DIAGNOSIS — R161 Splenomegaly, not elsewhere classified: Secondary | ICD-10-CM | POA: Insufficient documentation

## 2024-02-13 DIAGNOSIS — R591 Generalized enlarged lymph nodes: Secondary | ICD-10-CM | POA: Diagnosis present

## 2024-02-13 DIAGNOSIS — F4323 Adjustment disorder with mixed anxiety and depressed mood: Secondary | ICD-10-CM

## 2024-02-13 LAB — GLUCOSE, CAPILLARY: Glucose-Capillary: 90 mg/dL (ref 70–99)

## 2024-02-13 MED ORDER — FLUDEOXYGLUCOSE F - 18 (FDG) INJECTION
16.0000 | Freq: Once | INTRAVENOUS | Status: AC
  Administered 2024-02-13: 15.43 via INTRAVENOUS

## 2024-02-13 MED ORDER — LEVOTHYROXINE SODIUM 88 MCG PO TABS: ORAL_TABLET | ORAL | 1 refills | Status: AC

## 2024-02-13 NOTE — Progress Notes (Signed)
 Behavioral Health Treatment Plan   Name:Florina A Szymczak  Insurance: [employer]  MRN: [id number]   Treatment Plan Development Date: 02/13/2024   Strengths:  Able to Communicate Effectively Intelligent, good sense of humour, able to find positives in things  Supports: Spouse   Client Statement of Needs: Getting support and additional strategies to manage loss of parents, current health condition and family stressors.   Treatment Level: Outpatient individual psychotherapy 1-3 times a month  Client Treatment Preferences:TeleMedicine or In-Person   Diagnosis:  Adjustment disorder with mixed anxiety and depressed mood   Symptoms:  The development of emotional or behavioral symptoms in response to an identifiable stressor(s) occurring within 3 months of the onset of the stressor(s).  Goals: Improve coping skills by learning effective ways to adapt and manage anxiety & depressive symptoms.  Objective: Build resilience and learn to adapt to stress and hardships.   Diagnosis: Grief Goals: Begin healthy grieving related to the loss.  Objective: Verbalize feelings associated with the loss.  Progress Documentation:   Progressing  Interventions:  Cognitive Behavioral Therapy and Psycho-education/Bibliotherapy Grief Therapy   Expected duration of treatment: 9-12 months  Party responsible for implementation of interventions: This Clinician, Esaw Knippel P. Corene Resnick, LCSW.   This plan has been reviewed and created by the following participants: Olam Gosling & Martavious Hartel P. Amariz Flamenco.   A new plan will be created at least every 12 months.  The patient fully participated in the development of treatment plan with the clinician and verbally consents to such treatment.   Patient Treatment Plan Signature Obtained: No, pending signature via MyChart.   Tryniti Laatsch SHAUNNA Pouch, LCSW

## 2024-02-13 NOTE — Progress Notes (Unsigned)
 [facility] Counselor Progress Note - TELEMEDICINE VISIT  Patient ID: Melanie [name], MRN: [id number]    Date: 02/13/24  Time Spent: 2:02pm   - 2:55  : 53 Minutes  Types of Service: Individual psychotherapy and Video visit  Client and/or Legal Guardian location: [place], [place] Therapist location: [facility], [place]  All persons participating in visit: Client & this therapist  I connected with client and/or legal guardian via Video Enabled Telemedicine Application  (Video is Caregility application) and verified that I am speaking with the correct person using two identifiers. Discussed confidentiality: Yes   I discussed the limitations of telemedicine and the availability of in person appointments.  Discussed there is a possibility of technology failure and discussed alternative modes of communication if that failure occurs.  I discussed that engaging in this telemedicine visit, they consent to the provision of behavioral healthcare and the services will be billed under their insurance.  Client and/or legal guardian expressed understanding and consented to Telemedicine visit: Yes    Presenting Concerns:  - Recent concerns with her health and family stressors  Mental Status Exam: Appearance:  Casual     Behavior: Appropriate  Motor: Normal  Speech/Language:  Normal Rate  Affect: Appropriate and Tearful  Mood: anxious and depressed  Thought process: normal  Thought content:   WNL  Sensory/Perceptual disturbances:   WNL  Orientation: oriented to person, place, time/date, situation, and day of week  Attention: Good  Concentration: Good  Memory: WNL  Fund of knowledge:  Good  Insight:   Good  Judgment:  Good  Impulse Control: Good   Risk Assessment: Danger to Self:  No Self-injurious Behavior: No Danger to Others: No Duty to Warn:no   Subjective:  Carolann reported: Challenges & concerns with her health Concerns with her son's mental  health Grieving loss of parents  Interventions: Developed more specific treatment plan. Cognitive Behavioral Therapy and Grief Therapy - Reflective and supportive counseling - Identified thoughts & emotions with various situations - Identified strengths and coping strategies to implement  Client Response: Alyzabeth shared recent concerns with her health and son's mental health. Kemaria verbalized her thoughts & feelings about the various situations. She was able to identify her support system and coping strategies she can utilize.   Diagnosis:  Adjustment disorder with mixed anxiety and depressed mood   Goals, Assessment & Plan:   Starlet is experiencing various stressors and losses that is affecting her mood and increasing her anxiety level.  She was open to developing a plan to help her cope through this time.  Carlette will work on increasing pleasant activities with one of the following identified things she enjoys. - Watercolor note books - Build legos  Treatment Level 2-3 times a month  Treatment Plan - See Treatment Plan Note for details  Glenis Musolf P. Trudy, MSW, LCSW [employer] Therapist Main Office: [phone]

## 2024-02-14 ENCOUNTER — Telehealth: Payer: Self-pay | Admitting: Physician Assistant

## 2024-02-14 ENCOUNTER — Encounter: Payer: Self-pay | Admitting: Medical Oncology

## 2024-02-14 NOTE — Telephone Encounter (Signed)
 I notified Melanie [name] and spouse by phone regarding PET scan results. Image shows multiple FDG-avid lymph nodes in the portocaval, right iliac chain, and right inguinal/proximal right thigh regions. Will reach out to general surgery team to review for consideration of excisional biopsy as lymphoproliferative disorder and metastatic adenopathy are still in the ddx. All of patient's questions were answered and she expressed understanding of the plan provided.

## 2024-02-15 ENCOUNTER — Telehealth: Payer: Self-pay | Admitting: Family Medicine

## 2024-02-15 ENCOUNTER — Encounter: Payer: Self-pay | Admitting: Physician Assistant

## 2024-02-15 NOTE — Telephone Encounter (Signed)
 noted

## 2024-02-15 NOTE — Telephone Encounter (Signed)
 Pt dropped off FMLA forms. Forms in folder

## 2024-02-18 ENCOUNTER — Ambulatory Visit: Payer: Self-pay | Admitting: Surgery

## 2024-02-18 ENCOUNTER — Ambulatory Visit: Admitting: Family Medicine

## 2024-02-18 NOTE — H&P (View-Only) (Signed)
 REFERRING PHYSICIAN:  Walisiewicz, Kaitlyn E PROVIDER:  SHELBY LYNN ALLEN, MD MRN: [id number] DOB: Jul 20, 1975 DATE OF ENCOUNTER: 02/18/2024 Subjective    Chief Complaint: New Consultation   History of Present Illness: Melanie [name] is a 48 y.o. female who is seen today as an office consultation for evaluation of New Consultation  She has been having fatigue and fevers, and was recently seen in the ED on 11/18. She had a CT scan which showed enlarged periportal, aortocaval, right iliac and right inguinal lymph nodes. She was referred to the medical oncology rapid diagnostic clinic and was sent for a PET scan. This showed FDG-avid nodes in the right iliac and inguinal regions, concerning for a neoplastic process. She has been referred for excisional biopsy. She has previously had an extensive infectious workup by ID which has not shown a source for her symptoms.  She does not take any blood thinners.     Review of Systems: A complete review of systems was obtained from the patient.  I have reviewed this information and discussed as appropriate with the patient.  See HPI as well for other ROS.   Medical History: Past Medical History:  Diagnosis Date  . Anxiety   . Chronic kidney disease   . Sleep apnea     There is no problem list on file for this patient.   Past Surgical History:  Procedure Laterality Date  . LAPAROSCOPIC GASTRIC BYPASS       Allergies  Allergen Reactions  . Colesevelam  Shortness Of Breath  . Lidocaine  Other (See Comments)    Flu like sx's,topical  . Celecoxib Rash    Current Outpatient Medications on File Prior to Visit  Medication Sig Dispense Refill  . buPROPion  (WELLBUTRIN  SR) 150 MG SR tablet Take 150 mg by mouth 2 (two) times daily    . busPIRone  (BUSPAR ) 10 MG tablet Take 10 mg by mouth 2 (two) times daily    . cholecalciferol (VITAMIN D3) 1,250 mcg (50,000 unit) capsule Take 1.25 mg by mouth    . citalopram  (CELEXA ) 20 MG tablet Take 20 mg  by mouth once daily    . fluticasone  propionate (FLONASE ) 50 mcg/actuation nasal spray Place 2 sprays into one nostril    . levothyroxine  (SYNTHROID ) 88 MCG tablet TAKE ONE TABLET BY MOUTH DAILY BEFORE BREAKFAST    . lisinopriL  (ZESTRIL ) 20 MG tablet Take 20 mg by mouth once daily    . LORazepam  (ATIVAN ) 0.5 MG tablet Take 0.5 mg by mouth 2 (two) times daily as needed for Anxiety    . montelukast  (SINGULAIR ) 10 mg tablet Take 10 mg by mouth at bedtime    . ondansetron  (ZOFRAN -ODT) 4 MG disintegrating tablet Take 4 mg by mouth every 8 (eight) hours as needed    . rosuvastatin  (CRESTOR ) 10 MG tablet Take 10 mg by mouth once daily    . topiramate  (TOPAMAX ) 100 MG tablet Take 100 mg by mouth    . UBRELVY  100 mg Tab Take 100 mg by mouth     No current facility-administered medications on file prior to visit.    Family History  Problem Relation Age of Onset  . Obesity Mother   . Diabetes Mother   . Colon cancer Mother   . Skin cancer Father   . Hyperlipidemia (Elevated cholesterol) Father      Social History   Tobacco Use  Smoking Status Never  Smokeless Tobacco Never     Social History   Socioeconomic History  .  Marital status: Married  Tobacco Use  . Smoking status: Never  . Smokeless tobacco: Never  Vaping Use  . Vaping status: Never Used  Substance and Sexual Activity  . Alcohol use: Never  . Drug use: Never   Social Drivers of Corporate Investment Banker Strain: Low Risk  (07/21/2022)   Received from [facility]   Overall Financial Resource Strain (CARDIA)   . Difficulty of Paying Living Expenses: Not hard at all  Food Insecurity: No Food Insecurity (02/06/2024)   [facility]   Hunger Vital Sign   . Within the past 12 months, you worried that your food would run out before you got the money to buy more.: Never true   . Within the past 12 months, the food you bought just didn't last and you didn't have money to get more.: Never true  Transportation  Needs: No Transportation Needs (02/06/2024)   Received from [facility] - Transportation   . In the past 12 months, has lack of transportation kept you from medical appointments or from getting medications?: No   . In the past 12 months, has lack of transportation kept you from meetings, work, or from getting things needed for daily living?: No  Physical Activity: Insufficiently Active (07/21/2022)   Received from [facility]   Exercise Vital Sign   . On average, how many days per week do you engage in moderate to strenuous exercise (like a brisk walk)?: 1 day   . On average, how many minutes do you engage in exercise at this level?: 20 min  Stress: Stress Concern Present (07/21/2022)   Received from [facility] - Occupational Stress Questionnaire   . Feeling of Stress : To some extent  Social Connections: Socially Integrated (07/21/2022)   Received from [facility]   Social Connection and Isolation Panel   . In a typical week, how many times do you talk on the phone with family, friends, or neighbors?: Three times a week   . How often do you get together with friends or relatives?: Once a week   . How often do you attend church or religious services?: More than 4 times per year   . Do you belong to any clubs or organizations such as church groups, unions, fraternal or athletic groups, or school groups?: Yes   . How often do you attend meetings of the clubs or organizations you belong to?: More than 4 times per year   . Are you married, widowed, divorced, separated, never married, or living with a partner?: Married    Objective:   Vitals:   02/18/24 1448  BP: 123/81  Pulse: 71  Temp: 36.3 C (97.3 F)  SpO2: 97%  Weight: (!) 143.2 kg (315 lb 12.8 oz)  Height: 172.7 cm (5' 8)  PainSc:   4    Body mass index is 48.02 kg/m.  Physical Exam Vitals reviewed.  Constitutional:      General: She is not in acute distress.     Appearance: Normal appearance.  Eyes:     General: No scleral icterus.    Conjunctiva/sclera: Conjunctivae normal.  Cardiovascular:     Rate and Rhythm: Normal rate and regular rhythm.     Heart sounds: No murmur heard. Pulmonary:     Effort: Pulmonary effort is normal. No respiratory distress.  Lymphadenopathy:     Comments: Palpable lymph nodes in right groin  Skin:  General: Skin is warm and dry.  Neurological:     General: No focal deficit present.     Mental Status: She is alert and oriented to person, place, and time.        Labs, Imaging and Diagnostic Testing: PET/CT 02/13/24: IMPRESSION: 1. Multiple FDG-avid lymph nodes in the portocaval, right iliac chain, and right inguinal/proximal right thigh regions. Imaging findings may reflect metastatic adenopathy or lymphoproliferative disorder. Consider further evaluation with tissue sampling. . . 2. Splenomegaly.   Assessment and Plan:     Diagnoses and all orders for this visit:  Lymphadenopathy, inguinal    48 yo female with fatigue and fevers, found to have multiples sites of PET-avid lymphadenopathy on workup. An excisional lymph node biopsy has been requested by oncology to obtain a tissue diagnosis. I personally reviewed her labs, imaging and referral notes. The right inguinal lymph nodes are amenable to excisional biopsy. I reviewed the details of the planned procedure with the patient, including the benefits and risks of bleeding, infection, lymph leak and seroma. She expressed understanding and consents to proceed with surgery. She has been scheduled for surgery later this week on 12/11.     SHELBY LYNN ALLEN, MD

## 2024-02-18 NOTE — Progress Notes (Signed)
 REFERRING PHYSICIAN:  Walisiewicz, Kaitlyn E PROVIDER:  SHELBY LYNN ALLEN, MD MRN: [id number] DOB: Jul 20, 1975 DATE OF ENCOUNTER: 02/18/2024 Subjective    Chief Complaint: New Consultation   History of Present Illness: Melanie [name] is a 48 y.o. female who is seen today as an office consultation for evaluation of New Consultation  She has been having fatigue and fevers, and was recently seen in the ED on 11/18. She had a CT scan which showed enlarged periportal, aortocaval, right iliac and right inguinal lymph nodes. She was referred to the medical oncology rapid diagnostic clinic and was sent for a PET scan. This showed FDG-avid nodes in the right iliac and inguinal regions, concerning for a neoplastic process. She has been referred for excisional biopsy. She has previously had an extensive infectious workup by ID which has not shown a source for her symptoms.  She does not take any blood thinners.     Review of Systems: A complete review of systems was obtained from the patient.  I have reviewed this information and discussed as appropriate with the patient.  See HPI as well for other ROS.   Medical History: Past Medical History:  Diagnosis Date  . Anxiety   . Chronic kidney disease   . Sleep apnea     There is no problem list on file for this patient.   Past Surgical History:  Procedure Laterality Date  . LAPAROSCOPIC GASTRIC BYPASS       Allergies  Allergen Reactions  . Colesevelam  Shortness Of Breath  . Lidocaine  Other (See Comments)    Flu like sx's,topical  . Celecoxib Rash    Current Outpatient Medications on File Prior to Visit  Medication Sig Dispense Refill  . buPROPion  (WELLBUTRIN  SR) 150 MG SR tablet Take 150 mg by mouth 2 (two) times daily    . busPIRone  (BUSPAR ) 10 MG tablet Take 10 mg by mouth 2 (two) times daily    . cholecalciferol (VITAMIN D3) 1,250 mcg (50,000 unit) capsule Take 1.25 mg by mouth    . citalopram  (CELEXA ) 20 MG tablet Take 20 mg  by mouth once daily    . fluticasone  propionate (FLONASE ) 50 mcg/actuation nasal spray Place 2 sprays into one nostril    . levothyroxine  (SYNTHROID ) 88 MCG tablet TAKE ONE TABLET BY MOUTH DAILY BEFORE BREAKFAST    . lisinopriL  (ZESTRIL ) 20 MG tablet Take 20 mg by mouth once daily    . LORazepam  (ATIVAN ) 0.5 MG tablet Take 0.5 mg by mouth 2 (two) times daily as needed for Anxiety    . montelukast  (SINGULAIR ) 10 mg tablet Take 10 mg by mouth at bedtime    . ondansetron  (ZOFRAN -ODT) 4 MG disintegrating tablet Take 4 mg by mouth every 8 (eight) hours as needed    . rosuvastatin  (CRESTOR ) 10 MG tablet Take 10 mg by mouth once daily    . topiramate  (TOPAMAX ) 100 MG tablet Take 100 mg by mouth    . UBRELVY  100 mg Tab Take 100 mg by mouth     No current facility-administered medications on file prior to visit.    Family History  Problem Relation Age of Onset  . Obesity Mother   . Diabetes Mother   . Colon cancer Mother   . Skin cancer Father   . Hyperlipidemia (Elevated cholesterol) Father      Social History   Tobacco Use  Smoking Status Never  Smokeless Tobacco Never     Social History   Socioeconomic History  .  Marital status: Married  Tobacco Use  . Smoking status: Never  . Smokeless tobacco: Never  Vaping Use  . Vaping status: Never Used  Substance and Sexual Activity  . Alcohol use: Never  . Drug use: Never   Social Drivers of Corporate Investment Banker Strain: Low Risk  (07/21/2022)   Received from [facility]   Overall Financial Resource Strain (CARDIA)   . Difficulty of Paying Living Expenses: Not hard at all  Food Insecurity: No Food Insecurity (02/06/2024)   [facility]   Hunger Vital Sign   . Within the past 12 months, you worried that your food would run out before you got the money to buy more.: Never true   . Within the past 12 months, the food you bought just didn't last and you didn't have money to get more.: Never true  Transportation  Needs: No Transportation Needs (02/06/2024)   Received from [facility] - Transportation   . In the past 12 months, has lack of transportation kept you from medical appointments or from getting medications?: No   . In the past 12 months, has lack of transportation kept you from meetings, work, or from getting things needed for daily living?: No  Physical Activity: Insufficiently Active (07/21/2022)   Received from [facility]   Exercise Vital Sign   . On average, how many days per week do you engage in moderate to strenuous exercise (like a brisk walk)?: 1 day   . On average, how many minutes do you engage in exercise at this level?: 20 min  Stress: Stress Concern Present (07/21/2022)   Received from [facility] - Occupational Stress Questionnaire   . Feeling of Stress : To some extent  Social Connections: Socially Integrated (07/21/2022)   Received from [facility]   Social Connection and Isolation Panel   . In a typical week, how many times do you talk on the phone with family, friends, or neighbors?: Three times a week   . How often do you get together with friends or relatives?: Once a week   . How often do you attend church or religious services?: More than 4 times per year   . Do you belong to any clubs or organizations such as church groups, unions, fraternal or athletic groups, or school groups?: Yes   . How often do you attend meetings of the clubs or organizations you belong to?: More than 4 times per year   . Are you married, widowed, divorced, separated, never married, or living with a partner?: Married    Objective:   Vitals:   02/18/24 1448  BP: 123/81  Pulse: 71  Temp: 36.3 C (97.3 F)  SpO2: 97%  Weight: (!) 143.2 kg (315 lb 12.8 oz)  Height: 172.7 cm (5' 8)  PainSc:   4    Body mass index is 48.02 kg/m.  Physical Exam Vitals reviewed.  Constitutional:      General: She is not in acute distress.     Appearance: Normal appearance.  Eyes:     General: No scleral icterus.    Conjunctiva/sclera: Conjunctivae normal.  Cardiovascular:     Rate and Rhythm: Normal rate and regular rhythm.     Heart sounds: No murmur heard. Pulmonary:     Effort: Pulmonary effort is normal. No respiratory distress.  Lymphadenopathy:     Comments: Palpable lymph nodes in right groin  Skin:  General: Skin is warm and dry.  Neurological:     General: No focal deficit present.     Mental Status: She is alert and oriented to person, place, and time.        Labs, Imaging and Diagnostic Testing: PET/CT 02/13/24: IMPRESSION: 1. Multiple FDG-avid lymph nodes in the portocaval, right iliac chain, and right inguinal/proximal right thigh regions. Imaging findings may reflect metastatic adenopathy or lymphoproliferative disorder. Consider further evaluation with tissue sampling. . . 2. Splenomegaly.   Assessment and Plan:     Diagnoses and all orders for this visit:  Lymphadenopathy, inguinal    48 yo female with fatigue and fevers, found to have multiples sites of PET-avid lymphadenopathy on workup. An excisional lymph node biopsy has been requested by oncology to obtain a tissue diagnosis. I personally reviewed her labs, imaging and referral notes. The right inguinal lymph nodes are amenable to excisional biopsy. I reviewed the details of the planned procedure with the patient, including the benefits and risks of bleeding, infection, lymph leak and seroma. She expressed understanding and consents to proceed with surgery. She has been scheduled for surgery later this week on 12/11.     SHELBY LYNN ALLEN, MD

## 2024-02-19 ENCOUNTER — Encounter (HOSPITAL_COMMUNITY): Payer: Self-pay | Admitting: Surgery

## 2024-02-19 ENCOUNTER — Other Ambulatory Visit: Payer: Self-pay

## 2024-02-19 ENCOUNTER — Other Ambulatory Visit (HOSPITAL_COMMUNITY)

## 2024-02-19 NOTE — Progress Notes (Signed)
 PCP - Philippe Slade, NP Cardiologist - Dr Vinie Maxcy ( last Tele Med 02/2019) [place] - Dr Gustav Mcgee Neurology - Dr True Mar  Chest x-ray - 01/24/24 EKG - 01/30/24 Stress Test - none ECHO - 05/23/17 Cardiac Cath - n/a  ICD Pacemaker/Loop - n/a  Sleep Study -  Yes CPAP - occasional uses CPAP  Diabetes - n/a  Aspirin - n/a   Farxiga - Hold for 72 hours prior to procedure.  Last dose was on 02/19/24 per patient.  ERAS - clear liquids til 0430 DOS.  Anesthesia review: no  STOP now taking any Aspirin (unless otherwise instructed by your surgeon), Aleve, Naproxen, Ibuprofen , Motrin , Advil , Goody's, BC's, all herbal medications, fish oil, and all vitamins.   Coronavirus Screening Do you have any of the following symptoms:  Cough yes/no: No Fever (>100.27F)  yes/no: No Runny nose yes/no: No Sore throat yes/no: No Difficulty breathing/shortness of breath  yes/no: No  Have you traveled in the last 14 days and where? yes/no: No  Patient verbalized understanding of instructions that were given via phone.

## 2024-02-20 ENCOUNTER — Ambulatory Visit: Admitting: Clinical

## 2024-02-20 DIAGNOSIS — F4323 Adjustment disorder with mixed anxiety and depressed mood: Secondary | ICD-10-CM

## 2024-02-20 NOTE — Progress Notes (Signed)
 [facility] Counselor Progress Note - TELEMEDICINE VISIT  Patient ID: Melanie [name], MRN: [id number]    Date: 02/20/24  Time Spent: 1:59pm   - 2:55pm  : 56 Minutes  Types of Service: Individual psychotherapy and Video visit  Client and/or Legal Guardian location: Work-[place], [place] Therapist location: [facility] [employer] - [place], [place] All persons participating in visit: Client & this therapist  I connected with client and/or legal guardian via [employer] (consulting)  (Video is Caregility application) and verified that I am speaking with the correct person using two identifiers. Discussed confidentiality: Yes   I discussed the limitations of telemedicine and the availability of in person appointments.  Discussed there is a possibility of technology failure and discussed alternative modes of communication if that failure occurs.  I discussed that engaging in this telemedicine visit, they consent to the provision of behavioral healthcare and the services will be billed under their insurance.  Client and/or legal guardian expressed understanding and consented to Telemedicine visit: Yes    Presenting Concerns:  -Increased anxiety due to current health concerns  Mental Status Exam: Appearance:  Neat     Behavior: Appropriate  Motor: Normal  Speech/Language:  Normal Rate  Affect: Appropriate  Mood: anxious and sad  Thought process: normal  Thought content:   WNL  Sensory/Perceptual disturbances:   WNL  Orientation: oriented to person, place, time/date, situation, and day of week  Attention: Good  Concentration: Good  Memory: WNL  Fund of knowledge:  Good  Insight:   Good  Judgment:  Good  Impulse Control: Good   Risk Assessment: Danger to Self:  No Self-injurious Behavior: No Danger to Others: No Duty to Warn:no   Subjective:  Melanie [name] is experiencing changes in her health condition and will have surgery tomorrow to obtain a biopsy on  her lymph nodes.  Interventions: Narrative and Grief Therapy  Client Response: Melanie [name] shared her thoughts & feelings about current situation with her health.  She also started to share her experiences when she took care of her mother & father through their illnesses.  Melanie [name] has a strong support system with her husband.  She shared about how they met and their relationship. She is anxious about how her children will cope with her illness.   Diagnosis:  Adjustment disorder with mixed anxiety and depressed mood   Goals, Assessment & Plan:   Melanie [name] has experienced multiple losses this past year.  She's recently undergoing changes in her health condition which is increasing her anxiety level.  Melanie [name] is able to articulate her feelings and utilizing her support system through this time.  Treatment Level 1-3 times a month  Modality - TeleMedicine - Video  Goals: Improve coping skills by learning effective ways to adapt and manage anxiety & depressive symptoms.   Objective: Build resilience and learn to adapt to stress and hardships.  - Melanie [name] is looking forward to experiencing the holidays since she enjoys doing that and leaning on her husband for support.   Diagnosis: Grief Goals: Begin healthy grieving related to the loss.   Objective: Verbalize feelings associated with the loss.  Melanie [name] began to share her feelings about the loss of her parents.   Melanie [name], MSW, LCSW [employer] Therapist Main Office: [phone]

## 2024-02-20 NOTE — Anesthesia Preprocedure Evaluation (Addendum)
 Anesthesia Evaluation  Patient identified by MRN, date of birth, ID band Patient awake    Reviewed: Allergy & Precautions, NPO status , Patient's Chart, lab work & pertinent test results  Airway Mallampati: III  TM Distance: >3 FB Neck ROM: Full    Dental  (+) Teeth Intact, Dental Advisory Given   Pulmonary sleep apnea and Continuous Positive Airway Pressure Ventilation    Pulmonary exam normal breath sounds clear to auscultation       Cardiovascular hypertension, Pt. on medications Normal cardiovascular exam Rhythm:Regular Rate:Normal     Neuro/Psych  Headaches, Seizures -, Well Controlled,  PSYCHIATRIC DISORDERS Anxiety Depression     Neuromuscular disease    GI/Hepatic ,GERD  ,,NASH S/p gastric sleeve   Endo/Other  Hypothyroidism  Class 3 obesity  Renal/GU Renal disease     Musculoskeletal  (+) Arthritis ,    Abdominal   Peds  Hematology negative hematology ROS (+)   Anesthesia Other Findings Day of surgery medications reviewed with the patient.  Reproductive/Obstetrics                              Anesthesia Physical Anesthesia Plan  ASA: 3  Anesthesia Plan: General   Post-op Pain Management: Tylenol  PO (pre-op)* and Toradol  IV (intra-op)*   Induction: Intravenous  PONV Risk Score and Plan: 3 and Midazolam , Dexamethasone  and Ondansetron   Airway Management Planned: LMA  Additional Equipment:   Intra-op Plan:   Post-operative Plan: Extubation in OR  Informed Consent: I have reviewed the patients History and Physical, chart, labs and discussed the procedure including the risks, benefits and alternatives for the proposed anesthesia with the patient or authorized representative who has indicated his/her understanding and acceptance.     Dental advisory given  Plan Discussed with: CRNA  Anesthesia Plan Comments:          Anesthesia Quick Evaluation

## 2024-02-21 ENCOUNTER — Ambulatory Visit (HOSPITAL_COMMUNITY): Admitting: Anesthesiology

## 2024-02-21 ENCOUNTER — Encounter: Admission: RE | Disposition: A | Payer: Self-pay | Source: Home / Self Care | Attending: Surgery

## 2024-02-21 ENCOUNTER — Ambulatory Visit: Admitting: Family Medicine

## 2024-02-21 ENCOUNTER — Other Ambulatory Visit: Payer: Self-pay

## 2024-02-21 ENCOUNTER — Encounter (HOSPITAL_COMMUNITY): Payer: Self-pay | Admitting: Surgery

## 2024-02-21 ENCOUNTER — Ambulatory Visit (HOSPITAL_COMMUNITY): Admission: RE | Admit: 2024-02-21 | Discharge: 2024-02-21 | Disposition: A | Attending: Surgery | Admitting: Surgery

## 2024-02-21 DIAGNOSIS — Z9884 Bariatric surgery status: Secondary | ICD-10-CM | POA: Diagnosis not present

## 2024-02-21 DIAGNOSIS — F418 Other specified anxiety disorders: Secondary | ICD-10-CM | POA: Diagnosis not present

## 2024-02-21 DIAGNOSIS — R519 Headache, unspecified: Secondary | ICD-10-CM | POA: Diagnosis not present

## 2024-02-21 DIAGNOSIS — F32A Depression, unspecified: Secondary | ICD-10-CM | POA: Diagnosis not present

## 2024-02-21 DIAGNOSIS — I10 Essential (primary) hypertension: Secondary | ICD-10-CM | POA: Diagnosis not present

## 2024-02-21 DIAGNOSIS — E66813 Obesity, class 3: Secondary | ICD-10-CM | POA: Diagnosis not present

## 2024-02-21 DIAGNOSIS — G4730 Sleep apnea, unspecified: Secondary | ICD-10-CM | POA: Diagnosis not present

## 2024-02-21 DIAGNOSIS — I129 Hypertensive chronic kidney disease with stage 1 through stage 4 chronic kidney disease, or unspecified chronic kidney disease: Secondary | ICD-10-CM | POA: Diagnosis not present

## 2024-02-21 DIAGNOSIS — Z79890 Hormone replacement therapy (postmenopausal): Secondary | ICD-10-CM | POA: Diagnosis not present

## 2024-02-21 DIAGNOSIS — R569 Unspecified convulsions: Secondary | ICD-10-CM | POA: Diagnosis not present

## 2024-02-21 DIAGNOSIS — E039 Hypothyroidism, unspecified: Secondary | ICD-10-CM | POA: Diagnosis not present

## 2024-02-21 DIAGNOSIS — R590 Localized enlarged lymph nodes: Secondary | ICD-10-CM | POA: Diagnosis present

## 2024-02-21 DIAGNOSIS — K7581 Nonalcoholic steatohepatitis (NASH): Secondary | ICD-10-CM | POA: Diagnosis not present

## 2024-02-21 DIAGNOSIS — M1990 Unspecified osteoarthritis, unspecified site: Secondary | ICD-10-CM | POA: Diagnosis not present

## 2024-02-21 DIAGNOSIS — Z79899 Other long term (current) drug therapy: Secondary | ICD-10-CM | POA: Diagnosis not present

## 2024-02-21 DIAGNOSIS — K219 Gastro-esophageal reflux disease without esophagitis: Secondary | ICD-10-CM | POA: Diagnosis not present

## 2024-02-21 DIAGNOSIS — R5383 Other fatigue: Secondary | ICD-10-CM | POA: Diagnosis not present

## 2024-02-21 DIAGNOSIS — N189 Chronic kidney disease, unspecified: Secondary | ICD-10-CM | POA: Diagnosis not present

## 2024-02-21 DIAGNOSIS — R509 Fever, unspecified: Secondary | ICD-10-CM | POA: Diagnosis not present

## 2024-02-21 DIAGNOSIS — Z6841 Body Mass Index (BMI) 40.0 and over, adult: Secondary | ICD-10-CM | POA: Diagnosis not present

## 2024-02-21 DIAGNOSIS — F419 Anxiety disorder, unspecified: Secondary | ICD-10-CM | POA: Diagnosis not present

## 2024-02-21 HISTORY — PX: INGUINAL LYMPH NODE BIOPSY: SHX5865

## 2024-02-21 LAB — POCT PREGNANCY, URINE: Preg Test, Ur: NEGATIVE

## 2024-02-21 SURGERY — BIOPSY, LYMPH NODE, INGUINAL, OPEN
Anesthesia: General | Site: Inguinal | Laterality: Right

## 2024-02-21 MED ORDER — LIDOCAINE 2% (20 MG/ML) 5 ML SYRINGE
INTRAMUSCULAR | Status: DC | PRN
  Administered 2024-02-21: 100 mg via INTRAVENOUS

## 2024-02-21 MED ORDER — FENTANYL CITRATE (PF) 250 MCG/5ML IJ SOLN
INTRAMUSCULAR | Status: DC | PRN
  Administered 2024-02-21 (×2): 50 ug via INTRAVENOUS

## 2024-02-21 MED ORDER — AMISULPRIDE (ANTIEMETIC) 5 MG/2ML IV SOLN: 10.0000 mg | Freq: Once | INTRAVENOUS | Status: DC | PRN

## 2024-02-21 MED ORDER — BUPIVACAINE HCL 0.25 % IJ SOLN
INTRAMUSCULAR | Status: DC | PRN
  Administered 2024-02-21: 20 mL

## 2024-02-21 MED ORDER — FENTANYL CITRATE (PF) 100 MCG/2ML IJ SOLN
INTRAMUSCULAR | Status: AC
  Filled 2024-02-21: qty 2

## 2024-02-21 MED ORDER — FENTANYL CITRATE (PF) 100 MCG/2ML IJ SOLN
25.0000 ug | INTRAMUSCULAR | Status: DC | PRN
  Administered 2024-02-21 (×2): 50 ug via INTRAVENOUS

## 2024-02-21 MED ORDER — TRAMADOL HCL 50 MG PO TABS: 50.0000 mg | ORAL_TABLET | Freq: Four times a day (QID) | ORAL | 0 refills | Status: AC | PRN

## 2024-02-21 MED ORDER — ONDANSETRON HCL 4 MG/2ML IJ SOLN: 4.0000 mg | Freq: Once | INTRAMUSCULAR | Status: DC | PRN

## 2024-02-21 MED ORDER — BUPIVACAINE HCL (PF) 0.25 % IJ SOLN
INTRAMUSCULAR | Status: AC
  Filled 2024-02-21: qty 30

## 2024-02-21 MED ORDER — GLYCOPYRROLATE PF 0.2 MG/ML IJ SOSY
PREFILLED_SYRINGE | INTRAMUSCULAR | Status: DC | PRN
  Administered 2024-02-21: .1 mg via INTRAVENOUS

## 2024-02-21 MED ORDER — MIDAZOLAM HCL 2 MG/2ML IJ SOLN
INTRAMUSCULAR | Status: AC
  Filled 2024-02-21: qty 2

## 2024-02-21 MED ORDER — DEXAMETHASONE SOD PHOSPHATE PF 10 MG/ML IJ SOLN
INTRAMUSCULAR | Status: DC | PRN
  Administered 2024-02-21: 10 mg via INTRAVENOUS

## 2024-02-21 MED ORDER — CEFAZOLIN SODIUM-DEXTROSE 3-4 GM/150ML-% IV SOLN
3.0000 g | INTRAVENOUS | Status: AC
  Administered 2024-02-21: 3 g via INTRAVENOUS
  Filled 2024-02-21: qty 150

## 2024-02-21 MED ORDER — PROPOFOL 500 MG/50ML IV EMUL
INTRAVENOUS | Status: DC | PRN
  Administered 2024-02-21: 150 ug/kg/min via INTRAVENOUS

## 2024-02-21 MED ORDER — ONDANSETRON HCL 4 MG/2ML IJ SOLN
INTRAMUSCULAR | Status: DC | PRN
  Administered 2024-02-21: 4 mg via INTRAVENOUS

## 2024-02-21 MED ORDER — PROPOFOL 10 MG/ML IV BOLUS
INTRAVENOUS | Status: DC | PRN
  Administered 2024-02-21: 180 mg via INTRAVENOUS

## 2024-02-21 MED ORDER — ORAL CARE MOUTH RINSE: 15.0000 mL | Freq: Once | OROMUCOSAL | Status: AC

## 2024-02-21 MED ORDER — ACETAMINOPHEN 500 MG PO TABS
1000.0000 mg | ORAL_TABLET | Freq: Once | ORAL | Status: AC
  Administered 2024-02-21: 1000 mg via ORAL
  Filled 2024-02-21: qty 2

## 2024-02-21 MED ORDER — CHLORHEXIDINE GLUCONATE 0.12 % MT SOLN
15.0000 mL | Freq: Once | OROMUCOSAL | Status: AC
  Administered 2024-02-21: 15 mL via OROMUCOSAL
  Filled 2024-02-21: qty 15

## 2024-02-21 MED ORDER — MIDAZOLAM HCL (PF) 2 MG/2ML IJ SOLN
INTRAMUSCULAR | Status: DC | PRN
  Administered 2024-02-21: 2 mg via INTRAVENOUS

## 2024-02-21 MED ORDER — LACTATED RINGERS IV SOLN: INTRAVENOUS | Status: DC

## 2024-02-21 SURGICAL SUPPLY — 26 items
BAG COUNTER SPONGE SURGICOUNT (BAG) ×1 IMPLANT
BLADE CLIPPER SURG (BLADE) IMPLANT
CANISTER SUCTION 3000ML PPV (SUCTIONS) IMPLANT
CHLORAPREP W/TINT 26 (MISCELLANEOUS) ×1 IMPLANT
CLIP APPLIE 9.375 MED OPEN (MISCELLANEOUS) ×1 IMPLANT
COVER SURGICAL LIGHT HANDLE (MISCELLANEOUS) ×1 IMPLANT
DERMABOND ADVANCED .7 DNX12 (GAUZE/BANDAGES/DRESSINGS) ×1 IMPLANT
DRAPE LAPAROTOMY 100X72 PEDS (DRAPES) ×1 IMPLANT
ELECTRODE REM PT RTRN 9FT ADLT (ELECTROSURGICAL) ×1 IMPLANT
GAUZE 4X4 16PLY ~~LOC~~+RFID DBL (SPONGE) ×1 IMPLANT
GLOVE BIOGEL PI IND STRL 6 (GLOVE) ×1 IMPLANT
GLOVE SS PI 5.5 STRL (GLOVE) ×1 IMPLANT
GOWN STRL REUS W/ TWL LRG LVL3 (GOWN DISPOSABLE) ×2 IMPLANT
KIT BASIN OR (CUSTOM PROCEDURE TRAY) ×1 IMPLANT
KIT TURNOVER KIT B (KITS) ×1 IMPLANT
NDL HYPO 25GX1X1/2 BEV (NEEDLE) ×1 IMPLANT
PACK GENERAL/GYN (CUSTOM PROCEDURE TRAY) ×1 IMPLANT
PAD ARMBOARD POSITIONER FOAM (MISCELLANEOUS) ×1 IMPLANT
SOLN 0.9% NACL POUR BTL 1000ML (IV SOLUTION) ×1 IMPLANT
SPIKE FLUID TRANSFER (MISCELLANEOUS) ×1 IMPLANT
SPONGE INTESTINAL PEANUT (DISPOSABLE) IMPLANT
SUT MNCRL AB 4-0 PS2 18 (SUTURE) ×1 IMPLANT
SUT VIC AB 3-0 SH 8-18 (SUTURE) ×1 IMPLANT
SYR CONTROL 10ML LL (SYRINGE) ×1 IMPLANT
TOWEL GREEN STERILE (TOWEL DISPOSABLE) ×1 IMPLANT
TOWEL GREEN STERILE FF (TOWEL DISPOSABLE) ×1 IMPLANT

## 2024-02-21 NOTE — Transfer of Care (Signed)
 Immediate Anesthesia Transfer of Care Note  Patient: NAHJAE HOEG  Procedure(s) Performed: BIOPSY, LYMPH NODE, INGUINAL, OPEN (Right: Inguinal)  Patient Location: PACU  Anesthesia Type:General  Level of Consciousness: awake and drowsy  Airway & Oxygen Therapy: Patient Spontanous Breathing and Patient connected to face mask oxygen  Post-op Assessment: Report given to RN, Post -op Vital signs reviewed and stable, and Patient moving all extremities X 4  Post vital signs: Reviewed and stable  Last Vitals:  Vitals Value Taken Time  BP 123/61 02/21/24 08:17  Temp 36.7 C 02/21/24 08:17  Pulse 59 02/21/24 08:23  Resp 14 02/21/24 08:23  SpO2 94 % 02/21/24 08:23  Vitals shown include unfiled device data.  Last Pain:  Vitals:   02/21/24 0817  TempSrc:   PainSc: Asleep      Patients Stated Pain Goal: 0 (02/21/24 0622)  Complications: No notable events documented.

## 2024-02-21 NOTE — Op Note (Signed)
 Date: 02/21/2024  Patient: Melanie [name] MRN: [id number]  Preoperative Diagnosis: Lymphadenopathy Postoperative Diagnosis: Same  Procedure: Right inguinal excisional lymph node biosy  Surgeon: Leonor Dawn, MD  EBL: Minimal  Anesthesia: General LMA  Specimens: Right inguinal lymph node  Indications: [name] is a 48 yo female who presented with fatigue and fevers. Imaging workup showed enlarged aortocaval, periportal, right iliac and right inguinal lymph nodes. A PET scan showed FDG avidity of these nodes. She was evaluated by medical oncology and referred for an excisional biopsy. After a discussion of the risks and benefits of surgery, she consented to proceed with a right inguinal excisional biopsy.  Findings: Superficial right inguinal lymph node completely excised.  Procedure details: Informed consent was obtained in the preoperative area prior to the procedure. The patient was brought to the operating room and placed on the table in the supine position. General anesthesia was induced and appropriate lines and drains were placed for intraoperative monitoring. Perioperative antibiotics were administered per SCIP guidelines. The right groin was prepped and draped in the usual sterile fashion. A pre-procedure timeout was taken verifying patient identity, surgical site and procedure to be performed.  A small vertical skin incision was made just below the right inguinal crease. The subcutaneous tissue was divided with cautery and Scarpa's layer was opened. The wound was digitally palpated, and a node was palpable and visualized deep in the subcutaneous tissue, external to the muscle fascia. The node was grasped and elevated into the wound. The node appeared mildly enlarged. The node was bluntly dissected out of the surrounding tissue, and the lymphatic vessels were clipped prior to division. The node was completely excised and sent fresh to pathology. The wound was irrigated and appeared  hemostatic. The deep subcutaneous layer was closed with 3-0 Vicryl suture. The deep dermis was closed with interrupted 3-0 Vicryl sutures, and the subcuticular layer was closed with a running 4-0 monocryl suture. Dermabond was applied.  The patient tolerated the procedure well with no apparent complications. All counts were correct x2 at the end of the procedure. The patient was extubated and taken to PACU in stable condition.  Leonor Dawn, MD 02/21/2024 8:06 AM

## 2024-02-21 NOTE — Anesthesia Postprocedure Evaluation (Signed)
 Anesthesia Post Note  Patient: Melanie [name]  Procedure(s) Performed: BIOPSY, LYMPH NODE, INGUINAL, OPEN (Right: Inguinal)     Patient location during evaluation: PACU Anesthesia Type: General Level of consciousness: awake and alert Pain management: pain level controlled Vital Signs Assessment: post-procedure vital signs reviewed and stable Respiratory status: spontaneous breathing, nonlabored ventilation and respiratory function stable Cardiovascular status: blood pressure returned to baseline and stable Postop Assessment: no apparent nausea or vomiting Anesthetic complications: no   No notable events documented.  Last Vitals:  Vitals:   02/21/24 0900 02/21/24 0915  BP: 116/65 116/69  Pulse: 62 (!) 58  Resp: 12 11  Temp:  36.7 C  SpO2: 97% 95%    Last Pain:  Vitals:   02/21/24 0817  TempSrc:   PainSc: Asleep                 Garnette FORBES [place]

## 2024-02-21 NOTE — Interval H&P Note (Signed)
 History and Physical Interval Note:  02/21/2024 7:15 AM  Melanie [name]  has presented today for surgery, with the diagnosis of LYMPHADENOPATHY.  The various methods of treatment have been discussed with the patient and family. After consideration of risks, benefits and other options for treatment, the patient has consented to  Procedures with comments: BIOPSY, LYMPH NODE, INGUINAL, OPEN (Right) - RIGHT INGUINAL EXCISIONAL LYMPH NODE BIOPSY as a surgical intervention.  The patient's history has been reviewed, patient examined, no change in status, stable for surgery.  I have reviewed the patient's chart and labs.  Questions were answered to the patient's satisfaction.     Leonor LITTIE Dawn

## 2024-02-21 NOTE — Anesthesia Procedure Notes (Signed)
 Procedure Name: LMA Insertion Date/Time: 02/21/2024 7:35 AM  Performed by: Mollie Olivia SAUNDERS, CRNAPre-anesthesia Checklist: Patient identified, Emergency Drugs available, Suction available and Patient being monitored Patient Re-evaluated:Patient Re-evaluated prior to induction Oxygen Delivery Method: Circle System Utilized Preoxygenation: Pre-oxygenation with 100% oxygen Induction Type: IV induction LMA: LMA inserted LMA Size: 4.0 Number of attempts: 1 Placement Confirmation: positive ETCO2 Tube secured with: Tape Dental Injury: Teeth and Oropharynx as per pre-operative assessment  Comments: Gel lma 4; easily placed

## 2024-02-21 NOTE — Discharge Instructions (Addendum)
 [facility] DISCHARGE INSTRUCTIONS  Activity Ok to shower in 24 hours, but do not bathe or submerge incision underwater for 2 weeks Do not drive while taking narcotic pain medication. You may drive when you are no longer taking prescription pain medication, you can comfortably wear a seatbelt, and you can safely maneuver your car and apply brakes.  Wound Care Your incision is covered with skin glue called Dermabond. This will peel off on its own over time. You may shower and allow warm soapy water to run over your incision in 24 hours after surgery. Gently pat dry. Do not submerge your incision underwater for at least 2 weeks. Monitor your incision for any new redness, tenderness, or drainage. Many patients will experience some swelling and bruising at the incisions.  Ice packs will help.  Swelling and bruising can take several days to resolve.   Medications A  prescription for pain medication may be given to you upon discharge.  Take your pain medication as prescribed, if needed.  If narcotic pain medicine is not needed, then you may take acetaminophen  (Tylenol ) or ibuprofen  (Advil ) as needed. It is common to experience some constipation if taking pain medication after surgery.  Increasing fluid intake and taking a stool softener (such as Colace) will usually help or prevent this problem from occurring.  A mild laxative (Milk of Magnesia or Miralax ) should be taken according to package directions if there are no bowel movements after 48 hours. Take your usually prescribed medications unless otherwise directed. If you need a refill on your pain medication, please contact your pharmacy.  They will contact our office to request authorization. Prescriptions will not be filled after 5 pm or on weekends.  When to Call Us : Fever greater than 100.5 New redness, drainage, or swelling at incision site Severe pain, nausea, or vomiting Persistent bleeding from incision  Follow-up You  have an appointment scheduled with Dr. Dasie on March 18, 2024 at 10:20am. This will be at the [facility] office at [address], [address], [place], [place]. Please arrive at least 15 minutes prior to your scheduled appointment time.   The clinic staff is available to answer your questions during regular business hours.  Please dont hesitate to call and ask to speak to one of the nurses for clinical concerns.  If you have a medical emergency, go to the nearest emergency room or call 911.  A surgeon from [facility] is always on call at the hospital  [address], [address], [place], [place]  [address] ?  [address] [address], [place], [place]   [address] [phone] ? Toll Free: [phone] ? FAX [phone] Web site: [email or web address]      Managing Your Pain After Surgery Without Opioids    Thank you for participating in our program to help patients manage their pain after surgery without opioids. This is part of our effort to provide you with the best care possible, without exposing you or your family to the risk that opioids pose.  What pain can I expect after surgery? You can expect to have some pain after surgery. This is normal. The pain is typically worse the day after surgery, and quickly begins to get better. Many studies have found that many patients are able to manage their pain after surgery with Over-the-Counter (OTC) medications such as Tylenol  and Motrin . If you have a condition that does not allow you to take Tylenol  or Motrin , notify your surgical team.  How will I manage my pain? The best strategy for controlling your pain after surgery is around the clock pain control with Tylenol  (acetaminophen ) and Motrin  (ibuprofen  or Advil ). Alternating these medications with each other allows you to maximize your pain control. In addition to Tylenol  and Motrin , you can use heating pads or ice packs on your incisions to help reduce your  pain.  How will I alternate your regular strength over-the-counter pain medication? You will take a dose of pain medication every three hours. Start by taking 650 mg of Tylenol  (2 pills of 325 mg) 3 hours later take 600 mg of Motrin  (3 pills of 200 mg) 3 hours after taking the Motrin  take 650 mg of Tylenol  3 hours after that take 600 mg of Motrin .   - 1 -  See example - if your first dose of Tylenol  is at 12:00 PM   12:00 PM Tylenol  650 mg (2 pills of 325 mg)  3:00 PM Motrin  600 mg (3 pills of 200 mg)  6:00 PM Tylenol  650 mg (2 pills of 325 mg)  9:00 PM Motrin  600 mg (3 pills of 200 mg)  Continue alternating every 3 hours   We recommend that you follow this schedule around-the-clock for at least 3 days after surgery, or until you feel that it is no longer needed. Use the table on the last page of this handout to keep track of the medications you are taking. Important: Do not take more than 3000mg  of Tylenol  or 3200mg  of Motrin  in a 24-hour period. Do not take ibuprofen /Motrin  if you have a history of bleeding stomach ulcers, severe kidney disease, &/or actively taking a blood thinner  What if I still have pain? If you have pain that is not controlled with the over-the-counter pain medications (Tylenol  and Motrin  or Advil ) you might have what we call breakthrough pain. You will receive a prescription for a small amount of an opioid pain medication such as Oxycodone , Tramadol, or Tylenol  with Codeine . Use these opioid pills in the first 24 hours after surgery if you have breakthrough pain. Do not take more than 1 pill every 4-6 hours.  If you still have uncontrolled pain after using all opioid pills, don't hesitate to call our staff using the number provided. We will help make sure you are managing your pain in the best way possible, and if necessary, we can provide a prescription for additional pain medication.   Day 1    Time  Name of Medication Number of pills taken  Amount of  Acetaminophen   Pain Level   Comments  AM PM       AM PM       AM PM       AM PM       AM PM       AM PM       AM PM       AM PM       Total Daily amount of Acetaminophen  Do not take more than  3,000 mg per day      Day 2    Time  Name of Medication Number of pills taken  Amount of Acetaminophen   Pain Level   Comments  AM PM       AM PM       AM PM       AM PM       AM PM       AM PM  AM PM       AM PM       Total Daily amount of Acetaminophen  Do not take more than  3,000 mg per day      Day 3    Time  Name of Medication Number of pills taken  Amount of Acetaminophen   Pain Level   Comments  AM PM       AM PM       AM PM       AM PM         AM PM       AM PM       AM PM       AM PM       Total Daily amount of Acetaminophen  Do not take more than  3,000 mg per day      Day 4    Time  Name of Medication Number of pills taken  Amount of Acetaminophen   Pain Level   Comments  AM PM       AM PM       AM PM       AM PM       AM PM       AM PM       AM PM       AM PM       Total Daily amount of Acetaminophen  Do not take more than  3,000 mg per day      Day 5    Time  Name of Medication Number of pills taken  Amount of Acetaminophen   Pain Level   Comments  AM PM       AM PM       AM PM       AM PM       AM PM       AM PM       AM PM       AM PM       Total Daily amount of Acetaminophen  Do not take more than  3,000 mg per day      Day 6    Time  Name of Medication Number of pills taken  Amount of Acetaminophen   Pain Level  Comments  AM PM       AM PM       AM PM       AM PM       AM PM       AM PM       AM PM       AM PM       Total Daily amount of Acetaminophen  Do not take more than  3,000 mg per day      Day 7    Time  Name of Medication Number of pills taken  Amount of Acetaminophen   Pain Level   Comments  AM PM       AM PM       AM PM       AM PM       AM PM       AM PM       AM PM        AM PM       Total Daily amount of Acetaminophen  Do not take more than  3,000 mg per day        For additional information about how and where to safely dispose of unused opioid medications -  prankcrew.uy  Disclaimer: This document contains information and/or instructional materials adapted from [facility] for the typical patient with your condition. It does not replace medical advice from your health care provider because your experience may differ from that of the typical patient. Talk to your health care provider if you have any questions about this document, your condition or your treatment plan. Adapted from [facility]

## 2024-02-22 ENCOUNTER — Encounter (HOSPITAL_COMMUNITY): Payer: Self-pay | Admitting: Surgery

## 2024-02-22 ENCOUNTER — Encounter: Payer: Self-pay | Admitting: Physician Assistant

## 2024-02-25 ENCOUNTER — Telehealth: Payer: Self-pay | Admitting: *Deleted

## 2024-02-25 NOTE — Telephone Encounter (Signed)
 Copied from CRM #[id number]. Topic: General - Other >> Feb 25, 2024  3:29 PM Deleta S wrote: Reason for CRM: would like to see if fmla paperwork is ready to be picked up. Please call patient back at the number on file. Also would like advice on follow up care

## 2024-02-26 LAB — SURGICAL PATHOLOGY

## 2024-02-26 NOTE — Telephone Encounter (Signed)
 Notified pt paperwork is not ready and will let her know when it is done.

## 2024-02-27 ENCOUNTER — Ambulatory Visit (INDEPENDENT_AMBULATORY_CARE_PROVIDER_SITE_OTHER): Admitting: Clinical

## 2024-02-27 ENCOUNTER — Telehealth: Payer: Self-pay | Admitting: Physician Assistant

## 2024-02-27 ENCOUNTER — Other Ambulatory Visit: Payer: Self-pay | Admitting: Family Medicine

## 2024-02-27 DIAGNOSIS — F4323 Adjustment disorder with mixed anxiety and depressed mood: Secondary | ICD-10-CM | POA: Diagnosis not present

## 2024-02-27 DIAGNOSIS — Z0279 Encounter for issue of other medical certificate: Secondary | ICD-10-CM

## 2024-02-27 NOTE — Progress Notes (Addendum)
 [facility] Counselor Progress Note - TELEMEDICINE VISIT  Patient ID: Melanie [name], MRN: [id number]    Date: 02/27/24  Time Spent: 2:05pm   - 2:59pm  : 54 Minutes  Types of Service: Individual psychotherapy and Video visit  Client and/or Legal Guardian location: Work- [place], [place] Therapist location: [facility] [employer] - [place], [place] All persons participating in visit: Client & this therapist  I connected with client and/or legal guardian via Engineer, Civil (consulting)  (Video is Surveyor, mining) and verified that I am speaking with the correct person using two identifiers. Discussed confidentiality: Yes   I discussed the limitations of telemedicine and the availability of in person appointments.  Discussed there is a possibility of technology failure and discussed alternative modes of communication if that failure occurs.  I discussed that engaging in this telemedicine visit, they consent to the provision of behavioral healthcare and the services will be billed under their insurance.  Client and/or legal guardian expressed understanding and consented to Telemedicine visit: Yes    Presenting Concerns:  -ongoing health concerns and family stressors  Mental Status Exam: Appearance:  Neat     Behavior: Appropriate  Motor: Normal  Speech/Language:  Normal Rate  Affect: Appropriate  Mood: normal  Thought process: normal  Thought content:   WNL  Sensory/Perceptual disturbances:   WNL  Orientation: oriented to person, place, time/date, situation, and day of week  Attention: Good  Concentration: Good  Memory: WNL  Fund of knowledge:  Good  Insight:   Good  Judgment:  Good  Impulse Control: Good   Risk Assessment: Danger to Self:  No Self-injurious Behavior: No Danger to Others: No Duty to Warn:no   Subjective:  Melanie [name] ongoing concerns about her health although Melanie [name] it's not cancer.    Interventions:  Insight-Oriented- Reflective listening and psycho education on stress responses - connection between thoughts & physical reactions.  Client Response: Melanie [name] Melanie [name] has a sense of relief that Melanie [name] does not have cancer although Melanie [name] still does not know what is the reason why Melanie [name] physically feels the way Melanie [name] does now.  Melanie [name] what Melanie [name] is looking forward to do with her family for Christmas.  Melanie [name] plans to visit her daughter that is in a group home.  Melanie [name] more about her experiences with her daughter, who has needed ongoing mental health treatment, including psychiatric residential facilities throughout the years. As Melanie [name] more challenging times, Melanie [name] appeared more tense. Melanie [name] was open to exploring how her thoughts and physical responses are connected and being more mindful about it. Melanie [name] was open to stretching and deep breathing to help with relaxing her body.  Diagnosis:  Adjustment disorder with mixed anxiety and depressed mood   Goals, Assessment & Plan:   Melanie [name] a sense of relief after learning Melanie [name] doesn't have cancer.  Melanie [name] more information about herself and family relationships that have been challenging for many years.  Chronic psychosocial stressors, including the ongoing demands of caring for children with special needs, appear to be contributing factors to her current emotional and physical health concerns.  Treatment Level 1-3 times a month  Target Date: 10/11/2024   Modality - TeleMedicine - Video   Goals: Improve coping skills by learning effective ways to adapt and manage anxiety & depressive symptoms.   Objective: Build resilience and learn to adapt to stress and hardships.  - Melanie [name] is looking forward to experiencing the holidays since Melanie [name] enjoys doing that  and leaning on her husband for support.  Melanie [name] will also practice physical strategies to relax her body, eg stretching, movement, deep breathing   Melanie [name] P. Trudy, MSW, LCSW [employer] Therapist Main Office: [phone]

## 2024-02-27 NOTE — Progress Notes (Signed)
 Error

## 2024-02-28 ENCOUNTER — Other Ambulatory Visit: Payer: Self-pay | Admitting: Family Medicine

## 2024-02-28 ENCOUNTER — Other Ambulatory Visit: Payer: Self-pay | Admitting: Physician Assistant

## 2024-02-28 ENCOUNTER — Other Ambulatory Visit: Payer: Self-pay | Admitting: Adult Health

## 2024-02-28 DIAGNOSIS — G43709 Chronic migraine without aura, not intractable, without status migrainosus: Secondary | ICD-10-CM

## 2024-02-28 DIAGNOSIS — F419 Anxiety disorder, unspecified: Secondary | ICD-10-CM

## 2024-02-28 DIAGNOSIS — F418 Other specified anxiety disorders: Secondary | ICD-10-CM

## 2024-02-28 MED ORDER — PREDNISONE 20 MG PO TABS: ORAL_TABLET | ORAL | 0 refills | Status: DC

## 2024-02-28 NOTE — Telephone Encounter (Signed)
 I called and spoke to Melanie [name] to review the lymph node biopsy results. 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  Dr. Federico recommend no further oncologic workup and a consultation with rheumatology. She is currently scheduled with Rheumatology in March 2026. I will reach out to the team if it can be expedited since patient is symptomatic with progressive fatigue and pain. If we are unable to move up the appt, Dr. Federico recommends a steroid pulse to see if symptoms improve. I will follow up with the patient once I have an update regarding her rheumatology consult appt. Ms. [name] expressed understanding of the plan provided.

## 2024-02-29 ENCOUNTER — Encounter: Payer: Self-pay | Admitting: Medical Oncology

## 2024-02-29 NOTE — Progress Notes (Signed)
 [facility]  Spoke with patient and informed her, Per Johnston RIGGERS, prednisone  rx sent to her listed pharmacy and that Johnston will follow-up with her next week to see if her symptoms have improved. Patient states that she was not notified by her pharmacy of rx but that she will call them today. Patient also informed that Rheumatology has placed her on a cancellation list so if they have any cancellations between now and her scheduled appt they will call her for a sooner appointment. Patient expressed understanding and denies questions at this time. Patient thanked and encouraged to call with questions/concerns.   Melanie KYM Raider, RN, BSN Oncology Nurse Navigator, [facility] 02/29/2024 12:01 PM

## 2024-03-08 ENCOUNTER — Other Ambulatory Visit: Payer: Self-pay | Admitting: Adult Health

## 2024-03-08 ENCOUNTER — Other Ambulatory Visit: Payer: Self-pay | Admitting: Family Medicine

## 2024-03-08 DIAGNOSIS — F419 Anxiety disorder, unspecified: Secondary | ICD-10-CM

## 2024-03-08 DIAGNOSIS — E559 Vitamin D deficiency, unspecified: Secondary | ICD-10-CM

## 2024-03-08 DIAGNOSIS — F418 Other specified anxiety disorders: Secondary | ICD-10-CM

## 2024-03-08 DIAGNOSIS — G43709 Chronic migraine without aura, not intractable, without status migrainosus: Secondary | ICD-10-CM

## 2024-03-10 ENCOUNTER — Other Ambulatory Visit: Payer: Self-pay | Admitting: Physician Assistant

## 2024-03-10 MED ORDER — PREDNISONE 10 MG PO TABS: ORAL_TABLET | ORAL | 0 refills | Status: AC

## 2024-03-11 ENCOUNTER — Other Ambulatory Visit: Payer: Self-pay

## 2024-03-11 DIAGNOSIS — F419 Anxiety disorder, unspecified: Secondary | ICD-10-CM

## 2024-03-11 DIAGNOSIS — F418 Other specified anxiety disorders: Secondary | ICD-10-CM

## 2024-03-11 MED ORDER — BUSPIRONE HCL 15 MG PO TABS: 15.0000 mg | ORAL_TABLET | ORAL | 1 refills | Status: AC

## 2024-03-11 NOTE — Progress Notes (Unsigned)
 [facility] Counselor Progress Note - TELEMEDICINE VISIT  Patient ID: SHOUA ULLOA, MRN: [id number]    Date: 03/12/2024  Time Spent:  *** Minutes  Types of Service: Individual psychotherapy and Video visit  Client and/or Legal Guardian location: Work- [place], [place] Therapist location: Remote work - [place], [place] All persons participating in visit: Client & this therapist  I connected with client and/or legal guardian via Engineer, Civil (consulting)  (Video is Surveyor, mining) and verified that I am speaking with the correct person using two identifiers. Discussed confidentiality: Yes   I discussed the limitations of telemedicine and the availability of in person appointments.  Discussed there is a possibility of technology failure and discussed alternative modes of communication if that failure occurs.  I discussed that engaging in this telemedicine visit, they consent to the provision of behavioral healthcare and the services will be billed under their insurance.  Client and/or legal guardian expressed understanding and consented to Telemedicine visit: Yes    Presenting Concerns:  ***  Mental Status Exam: Appearance:  Neat     Behavior: Appropriate  Motor: Normal  Speech/Language:  Normal Rate  Affect: Appropriate  Mood: normal  Thought process: normal  Thought content:   WNL  Sensory/Perceptual disturbances:   WNL  Orientation: oriented to person, place, time/date, situation, and day of week  Attention: Good  Concentration: Good  Memory: WNL  Fund of knowledge:  Good  Insight:   Good  Judgment:  Good  Impulse Control: Good   Risk Assessment: Danger to Self:  No Self-injurious Behavior: No Danger to Others: No Duty to Warn:no   Subjective:  ***  Interventions: ***  Insight-Oriented- Reflective listening and psycho education on stress responses - connection between thoughts & physical reactions.  Client  Response:  ***  Diagnosis:  No diagnosis found.   Goals, Assessment & Plan:   Treatment Level 1-3 times a month  Target Date: 10/11/2024   Modality - TeleMedicine - Video   Goals: Improve coping skills by learning effective ways to adapt and manage anxiety & depressive symptoms.   Objective: Build resilience and learn to adapt to stress and hardships.  ***  - Talani is looking forward to experiencing the holidays since she enjoys doing that and leaning on her husband for support.  Rufus Cypert will also practice physical strategies to relax her body, eg stretching, movement, deep breathing  ***  Kennett Symes P. Trudy, MSW, LCSW [employer] Therapist Main Office: [phone]

## 2024-03-12 ENCOUNTER — Ambulatory Visit: Admitting: Clinical

## 2024-03-15 ENCOUNTER — Other Ambulatory Visit: Payer: Self-pay | Admitting: Family Medicine

## 2024-03-15 DIAGNOSIS — J3089 Other allergic rhinitis: Secondary | ICD-10-CM

## 2024-03-31 ENCOUNTER — Other Ambulatory Visit: Payer: Self-pay | Admitting: Adult Health

## 2024-03-31 DIAGNOSIS — G43709 Chronic migraine without aura, not intractable, without status migrainosus: Secondary | ICD-10-CM

## 2024-04-01 NOTE — Telephone Encounter (Signed)
 Called and scheduled pt for a year f/u with first available. Please call pt's medication in.

## 2024-04-09 ENCOUNTER — Telehealth: Payer: Self-pay

## 2024-04-09 NOTE — Telephone Encounter (Signed)
 T/C to pt to follow up on how she is doing taking prednisone  while waiting to see a Rheumatologist in March.  Pt said her fatigue has improved but she is still having the dizziness, weakness and shaking. She is managing her day to day activities.  Her current dose of prednisone  is 10 mg PO daily

## 2024-04-21 ENCOUNTER — Ambulatory Visit: Admitting: Family Medicine

## 2024-05-07 ENCOUNTER — Ambulatory Visit: Payer: Self-pay | Admitting: Neurology

## 2024-05-28 ENCOUNTER — Ambulatory Visit: Admitting: Rheumatology

## 2024-07-09 ENCOUNTER — Ambulatory Visit: Admitting: Rheumatology
